# Patient Record
Sex: Male | Born: 1935 | Race: White | Hispanic: No | Marital: Married | Smoking: Never smoker
Health system: Southern US, Community
[De-identification: ages and names within clinical notes are randomized; demographics above are authoritative.]

## PROBLEM LIST (undated history)

## (undated) DIAGNOSIS — I2510 Atherosclerotic heart disease of native coronary artery without angina pectoris: Secondary | ICD-10-CM

## (undated) DIAGNOSIS — I5022 Chronic systolic (congestive) heart failure: Secondary | ICD-10-CM

## (undated) DIAGNOSIS — I714 Abdominal aortic aneurysm, without rupture, unspecified: Secondary | ICD-10-CM

## (undated) DIAGNOSIS — E785 Hyperlipidemia, unspecified: Secondary | ICD-10-CM

## (undated) DIAGNOSIS — K5090 Crohn's disease, unspecified, without complications: Secondary | ICD-10-CM

## (undated) DIAGNOSIS — I10 Essential (primary) hypertension: Secondary | ICD-10-CM

## (undated) DIAGNOSIS — R001 Bradycardia, unspecified: Secondary | ICD-10-CM

## (undated) DIAGNOSIS — R55 Syncope and collapse: Secondary | ICD-10-CM

## (undated) DIAGNOSIS — I255 Ischemic cardiomyopathy: Secondary | ICD-10-CM

## (undated) DIAGNOSIS — I739 Peripheral vascular disease, unspecified: Secondary | ICD-10-CM

## (undated) DIAGNOSIS — R000 Tachycardia, unspecified: Secondary | ICD-10-CM

## (undated) DIAGNOSIS — I701 Atherosclerosis of renal artery: Secondary | ICD-10-CM

## (undated) DIAGNOSIS — I472 Ventricular tachycardia, unspecified: Secondary | ICD-10-CM

## (undated) DIAGNOSIS — M109 Gout, unspecified: Secondary | ICD-10-CM

## (undated) DIAGNOSIS — I6529 Occlusion and stenosis of unspecified carotid artery: Secondary | ICD-10-CM

## (undated) DIAGNOSIS — I4510 Unspecified right bundle-branch block: Secondary | ICD-10-CM

## (undated) DIAGNOSIS — J449 Chronic obstructive pulmonary disease, unspecified: Secondary | ICD-10-CM

## (undated) HISTORY — DX: Chronic systolic (congestive) heart failure: I50.22

## (undated) HISTORY — DX: Occlusion and stenosis of unspecified carotid artery: I65.29

## (undated) HISTORY — DX: Bradycardia, unspecified: R00.1

## (undated) HISTORY — PX: EYE SURGERY: SHX253

## (undated) HISTORY — DX: Ischemic cardiomyopathy: I25.5

## (undated) HISTORY — PX: COLON SURGERY: SHX602

## (undated) HISTORY — DX: Chronic obstructive pulmonary disease, unspecified: J44.9

## (undated) HISTORY — DX: Essential (primary) hypertension: I10

## (undated) HISTORY — DX: Atherosclerotic heart disease of native coronary artery without angina pectoris: I25.10

## (undated) HISTORY — DX: Atherosclerosis of renal artery: I70.1

## (undated) HISTORY — DX: Crohn's disease, unspecified, without complications: K50.90

## (undated) HISTORY — PX: CORONARY ANGIOPLASTY: SHX604

## (undated) HISTORY — PX: TONSILLECTOMY: SUR1361

## (undated) HISTORY — DX: Hyperlipidemia, unspecified: E78.5

## (undated) HISTORY — PX: CATARACT EXTRACTION, BILATERAL: SHX1313

## (undated) HISTORY — PX: CORONARY ARTERY BYPASS GRAFT: SHX141

---

## 1985-10-09 HISTORY — PX: PR VEIN BYPASS GRAFT,AORTO-FEM-POP: 35551

## 2002-08-09 ENCOUNTER — Encounter: Payer: Self-pay | Admitting: Cardiology

## 2002-08-28 ENCOUNTER — Ambulatory Visit (HOSPITAL_COMMUNITY): Admission: RE | Admit: 2002-08-28 | Discharge: 2002-08-29 | Payer: Self-pay | Admitting: Cardiology

## 2002-09-11 ENCOUNTER — Ambulatory Visit (HOSPITAL_COMMUNITY): Admission: RE | Admit: 2002-09-11 | Discharge: 2002-09-11 | Payer: Self-pay | Admitting: *Deleted

## 2005-08-14 ENCOUNTER — Emergency Department (HOSPITAL_COMMUNITY): Admission: EM | Admit: 2005-08-14 | Discharge: 2005-08-14 | Payer: Self-pay | Admitting: Family Medicine

## 2005-09-02 ENCOUNTER — Emergency Department (HOSPITAL_COMMUNITY): Admission: EM | Admit: 2005-09-02 | Discharge: 2005-09-02 | Payer: Self-pay | Admitting: Family Medicine

## 2006-05-11 HISTORY — PX: COLON RESECTION: SHX5231

## 2006-07-26 ENCOUNTER — Inpatient Hospital Stay (HOSPITAL_BASED_OUTPATIENT_CLINIC_OR_DEPARTMENT_OTHER): Admission: RE | Admit: 2006-07-26 | Discharge: 2006-07-26 | Payer: Self-pay | Admitting: Cardiology

## 2006-07-28 ENCOUNTER — Encounter: Payer: Self-pay | Admitting: Cardiology

## 2006-08-13 ENCOUNTER — Ambulatory Visit: Payer: Self-pay | Admitting: Cardiothoracic Surgery

## 2006-08-19 ENCOUNTER — Ambulatory Visit: Payer: Self-pay | Admitting: Cardiothoracic Surgery

## 2006-08-19 ENCOUNTER — Inpatient Hospital Stay (HOSPITAL_COMMUNITY): Admission: RE | Admit: 2006-08-19 | Discharge: 2006-09-04 | Payer: Self-pay | Admitting: Cardiothoracic Surgery

## 2006-08-26 ENCOUNTER — Encounter (INDEPENDENT_AMBULATORY_CARE_PROVIDER_SITE_OTHER): Payer: Self-pay | Admitting: *Deleted

## 2006-09-28 ENCOUNTER — Ambulatory Visit: Payer: Self-pay | Admitting: Cardiothoracic Surgery

## 2006-10-14 ENCOUNTER — Encounter (HOSPITAL_COMMUNITY): Admission: RE | Admit: 2006-10-14 | Discharge: 2007-01-12 | Payer: Self-pay | Admitting: Cardiology

## 2007-01-13 ENCOUNTER — Encounter (HOSPITAL_COMMUNITY): Admission: RE | Admit: 2007-01-13 | Discharge: 2007-02-08 | Payer: Self-pay | Admitting: Cardiology

## 2007-05-26 ENCOUNTER — Ambulatory Visit: Payer: Self-pay | Admitting: *Deleted

## 2008-04-12 ENCOUNTER — Ambulatory Visit: Payer: Self-pay | Admitting: *Deleted

## 2008-05-11 HISTORY — PX: CAROTID ENDARTERECTOMY: SUR193

## 2008-05-14 ENCOUNTER — Inpatient Hospital Stay (HOSPITAL_COMMUNITY): Admission: AD | Admit: 2008-05-14 | Discharge: 2008-05-17 | Payer: Self-pay | Admitting: Cardiology

## 2008-10-16 ENCOUNTER — Emergency Department (HOSPITAL_COMMUNITY): Admission: EM | Admit: 2008-10-16 | Discharge: 2008-10-16 | Payer: Self-pay | Admitting: Family Medicine

## 2008-11-01 ENCOUNTER — Ambulatory Visit: Payer: Self-pay | Admitting: *Deleted

## 2008-11-20 ENCOUNTER — Inpatient Hospital Stay (HOSPITAL_COMMUNITY): Admission: RE | Admit: 2008-11-20 | Discharge: 2008-11-22 | Payer: Self-pay | Admitting: *Deleted

## 2008-11-20 ENCOUNTER — Encounter (INDEPENDENT_AMBULATORY_CARE_PROVIDER_SITE_OTHER): Payer: Self-pay | Admitting: *Deleted

## 2008-11-20 ENCOUNTER — Ambulatory Visit: Payer: Self-pay | Admitting: *Deleted

## 2008-12-06 ENCOUNTER — Ambulatory Visit: Payer: Self-pay | Admitting: *Deleted

## 2010-02-11 ENCOUNTER — Ambulatory Visit: Payer: Self-pay | Admitting: Cardiology

## 2010-03-25 ENCOUNTER — Ambulatory Visit: Payer: Self-pay | Admitting: Cardiology

## 2010-04-24 ENCOUNTER — Ambulatory Visit: Payer: Self-pay | Admitting: Cardiology

## 2010-08-17 LAB — COMPREHENSIVE METABOLIC PANEL
ALT: 21 U/L (ref 0–53)
Albumin: 3.9 g/dL (ref 3.5–5.2)
Alkaline Phosphatase: 81 U/L (ref 39–117)
Calcium: 9.3 mg/dL (ref 8.4–10.5)
Glucose, Bld: 100 mg/dL — ABNORMAL HIGH (ref 70–99)
Potassium: 4.5 mEq/L (ref 3.5–5.1)
Sodium: 139 mEq/L (ref 135–145)
Total Protein: 6.5 g/dL (ref 6.0–8.3)

## 2010-08-17 LAB — BASIC METABOLIC PANEL
CO2: 26 mEq/L (ref 19–32)
Calcium: 8.4 mg/dL (ref 8.4–10.5)
Creatinine, Ser: 1.36 mg/dL (ref 0.4–1.5)
GFR calc non Af Amer: 51 mL/min — ABNORMAL LOW (ref >60)
Glucose, Bld: 139 mg/dL — ABNORMAL HIGH (ref 70–99)

## 2010-08-17 LAB — CBC
MCHC: 34.6 g/dL (ref 30.0–36.0)
MCHC: 34.8 g/dL (ref 30.0–36.0)
MCV: 101 fL — ABNORMAL HIGH (ref 78.0–100.0)
MCV: 99.4 fL (ref 78.0–100.0)
Platelets: 114 10*3/uL — ABNORMAL LOW (ref 150–400)
Platelets: 140 10*3/uL — ABNORMAL LOW (ref 150–400)
RBC: 4.02 MIL/uL — ABNORMAL LOW (ref 4.22–5.81)
RDW: 13.1 % (ref 11.5–15.5)
RDW: 13.4 % (ref 11.5–15.5)

## 2010-08-17 LAB — URINALYSIS, ROUTINE W REFLEX MICROSCOPIC
Ketones, ur: NEGATIVE mg/dL
Nitrite: NEGATIVE
Protein, ur: NEGATIVE mg/dL
pH: 6 (ref 5.0–8.0)

## 2010-08-17 LAB — TYPE AND SCREEN: ABO/RH(D): O NEG

## 2010-08-17 LAB — APTT: aPTT: 33 seconds (ref 24–37)

## 2010-08-17 LAB — CARDIAC PANEL(CRET KIN+CKTOT+MB+TROPI): Total CK: 97 U/L (ref 7–232)

## 2010-08-25 LAB — COMPREHENSIVE METABOLIC PANEL
AST: 30 U/L (ref 0–37)
BUN: 17 mg/dL (ref 6–23)
CO2: 28 mEq/L (ref 19–32)
Chloride: 105 mEq/L (ref 96–112)
Creatinine, Ser: 1.19 mg/dL (ref 0.4–1.5)
GFR calc non Af Amer: 59 mL/min — ABNORMAL LOW (ref >60)
Glucose, Bld: 94 mg/dL (ref 70–99)
Total Bilirubin: 0.6 mg/dL (ref 0.3–1.2)

## 2010-08-25 LAB — BASIC METABOLIC PANEL
BUN: 21 mg/dL (ref 6–23)
Calcium: 8.7 mg/dL (ref 8.4–10.5)
GFR calc non Af Amer: 51 mL/min — ABNORMAL LOW (ref >60)
Glucose, Bld: 89 mg/dL (ref 70–99)
Potassium: 4.5 mEq/L (ref 3.5–5.1)
Sodium: 138 mEq/L (ref 135–145)

## 2010-08-25 LAB — CARDIAC PANEL(CRET KIN+CKTOT+MB+TROPI)
Relative Index: 3.3 — ABNORMAL HIGH (ref 0.0–2.5)
Relative Index: 3.5 — ABNORMAL HIGH (ref 0.0–2.5)
Relative Index: 3.5 — ABNORMAL HIGH (ref 0.0–2.5)
Total CK: 105 U/L (ref 7–232)
Total CK: 115 U/L (ref 7–232)
Troponin I: 0.02 ng/mL (ref 0.00–0.06)

## 2010-08-25 LAB — URINALYSIS, ROUTINE W REFLEX MICROSCOPIC
Glucose, UA: NEGATIVE mg/dL
Hgb urine dipstick: NEGATIVE
Protein, ur: NEGATIVE mg/dL

## 2010-08-25 LAB — CBC
HCT: 41.3 % (ref 39.0–52.0)
HCT: 41.5 % (ref 39.0–52.0)
Hemoglobin: 14.1 g/dL (ref 13.0–17.0)
Hemoglobin: 14.2 g/dL (ref 13.0–17.0)
Platelets: 119 10*3/uL — ABNORMAL LOW (ref 150–400)
Platelets: 126 10*3/uL — ABNORMAL LOW (ref 150–400)
Platelets: 128 10*3/uL — ABNORMAL LOW (ref 150–400)
RBC: 4.29 MIL/uL (ref 4.22–5.81)
RDW: 12.9 % (ref 11.5–15.5)
RDW: 13 % (ref 11.5–15.5)
WBC: 6.5 10*3/uL (ref 4.0–10.5)
WBC: 7.1 10*3/uL (ref 4.0–10.5)
WBC: 7.2 10*3/uL (ref 4.0–10.5)

## 2010-08-25 LAB — PROTIME-INR: INR: 1 (ref 0.00–1.49)

## 2010-08-25 LAB — HEPARIN LEVEL (UNFRACTIONATED): Heparin Unfractionated: 0.68 IU/mL (ref 0.30–0.70)

## 2010-08-25 LAB — GLUCOSE, CAPILLARY: Glucose-Capillary: 136 mg/dL — ABNORMAL HIGH (ref 70–99)

## 2010-09-23 NOTE — Discharge Summary (Signed)
NAMEAVEREY, TROMPETER NO.:  [id number]   MEDICAL RECORD NO.:  [id number]          PATIENT TYPE:  INP   LOCATION:  2011                         FACILITY:  [facility]   PHYSICIAN:  Colleen Can. Deborah Chalk, M.D.DATE OF BIRTH:  [birth year]   DATE OF ADMISSION:  05/14/2008  DATE OF DISCHARGE:  05/17/2008                               DISCHARGE SUMMARY   DISCHARGE DIAGNOSES:  1. Chest pain with subsequent cardiac catheterization, per Dr. Lady Deutscher, bypass grafts were noted to be patent.  He has had negative      CT scan.  He will continue with medical management.  2. Known ischemic heart disease with original myocardial infarction in      1969, subsequent bypass surgery x3 in 1987 with redo surgery in      April 2008.  3. History of postoperative atrial fibrillation, treated with a short      course of amiodarone without recurrence.  4. Postoperative colon resection in 2008 secondary to Crohn disease.  5. History of bradycardia secondary to beta-blocker.  6. Hypertension.  7. Hyperlipidemia.  8. Vasculopathy.  9. Sexual dysfunction.  His Viagra will now need to be discontinued.  10.Past history of anemia.   HISTORY OF PRESENT ILLNESS:  The patient is a 77 year old white male who  has multiple medical problems who presented to the office with a 10-day  history of chest discomfort.  He was subsequently seen and was admitted  for further evaluation.   Please see dictated history and physical for further patient  presentation and profile.   LABORATORY DATA ON ADMISSION:  CBC was within normal limits.  Chemistries were normal.  Cardiac enzymes were all negative.   HOSPITAL COURSE:  The patient was admitted electively.  He was started  on IV nitroglycerin and IV heparin.  His pain subsequently was resolved.  He proceeded on with cardiac catheterization the following day per Dr.  Lady Deutscher.  Please see that full dictated report for full details.  Basically, the  bypass grafts are noted to be patent and he will need to  continue with aggressive medical management.  Postprocedure, he was  transferred back to 1000, IV medications were discontinued, Ranexa was  initiated.  Unfortunately, he continued to have chest tightness that  returned the following morning.  Long-acting nitrates as well as  anticoagulants with Plavix was initiated.  CT scan was also performed,  which showed no acute changes.  Today on May 17, 2008, he is doing  well without complaints.  He had had some previous trouble with  hypotension the previous night of which he was asymptomatic.  Medicines  have been adjusted accordingly, and overall, he is felt to be a  satisfactory candidate for discharge today.   DISCHARGE CONDITION:  Stable.   DISCHARGE DIET:  Low salt, heart healthy.   DISCHARGE MEDICINES:  1. Lipitor 10 mg a day.  2. Baby aspirin daily.  3. Allegra 180 a day.  4. Niaspan 500 mg a day.  5. Multivitamin daily.  6. Lysine daily.  7. Protonix 40 mg a  day.  8. Ranexa 500 b.i.d.  9. Imdur 60 mg a day.  10.Plavix 75 mg a day.   We will, at this point in time, hold his Diovan, and he is not to take  any further Viagra.  We will plan on seeing him back in the office early  next week.  He is asked to call to schedule that appointment, certainly  sooner if any problems arise in the interim.      Sharlee Blew, N.P.      Colleen Can. Deborah Chalk, M.D.  Electronically Signed    LC/MEDQ  D:  05/17/2008  T:  05/17/2008  Job:  [id number]

## 2010-09-23 NOTE — Procedures (Signed)
CAROTID DUPLEX EXAM   INDICATION:  Followup evaluation of known carotid artery disease.   HISTORY:  Diabetes:  No.  Cardiac:  CABG.  Hypertension:  Yes.  Smoking:  No.  Previous Surgery:  No.  CV History:  No.  Amaurosis Fugax No, Paresthesias No, Hemiparesis No                                       RIGHT             LEFT  Brachial systolic pressure:         116               122  Brachial Doppler waveforms:         Biphasic          Biphasic  Vertebral direction of flow:        Antegrade         Antegrade  DUPLEX VELOCITIES (cm/sec)  CCA peak systolic                   122               110  ECA peak systolic                   240               236  ICA peak systolic                   198               397  ICA end diastolic                   34                127  PLAQUE MORPHOLOGY:                  Heterogeneous     Heterogeneous  PLAQUE AMOUNT:                      Moderate          Severe  PLAQUE LOCATION:                    Bif/ICA/ECA       Bif/ICA/ECA   IMPRESSION:  1. 20-39% right internal carotid artery stenosis.  2. 80-99% left internal carotid artery stenosis.        ___________________________________________  P. Liliane Bade, M.D.   AC/MEDQ  D:  11/01/2008  T:  11/01/2008  Job:  [id number]

## 2010-09-23 NOTE — H&P (Signed)
NAMEZACHERIAH, STUMPE NO.:  [id number]   MEDICAL RECORD NO.:  [id number]          PATIENT TYPE:  INP   LOCATION:  2924                         FACILITY:  [facility]   PHYSICIAN:  Colleen Can. Deborah Chalk, M.D.DATE OF BIRTH:  1927-01-17   DATE OF ADMISSION:  05/14/2008  DATE OF DISCHARGE:                              HISTORY & PHYSICAL   CHIEF COMPLAINT:  Chest pain.   HISTORY OF PRESENT ILLNESS:  Mr. Noecker is a very pleasant 77 year old  white male who has a known history of ischemic heart disease.  He has  had redo coronary artery bypass grafting in April of 2008.  He presents  to the office as a work-in appointment with a 10-day history of chest  discomfort.  He notes that ever since he has had his redo surgery almost  2 years ago he has had some soreness throughout the chest wall that  really has not bothered him.  He notes that moves around and it is  clearly nonexertional and it feels like it is more musculoskeletal.  Approximately 10 days ago he thought he had the onset of this again,  however, this time it is midsternal, it is a nagging type discomfort, it  is worse with his activities of daily living and is relieved with rest.  He has used nitroglycerin on two different occasions with some relief  yet it would only return.  It does feel similar to his previous chest  pain syndrome.  He was seen in the office as a work-in appointment and  had been complaining of this discomfort for the past several hours.  He  was given nitroglycerin here in the office with really no improvement.  He is subsequently admitted now for further evaluation.   PAST MEDICAL HISTORY:  1. Known ischemic heart disease.  He had his original myocardial      infarction (apical) in 1969.  He had subsequent coronary artery      bypass grafting x3 in 1987, at that time had a vein graft to the      diagonal and marginal coronaries with a left internal mammary to      the LAD.  He had repeat  catheterization in March of 2008 and      subsequently underwent redo coronary artery bypass grafting x3 with      a left radial artery graft to the LAD, sequential saphenous vein      graft to the OM1 and OM2 per Dr. Kathlee Nations Trigt in April of 2008.  2. Postoperative atrial fibrillation.  This was short-term and treated      with a short course of amiodarone.  3. Postoperative colon resection secondary to Crohn's disease.  4. History of bradycardia secondary to beta blocker.  5. Hypertension.  6. Hyperlipidemia.  7. Vasculopathy with multiple bruits, he is followed by Dr. Liliane Bade.  He has most recently had carotid duplex study in December      of 2009.  He has a 1% - 39% stenosis on the right and a 60% - 80%  stenosis on the left.  8. Sexual dysfunction requiring Viagra.  9. Past history of anemia.   ALLERGIES:  NOVOCAIN.   CURRENT MEDICINES:  1. Lipitor 10 mg a day.  2. Baby aspirin daily.  3. Diovan 80 mg a day.  4. Nitroglycerin p.r.n.  5. Viagra p.r.n.  6. Allegra 180 a day.  7. Niaspan 500 a day.  8. Lysine daily.  9. Multivitamin daily.   FAMILY HISTORY:  Both of his parents are deceased.  Father died at 62  with heart disease and emphysema.  Mother died at age 79 with lung  problems.   SOCIAL HISTORY:  He is married.  He lives at home with his wife.  He has  no current alcohol or tobacco use.   REVIEW OF SYSTEMS:  He has had no recent fever, flu or cough.  He is not  short of breath.  His energy level has remained good.  His chest  discomfort is as described above.  He has had no GI upset, no  constipation or diarrhea.  All other review of systems are negative.   EXAM:  He is a very pleasant white male, he is in no acute distress but  he is still complaining of midsternal chest discomfort.  Blood pressure  is 112/72, his heart rate initially in the 70s but then up to 130s  during the exam, respirations are 20, he is afebrile.  HEENT:  He is  normocephalic/atraumatic.  NECK:  Supple, no JVD.  LUNGS:  Clear.  CARDIAC:  Shows a regular rhythm, there is no chest wall tenderness.  ABDOMEN:  Soft, positive bowel sounds, nontender.  EXTREMITIES:  Without edema.  NEUROLOGIC:  Shows no gross focal deficits.   His EKG initially showed sinus rhythm with a heart rate in the 70s with  evidence of an old anterior MI.  A repeat EKG when he was noted to be  tachycardic showed a rate of 108 with no further changes.  His other  labs are pending.   OVERALL IMPRESSION:  1. Persistent chest discomfort.  2. Extensive history of ischemic heart disease.  3. Vasculopathy.  4. Hypertension.  5. Hyperlipidemia.  6. History of redo coronary artery bypass grafting last in April of      2008.  7. Postoperative colon resection secondary to Crohn's disease.  8. Remote history of atrial fibrillation.  9. History of bradycardia secondary to beta blocker use.   PLAN:  He is admitted to the hospital.  He will be placed on IV heparin  and IV nitroglycerin.  Home medicines will be continued.  For now will  hold off on starting beta blockers.  We will watch his rhythm on the  monitor.  We will do serial cardiac enzymes.  We will more than likely  proceed on with cardiac catheterization on Tuesday.  The further  treatment plan to follow per Dr. Deborah Chalk and Dr. Silva Bandy discretion.      Sharlee Blew, N.P.      Colleen Can. Deborah Chalk, M.D.  Electronically Signed    LC/MEDQ  D:  05/14/2008  T:  05/14/2008  Job:  [id number]

## 2010-09-23 NOTE — Op Note (Signed)
Karl Stevenson NO.:  [id number]   MEDICAL RECORD NO.:  [id number]          PATIENT TYPE:  INP   LOCATION:  3308                         FACILITY:  [facility]   PHYSICIAN:  Balinda Quails, M.D.    DATE OF BIRTH:  1927-03-14   DATE OF PROCEDURE:  11/20/2008  DATE OF DISCHARGE:                               OPERATIVE REPORT   SURGEON:  Balinda Quails, MD   ASSISTANT:  Della Goo, PA-C   ANESTHETIC:  General endotracheal.   ANESTHESIOLOGIST:  Maren Beach, MD   PREOPERATIVE DIAGNOSIS:  Severe progressive left internal carotid artery  stenosis.   POSTOPERATIVE DIAGNOSIS:  Severe progressive left internal carotid  artery stenosis.   PROCEDURE:  Left carotid endarterectomy with Dacron patch angioplasty.   CLINICAL NOTE:  [name] is a 77 year old gentleman followed for  several years in the office with known carotid disease.  He has had  marked progression of his left internal carotid artery stenosis with  increase in velocities consistent with a severe left internal carotid  stenosis.   He was seen in the office and evaluated and consented to left carotid  endarterectomy for reduction of stroke risk.  Risks and benefits of the  operative procedure reviewed with the patient preoperatively, potential  complications including MI, CVA, cranial nerve injury, and death were  addressed.   OPERATIVE PROCEDURE:  The patient was brought to the operating room in  stable condition.  Placed under general endotracheal anesthesia.  Foley  catheter arterial line was in place.  Left neck prepped and draped in  sterile fashion.   Longitudinal skin incision made along the upper border of the left  sternomastoid muscle.  Subcutaneous tissue and platysma divided with  electrocautery.  Deep dissection carried along the anterior border of  the sternomastoid.  The facial vein ligated with 2-0 silk and divided.  The carotid bifurcation exposed.  The common carotid  artery mobilized  down to the omohyoid muscle and encircled proximally with a vessel loop.  The superior thyroid and external carotid origins freed and encircled  with vessel loops.  The internal carotid followed distally up to the  posterior belly of the digastric muscle, the hypoglossal nerve reflected  superiorly and preserved.  The distal internal carotid artery was free  of plaque and encircled with a vessel loop.  The carotid bifurcation  revealed calcified plaque disease.  The vagus nerve reflected  posteriorly and preserved.   The patient was administered 6000 units of heparin intravenously.  Adequate circulation time permitted.  The carotid vessels were  controlled with clamps.  Longitudinal arteriotomy made in the distal  common carotid artery.  The arteriotomy extended across the carotid bulb  and up into the internal carotid artery.  There was a moderate plaque in  the left carotid bifurcation and a high-grade stenosis at the origin of  left internal carotid artery.  Shunt was inserted.   The endarterectomy then completed with an elevator.  The endarterectomy  carried down to the common carotid artery with a plaque was divided  transversely with Potts scissors.  Plaque raised up into the bulb and  superior thyroid and external carotid were endarterectomized using an  eversion technique.  The distal internal carotid artery plaque feathered  out well.  Fragments of plaque removed with fine forceps.  The site was  irrigated with heparin, saline, and dextran solutions.   A patch angioplasty endarterectomy site was then carried out with a  running 6-0 Prolene suture using a Dacron patch.  At completion of the  patch angioplasty, the shunt was removed, all vessels well flushed.  Clamps removed directing initial antegrade flow up the external carotid  artery, the internal carotid then released.  Excellent pulse and Doppler  signal in the distal internal carotid artery.  The  patient administered  50 mg of protamine intravenously.  Adequate hemostasis obtained, sponge  and instrument counts correct.   The sternomastoid fascia closed with running 2-0 Vicryl suture.  Platysma closed with running 3-0 Vicryl suture.  Skin closed with 4-0  Monocryl.  Dermabond applied.   Anesthesia reversed in the operating room.  The patient awakened  readily, moved all extremities to command.  Transferred to recovery room  in stable condition.      Balinda Quails, M.D.  Electronically Signed     PGH/MEDQ  D:  11/20/2008  T:  11/20/2008  Job:  [id number]   cc:   Colleen Can. Deborah Chalk, M.D.

## 2010-09-23 NOTE — Cardiovascular Report (Signed)
NAMEYOVANI, COGBURN NO.:  [id number]   MEDICAL RECORD NO.:  [id number]          PATIENT TYPE:  INP   LOCATION:  2011                         FACILITY:  [facility]   PHYSICIAN:  Elmore Guise., M.D.DATE OF BIRTH:  06-Jun-1926   DATE OF PROCEDURE:  05/15/2008  DATE OF DISCHARGE:                            CARDIAC CATHETERIZATION   PRIMARY CARDIOLOGIST:  Colleen Can. Deborah Chalk, MD   INDICATION FOR CATHETERIZATION:  Increasing exertional angina.   DESCRIPTION OF PROCEDURE:  The patient was brought to the Cardiac Cath  Lab.  After appropriate informed consent, he was prepped and draped in  sterile fashion.  Approximately 5 mL of 1% lidocaine was used for local  anesthesia.  A 5-French sheath was placed in the right femoral artery  without difficulty.  Coronary angiography, LV angiography, aortic root  angiography, and abdominal aortic angiography were performed.  The  patient tolerated the procedure well with no apparent complications.   CONTRAST USED DURING THE PROCEDURE:  Approximately 180 mL.   FINDINGS:  1. Left Main:  Very small 80-90% narrowing throughout.  2. LAD:  A 100% proximal occlusion.  3. LCX:  A 100% proximal occlusion at prior stent.  4. RCA:  Small and nondominant.   VEIN GRAFTS:  1. Vein graft to OM/OM-2 (sequential) is patent without significant      disease.  There is a second branch extending from the vein graft      filling the mid and distal LAD and backfilling the proximal LAD and      the first diagonal.  2. Vein graft to OM-3 is patent with mild distal disease approximately      30-40%.  3. Left subclavian artery has a 40-50% proximal stenosis, but LIMA is      atretic.  4. LV:  EF is 40%.  LVEDP is 7 mmHg.  5. Aortic root shows mild proximal ectasia with no further bypass      graft seen.  6. Descending aortic root (abdominal aortic shot) shows an ectatic      vessel with mild diffuse disease.  There is a 70-80% left renal  artery stenosis noted.  Right and left iliacs are patent with      moderate diffuse disease noted.   IMPRESSION:  1. Severe obstructive native vessel disease.  2. Patent vein graft to first obtuse marginal/second obtuse marginal      (sequential graft).  Patent vein graft to left anterior descending      and patent vein graft to third obtuse marginal.  3. 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  4. A 70-80% stenosis of the left renal artery.  5. Moderate diffuse iliac and femoral atherosclerosis.   PLAN:  At this time, I would recommend aggressive medical therapy.  We  will add Ranexa 500 mg twice daily to his regimen.  We will discontinue  his nitroglycerin drip.  Hopefully, he will be able to be discharged in  the morning.      Elmore Guise., M.D.  Electronically Signed     TWK/MEDQ  D:  05/15/2008  T:  05/16/2008  Job:  [id number]   cc:   Colleen Can. Deborah Chalk, M.D.

## 2010-09-23 NOTE — Procedures (Signed)
CAROTID DUPLEX EXAM   INDICATION:  Followup carotid artery disease.   HISTORY:  Diabetes:  No.  Cardiac:  CABG.  Hypertension:  Yes.  Smoking:  No.  Previous Surgery:  No.  CV History:  No.  Amaurosis Fugax No, Paresthesias No, Hemiparesis No                                       RIGHT             LEFT  Brachial systolic pressure:         154               146  Brachial Doppler waveforms:         Biphasic          Biphasic  Vertebral direction of flow:        Antegrade         Antegrade  DUPLEX VELOCITIES (cm/sec)  CCA peak systolic                   87                95  ECA peak systolic                   198               184  ICA peak systolic                   101               303  ICA end diastolic                   28                101  PLAQUE MORPHOLOGY:                  Mixed             Calcified  PLAQUE AMOUNT:                      Mild              Moderate/severe  PLAQUE LOCATION:                    Bifurcation/ECA  ICA/ECA/bifurcation   IMPRESSION:  1. Right ICA velocities are suggestive of 1-39% stenosis, however,      most plaque appears to be in the bifurcation.  2. Left ICA shows evidence of 60-79% stenosis, with main portion of      plaque in the bifurcation.  3. Bilateral ECA stenosis.  4. No significant changes from previous study.   ___________________________________________  P. Liliane Bade, M.D.   AS/MEDQ  D:  04/12/2008  T:  04/12/2008  Job:  [id number]

## 2010-09-23 NOTE — Discharge Summary (Signed)
NAMEBREANDAN, PEOPLE NO.:  [id number]   MEDICAL RECORD NO.:  [id number]          PATIENT TYPE:  INP   LOCATION:  2040                         FACILITY:  [facility]   PHYSICIAN:  Balinda Quails, M.D.    DATE OF BIRTH:  1926/05/15   DATE OF ADMISSION:  11/20/2008  DATE OF DISCHARGE:  11/22/2008                               DISCHARGE SUMMARY   FINAL DISCHARGE DIAGNOSES:  1. Severe progressive left internal carotid artery occlusive disease.  2. Peripheral vascular disease.  3. Hypertension.  4. Ischemic heart disease.  5. Hypercholesterolemia.   PROCEDURES PERFORMED:  Left carotid endarterectomy with Dacron patch  angioplasty closure.   COMPLICATIONS:  None.   CONDITION AT DISCHARGE:  Stable and improving.   DISCHARGE MEDICATIONS:  1. Lipitor 10 mg p.o. at bedtime.  2. Diovan 80 mg p.o. daily.  3. Aspirin 81 mg p.o. daily.  4. Niaspan 500 mg p.o. at bedtime.  5. Protonix 40 mg p.o. daily.  6. Ranexa 500 mg p.o. b.i.d.  7. Isosorbide 60 mg p.o. daily.  8. Plavix 75 mg p.o. daily.  9. Viagra 100 mg p.r.n.  10.Ipratropium bromide 0.03% spray each nostril as needed.  11.Allegra 180 mg p.o. p.r.n.  12.NitroQuick 0.4 mg sublingual p.r.n. chest discomfort.  13.__________ 1 tablespoon p.o. daily.  14.L-lysine 500 mg p.o. daily.  15.Centrum multivitamin 1 tablet p.o. daily.  16.He is also given prescription for Percocet 5/325 one p.o. q.4 h.      p.r.n. pain, total #30 were given.   DISPOSITION:  He is being discharged home in stable condition with his  wounds healing well.  He is given careful instructions regarding care of  his wounds and activity level.  He is given a return appointment with  Dr. Madilyn Fireman in 2-3 weeks.  He is to see Dr. Deborah Chalk as directed.   BRIEF IDENTIFYING STATEMENT:  For complete details, please refer the  typed history and physical.   Briefly, this very pleasant 77 year old gentleman has been followed by  Dr. Madilyn Fireman for carotid occlusive  disease.  He has had a significant  narrowing of his left carotid artery.  Dr. Madilyn Fireman recommends left carotid  endarterectomy for stroke prevention.  He was informed of the risks and  benefits of the procedure and after careful consideration, elected to  proceed with surgery.   HOSPITAL COURSE:  Preoperative workup was completed as an outpatient.  He was brought in through same-day surgery and underwent the  aforementioned left carotid endarterectomy.  For complete details,  please refer the typed operative report.  The procedure was without  complications.  He was returned to the Post Anesthesia Care Unit  extubated.  Following stabilization, he was transferred to a bed on a  surgical step-down unit.  He was observed overnight.  In the 9 hours on  the day of surgery, he experienced some cardiac arrhythmias consistent  primarily of bradycardia and junctional rhythm.  He converted out of  these to a sinus rhythm.  Cardiac enzymes and EKG were obtained.  We did  ask Dr. Deborah Chalk to evaluate him and do  appreciate his diligent  assistance.   He was transferred to a bed on a surgical convalescent floor.  He was  observed for another 24 hours.  He had no further junctional rhythm,  although he did run a sinus bradycardia.  With Dr. Ronnald Nian  concurrence, he was discharged home.  At discharge, he was  neurologically intact.  Tongue was midline.  His smile was symmetrical  and he was neurologically nonfocal.       Wilmon Arms, PA      P. Liliane Bade, M.D.  Electronically Signed    KEL/MEDQ  D:  11/22/2008  T:  11/22/2008  Job:  [id number]

## 2010-09-23 NOTE — Procedures (Signed)
CAROTID DUPLEX EXAM   INDICATION:  Followup, carotid artery disease.   HISTORY:  Diabetes:  No.  Cardiac:  CABG.  Hypertension:  Yes.  Smoking:  No.  Previous Surgery:  No.  CV History:  No.  Amaurosis Fugax No, Paresthesias No, Hemiparesis No                                       RIGHT             LEFT  Brachial systolic pressure:         132               128  Brachial Doppler waveforms:         Biphasic          Biphasic  Vertebral direction of flow:        Antegrade         Antegrade  DUPLEX VELOCITIES (cm/sec)  CCA peak systolic                   82                87  ECA peak systolic                   138               195  ICA peak systolic                   85                303  ICA end diastolic                   26                94  PLAQUE MORPHOLOGY:                  Calcific in ECA   Calcific  PLAQUE AMOUNT:                      Mild in ECA       Moderate/severe  PLAQUE LOCATION:                    ECA  ICA/ECA/bifurcation   IMPRESSION:  1. The right internal carotid artery shows no evidence of stenosis.  2. The left internal carotid artery shows evidence of 60-79% stenosis.  3. Left external carotid artery stenosis.  4. No significant changes from previous study.   ___________________________________________  P. Liliane Bade, M.D.   AS/MEDQ  D:  05/26/2007  T:  05/26/2007  Job:  [id number]

## 2010-09-23 NOTE — Assessment & Plan Note (Signed)
OFFICE VISIT   Karl Stevenson, Karl Stevenson  DOB:  August 31, 1926                                       11/01/2008  [id number]#:[id number]   The patient has been followed in the office since 1993 with a left  carotid bruit.  He underwent surveillance duplex scan of his carotid  today and this reveals marked progression of his left internal carotid  artery stenosis.  Velocities measure 397/127 cm/s, he has heterogeneous  plaque noted, left carotid bifurcation.   The patient has no history of stroke.  He denies recent motor, sensory  or visual deficit.   He is on Plavix and aspirin.   EVALUATION TODAY:  He is a 77 year old gentleman, appears his stated  age.  Alert and oriented.  BP 127/68, pulse is 65 per minute.  No  carotid bruits.  Cranial nerves intact.  Strength equal bilaterally.  1+  reflexes.   The patient shows evidence of significant progression of left ICA  stenosis.  I have recommended left carotid endarterectomy for reduction  of stroke risk.  He is consented for surgery.  I discussed this with Dr.  Deborah Chalk today and he has given [place] permission to go ahead from a cardiac  standpoint.  He will stop his Plavix for a week prior to surgery.   Balinda Quails, M.D.  Electronically Signed   PGH/MEDQ  D:  11/01/2008  T:  11/02/2008  Job:  [id number]   cc:   Colleen Can. Deborah Chalk, M.D.

## 2010-09-23 NOTE — Assessment & Plan Note (Signed)
OFFICE VISIT   Karl Stevenson, Karl Stevenson  DOB:  01-18-1927                                       12/06/2008  [id number]#:[id number]   The patient underwent a left carotid endarterectomy for severe stenosis  on 11/20/2008.  Postoperatively his discharge was delayed due to some  bradycardia and junctional rhythm.  Cardiac enzymes and EKG were  obtained and were unremarkable.  He was evaluated by Dr. Deborah Chalk and  without further difficulty was discharged home on 11/22/2008.   His left neck is healing well.  Cranial nerves intact.  Strength equal  bilaterally.  BP is 107/61, pulse is 98 per minute.   Overall the patient is doing well following his recent surgery.  Will  plan followup with carotid Doppler in 6 months.   Balinda Quails, M.D.  Electronically Signed   PGH/MEDQ  D:  12/06/2008  T:  12/07/2008  Job:  [id number]   cc:   Colleen Can. Deborah Chalk, M.D.

## 2010-09-26 NOTE — Consult Note (Signed)
Karl Stevenson, Karl Stevenson NO.:  [id number]   MEDICAL RECORD NO.:  [id number]          PATIENT TYPE:  OIB   LOCATION:  1961                         FACILITY:  [facility]   PHYSICIAN:  Kerin Perna, M.D.  DATE OF BIRTH:  [birth year]   DATE OF CONSULTATION:  08/13/2006  DATE OF DISCHARGE:  07/26/2006                                 CONSULTATION   REQUESTING PHYSICIAN:  Colleen Can. Deborah Chalk, M.D.   PRIMARY CARE PHYSICIAN:  [name], M.D.   REASON FOR CONSULTATION:  Severe recurrent coronary artery disease with  class III angina.   CHIEF COMPLAINT:  Chest pain and shortness of breath with exertion.   HISTORY OF PRESENT ILLNESS:  I was asked to evaluate this 77 year old  white male who underwent prior bypass surgery in 1987 for potential redo  surgical coronary revascularization for recently diagnosed severe  recurrent coronary artery disease and reduced LV function.  If the  patient had a CABG x3 in 45 in [place], [place], at which  time a vein was harvested from both lower legs and the mammary artery  was used for grafts to the LAD, diagonal, and circumflex marginal.  Over  time, he developed graft disease and had atresia and occlusion of the  mammary artery graft to the LAD from probable collateral flow to the  vein graft to the diagonal.  The vein graft to the diagonal is now  occluded, and the vein graft to the circumflex OM I has a 50% stenosis.  The main circumflex vessel had a stent placed by Dr. Deborah Chalk in 2004,  and the patient has been on chronic Plavix.  His LV function is reduced  with an EF of 27% by MUGA scan.  His echo shows inferior apical and  posterolateral hypokinesia.  He does not have significant mitral  regurgitation or evidence of pulmonary hypertension.   The patient's symptoms are entirely exertional, and he denies any  resting or nocturnal pain.  He is able to walk half a mile rest and then  continue walking another half to  one mile.  He walks on a regular basis.   He states that he had no particular problem with the first operation in  [place] in 1987, no evidence of bleeding or wound infection.   Patient otherwise has no significant vascular disease, no history of  DVT, atrial fibrillation, cardiac murmur, and his carotid Doppler shows  a left 60-80% moderate stenosis of the left ICA.  This has been followed  by Dr. Madilyn Fireman over the years and has been stable.  His brachial artery  pressures are equal, and his palmar arch studies show no abnormalities.  He is a right-hand dominant individual.   PAST MEDICAL HISTORY:  1. Severe recurrent coronary artery disease with LVEF of 27% and class      III angina.  2. Hypertension.  3. Hyperlipidemia.  4. Left carotid moderate stenosis.  5. History of anemia.   ALLERGIES:  NOVOCAIN.   CURRENT MEDICATIONS:  1. Lipitor 10 mg.  2. Aspirin 81 mg.  3. Plavix 75 mg.  4. Diovan 160 mg.  5. Viagra p.r.n.  6. Allegra 180 mg.  7. Nitroglycerin p.r.n.   FAMILY HISTORY:  Positive for coronary artery disease and emphysema.   SOCIAL HISTORY:  He is retired from the [employer] and denies  tobacco or alcohol use.  He is married.   REVIEW OF SYSTEMS:  No constitutional symptoms of fever or weight loss.  ENT:  Negative for dental problems or difficulty swallowing.  THORACIC:  Negative for trauma or abnormal chest x-ray.  CARDIAC:  Positive for his  prior CABG and severe coronary disease.  He has no CHF symptoms.  GI:  Negative for hepatitis, jaundice, or blood per rectum.  UROLOGIC:  Negative for BPH or hematuria.  ENDOCRINE:  Negative for diabetes or  thyroid disease.  VASCULAR:  Negative for TIA or claudication.  NEUROLOGIC:  Negative for stroke or seizure.   PHYSICAL EXAMINATION:  VITAL SIGNS:  The patient is 5 feet 8 and weighs  150 pounds.  Blood pressure is 135/70, pulse 63, respirations 18,  saturation 98%.  GENERAL APPEARANCE:  A very pleasant,  elderly male in no distress,  accompanied by his wife in the office today.  HEENT:  Normocephalic.  Full EOMs.  Pharynx clear.  Dentition good.  NECK:  Supple without JVD or mass.  LYMPHATICS:  No palpable adenopathy.  THORACIC:  No deformity.  There is a well-healed sternal incision scar.  CARDIAC:  Regular without S3, S4, or murmur.  ABDOMEN:  Soft and nontender without pulsatile mass.  EXTREMITIES:  Palpable pulses.  VASCULAR:  He has no neck bruit.  He has no evidence of DVT.  He has a  healed right lower leg incision at the mid calf and then at the knee.  He has a left lower leg incision at the low tibial area.  SKIN:  Otherwise without rash or lesion.  NEUROLOGIC:  Intact.   LABORATORY DATA:  I reviewed the coronary arteriogram, which was  performed by Dr. Deborah Chalk in March.  He has a severe recurrent coronary  disease and high grade native vessel disease with a 78% left main  stenosis and chronic occlusion of the LAD, chronic occlusion of the OM I  with a patent graft to the OM I and an 80% stenosis of the distal OM II.  The LAD fills faintly from collaterals.  The right coronary artery is  small and nondominant.  An LV gram was not performed but by echo report,  his EF was approximately 30% without significant MR.   IMPRESSION/PLAN:  The patient appears to be a candidate for surgical  revascularization as a redo procedure.  We will need to stop his Plavix  preoperatively.  I discussed the details of the operation completely,  including the associated risks of bleeding, stroke, infection, MI, and  death.  At this point, he understands and agrees to proceed  with surgery, which I feel is his best option for improved long-term  survival and preservation of his LV.  The surgery will be scheduled for  Thursday, April 10th.   Thank you for the consultation.      Kerin Perna, M.D.  Electronically Signed     PV/MEDQ  D:  08/13/2006  T:  08/13/2006  Job:  [id number]   cc:    Colleen Can. Deborah Chalk, M.D.

## 2010-10-06 ENCOUNTER — Other Ambulatory Visit: Payer: Self-pay | Admitting: Cardiology

## 2010-10-21 ENCOUNTER — Other Ambulatory Visit: Payer: Self-pay | Admitting: Cardiology

## 2010-10-21 NOTE — Telephone Encounter (Signed)
Fax received from pharmacy. Refill completed. Jodette Janeah Kovacich RN  

## 2010-11-08 ENCOUNTER — Emergency Department (HOSPITAL_COMMUNITY): Payer: Medicare FFS

## 2010-11-08 ENCOUNTER — Inpatient Hospital Stay (HOSPITAL_COMMUNITY)
Admission: EM | Admit: 2010-11-08 | Discharge: 2010-11-09 | DRG: 313 | Disposition: A | Discharge status: Home / Self Care | Payer: Medicare FFS | Attending: Cardiology | Admitting: Cardiology

## 2010-11-08 DIAGNOSIS — R079 Chest pain, unspecified: Secondary | ICD-10-CM

## 2010-11-08 DIAGNOSIS — I739 Peripheral vascular disease, unspecified: Secondary | ICD-10-CM | POA: Diagnosis present

## 2010-11-08 DIAGNOSIS — I2589 Other forms of chronic ischemic heart disease: Secondary | ICD-10-CM | POA: Diagnosis present

## 2010-11-08 DIAGNOSIS — I2510 Atherosclerotic heart disease of native coronary artery without angina pectoris: Secondary | ICD-10-CM | POA: Diagnosis present

## 2010-11-08 DIAGNOSIS — I10 Essential (primary) hypertension: Secondary | ICD-10-CM | POA: Diagnosis present

## 2010-11-08 DIAGNOSIS — E785 Hyperlipidemia, unspecified: Secondary | ICD-10-CM | POA: Diagnosis present

## 2010-11-08 DIAGNOSIS — Z7902 Long term (current) use of antithrombotics/antiplatelets: Secondary | ICD-10-CM

## 2010-11-08 DIAGNOSIS — Z951 Presence of aortocoronary bypass graft: Secondary | ICD-10-CM

## 2010-11-08 DIAGNOSIS — Z7982 Long term (current) use of aspirin: Secondary | ICD-10-CM

## 2010-11-08 DIAGNOSIS — R0789 Other chest pain: Principal | ICD-10-CM | POA: Diagnosis present

## 2010-11-09 DIAGNOSIS — R079 Chest pain, unspecified: Secondary | ICD-10-CM

## 2010-11-09 LAB — POCT I-STAT, CHEM 8
BUN: 27 mg/dL — ABNORMAL HIGH (ref 6–23)
Calcium, Ion: 1.17 mmol/L (ref 1.12–1.32)
Chloride: 107 mEq/L (ref 96–112)
Creatinine, Ser: 1.6 mg/dL — ABNORMAL HIGH (ref 0.50–1.35)
Glucose, Bld: 106 mg/dL — ABNORMAL HIGH (ref 70–99)
HCT: 35 % — ABNORMAL LOW (ref 39.0–52.0)
Potassium: 4 mEq/L (ref 3.5–5.1)

## 2010-11-09 LAB — DIFFERENTIAL
Basophils Absolute: 0 10*3/uL (ref 0.0–0.1)
Eosinophils Relative: 1 % (ref 0–5)
Lymphocytes Relative: 18 % (ref 12–46)
Lymphs Abs: 1.1 10*3/uL (ref 0.7–4.0)
Neutro Abs: 4.7 10*3/uL (ref 1.7–7.7)

## 2010-11-09 LAB — CK TOTAL AND CKMB (NOT AT ARMC)
CK, MB: 5.7 ng/mL — ABNORMAL HIGH (ref 0.3–4.0)
Relative Index: 3.7 — ABNORMAL HIGH (ref 0.0–2.5)
Total CK: 155 U/L (ref 7–232)

## 2010-11-09 LAB — CBC
HCT: 35 % — ABNORMAL LOW (ref 39.0–52.0)
Hemoglobin: 12.4 g/dL — ABNORMAL LOW (ref 13.0–17.0)
MCV: 96.7 fL (ref 78.0–100.0)
RBC: 3.62 MIL/uL — ABNORMAL LOW (ref 4.22–5.81)
WBC: 6.4 10*3/uL (ref 4.0–10.5)

## 2010-11-09 LAB — CARDIAC PANEL(CRET KIN+CKTOT+MB+TROPI)
Relative Index: 4 — ABNORMAL HIGH (ref 0.0–2.5)
Total CK: 139 U/L (ref 7–232)
Total CK: 124 U/L (ref 7–232)

## 2010-11-09 LAB — MRSA PCR SCREENING: MRSA by PCR: NEGATIVE

## 2010-11-09 LAB — TROPONIN I: Troponin I: <0.3 ng/mL (ref <0.30)

## 2010-11-09 LAB — LIPID PANEL
HDL: 47 mg/dL (ref >39)
Total CHOL/HDL Ratio: 2.3 RATIO
VLDL: 13 mg/dL (ref 0–40)

## 2010-11-14 ENCOUNTER — Encounter: Payer: Self-pay | Admitting: *Deleted

## 2010-11-14 DIAGNOSIS — K5090 Crohn's disease, unspecified, without complications: Secondary | ICD-10-CM | POA: Insufficient documentation

## 2010-11-14 DIAGNOSIS — D649 Anemia, unspecified: Secondary | ICD-10-CM | POA: Insufficient documentation

## 2010-11-15 NOTE — H&P (Signed)
Karl Stevenson, SCHULKE NO.:  [id number]  MEDICAL RECORD NO.:  [id number]  LOCATION:  2915                         FACILITY:  [facility]  PHYSICIAN:  Natasha Bence, MD       DATE OF BIRTH:  May 19, 1926  DATE OF ADMISSION:  11/08/2010 DATE OF DISCHARGE:  11/09/2010                             HISTORY & PHYSICAL   CHIEF COMPLAINT:  Chest pain.  HISTORY OF PRESENT ILLNESS:  An 77 year old white male with history of known coronary artery disease with history of bypass surgery and also a redo bypass surgery in 2008, who presents this evening with chest discomfort.  He says he was getting ready for the bed and developed substernal chest discomfort, that was nonradiating, was not associated with any shortness of breath or nausea.  The pain lasted about 20 minutes and he took nitroglycerin with prompt relief.  He said they checked his blood pressure during the pain episode that was about 220 systolic on the EMTs, there was 190 systolic.  He has not had any problems with significantly elevated blood pressures.  Prior to this episode, he had not had any recent anginal episodes either, but doing very well.  He is fairly active guy and denies any dyspnea on exertion, PND, orthopnea, or other episodes of angina.  He denies any palpitations, lightheadedness, or dizziness.  Currently, he is chest pain free.  Otherwise, review of systems, he denies any bleeding.  He says his urine output has dropped significantly, otherwise no GU complaints.  The rest of review of systems was otherwise normal.  PAST MEDICAL HISTORY: 1. Ischemic heart disease.  He had three-vessel coronary artery bypass     graft in 1987, with a vein graft to the diagonal marginal and a     LIMA to LAD, then in 2009, he had a redo bypass with a left radial     graft to the LAD sequential saphenous vein grafts to the OM1 and     OM2. 2. He also has history of hypertension. 3. Dyslipidemia. 4. Peripheral vascular  disease.  He had a carotid endarterectomy on     left. 5. He has Crohn disease and had a colon resection. 6. History of anemia. 7. He has had history of bradycardia secondary to beta-blocker.  PAST SURGICAL HISTORY: 1. He had colon resection. 2. CABG x2. 3. Left carotid endarterectomy.  FAMILY HISTORY:  Dad had coronary artery disease.  SOCIAL HISTORY:  He is married.  He denies any alcohol or illicit drug use.  No tobacco use.  ALLERGIES:  He is allergic to NOVOCAIN.  CURRENT MEDICATIONS: 1. He is on an unknown dose of Lipitor. 2. He is on aspirin daily. 3. He is on Diovan 160 mg p.o. daily. 4. He is on isosorbide mononitrate 60 mg daily. 5. He is on Niaspan unknown dose daily. 6. He is on Protonix 40 mg daily. 7. He is on Plavix 75 mg daily. 8. Ranexa 500 mg p.o. b.i.d.  PHYSICAL EXAMINATION:  VITAL SIGNS:  He is afebrile, temperature 98, pulse 77 and regular, blood pressure 160/94, O2 sat 100% on room air, respiratory rate 16.  He is well-developed white male in  no apparent distress. HEENT:  Eyes are anicteric sclerae.  Pupils equal, round, and reactive to light. NECK:  He has normal jugular pressure in the right carotid bruit. LUNGS:  Clear to auscultation bilaterally. CARDIOVASCULAR:  Regular rate and rhythm.  No murmurs, rubs, or gallops. ABDOMEN:  Soft, nontender, no bruits. EXTREMITIES:  Warm without edema.  LABORATORY DATA:  White blood cell count 6.4, hematocrit 35, platelet count 110.  Sodium 141, potassium 4, chloride of 107, BUN 27, creatinine 1.6, glucose 106, CK-MB was 5.7.  Troponin was less than 0.3.  EKG shows sinus mechanism with first-degree AV block and right bundle-branch block and left posterior fascicular block.  No obvious nonspecific T-wave changes.  Chest x-ray shows no acute abnormality.  IMPRESSION AND PLAN:  This is an 77 year old white male with known coronary artery disease.  He has had angina likely in the setting of severe malignant  hypertension.  Currently, his blood pressure is improved and he is chest pain free.  We will admit him and monitor him on telemetry and check serial cardiac enzymes.  I suspect the angina was related to his significant blood pressure elevations as he has been doing well recently.  However, he does have significant coronary artery disease.  For now, we will continue him on aspirin, Plavix, and a statin in addition to his other home medications, we will need to up titrate his antihypertensive medications.  He reports his blood pressure has been running in the 140s to 160s consistently.  We will add hydrochlorothiazide to his Diovan, or consider adding Norvasc, if this does not control things well.  Apparently he is unable to tolerate beta- blocker due to bradycardia.  Otherwise he has a right carotid bruit- I cannot find if he has had recent ultrasound of his neck, we will consider doing this as an inpatient.          ______________________________ Natasha Bence, MD     MH/MEDQ  D:  11/09/2010  T:  11/09/2010  Job:  [id number]  Electronically Signed by Natasha Bence MD on 11/15/2010 07:32:58 PM

## 2010-11-21 ENCOUNTER — Encounter: Payer: Self-pay | Admitting: Nurse Practitioner

## 2010-11-21 ENCOUNTER — Ambulatory Visit (INDEPENDENT_AMBULATORY_CARE_PROVIDER_SITE_OTHER): Payer: Medicare FFS | Admitting: Nurse Practitioner

## 2010-11-21 ENCOUNTER — Telehealth: Payer: Self-pay | Admitting: *Deleted

## 2010-11-21 VITALS — BP 138/82 | HR 64 | Ht 67.0 in | Wt 138.0 lb

## 2010-11-21 DIAGNOSIS — I10 Essential (primary) hypertension: Secondary | ICD-10-CM

## 2010-11-21 DIAGNOSIS — R0989 Other specified symptoms and signs involving the circulatory and respiratory systems: Secondary | ICD-10-CM

## 2010-11-21 DIAGNOSIS — I739 Peripheral vascular disease, unspecified: Secondary | ICD-10-CM

## 2010-11-21 DIAGNOSIS — I2510 Atherosclerotic heart disease of native coronary artery without angina pectoris: Secondary | ICD-10-CM

## 2010-11-21 DIAGNOSIS — Z951 Presence of aortocoronary bypass graft: Secondary | ICD-10-CM | POA: Insufficient documentation

## 2010-11-21 NOTE — Assessment & Plan Note (Signed)
Has had prior L CEA with Dr. Madilyn Fireman. We will update his carotid duplex.

## 2010-11-21 NOTE — Patient Instructions (Signed)
We are going to check a ultrasound of your carotid arteries  Stay on your same medicines. Continue to monitor your blood pressure at home. You may take an extra Diovan for blood pressure staying above 160 You will see Dr. [name] for your cardiology care in 6 months Call for any problems.

## 2010-11-21 NOTE — Assessment & Plan Note (Signed)
Has history of remote MI with CABG in 2987 and redo CABG in 2008. Last cath was in 2010 and he is managed medically. At that time he had severe obstructive native disease, patent SVG to OM 1 & 2, patent SVG to LAD and patent SVG to 3rd OM. He is not having further chest pain and this last episode was felt to be more related to his high blood pressure.

## 2010-11-21 NOTE — Progress Notes (Signed)
Karl Stevenson Date of Birth: [birth year]   History of Present Illness: [name] is seen back today for a post hospital visit. He is seen for Dr. Excell Seltzer. He is a former patient of Dr. Ronnald Nian. He has an extensive history of CAD dating back to when he was 46. He has had redo CABG. Last cath in 2010. He is now managed medically. He presented to the ER on June 30th with chest discomfort. Blood pressure was grossly elevated at that time.No trigger was identified. He watches his blood pressure very closely at home and cannot explain this spike.  He was watched over night and sent home the following morning. No change in his medicines. He remains on his current regimen.  He was noted to have carotid bruits. He has known PVD with prior L CEA by Dr. Madilyn Fireman in 2010. He says he does not have regular follow up set up with [facility].   Since he has been home, he continues to monitor his blood pressure. His readings are very satisfactory. He will have a rare reading in the 160 systolic range but for the most part, his systolic readings are in the 120 -140 range. No chest pain. He remains active.   Current Outpatient Prescriptions on File Prior to Visit  Medication Sig Dispense Refill  . aspirin 81 MG tablet Take 81 mg by mouth 2 (two) times daily.       [name] atorvastatin (LIPITOR) 10 MG tablet Take 10 mg by mouth daily.        . clopidogrel (PLAVIX) 75 MG tablet Take 75 mg by mouth daily.        . fexofenadine (ALLEGRA) 180 MG tablet Take 180 mg by mouth daily.        [name] ipratropium (ATROVENT) 0.03 % nasal spray Place 2 sprays into the nose as directed. One or two sprays each nostril bid       . isosorbide mononitrate (IMDUR) 60 MG 24 hr tablet TAKE ONE TABLET BY MOUTH EVERY DAY.  90 tablet  0  . Multiple Vitamins-Minerals (CENTRUM PO) Take by mouth.        [name] NIASPAN 500 MG CR tablet TAKE ONE TABLET BY MOUTH EVERY DAY.  90 each  1  . nitroGLYCERIN (NITROSTAT) 0.4 MG SL tablet Take 0.4 mg by mouth  every 5 (five) minutes as needed.        . pantoprazole (PROTONIX) 40 MG tablet TAKE ONE TABLET BY MOUTH EVERY DAY.  90 tablet  0  . ranolazine (RANEXA) 500 MG 12 hr tablet Take 500 mg by mouth 2 (two) times daily.        . valsartan (DIOVAN) 160 MG tablet Take 160 mg by mouth daily.          Allergies  Allergen Reactions  . Novocain     Past Medical History  Diagnosis Date  . Coronary artery disease   . Hyperlipidemia   . Hypertension   . Carotid artery occlusion     s/p L CEA in 2010  . Crohn's disease     colon resection  . Anemia   . MI, old     age 77  . S/P CABG (coronary artery bypass graft) 1987  . S/P CABG (coronary artery bypass graft) April 2008  . S/P cardiac cath 2010    Manage medically  . Bradycardia     Intolerant to beta blockers  . Gouty arthritis   . Renal artery stenosis  noted in 2010 per cath. 70-80% Left RAS  . Systolic HF (heart failure)     EF 35 to 40% PER ECHO IN 2008; 40% PER CATH IN 2010    Past Surgical History  Procedure Date  . Coronary artery bypass graft 1987 and 2009  . Carotid endarterectomy 2010    left  . Colon resection   . Cardiac catheterization 2010    Manage medically     History  Smoking status  . Never Smoker   Smokeless tobacco  . Not on file    History  Alcohol Use No    Family History  Problem Relation Age of Onset  . Heart disease Father     Review of Systems: The review of systems is positive for chronic diarrhea due to his Crohns. No further chest pain.  Blood pressure diary is reviewed. No CHF symptoms. He does not take beta blockers due to bradycardia. All other systems were reviewed and are negative.  Physical Exam: BP 138/82  Pulse 64  Ht 5\' 7"  (1.702 m)  Wt 138 lb (62.596 kg)  BMI 21.61 kg/m2 Patient is very pleasant and in no acute distress. He looks younger than his stated age. Skin is warm and dry. Color is normal.  HEENT is unremarkable. Normocephalic/atraumatic. PERRL. Sclera are  nonicteric. Neck is supple. No masses. No JVD. Has bilateral carotid bruits. Op scar on the left noted.  Lungs are clear. Cardiac exam shows a regular rate and rhythm. He has a soft outflow murmur. Abdomen is soft. Extremities are without edema. Gait and ROM are intact. No gross neurologic deficits noted.  LABORATORY DATA:   Assessment / Plan:

## 2010-11-21 NOTE — Telephone Encounter (Signed)
lori added on renal study pt called and msg left to call office and ask for jodette, app date changed for carotid and new real duplex.Alfonso Ramus RN

## 2010-11-21 NOTE — Assessment & Plan Note (Addendum)
Blood pressure is satisfactory at goal. It was only while I was going thru his paper chart after the visit and adding information into the electronic medical record that I noted the RAS on the left. I do not believe that this has had any follow up. Nevertheless, blood pressure is ok. I had told him at his visit to use extra Diovan for consistent readings above 160. After his visit, I spoke with Dr. [place]. We will go ahead and check a renal duplex to follow up this. He is already scheduled for carotid duplex. Tentatively, we will see him back in about 6 months. He will see Dr. Excell Seltzer on return. Patient is agreeable to this plan and will call if any problems develop in the interim.

## 2010-11-23 ENCOUNTER — Inpatient Hospital Stay (HOSPITAL_COMMUNITY)
Admission: EM | Admit: 2010-11-23 | Discharge: 2010-11-25 | DRG: 309 | Disposition: A | Discharge status: Home / Self Care | Payer: Medicare FFS | Attending: Cardiovascular Disease | Admitting: Cardiovascular Disease

## 2010-11-23 DIAGNOSIS — E785 Hyperlipidemia, unspecified: Secondary | ICD-10-CM | POA: Diagnosis present

## 2010-11-23 DIAGNOSIS — Z7902 Long term (current) use of antithrombotics/antiplatelets: Secondary | ICD-10-CM

## 2010-11-23 DIAGNOSIS — I701 Atherosclerosis of renal artery: Secondary | ICD-10-CM | POA: Diagnosis present

## 2010-11-23 DIAGNOSIS — Z7982 Long term (current) use of aspirin: Secondary | ICD-10-CM

## 2010-11-23 DIAGNOSIS — Z8249 Family history of ischemic heart disease and other diseases of the circulatory system: Secondary | ICD-10-CM

## 2010-11-23 DIAGNOSIS — I452 Bifascicular block: Secondary | ICD-10-CM | POA: Diagnosis present

## 2010-11-23 DIAGNOSIS — I498 Other specified cardiac arrhythmias: Principal | ICD-10-CM | POA: Diagnosis present

## 2010-11-23 DIAGNOSIS — D649 Anemia, unspecified: Secondary | ICD-10-CM | POA: Diagnosis present

## 2010-11-23 DIAGNOSIS — I129 Hypertensive chronic kidney disease with stage 1 through stage 4 chronic kidney disease, or unspecified chronic kidney disease: Secondary | ICD-10-CM | POA: Diagnosis present

## 2010-11-23 DIAGNOSIS — I2510 Atherosclerotic heart disease of native coronary artery without angina pectoris: Secondary | ICD-10-CM | POA: Diagnosis present

## 2010-11-23 DIAGNOSIS — R55 Syncope and collapse: Secondary | ICD-10-CM | POA: Diagnosis present

## 2010-11-23 DIAGNOSIS — N183 Chronic kidney disease, stage 3 unspecified: Secondary | ICD-10-CM | POA: Diagnosis present

## 2010-11-23 DIAGNOSIS — K5090 Crohn's disease, unspecified, without complications: Secondary | ICD-10-CM | POA: Diagnosis present

## 2010-11-23 DIAGNOSIS — D696 Thrombocytopenia, unspecified: Secondary | ICD-10-CM | POA: Diagnosis present

## 2010-11-23 DIAGNOSIS — Z951 Presence of aortocoronary bypass graft: Secondary | ICD-10-CM

## 2010-11-23 DIAGNOSIS — I2589 Other forms of chronic ischemic heart disease: Secondary | ICD-10-CM | POA: Diagnosis present

## 2010-11-23 DIAGNOSIS — Z79899 Other long term (current) drug therapy: Secondary | ICD-10-CM

## 2010-11-23 LAB — COMPREHENSIVE METABOLIC PANEL
Alkaline Phosphatase: 67 U/L (ref 39–117)
BUN: 23 mg/dL (ref 6–23)
Chloride: 104 mEq/L (ref 96–112)
Creatinine, Ser: 1.4 mg/dL — ABNORMAL HIGH (ref 0.50–1.35)
GFR calc Af Amer: 59 mL/min — ABNORMAL LOW (ref >60)
Glucose, Bld: 105 mg/dL — ABNORMAL HIGH (ref 70–99)
Potassium: 4.5 mEq/L (ref 3.5–5.1)
Total Bilirubin: 0.4 mg/dL (ref 0.3–1.2)

## 2010-11-23 LAB — URINALYSIS, ROUTINE W REFLEX MICROSCOPIC
Bilirubin Urine: NEGATIVE
Glucose, UA: NEGATIVE mg/dL
Hgb urine dipstick: NEGATIVE
Ketones, ur: NEGATIVE mg/dL
Protein, ur: NEGATIVE mg/dL
pH: 5 (ref 5.0–8.0)

## 2010-11-23 LAB — CBC
HCT: 33.5 % — ABNORMAL LOW (ref 39.0–52.0)
Hemoglobin: 11.6 g/dL — ABNORMAL LOW (ref 13.0–17.0)
MCHC: 34.6 g/dL (ref 30.0–36.0)
MCV: 96.8 fL (ref 78.0–100.0)
RDW: 12.6 % (ref 11.5–15.5)

## 2010-11-23 LAB — DIFFERENTIAL
Basophils Absolute: 0 10*3/uL (ref 0.0–0.1)
Eosinophils Relative: 1 % (ref 0–5)
Lymphocytes Relative: 18 % (ref 12–46)
Lymphs Abs: 1.1 10*3/uL (ref 0.7–4.0)
Monocytes Absolute: 0.5 10*3/uL (ref 0.1–1.0)
Neutro Abs: 4.3 10*3/uL (ref 1.7–7.7)

## 2010-11-23 LAB — CK TOTAL AND CKMB (NOT AT ARMC)
CK, MB: 4.3 ng/mL — ABNORMAL HIGH (ref 0.3–4.0)
Total CK: 127 U/L (ref 7–232)

## 2010-11-24 ENCOUNTER — Inpatient Hospital Stay (HOSPITAL_COMMUNITY): Payer: Medicare FFS

## 2010-11-24 DIAGNOSIS — R42 Dizziness and giddiness: Secondary | ICD-10-CM

## 2010-11-24 DIAGNOSIS — I059 Rheumatic mitral valve disease, unspecified: Secondary | ICD-10-CM

## 2010-11-24 LAB — BASIC METABOLIC PANEL
BUN: 22 mg/dL (ref 6–23)
CO2: 26 mEq/L (ref 19–32)
Calcium: 8.9 mg/dL (ref 8.4–10.5)
Creatinine, Ser: 1.35 mg/dL (ref 0.50–1.35)
Glucose, Bld: 92 mg/dL (ref 70–99)
Sodium: 141 mEq/L (ref 135–145)

## 2010-11-24 LAB — LIPID PANEL
LDL Cholesterol: 49 mg/dL (ref 0–99)
Triglycerides: 70 mg/dL (ref <150)
VLDL: 14 mg/dL (ref 0–40)

## 2010-11-24 LAB — TSH: TSH: 2.713 u[IU]/mL (ref 0.350–4.500)

## 2010-11-24 LAB — CBC
HCT: 34.7 % — ABNORMAL LOW (ref 39.0–52.0)
Hemoglobin: 12 g/dL — ABNORMAL LOW (ref 13.0–17.0)
MCH: 33.6 pg (ref 26.0–34.0)
MCHC: 34.6 g/dL (ref 30.0–36.0)
MCV: 97.2 fL (ref 78.0–100.0)

## 2010-11-24 LAB — MAGNESIUM: Magnesium: 1.9 mg/dL (ref 1.5–2.5)

## 2010-11-24 MED ORDER — TECHNETIUM TO 99M ALBUMIN AGGREGATED
6.0000 | Freq: Once | INTRAVENOUS | Status: AC | PRN
  Administered 2010-11-24: 6 via INTRAVENOUS

## 2010-11-24 MED ORDER — XENON XE 133 GAS
10.0000 | GAS_FOR_INHALATION | Freq: Once | RESPIRATORY_TRACT | Status: AC | PRN
  Administered 2010-11-24: 10 via RESPIRATORY_TRACT

## 2010-11-25 LAB — BASIC METABOLIC PANEL
CO2: 29 mEq/L (ref 19–32)
Calcium: 8.5 mg/dL (ref 8.4–10.5)
Creatinine, Ser: 1.32 mg/dL (ref 0.50–1.35)

## 2010-11-30 NOTE — Discharge Summary (Signed)
Karl Stevenson, Karl Stevenson NO.:  [id number]  MEDICAL RECORD NO.:  [id number]  LOCATION:  2915                         FACILITY:  [facility]  PHYSICIAN:  Jesse Sans. Ruel Dimmick, MD, FACCDATE OF BIRTH:  11-13-1926  DATE OF ADMISSION:  11/08/2010 DATE OF DISCHARGE:  11/09/2010                              DISCHARGE SUMMARY   PRIMARY CARDIOLOGIST:  Colleen Can. Deborah Chalk, MD, unsure who he has to follow up with, because Dr. Deborah Chalk is retired.  DISCHARGE DIAGNOSIS:  Noncardiac chest pain, the patient ruled out for myocardial infarction.  Troponin is negative x3.  SECONDARY DIAGNOSES: 1. Coronary artery disease status post coronary artery bypass grafting     with a redo in 2008.     a.     Sequential vein graft to OM1 and OM2, SVG to OM3, SVG to      LAD.     b.     Patent vein grafts per cardiac catheterization in 2010. 2. Hypertension. 3. Hyperlipidemia. 4. Peripheral vascular disease status post left carotid endarterectomy     in 2010.  ALLERGIES:  PROCAINE causing headache and sweating.  PROCEDURE/DIAGNOSTICS:  Performed during hospitalization : Chest x-ray:  Borderline enlargement of cardiac silhouette post coronary artery bypass grafting.  Calcified tortuous aorta.  Diminish emphysematous changes in the left basilar atelectasis.  REASON FOR HOSPITALIZATION:  This is an 77 year old Caucasian gentleman with known coronary artery disease status post coronary artery bypass grafting as above who presented to [facility] Emergency Department with complaints of substernal chest pain.  The patient denied recent anginal symptoms but says that this pain was substernal and nonradiating.  He denied associated symptoms.  Also stated that this is different than his prior angina.  Initial blood pressure during the episode was a systolic of 220.  Upon evaluation in the emergency department, his blood pressure had improved and he was chest pain free.  EKG showed sinus rhythm without acute  changes but a right bundle-branch block and left anterior fascicular block.  He was admitted for further evaluation.  HOSPITAL COURSE:  The patient was admitted to the Step-Down Unit.  He was continued on Plavix, aspirin was well as his antihypertensives.  The patient had no further complaints of chest pain.  His troponins remained negative x3, therefore ruling out for myocardial infarction.  With his chest knee pain being unlike his typical angina as well as in the setting of severe hypertension, it was felt the patient could be discharged home with no further ischemic workup.  Dr. Daleen Squibb evaluated the patient did note him stable for home.  He knows that he will be continued on home medications at discharge.  He will have a close outpatient followup.  Again, the patient is noted to have bilateral carotid bruits and will follow up with Dr. Madilyn Fireman for further evaluation.  DISCHARGE LABS:  Troponin negative x3.  Cholesterol 107, triglycerides 67, HDL 47, LDL 47.  DISCHARGE MEDICATIONS: 1. Aspirin 81 mg over-the-counter 1 tablet twice daily. 2. Plavix 75 mg 1 tablet daily. 3. Diovan 160 mg 1 tablet daily. 4. Ipratropium 0.03% spray 1 or 2 sprays each nostril twice daily as     needed. 5. Isosorbide  mononitrate XR 60 mg 1 tablet daily. 6. Lipitor 10 mg 1 tablet daily. 7. Niacin 500 mg 1 tablet daily. 8. Protonix 40 mg 1 tablet daily. 9. Ranexa 500 mg 1 tablet twice daily.  FOLLOWUP PLANS AND INSTRUCTIONS: 1. The patient will follow up with Norma Fredrickson, nurse practitioner,     for Dr. Deborah Chalk, in 1-2 weeks, the office will call to schedule     this appointment. 2. The patient should increase activity as tolerated. 3. The patient should continue low-sodium healthy diet. 4. The patient should avoid strenuous activities  cause chest pain or     shortness of breath.  Duration of discharge greater than 30 minutes with physician, physician extended the time.     Leonette Monarch,  PA-C   ______________________________ Jesse Sans Daleen Squibb, MD, [facility]    NB/MEDQ  D:  11/09/2010  T:  11/10/2010  Job:  [id number]  cc:   Dante Gang, N.P.  Electronically Signed by Alen Blew P.A. on 11/13/2010 53:66:44 PM Electronically Signed by Valera Castle MD [facility] on 11/30/2010 04:31:41 PM

## 2010-12-01 ENCOUNTER — Encounter: Payer: Medicare FFS | Admitting: *Deleted

## 2010-12-01 NOTE — H&P (Signed)
NAMEMIQUEL, STACKS NO.:  [id number]  MEDICAL RECORD NO.:  [id number]  LOCATION:  3705                         FACILITY:  [facility]  PHYSICIAN:  Noralyn Pick. Eden Emms, MD, FACCDATE OF BIRTH:  27-Feb-1927  DATE OF ADMISSION:  11/23/2010 DATE OF DISCHARGE:                             HISTORY & PHYSICAL   PRIMARY CARDIOLOGIST:  Gwenyth Bouillon. Deborah Chalk, MD, will be Veverly Fells. Excell Seltzer, MD  PRIMARY CARE PHYSICIAN:  [name], MD  VASCULAR SURGEON:  Balinda Quails, MD  REASON FOR ADMISSION:  Syncope and bradycardia.  HISTORY OF PRESENT ILLNESS:  This is an 77 year old Caucasian male patient with extensive cardiac history, formally a patient of Dr. Deborah Chalk, most recently seen by Norma Fredrickson, nurse practitioner on November 21, 2010, to be established with Dr. Excell Seltzer, although he has not seen yet.  1. The patient has had a history of CAD with ischemic heart disease A:  Most recent LVEF of 35-40% in 2010 cardiac catheterization. B.  Status post coronary artery bypass grafting with LIMA to LAD and SVG to diagonal in 1987. C.  Status post redo coronary artery bypass grafting 2003, with left radial graft to LAD and sequential SVG to OM1 and OM2. 1. Hypertension. 2. Dyslipidemia. 3. Carotid artery disease.     a.     Status post left endarterectomy.     b.     Crohn disease status post colon resection. 4. Bradycardia intolerant to beta-blockers. 5. Renal artery stenosis with a 70-80% on the left. 6. History of anemia.  PAST SURGICAL HISTORY: 1. Coronary artery bypass grafting in 1987 and redo in 2003. 2. Left carotid endarterectomy in July of 2010. 3. Colon resection.  SOCIAL HISTORY:  He lives in [place] with his wife.  He is retired. He does not smoke, drink, or use drugs.  FAMILY HISTORY:  Father with CAD.  REVIEW OF SYSTEMS:  Positive for presyncope, weakness, and fatigue.  All other systems are reviewed and found to be negative unless listed  above.  CODE STATUS:  Full code.  Current medications prior to admission: 1. Aspirin 81 mg daily. 2. Plavix 75 mg daily. 3. Diovan 160 mg daily. 4. Ipratropium 0.03% 1-2 sprays to nostrils b.i.d. 5. Isosorbide mononitrate XR 60 mg daily. 6. Lipitor 10 mg daily. 7. Protonix 40 mg daily. 8. Ranexa 500 mg b.i.d.  ALLERGIES:  To PROCAINE causing severe diaphoresis.  He is also intolerant to BETA BLOCKER secondary to bradycardia.  CURRENT LABORATORY DATA:  Sodium 138, potassium 4.5, chloride 104, CO2 of 27, BUN 23, creatinine 1.4, glucose 105, hemoglobin 11.6, hematocrit 33.5, white blood cells 5.9, platelets 91 (on last admission in June 2012, platelets were 110).  EKG revealing right bundle-branch block with left anterior fascicular block rate of 54 beats per minute, bradycardia.  Radiology, pending.  PHYSICAL EXAMINATION:  VITAL SIGNS:  Blood pressure 154/96, pulse 79, respirations 20, O2 sat 100% on 2 liters.  He weighs 130 pounds. GENERAL:  He is awake, alert, and oriented, looking younger than stated age. HEENT:  Head is normocephalic and atraumatic.  Eyes PERRLA.  Neck is supple.  There is no thyromegaly.  There is a right  carotid bruit, none on the left.  There is no JVD seen. CARDIOVASCULAR:  Regular rate and rhythm, bradycardic 1/6 systolic murmur.  Pulses are 2+ and equal without bruits. LUNGS:  Clear to auscultation without wheezes, rales, or rhonchi. ABDOMEN:  Soft, nontender, 2+ bowel sounds.  No abdominal bruits are appreciated. EXTREMITIES:  Without clubbing, cyanosis, or edema. NEUROLOGIC:  Cranial nerves II-XII are grossly intact, a full neuro exam was completed. MUSCULOSKELETAL:  No joint deformity or effusions.  IMPRESSION: 1. Presyncope, episode while walking, no aura, seizure activity noted,     found to be mildly bradycardic on arrival with EMS with a heart     rate of 50 beats per minute.  He was normotensive, however.  We     will plan to place on  telemetry and evaluate further.  Continue to     cycle cardiac enzymes.  Monitor electrolytes. 2. Carotid artery disease status post left carotid endarterectomy in     July of 2010, with right coronary artery in 2009, eval at 1-39% at     the bifurcation.  We will repeat carotid Dopplers to evaluate for     further extension of carotid artery disease. 3. Coronary artery disease.  Status post coronary artery bypass     grafting with a redo, one in 1998 and 2007, we will continue to     cycle cardiac enzymes.  The patient may need to have a repeat     cardiac catheterization to evaluate for evidence of bypass graft     stenosis or further progression of coronary artery disease.     Echocardiogram will be completed to evaluate for ejection fraction     as well.  The patient may need to be seen     by EP, but we will evaluate his ejection fraction and discuss the     need for catheterization before EP evaluation if necessary.  We     will make more recommendations throughout hospital course depending     upon patient's response to treatment.     Bettey Mare. Lyman Bishop, NP   ______________________________ Noralyn Pick Eden Emms, MD, [facility]    KML/MEDQ  D:  11/23/2010  T:  11/24/2010  Job:  [id number]  cc:   Balinda Quails, M.D. [name], M.D.  Electronically Signed by Joni Reining NP on 11/27/2010 04:30:16 PM Electronically Signed by Charlton Haws MD [facility] on 12/01/2010 10:17:03 PM

## 2010-12-17 ENCOUNTER — Encounter: Payer: Medicare FFS | Admitting: Cardiology

## 2010-12-22 ENCOUNTER — Ambulatory Visit: Payer: Medicare FFS | Admitting: Cardiovascular Disease

## 2010-12-23 NOTE — Discharge Summary (Signed)
Karl Stevenson, Karl Stevenson NO.:  [id number]  MEDICAL RECORD NO.:  [id number]  LOCATION:  3705                         FACILITY:  [facility]  PHYSICIAN:  Veverly Fells. Excell Seltzer, MD  DATE OF BIRTH:  1926/08/11  DATE OF ADMISSION:  11/23/2010 DATE OF DISCHARGE:  11/25/2010                              DISCHARGE SUMMARY   PRIMARY CARDIOLOGIST:  Veverly Fells. Excell Seltzer, MD  PRIMARY CARE PROVIDER:  [name], MD  DISCHARGE DIAGNOSIS:  Syncope.  SECONDARY DIAGNOSES: 1. Coronary artery disease, status post coronary artery bypass graft     in 1987 with redo coronary artery bypass graft in 2003. 2. Ischemic cardiomyopathy with an ejection fraction of 35-40% by     echocardiogram this admission. 3. Hypertension. 4. Hyperlipidemia. 5. Carotid arterial disease, status post left carotid endarterectomy. 6. Crohn disease, status post colon resection. 7. History of bradycardia with intolerance to beta-blockers. 8. Left renal artery stenosis of 70-80%. 9. Anemia. 10.Stage III chronic kidney disease. 11.Thrombocytopenia.  ALLERGIES:  BETA-BLOCKER cause bradycardia.  PROCAINE causes severe diaphoresis.  PROCEDURES: 1. A 2-D echocardiogram performed on November 24, 2010 showing an EF of 35-     40% with akinesis of the mid distal intraseptal myocardium.  Mild     mitral regurgitation. 2. V/Q scan showing low likelihood ratio for pulmonary embolism.  HISTORY OF PRESENT ILLNESS:  An 77 year old male with the above complex problem list who was in his usual state of health until November 23, 2010, while walking at church felt lightheaded.  Once in the main area of the church, the patient sat and felt warm and more lightheaded.  He then apparently had a syncopal episode.  His next memory is of lying on the floor while EMS was surrounding him, appears bradycardic with rates in the 50s, but his blood pressure was normal.  He was taken to [facility] for further evaluation.  In the ED, he is mildly  hypertensive.  The point-of- care markers were negative.  He was admitted for further evaluation.  HOSPITAL COURSE:  The patient ruled out for MI.  A 2-D echocardiogram was performed showing stable cardiomyopathy with an EF of 35-40%.  He had no arrhythmias on the monitor.  It was felt that his syncope was most likely neuro depressor in nature.  He was noted to have a markedly elevated D-dimer at 11.79 and a V/Q scan was performed and was low probability.  His sedimentation rate was normal at 5.  The patient has been encouraged to prompting hydrate and we plan to discharge him home today in good condition.  DISCHARGE LABS:  Hemoglobin 12.0, hematocrit 34.7, WBC is 5.6, platelets 97,000.  ESR 5, D-dimer of 11.79.  Sodium 140, potassium 4.2, chloride 105, CO2 29, BUN 22, creatinine 1.32, glucose 97.  Total bilirubin 0.4, alkaline phosphatase 67, AST 18, ALT 14, total protein 6.2, albumin 3.5, calcium 8.5, magnesium 1.9.  CK 127, MB 4.3, troponin-I less than 0.30. Total cholesterol 104, triglycerides 70, HDL 41, LDL 49.  TSH 2.713. Urinalysis negative.  DISPOSITION:  The patient will be discharged home today in good condition.  FOLLOWUP PLANS AND APPOINTMENTS: 1. The patient has followup scheduled with Dr.  [name] on     January 06, 2011 at 11 a.m.  Follow up with Dr. Leslee Home as     previous scheduled.  DISCHARGE MEDICATIONS: 1. Nitroglycerin 0.4 mg sublingual p.r.n. chest pain. 2. Aspirin 81 mg b.i.d. 3. Plavix 75 mg daily. 4. Diovan 160 mg daily. 5. Ipratropium 0.03% one to two sprays b.i.d. p.r.n. 6. Imdur 60 mg daily. 7. Lipitor 10 mg daily. 8. Niaspan 500 mg nightly. 9. Protonix 40 mg daily. 10.Ranexa 500 mg b.i.d.  OUTSTANDING LABORATORY STUDIES:  None.  DURATION OF DISCHARGE ENCOUNTER:  Forty minutes including physician time.     Nicolasa Ducking, ANP   ______________________________ Veverly Fells. Excell Seltzer, MD    CB/MEDQ  D:  11/25/2010  T:   11/25/2010  Job:  [id number]  Electronically Signed by Nicolasa Ducking ANP on 12/01/2010 10:36:31 AM Electronically Signed by [name] MD on 12/23/2010 05:20:34 AM

## 2010-12-29 ENCOUNTER — Encounter (INDEPENDENT_AMBULATORY_CARE_PROVIDER_SITE_OTHER): Payer: Medicare FFS | Admitting: Cardiology

## 2010-12-29 DIAGNOSIS — I701 Atherosclerosis of renal artery: Secondary | ICD-10-CM

## 2010-12-29 DIAGNOSIS — I700 Atherosclerosis of aorta: Secondary | ICD-10-CM

## 2010-12-29 DIAGNOSIS — I714 Abdominal aortic aneurysm, without rupture, unspecified: Secondary | ICD-10-CM

## 2010-12-29 DIAGNOSIS — I10 Essential (primary) hypertension: Secondary | ICD-10-CM

## 2011-01-01 ENCOUNTER — Telehealth: Payer: Self-pay | Admitting: *Deleted

## 2011-01-01 NOTE — Telephone Encounter (Signed)
Renal stenosis and small aaa reported and to repeat in one year.

## 2011-01-02 ENCOUNTER — Telehealth: Payer: Self-pay | Admitting: *Deleted

## 2011-01-02 NOTE — Telephone Encounter (Signed)
Patient called with renal results from dr cooper and will have repeat test in one year for small aneurysm and stenosis. Pt verbalized understanding. Karl Ramus RN

## 2011-01-06 ENCOUNTER — Ambulatory Visit (INDEPENDENT_AMBULATORY_CARE_PROVIDER_SITE_OTHER): Payer: Medicare FFS | Admitting: Cardiovascular Disease

## 2011-01-06 ENCOUNTER — Encounter: Payer: Self-pay | Admitting: Cardiovascular Disease

## 2011-01-06 VITALS — BP 146/78 | HR 70 | Ht 67.0 in | Wt 138.8 lb

## 2011-01-06 DIAGNOSIS — I2510 Atherosclerotic heart disease of native coronary artery without angina pectoris: Secondary | ICD-10-CM

## 2011-01-06 DIAGNOSIS — I701 Atherosclerosis of renal artery: Secondary | ICD-10-CM

## 2011-01-06 DIAGNOSIS — I10 Essential (primary) hypertension: Secondary | ICD-10-CM

## 2011-01-06 NOTE — Assessment & Plan Note (Signed)
The patient has a history of renal atherosclerosis seen at the time of cardiac catheterization. He had a followup renal duplex scan dated December 29, 2010. This was reviewed and demonstrates normal renal size bilaterally, with less than 60% bilateral renal artery stenosis. He also has a small abdominal aortic aneurysm with maximum diameter 3.4 cm and this will be followed again in one year. I doubt this will ever become clinically relevant at his age.

## 2011-01-06 NOTE — Assessment & Plan Note (Signed)
Home blood pressures were reviewed. He has good blood pressure control with systolics averaging in the 120s to 130s and diastolics in the 70s. He will continue his current medical program.

## 2011-01-06 NOTE — Assessment & Plan Note (Signed)
The patient is status post redo coronary bypass surgery. He has moderate LV dysfunction is noted. He is stable on a combination of isosorbide, Ranexa, aspirin, and Plavix. He is on appropriate lipid-lowering therapy with atorvastatin. He also takes valsartan in the setting of LV dysfunction and hypertension. I like to see him back in followup in 6 months.

## 2011-01-06 NOTE — Patient Instructions (Signed)
Your physician wants you to follow-up in: 6 months with Dr. Cooper.  You will receive a reminder letter in the mail two months in advance. If you don't receive a letter, please call our office to schedule the follow-up appointment.   

## 2011-01-06 NOTE — Progress Notes (Signed)
HPI:  This is an 77 year old gentleman presenting for followup evaluation. The patient has a history of coronary artery disease with prior bypass surgery.  His initial coronary bypass surgery was in 1987 and he had redo CABG in 2008.  The patient has been followed by Dr. Deborah Chalk for many years and I will be assuming his care from here forward. The patient was recently hospitalized after a syncopal episode. He fainted at church after a prodrome of lightheadedness. He underwent extensive evaluation in the hospital included an echocardiogram, carotid duplex scan, and VQ scan. All of this was unrevealing. He does have moderate LV dysfunction with an ejection fraction of 40% and this has been documented in the past. He noted diarrhea a few days leading up to this syncopal event and we suspected it was related to hypovolemia.  The patient has had no further problems since his hospital discharge July 17. He denies chest pain or pressure. He denies dyspnea, lightheadedness, palpitations, or syncope. I have reviewed his records extensively and is medical problems appear stable. He is not on a beta blocker in the setting of LV dysfunction and ischemic heart disease, but there is documentation that he has a history of marked bradycardia and has been unable to tolerate beta blockers in the past.  Outpatient Encounter Prescriptions as of 01/06/2011  Medication Sig Dispense Refill  . aspirin 81 MG tablet Take 81 mg by mouth 2 (two) times daily.       [name] atorvastatin (LIPITOR) 10 MG tablet Take 10 mg by mouth daily.        [name] bismuth subsalicylate (KAOPECTATE) 262 MG/15ML suspension Take 15 mLs by mouth as needed.        . clopidogrel (PLAVIX) 75 MG tablet Take 75 mg by mouth daily.        . fexofenadine (ALLEGRA) 180 MG tablet Take 180 mg by mouth daily.        [name] ipratropium (ATROVENT) 0.03 % nasal spray Place 2 sprays into the nose as directed. One or two sprays each nostril bid       . isosorbide mononitrate (IMDUR) 60  MG 24 hr tablet TAKE ONE TABLET BY MOUTH EVERY DAY.  90 tablet  0  . Multiple Vitamins-Minerals (CENTRUM PO) Take by mouth.        [name] NIASPAN 500 MG CR tablet TAKE ONE TABLET BY MOUTH EVERY DAY.  90 each  1  . nitroGLYCERIN (NITROSTAT) 0.4 MG SL tablet Take 0.4 mg by mouth every 5 (five) minutes as needed.        . NON FORMULARY Take 5 g by mouth daily.        . pantoprazole (PROTONIX) 40 MG tablet TAKE ONE TABLET BY MOUTH EVERY DAY.  90 tablet  0  . ranolazine (RANEXA) 500 MG 12 hr tablet Take 500 mg by mouth 2 (two) times daily.        . valsartan (DIOVAN) 160 MG tablet Take 160 mg by mouth daily.          Allergies  Allergen Reactions  . Novocain     Past Medical History  Diagnosis Date  . Coronary artery disease   . Hyperlipidemia   . Hypertension   . Carotid artery occlusion     s/p L CEA in 2010  . Crohn's disease     colon resection  . Anemia   . MI, old     age 37  . S/P CABG (coronary artery bypass graft) 1987  .  S/P CABG (coronary artery bypass graft) April 2008  . S/P cardiac cath 2010    Manage medically  . Bradycardia     Intolerant to beta blockers  . Gouty arthritis   . Renal artery stenosis     noted in 2010 per cath. 70-80% Left RAS  . Systolic HF (heart failure)     EF 35 to 40% PER ECHO IN 2008; 40% PER CATH IN 2010    ROS: Negative except as per HPI  BP 146/78  Pulse 70  Ht 5\' 7"  (1.702 m)  Wt 138 lb 12.8 oz (62.959 kg)  BMI 21.74 kg/m2  PHYSICAL EXAM: Pt is alert and oriented, NAD HEENT: normal Neck: JVP - normal, carotids 2+= without bruits Lungs: CTA bilaterally CV: RRR without murmur or gallop Abd: soft, NT, Positive BS, no hepatomegaly Ext: no C/C/E, distal pulses intact and equal Skin: warm/dry no rash  EKG:  sinus rhythm 70 beats per minute, right bundle branch block, left anterior fascicular block, rare PVC.  ASSESSMENT AND PLAN:

## 2011-01-07 ENCOUNTER — Other Ambulatory Visit: Payer: Self-pay | Admitting: Cardiology

## 2011-01-07 NOTE — Telephone Encounter (Signed)
Med refill protonix

## 2011-01-28 ENCOUNTER — Other Ambulatory Visit: Payer: Self-pay | Admitting: Cardiology

## 2011-01-28 NOTE — Telephone Encounter (Signed)
Refilled Meds from fax  

## 2011-02-21 ENCOUNTER — Other Ambulatory Visit: Payer: Self-pay | Admitting: Cardiology

## 2011-03-16 ENCOUNTER — Other Ambulatory Visit: Payer: Self-pay | Admitting: Cardiology

## 2011-03-20 ENCOUNTER — Telehealth: Payer: Self-pay | Admitting: Cardiovascular Disease

## 2011-03-20 DIAGNOSIS — I10 Essential (primary) hypertension: Secondary | ICD-10-CM

## 2011-03-20 MED ORDER — AMLODIPINE BESYLATE 5 MG PO TABS: 5.0000 mg | ORAL_TABLET | Freq: Every day | ORAL | Status: DC

## 2011-03-20 MED ORDER — HYDROCHLOROTHIAZIDE 12.5 MG PO CAPS: 12.5000 mg | ORAL_CAPSULE | Freq: Every day | ORAL | Status: DC

## 2011-03-20 NOTE — Telephone Encounter (Signed)
Karl Stevenson is calling because of elevated BP.  It was 217/111 last night when he checked it around 9pm.  He took an extra dose of Diovan and it went down to 185/108.  This morning it was 200/111.  He denies chest pain, dizziness, headache, etc. but states his hip pain is worse than normal.   He states he normally takes his diovan with breakfast but wanted to call for instructions before taking it this morning.

## 2011-03-20 NOTE — Telephone Encounter (Signed)
Pt b/p is 200/111 and has been elevated since last night. No other symptoms.

## 2011-03-20 NOTE — Telephone Encounter (Signed)
Spoke with pt and gave him instructions from Dr. Excell Seltzer. Will send prescriptions to [facility] on [address] per his request. He will come in for a BMP on April 03, 2011 and office visit with Dr. Excell Seltzer at 11:45 on April 06, 2011

## 2011-03-20 NOTE — Telephone Encounter (Signed)
Reviewed with Dr. Excell Seltzer and he would like pt to start Norvasc 5 mg daily and HCTZ 12.5 mg daily with BMP in 2 weeks and appt with Dr. Excell Seltzer in 2-3 weeks.

## 2011-04-03 ENCOUNTER — Other Ambulatory Visit (INDEPENDENT_AMBULATORY_CARE_PROVIDER_SITE_OTHER): Payer: Medicare FFS | Admitting: *Deleted

## 2011-04-03 DIAGNOSIS — I10 Essential (primary) hypertension: Secondary | ICD-10-CM

## 2011-04-03 LAB — BASIC METABOLIC PANEL
CO2: 24 mEq/L (ref 19–32)
Chloride: 108 mEq/L (ref 96–112)
Creatinine, Ser: 1.6 mg/dL — ABNORMAL HIGH (ref 0.4–1.5)
Potassium: 4.4 mEq/L (ref 3.5–5.1)

## 2011-04-06 ENCOUNTER — Ambulatory Visit (INDEPENDENT_AMBULATORY_CARE_PROVIDER_SITE_OTHER): Payer: Medicare FFS | Admitting: Nurse Practitioner

## 2011-04-06 ENCOUNTER — Encounter: Payer: Self-pay | Admitting: Nurse Practitioner

## 2011-04-06 ENCOUNTER — Ambulatory Visit: Payer: Medicare FFS | Admitting: Cardiovascular Disease

## 2011-04-06 VITALS — BP 138/78 | HR 64 | Ht 68.0 in | Wt 140.4 lb

## 2011-04-06 DIAGNOSIS — I739 Peripheral vascular disease, unspecified: Secondary | ICD-10-CM

## 2011-04-06 DIAGNOSIS — I2510 Atherosclerotic heart disease of native coronary artery without angina pectoris: Secondary | ICD-10-CM

## 2011-04-06 DIAGNOSIS — I10 Essential (primary) hypertension: Secondary | ICD-10-CM

## 2011-04-06 NOTE — Assessment & Plan Note (Signed)
Does have a history of PVD. I am concerned that this left thigh pain is claudication. He has diminished pulses on the left. We will continue to follow. He has a follow up with Dr. Eloise Harman for his leg in early December.

## 2011-04-06 NOTE — Patient Instructions (Signed)
Stay off the HCTZ  Stay on your other medicines including the Norvasc (Amlodipine)  Continue to monitor your blood pressure at home.  Try to limit your use of Ibuprofen and use Tylenol Arthritis   We will see you back in about 3 months.

## 2011-04-06 NOTE — Progress Notes (Signed)
Karl Stevenson Date of Birth: 1927/04/04 Medical Record #[id number]  History of Present Illness: [name] is seen today in Dr. Earmon Phoenix absence. He has been doing fairly well from our standpoint. He has known ischemic heart disease and HTN. He monitors his blood pressure at home. He noted that it had trended up rather abruptly. Norvasc and HTCZ were started by Dr. Excell Seltzer. He had a follow up BMET this past Friday which showed some renal insufficiency. HCTZ was stopped. His blood pressure has come down nicely. He feels well. No chest pain. He has been limited by some pain in his left thigh. He saw Dr. Eloise Harman and had a hip Xray. He was told he had osteoarthritis. He was put on Ibuprofen and actually started taking it about the time his blood pressure went up. His thigh pain is only with walking. He describes it as "sore" and like a cramp. No pain at rest. He has no cardiac complaints.   Current Outpatient Prescriptions on File Prior to Visit  Medication Sig Dispense Refill  . amLODipine (NORVASC) 5 MG tablet Take 1 tablet (5 mg total) by mouth daily.  30 tablet  6  . aspirin 81 MG tablet Take 81 mg by mouth 2 (two) times daily.       [name] atorvastatin (LIPITOR) 10 MG tablet Take 10 mg by mouth daily.        [name] bismuth subsalicylate (KAOPECTATE) 262 MG/15ML suspension Take 15 mLs by mouth as needed.        [name] DIOVAN 160 MG tablet TAKE ONE TABLET BY MOUTH EVERY DAY  90 each  3  . fexofenadine (ALLEGRA) 180 MG tablet Take 180 mg by mouth daily.        [name] ipratropium (ATROVENT) 0.03 % nasal spray Place 2 sprays into the nose as directed. One or two sprays each nostril bid       . isosorbide mononitrate (IMDUR) 60 MG 24 hr tablet TAKE ONE TABLET BY MOUTH EVERY DAY  90 tablet  3  . Multiple Vitamins-Minerals (CENTRUM PO) Take by mouth.        [name] NIASPAN 500 MG CR tablet TAKE ONE TABLET BY MOUTH EVERY DAY.  90 each  1  . nitroGLYCERIN (NITROSTAT) 0.4 MG SL tablet Take 0.4 mg by mouth every 5 (five)  minutes as needed.        . pantoprazole (PROTONIX) 40 MG tablet TAKE ONE TABLET BY MOUTH EVERY DAY  90 tablet  0  . PLAVIX 75 MG tablet TAKE ONE TABLET BY MOUTH EVERY DAY  90 each  3  . RANEXA 500 MG 12 hr tablet TAKE ONE TABLET BY MOUTH TWICE DAILY  180 each  3    Allergies  Allergen Reactions  . Novocain     Past Medical History  Diagnosis Date  . Coronary artery disease   . Hyperlipidemia   . Hypertension   . Carotid artery occlusion     s/p L CEA in 2010  . Crohn's disease     colon resection  . Anemia   . MI, old     age 39  . S/P CABG (coronary artery bypass graft) 1987  . S/P CABG (coronary artery bypass graft) April 2008  . S/P cardiac cath 2010    Manage medically  . Bradycardia     Intolerant to beta blockers  . Gouty arthritis   . Renal artery stenosis     noted in 2010 per cath. 70-80% Left RAS;  last duplex in August 2012 with 1-59% on the right, and no blockage on the left  . Systolic HF (heart failure)     EF 35 to 40% PER ECHO IN 2008; 40% PER CATH IN 2010    Past Surgical History  Procedure Date  . Coronary artery bypass graft 1987 and 2009  . Carotid endarterectomy 2010    left  . Colon resection   . Cardiac catheterization 2010    Manage medically     History  Smoking status  . Never Smoker   Smokeless tobacco  . Not on file    History  Alcohol Use No    Family History  Problem Relation Age of Onset  . Heart disease Father     Review of Systems: The review of systems is per the HPI.  All other systems were reviewed and are negative.  Physical Exam: BP 138/78  Pulse 64  Ht 5\' 8"  (1.727 m)  Wt 140 lb 6.4 oz (63.685 kg)  BMI 21.35 kg/m2 Patient is very pleasant and in no acute distress. He looks younger than his stated age. Skin is warm and dry. Color is normal.  HEENT is unremarkable. Normocephalic/atraumatic. PERRL. Sclera are nonicteric. Neck is supple. No masses. No JVD. Lungs are clear. Cardiac exam shows a regular rate and  rhythm. Abdomen is soft. Extremities are without edema. He has 1+ pulse on the left and 2+ on the right. Gait and ROM are intact. No gross neurologic deficits noted.   LABORATORY DATA:   Assessment / Plan:

## 2011-04-06 NOTE — Assessment & Plan Note (Signed)
His blood pressure is fine at the present time. I have left him off the HCTZ. We will continue the Norvasc. Need to keep in mind that he has had renal artery stenosis documented although he has conflicting data. He will continue to monitor at home. I have asked him to use Tylenol arthritis instead for pain. We will see him back in about 3 months. Patient is agreeable to this plan and will call if any problems develop in the interim.

## 2011-04-06 NOTE — Assessment & Plan Note (Addendum)
No chest pain. He is doing well from this standpoint.

## 2011-04-20 ENCOUNTER — Other Ambulatory Visit: Payer: Self-pay | Admitting: Nurse Practitioner

## 2011-04-22 ENCOUNTER — Other Ambulatory Visit: Payer: Self-pay | Admitting: Internal Medicine

## 2011-04-22 DIAGNOSIS — I739 Peripheral vascular disease, unspecified: Secondary | ICD-10-CM

## 2011-04-24 ENCOUNTER — Other Ambulatory Visit: Payer: Medicare FFS

## 2011-04-27 ENCOUNTER — Ambulatory Visit
Admission: RE | Admit: 2011-04-27 | Discharge: 2011-04-27 | Disposition: A | Discharge status: Home / Self Care | Payer: Medicare FFS | Source: Ambulatory Visit | Attending: Internal Medicine | Admitting: Internal Medicine

## 2011-04-27 DIAGNOSIS — I739 Peripheral vascular disease, unspecified: Secondary | ICD-10-CM

## 2011-06-04 ENCOUNTER — Encounter: Payer: Self-pay | Admitting: Vascular Surgery

## 2011-06-05 ENCOUNTER — Encounter: Payer: Self-pay | Admitting: Vascular Surgery

## 2011-06-05 ENCOUNTER — Other Ambulatory Visit: Payer: Self-pay

## 2011-06-05 ENCOUNTER — Ambulatory Visit (INDEPENDENT_AMBULATORY_CARE_PROVIDER_SITE_OTHER): Payer: Medicare FFS | Admitting: Vascular Surgery

## 2011-06-05 ENCOUNTER — Encounter (HOSPITAL_COMMUNITY): Payer: Self-pay | Admitting: Pharmacy Technician

## 2011-06-05 VITALS — BP 140/80 | HR 69 | Resp 15 | Ht 67.0 in | Wt 139.0 lb

## 2011-06-05 DIAGNOSIS — I739 Peripheral vascular disease, unspecified: Secondary | ICD-10-CM

## 2011-06-05 DIAGNOSIS — I6529 Occlusion and stenosis of unspecified carotid artery: Secondary | ICD-10-CM | POA: Insufficient documentation

## 2011-06-05 DIAGNOSIS — IMO0001 Reserved for inherently not codable concepts without codable children: Secondary | ICD-10-CM | POA: Insufficient documentation

## 2011-06-05 NOTE — Progress Notes (Signed)
[facility] [place]  Referred by:  Garlan Fillers, MD [id number] [facility] [place], [place] [address]  Reason for referral: Left thigh pain  History of Present Illness  Karl Stevenson is a 78 y.o. male who presents with chief complaint: left thigh pain.  Onset of symptom occurred 1 month ago.  Pain is described as aching and occasionally sharp, severity 4-6/10, and associated with short distance ambulation.  Patient has attempted to treat this pain with rest.  The patient has never rest pain symptoms also and never had leg wounds/ulcers.  Atherosclerotic risk factors include: hyperlipidemia and HTN.  Past Medical History  Diagnosis Date  . Coronary artery disease   . Hyperlipidemia   . Hypertension   . Carotid artery occlusion     s/p L CEA in 2010  . Crohn's disease     colon resection  . Anemia   . MI, old     age 21  . S/P CABG (coronary artery bypass graft) 1987  . S/P CABG (coronary artery bypass graft) April 2008  . S/P cardiac cath 2010    Manage medically  . Bradycardia     Intolerant to beta blockers  . Gouty arthritis   . Renal artery stenosis     noted in 2010 per cath. 70-80% Left RAS; last duplex in August 2012 with 1-59% on the right, and no blockage on the left  . Systolic HF (heart failure)     EF 35 to 40% PER ECHO IN 2008; 40% PER CATH IN 2010  . Myocardial infarction 09/04/67    Past Surgical History  Procedure Date  . Carotid endarterectomy 2010    left  . Colon resection   . Cardiac catheterization 2010    Manage medically   . Coronary artery bypass graft 10/31/85 & 08/19/06    History   Social History  . Marital Status: Married    Spouse Name: N/A    Number of Children: N/A  . Years of Education: N/A   Occupational History  . Not on file.   Social History Main Topics  . Smoking status: Never Smoker   . Smokeless tobacco: Not on file  . Alcohol Use: No  . Drug Use: No  . Sexually  Active: Not on file   Other Topics Concern  . Not on file   Social History Narrative  . No narrative on file    Family History  Problem Relation Age of Onset  . Heart disease Father   . Cancer Mother     Current Outpatient Prescriptions on File Prior to Visit  Medication Sig Dispense Refill  . amLODipine (NORVASC) 5 MG tablet Take 1 tablet (5 mg total) by mouth daily.  30 tablet  6  . aspirin 81 MG tablet Take 81 mg by mouth 2 (two) times daily.       [name] atorvastatin (LIPITOR) 10 MG tablet Take 10 mg by mouth daily.        [name] bismuth subsalicylate (KAOPECTATE) 262 MG/15ML suspension Take 15 mLs by mouth as needed.        [name] DIOVAN 160 MG tablet TAKE ONE TABLET BY MOUTH EVERY DAY  90 each  3  . fexofenadine (ALLEGRA) 180 MG tablet Take 180 mg by mouth daily.        [name] ipratropium (ATROVENT) 0.03 % nasal spray Place 2 sprays into the nose as directed. One or two sprays each nostril bid       .  isosorbide mononitrate (IMDUR) 60 MG 24 hr tablet TAKE ONE TABLET BY MOUTH EVERY DAY  90 tablet  3  . Multiple Vitamins-Minerals (CENTRUM PO) Take by mouth.        [name] NIASPAN 500 MG CR tablet TAKE ONE TABLET BY MOUTH EVERY DAY.  90 each  1  . nitroGLYCERIN (NITROSTAT) 0.4 MG SL tablet Take 0.4 mg by mouth every 5 (five) minutes as needed.        . pantoprazole (PROTONIX) 40 MG tablet TAKE ONE TABLET BY MOUTH EVERY DAY  90 tablet  3  . PLAVIX 75 MG tablet TAKE ONE TABLET BY MOUTH EVERY DAY  90 each  3  . RANEXA 500 MG 12 hr tablet TAKE ONE TABLET BY MOUTH TWICE DAILY  180 each  3    Allergies  Allergen Reactions  . Novocain      Review of Systems (Positive items checked otherwise negative)  General: [ ]  Weight loss, [ ]  Weight gain, [ ]   Loss of appetite, [ ]  Fever  Neurologic: [ ]  Dizziness, [ ]  Blackouts, [ ]  Headaches, [ ]  Seizure  Ear/Nose/Throat: [ ]  Change in eyesight, [ ]  Change in hearing, [ ]  Nose bleeds, [ ]  Sore throat  Vascular: [ ]  Pain in legs with walking, [ ]  Pain in feet  while lying flat, [ ]  Non-healing ulcer, Stroke, [ ]  "Mini stroke", [ ]  Slurred speech, [ ]  Temporary blindness, [ ]  Blood clot in vein, [ ]  Phlebitis  Pulmonary: [ ]  Home oxygen, [ ]  Productive cough, [ ]  Bronchitis, [ ]  Coughing up blood,  [ ]  Asthma, [ ]  Wheezing  Musculoskeletal: [ ]  Arthritis, [ ]  Joint pain, [ ]  Muscle pain  Cardiac: [ ]  Chest pain, [ ]  Chest tightness/pressure, [ ]  Shortness of breath when lying flat, [ ]  Shortness of breath with exertion, [ ]  Palpitations, [ ]  Heart murmur, [ ]  Arrythmia,  [ ]  Atrial fibrillation  Hematologic: [ ]  Bleeding problems, [ ]  Clotting disorder, [ ]  Anemia  Psychiatric:  [ ]  Depression, [ ]  Anxiety, [ ]  Attention deficit disorder  Gastrointestinal:  [ ]  Black stool,[ ]   Blood in stool, [ ]  Peptic ulcer disease, [ ]  Reflux, [ ]  Hiatal hernia, [ ]  Trouble swallowing, [ ]  Diarrhea, [ ]  Constipation  Urinary:  [ ]  Kidney disease, [ ]  Burning with urination, [ ]  Frequent urination, [ ]  Difficulty urinating  Skin: [ ]  Ulcers, [ ]  Rashes   Physical Examination  Filed Vitals:   06/05/11 0918 06/05/11 0919  BP: 144/76 140/80  Pulse: 72 69  Resp: 15   Height: 5\' 7"  (1.702 m)   Weight: 139 lb (63.05 kg)   SpO2: 100% 100%   Body mass index is 21.77 kg/(m^2).   General: A&O x 3, WDWN, elderly and cachectic  Head: [place]/AT, mild temporalis wasting  Ear/Nose/Throat: Hearing grossly intact, nares w/o erythema or drainage, oropharynx w/o Erythema/Exudate  Eyes: PERRLA, EOMI  Neck: Supple, no nuchal rigidity, no palpable LAD, L CEA incision  Pulmonary: Sym exp, good air movt, CTAB, no rales, rhonchi, & wheezing  Cardiac: RRR, Nl S1, S2, no Murmurs, rubs or gallops  Vascular: Vessel Right Left  Radial Palpable Palpable  Brachial Palpable Palpable  Carotid Palpable, with bruit Palpable, without bruit  Aorta Non-palpable N/A  Femoral Palpable Palpable  Popliteal Non-palpable Non-palpable  PT Palpable Non-Palpable  DP Palpable  Non-Palpable   Gastrointestinal: soft, NTND, -G/R, - HSM, - masses, - CVAT B, lower midline  incision  Musculoskeletal: M/S 5/5 throughout , Extremities without ischemic changes, cyanotic feet, some CVI skin changes  Neurologic: CN 2-12 intact , Pain and light touch intact in extremities , Motor exam as listed above  Psychiatric: Judgment intact, Mood & affect appropriatefor pt's clinical situation  Dermatologic: See M/S exam for extremity exam, no rashes otherwise noted  Lymph : No Cervical, Axillary, or Inguinal lymphadenopathy   Outside Studies/Documentation 10 pages of outside documents were reviewed including: BLE PPG which suggest L leg inflow disease.  Medical Decision Making  Karl Stevenson is a 78 y.o. 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 Eventually 20% may require intervention in a year.  I discussed in depth with the patient the nature of atherosclerosis, and emphasized the importance of maximal medical management including strict control of blood pressure, blood glucose, and lipid levels, obtaining regular exercise, and cessation of smoking.  The patient is aware that without maximal medical management the underlying atherosclerotic disease process will progress, limiting the benefit of any interventions.  I discussed in depth with the patient a walking plan and how to execute such.  Based on his examination, I recommend Aortogram, Left leg runoff, possible iliac intervention which is schedule for the 24th of January  Additionally, follow B carotid duplex is scheduled with one month follow up given prior L CEA and R carotid bruit on exam  Thank you for allowing [place] to participate in this patient's care.  Leonides Sake, MD [facility] [place] Office: [phone] Pager: [phone]  06/05/2011, 9:50 AM

## 2011-06-10 MED ORDER — SODIUM CHLORIDE 0.9 % IV SOLN
INTRAVENOUS | Status: DC
  Administered 2011-06-11: 07:00:00 via INTRAVENOUS

## 2011-06-11 ENCOUNTER — Encounter (HOSPITAL_COMMUNITY)
Admission: RE | Disposition: A | Discharge status: Home / Self Care | Payer: Self-pay | Source: Ambulatory Visit | Attending: Vascular Surgery

## 2011-06-11 ENCOUNTER — Other Ambulatory Visit: Payer: Self-pay | Admitting: *Deleted

## 2011-06-11 ENCOUNTER — Ambulatory Visit (HOSPITAL_COMMUNITY)
Admission: RE | Admit: 2011-06-11 | Discharge: 2011-06-11 | Disposition: A | Discharge status: Home / Self Care | Payer: Medicare FFS | Source: Ambulatory Visit | Attending: Vascular Surgery | Admitting: Vascular Surgery

## 2011-06-11 DIAGNOSIS — Z7902 Long term (current) use of antithrombotics/antiplatelets: Secondary | ICD-10-CM | POA: Insufficient documentation

## 2011-06-11 DIAGNOSIS — K5090 Crohn's disease, unspecified, without complications: Secondary | ICD-10-CM | POA: Insufficient documentation

## 2011-06-11 DIAGNOSIS — I252 Old myocardial infarction: Secondary | ICD-10-CM | POA: Insufficient documentation

## 2011-06-11 DIAGNOSIS — Z79899 Other long term (current) drug therapy: Secondary | ICD-10-CM | POA: Insufficient documentation

## 2011-06-11 DIAGNOSIS — Z01818 Encounter for other preprocedural examination: Secondary | ICD-10-CM

## 2011-06-11 DIAGNOSIS — M109 Gout, unspecified: Secondary | ICD-10-CM | POA: Insufficient documentation

## 2011-06-11 DIAGNOSIS — Z951 Presence of aortocoronary bypass graft: Secondary | ICD-10-CM | POA: Insufficient documentation

## 2011-06-11 DIAGNOSIS — I5020 Unspecified systolic (congestive) heart failure: Secondary | ICD-10-CM | POA: Insufficient documentation

## 2011-06-11 DIAGNOSIS — I2510 Atherosclerotic heart disease of native coronary artery without angina pectoris: Secondary | ICD-10-CM | POA: Insufficient documentation

## 2011-06-11 DIAGNOSIS — I10 Essential (primary) hypertension: Secondary | ICD-10-CM | POA: Insufficient documentation

## 2011-06-11 DIAGNOSIS — I70219 Atherosclerosis of native arteries of extremities with intermittent claudication, unspecified extremity: Secondary | ICD-10-CM

## 2011-06-11 DIAGNOSIS — E785 Hyperlipidemia, unspecified: Secondary | ICD-10-CM | POA: Insufficient documentation

## 2011-06-11 DIAGNOSIS — M79609 Pain in unspecified limb: Secondary | ICD-10-CM | POA: Insufficient documentation

## 2011-06-11 DIAGNOSIS — Z7982 Long term (current) use of aspirin: Secondary | ICD-10-CM | POA: Insufficient documentation

## 2011-06-11 HISTORY — PX: ABDOMINAL AORTAGRAM: SHX5454

## 2011-06-11 SURGERY — ABDOMINAL AORTAGRAM
Anesthesia: LOCAL

## 2011-06-11 MED ORDER — LABETALOL HCL 5 MG/ML IV SOLN: 10.0000 mg | Freq: Four times a day (QID) | INTRAVENOUS | Status: DC | PRN

## 2011-06-11 MED ORDER — HEPARIN (PORCINE) IN NACL 2-0.9 UNIT/ML-% IJ SOLN
INTRAMUSCULAR | Status: AC
  Filled 2011-06-11: qty 1000

## 2011-06-11 MED ORDER — ACETAMINOPHEN 325 MG PO TABS: 650.0000 mg | ORAL_TABLET | ORAL | Status: DC | PRN

## 2011-06-11 MED ORDER — SODIUM CHLORIDE 0.9 % IV SOLN
1.0000 mL/kg/h | INTRAVENOUS | Status: DC
  Administered 2011-06-11: 1.008 mL/kg/h via INTRAVENOUS

## 2011-06-11 MED ORDER — ONDANSETRON HCL 4 MG/2ML IJ SOLN: 4.0000 mg | Freq: Four times a day (QID) | INTRAMUSCULAR | Status: DC | PRN

## 2011-06-11 MED ORDER — LIDOCAINE HCL (PF) 1 % IJ SOLN
INTRAMUSCULAR | Status: AC
  Filled 2011-06-11: qty 30

## 2011-06-11 MED ORDER — HYDRALAZINE HCL 20 MG/ML IJ SOLN: 10.0000 mg | INTRAMUSCULAR | Status: DC | PRN

## 2011-06-11 NOTE — Interval H&P Note (Signed)
--    [facility] of   History and Physical Update  The patient was interviewed and re-examined.  The patient's History and Physical has been reviewed and is unchanged.  There is no change in the plan of care.  Leonides Sake, MD [facility] [place] Office: [phone] Pager: [phone]  06/11/2011, 7:07 AM

## 2011-06-11 NOTE — Progress Notes (Signed)
Reviewed pt's D/C instructions with him and his family.  Both verbalized understanding.  Pt D/C with family via wheelchair.

## 2011-06-11 NOTE — Op Note (Signed)
OPERATIVE NOTE   PROCEDURE: 1.  Right common femoral artery cannulation under ultrasound guidance 2.  Aortogram 3.  Bilateral leg runoff  PRE-OPERATIVE DIAGNOSIS: Left thigh pain  POST-OPERATIVE DIAGNOSIS: same as above   SURGEON: Leonides Sake, MD  ANESTHESIA: conscious sedation  ESTIMATED BLOOD LOSS: 30 cc  CONTRAST: 95 cc  FINDING(S):  Aorta: diseased but patent  Superior mesenteric artery: patent but not well visualized with carbon dioxide  Celiac artery: not well visualized with carbon dioxide   Right Left  RA not well visualized with carbon dioxide not well visualized with carbon dioxide  CIA Diseased but patent Diseased but patent  EIA Diseased but patent Diseased but patent  IIA Diseased but patent >90% stenosis distal to takeoff  CFA 50-75% stenosis proximal to bifurcation >90% stenosis proximal to bifurcation  SFA Patent Patent  PFA Patent Patent  Pop Patent Patent  Trif Patent  Patent  AT Occludes shortly after take off Occludes shortly after take off  Pero Patent, contrast washes out distally Patent, contrast washes out distally  PT Patent, contrast washes out distally Patent, contrast washes out distally  Feet: runoff not visualized bilaterally due to washout but likely 2 vessel runoff bilaterally  SPECIMEN(S):  none  INDICATIONS:   Karl Stevenson is a 78 y.o. male who presents with left thigh pain.  Based on bilateral leg physiologic studies, an inflow lesion was suspected on the left side, subsequently, I recommended Aortogram, Bilateral Leg runoff, possible Left iliac intervention.  I discussed with the patient the nature of angiographic procedures, especially the limited patencies of any endovascular intervention.  The patient is aware of that the risks of an angiographic procedure include but are not limited to: bleeding, infection, access site complications, renal failure, embolization, rupture of vessel, dissection, possible need for emergent surgical  intervention, possible need for surgical procedures to treat the patient's pathology, and stroke and death.  The patient is aware of the risks and agrees to proceed.  DESCRIPTION: After full informed consent was obtained from the patient, the patient was brought back to the angiography suite.  The patient was placed supine upon the angiography table and connected to monitoring equipment.  The patient was then given conscious sedation, the amounts of which are documented in the patient's chart.  The patient was prepped and drape in the standard fashion for an angiographic procedure.  At this point, attention was turned to the right groin.  Under ultrasound guidance, the right common femoral artery will be cannulated with a 18 gauge needle.  The [facility] wire was passed up into the aorta.  The needle was exchanged for a 5-Fr sheath, which was advanced over the wire into the common femoral artery.  The dilator was then removed.  The Omniflush catheter was then loaded over the wire up to the level of L1.  The catheter was connected to the carbon dioxide circuit.  A hand injection of carbon dioxide was completed to complete the aortogram, the findings are listed above.  There was possible iliac disease imaged.  Subsequently, I felt contrast dye would be necessary.  I pulled the catheter down to proximal to the aortic bifurcation and connected the catheter to the power injector circuit.  After a declotting and deairring maneuver, a pelvic runoff was completed.  This imaged a possible left common iliac artery stenosis, so I felt further intervention with a pullback gradient was indicated.  The [facility] wire was replaced in the catheter, and using the Omniflush catheter, the  left common iliac artery was selected.  The catheter and wire were advanced into the external iliac artery.  I exchanged the catheter for a 5-Fr endhole catheter.  The catheter was connected to the pressure circuit and a pullback gradient was completed.   Only a 5 mm gradient was noted.  I then replaced the wire in the catheter and pulled the wire and catheter into the aorta.  The wire was placed more proximally into the aorta and the catheter exchanged for an Omniflush catheter.  The catheter was placed proximal to the aortic bifurcation and connected the catheter to the power injector circuit.  An automated bilateral leg runoff was completed.  The wire was replaced into the catheter and crook was straightened.  The wire and catheter were removed together.  The right femoral sheath was aspirated and no clot was noted.  The sheath was reloaded with heparinized saline.  The sheath will be removed in the holding area.  COMPLICATIONS: none  CONDITION: stable   Leonides Sake, MD [facility] [place] Office: [phone] Pager: [phone]  06/11/2011, 8:02 AM

## 2011-06-11 NOTE — H&P (View-Only) (Signed)
[facility] [place]  Referred by:  Daniel G Paterson, MD [id number] [facility] [place], [place] [address]  Reason for referral: Left thigh pain  History of Present Illness  Karl Stevenson is a 78 y.o. male who presents with chief complaint: left thigh pain.  Onset of symptom occurred 1 month ago.  Pain is described as aching and occasionally sharp, severity 4-6/10, and associated with short distance ambulation.  Patient has attempted to treat this pain with rest.  The patient has never rest pain symptoms also and never had leg wounds/ulcers.  Atherosclerotic risk factors include: hyperlipidemia and HTN.  Past Medical History  Diagnosis Date  . Coronary artery disease   . Hyperlipidemia   . Hypertension   . Carotid artery occlusion     s/p L CEA in 2010  . Crohn's disease     colon resection  . Anemia   . MI, old     age 41  . S/P CABG (coronary artery bypass graft) 1987  . S/P CABG (coronary artery bypass graft) April 2008  . S/P cardiac cath 2010    Manage medically  . Bradycardia     Intolerant to beta blockers  . Gouty arthritis   . Renal artery stenosis     noted in 2010 per cath. 70-80% Left RAS; last duplex in August 2012 with 1-59% on the right, and no blockage on the left  . Systolic HF (heart failure)     EF 35 to 40% PER ECHO IN 2008; 40% PER CATH IN 2010  . Myocardial infarction 09/04/67    Past Surgical History  Procedure Date  . Carotid endarterectomy 2010    left  . Colon resection   . Cardiac catheterization 2010    Manage medically   . Coronary artery bypass graft 10/31/85 & 08/19/06    History   Social History  . Marital Status: Married    Spouse Name: N/A    Number of Children: N/A  . Years of Education: N/A   Occupational History  . Not on file.   Social History Main Topics  . Smoking status: Never Smoker   . Smokeless tobacco: Not on file  . Alcohol Use: No  . Drug Use: No  . Sexually  Active: Not on file   Other Topics Concern  . Not on file   Social History Narrative  . No narrative on file    Family History  Problem Relation Age of Onset  . Heart disease Father   . Cancer Mother     Current Outpatient Prescriptions on File Prior to Visit  Medication Sig Dispense Refill  . amLODipine (NORVASC) 5 MG tablet Take 1 tablet (5 mg total) by mouth daily.  30 tablet  6  . aspirin 81 MG tablet Take 81 mg by mouth 2 (two) times daily.       . atorvastatin (LIPITOR) 10 MG tablet Take 10 mg by mouth daily.        . bismuth subsalicylate (KAOPECTATE) 262 MG/15ML suspension Take 15 mLs by mouth as needed.        . DIOVAN 160 MG tablet TAKE ONE TABLET BY MOUTH EVERY DAY  90 each  3  . fexofenadine (ALLEGRA) 180 MG tablet Take 180 mg by mouth daily.        . ipratropium (ATROVENT) 0.03 % nasal spray Place 2 sprays into the nose as directed. One or two sprays each nostril bid       .   isosorbide mononitrate (IMDUR) 60 MG 24 hr tablet TAKE ONE TABLET BY MOUTH EVERY DAY  90 tablet  3  . Multiple Vitamins-Minerals (CENTRUM PO) Take by mouth.        . NIASPAN 500 MG CR tablet TAKE ONE TABLET BY MOUTH EVERY DAY.  90 each  1  . nitroGLYCERIN (NITROSTAT) 0.4 MG SL tablet Take 0.4 mg by mouth every 5 (five) minutes as needed.        . pantoprazole (PROTONIX) 40 MG tablet TAKE ONE TABLET BY MOUTH EVERY DAY  90 tablet  3  . PLAVIX 75 MG tablet TAKE ONE TABLET BY MOUTH EVERY DAY  90 each  3  . RANEXA 500 MG 12 hr tablet TAKE ONE TABLET BY MOUTH TWICE DAILY  180 each  3    Allergies  Allergen Reactions  . Novocain      Review of Systems (Positive items checked otherwise negative)  General: [ ] Weight loss, [ ] Weight gain, [ ]  Loss of appetite, [ ] Fever  Neurologic: [ ] Dizziness, [ ] Blackouts, [ ] Headaches, [ ] Seizure  Ear/Nose/Throat: [ ] Change in eyesight, [ ] Change in hearing, [ ] Nose bleeds, [ ] Sore throat  Vascular: [ ] Pain in legs with walking, [ ] Pain in feet  while lying flat, [ ] Non-healing ulcer, Stroke, [ ] "Mini stroke", [ ] Slurred speech, [ ] Temporary blindness, [ ] Blood clot in vein, [ ] Phlebitis  Pulmonary: [ ] Home oxygen, [ ] Productive cough, [ ] Bronchitis, [ ] Coughing up blood,  [ ] Asthma, [ ] Wheezing  Musculoskeletal: [ ] Arthritis, [ ] Joint pain, [ ] Muscle pain  Cardiac: [ ] Chest pain, [ ] Chest tightness/pressure, [ ] Shortness of breath when lying flat, [ ] Shortness of breath with exertion, [ ] Palpitations, [ ] Heart murmur, [ ] Arrythmia,  [ ] Atrial fibrillation  Hematologic: [ ] Bleeding problems, [ ] Clotting disorder, [ ] Anemia  Psychiatric:  [ ] Depression, [ ] Anxiety, [ ] Attention deficit disorder  Gastrointestinal:  [ ] Black stool,[ ]  Blood in stool, [ ] Peptic ulcer disease, [ ] Reflux, [ ] Hiatal hernia, [ ] Trouble swallowing, [ ] Diarrhea, [ ] Constipation  Urinary:  [ ] Kidney disease, [ ] Burning with urination, [ ] Frequent urination, [ ] Difficulty urinating  Skin: [ ] Ulcers, [ ] Rashes   Physical Examination  Filed Vitals:   06/05/11 0918 06/05/11 0919  BP: 144/76 140/80  Pulse: 72 69  Resp: 15   Height: 5' 7" (1.702 m)   Weight: 139 lb (63.05 kg)   SpO2: 100% 100%   Body mass index is 21.77 kg/(m^2).   General: A&O x 3, WDWN, elderly and cachectic  Head: [place]/AT, mild temporalis wasting  Ear/Nose/Throat: Hearing grossly intact, nares w/o erythema or drainage, oropharynx w/o Erythema/Exudate  Eyes: PERRLA, EOMI  Neck: Supple, no nuchal rigidity, no palpable LAD, L CEA incision  Pulmonary: Sym exp, good air movt, CTAB, no rales, rhonchi, & wheezing  Cardiac: RRR, Nl S1, S2, no Murmurs, rubs or gallops  Vascular: Vessel Right Left  Radial Palpable Palpable  Brachial Palpable Palpable  Carotid Palpable, with bruit Palpable, without bruit  Aorta Non-palpable N/A  Femoral Palpable Palpable  Popliteal Non-palpable Non-palpable  PT Palpable Non-Palpable  DP Palpable  Non-Palpable   Gastrointestinal: soft, NTND, -G/R, - HSM, - masses, - CVAT B, lower midline   incision  Musculoskeletal: M/S 5/5 throughout , Extremities without ischemic changes, cyanotic feet, some CVI skin changes  Neurologic: CN 2-12 intact , Pain and light touch intact in extremities , Motor exam as listed above  Psychiatric: Judgment intact, Mood & affect appropriatefor pt's clinical situation  Dermatologic: See M/S exam for extremity exam, no rashes otherwise noted  Lymph : No Cervical, Axillary, or Inguinal lymphadenopathy   Outside Studies/Documentation 10 pages of outside documents were reviewed including: BLE PPG which suggest L leg inflow disease.  Medical Decision Making  Karl Stevenson is a 78 y.o. 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 Eventually 20% may require intervention in a year.  I discussed in depth with the patient the nature of atherosclerosis, and emphasized the importance of maximal medical management including strict control of blood pressure, blood glucose, and lipid levels, obtaining regular exercise, and cessation of smoking.  The patient is aware that without maximal medical management the underlying atherosclerotic disease process will progress, limiting the benefit of any interventions.  I discussed in depth with the patient a walking plan and how to execute such.  Based on his examination, I recommend Aortogram, Left leg runoff, possible iliac intervention which is schedule for the 24th of January  Additionally, follow B carotid duplex is scheduled with one month follow up given prior L CEA and R carotid bruit on exam  Thank you for allowing us to participate in this patient's care.  Etan Vasudevan, MD [facility] [place] Office: [phone] Pager: [phone]  06/05/2011, 9:50 AM    

## 2011-06-12 HISTORY — PX: FEMORAL ENDARTERECTOMY: SUR606

## 2011-06-12 LAB — POCT I-STAT, CHEM 8
BUN: 26 mg/dL — ABNORMAL HIGH (ref 6–23)
Chloride: 107 mEq/L (ref 96–112)
Creatinine, Ser: 1.6 mg/dL — ABNORMAL HIGH (ref 0.50–1.35)
Hemoglobin: 12.2 g/dL — ABNORMAL LOW (ref 13.0–17.0)
Potassium: 4.3 mEq/L (ref 3.5–5.1)
Sodium: 141 mEq/L (ref 135–145)

## 2011-06-16 ENCOUNTER — Ambulatory Visit (INDEPENDENT_AMBULATORY_CARE_PROVIDER_SITE_OTHER): Payer: Medicare FFS | Admitting: Physician Assistant

## 2011-06-16 ENCOUNTER — Encounter: Payer: Self-pay | Admitting: Physician Assistant

## 2011-06-16 VITALS — BP 136/70 | HR 70 | Ht 67.0 in | Wt 136.0 lb

## 2011-06-16 DIAGNOSIS — I255 Ischemic cardiomyopathy: Secondary | ICD-10-CM | POA: Insufficient documentation

## 2011-06-16 DIAGNOSIS — Z01810 Encounter for preprocedural cardiovascular examination: Secondary | ICD-10-CM

## 2011-06-16 DIAGNOSIS — I2510 Atherosclerotic heart disease of native coronary artery without angina pectoris: Secondary | ICD-10-CM

## 2011-06-16 DIAGNOSIS — I10 Essential (primary) hypertension: Secondary | ICD-10-CM

## 2011-06-16 DIAGNOSIS — I739 Peripheral vascular disease, unspecified: Secondary | ICD-10-CM

## 2011-06-16 DIAGNOSIS — I2589 Other forms of chronic ischemic heart disease: Secondary | ICD-10-CM

## 2011-06-16 DIAGNOSIS — I5022 Chronic systolic (congestive) heart failure: Secondary | ICD-10-CM | POA: Insufficient documentation

## 2011-06-16 NOTE — Assessment & Plan Note (Signed)
Revascularization surgery pending with Dr. Imogene Burn.

## 2011-06-16 NOTE — Assessment & Plan Note (Signed)
Controlled.  Continue current therapy.  

## 2011-06-16 NOTE — Progress Notes (Addendum)
[address] [place], [place]  [address] Phone: [phone] Fax:  [phone]  Date:  06/16/2011   Name:  Karl Stevenson       DOB:  1927/01/01 MRN:  [id number]  PCP:  Dr. Eloise Harman  Primary Cardiologist:  Dr. [name]  Primary Electrophysiologist:  None    History of Present Illness: Karl Stevenson is a 78 y.o. male who presents for surgical clearance.  He has a history of systolic CHF, ICM, CAD, status post prior bypass surgery in 1987 and redo bypass in 2008.  He was previously followed by Dr. Deborah Chalk.  He established with Dr. Excell Seltzer in 8/12.  Last LHC 1/10: SVG-OM 1/OM 2 patent, SVG-LAD patent, SVG-OM 3 Patent.  He had mild to moderate left subclavian stenosis with 40-50% proximal disease, EF 40%.  Ultrasound 8/12: 3.4 cm AAA.  Renal arterial Dopplers 8/12: Less than 60% bilateral stenosis.  He has a history of carotid stenosis, status post left CEA in 2010.  Last echo 7/12: EF 35-40%, anteroseptal hypokinesis, mild MR.  Other history includes hypertension, hyperlipidemia, Crohn's disease, anemia, bradycardia, gout.  He was recently seen by Dr. Imogene Burn for left leg claudication.  Peripheral angiogram performed 1/31 demonstrates significant left leg internal iliac and common femoral artery stenoses.  He apparently needs lower extremity bypass.  He denies chest pain, shortness breath, syncope, orthopnea, PND or edema.  He is somewhat limited in his activity by his left leg claudication.  He cannot clearly achieve 4 METs.    Past Medical History  Diagnosis Date  . Coronary artery disease     CABG 1987, redo 2008; Last LHC 1/10: SVG-OM 1/OM 2 patent, SVG-LAD patent, SVG-OM 3 Patent  . Hyperlipidemia   . Hypertension   . Carotid artery occlusion     s/p L CEA in 2010  . Crohn's disease     colon resection  . Anemia   . S/P CABG (coronary artery bypass graft)     1987 and 2008  . Bradycardia     Intolerant to beta blockers  . Gouty arthritis   . Renal  artery stenosis     noted in 2010 per cath. 70-80% Left RAS; last duplex in August 2012 with 1-59% on the right, and no blockage on the left  . Chronic systolic heart failure     echo 7/12: EF 35-40%, anteroseptal hypokinesis, mild MR.    Current Outpatient Prescriptions  Medication Sig Dispense Refill  . amLODipine (NORVASC) 5 MG tablet Take 1 tablet (5 mg total) by mouth daily.  30 tablet  6  . aspirin 81 MG tablet Take 81 mg by mouth daily.       [name] atorvastatin (LIPITOR) 10 MG tablet Take 10 mg by mouth daily.        . clopidogrel (PLAVIX) 75 MG tablet Take 75 mg by mouth daily.      . fexofenadine (ALLEGRA) 180 MG tablet Take 180 mg by mouth daily.        [name] ibuprofen (ADVIL,MOTRIN) 600 MG tablet Take 600 mg by mouth every 6 (six) hours as needed. For pain      . ipratropium (ATROVENT) 0.03 % nasal spray Place 1-2 sprays into the nose 2 (two) times daily.       . isosorbide mononitrate (IMDUR) 60 MG 24 hr tablet Take 60 mg by mouth daily.      . Multiple Vitamin (MULITIVITAMIN WITH MINERALS) TABS Take 1 tablet by mouth daily.      [name]  nitroGLYCERIN (NITROSTAT) 0.4 MG SL tablet Place 0.4 mg under the tongue every 5 (five) minutes as needed. For chest pain      . pantoprazole (PROTONIX) 40 MG tablet Take 40 mg by mouth daily.      . valsartan (DIOVAN) 160 MG tablet Take 160 mg by mouth daily.         Allergies: Allergies  Allergen Reactions  . Novocain     History  Substance Use Topics  . Smoking status: Never Smoker   . Smokeless tobacco: Not on file  . Alcohol Use: No     ROS:  Please see the history of present illness.   All other systems reviewed and negative.   PHYSICAL EXAM: VS:  BP 136/70  Pulse 70  Ht 5\' 7"  (1.702 m)  Wt 136 lb (61.689 kg)  BMI 21.30 kg/m2 Well nourished, well developed, in no acute distress HEENT: normal Neck: no JVD Cardiac:  normal S1, S2; RRR; no murmur Lungs:  clear to auscultation bilaterally, no wheezing, rhonchi or rales Abd: soft,  nontender, no hepatomegaly Ext: no edema Skin: warm and dry Neuro:  CNs 2-12 intact, no focal abnormalities noted Psych: Normal affect  EKG:  Sinus rhythm, heart rate 70, left anterior fascicular block, right bundle branch block, no change compared to prior tracing  ASSESSMENT AND PLAN:

## 2011-06-16 NOTE — Assessment & Plan Note (Signed)
Continue ARB.  As noted, he is currently not on a beta blocker due to history of bradycardia.

## 2011-06-16 NOTE — Assessment & Plan Note (Signed)
He has significant CAD with 2 revascularization surgeries.  His last heart catheterization was in 2010.  He cannot clearly achieve 4 METs.  I discussed his case with Dr. Excell Seltzer.  We will obtain a Lexiscan Myoview prior to giving clearance.  He has a history of bradycardia and therefore is not on a beta blocker.  If his Myoview is low risk, he may proceed with surgery.  However, in light of his significant CAD, ICM, systolic CHF and advanced age, he will be mod to high risk for cardiovascular complications.  I discussed this with the patient and his wife.

## 2011-06-16 NOTE — Patient Instructions (Signed)
Your physician recommends that you schedule a follow-up appointment in: 2 months with Dr Excell Seltzer Your physician has requested that you have a lexiscan myoview. For further information please visit [email or web address] Please follow instruction sheet, as given.

## 2011-06-16 NOTE — Assessment & Plan Note (Signed)
Volume appears stable.  He will need close attention to his volume status throughout the perioperative period to avoid volume overload.

## 2011-06-18 ENCOUNTER — Ambulatory Visit (HOSPITAL_COMMUNITY): Payer: Medicare FFS | Attending: Cardiology | Admitting: Radiology

## 2011-06-18 VITALS — BP 112/60 | Ht 67.0 in | Wt 137.0 lb

## 2011-06-18 DIAGNOSIS — I779 Disorder of arteries and arterioles, unspecified: Secondary | ICD-10-CM | POA: Insufficient documentation

## 2011-06-18 DIAGNOSIS — Z01810 Encounter for preprocedural cardiovascular examination: Secondary | ICD-10-CM | POA: Insufficient documentation

## 2011-06-18 DIAGNOSIS — I2510 Atherosclerotic heart disease of native coronary artery without angina pectoris: Secondary | ICD-10-CM

## 2011-06-18 DIAGNOSIS — I10 Essential (primary) hypertension: Secondary | ICD-10-CM

## 2011-06-18 DIAGNOSIS — Z951 Presence of aortocoronary bypass graft: Secondary | ICD-10-CM | POA: Insufficient documentation

## 2011-06-18 DIAGNOSIS — I739 Peripheral vascular disease, unspecified: Secondary | ICD-10-CM

## 2011-06-18 DIAGNOSIS — R002 Palpitations: Secondary | ICD-10-CM | POA: Insufficient documentation

## 2011-06-18 DIAGNOSIS — I4510 Unspecified right bundle-branch block: Secondary | ICD-10-CM | POA: Insufficient documentation

## 2011-06-18 DIAGNOSIS — E785 Hyperlipidemia, unspecified: Secondary | ICD-10-CM | POA: Insufficient documentation

## 2011-06-18 DIAGNOSIS — I255 Ischemic cardiomyopathy: Secondary | ICD-10-CM

## 2011-06-18 MED ORDER — TECHNETIUM TC 99M TETROFOSMIN IV KIT
10.4000 | PACK | Freq: Once | INTRAVENOUS | Status: AC | PRN
  Administered 2011-06-18: 10 via INTRAVENOUS

## 2011-06-18 MED ORDER — REGADENOSON 0.4 MG/5ML IV SOLN
0.4000 mg | Freq: Once | INTRAVENOUS | Status: AC
  Administered 2011-06-18: 0.4 mg via INTRAVENOUS

## 2011-06-18 MED ORDER — TECHNETIUM TC 99M TETROFOSMIN IV KIT
33.0000 | PACK | Freq: Once | INTRAVENOUS | Status: AC | PRN
  Administered 2011-06-18: 33 via INTRAVENOUS

## 2011-06-18 NOTE — Progress Notes (Signed)
[facility] SITE 3 NUCLEAR MED [address] [place] [address] [phone]  Cardiology Nuclear Med Study  BENJIMEN KELLEY is a 78 y.o. male [id number] 1927/03/22   Nuclear Med Background Indication for Stress Test:  Evaluation for Ischemia and Graft Patency, and Surgical Clearance for  Lower Extremity Bypass (sig. iliac and femoral) artery disease) by Nilda Simmer History:  Abnormal EKG, '87 2008 CABG, 05/2008 Heart Cath: patent grafts EF: 40%, 07/12 ECHO: EF: 35-40% Cardiac Risk Factors: Carotid Disease, Claudication, Hypertension, Lipids, PVD and RBBB  Symptoms:  Palpitations   Nuclear Pre-Procedure Caffeine/Decaff Intake:  None NPO After: 6:00pm yesterday   Lungs:  clear IV 0.9% NS with Angio Cath:  20g  IV Site: R Antecubital x 1, tolerated well IV Started by:  Irean Hong, RN  Chest Size (in):  44 Cup Size: n/a  Height: 5\' 7"  (1.702 m)  Weight:  137 lb (62.143 kg)  BMI:  Body mass index is 21.46 kg/(m^2). Tech Comments:  This patient had no symptoms with Lexiscan.    Nuclear Med Study 1 or 2 day study: 1 day  Stress Test Type:  Lexiscan  Reading MD: Charlton Haws, MD  Order Authorizing Provider:  [name], MD, and Tereso Newcomer, [facility]  Resting Radionuclide: Technetium 27m Tetrofosmin  Resting Radionuclide Dose: 10.4 mCi   Stress Radionuclide:  Technetium 62m Tetrofosmin  Stress Radionuclide Dose: 33.0 mCi           Stress Protocol Rest HR: 62 Stress HR: 102  Rest BP: 112/60 Stress BP: 114/58  Exercise Time (min): n/a METS: n/a   Predicted Max HR: 138 bpm % Max HR: 73.91 bpm Rate Pressure Product: 21308   Dose of Adenosine (mg):  n/a Dose of Lexiscan: 0.4 mg  Dose of Atropine (mg): n/a Dose of Dobutamine: n/a mcg/kg/min (at max HR)  Stress Test Technologist: Milana Na, EMT-P  Nuclear Technologist:  Doyne Keel, CNMT     Rest Procedure:  Myocardial perfusion imaging was performed at rest 45 minutes following the  intravenous administration of Technetium 27m Tetrofosmin. Rest ECG: NSR  Stress Procedure:  The patient received IV Lexiscan 0.4 mg over 15-seconds.  Technetium 40m Tetrofosmin injected at 30-seconds.  There were no significant changes and rare pvcs with Lexiscan.  Quantitative spect images were obtained after a 45 minute delay. Stress ECG: No significant change from baseline ECG  QPS Raw Data Images:  Normal; no motion artifact; normal heart/lung ratio. Stress Images:  There is decreased uptake in the anterior wall. Rest Images:  There is decreased uptake in the anterior wall. Subtraction (SDS):  There is a fixed defect that is most consistent with a previous infarction. Transient Ischemic Dilatation (Normal <1.22):  1.02 Lung/Heart Ratio (Normal <0.45):  0.35  Quantitative Gated Spect Images QGS EDV:  125 ml QGS ESV:  82 ml QGS cine images:  Anteroapical akinesis QGS EF: 34%  Impression Exercise Capacity:  Lexiscan with no exercise. BP Response:  Normal blood pressure response. Clinical Symptoms:  No chest pain. ECG Impression:  No significant ST segment change suggestive of ischemia. Comparison with Prior Nuclear Study: No images to compare  Overall Impression:  Large anteroapical MI with no ischemia  EF 34%   Charlton Haws

## 2011-06-19 ENCOUNTER — Telehealth: Payer: Self-pay | Admitting: *Deleted

## 2011-06-19 NOTE — Progress Notes (Signed)
Notify patient stress test is low risk.  Consistent with prior hx.  EF stable.  Please fax copy to surgeon along with my office note from earlier this week.  No further cardiovascular testing necessary.  He is moderate to high risk for cardiovascular complications. Tereso Newcomer, PA-C  2:38 PM 06/19/2011

## 2011-06-19 NOTE — Telephone Encounter (Signed)
Advised ok to proceed and forwarded to Dr Imogene Burn

## 2011-06-19 NOTE — Telephone Encounter (Signed)
Message copied by Burnell Blanks on Fri Jun 19, 2011  3:11 PM ------      Message from: [place], [place] T      Created: Fri Jun 19, 2011  2:39 PM       Low risk      Ok to proceed with surgery.      He is mod to high risk for CV complications      Tereso Newcomer, PA-C  2:38 PM 06/19/2011

## 2011-06-24 ENCOUNTER — Encounter (HOSPITAL_COMMUNITY): Payer: Self-pay | Admitting: Pharmacy Technician

## 2011-06-24 ENCOUNTER — Other Ambulatory Visit: Payer: Self-pay

## 2011-06-25 ENCOUNTER — Encounter: Payer: Self-pay | Admitting: Vascular Surgery

## 2011-06-26 ENCOUNTER — Encounter (HOSPITAL_COMMUNITY): Payer: Self-pay

## 2011-06-26 ENCOUNTER — Encounter (HOSPITAL_COMMUNITY)
Admission: RE | Admit: 2011-06-26 | Discharge: 2011-06-26 | Disposition: A | Discharge status: Home / Self Care | Payer: Medicare PPO | Source: Ambulatory Visit | Attending: Vascular Surgery | Admitting: Vascular Surgery

## 2011-06-26 ENCOUNTER — Encounter: Payer: Self-pay | Admitting: Vascular Surgery

## 2011-06-26 ENCOUNTER — Other Ambulatory Visit (INDEPENDENT_AMBULATORY_CARE_PROVIDER_SITE_OTHER): Payer: Medicare FFS | Admitting: *Deleted

## 2011-06-26 ENCOUNTER — Ambulatory Visit (INDEPENDENT_AMBULATORY_CARE_PROVIDER_SITE_OTHER): Payer: Medicare FFS | Admitting: Vascular Surgery

## 2011-06-26 VITALS — BP 102/63 | HR 71 | Temp 97.6°F | Resp 16 | Ht 67.0 in | Wt 136.0 lb

## 2011-06-26 DIAGNOSIS — I70219 Atherosclerosis of native arteries of extremities with intermittent claudication, unspecified extremity: Secondary | ICD-10-CM

## 2011-06-26 DIAGNOSIS — I6529 Occlusion and stenosis of unspecified carotid artery: Secondary | ICD-10-CM

## 2011-06-26 DIAGNOSIS — Z48812 Encounter for surgical aftercare following surgery on the circulatory system: Secondary | ICD-10-CM

## 2011-06-26 LAB — URINALYSIS, ROUTINE W REFLEX MICROSCOPIC
Bilirubin Urine: NEGATIVE
Ketones, ur: NEGATIVE mg/dL
Nitrite: NEGATIVE
pH: 6 (ref 5.0–8.0)

## 2011-06-26 LAB — COMPREHENSIVE METABOLIC PANEL
BUN: 22 mg/dL (ref 6–23)
CO2: 27 mEq/L (ref 19–32)
Chloride: 102 mEq/L (ref 96–112)
Creatinine, Ser: 1.3 mg/dL (ref 0.50–1.35)
GFR calc non Af Amer: 49 mL/min — ABNORMAL LOW (ref >90)
Total Bilirubin: 0.5 mg/dL (ref 0.3–1.2)

## 2011-06-26 LAB — TYPE AND SCREEN: Antibody Screen: NEGATIVE

## 2011-06-26 LAB — CBC
HCT: 37.1 % — ABNORMAL LOW (ref 39.0–52.0)
MCV: 97.4 fL (ref 78.0–100.0)
RBC: 3.81 MIL/uL — ABNORMAL LOW (ref 4.22–5.81)
WBC: 7.7 10*3/uL (ref 4.0–10.5)

## 2011-06-26 LAB — DIFFERENTIAL
Basophils Absolute: 0 10*3/uL (ref 0.0–0.1)
Basophils Relative: 0 % (ref 0–1)
Monocytes Absolute: 0.7 10*3/uL (ref 0.1–1.0)
Neutro Abs: 5.3 10*3/uL (ref 1.7–7.7)
Neutrophils Relative %: 69 % (ref 43–77)

## 2011-06-26 NOTE — Progress Notes (Signed)
[facility] [place]  Established Intermittent Claudication  History of Present Illness  Karl Stevenson is a 78 y.o. (10-07-1926) male who presents with chief complaint: continued L thigh pain.  His diagnostic angiogram demonstrated bilateral severe common femoral artery stenoses (L>>R).  His cardiac evaluation noted: moderate to high risk for CV complications.  Pt's L thigh pain is stable.  He denies any active angina.  The patient's treatment regimen currently included: maximal medical management.  Additionally, the patient presents for follow up on his L CEA (11/20/08).  The patient is having no stroke or TIA sx currently.  Past Medical History  Diagnosis Date  . Coronary artery disease     CABG 1987, redo 2008; Last LHC 1/10: SVG-OM 1/OM 2 patent, SVG-LAD patent, SVG-OM 3 Patent  . Hyperlipidemia   . Hypertension   . Carotid artery occlusion     s/p L CEA in 2010  . Crohn's disease     colon resection  . Anemia   . S/P CABG (coronary artery bypass graft)     1987 and 2008  . Bradycardia     Intolerant to beta blockers  . Gouty arthritis   . Renal artery stenosis     noted in 2010 per cath. 70-80% Left RAS; last duplex in August 2012 with 1-59% on the right, and no blockage on the left  . Chronic systolic heart failure     echo 7/12: EF 35-40%, anteroseptal hypokinesis, mild MR.    Past Surgical History  Procedure Date  . Carotid endarterectomy 2010    left  . Colon resection   . Cardiac catheterization 2010    Manage medically   . Coronary artery bypass graft 10/31/85 & 08/19/06  . Tonsillectomy     as a child    History   Social History  . Marital Status: Married    Spouse Name: N/A    Number of Children: N/A  . Years of Education: N/A   Occupational History  . Not on file.   Social History Main Topics  . Smoking status: Never Smoker   . Smokeless tobacco: Not on file  . Alcohol Use: No  . Drug Use: No  . Sexually Active: Not on file    Other Topics Concern  . Not on file   Social History Narrative  . No narrative on file    Family History  Problem Relation Age of Onset  . Heart disease Father   . Cancer Mother   . Anesthesia problems Neg Hx     Current Outpatient Prescriptions on File Prior to Visit  Medication Sig Dispense Refill  . amLODipine (NORVASC) 5 MG tablet Take 1 tablet (5 mg total) by mouth daily.  30 tablet  6  . aspirin 81 MG tablet Take 81 mg by mouth daily.       [name] atorvastatin (LIPITOR) 10 MG tablet Take 10 mg by mouth daily.        . clopidogrel (PLAVIX) 75 MG tablet Take 75 mg by mouth daily.      . fexofenadine (ALLEGRA) 180 MG tablet Take 180 mg by mouth daily as needed. allergies      . ipratropium (ATROVENT) 0.03 % nasal spray Place 2 sprays into the nose daily.       . isosorbide mononitrate (IMDUR) 60 MG 24 hr tablet Take 60 mg by mouth daily.      . Multiple Vitamin (MULITIVITAMIN WITH MINERALS) TABS Take 1 tablet by mouth  daily.      . nitroGLYCERIN (NITROSTAT) 0.4 MG SL tablet Place 0.4 mg under the tongue every 5 (five) minutes as needed. For chest pain      . pantoprazole (PROTONIX) 40 MG tablet Take 40 mg by mouth daily.      . valsartan (DIOVAN) 160 MG tablet Take 160 mg by mouth daily.         Allergies  Allergen Reactions  . Novocain Other (See Comments)    Cold sweats and headache.    Review of Systems (Positive items checked otherwise negative)  General: [ ]  Weight loss, [ ]  Weight gain, [ ]  Loss of appetite, [ ]  Fever   Neurologic: [ ]  Dizziness, [ ]  Blackouts, [ ]  Headaches, [ ]  Seizure   Ear/Nose/Throat: [ ]  Change in eyesight, [ ]  Change in hearing, [ ]  Nose bleeds, [ ]  Sore throat   Vascular: [x]  Pain in legs with walking, [ ]  Pain in feet while lying flat, [ ]  Non-healing ulcer, Stroke, [ ]  "Mini stroke", [ ]  Slurred speech, [ ]  Temporary blindness, [ ]  Blood clot in vein, [ ]  Phlebitis   Pulmonary: [ ]  Home oxygen, [ ]  Productive cough, [ ]  Bronchitis, [ ]   Coughing up blood, [ ]  Asthma, [ ]  Wheezing   Musculoskeletal: [x]  Arthritis, [x]  Joint pain, [ ]  Muscle pain   Cardiac: [ ]  Chest pain, [ ]  Chest tightness/pressure, [ ]  Shortness of breath when lying flat, [x]  Shortness of breath with exertion, [ ]  Palpitations, [ ]  Heart murmur, [ ]  Arrythmia, [ ]  Atrial fibrillation, [x]  bradycardia  Hematologic: [ ]  Bleeding problems, [ ]  Clotting disorder, [ ]  Anemia   Psychiatric: [ ]  Depression, [ ]  Anxiety, [ ]  Attention deficit disorder   Gastrointestinal: [ ]  Black stool,[ ]  Blood in stool, [ ]  Peptic ulcer disease, [ ]  Reflux,  [ ]  Hiatal hernia, [ ]  Trouble swallowing, [ ]  Diarrhea, [ ]  Constipation   Urinary: [ ]  Kidney disease, [ ]  Burning with urination, [ ]  Frequent urination, [ ]  Difficulty urinating   Skin: [ ]  Ulcers, [ ]  Rashes  Physical Examination  Filed Vitals:   06/26/11 1126  BP: 102/63  Pulse: 71  Temp: 97.6 F (36.4 C)  TempSrc: Oral  Resp: 16  Height: 5\' 7"  (1.702 m)  Weight: 136 lb (61.689 kg)   Body mass index is 21.30 kg/(m^2).  General: A&O x 3, WDWN, elderly   Pulmonary: Sym exp, good air movt, CTAB, no rales, rhonchi, & wheezing  Cardiac: RRR, Nl S1, S2, no Murmurs, rubs or gallops  Vascular: Vessel Right Left  Radial Palpable Palpable  Brachial Palpable Palpable  Carotid Palpable, without bruit Palpable, without bruit  Aorta Non-palpable N/A  Femoral Palpable Palpable  Popliteal Non-palpable Non-palpable  PT Palpable Non-Palpable  DP Palpable Non-Palpable   Musculoskeletal: M/S 5/5 throughout , Extremities without ischemic changes   Neurologic: Pain and light touch intact in extremities , Motor exam as listed above  Non-Invasive Vascular Imaging B carotid duplex (Date: 06/26/11)  R: 0-39%  L: widely patent s/p CEA  B VA antegrade and patent  Medical Decision Making  Karl Stevenson is a 78 y.o. male who presents with: B (L>>R) leg intermittent claudication without evidence of critical  limb ischemia.  Based on the patient's vascular studies and examination, I have offered the patient: L iliofemoral endarterectomy with Bovine Patch angioplasty.  This patient is at risk for L leg acute ischemia if the severe stenosis  in the left common femoral artery occludes.  Given his cardiac risk, I recommend we operate on the left then then the right after he recovers.  He will scheduled for this coming Wednesday 20 FEB 13.  I discussed in depth with the patient the nature of atherosclerosis, and emphasized the importance of maximal medical management including strict control of blood pressure, blood glucose, and lipid levels, antiplatelet agents, obtaining regular exercise, and cessation of smoking.  The patient is aware that without maximal medical management the underlying atherosclerotic disease process will progress, limiting the benefit of any interventions. The risk, benefits, and alternative for an endarterectomy was discussed with the patient.  The patient is aware the risks include but are not limited to: bleeding, infection, myocardial infarction, stroke, limb loss, nerve damage, need for additional procedures in the future, wound complications, and inability to complete the endarterectomy and need for interposition graft.  The patient is aware of these risks and agreed to proceed.  Thank you for allowing [place] to participate in this patient's care.  Leonides Sake, MD [facility] [place] Office: [phone] Pager: [phone]  06/26/2011, 1:55 PM

## 2011-06-26 NOTE — Progress Notes (Signed)
Spoke with Revonda Standard, [place] regarding cardiac notes and stress test. Will send chart to anesthesia for review

## 2011-06-26 NOTE — Pre-Procedure Instructions (Addendum)
20 Karl Stevenson  06/26/2011   Your procedure is scheduled on:  July 01, 2011  Report to [facility] at 0630 AM.  Call this number if you have problems the morning of surgery: [phone]   Remember:   Do not eat food:After Midnight.  May have clear liquids: up to 4 Hours before arrival.  Clear liquids include soda, tea, black coffee, apple or grape juice, broth.  Take these medicines the morning of surgery with A SIP OF WATER: Norvasc, Atrovent, Imdur, Protonix, Ranexa,     Stop Multiple Vitamins follow Dr. Nicky Pugh order regarding Plavix  Do not wear jewelry, make-up or nail polish.  Do not wear lotions, powders, or perfumes. You may wear deodorant.  Do not shave 48 hours prior to surgery.  Do not bring valuables to the hospital.  Contacts, dentures or bridgework may not be worn into surgery.  Leave suitcase in the car. After surgery it may be brought to your room.  For patients admitted to the hospital, checkout time is 11:00 AM the day of discharge.   Patients discharged the day of surgery will not be allowed to drive home.  Name and phone number of your driver: Grey Rakestraw   Special Instructions: CHG Shower Use Special Wash: 1/2 bottle night before surgery and 1/2 bottle morning of surgery.   Please read over the following fact sheets that you were given: Pain Booklet, Coughing and Deep Breathing, Blood Transfusion Information, MRSA Information and Surgical Site Infection Prevention

## 2011-06-29 ENCOUNTER — Encounter (HOSPITAL_COMMUNITY): Payer: Self-pay | Admitting: Vascular Surgery

## 2011-06-29 NOTE — Procedures (Unsigned)
CAROTID DUPLEX EXAM  INDICATION:  Follow up carotid artery disease, right carotid bruit.  HISTORY: Diabetes:  No. Cardiac:  CABG. Hypertension:  Yes. Smoking:  No. Previous Surgery:  No. CV History:  Left carotid endarterectomy 11/20/2008. Amaurosis Fugax No, Paresthesias No, Hemiparesis No                                      RIGHT             LEFT Brachial systolic pressure:         116               108 Brachial Doppler waveforms:         Biphasic          Triphasic Vertebral direction of flow:        Antegrade         Antegrade DUPLEX VELOCITIES (cm/sec) CCA peak systolic                   77                85 ECA peak systolic                   120               106 ICA peak systolic                   86                84 ICA end diastolic                   25                23 PLAQUE MORPHOLOGY:                  Soft              Soft PLAQUE AMOUNT:                      Minimal           Minimal PLAQUE LOCATION:                    ICA               CCA, ICA  IMPRESSION: 1. 0-39% right internal carotid artery stenosis. 2. Patent left carotid endarterectomy site with no evidence for re-     stenosis. 3. Vertebral artery flow is antegrade bilaterally.  ___________________________________________ [name], MD  SS/MEDQ  D:  06/26/2011  T:  06/26/2011  Job:  [id number]

## 2011-06-29 NOTE — Consult Note (Signed)
Anesthesia:  Patient is a 78 year old male scheduled for left iliofemoral endarterectomy on 07/01/11.  His history includes CAD s/p CABG '87 and '08, ischemic CM, chronic systolic HF, renal artery stenosis, bradycardia (cannot tolerate beta blockers), Crohn's disease s/p colon resection, anemia, HLD, HTN, history of left CEA 2010, non-smoker.  His primary Cardiologist is now Dr. Excell Seltzer (previously Dr. Deborah Chalk).  He was seen on 06/16/11 by his PA Tereso Newcomer for pre-operative Cardiac evaluation.  EKG then showed SR with occasional PVCs and fusion complexes, right BBB, LAFB, bifascicular block, anteroseptal infarct.  He had a rest Lexiscan Myoview on 06/19/11 that showed: Large anteroapical MI with no ischemia EF 34%.  Overall the results were felt low risk as it was consistent with his prior history and his EF was stable. No further cardiovascular testing was felt necessary pre-operatively.  He was however felt moderate to high risk for cardiovascular complications.   Echo from 11/24/10 showed LV systolic function moderately reduced, EF 35-40%, akinesis on the mid-distal anteroseptal myocardium, mild MR.  His last heart cath was on 05/15/08 and showed: 1. Severe obstructive native vessel disease.  2. Patent vein graft to first obtuse marginal/second obtuse marginal (sequential graft). Patent vein graft to left anterior descending and patent vein graft to third obtuse marginal.  3. 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  4. A 70-80% stenosis of the left renal artery.  5. Moderate diffuse iliac and femoral atherosclerosis.  6. EF 40%.  Continued medical therapy was recommended at that time.  CXR shows COPD without acute findings.  Labs acceptable.  Plan to proceed if no acute CV symptoms.

## 2011-06-30 MED ORDER — SODIUM CHLORIDE 0.9 % IV SOLN: INTRAVENOUS | Status: DC

## 2011-06-30 MED ORDER — CEFUROXIME SODIUM 1.5 G IJ SOLR
1.5000 g | Freq: Once | INTRAMUSCULAR | Status: AC
  Administered 2011-07-01: 1.5 g via INTRAVENOUS
  Filled 2011-06-30: qty 1.5

## 2011-07-01 ENCOUNTER — Encounter (HOSPITAL_COMMUNITY): Payer: Self-pay | Admitting: *Deleted

## 2011-07-01 ENCOUNTER — Encounter (HOSPITAL_COMMUNITY): Payer: Self-pay | Admitting: Vascular Surgery

## 2011-07-01 ENCOUNTER — Ambulatory Visit (HOSPITAL_COMMUNITY): Payer: Medicare PPO | Admitting: Vascular Surgery

## 2011-07-01 ENCOUNTER — Other Ambulatory Visit: Payer: Self-pay | Admitting: Vascular Surgery

## 2011-07-01 ENCOUNTER — Encounter (HOSPITAL_COMMUNITY)
Admission: RE | Disposition: A | Discharge status: Home / Self Care | Payer: Self-pay | Source: Ambulatory Visit | Attending: Vascular Surgery

## 2011-07-01 ENCOUNTER — Inpatient Hospital Stay (HOSPITAL_COMMUNITY)
Admission: RE | Admit: 2011-07-01 | Discharge: 2011-07-03 | DRG: 253 | Disposition: A | Discharge status: Home / Self Care | Payer: Medicare PPO | Source: Ambulatory Visit | Attending: Vascular Surgery | Admitting: Vascular Surgery

## 2011-07-01 DIAGNOSIS — E785 Hyperlipidemia, unspecified: Secondary | ICD-10-CM | POA: Diagnosis present

## 2011-07-01 DIAGNOSIS — Z951 Presence of aortocoronary bypass graft: Secondary | ICD-10-CM

## 2011-07-01 DIAGNOSIS — I743 Embolism and thrombosis of arteries of the lower extremities: Secondary | ICD-10-CM

## 2011-07-01 DIAGNOSIS — I10 Essential (primary) hypertension: Secondary | ICD-10-CM | POA: Diagnosis present

## 2011-07-01 DIAGNOSIS — I252 Old myocardial infarction: Secondary | ICD-10-CM

## 2011-07-01 DIAGNOSIS — Z9049 Acquired absence of other specified parts of digestive tract: Secondary | ICD-10-CM

## 2011-07-01 DIAGNOSIS — I498 Other specified cardiac arrhythmias: Secondary | ICD-10-CM | POA: Diagnosis present

## 2011-07-01 DIAGNOSIS — Z7982 Long term (current) use of aspirin: Secondary | ICD-10-CM

## 2011-07-01 DIAGNOSIS — I2510 Atherosclerotic heart disease of native coronary artery without angina pectoris: Secondary | ICD-10-CM | POA: Diagnosis present

## 2011-07-01 DIAGNOSIS — I2589 Other forms of chronic ischemic heart disease: Secondary | ICD-10-CM | POA: Diagnosis present

## 2011-07-01 DIAGNOSIS — Z7902 Long term (current) use of antithrombotics/antiplatelets: Secondary | ICD-10-CM

## 2011-07-01 DIAGNOSIS — I5022 Chronic systolic (congestive) heart failure: Secondary | ICD-10-CM | POA: Diagnosis present

## 2011-07-01 DIAGNOSIS — D649 Anemia, unspecified: Secondary | ICD-10-CM | POA: Diagnosis present

## 2011-07-01 DIAGNOSIS — K5090 Crohn's disease, unspecified, without complications: Secondary | ICD-10-CM | POA: Diagnosis present

## 2011-07-01 DIAGNOSIS — I70209 Unspecified atherosclerosis of native arteries of extremities, unspecified extremity: Principal | ICD-10-CM | POA: Diagnosis present

## 2011-07-01 DIAGNOSIS — I701 Atherosclerosis of renal artery: Secondary | ICD-10-CM | POA: Diagnosis present

## 2011-07-01 DIAGNOSIS — M109 Gout, unspecified: Secondary | ICD-10-CM | POA: Diagnosis present

## 2011-07-01 DIAGNOSIS — Z01812 Encounter for preprocedural laboratory examination: Secondary | ICD-10-CM

## 2011-07-01 DIAGNOSIS — Z79899 Other long term (current) drug therapy: Secondary | ICD-10-CM

## 2011-07-01 LAB — CBC
HCT: 29.4 % — ABNORMAL LOW (ref 39.0–52.0)
Hemoglobin: 10.2 g/dL — ABNORMAL LOW (ref 13.0–17.0)
RDW: 12.8 % (ref 11.5–15.5)
WBC: 9 10*3/uL (ref 4.0–10.5)

## 2011-07-01 LAB — CREATININE, SERUM
GFR calc Af Amer: 64 mL/min — ABNORMAL LOW (ref >90)
GFR calc non Af Amer: 55 mL/min — ABNORMAL LOW (ref >90)

## 2011-07-01 SURGERY — ENDARTERECTOMY, FEMORAL
Anesthesia: General | Site: Leg Upper | Laterality: Left | Wound class: Clean

## 2011-07-01 MED ORDER — NEOSTIGMINE METHYLSULFATE 1 MG/ML IJ SOLN
INTRAMUSCULAR | Status: DC | PRN
  Administered 2011-07-01: 2.5 mg via INTRAVENOUS

## 2011-07-01 MED ORDER — AMLODIPINE BESYLATE 5 MG PO TABS
5.0000 mg | ORAL_TABLET | Freq: Every day | ORAL | Status: DC
  Administered 2011-07-03: 5 mg via ORAL
  Filled 2011-07-01 (×2): qty 1

## 2011-07-01 MED ORDER — MORPHINE SULFATE 2 MG/ML IJ SOLN: 2.0000 mg | INTRAMUSCULAR | Status: DC | PRN

## 2011-07-01 MED ORDER — GUAIFENESIN-DM 100-10 MG/5ML PO SYRP: 15.0000 mL | ORAL_SOLUTION | ORAL | Status: DC | PRN

## 2011-07-01 MED ORDER — ENOXAPARIN SODIUM 40 MG/0.4ML ~~LOC~~ SOLN
40.0000 mg | SUBCUTANEOUS | Status: DC
  Administered 2011-07-02 – 2011-07-03 (×2): 40 mg via SUBCUTANEOUS
  Filled 2011-07-01 (×4): qty 0.4

## 2011-07-01 MED ORDER — POTASSIUM CHLORIDE CRYS ER 20 MEQ PO TBCR: 20.0000 meq | EXTENDED_RELEASE_TABLET | Freq: Once | ORAL | Status: AC | PRN

## 2011-07-01 MED ORDER — 0.9 % SODIUM CHLORIDE (POUR BTL) OPTIME
TOPICAL | Status: DC | PRN
  Administered 2011-07-01: 1000 mL

## 2011-07-01 MED ORDER — MORPHINE SULFATE 2 MG/ML IJ SOLN: 0.0500 mg/kg | INTRAMUSCULAR | Status: DC | PRN

## 2011-07-01 MED ORDER — MEPERIDINE HCL 25 MG/ML IJ SOLN: 6.2500 mg | INTRAMUSCULAR | Status: DC | PRN

## 2011-07-01 MED ORDER — ALUM & MAG HYDROXIDE-SIMETH 400-400-40 MG/5ML PO SUSP: 15.0000 mL | ORAL | Status: DC | PRN

## 2011-07-01 MED ORDER — HYDROCHLOROTHIAZIDE 12.5 MG PO CAPS
12.5000 mg | ORAL_CAPSULE | Freq: Every day | ORAL | Status: DC
  Administered 2011-07-01 – 2011-07-03 (×3): 12.5 mg via ORAL
  Filled 2011-07-01 (×4): qty 1

## 2011-07-01 MED ORDER — PROTAMINE SULFATE 10 MG/ML IV SOLN
INTRAVENOUS | Status: DC | PRN
  Administered 2011-07-01 (×3): 10 mg via INTRAVENOUS

## 2011-07-01 MED ORDER — PROPOFOL 10 MG/ML IV EMUL
INTRAVENOUS | Status: DC | PRN
  Administered 2011-07-01: 120 mg via INTRAVENOUS

## 2011-07-01 MED ORDER — ALUM & MAG HYDROXIDE-SIMETH 200-200-20 MG/5ML PO SUSP: 15.0000 mL | ORAL | Status: DC | PRN

## 2011-07-01 MED ORDER — HYDRALAZINE HCL 20 MG/ML IJ SOLN
10.0000 mg | INTRAMUSCULAR | Status: DC | PRN
  Filled 2011-07-01: qty 0.5

## 2011-07-01 MED ORDER — FAMOTIDINE IN NACL 20-0.9 MG/50ML-% IV SOLN
20.0000 mg | Freq: Every day | INTRAVENOUS | Status: DC
  Administered 2011-07-01 – 2011-07-02 (×2): 20 mg via INTRAVENOUS
  Filled 2011-07-01 (×4): qty 50

## 2011-07-01 MED ORDER — ROCURONIUM BROMIDE 100 MG/10ML IV SOLN
INTRAVENOUS | Status: DC | PRN
  Administered 2011-07-01: 5 mg via INTRAVENOUS
  Administered 2011-07-01: 50 mg via INTRAVENOUS
  Administered 2011-07-01: 5 mg via INTRAVENOUS

## 2011-07-01 MED ORDER — IPRATROPIUM BROMIDE 0.03 % NA SOLN
2.0000 | Freq: Every day | NASAL | Status: DC
  Administered 2011-07-02: 2 via NASAL
  Filled 2011-07-01: qty 30

## 2011-07-01 MED ORDER — DEXTROSE 5 % IV SOLN
1.5000 g | Freq: Two times a day (BID) | INTRAVENOUS | Status: AC
  Administered 2011-07-01 – 2011-07-02 (×2): 1.5 g via INTRAVENOUS
  Filled 2011-07-01 (×2): qty 1.5

## 2011-07-01 MED ORDER — PANTOPRAZOLE SODIUM 40 MG PO TBEC
40.0000 mg | DELAYED_RELEASE_TABLET | Freq: Every day | ORAL | Status: DC
  Administered 2011-07-02 – 2011-07-03 (×2): 40 mg via ORAL
  Filled 2011-07-01: qty 1

## 2011-07-01 MED ORDER — THROMBIN 20000 UNITS EX KIT: PACK | OROMUCOSAL | Status: DC | PRN

## 2011-07-01 MED ORDER — GLYCOPYRROLATE 0.2 MG/ML IJ SOLN
INTRAMUSCULAR | Status: DC | PRN
  Administered 2011-07-01: .5 mg via INTRAVENOUS

## 2011-07-01 MED ORDER — ISOSORBIDE MONONITRATE ER 60 MG PO TB24
60.0000 mg | ORAL_TABLET | Freq: Every day | ORAL | Status: DC
  Administered 2011-07-03: 60 mg via ORAL
  Filled 2011-07-01 (×2): qty 1

## 2011-07-01 MED ORDER — CLOPIDOGREL BISULFATE 75 MG PO TABS
75.0000 mg | ORAL_TABLET | Freq: Every day | ORAL | Status: DC
  Administered 2011-07-02 – 2011-07-03 (×2): 75 mg via ORAL
  Filled 2011-07-01 (×4): qty 1

## 2011-07-01 MED ORDER — SIMVASTATIN 20 MG PO TABS
20.0000 mg | ORAL_TABLET | Freq: Every day | ORAL | Status: DC
  Administered 2011-07-01 – 2011-07-02 (×2): 20 mg via ORAL
  Filled 2011-07-01 (×4): qty 1

## 2011-07-01 MED ORDER — OLMESARTAN MEDOXOMIL 20 MG PO TABS
20.0000 mg | ORAL_TABLET | Freq: Every day | ORAL | Status: DC
  Administered 2011-07-01 – 2011-07-03 (×3): 20 mg via ORAL
  Filled 2011-07-01 (×4): qty 1

## 2011-07-01 MED ORDER — SODIUM CHLORIDE 0.9 % IV SOLN
INTRAVENOUS | Status: DC
  Administered 2011-07-01: 14:00:00 via INTRAVENOUS

## 2011-07-01 MED ORDER — METOPROLOL TARTRATE 1 MG/ML IV SOLN: 2.0000 mg | INTRAVENOUS | Status: DC | PRN

## 2011-07-01 MED ORDER — PHENOL 1.4 % MT LIQD: 1.0000 | OROMUCOSAL | Status: DC | PRN

## 2011-07-01 MED ORDER — RANOLAZINE ER 500 MG PO TB12
500.0000 mg | ORAL_TABLET | Freq: Two times a day (BID) | ORAL | Status: DC
  Administered 2011-07-02 – 2011-07-03 (×3): 500 mg via ORAL
  Filled 2011-07-01 (×6): qty 1

## 2011-07-01 MED ORDER — PANTOPRAZOLE SODIUM 40 MG PO TBEC: 40.0000 mg | DELAYED_RELEASE_TABLET | Freq: Every day | ORAL | Status: DC

## 2011-07-01 MED ORDER — LIDOCAINE HCL (CARDIAC) 20 MG/ML IV SOLN
INTRAVENOUS | Status: DC | PRN
  Administered 2011-07-01: 80 mg via INTRAVENOUS

## 2011-07-01 MED ORDER — LORATADINE 10 MG PO TABS
10.0000 mg | ORAL_TABLET | Freq: Every day | ORAL | Status: DC
  Administered 2011-07-01 – 2011-07-03 (×3): 10 mg via ORAL
  Filled 2011-07-01 (×4): qty 1

## 2011-07-01 MED ORDER — ACETAMINOPHEN 650 MG RE SUPP: 325.0000 mg | RECTAL | Status: DC | PRN

## 2011-07-01 MED ORDER — HEPARIN SODIUM (PORCINE) 1000 UNIT/ML IJ SOLN
INTRAMUSCULAR | Status: DC | PRN
  Administered 2011-07-01: 2000 [IU] via INTRAVENOUS
  Administered 2011-07-01: 5000 [IU] via INTRAVENOUS

## 2011-07-01 MED ORDER — CLOPIDOGREL BISULFATE 75 MG PO TABS: 75.0000 mg | ORAL_TABLET | Freq: Every day | ORAL | Status: DC

## 2011-07-01 MED ORDER — ADULT MULTIVITAMIN W/MINERALS CH
1.0000 | ORAL_TABLET | Freq: Every day | ORAL | Status: DC
  Administered 2011-07-02 – 2011-07-03 (×2): 1 via ORAL
  Filled 2011-07-01 (×2): qty 1

## 2011-07-01 MED ORDER — COLCHICINE 0.6 MG PO TABS
0.6000 mg | ORAL_TABLET | Freq: Every day | ORAL | Status: DC
  Administered 2011-07-02 – 2011-07-03 (×2): 0.6 mg via ORAL
  Filled 2011-07-01 (×2): qty 1

## 2011-07-01 MED ORDER — FENTANYL CITRATE 0.05 MG/ML IJ SOLN
INTRAMUSCULAR | Status: DC | PRN
  Administered 2011-07-01: 100 ug via INTRAVENOUS
  Administered 2011-07-01: 25 ug via INTRAVENOUS
  Administered 2011-07-01: 50 ug via INTRAVENOUS

## 2011-07-01 MED ORDER — DOPAMINE-DEXTROSE 3.2-5 MG/ML-% IV SOLN: 3.0000 ug/kg/min | INTRAVENOUS | Status: DC

## 2011-07-01 MED ORDER — HYDROMORPHONE HCL PF 1 MG/ML IJ SOLN: 0.2500 mg | INTRAMUSCULAR | Status: DC | PRN

## 2011-07-01 MED ORDER — ACETAMINOPHEN 325 MG PO TABS: 325.0000 mg | ORAL_TABLET | ORAL | Status: DC | PRN

## 2011-07-01 MED ORDER — NITROGLYCERIN 0.4 MG SL SUBL: 0.4000 mg | SUBLINGUAL_TABLET | SUBLINGUAL | Status: DC | PRN

## 2011-07-01 MED ORDER — SODIUM CHLORIDE 0.9 % IV SOLN
500.0000 mL | Freq: Once | INTRAVENOUS | Status: AC | PRN
  Administered 2011-07-01: 500 mL via INTRAVENOUS

## 2011-07-01 MED ORDER — THROMBIN 20000 UNITS EX KIT
PACK | CUTANEOUS | Status: DC | PRN
  Administered 2011-07-01: 10:00:00 via TOPICAL

## 2011-07-01 MED ORDER — ONDANSETRON HCL 4 MG/2ML IJ SOLN: 4.0000 mg | Freq: Four times a day (QID) | INTRAMUSCULAR | Status: DC | PRN

## 2011-07-01 MED ORDER — ONDANSETRON HCL 4 MG/2ML IJ SOLN
INTRAMUSCULAR | Status: DC | PRN
  Administered 2011-07-01: 4 mg via INTRAVENOUS

## 2011-07-01 MED ORDER — OXYCODONE HCL 5 MG PO TABS: 5.0000 mg | ORAL_TABLET | ORAL | Status: DC | PRN

## 2011-07-01 MED ORDER — ASPIRIN 81 MG PO TABS: 81.0000 mg | ORAL_TABLET | Freq: Once | ORAL | Status: DC

## 2011-07-01 MED ORDER — SODIUM CHLORIDE 0.9 % IR SOLN
Status: DC | PRN
  Administered 2011-07-01: 08:00:00

## 2011-07-01 MED ORDER — LABETALOL HCL 5 MG/ML IV SOLN
10.0000 mg | INTRAVENOUS | Status: DC | PRN
  Filled 2011-07-01: qty 4

## 2011-07-01 MED ORDER — NIACIN ER (ANTIHYPERLIPIDEMIC) 500 MG PO TBCR: 1000.0000 mg | EXTENDED_RELEASE_TABLET | Freq: Every day | ORAL | Status: DC

## 2011-07-01 MED ORDER — LACTATED RINGERS IV SOLN
INTRAVENOUS | Status: DC | PRN
  Administered 2011-07-01 (×2): via INTRAVENOUS

## 2011-07-01 MED ORDER — MAGNESIUM SULFATE 40 MG/ML IJ SOLN
2.0000 g | Freq: Once | INTRAMUSCULAR | Status: AC | PRN
  Filled 2011-07-01: qty 50

## 2011-07-01 MED ORDER — DOCUSATE SODIUM 100 MG PO CAPS
100.0000 mg | ORAL_CAPSULE | Freq: Every day | ORAL | Status: DC
  Administered 2011-07-02 – 2011-07-03 (×2): 100 mg via ORAL
  Filled 2011-07-01: qty 1

## 2011-07-01 MED ORDER — ASPIRIN 81 MG PO CHEW
81.0000 mg | CHEWABLE_TABLET | Freq: Every day | ORAL | Status: DC
  Administered 2011-07-01 – 2011-07-03 (×3): 81 mg via ORAL
  Filled 2011-07-01: qty 1

## 2011-07-01 MED ORDER — PHENYLEPHRINE HCL 10 MG/ML IJ SOLN
10.0000 mg | INTRAVENOUS | Status: DC | PRN
  Administered 2011-07-01: 20 ug/min via INTRAVENOUS

## 2011-07-01 MED ORDER — ONDANSETRON HCL 4 MG/2ML IJ SOLN: 4.0000 mg | Freq: Once | INTRAMUSCULAR | Status: DC | PRN

## 2011-07-01 SURGICAL SUPPLY — 51 items
ADH SKN CLS LQ APL DERMABOND (GAUZE/BANDAGES/DRESSINGS) ×1
BANDAGE ELASTIC 4 VELCRO ST LF (GAUZE/BANDAGES/DRESSINGS) IMPLANT
CANISTER SUCTION 2500CC (MISCELLANEOUS) ×2 IMPLANT
CLIP TI MEDIUM 24 (CLIP) ×2 IMPLANT
CLIP TI WIDE RED SMALL 24 (CLIP) ×2 IMPLANT
CLOTH BEACON ORANGE TIMEOUT ST (SAFETY) ×2 IMPLANT
CONT SPEC 4OZ CLIKSEAL STRL BL (MISCELLANEOUS) ×1 IMPLANT
COVER SURGICAL LIGHT HANDLE (MISCELLANEOUS) ×4 IMPLANT
DERMABOND ADHESIVE PROPEN (GAUZE/BANDAGES/DRESSINGS) ×1
DERMABOND ADVANCED .7 DNX6 (GAUZE/BANDAGES/DRESSINGS) IMPLANT
DRAIN CHANNEL 15F RND FF W/TCR (WOUND CARE) IMPLANT
DRAPE WARM FLUID 44X44 (DRAPE) ×2 IMPLANT
DRSG COVADERM 4X8 (GAUZE/BANDAGES/DRESSINGS) ×1 IMPLANT
ELECT REM PT RETURN 9FT ADLT (ELECTROSURGICAL) ×2
ELECTRODE REM PT RTRN 9FT ADLT (ELECTROSURGICAL) ×1 IMPLANT
EVACUATOR SILICONE 100CC (DRAIN) IMPLANT
GLOVE BIO SURGEON STRL SZ 6 (GLOVE) ×1 IMPLANT
GLOVE BIO SURGEON STRL SZ7 (GLOVE) ×2 IMPLANT
GLOVE BIOGEL PI IND STRL 7.0 (GLOVE) IMPLANT
GLOVE BIOGEL PI IND STRL 7.5 (GLOVE) ×1 IMPLANT
GLOVE BIOGEL PI INDICATOR 7.0 (GLOVE) ×5
GLOVE BIOGEL PI INDICATOR 7.5 (GLOVE) ×2
GLOVE SS BIOGEL STRL SZ 6 (GLOVE) IMPLANT
GLOVE SS BIOGEL STRL SZ 7 (GLOVE) IMPLANT
GLOVE SUPERSENSE BIOGEL SZ 6 (GLOVE) ×1
GLOVE SUPERSENSE BIOGEL SZ 7 (GLOVE) ×1
GOWN STRL NON-REIN LRG LVL3 (GOWN DISPOSABLE) ×7 IMPLANT
KIT BASIN OR (CUSTOM PROCEDURE TRAY) ×2 IMPLANT
KIT ROOM TURNOVER OR (KITS) ×2 IMPLANT
LOOP VESSEL MINI RED (MISCELLANEOUS) ×1 IMPLANT
NS IRRIG 1000ML POUR BTL (IV SOLUTION) ×5 IMPLANT
PACK PERIPHERAL VASCULAR (CUSTOM PROCEDURE TRAY) ×2 IMPLANT
PACK UNIVERSAL I (CUSTOM PROCEDURE TRAY) ×1 IMPLANT
PAD ARMBOARD 7.5X6 YLW CONV (MISCELLANEOUS) ×4 IMPLANT
PATCH VASCULAR VASCU GUARD 1X6 (Vascular Products) ×1 IMPLANT
SPONGE LAP 18X18 X RAY DECT (DISPOSABLE) ×1 IMPLANT
SPONGE SURGIFOAM ABS GEL 100 (HEMOSTASIS) ×1 IMPLANT
STAPLER VISISTAT 35W (STAPLE) IMPLANT
SUT MNCRL AB 4-0 PS2 18 (SUTURE) ×2 IMPLANT
SUT PROLENE 5 0 C 1 24 (SUTURE) ×3 IMPLANT
SUT PROLENE 6 0 BV (SUTURE) ×3 IMPLANT
SUT PROLENE 7 0 BV1 MDA (SUTURE) ×5 IMPLANT
SUT VIC AB 2-0 CT1 27 (SUTURE) ×2
SUT VIC AB 2-0 CT1 TAPERPNT 27 (SUTURE) ×1 IMPLANT
SUT VIC AB 3-0 SH 27 (SUTURE) ×2
SUT VIC AB 3-0 SH 27X BRD (SUTURE) ×1 IMPLANT
TOWEL OR 17X24 6PK STRL BLUE (TOWEL DISPOSABLE) ×4 IMPLANT
TOWEL OR 17X26 10 PK STRL BLUE (TOWEL DISPOSABLE) ×2 IMPLANT
TRAY FOLEY CATH 14FRSI W/METER (CATHETERS) ×2 IMPLANT
UNDERPAD 30X30 INCONTINENT (UNDERPADS AND DIAPERS) ×2 IMPLANT
WATER STERILE IRR 1000ML POUR (IV SOLUTION) ×2 IMPLANT

## 2011-07-01 NOTE — H&P (View-Only) (Signed)
[facility] [place]  Referred by:  Daniel G Paterson, MD [address] [facility] [place], [place] [address]  Reason for referral: Left thigh pain  History of Present Illness  Karl Stevenson is a 78 y.o. male who presents with chief complaint: left thigh pain.  Onset of symptom occurred 1 month ago.  Pain is described as aching and occasionally sharp, severity 4-6/10, and associated with short distance ambulation.  Patient has attempted to treat this pain with rest.  The patient has never rest pain symptoms also and never had leg wounds/ulcers.  Atherosclerotic risk factors include: hyperlipidemia and HTN.  Past Medical History  Diagnosis Date  . Coronary artery disease   . Hyperlipidemia   . Hypertension   . Carotid artery occlusion     s/p L CEA in 2010  . Crohn's disease     colon resection  . Anemia   . MI, old     age 41  . S/P CABG (coronary artery bypass graft) 1987  . S/P CABG (coronary artery bypass graft) April 2008  . S/P cardiac cath 2010    Manage medically  . Bradycardia     Intolerant to beta blockers  . Gouty arthritis   . Renal artery stenosis     noted in 2010 per cath. 70-80% Left RAS; last duplex in August 2012 with 1-59% on the right, and no blockage on the left  . Systolic HF (heart failure)     EF 35 to 40% PER ECHO IN 2008; 40% PER CATH IN 2010  . Myocardial infarction 09/04/67    Past Surgical History  Procedure Date  . Carotid endarterectomy 2010    left  . Colon resection   . Cardiac catheterization 2010    Manage medically   . Coronary artery bypass graft 10/31/85 & 08/19/06    History   Social History  . Marital Status: Married    Spouse Name: N/A    Number of Children: N/A  . Years of Education: N/A   Occupational History  . Not on file.   Social History Main Topics  . Smoking status: Never Smoker   . Smokeless tobacco: Not on file  . Alcohol Use: No  . Drug Use: No  . Sexually  Active: Not on file   Other Topics Concern  . Not on file   Social History Narrative  . No narrative on file    Family History  Problem Relation Age of Onset  . Heart disease Father   . Cancer Mother     Current Outpatient Prescriptions on File Prior to Visit  Medication Sig Dispense Refill  . amLODipine (NORVASC) 5 MG tablet Take 1 tablet (5 mg total) by mouth daily.  30 tablet  6  . aspirin 81 MG tablet Take 81 mg by mouth 2 (two) times daily.       . atorvastatin (LIPITOR) 10 MG tablet Take 10 mg by mouth daily.        . bismuth subsalicylate (KAOPECTATE) 262 MG/15ML suspension Take 15 mLs by mouth as needed.        . DIOVAN 160 MG tablet TAKE ONE TABLET BY MOUTH EVERY DAY  90 each  3  . fexofenadine (ALLEGRA) 180 MG tablet Take 180 mg by mouth daily.        . ipratropium (ATROVENT) 0.03 % nasal spray Place 2 sprays into the nose as directed. One or two sprays each nostril bid       .   isosorbide mononitrate (IMDUR) 60 MG 24 hr tablet TAKE ONE TABLET BY MOUTH EVERY DAY  90 tablet  3  . Multiple Vitamins-Minerals (CENTRUM PO) Take by mouth.        . NIASPAN 500 MG CR tablet TAKE ONE TABLET BY MOUTH EVERY DAY.  90 each  1  . nitroGLYCERIN (NITROSTAT) 0.4 MG SL tablet Take 0.4 mg by mouth every 5 (five) minutes as needed.        . pantoprazole (PROTONIX) 40 MG tablet TAKE ONE TABLET BY MOUTH EVERY DAY  90 tablet  3  . PLAVIX 75 MG tablet TAKE ONE TABLET BY MOUTH EVERY DAY  90 each  3  . RANEXA 500 MG 12 hr tablet TAKE ONE TABLET BY MOUTH TWICE DAILY  180 each  3    Allergies  Allergen Reactions  . Novocain      Review of Systems (Positive items checked otherwise negative)  General: [ ] Weight loss, [ ] Weight gain, [ ]  Loss of appetite, [ ] Fever  Neurologic: [ ] Dizziness, [ ] Blackouts, [ ] Headaches, [ ] Seizure  Ear/Nose/Throat: [ ] Change in eyesight, [ ] Change in hearing, [ ] Nose bleeds, [ ] Sore throat  Vascular: [ ] Pain in legs with walking, [ ] Pain in feet  while lying flat, [ ] Non-healing ulcer, Stroke, [ ] "Mini stroke", [ ] Slurred speech, [ ] Temporary blindness, [ ] Blood clot in vein, [ ] Phlebitis  Pulmonary: [ ] Home oxygen, [ ] Productive cough, [ ] Bronchitis, [ ] Coughing up blood,  [ ] Asthma, [ ] Wheezing  Musculoskeletal: [ ] Arthritis, [ ] Joint pain, [ ] Muscle pain  Cardiac: [ ] Chest pain, [ ] Chest tightness/pressure, [ ] Shortness of breath when lying flat, [ ] Shortness of breath with exertion, [ ] Palpitations, [ ] Heart murmur, [ ] Arrythmia,  [ ] Atrial fibrillation  Hematologic: [ ] Bleeding problems, [ ] Clotting disorder, [ ] Anemia  Psychiatric:  [ ] Depression, [ ] Anxiety, [ ] Attention deficit disorder  Gastrointestinal:  [ ] Black stool,[ ]  Blood in stool, [ ] Peptic ulcer disease, [ ] Reflux, [ ] Hiatal hernia, [ ] Trouble swallowing, [ ] Diarrhea, [ ] Constipation  Urinary:  [ ] Kidney disease, [ ] Burning with urination, [ ] Frequent urination, [ ] Difficulty urinating  Skin: [ ] Ulcers, [ ] Rashes   Physical Examination  Filed Vitals:   06/05/11 0918 06/05/11 0919  BP: 144/76 140/80  Pulse: 72 69  Resp: 15   Height: 5' 7" (1.702 m)   Weight: 139 lb (63.05 kg)   SpO2: 100% 100%   Body mass index is 21.77 kg/(m^2).   General: A&O x 3, WDWN, elderly and cachectic  Head: [place]/AT, mild temporalis wasting  Ear/Nose/Throat: Hearing grossly intact, nares w/o erythema or drainage, oropharynx w/o Erythema/Exudate  Eyes: PERRLA, EOMI  Neck: Supple, no nuchal rigidity, no palpable LAD, L CEA incision  Pulmonary: Sym exp, good air movt, CTAB, no rales, rhonchi, & wheezing  Cardiac: RRR, Nl S1, S2, no Murmurs, rubs or gallops  Vascular: Vessel Right Left  Radial Palpable Palpable  Brachial Palpable Palpable  Carotid Palpable, with bruit Palpable, without bruit  Aorta Non-palpable N/A  Femoral Palpable Palpable  Popliteal Non-palpable Non-palpable  PT Palpable Non-Palpable  DP Palpable  Non-Palpable   Gastrointestinal: soft, NTND, -G/R, - HSM, - masses, - CVAT B, lower midline   incision  Musculoskeletal: M/S 5/5 throughout , Extremities without ischemic changes, cyanotic feet, some CVI skin changes  Neurologic: CN 2-12 intact , Pain and light touch intact in extremities , Motor exam as listed above  Psychiatric: Judgment intact, Mood & affect appropriatefor pt's clinical situation  Dermatologic: See M/S exam for extremity exam, no rashes otherwise noted  Lymph : No Cervical, Axillary, or Inguinal lymphadenopathy   Outside Studies/Documentation 10 pages of outside documents were reviewed including: BLE PPG which suggest L leg inflow disease.  Medical Decision Making  Karl Stevenson is a 78 y.o. 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 Eventually 20% may require intervention in a year.  I discussed in depth with the patient the nature of atherosclerosis, and emphasized the importance of maximal medical management including strict control of blood pressure, blood glucose, and lipid levels, obtaining regular exercise, and cessation of smoking.  The patient is aware that without maximal medical management the underlying atherosclerotic disease process will progress, limiting the benefit of any interventions.  I discussed in depth with the patient a walking plan and how to execute such.  Based on his examination, I recommend Aortogram, Left leg runoff, possible iliac intervention which is schedule for the 24th of January  Additionally, follow B carotid duplex is scheduled with one month follow up given prior L CEA and R carotid bruit on exam  Thank you for allowing us to participate in this patient's care.  Dorise Gangi, MD [facility] [place] Office: [phone] Pager: [phone]  06/05/2011, 9:50 AM    

## 2011-07-01 NOTE — Anesthesia Procedure Notes (Signed)
Procedure Name: Intubation Date/Time: 07/01/2011 8:44 AM Performed by: Julianne Rice K Pre-anesthesia Checklist: Patient identified, Timeout performed, Emergency Drugs available, Suction available and Patient being monitored Patient Re-evaluated:Patient Re-evaluated prior to inductionOxygen Delivery Method: Circle System Utilized Preoxygenation: Pre-oxygenation with 100% oxygen Intubation Type: IV induction Ventilation: Mask ventilation without difficulty Laryngoscope Size: Mac and 3 Grade View: Grade II Tube type: Oral Tube size: 7.5 mm Number of attempts: 1 Airway Equipment and Method: stylet Placement Confirmation: positive ETCO2,  ETT inserted through vocal cords under direct vision and breath sounds checked- equal and bilateral Secured at: 23 cm Tube secured with: Tape Dental Injury: Teeth and Oropharynx as per pre-operative assessment

## 2011-07-01 NOTE — Anesthesia Postprocedure Evaluation (Signed)
  Anesthesia Post-op Note  Patient: Karl Stevenson  Procedure(s) Performed: Procedure(s) (LRB): ENDARTERECTOMY FEMORAL (Left)  Patient Location: PACU  Anesthesia Type: General  Level of Consciousness: awake, alert  and oriented  Airway and Oxygen Therapy: Patient Spontanous Breathing and Patient connected to nasal cannula oxygen  Post-op Pain: mild  Post-op Assessment: Post-op Vital signs reviewed, Patient's Cardiovascular Status Stable, Respiratory Function Stable, Patent Airway, No signs of Nausea or vomiting and Pain level controlled  Post-op Vital Signs: Reviewed and stable  Complications: No apparent anesthesia complications

## 2011-07-01 NOTE — Transfer of Care (Signed)
Immediate Anesthesia Transfer of Care Note  Patient: Karl Stevenson  Procedure(s) Performed: Procedure(s) (LRB): ENDARTERECTOMY FEMORAL (Left)  Patient Location: PACU  Anesthesia Type: General  Level of Consciousness: awake, alert  and oriented  Airway & Oxygen Therapy: Patient Spontanous Breathing and Patient connected to nasal cannula oxygen  Post-op Assessment: Report given to PACU RN and Post -op Vital signs reviewed and stable  Post vital signs: Reviewed and stable  Complications: No apparent anesthesia complications

## 2011-07-01 NOTE — Interval H&P Note (Signed)
--    [facility] [place]  History and Physical Update  The patient was interviewed and re-examined.  The patient's History and Physical has been reviewed and is unchanged except for interval diagnostic angiogram which demonstrates severe  Left common femoral artery stenosis with moderate to severe stenosis of Right common femoral artery.  Based on preoperative Cardiology evaluation, the patient is at moderate to high risk for cardiac events, so only the left femoral endarterectomy will be done today.  There is no change in the plan of care.  Leonides Sake, MD [facility] [place] Office: [phone] Pager: [phone]  07/01/2011, 7:54 AM

## 2011-07-01 NOTE — Progress Notes (Signed)
78 yo M s/p left iliofemoral endarterectomy.  Pharmacy asked to adjust antibiotics based on renal function.  Patient to receive Zinacef post-op, estimated CrCl ~38 ml/min. Plan:  Continue Zinacef 1.5 gm IV q 12hr x 2 doses Pharmacy will sign off, please reconsult with any further medication issues  Rolland Porter, Pharm.D., BCPS Clinical Pharmacist Pager: [phone]

## 2011-07-01 NOTE — Op Note (Signed)
OPERATIVE NOTE   PROCEDURE: Left iliofemoral endarterectomy with bovine patch angioplasty  PRE-OPERATIVE DIAGNOSIS: Near occluded left common femoral artery stenosis and severe short distance claudication  POST-OPERATIVE DIAGNOSIS: same as above   SURGEON: Leonides Sake, MD  ASSISTANT(S): Lianne Cure, PAC  ANESTHESIA: general  ESTIMATED BLOOD LOSS: 200 cc  FINDING(S): 1. Extensive calcific atherosclerosis through common femoral artery  Left common femoral artery nearly occluded with thrombus Near occluded proximal common femoral artery/distal external iliac artery stenosis Dopplerable anterior tibial artery and posterior tibial artery at end of case  SPECIMEN(S):  Iliofemoral plaque  INDICATIONS:   Karl Stevenson is a 78 y.o. male who presents with severe short distance claudication and evidence of nearly occluded common femoral artery on the left side with severe stenosis in the common femoral artery on the right.  The patient's cardiologist felt he was at moderate to high risk for cardiac complications, so I recommend we repair his common femoral arteries one at a time, starting with the left one.  The risk, benefits, and alternative for an endarterectomy were discussed with the patient.  The patient is aware the risks include but are not limited to: bleeding, infection, myocardial infarction, stroke, death,  limb loss, nerve damage, need for additional procedures in the future, wound complications, and inability to complete the endarterectomy.  The patient is aware of these risks and agreed to proceed. . DESCRIPTION: After obtain full informed written consent, the patient was brought back to the operating room and placed supine upon the operating table.  The patient received IV antibiotics prior to induction.  After obtaining adequate anesthesia, the patient was prepped and draped in the standard fashion for: a left iliofemoral endarterectomy.  I made an incision over the common  femoral artery and dissected down to the artery with electrocautery.  This artery was dissected out from the femoral bifurcation and up to the distal external iliac artery.  Vessel loops were applied to all major branches and circumflex branches.  I extended the proximal dissected into the distal retroperitoneum.  I was able to find a soft segment of the artery here which could be clamped.  After giving the patient 5000 units of Heparin intravenously, which was a therapeutic bolus, I waited three minutes.  Note, later in the case I gave an additional 2000 units of Heparin in one hour.  I clamped the external iliac artery, superficial femoral artery, and profunda femoral artery.  All circumflex branches were placed under tension with vessel loops.  I made an arteriotomy in the common femoral artery with a 11-blade and extended it proximally and distally with a Pott's scissor.  Despite the calcification, I was able to cut through the calcification.  There was essentially occlusion of the lumen with old chronic thrombus.  I evacuated this thrombus and then was able to identify a plane to start the endarterectomy.  I extended the arteriotomy proximally into the external iliac artery.  I carried this dissection in the external iliac artery.  The plaque extracted from the external iliac artery smoothly with minimal residual intimal flaps.  There were extracted bluntly as proximally as easily extracted.  There remained a densely adherent plaque on the posterior wall that would not extract easily.  This plaque consisted of less than 10% of the luminal cross section so I did not feel it was worth risking injury to the external iliac artery to remove this plaque.  I carried the plaque down into the femoral bifurcation where I extracted  the calcified plaque from the proximal superficial femoral artery and profunda femoral artery.   This maneuver required extending the arteriotomy into the superficial femoral artery.  In  re-evaluating the artery, there was extensively diseased intimal present, which I was able to find another plane just deep to this layer.  I completed the endarterectomy in this layer and was able to remove the remaining thrombogenic appearing intima.  The residual arterial wall was of adequate strength without a significant thrombogenic surface.  I finished extracting plaque from the superficial femoral artery and profunda femoral artery.  Both lumens were widely patent, but there were some loose intima at the orifice of both arteries.  I placed tacking stitches 7-0 Prolene circumferentially to secure the intima in place in both arteries.  I tested the intima in both arteries with heparinized saline, the intima did not lift in either artery.  I then obtained a bovine pericardial patch and fashioned it for the geometry of this artery.  This patch was sewn into placed with two running stitches of 6-0 Prolene and tying in the middle.  Prior to completing this patch angioplasty, I backbled the superficial femoral artery, profunda femoral artery, and external iliac artery.  There was clot obtained.  I finished the patch angioplasty and released all clamps and vessel loops.  I also remove a few clips placed on small side branches.  Thrombin and gelfoam was liberally applied to the artery.  I gave the patient 30 mg of Protamine to reverse anticoagulation.  There was a limited amount bleeding after a few minutes.  I washed out the wound and placed a single repair stitch into the patched artery.  I placed some thrombin and gelfoam.  After waiting a few minutes, no further bleeding was seen.  I washed out the wound one last time.  The groin was repair with a double layer of 2-0 Vicryl and a double layer of 3-0 Vicryl.  The skin was reapproximated with a running stitch of 4-0 Monocryl.  The skin was then cleaned, dried, and Dermabond applied to reinforce the skin closure.  A Coverall dressing was then applied to the left  groin.  I then broke scrub and checked the left foot: a posterior tibial artery and anterior tibial artery were dopplerable.  COMPLICATIONS: none  CONDITION: stable  Leonides Sake, MD [facility] [place] Office: [phone] Pager: [phone]  07/01/2011, 12:06 PM

## 2011-07-01 NOTE — Preoperative (Signed)
Beta Blockers   Reason not to administer Beta Blockers:Not Applicable 

## 2011-07-01 NOTE — Anesthesia Preprocedure Evaluation (Addendum)
Anesthesia Evaluation  Patient identified by MRN, date of birth, ID band Patient awake    Reviewed: Allergy & Precautions, H&P , NPO status , Patient's Chart, lab work & pertinent test results  Airway Mallampati: I TM Distance: >3 FB Neck ROM: Full    Dental  (+) Teeth Intact and Dental Advisory Given   Pulmonary  clear to auscultation        Cardiovascular hypertension, Pt. on medications + CAD, + CABG and + Peripheral Vascular Disease Regular Normal    Neuro/Psych    GI/Hepatic   Endo/Other    Renal/GU      Musculoskeletal   Abdominal   Peds  Hematology   Anesthesia Other Findings   Reproductive/Obstetrics                         Anesthesia Physical Anesthesia Plan  ASA: III  Anesthesia Plan: General   Post-op Pain Management:    Induction: Intravenous  Airway Management Planned: Oral ETT  Additional Equipment:   Intra-op Plan:   Post-operative Plan: Extubation in OR  Informed Consent: I have reviewed the patients History and Physical, chart, labs and discussed the procedure including the risks, benefits and alternatives for the proposed anesthesia with the patient or authorized representative who has indicated his/her understanding and acceptance.   Dental advisory given  Plan Discussed with: Anesthesiologist, Surgeon and CRNA  Anesthesia Plan Comments:        Anesthesia Quick Evaluation

## 2011-07-02 LAB — BASIC METABOLIC PANEL
Chloride: 109 mEq/L (ref 96–112)
Creatinine, Ser: 1.23 mg/dL (ref 0.50–1.35)
GFR calc Af Amer: 61 mL/min — ABNORMAL LOW (ref >90)
Potassium: 4.1 mEq/L (ref 3.5–5.1)

## 2011-07-02 LAB — CBC
Platelets: 117 10*3/uL — ABNORMAL LOW (ref 150–400)
RDW: 12.9 % (ref 11.5–15.5)
WBC: 8.9 10*3/uL (ref 4.0–10.5)

## 2011-07-02 MED ORDER — OXYCODONE HCL 5 MG PO TABS: 5.0000 mg | ORAL_TABLET | Freq: Four times a day (QID) | ORAL | Status: DC | PRN

## 2011-07-02 NOTE — Progress Notes (Addendum)
[facility] [place]  Progress Note Bypass Surgery  Date of Surgery: 07/01/2011  Procedure(s): ENDARTERECTOMY FEMORAL Surgeon: Surgeon(s): Nilda Simmer, MD  1 Day Post-Op  History of Present Illness  Karl Stevenson is a 78 y.o. male who is S/P Procedure(s): ENDARTERECTOMY FEMORAL left.  The patient's pre-op symptoms of Left thigh pain are Improved . Patients pain is well controlled.      Imaging: No results found.  Significant Diagnostic Studies: CBC Lab Results  Component Value Date   WBC 8.9 07/02/2011   HGB 11.0* 07/02/2011   HCT 32.5* 07/02/2011   MCV 98.5 07/02/2011   PLT 117* 07/02/2011    BMET @LASTCHEMISTRY @     Component Value Date/Time   NA 142 07/02/2011 0515   K 4.1 07/02/2011 0515   CL 109 07/02/2011 0515   CO2 26 07/02/2011 0515   GLUCOSE 104* 07/02/2011 0515   BUN 17 07/02/2011 0515   CREATININE 1.23 07/02/2011 0515   CALCIUM 8.7 07/02/2011 0515   GFRNONAA 53* 07/02/2011 0515   GFRAA 61* 07/02/2011 0515    COAG Lab Results  Component Value Date   INR 1.03 06/26/2011   INR 1.03 11/08/2010   INR 1.0 11/16/2008   No results found for this basename: PTT    Physical Examination  BP Readings from Last 3 Encounters:  07/02/11 100/67  07/02/11 100/67  06/26/11 128/74   Temp Readings from Last 3 Encounters:  07/02/11 98.6 F (37 C) Oral  07/02/11 98.6 F (37 C) Oral  06/26/11 97 F (36.1 C) Oral   SpO2 Readings from Last 3 Encounters:  07/02/11 96%  07/02/11 96%  06/26/11 95%   Pulse Readings from Last 3 Encounters:  07/02/11 100  07/02/11 100  06/26/11 74    Pt is A&O x 3 left lower extremity: Incision/s is/are clean, dry, intact or clean,dry.intact, and  clean, dry, intact or healing without hematoma, erythema or drainage Limb is warm; with good color  Left Dorsalis Pedis pulse is present and palpable LeftPosterior tibial pulse is  present and palpable    Assessment: Pt. Doing well Post-op pain is  controlled Wounds are clean, dry, intact or healing well Transfer to 2000. Pos. D/C tomorrow.  Plan: PT/OT for ambulation Continue wound care as ordered  Clinton Gallant [facility] [phone] 07/02/2011 7:32 AM  Addendum  I have independently interviewed and examined the patient, and I agree with the physician assistant's findings.  Faintly palpable pulses, verified with doppler.  Left groin inc c/d/i w/o hematoma.  Pt amb. Ok.  Pain controlled. ABI today  Leonides Sake, MD [facility] [place] Office: [phone] Pager: [phone]  07/02/2011, 7:39 AM

## 2011-07-02 NOTE — Discharge Summary (Signed)
[facility] Discharge Summary   Patient ID:  Karl Stevenson MRN: [id number] DOB/AGE: May 30, 1926 78 y.o.  Admit date: 07/01/2011 Discharge date: 07/02/2011 Date of Surgery: 07/01/2011 Surgeon: Surgeon(s): Nilda Simmer, MD  Admission Diagnosis: Peripheral Arterial Disease  Discharge Diagnoses:  Peripheral Arterial Disease  Secondary Diagnoses: Past Medical History  Diagnosis Date  . Coronary artery disease     CABG 1987, redo 2008; Last LHC 1/10: SVG-OM 1/OM 2 patent, SVG-LAD patent, SVG-OM 3 Patent  . Hyperlipidemia   . Hypertension   . Carotid artery occlusion     s/p L CEA in 2010  . Crohn's disease     colon resection  . Anemia   . S/P CABG (coronary artery bypass graft)     1987 and 2008  . Bradycardia     Intolerant to beta blockers  . Gouty arthritis   . Renal artery stenosis     noted in 2010 per cath. 70-80% Left RAS; last duplex in August 2012 with 1-59% on the right, and no blockage on the left  . Chronic systolic heart failure     echo 7/12: EF 35-40%, anteroseptal hypokinesis, mild MR.  . Ischemic cardiomyopathy     EF 34% Karl Stevenson 06/2011), 35-40% (echo 11/2010)    Procedure(s): ENDARTERECTOMY FEMORAL  Discharged Condition: good  HPI:  NYQUAN Stevenson is a 78 y.o. male who presents with chief complaint: left thigh pain. Onset of symptom occurred 1 month ago. Pain is described as aching and occasionally sharp, severity 4-6/10, and associated with short distance ambulation. Patient has attempted to treat this pain with rest. The patient has never rest pain symptoms also and never had leg wounds/ulcers. Atherosclerotic risk factors include: hyperlipidemia and HTN.   Hospital Course:  Karl Stevenson is a 78 y.o. male is S/P Left Procedure(s): ENDARTERECTOMY FEMORAL Extubated: POD # 0 Post-op wounds clean, dry, intact or healing well Pt. Ambulating, voiding and taking PO diet without difficulty. Pt pain controlled with PO pain  meds. Labs as below Complications:none  Consults:     Significant Diagnostic Studies: CBC Lab Results  Component Value Date   WBC 8.9 07/02/2011   HGB 11.0* 07/02/2011   HCT 32.5* 07/02/2011   MCV 98.5 07/02/2011   PLT 117* 07/02/2011    BMET    Component Value Date/Time   NA 142 07/02/2011 0515   K 4.1 07/02/2011 0515   CL 109 07/02/2011 0515   CO2 26 07/02/2011 0515   GLUCOSE 104* 07/02/2011 0515   BUN 17 07/02/2011 0515   CREATININE 1.23 07/02/2011 0515   CALCIUM 8.7 07/02/2011 0515   GFRNONAA 53* 07/02/2011 0515   GFRAA 61* 07/02/2011 0515   COAG Lab Results  Component Value Date   INR 1.03 06/26/2011   INR 1.03 11/08/2010   INR 1.0 11/16/2008     Disposition:  Discharge to :Home Discharge Orders    Future Appointments: Provider: Department: Dept Phone: Center:   08/21/2011 12:00 PM Micheline Chapman, MD [facility] [phone] [facility]      [place], [place]  Home Medication Instructions [id number]   Printed on:07/02/11 0716  Medication Information                    aspirin 81 MG tablet Take 81 mg by mouth daily.            ipratropium (ATROVENT) 0.03 % nasal spray Place 2 sprays into the nose daily.  atorvastatin (LIPITOR) 10 MG tablet Take 10 mg by mouth daily.             fexofenadine (ALLEGRA) 180 MG tablet Take 180 mg by mouth daily as needed. allergies           nitroGLYCERIN (NITROSTAT) 0.4 MG SL tablet Place 0.4 mg under the tongue every 5 (five) minutes as needed. For chest pain           amLODipine (NORVASC) 5 MG tablet Take 1 tablet (5 mg total) by mouth daily.           valsartan (DIOVAN) 160 MG tablet Take 160 mg by mouth daily.            isosorbide mononitrate (IMDUR) 60 MG 24 hr tablet Take 60 mg by mouth daily.           Multiple Vitamin (MULITIVITAMIN WITH MINERALS) TABS Take 1 tablet by mouth daily.           pantoprazole (PROTONIX) 40 MG tablet Take 40 mg by mouth daily.           clopidogrel (PLAVIX) 75  MG tablet Take 75 mg by mouth daily.           ranolazine (RANEXA) 500 MG 12 hr tablet Take 500 mg by mouth 2 (two) times daily.           COLCRYS 0.6 MG tablet            ibuprofen (ADVIL,MOTRIN) 600 MG tablet            hydrochlorothiazide (MICROZIDE) 12.5 MG capsule            NIASPAN 500 MG CR tablet            PE: right DP/PT pulses accessed via doppler.  Skin warm and cap refill brisk.  Full active range of motion intact of left LE.  Femoral incision C/D/I. Verbal and written Discharge instructions given to the patient. Wound care per Discharge AVS  F/U in 2 weeks with Dr. Imogene Burn. SignedMosetta Pigeon 07/02/2011, 7:16 AM    Discharge today 06-02-2011  Addendum  I have independently interviewed and examined the patient, and I agree with the physician assistant's discharge summary.  [name] had an uneventful left iliofemoral endarterectomy and upon discharge was ambulating appropriately with good pain control and tolerating his diet.Karl Sake, MD [facility] [place] Office: [phone] Pager: [phone]  07/07/2011, 7:25 AM

## 2011-07-02 NOTE — Progress Notes (Signed)
Report called to 2000, all meds up to date, family at bedside, VSS.

## 2011-07-03 ENCOUNTER — Encounter (HOSPITAL_COMMUNITY): Payer: Self-pay

## 2011-07-03 MED ORDER — OXYCODONE HCL 5 MG PO TABS: 5.0000 mg | ORAL_TABLET | Freq: Four times a day (QID) | ORAL | Status: AC | PRN

## 2011-07-03 NOTE — Progress Notes (Signed)
Utilization review completed. Enyah Moman, RN, BSN. 07/03/11 

## 2011-07-07 ENCOUNTER — Ambulatory Visit: Payer: Medicare FFS | Admitting: Cardiovascular Disease

## 2011-07-10 ENCOUNTER — Ambulatory Visit: Payer: Medicare FFS | Admitting: Vascular Surgery

## 2011-07-10 ENCOUNTER — Other Ambulatory Visit: Payer: Medicare FFS

## 2011-07-23 ENCOUNTER — Encounter: Payer: Self-pay | Admitting: Vascular Surgery

## 2011-07-24 ENCOUNTER — Encounter: Payer: Self-pay | Admitting: Vascular Surgery

## 2011-07-24 ENCOUNTER — Ambulatory Visit (INDEPENDENT_AMBULATORY_CARE_PROVIDER_SITE_OTHER): Payer: Medicare FFS | Admitting: Vascular Surgery

## 2011-07-24 ENCOUNTER — Other Ambulatory Visit: Payer: Self-pay

## 2011-07-24 VITALS — BP 115/81 | HR 94 | Resp 16 | Ht 67.0 in | Wt 135.0 lb

## 2011-07-24 DIAGNOSIS — I739 Peripheral vascular disease, unspecified: Secondary | ICD-10-CM | POA: Insufficient documentation

## 2011-07-24 DIAGNOSIS — N186 End stage renal disease: Secondary | ICD-10-CM | POA: Insufficient documentation

## 2011-07-24 NOTE — Progress Notes (Signed)
[facility] [place]  Postoperative Visit  History of Present Illness  Karl Stevenson is a 78 y.o. year old male who presents for postoperative follow-up for: L iliofemoral EA with BPA  (Date: 07/01/11).  The patient's wounds are healing.  The patient notes resolution of lower extremity symptoms.  The patient is  able to complete their activities of daily living.  The patient's current symptoms are: none.  Physical Examination  Filed Vitals:   07/24/11 0928  BP: 115/81  Pulse: 94  Resp: 16   LLE: Incisions are healing, small superficial fluid collection palpable, pedal pulses are palpable  Medical Decision Making  Karl Stevenson is a 78 y.o. year old male who presents s/p L iliofemoral endarterectomy with bovine patch angioplasty.  The patient's bypass incisions are healing appropriately with resolution of pre-operative symptoms.  Now that the left femoral artery stenosis has been addressed, we're going to schedule the right iliofemoral endarterectomy for 1 month on 17 APR 13.  This is in order to allow the pt to heal up the left groin. I discussed in depth with the patient the nature of atherosclerosis, and emphasized the importance of maximal medical management including strict control of blood pressure, blood glucose, and lipid levels, obtaining regular exercise, and cessation of smoking.  The patient is aware that without maximal medical management the underlying atherosclerotic disease process will progress, limiting the benefit of any interventions.  Thank you for allowing [place] to participate in this patient's care.  Leonides Sake, MD [facility] [place] Office: [phone] Pager: [phone]

## 2011-08-11 ENCOUNTER — Other Ambulatory Visit (HOSPITAL_COMMUNITY): Payer: Self-pay | Admitting: *Deleted

## 2011-08-11 ENCOUNTER — Encounter (HOSPITAL_COMMUNITY): Payer: Self-pay | Admitting: Pharmacy Technician

## 2011-08-12 ENCOUNTER — Encounter (HOSPITAL_COMMUNITY): Payer: Self-pay

## 2011-08-12 ENCOUNTER — Encounter (HOSPITAL_COMMUNITY)
Admission: RE | Admit: 2011-08-12 | Discharge: 2011-08-12 | Disposition: A | Discharge status: Home / Self Care | Payer: Medicare FFS | Source: Ambulatory Visit | Attending: Vascular Surgery | Admitting: Vascular Surgery

## 2011-08-12 HISTORY — DX: Peripheral vascular disease, unspecified: I73.9

## 2011-08-12 LAB — URINALYSIS, ROUTINE W REFLEX MICROSCOPIC
Bilirubin Urine: NEGATIVE
Glucose, UA: NEGATIVE mg/dL
Ketones, ur: NEGATIVE mg/dL
Leukocytes, UA: NEGATIVE
Protein, ur: NEGATIVE mg/dL

## 2011-08-12 LAB — TYPE AND SCREEN: ABO/RH(D): O NEG

## 2011-08-12 LAB — CBC
HCT: 36.8 % — ABNORMAL LOW (ref 39.0–52.0)
MCH: 33.1 pg (ref 26.0–34.0)
MCHC: 34.2 g/dL (ref 30.0–36.0)
MCV: 96.6 fL (ref 78.0–100.0)
RDW: 12.9 % (ref 11.5–15.5)

## 2011-08-12 LAB — COMPREHENSIVE METABOLIC PANEL
Albumin: 4 g/dL (ref 3.5–5.2)
BUN: 23 mg/dL (ref 6–23)
Creatinine, Ser: 1.34 mg/dL (ref 0.50–1.35)
GFR calc Af Amer: 55 mL/min — ABNORMAL LOW (ref >90)
Total Protein: 6.8 g/dL (ref 6.0–8.3)

## 2011-08-12 LAB — DIFFERENTIAL
Basophils Relative: 0 % (ref 0–1)
Lymphs Abs: 1.7 10*3/uL (ref 0.7–4.0)
Monocytes Relative: 9 % (ref 3–12)
Neutro Abs: 4.6 10*3/uL (ref 1.7–7.7)
Neutrophils Relative %: 66 % (ref 43–77)

## 2011-08-12 LAB — PROTIME-INR
INR: 1.03 (ref 0.00–1.49)
Prothrombin Time: 13.7 seconds (ref 11.6–15.2)

## 2011-08-12 NOTE — Progress Notes (Signed)
PATIENT STATED HE WAS INSTRUCTED BY DR Imogene Burn TO CONTINUE ASPIRIN AND PLAVIX UP UNTIL SURGERY DATE.

## 2011-08-12 NOTE — Pre-Procedure Instructions (Signed)
20 Karl Stevenson  08/12/2011   Your procedure is scheduled on:  Wednesday  08/26/11  Report to [facility] at 630 AM.  Call this number if you have problems the morning of surgery: [phone]   Remember:   Do not eat food:After Midnight.  May have clear liquids: up to 4 Hours before arrival.  Clear liquids include soda, tea, black coffee, apple or grape juice, broth.  Take these medicines the morning of surgery with A SIP OF WATER: NORVASC(AMLODIPINE), COLCRYS, NASAL SPRAY, IMDUR, OYYCODONE, PROTONIX, RANEXA,      Do not wear jewelry, make-up or nail polish.  Do not wear lotions, powders, or perfumes. You may wear deodorant.  Do not shave 48 hours prior to surgery.  Do not bring valuables to the hospital.  Contacts, dentures or bridgework may not be worn into surgery.  Leave suitcase in the car. After surgery it may be brought to your room.  For patients admitted to the hospital, checkout time is 11:00 AM the day of discharge.   Patients discharged the day of surgery will not be allowed to drive home.  Name and phone number of your driver:   Special Instructions: CHG Shower Use Special Wash: 1/2 bottle night before surgery and 1/2 bottle morning of surgery.   Please read over the following fact sheets that you were given: Pain Booklet, MRSA Information and Surgical Site Infection Prevention

## 2011-08-13 ENCOUNTER — Other Ambulatory Visit: Payer: Self-pay | Admitting: *Deleted

## 2011-08-13 DIAGNOSIS — I10 Essential (primary) hypertension: Secondary | ICD-10-CM

## 2011-08-13 MED ORDER — AMLODIPINE BESYLATE 5 MG PO TABS: 5.0000 mg | ORAL_TABLET | Freq: Every day | ORAL | Status: DC

## 2011-08-21 ENCOUNTER — Ambulatory Visit (INDEPENDENT_AMBULATORY_CARE_PROVIDER_SITE_OTHER): Payer: Medicare FFS | Admitting: Cardiovascular Disease

## 2011-08-21 ENCOUNTER — Encounter: Payer: Self-pay | Admitting: Cardiovascular Disease

## 2011-08-21 VITALS — BP 118/70 | HR 80 | Ht 67.0 in | Wt 133.0 lb

## 2011-08-21 DIAGNOSIS — I10 Essential (primary) hypertension: Secondary | ICD-10-CM

## 2011-08-21 DIAGNOSIS — I2510 Atherosclerotic heart disease of native coronary artery without angina pectoris: Secondary | ICD-10-CM

## 2011-08-21 NOTE — Patient Instructions (Signed)
Please increase your fluid intake.  Your physician wants you to follow-up in: 6 MONTHS.  You will receive a reminder letter in the mail two months in advance. If you don't receive a letter, please call our office to schedule the follow-up appointment.

## 2011-08-25 MED ORDER — DEXTROSE 5 % IV SOLN
1.5000 g | Freq: Once | INTRAVENOUS | Status: AC
  Administered 2011-08-26: 1.5 g via INTRAVENOUS
  Filled 2011-08-25: qty 1.5

## 2011-08-26 ENCOUNTER — Ambulatory Visit (HOSPITAL_COMMUNITY): Payer: Medicare FFS | Admitting: Anesthesiology

## 2011-08-26 ENCOUNTER — Encounter (HOSPITAL_COMMUNITY): Payer: Self-pay | Admitting: Anesthesiology

## 2011-08-26 ENCOUNTER — Other Ambulatory Visit: Payer: Self-pay

## 2011-08-26 ENCOUNTER — Encounter: Payer: Self-pay | Admitting: Cardiovascular Disease

## 2011-08-26 ENCOUNTER — Encounter (HOSPITAL_COMMUNITY)
Admission: RE | Disposition: A | Discharge status: Home / Self Care | Payer: Self-pay | Source: Ambulatory Visit | Attending: Vascular Surgery

## 2011-08-26 ENCOUNTER — Inpatient Hospital Stay (HOSPITAL_COMMUNITY)
Admission: RE | Admit: 2011-08-26 | Discharge: 2011-08-28 | DRG: 238 | Disposition: A | Discharge status: Home / Self Care | Payer: Medicare FFS | Source: Ambulatory Visit | Attending: Vascular Surgery | Admitting: Vascular Surgery

## 2011-08-26 ENCOUNTER — Ambulatory Visit (HOSPITAL_COMMUNITY): Payer: Medicare FFS

## 2011-08-26 ENCOUNTER — Encounter (HOSPITAL_COMMUNITY): Payer: Self-pay | Admitting: *Deleted

## 2011-08-26 DIAGNOSIS — Z951 Presence of aortocoronary bypass graft: Secondary | ICD-10-CM | POA: Insufficient documentation

## 2011-08-26 DIAGNOSIS — I498 Other specified cardiac arrhythmias: Secondary | ICD-10-CM | POA: Diagnosis present

## 2011-08-26 DIAGNOSIS — I2589 Other forms of chronic ischemic heart disease: Secondary | ICD-10-CM | POA: Diagnosis present

## 2011-08-26 DIAGNOSIS — I5022 Chronic systolic (congestive) heart failure: Secondary | ICD-10-CM | POA: Diagnosis present

## 2011-08-26 DIAGNOSIS — M109 Gout, unspecified: Secondary | ICD-10-CM | POA: Diagnosis present

## 2011-08-26 DIAGNOSIS — E785 Hyperlipidemia, unspecified: Secondary | ICD-10-CM | POA: Diagnosis present

## 2011-08-26 DIAGNOSIS — I959 Hypotension, unspecified: Secondary | ICD-10-CM

## 2011-08-26 DIAGNOSIS — I701 Atherosclerosis of renal artery: Secondary | ICD-10-CM | POA: Diagnosis present

## 2011-08-26 DIAGNOSIS — D649 Anemia, unspecified: Secondary | ICD-10-CM | POA: Insufficient documentation

## 2011-08-26 DIAGNOSIS — I70219 Atherosclerosis of native arteries of extremities with intermittent claudication, unspecified extremity: Principal | ICD-10-CM | POA: Diagnosis present

## 2011-08-26 DIAGNOSIS — I2510 Atherosclerotic heart disease of native coronary artery without angina pectoris: Secondary | ICD-10-CM | POA: Diagnosis present

## 2011-08-26 DIAGNOSIS — I10 Essential (primary) hypertension: Secondary | ICD-10-CM

## 2011-08-26 DIAGNOSIS — I9589 Other hypotension: Secondary | ICD-10-CM | POA: Diagnosis not present

## 2011-08-26 DIAGNOSIS — I743 Embolism and thrombosis of arteries of the lower extremities: Secondary | ICD-10-CM

## 2011-08-26 DIAGNOSIS — I739 Peripheral vascular disease, unspecified: Secondary | ICD-10-CM

## 2011-08-26 DIAGNOSIS — I252 Old myocardial infarction: Secondary | ICD-10-CM

## 2011-08-26 HISTORY — DX: Abdominal aortic aneurysm, without rupture, unspecified: I71.40

## 2011-08-26 HISTORY — PX: ENDARTERECTOMY: SHX5162

## 2011-08-26 HISTORY — DX: Abdominal aortic aneurysm, without rupture: I71.4

## 2011-08-26 SURGERY — ENDARTERECTOMY, ILIAC
Anesthesia: General | Site: Leg Upper | Laterality: Right | Wound class: Clean

## 2011-08-26 MED ORDER — THROMBIN 20000 UNITS EX KIT
PACK | CUTANEOUS | Status: DC | PRN
  Administered 2011-08-26 (×2): via TOPICAL

## 2011-08-26 MED ORDER — PROTAMINE SULFATE 10 MG/ML IV SOLN
INTRAVENOUS | Status: DC | PRN
  Administered 2011-08-26: 30 mg via INTRAVENOUS

## 2011-08-26 MED ORDER — CLOPIDOGREL BISULFATE 75 MG PO TABS
75.0000 mg | ORAL_TABLET | Freq: Every day | ORAL | Status: DC
  Administered 2011-08-27 – 2011-08-28 (×2): 75 mg via ORAL
  Filled 2011-08-26 (×2): qty 1

## 2011-08-26 MED ORDER — HETASTARCH-ELECTROLYTES 6 % IV SOLN
INTRAVENOUS | Status: DC | PRN
  Administered 2011-08-26: 09:00:00 via INTRAVENOUS

## 2011-08-26 MED ORDER — ATORVASTATIN CALCIUM 10 MG PO TABS
10.0000 mg | ORAL_TABLET | Freq: Every day | ORAL | Status: DC
  Administered 2011-08-26 – 2011-08-27 (×2): 10 mg via ORAL
  Filled 2011-08-26 (×3): qty 1

## 2011-08-26 MED ORDER — TEMAZEPAM 15 MG PO CAPS: 15.0000 mg | ORAL_CAPSULE | Freq: Every evening | ORAL | Status: DC | PRN

## 2011-08-26 MED ORDER — RANOLAZINE ER 500 MG PO TB12
500.0000 mg | ORAL_TABLET | Freq: Two times a day (BID) | ORAL | Status: DC
Start: 2011-08-26 — End: 2011-08-28
  Administered 2011-08-26 – 2011-08-28 (×4): 500 mg via ORAL
  Filled 2011-08-26 (×6): qty 1

## 2011-08-26 MED ORDER — METOPROLOL TARTRATE 1 MG/ML IV SOLN: 2.0000 mg | INTRAVENOUS | Status: DC | PRN

## 2011-08-26 MED ORDER — HYDRALAZINE HCL 20 MG/ML IJ SOLN
10.0000 mg | INTRAMUSCULAR | Status: DC | PRN
  Filled 2011-08-26: qty 0.5

## 2011-08-26 MED ORDER — ISOSORBIDE MONONITRATE ER 60 MG PO TB24
60.0000 mg | ORAL_TABLET | Freq: Every day | ORAL | Status: DC
  Administered 2011-08-27 – 2011-08-28 (×2): 60 mg via ORAL
  Filled 2011-08-26 (×3): qty 1

## 2011-08-26 MED ORDER — ALUM & MAG HYDROXIDE-SIMETH 200-200-20 MG/5ML PO SUSP: 15.0000 mL | ORAL | Status: DC | PRN

## 2011-08-26 MED ORDER — SODIUM CHLORIDE 0.9 % IV SOLN: INTRAVENOUS | Status: DC

## 2011-08-26 MED ORDER — CEFUROXIME SODIUM 1.5 G IJ SOLR
1.5000 g | Freq: Two times a day (BID) | INTRAMUSCULAR | Status: AC
  Administered 2011-08-26 – 2011-08-27 (×2): 1.5 g via INTRAVENOUS
  Filled 2011-08-26 (×2): qty 1.5

## 2011-08-26 MED ORDER — PHENYLEPHRINE HCL 10 MG/ML IJ SOLN
INTRAMUSCULAR | Status: DC | PRN
  Administered 2011-08-26: 80 ug via INTRAVENOUS
  Administered 2011-08-26: 40 ug via INTRAVENOUS

## 2011-08-26 MED ORDER — DOCUSATE SODIUM 100 MG PO CAPS
100.0000 mg | ORAL_CAPSULE | Freq: Every day | ORAL | Status: DC
  Administered 2011-08-27: 100 mg via ORAL
  Filled 2011-08-26 (×2): qty 1

## 2011-08-26 MED ORDER — ASPIRIN 81 MG PO TABS: 81.0000 mg | ORAL_TABLET | Freq: Every day | ORAL | Status: DC

## 2011-08-26 MED ORDER — POTASSIUM CHLORIDE CRYS ER 20 MEQ PO TBCR: 20.0000 meq | EXTENDED_RELEASE_TABLET | Freq: Once | ORAL | Status: AC | PRN

## 2011-08-26 MED ORDER — PHENOL 1.4 % MT LIQD: 1.0000 | OROMUCOSAL | Status: DC | PRN

## 2011-08-26 MED ORDER — IPRATROPIUM BROMIDE 0.03 % NA SOLN
2.0000 | Freq: Every day | NASAL | Status: DC
  Filled 2011-08-26: qty 30

## 2011-08-26 MED ORDER — NEOSTIGMINE METHYLSULFATE 1 MG/ML IJ SOLN
INTRAMUSCULAR | Status: DC | PRN
  Administered 2011-08-26: 2.5 mg via INTRAVENOUS

## 2011-08-26 MED ORDER — LACTATED RINGERS IV SOLN: INTRAVENOUS | Status: DC

## 2011-08-26 MED ORDER — ROCURONIUM BROMIDE 100 MG/10ML IV SOLN
INTRAVENOUS | Status: DC | PRN
  Administered 2011-08-26: 50 mg via INTRAVENOUS

## 2011-08-26 MED ORDER — ONDANSETRON HCL 4 MG/2ML IJ SOLN: 4.0000 mg | Freq: Four times a day (QID) | INTRAMUSCULAR | Status: DC | PRN

## 2011-08-26 MED ORDER — 0.9 % SODIUM CHLORIDE (POUR BTL) OPTIME
TOPICAL | Status: DC | PRN
  Administered 2011-08-26: 1000 mL

## 2011-08-26 MED ORDER — GLYCOPYRROLATE 0.2 MG/ML IJ SOLN
INTRAMUSCULAR | Status: DC | PRN
  Administered 2011-08-26: .5 mg via INTRAVENOUS

## 2011-08-26 MED ORDER — SODIUM CHLORIDE 0.9 % IR SOLN
Status: DC | PRN
  Administered 2011-08-26: 10:00:00

## 2011-08-26 MED ORDER — ADULT MULTIVITAMIN W/MINERALS CH
1.0000 | ORAL_TABLET | Freq: Every day | ORAL | Status: DC
  Administered 2011-08-26 – 2011-08-28 (×3): 1 via ORAL
  Filled 2011-08-26 (×3): qty 1

## 2011-08-26 MED ORDER — ACETAMINOPHEN 325 MG PO TABS: 325.0000 mg | ORAL_TABLET | ORAL | Status: DC | PRN

## 2011-08-26 MED ORDER — ACETAMINOPHEN 650 MG RE SUPP: 325.0000 mg | RECTAL | Status: DC | PRN

## 2011-08-26 MED ORDER — ONDANSETRON HCL 4 MG/2ML IJ SOLN
INTRAMUSCULAR | Status: DC | PRN
  Administered 2011-08-26: 4 mg via INTRAVENOUS

## 2011-08-26 MED ORDER — IRBESARTAN 75 MG PO TABS
75.0000 mg | ORAL_TABLET | Freq: Every day | ORAL | Status: DC
  Administered 2011-08-27 – 2011-08-28 (×2): 75 mg via ORAL
  Filled 2011-08-26 (×3): qty 1

## 2011-08-26 MED ORDER — ATORVASTATIN CALCIUM 10 MG PO TABS
10.0000 mg | ORAL_TABLET | Freq: Every day | ORAL | Status: DC
  Filled 2011-08-26: qty 1

## 2011-08-26 MED ORDER — SODIUM CHLORIDE 0.9 % IV SOLN: 500.0000 mL | Freq: Once | INTRAVENOUS | Status: AC | PRN

## 2011-08-26 MED ORDER — PNEUMOCOCCAL VAC POLYVALENT 25 MCG/0.5ML IJ INJ
0.5000 mL | INJECTION | INTRAMUSCULAR | Status: AC
  Administered 2011-08-27: 0.5 mL via INTRAMUSCULAR
  Filled 2011-08-26: qty 0.5

## 2011-08-26 MED ORDER — PANTOPRAZOLE SODIUM 40 MG PO TBEC
40.0000 mg | DELAYED_RELEASE_TABLET | Freq: Every day | ORAL | Status: DC
  Administered 2011-08-26 – 2011-08-28 (×3): 40 mg via ORAL
  Filled 2011-08-26 (×3): qty 1

## 2011-08-26 MED ORDER — HEMOSTATIC AGENTS (NO CHARGE) OPTIME
TOPICAL | Status: DC | PRN
  Administered 2011-08-26: 1 via TOPICAL

## 2011-08-26 MED ORDER — MORPHINE SULFATE 2 MG/ML IJ SOLN: 2.0000 mg | INTRAMUSCULAR | Status: DC | PRN

## 2011-08-26 MED ORDER — LACTATED RINGERS IV SOLN
INTRAVENOUS | Status: DC | PRN
  Administered 2011-08-26 (×3): via INTRAVENOUS

## 2011-08-26 MED ORDER — GUAIFENESIN-DM 100-10 MG/5ML PO SYRP: 15.0000 mL | ORAL_SOLUTION | ORAL | Status: DC | PRN

## 2011-08-26 MED ORDER — ASPIRIN 81 MG PO CHEW
81.0000 mg | CHEWABLE_TABLET | Freq: Every day | ORAL | Status: DC
  Administered 2011-08-27 – 2011-08-28 (×2): 81 mg via ORAL
  Filled 2011-08-26 (×2): qty 1

## 2011-08-26 MED ORDER — LORATADINE 10 MG PO TABS
10.0000 mg | ORAL_TABLET | Freq: Every day | ORAL | Status: DC
  Administered 2011-08-26 – 2011-08-28 (×2): 10 mg via ORAL
  Filled 2011-08-26 (×3): qty 1

## 2011-08-26 MED ORDER — PANTOPRAZOLE SODIUM 40 MG PO TBEC: 40.0000 mg | DELAYED_RELEASE_TABLET | Freq: Every day | ORAL | Status: DC

## 2011-08-26 MED ORDER — HYDROMORPHONE HCL PF 1 MG/ML IJ SOLN: 0.2500 mg | INTRAMUSCULAR | Status: DC | PRN

## 2011-08-26 MED ORDER — EPHEDRINE SULFATE 50 MG/ML IJ SOLN
INTRAMUSCULAR | Status: DC | PRN
  Administered 2011-08-26: 10 mg via INTRAVENOUS
  Administered 2011-08-26 (×2): 15 mg via INTRAVENOUS

## 2011-08-26 MED ORDER — PROPOFOL 10 MG/ML IV EMUL
INTRAVENOUS | Status: DC | PRN
  Administered 2011-08-26: 150 mg via INTRAVENOUS

## 2011-08-26 MED ORDER — IOHEXOL 300 MG/ML  SOLN
INTRAMUSCULAR | Status: DC | PRN
  Administered 2011-08-26: 50 mL via INTRA_ARTERIAL
  Administered 2011-08-26: 20 mL via INTRA_ARTERIAL

## 2011-08-26 MED ORDER — THROMBIN 20000 UNITS EX KIT: PACK | OROMUCOSAL | Status: DC | PRN

## 2011-08-26 MED ORDER — DOPAMINE-DEXTROSE 3.2-5 MG/ML-% IV SOLN: 3.0000 ug/kg/min | INTRAVENOUS | Status: DC

## 2011-08-26 MED ORDER — NITROGLYCERIN 0.4 MG SL SUBL: 0.4000 mg | SUBLINGUAL_TABLET | SUBLINGUAL | Status: DC | PRN

## 2011-08-26 MED ORDER — KCL IN DEXTROSE-NACL 20-5-0.45 MEQ/L-%-% IV SOLN
INTRAVENOUS | Status: DC
  Administered 2011-08-26 – 2011-08-27 (×2): via INTRAVENOUS
  Filled 2011-08-26 (×4): qty 1000

## 2011-08-26 MED ORDER — HEPARIN SODIUM (PORCINE) 1000 UNIT/ML IJ SOLN
INTRAMUSCULAR | Status: DC | PRN
  Administered 2011-08-26: 5000 [IU] via INTRAVENOUS
  Administered 2011-08-26: 2000 [IU] via INTRAVENOUS

## 2011-08-26 MED ORDER — FENTANYL CITRATE 0.05 MG/ML IJ SOLN
INTRAMUSCULAR | Status: DC | PRN
  Administered 2011-08-26 (×4): 50 ug via INTRAVENOUS

## 2011-08-26 MED ORDER — AMLODIPINE BESYLATE 5 MG PO TABS
5.0000 mg | ORAL_TABLET | Freq: Every day | ORAL | Status: DC
  Administered 2011-08-27: 5 mg via ORAL
  Filled 2011-08-26 (×2): qty 1

## 2011-08-26 MED ORDER — COLCHICINE 0.6 MG PO TABS
0.6000 mg | ORAL_TABLET | Freq: Every day | ORAL | Status: DC
  Administered 2011-08-26 – 2011-08-27 (×2): 0.6 mg via ORAL
  Filled 2011-08-26 (×2): qty 1

## 2011-08-26 MED ORDER — OXYCODONE HCL 5 MG PO TABS: 5.0000 mg | ORAL_TABLET | ORAL | Status: DC | PRN

## 2011-08-26 MED ORDER — LABETALOL HCL 5 MG/ML IV SOLN: 10.0000 mg | INTRAVENOUS | Status: DC | PRN

## 2011-08-26 SURGICAL SUPPLY — 54 items
ADH SKN CLS APL DERMABOND .7 (GAUZE/BANDAGES/DRESSINGS) ×1
BANDAGE ELASTIC 4 VELCRO ST LF (GAUZE/BANDAGES/DRESSINGS) IMPLANT
CANISTER SUCTION 2500CC (MISCELLANEOUS) ×2 IMPLANT
CLIP TI MEDIUM 24 (CLIP) ×2 IMPLANT
CLIP TI WIDE RED SMALL 24 (CLIP) ×2 IMPLANT
CLOTH BEACON ORANGE TIMEOUT ST (SAFETY) ×2 IMPLANT
COVER PROBE W GEL 5X96 (DRAPES) ×1 IMPLANT
COVER SURGICAL LIGHT HANDLE (MISCELLANEOUS) ×4 IMPLANT
DERMABOND ADVANCED (GAUZE/BANDAGES/DRESSINGS) ×1
DERMABOND ADVANCED .7 DNX12 (GAUZE/BANDAGES/DRESSINGS) IMPLANT
DRAIN CHANNEL 15F RND FF W/TCR (WOUND CARE) IMPLANT
DRAPE C-ARM 42X72 X-RAY (DRAPES) ×1 IMPLANT
DRAPE WARM FLUID 44X44 (DRAPE) ×2 IMPLANT
DRSG COVADERM 4X8 (GAUZE/BANDAGES/DRESSINGS) IMPLANT
ELECT REM PT RETURN 9FT ADLT (ELECTROSURGICAL) ×2
ELECTRODE REM PT RTRN 9FT ADLT (ELECTROSURGICAL) ×1 IMPLANT
EVACUATOR SILICONE 100CC (DRAIN) IMPLANT
GAUZE SPONGE 4X4 16PLY XRAY LF (GAUZE/BANDAGES/DRESSINGS) ×2 IMPLANT
GLOVE BIO SURGEON STRL SZ7 (GLOVE) ×4 IMPLANT
GLOVE BIOGEL M 6.5 STRL (GLOVE) ×5 IMPLANT
GLOVE BIOGEL PI IND STRL 6.5 (GLOVE) IMPLANT
GLOVE BIOGEL PI IND STRL 7.5 (GLOVE) ×1 IMPLANT
GLOVE BIOGEL PI INDICATOR 6.5 (GLOVE) ×4
GLOVE BIOGEL PI INDICATOR 7.5 (GLOVE) ×1
GLOVE ECLIPSE 6.5 STRL STRAW (GLOVE) ×2 IMPLANT
GOWN PREVENTION PLUS XLARGE (GOWN DISPOSABLE) ×2 IMPLANT
GOWN STRL NON-REIN LRG LVL3 (GOWN DISPOSABLE) ×6 IMPLANT
KIT BASIN OR (CUSTOM PROCEDURE TRAY) ×2 IMPLANT
KIT ROOM TURNOVER OR (KITS) ×2 IMPLANT
NS IRRIG 1000ML POUR BTL (IV SOLUTION) ×4 IMPLANT
PACK PERIPHERAL VASCULAR (CUSTOM PROCEDURE TRAY) ×2 IMPLANT
PAD ARMBOARD 7.5X6 YLW CONV (MISCELLANEOUS) ×4 IMPLANT
PATCH VASCULAR VASCU GUARD 1X6 (Vascular Products) ×1 IMPLANT
SET COLLECT BLD 21X3/4 12 PB (MISCELLANEOUS) ×1 IMPLANT
SET MICROPUNCTURE 5F STIFF (MISCELLANEOUS) ×1 IMPLANT
SPONGE INTESTINAL PEANUT (DISPOSABLE) ×1 IMPLANT
SPONGE SURGIFOAM ABS GEL 100 (HEMOSTASIS) ×2 IMPLANT
STAPLER VISISTAT 35W (STAPLE) IMPLANT
STOPCOCK MORSE 400PSI 3WAY (MISCELLANEOUS) ×1 IMPLANT
SURGIFLO W/THROMBIN 8M KIT (HEMOSTASIS) ×1 IMPLANT
SUT MNCRL AB 4-0 PS2 18 (SUTURE) ×2 IMPLANT
SUT PROLENE 5 0 C 1 24 (SUTURE) ×1 IMPLANT
SUT PROLENE 6 0 BV (SUTURE) ×9 IMPLANT
SUT PROLENE 7 0 BV1 MDA (SUTURE) ×2 IMPLANT
SUT VIC AB 2-0 CT1 27 (SUTURE) ×2
SUT VIC AB 2-0 CT1 TAPERPNT 27 (SUTURE) ×1 IMPLANT
SUT VIC AB 3-0 SH 27 (SUTURE) ×2
SUT VIC AB 3-0 SH 27X BRD (SUTURE) ×1 IMPLANT
TOWEL OR 17X24 6PK STRL BLUE (TOWEL DISPOSABLE) ×4 IMPLANT
TOWEL OR 17X26 10 PK STRL BLUE (TOWEL DISPOSABLE) ×2 IMPLANT
TRAY FOLEY CATH 14FRSI W/METER (CATHETERS) ×2 IMPLANT
TUBING EXTENTION W/L.L. (IV SETS) ×1 IMPLANT
UNDERPAD 30X30 INCONTINENT (UNDERPADS AND DIAPERS) ×2 IMPLANT
WATER STERILE IRR 1000ML POUR (IV SOLUTION) ×2 IMPLANT

## 2011-08-26 NOTE — Op Note (Addendum)
OPERATIVE NOTE   PROCEDURE:  1.  Right iliofemoral endarterectomy with bovine patch angioplasty  2.  Intraoperative right leg angiogram  PRE-OPERATIVE DIAGNOSIS: Severe right common femoral artery stenosis and severe short distance claudication   POST-OPERATIVE DIAGNOSIS: same as above   SURGEON: Leonides Sake, MD   ASSISTANT(S): Newton Pigg, PAC   ANESTHESIA: general   ESTIMATED BLOOD LOSS: 300 cc   FINDING(S):  1. Extensive calcific atherosclerosis through common femoral artery  2. Left common femoral artery nearly occluded with thrombus 3. 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  4. Patent right external iliac artery, common femoral artery, superficial femoral artery and profunda femoral artery on right leg angiogram 5. Dopplerable right dorsalis pedis and posterior tibial arteries                              SPECIMEN(S): Iliofemoral plaque   INDICATIONS:  Karl Stevenson is a 78 y.o. male who presents with severe short distance claudication and evidence of nearly occluded common femoral artery on the left side with severe stenosis in the common femoral artery on the right. The patient has already undergone left iliofemoral endarterectomy with bovine patch angioplasty.  The patient's cardiologist felt he was at moderate to high risk for cardiac complications, so we repaired his right common femoral artery first and now will be working on the right one. The risk, benefits, and alternative for an endarterectomy were discussed with the patient. The patient is aware the risks include but are not limited to: bleeding, infection, myocardial infarction, stroke, death, limb loss, nerve damage, need for additional procedures in the future, wound complications, and inability to complete the endarterectomy. The patient is aware of these risks and agreed to proceed.    .  DESCRIPTION:  After obtain full informed written consent, the patient was brought back to the operating room and placed supine upon the operating table. The patient received IV antibiotics prior to induction. After obtaining adequate anesthesia, the patient was prepped and draped in the standard fashion for: a right iliofemoral endarterectomy. I made an incision over the common femoral artery and dissected down to the artery with electrocautery. This artery was dissected out from the femoral bifurcation up to the distal external iliac artery. Vessel loops were applied to all major branches and circumflex branches. I extended the proximal dissection into the distal retroperitoneum. I was able to find a soft segment of the artery here which could be clamped. After giving the patient 5000 units of Heparin intravenously, which was a therapeutic bolus, I waited three minutes. Note, later in the case I gave an additional 2000 units of Heparin in one hour. I clamped the external iliac artery, superficial femoral artery, and profunda femoral artery. All circumflex branches were placed under tension with vessel loops. I made an arteriotomy in the common femoral artery with a 11-blade and extended it proximally and distally with a Pott's scissor. Despite the calcification, I was able to cut through the calcification. There was essentially an occlusion of the lumen with old chronic thrombus. I evacuated this thrombus and then was able to identify a plane to start the endarterectomy. I extended the arteriotomy proximally into the external iliac artery. I carried this dissection in the external iliac artery. The plaque extracted from the external iliac artery smoothly with minimal residual intimal flaps.  The intimal flamps were extracted bluntly as proximally as easily extracted. I carried the plaque down into the femoral bifurcation where I extracted the calcified plaque from the proximal superficial femoral artery and  profunda femoral artery. Note, there was no evidence of a severe atherosclerotic plaque.  This maneuver required extending the arteriotomy into the superficial femoral artery. In re-evaluating the common femoral artery, there was extensively diseased intimal present, which I was able to find another plane just deep to the diseased layer.  I completed the endarterectomy in this deeper layer and was able to remove the remaining thrombogenic appearing intima. The residual arterial wall was of adequate strength without a significant thrombogenic surface. I finished extracting plaque from the superficial femoral artery and profunda femoral artery.  Both lumens were patent, but there was a sizeable anterior plaque in the superficial femoral artery, so I extended the arteriotomy past this area of anterior plaque.  I also extended the endarterectomy just distal to the anterior plaque.  I sharply transected the plaque just distal to this plaque.   I placed tacking stitches with 7-0 Prolene circumferentially in the right superficial femoral artery to secure the intima in place.  I tested the intima in both arteries with heparinized saline, the intima did not lift in either artery. I then obtained a bovine pericardial patch and fashioned it for the geometry of this artery. This patch was sewn into place with two running stitches of 6-0 Prolene and tying in the middle. Prior to completing this patch angioplasty, I backbled the superficial femoral artery, profunda femoral artery, and external iliac artery. There was no clot obtained. I finished the patch angioplasty and released all clamps and vessel loops.  Thrombin and gelfoam was liberally applied to the artery. I gave the patient 50 mg of Protamine to reverse anticoagulation. I had to place multiple repair stitches to repair bleeding points in this patched up common femoral artery.  I placed some additional thrombin and gelfoam.   At this point, I cannulated the patch with a  micropuncture needle.  I passed the microwire into the right external iliac artery.  The needle was exchanged for a microsheath.  The sheath was connected to IV extension tubing.  I did hand injections under fluoroscopy to image the proximal vessels.  The right external iliac artery, common femoral artery, profunda femoral artery, and superficial femoral artery appeared widely patent.  I repaired the cannulation site with a 6-0 Prolene and pulled the sheath while tying the stitch.  There continued to be some raw surface bleeding in the right groin, so I applied Surgiflo to control this residual bleeding.  The groin was repair with a double layer of 2-0 Vicryl and a double layer of 3-0 Vicryl. The skin was reapproximated with a running stitch of 4-0 Monocryl. The skin was then cleaned, dried, and Dermabond applied to reinforce the skin closure.   The right foot had dopplerable dorsalis pedis and posterior tibial arteries.  COMPLICATIONS: none   CONDITION: stable  Leonides Sake, MD [facility] [place] Office: [phone] Pager: [phone]  08/26/2011, 12:51 PM

## 2011-08-26 NOTE — Anesthesia Procedure Notes (Signed)
Procedure Name: Intubation Date/Time: 08/26/2011 8:45 AM Performed by: Mancel Parsons Pre-anesthesia Checklist: Patient identified, Timeout performed, Emergency Drugs available, Suction available and Patient being monitored Patient Re-evaluated:Patient Re-evaluated prior to inductionOxygen Delivery Method: Circle system utilized Preoxygenation: Pre-oxygenation with 100% oxygen Intubation Type: IV induction Ventilation: Mask ventilation without difficulty Laryngoscope Size: Mac and 3 Grade View: Grade II Tube type: Oral Tube size: 7.5 mm Number of attempts: 1 Airway Equipment and Method: Stylet Placement Confirmation: ETT inserted through vocal cords under direct vision,  positive ETCO2 and breath sounds checked- equal and bilateral Secured at: 22 cm Tube secured with: Tape Dental Injury: Teeth and Oropharynx as per pre-operative assessment

## 2011-08-26 NOTE — Transfer of Care (Signed)
Immediate Anesthesia Transfer of Care Note  Patient: Karl Stevenson  Procedure(s) Performed: Procedure(s) (LRB): ENDARTERECTOMY ILIAC (Right)  Patient Location: PACU  Anesthesia Type: General  Level of Consciousness: sedated  Airway & Oxygen Therapy: Patient Spontanous Breathing and Patient connected to nasal cannula oxygen  Post-op Assessment: Report given to PACU RN and Post -op Vital signs reviewed and stable  Post vital signs: Reviewed  Complications: No apparent anesthesia complications

## 2011-08-26 NOTE — Assessment & Plan Note (Signed)
Well controlled on multidrug therapy.

## 2011-08-26 NOTE — H&P (Signed)
[facility] [place]  Brief Access History and Physical  History of Present Illness  Karl Stevenson is a 78 y.o. male who presents with chief complaint: R CFA stenosis.  The patient presents today for R iliofemoral endarterectomy and bovine patch angioplasty.  This patient had bilateral severe L CFA stenoses.  He has already undergone his L iliofemoral endarterectomy with bovine patch angioplasty.  I elected to stage his repairs due to his poor cardiac status.    Past Medical History  Diagnosis Date  . Hyperlipidemia   . Hypertension   . Carotid artery occlusion     s/p L CEA in 2010  . Crohn's disease     colon resection  . S/P CABG (coronary artery bypass graft)     1987 and 2008  . Bradycardia     Intolerant to beta blockers  . Chronic systolic heart failure     echo 7/12: EF 35-40%, anteroseptal hypokinesis, mild MR.  . Ischemic cardiomyopathy     EF 34% (Lexiscan 06/2011), 35-40% (echo 11/2010)  . Coronary artery disease     CABG 1987, redo 2008; Last LHC 1/10: SVG-OM 1/OM 2 patent, SVG-LAD patent, SVG-OM 3 Patent  . Myocardial infarction     PATIENT SEES DR Excell Seltzer   . Peripheral vascular disease   . CHF (congestive heart failure)   . Renal artery stenosis     noted in 2010 per cath. 70-80% Left RAS; last duplex in August 2012 with 1-59% on the right, and no blockage on the left  . Gouty arthritis     Past Surgical History  Procedure Date  . Carotid endarterectomy 2010    left  . Colon resection   . Cardiac catheterization 2010    Manage medically   . Tonsillectomy     as a child  . Pr vein bypass graft,aorto-fem-pop 10/1985  . Coronary artery bypass graft 10/31/85 & 08/19/06  . Vein bypass surgery     LEFT   06/2011    History   Social History  . Marital Status: Married    Spouse Name: N/A    Number of Children: N/A  . Years of Education: N/A   Occupational History  . Not on file.   Social History Main Topics  . Smoking status: Never  Smoker   . Smokeless tobacco: Not on file  . Alcohol Use: No  . Drug Use: No  . Sexually Active: Not on file   Other Topics Concern  . Not on file   Social History Narrative  . No narrative on file    Family History  Problem Relation Age of Onset  . Heart disease Father   . Cancer Mother   . Anesthesia problems Neg Hx     No current facility-administered medications on file prior to encounter.   Current Outpatient Prescriptions on File Prior to Encounter  Medication Sig Dispense Refill  . amLODipine (NORVASC) 5 MG tablet Take 1 tablet (5 mg total) by mouth daily.  30 tablet  6  . aspirin 81 MG tablet Take 81 mg by mouth daily.       [name] atorvastatin (LIPITOR) 10 MG tablet Take 10 mg by mouth daily.        . clopidogrel (PLAVIX) 75 MG tablet Take 75 mg by mouth daily.      [name] COLCRYS 0.6 MG tablet Take 0.6 mg by mouth daily.       . fexofenadine (ALLEGRA) 180 MG tablet Take 180  mg by mouth daily as needed. allergies      . ipratropium (ATROVENT) 0.03 % nasal spray Place 2 sprays into the nose daily.       . isosorbide mononitrate (IMDUR) 60 MG 24 hr tablet Take 60 mg by mouth daily.      . Multiple Vitamin (MULITIVITAMIN WITH MINERALS) TABS Take 1 tablet by mouth daily.      [name] oxyCODONE (OXY IR/ROXICODONE) 5 MG immediate release tablet Take 5 mg by mouth every 4 (four) hours as needed. For pain      . pantoprazole (PROTONIX) 40 MG tablet Take 40 mg by mouth daily.      . ranolazine (RANEXA) 500 MG 12 hr tablet Take 500 mg by mouth 2 (two) times daily.      . valsartan (DIOVAN) 160 MG tablet Take 160 mg by mouth daily.       . nitroGLYCERIN (NITROSTAT) 0.4 MG SL tablet Place 0.4 mg under the tongue every 5 (five) minutes as needed. For chest pain        Allergies  Allergen Reactions  . Novocain Other (See Comments)    Cold sweats and headache.     Review of Systems: Kidney Disease, As listed above, otherwise negative.  Physical Examination  Filed Vitals:   08/26/11 0627    BP: 127/74  Pulse: 80  Temp: 97.1 F (36.2 C)  TempSrc: Oral  Resp: 18  SpO2: 100%   There is no height or weight on file to calculate BMI.  General: A&O x 3, WDWN, elderly  Pulmonary: Sym exp, good air movt, CTAB, no rales, rhonchi, & wheezing  Cardiac: RRR, Nl S1, S2, no Murmurs, rubs or gallops  Gastrointestinal: soft, NTND, -G/R, - HSM, - masses, - CVAT B  Musculoskeletal: M/S 5/5 throughout , Extremities without ischemic changes , L groin incision well healed  Laboratory See iStat  Medical Decision Making  Karl Stevenson is a 78 y.o. male who presents with: R CFA stenosis.   The patient is scheduled for: R iliofemoral endarterectomy and bovine patch angioplasty.  The risk, benefits, and alternative for an endarterectomy operation were discussed with the patient.  The patient is aware the risks include but are not limited to: bleeding, infection, myocardial infarction, stroke, limb loss, nerve damage, need for additional procedures in the future, wound complications, and inability to complete the endarterectomy.  The patient is aware of these risks and agreed to proceed.  The patient is aware of the risks and agrees to proceed.  Leonides Sake, MD [facility] [place] Office: [phone] Pager: [phone]  08/26/2011, 7:51 AM

## 2011-08-26 NOTE — Progress Notes (Signed)
Pt got out of bed to bedside commode at approximately 2200 and did well. Upon returning to bed his heart rate went into the 130's to 140's. Once he was settled his heart rate was still in the 110's to 120's. A 12 lead EKG was obtained and did not appear to be any different than previous findings. Pt heart rate currently 110 and he is resting. Will continue to monitor.

## 2011-08-26 NOTE — Anesthesia Preprocedure Evaluation (Signed)
Anesthesia Evaluation  Patient identified by MRN, date of birth, ID band Patient awake    Reviewed: Allergy & Precautions, H&P , NPO status , Patient's Chart, lab work & pertinent test results, reviewed documented beta blocker date and time   Airway Mallampati: I      Dental  (+) Teeth Intact and Dental Advisory Given   Pulmonary          Cardiovascular hypertension, Pt. on medications + CAD, + Past MI and +CHF     Neuro/Psych    GI/Hepatic   Endo/Other    Renal/GU      Musculoskeletal   Abdominal   Peds  Hematology   Anesthesia Other Findings   Reproductive/Obstetrics                           Anesthesia Physical Anesthesia Plan  ASA: III  Anesthesia Plan: General   Post-op Pain Management:    Induction: Intravenous  Airway Management Planned: Oral ETT  Additional Equipment:   Intra-op Plan:   Post-operative Plan: Extubation in OR  Informed Consent: I have reviewed the patients History and Physical, chart, labs and discussed the procedure including the risks, benefits and alternatives for the proposed anesthesia with the patient or authorized representative who has indicated his/her understanding and acceptance.   Dental advisory given  Plan Discussed with: Anesthesiologist  Anesthesia Plan Comments:         Anesthesia Quick Evaluation

## 2011-08-26 NOTE — Progress Notes (Signed)
ANTIBIOTIC CONSULT NOTE - INITIAL  Pharmacy Consult for pharmacy may adjust antibiotics for renal function Indication: post-op prophylaxis  Allergies  Allergen Reactions  . Novocain Other (See Comments)    Cold sweats and headache.     Patient Measurements: Weight: 133 lb (60.328 kg) Adjusted Body Weight: 60.3 kg  Vital Signs: Temp: 98.1 F (36.7 C) (04/17 1345) Temp src: Oral (04/17 0627) BP: 106/56 mmHg (04/17 1345) Pulse Rate: 82  (04/17 1315) Intake/Output from previous day:   Intake/Output from this shift: Total I/O In: 3200 [I.V.:2700; IV Piggyback:500] Out: 395 [Urine:295; Blood:100]  Labs: No results found for this basename: WBC:3,HGB:3,PLT:3,LABCREA:3,CREATININE:3 in the last 72 hours The CrCl is unknown because both a height and weight (above a minimum accepted value) are required for this calculation. No results found for this basename: VANCOTROUGH:2,VANCOPEAK:2,VANCORANDOM:2,GENTTROUGH:2,GENTPEAK:2,GENTRANDOM:2,TOBRATROUGH:2,TOBRAPEAK:2,TOBRARND:2,AMIKACINPEAK:2,AMIKACINTROU:2,AMIKACIN:2, in the last 72 hours   Microbiology: Recent Results (from the past 720 hour(s))  SURGICAL PCR SCREEN     Status: Normal   Collection Time   08/12/11 11:25 AM      Component Value Range Status Comment   MRSA, PCR NEGATIVE  NEGATIVE  Final    Staphylococcus aureus NEGATIVE  NEGATIVE  Final     Medical History: Past Medical History  Diagnosis Date  . Hyperlipidemia   . Hypertension   . Carotid artery occlusion     s/p L CEA in 2010  . Crohn's disease     colon resection  . S/P CABG (coronary artery bypass graft)     1987 and 2008  . Bradycardia     Intolerant to beta blockers  . Chronic systolic heart failure     echo 7/12: EF 35-40%, anteroseptal hypokinesis, mild MR.  . Ischemic cardiomyopathy     EF 34% (Lexiscan 06/2011), 35-40% (echo 11/2010)  . Coronary artery disease     CABG 1987, redo 2008; Last LHC 1/10: SVG-OM 1/OM 2 patent, SVG-LAD patent, SVG-OM 3 Patent   . Myocardial infarction     PATIENT SEES DR Excell Seltzer   . Peripheral vascular disease   . CHF (congestive heart failure)   . Renal artery stenosis     noted in 2010 per cath. 70-80% Left RAS; last duplex in August 2012 with 1-59% on the right, and no blockage on the left  . Gouty arthritis     Medications:  Scheduled:    . amLODipine  5 mg Oral Daily  . aspirin  81 mg Oral Daily  . atorvastatin  10 mg Oral Daily  . cefUROXime (ZINACEF)  IV  1.5 g Intravenous Once  . cefUROXime (ZINACEF)  IV  1.5 g Intravenous Q12H  . clopidogrel  75 mg Oral Daily  . colchicine  0.6 mg Oral Daily  . docusate sodium  100 mg Oral Daily  . ipratropium  2 spray Nasal Daily  . irbesartan  75 mg Oral Daily  . isosorbide mononitrate  60 mg Oral Daily  . loratadine  10 mg Oral Daily  . mulitivitamin with minerals  1 tablet Oral Daily  . pantoprazole  40 mg Oral Q1200  . ranolazine  500 mg Oral BID  . DISCONTD: aspirin  81 mg Oral Daily  . DISCONTD: pantoprazole  40 mg Oral Daily   Assessment: 78 yo male on Zinacef x 2 doses post-op.  Pharmacy asked to monitor doses for renal function.  Dosing is appropriate, no changes needed.  Plan:  Continue post-op doses as scheduled.  Pharmacy will sign off.  Thanks!  Bessie Boyte, Gwenlyn Found  08/26/2011,2:46 PM

## 2011-08-26 NOTE — Progress Notes (Signed)
HPI:  78 year old gentleman presenting for followup evaluation. The patient has coronary artery disease and has undergone 2 previous bypass surgeries, first in 1987 and redo surgery in 2008. His last cardiac catheterization in 2000 and demonstrated patency of the vein graft sequence to the obtuse marginal branches of the circumflex, vein graft to LAD, and vein graft to OM 3. He had mild to moderate left subclavian stenosis. His left ventricular ejection fraction was estimated at 40%. He has a small infrarenal abdominal aortic aneurysm. The patient has extensive peripheral arterial disease and has undergone carotid endarterectomy as well as lower extremity revascularization surgeries by Dr. Imogene Burn. He recently had a pharmacologic stress Myoview scan in February 2013 demonstrating a fixed defect in the anterior wall consistent with previous infarction. There is no ischemia. The patient is doing well from a cardiac standpoint. He denies chest pain or shortness of breath. He does admit to some lightheaded spells. These generally occur with positional changes. He has not had frank syncope. He is scheduled for another vascular surgery next week.  Outpatient Encounter Prescriptions as of 08/21/2011  Medication Sig Dispense Refill  . amLODipine (NORVASC) 5 MG tablet Take 1 tablet (5 mg total) by mouth daily.  30 tablet  6  . aspirin 81 MG tablet Take 81 mg by mouth daily.       [name] atorvastatin (LIPITOR) 10 MG tablet Take 10 mg by mouth daily.        . clopidogrel (PLAVIX) 75 MG tablet Take 75 mg by mouth daily.      [name] COLCRYS 0.6 MG tablet Take 0.6 mg by mouth daily.       . fexofenadine (ALLEGRA) 180 MG tablet Take 180 mg by mouth daily as needed. allergies      . ipratropium (ATROVENT) 0.03 % nasal spray Place 2 sprays into the nose daily.       . isosorbide mononitrate (IMDUR) 60 MG 24 hr tablet Take 60 mg by mouth daily.      . Multiple Vitamin (MULITIVITAMIN WITH MINERALS) TABS Take 1 tablet by mouth daily.       . nitroGLYCERIN (NITROSTAT) 0.4 MG SL tablet Place 0.4 mg under the tongue every 5 (five) minutes as needed. For chest pain      . oxyCODONE (OXY IR/ROXICODONE) 5 MG immediate release tablet Take 5 mg by mouth every 4 (four) hours as needed. For pain      . pantoprazole (PROTONIX) 40 MG tablet Take 40 mg by mouth daily.      . ranolazine (RANEXA) 500 MG 12 hr tablet Take 500 mg by mouth 2 (two) times daily.      . valsartan (DIOVAN) 160 MG tablet Take 160 mg by mouth daily.        No facility-administered encounter medications on file as of 08/21/2011.    Allergies  Allergen Reactions  . Novocain Other (See Comments)    Cold sweats and headache.     Past Medical History  Diagnosis Date  . Hyperlipidemia   . Hypertension   . Carotid artery occlusion     s/p L CEA in 2010  . Crohn's disease     colon resection  . S/P CABG (coronary artery bypass graft)     1987 and 2008  . Bradycardia     Intolerant to beta blockers  . Chronic systolic heart failure     echo 7/12: EF 35-40%, anteroseptal hypokinesis, mild MR.  . Ischemic cardiomyopathy     EF  34% Eugenie Birks 06/2011), 35-40% (echo 11/2010)  . Coronary artery disease     CABG 1987, redo 2008; Last LHC 1/10: SVG-OM 1/OM 2 patent, SVG-LAD patent, SVG-OM 3 Patent  . Myocardial infarction     PATIENT SEES DR Excell Seltzer   . Peripheral vascular disease   . CHF (congestive heart failure)   . Renal artery stenosis     noted in 2010 per cath. 70-80% Left RAS; last duplex in August 2012 with 1-59% on the right, and no blockage on the left  . Gouty arthritis     ROS: Negative except as per HPI  BP 118/70  Pulse 80  Ht 5\' 7"  (1.702 m)  Wt 60.328 kg (133 lb)  BMI 20.83 kg/m2  PHYSICAL EXAM: Pt is alert and oriented, NAD HEENT: normal Neck: JVP - normal, carotids 2+= without bruits Lungs: CTA bilaterally CV: RRR without murmur or gallop Abd: soft, NT, Positive BS, no hepatomegaly Ext: no C/C/E Skin: warm/dry no  rash  ASSESSMENT AND PLAN:

## 2011-08-26 NOTE — Anesthesia Postprocedure Evaluation (Signed)
  Anesthesia Post-op Note  Patient: Karl Stevenson  Procedure(s) Performed: Procedure(s) (LRB): ENDARTERECTOMY ILIAC (Right)  Patient Location: PACU  Anesthesia Type: General  Level of Consciousness: awake  Airway and Oxygen Therapy: Patient Spontanous Breathing  Post-op Pain: mild  Post-op Assessment: Post-op Vital signs reviewed  Post-op Vital Signs: Reviewed  Complications: No apparent anesthesia complications

## 2011-08-26 NOTE — Assessment & Plan Note (Signed)
Overall stable. He will continue his current medical program. I recommended that he increase his fluid intake considering his positional lightheadedness. He is on aggressive medical program which includes a statin drug, aspirin, Plavix, long-acting nitrate, ARB, and Ranexa.

## 2011-08-26 NOTE — Preoperative (Signed)
Beta Blockers   Reason not to administer Beta Blockers:Not Applicable. No home beta blockers 

## 2011-08-27 ENCOUNTER — Other Ambulatory Visit: Payer: Self-pay

## 2011-08-27 ENCOUNTER — Encounter (HOSPITAL_COMMUNITY): Payer: Self-pay | Admitting: Physician Assistant

## 2011-08-27 DIAGNOSIS — I739 Peripheral vascular disease, unspecified: Secondary | ICD-10-CM

## 2011-08-27 DIAGNOSIS — I10 Essential (primary) hypertension: Secondary | ICD-10-CM

## 2011-08-27 DIAGNOSIS — I959 Hypotension, unspecified: Secondary | ICD-10-CM

## 2011-08-27 DIAGNOSIS — I2510 Atherosclerotic heart disease of native coronary artery without angina pectoris: Secondary | ICD-10-CM

## 2011-08-27 LAB — CBC
Platelets: 99 10*3/uL — ABNORMAL LOW (ref 150–400)
RDW: 13.2 % (ref 11.5–15.5)
WBC: 6.8 10*3/uL (ref 4.0–10.5)

## 2011-08-27 LAB — BASIC METABOLIC PANEL
Calcium: 8.2 mg/dL — ABNORMAL LOW (ref 8.4–10.5)
Creatinine, Ser: 1.41 mg/dL — ABNORMAL HIGH (ref 0.50–1.35)
GFR calc Af Amer: 52 mL/min — ABNORMAL LOW (ref >90)
Sodium: 139 mEq/L (ref 135–145)

## 2011-08-27 LAB — CARDIAC PANEL(CRET KIN+CKTOT+MB+TROPI)
CK, MB: 3.4 ng/mL (ref 0.3–4.0)
Total CK: 92 U/L (ref 7–232)
Troponin I: <0.3 ng/mL (ref <0.30)

## 2011-08-27 MED ORDER — COLCHICINE 0.6 MG PO TABS
0.3000 mg | ORAL_TABLET | Freq: Every day | ORAL | Status: DC
  Administered 2011-08-28: 0.3 mg via ORAL
  Filled 2011-08-27: qty 0.5

## 2011-08-27 MED ORDER — SODIUM CHLORIDE 0.9 % IV SOLN
Freq: Once | INTRAVENOUS | Status: AC
  Administered 2011-08-27: 500 mL via INTRAVENOUS

## 2011-08-27 NOTE — Evaluation (Signed)
Occupational Therapy Evaluation Patient Details Name: Karl Stevenson MRN: [id number] DOB: Dec 17, 1926 Today's Date: 08/27/2011  Problem List:  Patient Active Problem List  Diagnoses  . Crohn's disease  . Anemia  . Anemia  . CAD (coronary artery disease)  . HTN (hypertension)  . PVD (peripheral vascular disease)  . Atherosclerosis of renal artery  . Occlusion and stenosis of carotid artery without mention of cerebral infarction  . Pain, limb, left  . Intermittent claudication  . Carotid stenosis  . Ischemic cardiomyopathy  . Chronic systolic heart failure  . Peripheral vascular disease, unspecified  . End stage renal disease  . Hypotension    Past Medical History:  Past Medical History  Diagnosis Date  . Hyperlipidemia   . Hypertension   . Carotid artery occlusion     s/p L CEA in 2010  . Crohn's disease     colon resection  . Bradycardia     Intolerant to beta blockers  . Chronic systolic heart failure     echo 7/12: EF 35-40%, anteroseptal hypokinesis, mild MR.  . Ischemic cardiomyopathy     EF 34% (Lexiscan 06/2011), 35-40% (echo 11/2010)  . Coronary artery disease     CABG 1987, redo 2008; Last LHC 1/10: SVG-OM 1/OM 2 patent, SVG-LAD patent, SVG-OM 3 Patent  . Peripheral vascular disease     Carotid dz s/p L CEA, RAS, AAA, LE dz  . Renal artery stenosis     Dopplers 12/2010 - 1-59% R ostial renal artery stenosis, 2 L renal arteries both widely patent  . Gouty arthritis   . AAA (abdominal aortic aneurysm)     Ectatic abdominal aorta with fusiform dilitation 3.4x3.4 and moderate thrombus burden 12/2010   Past Surgical History:  Past Surgical History  Procedure Date  . Carotid endarterectomy 2010    left  . Colon resection   . Tonsillectomy     as a child  . Pr vein bypass graft,aorto-fem-pop 10/1985  . Coronary artery bypass graft 10/31/85 & 08/19/06  . Vein bypass surgery     LEFT   06/2011    OT Assessment/Plan/Recommendation OT Assessment Clinical  Impression Statement: Pt admitted for R iliofemoral endarterectomy and bovine patch angioplasty.  Pt is at a supervision level in mobility and ADL.  Will have wife available to supervise him as needed at home.  No further OT needs.  No equipment needs.   OT Recommendation/Assessment: Patient does not need any further OT services OT Recommendation Follow Up Recommendations: No OT follow up;Supervision - Intermittent Equipment Recommended: None recommended by OT  OT Evaluation Precautions/Restrictions  Precautions Precautions: Fall Restrictions Weight Bearing Restrictions: No Prior Functioning Home Living Lives With: Spouse Available Help at Discharge: Family Type of Home: Other (Comment) (condo) Home Access: Stairs to enter Secretary/administrator of Steps: 2 Entrance Stairs-Rails: None Home Layout: One level Bathroom Shower/Tub: Health visitor: Handicapped height Bathroom Accessibility: No Home Adaptive Equipment: None;Grab bars in shower Prior Function Level of Independence: Independent Able to Take Stairs?: Yes Driving: Yes Vocation: Retired  ADL ADL Eating/Feeding: Performed;Independent Where Assessed - Eating/Feeding: Chair Grooming: Performed;Wash/dry hands;Supervision/safety Where Assessed - Grooming: Standing at sink Upper Body Bathing: Simulated;Supervision/safety Where Assessed - Upper Body Bathing: Sitting, chair Lower Body Bathing: Simulated;Supervision/safety Where Assessed - Lower Body Bathing: Sitting, chair;Sit to stand from chair Upper Body Dressing: Performed;Independent Where Assessed - Upper Body Dressing: Sitting, bed Lower Body Dressing: Performed;Supervision/safety Where Assessed - Lower Body Dressing: Sitting, chair;Sit to stand from chair Toilet Transfer: Simulated;Supervision/safety  Toilet Transfer Details (indicate cue type and reason): chair to bed Toilet Transfer Method: Ambulating Ambulation Related to ADLs: supervision due  to lines, no device ADL Comments: Wife will supervise showering.  Has grab bars. Vision/Perception  Vision - History Patient Visual Report: No change from baseline Cognition Cognition Overall Cognitive Status: Appears within functional limits for tasks assessed/performed Arousal/Alertness: Awake/alert Orientation Level: Oriented X4 / Intact Behavior During Session: WFL for tasks performed Sensation/Coordination Coordination Gross Motor Movements are Fluid and Coordinated: Yes Fine Motor Movements are Fluid and Coordinated: Yes Extremity Assessment RUE Assessment RUE Assessment: Within Functional Limits LUE Assessment LUE Assessment: Within Functional Limits Mobility  Bed Mobility Bed Mobility: Yes Sit to Supine: 7: Independent Transfers Transfers: Yes Sit to Stand: 5: Supervision;With upper extremity assist;With armrests;From chair/3-in-1 Stand to Sit: 5: Supervision;Without upper extremity assist;To chair/3-in-1;To bed End of Session OT - End of Session Activity Tolerance: Patient tolerated treatment well Patient left: in bed;with call bell in reach;with family/visitor present General Behavior During Session: [facility] for tasks performed Cognition: [facility] for tasks performed   Evern Bio 08/27/2011, 2:18 PM  [phone]

## 2011-08-27 NOTE — Progress Notes (Signed)
UR COMPLETED  

## 2011-08-27 NOTE — Progress Notes (Signed)
Report called to 2000, RN, VSS.

## 2011-08-27 NOTE — Progress Notes (Signed)
[facility]  Subjective  - No real complaints foot feels warm  Objective 95/57 116 99.1 F (37.3 C) (Oral) 15 98%  Intake/Output Summary (Last 24 hours) at 08/27/11 0835 Last data filed at 08/27/11 0600  Gross per 24 hour  Intake 5066.25 ml  Output   1995 ml  Net 3071.25 ml   Right foot pink and warm, right groin incision healing, no hematoma, no palpable pedal pulses, + doppler  Assessment/Planning: Ambulate Transfer 2000 Possible d/c home tomorrow if pain controlled  Karl Stevenson E 08/27/2011 8:35 AM --  Laboratory Lab Results:  [facility] 08/27/11 0205  WBC 6.8  HGB 10.1*  HCT 29.2*  PLT 99*   BMET  Basename 08/27/11 0205  NA 139  K 4.0  CL 106  CO2 27  GLUCOSE 127*  BUN 15  CREATININE 1.41*  CALCIUM 8.2*    COAG Lab Results  Component Value Date   INR 1.03 08/12/2011   INR 1.03 06/26/2011   INR 1.03 11/08/2010   No results found for this basename: PTT    Antibiotics Anti-infectives     Start     Dose/Rate Route Frequency Ordered Stop   08/26/11 1530   cefUROXime (ZINACEF) 1.5 g in dextrose 5 % 50 mL IVPB        1.5 g 100 mL/hr over 30 Minutes Intravenous Every 12 hours 08/26/11 1426 08/27/11 0417   08/25/11 1415   cefUROXime (ZINACEF) 1.5 g in dextrose 5 % 50 mL IVPB        1.5 g 100 mL/hr over 30 Minutes Intravenous  Once 08/25/11 1404 08/26/11 0840

## 2011-08-27 NOTE — Evaluation (Signed)
Physical Therapy Evaluation Patient Details Name: Karl Stevenson MRN: [id number] DOB: August 27, 1926 Today's Date: 08/27/2011  Problem List:  Patient Active Problem List  Diagnoses  . Crohn's disease  . Anemia  . Anemia  . CAD (coronary artery disease)  . HTN (hypertension)  . PVD (peripheral vascular disease)  . Atherosclerosis of renal artery  . Occlusion and stenosis of carotid artery without mention of cerebral infarction  . Pain, limb, left  . Intermittent claudication  . Carotid stenosis  . Ischemic cardiomyopathy  . Chronic systolic heart failure  . Peripheral vascular disease, unspecified  . End stage renal disease    Past Medical History:  Past Medical History  Diagnosis Date  . Hyperlipidemia   . Hypertension   . Carotid artery occlusion     s/p L CEA in 2010  . Crohn's disease     colon resection  . Bradycardia     Intolerant to beta blockers  . Chronic systolic heart failure     echo 7/12: EF 35-40%, anteroseptal hypokinesis, mild MR.  . Ischemic cardiomyopathy     EF 34% (Lexiscan 06/2011), 35-40% (echo 11/2010)  . Coronary artery disease     CABG 1987, redo 2008; Last LHC 1/10: SVG-OM 1/OM 2 patent, SVG-LAD patent, SVG-OM 3 Patent  . Peripheral vascular disease     Carotid dz s/p L CEA, RAS, AAA, LE dz  . Renal artery stenosis     Dopplers 12/2010 - 1-59% R ostial renal artery stenosis, 2 L renal arteries both widely patent  . Gouty arthritis   . AAA (abdominal aortic aneurysm)     Ectatic abdominal aorta with fusiform dilitation 3.4x3.4 and moderate thrombus burden 12/2010   Past Surgical History:  Past Surgical History  Procedure Date  . Carotid endarterectomy 2010    left  . Colon resection   . Tonsillectomy     as a child  . Pr vein bypass graft,aorto-fem-pop 10/1985  . Coronary artery bypass graft 10/31/85 & 08/19/06  . Vein bypass surgery     LEFT   06/2011    PT Assessment/Plan/Recommendation PT Assessment Clinical Impression Statement: pt  presents s/p R Iliofemoral Endartectomy.  pt moving well and very motivated for improving mobility to return to walking [address] with his wife.  Discussed with pt and RN ambulating again later today with Nsg A.   PT Recommendation/Assessment: Patient will need skilled PT in the acute care venue PT Problem List: Decreased activity tolerance;Decreased balance;Decreased mobility Barriers to Discharge: None PT Therapy Diagnosis : Difficulty walking (Decondition/Debility) PT Plan PT Frequency: Min 3X/week PT Treatment/Interventions: DME instruction;Gait training;Stair training;Functional mobility training;Therapeutic activities;Therapeutic exercise;Balance training;Patient/family education PT Recommendation Follow Up Recommendations: No PT follow up Equipment Recommended: None recommended by PT PT Goals  Acute Rehab PT Goals PT Goal Formulation: With patient Time For Goal Achievement: 7 days Pt will go Supine/Side to Sit: Independently PT Goal: Supine/Side to Sit - Progress: Goal set today Pt will go Sit to Supine/Side: Independently PT Goal: Sit to Supine/Side - Progress: Goal set today Pt will go Sit to Stand: Independently PT Goal: Sit to Stand - Progress: Goal set today Pt will go Stand to Sit: Independently PT Goal: Stand to Sit - Progress: Goal set today Pt will Ambulate: >150 feet;Independently PT Goal: Ambulate - Progress: Goal set today Pt will Go Up / Down Stairs: 3-5 stairs;with supervision PT Goal: Up/Down Stairs - Progress: Goal set today  PT Evaluation Precautions/Restrictions  Precautions Precautions: Fall Restrictions Weight Bearing Restrictions:  No Prior Functioning  Home Living Lives With: Spouse Available Help at Discharge: Family Type of Home:  (Condo) Home Access: Stairs to enter Secretary/administrator of Steps: 2 Entrance Stairs-Rails: None Home Layout: One level Bathroom Shower/Tub: Health visitor: Handicapped height Bathroom  Accessibility: No Home Adaptive Equipment: None Prior Function Level of Independence: Independent Able to Take Stairs?: Yes Driving: Yes Vocation: Retired Comments: pt walks [address] with wife 3-5 days/wk.   Cognition Cognition Overall Cognitive Status: Appears within functional limits for tasks assessed/performed Orientation Level: Oriented X4 / Intact Sensation/Coordination   Extremity Assessment RLE Assessment RLE Assessment: Within Functional Limits LLE Assessment LLE Assessment: Within Functional Limits Mobility (including Balance) Bed Mobility Bed Mobility: No Transfers Transfers: Yes Sit to Stand: 5: Supervision;With upper extremity assist;With armrests;From chair/3-in-1 Sit to Stand Details (indicate cue type and reason): demos good technique Stand to Sit: 5: Supervision;With upper extremity assist;With armrests;To chair/3-in-1 Ambulation/Gait Ambulation/Gait: Yes Ambulation/Gait Assistance: 4: Min assist Ambulation/Gait Assistance Details (indicate cue type and reason): pt HR increase to Max of 127 during ambulation, without pt c/o.  pt moves slowly, but well.   Ambulation Distance (Feet): 350 Feet Assistive device: None Gait Pattern: Step-through pattern;Decreased stride length Stairs: No Wheelchair Mobility Wheelchair Mobility: No  Posture/Postural Control Posture/Postural Control: No significant limitations Balance Balance Assessed: No Exercise    End of Session PT - End of Session Equipment Utilized During Treatment: Gait belt Activity Tolerance: Patient tolerated treatment well Patient left: in chair;with call bell in reach;with family/visitor present Nurse Communication: Mobility status for transfers;Mobility status for ambulation General Cognition: WFL for tasks performed  Sunny Schlein, [place] [phone] 08/27/2011, 12:36 PM

## 2011-08-27 NOTE — Progress Notes (Signed)
At 0100 pt needed to have BM, got OOB to bedside commode. BP before ambulation was 97/70 and HR was 123. Upon ambulaton HR increased into the 140's-150's. Got pt back to bed HR still in the 140's and BP 91/58. MD made aware, new orders received to give a 500 cc bolus, draw am labs now with a cardiac panel and to obtain a repeat EKG. Pt otherwise symptomatic. Will cont to monitor.

## 2011-08-27 NOTE — Consult Note (Signed)
CARDIOLOGY CONSULT NOTE  Patient ID: Karl Stevenson, MRN: [id number], DOB/AGE: [id number] 76 y.o. Admit date: 08/26/2011 Date of Consult: 08/27/2011  Primary Physician: Garlan Fillers, MD, MD Primary Cardiologist: Dr. Excell Seltzer Chief Complaint: leg pain Reason for Consult: hypotension/tachycardia  HPI: 78 y/o M with hx of CAD s/p CABGx2, ICM, PVD (AAA, RAS, carotid dz), bradycardia presented to [facility] for planned vascular surgery. He has had severe short distance claudication and was found to have significant left leg internal iliac and common femoral artery stenoses by PV angiogram 1/31. He already underwent L iliofemoral endarterectomy with bovine patch angioplasty, and presented this admission for R iliofemoral endarterectomy and bovine patch angioplasty 08/26/11. These procedures were staged given poor cardiac status. Post-operatively overnight he was note to have increase in his HR into the 130s-140s after settling into bed from using the bedside commode, appears to be sinus tachycardia throughout. Blood pressures have also been soft in the 90s-100s - he received a 500cc NS bolus overnight with improvement in his HR to around 100. He denies any CP, SOB, dizziness, palpitations or awareness of HR. He generally wasn't feeling well earlier but feels better now. Most recent BP is 87 systolic. There was an order in the system for dopamine overnight but this was not needed. He received his Avapro, amlodipine, and Imdur this morning around 10am (BP earlier was 115/64). He has been ambulating well without difficulty - walked with PT with max HR of 127 and no complaints. He is currently asymptomatic and also denies any abdominal pain, nausea, vomiting, orthopnea, LEE, PND. He reports his home BP runs in the 120/70 range. Labs are significant for Hgb 10.1, Plt 99, Cr elevated at 1.41. CE's negative x 1 overnight.   Past Medical History  Diagnosis Date  . Hyperlipidemia   . Hypertension   .  Carotid artery occlusion     s/p L CEA in 2010  . Crohn's disease     colon resection  . S/P CABG (coronary artery bypass graft)     1987 and 2008  . Bradycardia     Intolerant to beta blockers  . Chronic systolic heart failure     echo 7/12: EF 35-40%, anteroseptal hypokinesis, mild MR.  . Ischemic cardiomyopathy     EF 34% (Lexiscan 06/2011), 35-40% (echo 11/2010)  . Coronary artery disease     CABG 1987, redo 2008; Last LHC 1/10: SVG-OM 1/OM 2 patent, SVG-LAD patent, SVG-OM 3 Patent  . Myocardial infarction     PATIENT SEES DR Excell Seltzer   . Peripheral vascular disease   . CHF (congestive heart failure)   . Renal artery stenosis     noted in 2010 per cath. 70-80% Left RAS; last duplex in August 2012 with 1-59% on the right, and no blockage on the left  . Gouty arthritis       Most Recent Cardiac Studies: Lexiscan Myoview 06/16/11  Impression  Exercise Capacity: Lexiscan with no exercise.  BP Response: Normal blood pressure response.  Clinical Symptoms: No chest pain.  ECG Impression: No significant ST segment change suggestive of ischemia.  Comparison with Prior Nuclear Study: No images to compare  Overall Impression: Large anteroapical MI with no ischemia EF 34% Interpretation: Low risk study. Ok to proceed with surgery. Consistent with prior hx. EF stable. He is mod to high risk for CV complications  2D Echo 11/2010 Study Conclusions - Left ventricle: Systolic function was moderately reduced. The estimated ejection fraction was in the range of  35% to 40%. Akinesis of the mid-distal anteroseptal myocardium. - Mitral valve: Mild regurgitation.  Renal arterial Dopplers 8/12:  Ectatic abdominal aorta with fusiform dilitation 3.4x3.4 and moderate thrombus burden Moderate stenosis of celiac axis Normal caliber CIAs. 1-59% R ostial renal artery steosis 2 L renal arteries, both widely patent  Recommendation: f/u AAA 1 year  Carotid Dopplers 06/2011 : 0-39% RICA. Patent L CEA  site.  Cardiac Cath 05/2008: FINDINGS:  1. Left Main: Very small 80-90% narrowing throughout.  2. LAD: A 100% proximal occlusion.  3. LCX: A 100% proximal occlusion at prior stent.  4. RCA: Small and nondominant.  VEIN GRAFTS:  1. Vein graft to OM/OM-2 (sequential) is patent without significant disease. There is a second branch extending from the vein graft filling the mid and distal LAD and backfilling the proximal LAD and the first diagonal.  2. Vein graft to OM-3 is patent with mild distal disease approximately 30-40%.  3. Left subclavian artery has a 40-50% proximal stenosis, but LIMA is atretic.  4. LV: EF is 40%. LVEDP is 7 mmHg.  5. Aortic root shows mild proximal ectasia with no further bypass graft seen.  6. Descending aortic root (abdominal aortic shot) shows an ectatic vessel with mild diffuse disease. There is a 70-80% left renal artery stenosis noted. Right and left iliacs are patent with moderate diffuse disease noted.  IMPRESSION:  1. Severe obstructive native vessel disease.  2. Patent vein graft to first obtuse marginal/second obtuse marginal (sequential graft). Patent vein graft to left anterior descending and patent vein graft to third obtuse marginal.  3. 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  4. A 70-80% stenosis of the left renal artery.  5. Moderate diffuse iliac and femoral atherosclerosis.  PLAN: At this time, I would recommend aggressive medical therapy. We will add Ranexa 500 mg twice daily to his regimen. We will discontinue his nitroglycerin drip. Hopefully, he will be able to be discharged in the morning.   Surgical History:  Past Surgical History  Procedure Date  . Carotid endarterectomy 2010    left  . Colon resection   . Cardiac catheterization 2010    Manage medically   . Tonsillectomy     as a child  . Pr vein bypass graft,aorto-fem-pop 10/1985  . Coronary artery bypass graft 10/31/85 & 08/19/06  . Vein bypass surgery     LEFT   06/2011      Home Meds: Prior to Admission medications   Medication Sig Start Date End Date Taking? Authorizing Provider  amLODipine (NORVASC) 5 MG tablet Take 1 tablet (5 mg total) by mouth daily. 08/13/11 08/12/12 Yes [name], MD  aspirin 81 MG tablet Take 81 mg by mouth daily.    Yes Historical Provider, MD  atorvastatin (LIPITOR) 10 MG tablet Take 10 mg by mouth daily.     Yes Historical Provider, MD  clopidogrel (PLAVIX) 75 MG tablet Take 75 mg by mouth daily.   Yes Historical Provider, MD  COLCRYS 0.6 MG tablet Take 0.6 mg by mouth daily.  04/17/11  Yes Historical Provider, MD  fexofenadine (ALLEGRA) 180 MG tablet Take 180 mg by mouth daily as needed. allergies   Yes Historical Provider, MD  ipratropium (ATROVENT) 0.03 % nasal spray Place 2 sprays into the nose daily.    Yes Historical Provider, MD  isosorbide mononitrate (IMDUR) 60 MG 24 hr tablet Take 60 mg by mouth daily.   Yes Historical Provider, MD  Multiple Vitamin (MULITIVITAMIN WITH MINERALS) TABS Take 1  tablet by mouth daily.   Yes Historical Provider, MD  oxyCODONE (OXY IR/ROXICODONE) 5 MG immediate release tablet Take 5 mg by mouth every 4 (four) hours as needed. For pain 07/16/11  Yes Historical Provider, MD  pantoprazole (PROTONIX) 40 MG tablet Take 40 mg by mouth daily.   Yes Historical Provider, MD  ranolazine (RANEXA) 500 MG 12 hr tablet Take 500 mg by mouth 2 (two) times daily.   Yes Historical Provider, MD  valsartan (DIOVAN) 160 MG tablet Take 160 mg by mouth daily.    Yes Historical Provider, MD  nitroGLYCERIN (NITROSTAT) 0.4 MG SL tablet Place 0.4 mg under the tongue every 5 (five) minutes as needed. For chest pain    Historical Provider, MD    Inpatient Medications:     . sodium chloride   Intravenous Once  . amLODipine  5 mg Oral Daily  . aspirin  81 mg Oral Daily  . atorvastatin  10 mg Oral QHS  . cefUROXime (ZINACEF)  IV  1.5 g Intravenous Q12H  . clopidogrel  75 mg Oral Daily  . colchicine  0.6 mg Oral Daily  .  docusate sodium  100 mg Oral Daily  . ipratropium  2 spray Nasal Daily  . irbesartan  75 mg Oral Daily  . isosorbide mononitrate  60 mg Oral Daily  . loratadine  10 mg Oral Daily  . mulitivitamin with minerals  1 tablet Oral Daily  . pantoprazole  40 mg Oral Q1200  . pneumococcal 23 valent vaccine  0.5 mL Intramuscular Tomorrow-1000  . ranolazine  500 mg Oral BID  . DISCONTD: aspirin  81 mg Oral Daily  . DISCONTD: atorvastatin  10 mg Oral Daily  . DISCONTD: pantoprazole  40 mg Oral Daily    Allergies:  Allergies  Allergen Reactions  . Novocain Other (See Comments)    Cold sweats and headache.     History   Social History  . Marital Status: Married    Spouse Name: N/A    Number of Children: N/A  . Years of Education: N/A   Occupational History  . Not on file.   Social History Main Topics  . Smoking status: Never Smoker   . Smokeless tobacco: Not on file  . Alcohol Use: No  . Drug Use: No  . Sexually Active: Yes   Other Topics Concern  . Not on file   Social History Narrative  . No narrative on file     Family History  Problem Relation Age of Onset  . Heart disease Father   . Cancer Mother   . Anesthesia problems Neg Hx      Review of Systems: General: negative for chills, fever, night sweats or weight changes.  Cardiovascular: see above Dermatological: negative for rash Respiratory: negative for cough or wheezing Urologic: negative for hematuria. No change in urinary frequency/sx Abdominal: negative for nausea, vomiting, diarrhea, bright red blood per rectum, melena, or hematemesis Neurologic: negative for visual changes, syncope, or dizziness All other systems reviewed and are otherwise negative except as noted above.  Labs:  [facility] 08/27/11 0210  CKTOTAL 92  CKMB 3.4  TROPONINI <0.30   Lab Results  Component Value Date   WBC 6.8 08/27/2011   HGB 10.1* 08/27/2011   HCT 29.2* 08/27/2011   MCV 98.0 08/27/2011   PLT 99* 08/27/2011   appears to  have hx of intermittent anemia. Last hgb 2/21 was 11, plts 117 but as low as 91 11/2010  Lab 08/27/11 0205  NA 139  K 4.0  CL 106  CO2 27  BUN 15  CREATININE 1.41*  CALCIUM 8.2*  PROT --  BILITOT --  ALKPHOS --  ALT --  AST --  GLUCOSE 127*   Lab Results  Component Value Date   CHOL 104 11/24/2010   HDL 41 11/24/2010   LDLCALC 49 11/24/2010   TRIG 70 11/24/2010   Radiology/Studies: No results found.  EKG: sinus tach RBBB 118bpm NSST changes, prior anteroseptal infarct. RBBB was present on prior EKG  Physical Exam: Blood pressure 95/57, pulse 116, temperature 99.1 F (37.3 C), temperature source Oral, resp. rate 15, height 5\' 7"  (1.702 m), weight 135 lb 12.9 oz (61.6 kg), SpO2 98.00%. General: Well developed, well nourished alert WM in no acute distress. Well appearing. Head: Normocephalic, atraumatic, sclera non-icteric, no xanthomas, nares are without discharge.  Neck: R carotid bruit noted. JVD not elevated. Lungs: Clear bilaterally to auscultation without wheezes, rales, or rhonchi. Breathing is unlabored. Heart: Borderline tachycardic with S1 S2. No murmurs, rubs, or gallops appreciated. Abdomen: Soft, non-tender, non-distended with hypoactive but present bowel sounds. No hepatomegaly. No rebound/guarding. No obvious abdominal masses. Msk:  Strength and tone appear normal for age. Extremities: No clubbing or cyanosis. No edema.  Distal pedal pulses are 1+ and equal bilaterally. Neuro: Alert and oriented X 3. Moves all extremities spontaneously. Psych:  Responds to questions appropriately with a normal affect.   Assessment and Plan:   1. Post-op sinus tachycardia/hypotension, improving 2. PVD with known carotid dz/3.4cm AAA/LE dz s/p R iliofemoral endarterectomy/bovine patch angioplasty 08/26/11 3. CAD s/p CABGx2 with recent low-risk nuclear (scar, no ischemia) 4. Ischemic cardiomyopathy with EF 35-40% 11/2010 5. H/o bradycardia, not on any AV nodal blocking agents  secondary to this 6. Acute renal insufficiency 7. Post-op anemia/thrombocytopenia  His rhythm is felt to represent sinus tach at this time, although on telemetry cannot completely exclude an atrial tach per review with Dr. Johney Frame. Doubt an ACS; he is currently asymptomatic and ambulating well. He is not hypoxic, SOB or tachypnic. Would continue to monitor on telemetry. Will add hold parameters to his Avapro & Imdur to hold for systolic less than 100. Will hold Norvasc completely for now with hypotension. Would continue to avoid AV nodal blocking agents secondary to bradycardia in setting of RBBB. He does not appear volume overloaded at present; would continue to follow volume status closely. F/u BMET/CBC in AM due to mild post-op anemia and elevated Cr.  Signed, Ronie Spies PA-C 08/27/2011, 12:09 PM   I have seen, examined the patient, and reviewed the above assessment and plan.  Exam as noted by Ms Shea Evans. Pt now recovering postoperatively.  He is presently asymptomatic.  He has had soft BP today with episodes of sinus tachycardia.  RBBB chronically noted.  At this point, he is not volume overloaded.  I would therefore recommend gentle hydration. We will hold antihypertensives until BP improves.    Co Sign: Hillis Range, MD 08/27/2011 5:15 PM

## 2011-08-28 ENCOUNTER — Encounter (HOSPITAL_COMMUNITY): Payer: Self-pay | Admitting: *Deleted

## 2011-08-28 LAB — CBC
HCT: 27.3 % — ABNORMAL LOW (ref 39.0–52.0)
MCHC: 34.8 g/dL (ref 30.0–36.0)
RDW: 13.6 % (ref 11.5–15.5)

## 2011-08-28 LAB — BASIC METABOLIC PANEL
BUN: 17 mg/dL (ref 6–23)
Calcium: 8.1 mg/dL — ABNORMAL LOW (ref 8.4–10.5)
Creatinine, Ser: 1.45 mg/dL — ABNORMAL HIGH (ref 0.50–1.35)
GFR calc Af Amer: 50 mL/min — ABNORMAL LOW (ref >90)
GFR calc non Af Amer: 43 mL/min — ABNORMAL LOW (ref >90)

## 2011-08-28 MED ORDER — VALSARTAN 160 MG PO TABS: 80.0000 mg | ORAL_TABLET | Freq: Every day | ORAL | Status: DC

## 2011-08-28 MED ORDER — METRONIDAZOLE 500 MG PO TABS: 500.0000 mg | ORAL_TABLET | Freq: Three times a day (TID) | ORAL | Status: DC

## 2011-08-28 MED ORDER — OXYCODONE HCL 5 MG PO TABS: 5.0000 mg | ORAL_TABLET | Freq: Four times a day (QID) | ORAL | Status: DC | PRN

## 2011-08-28 NOTE — Discharge Summary (Signed)
[facility] Discharge Summary  Karl Stevenson [birth year] 78 y.o. male  [id number]  Admission Date: 08/26/2011  Discharge Date: 08/28/11  Physician: Karl Hertz, MD  Admission Diagnosis: Peripheral Vascular Disease   HPI:   This is a 78 y.o. male who presents with chief complaint: R CFA stenosis. The patient presents today for R iliofemoral endarterectomy and bovine patch angioplasty. This patient had bilateral severe L CFA stenoses. He has already undergone his L iliofemoral endarterectomy with bovine patch angioplasty. I elected to stage his repairs due to his poor cardiac status.   Hospital Course:  The patient was admitted to the hospital and taken to the operating room on 08/26/2011 and underwent  1. Right iliofemoral endarterectomy with bovine patch angioplasty  2. Intraoperative right leg angiogram  The pt tolerated the procedure well and was transported to the PACU in good condition. A cardiology consult was obtained on POD 1 due to some mild hypotension and tachycardia the postoperative night.  His norvasc was held and his diovan was reduced to 80 mg.  He did have some diarrhea and a c. Diff was obained and he was started on flagyl.  His C. Diff was negative.  The remainder of the hospital course consisted of increasing ambulation and increasing intake of solids without difficulty.  CBC    Component Value Date/Time   WBC 10.0 08/28/2011 0600   RBC 2.76* 08/28/2011 0600   HGB 9.5* 08/28/2011 0600   HCT 27.3* 08/28/2011 0600   PLT 96* 08/28/2011 0600   MCV 98.9 08/28/2011 0600   MCH 34.4* 08/28/2011 0600   MCHC 34.8 08/28/2011 0600   RDW 13.6 08/28/2011 0600   LYMPHSABS 1.7 08/12/2011 1126   MONOABS 0.7 08/12/2011 1126   EOSABS 0.1 08/12/2011 1126   BASOSABS 0.0 08/12/2011 1126    BMET    Component Value Date/Time   NA 136 08/28/2011 0600   K 4.2 08/28/2011 0600   CL 102 08/28/2011 0600   CO2 27 08/28/2011 0600   GLUCOSE 96 08/28/2011 0600   BUN 17 08/28/2011  0600   CREATININE 1.45* 08/28/2011 0600   CALCIUM 8.1* 08/28/2011 0600   GFRNONAA 43* 08/28/2011 0600   GFRAA 50* 08/28/2011 0600     Discharge Instructions:   The patient is discharged to home with extensive instructions on wound care and progressive ambulation.  They are instructed not to drive or perform any heavy lifting until returning to see the physician in his office.  Discharge Orders    Future Appointments: Provider: Department: Dept Phone: Center:   09/10/2011 10:00 AM [facility] [phone] [facility]     Future Orders Please Complete By Expires   Resume previous diet      Driving Restrictions      Comments:   No driving for 2 weeks   Lifting restrictions      Comments:   No lifting for 6 weeks   Call MD for:  temperature >100.5      Call MD for:  redness, tenderness, or signs of infection (pain, swelling, bleeding, redness, odor or green/yellow discharge around incision site)      Call MD for:  severe or increased pain, loss or decreased feeling  in affected limb(s)      may wash over wound with mild soap and water      Scheduling Instructions:   Shower daily with soap and water starting 08/29/11       Discharge Diagnosis:  Peripheral Vascular  Disease  Secondary Diagnosis: Patient Active Problem List  Diagnoses  . Crohn's disease  . Anemia  . Anemia  . CAD (coronary artery disease)  . HTN (hypertension)  . PVD (peripheral vascular disease)  . Atherosclerosis of renal artery  . Occlusion and stenosis of carotid artery without mention of cerebral infarction  . Pain, limb, left  . Intermittent claudication  . Carotid stenosis  . Ischemic cardiomyopathy  . Chronic systolic heart failure  . Peripheral vascular disease, unspecified  . End stage renal disease  . Hypotension   Past Medical History  Diagnosis Date  . Hyperlipidemia   . Hypertension   . Carotid artery occlusion     s/p L CEA in 2010  . Crohn's disease     colon  resection  . Bradycardia     Intolerant to beta blockers  . Chronic systolic heart failure     echo 7/12: EF 35-40%, anteroseptal hypokinesis, mild MR.  . Ischemic cardiomyopathy     EF 34% (Lexiscan 06/2011), 35-40% (echo 11/2010)  . Coronary artery disease     CABG 1987, redo 2008; Last LHC 1/10: SVG-OM 1/OM 2 patent, SVG-LAD patent, SVG-OM 3 Patent  . Peripheral vascular disease     Carotid dz s/p L CEA, RAS, AAA, LE dz  . Renal artery stenosis     Dopplers 12/2010 - 1-59% R ostial renal artery stenosis, 2 L renal arteries both widely patent  . Gouty arthritis   . AAA (abdominal aortic aneurysm)     Ectatic abdominal aorta with fusiform dilitation 3.4x3.4 and moderate thrombus burden 12/2010      Karl Stevenson  Home Medication Instructions [id number]   Printed on:08/28/11 1327  Medication Information                    aspirin 81 MG tablet Take 81 mg by mouth daily.            ipratropium (ATROVENT) 0.03 % nasal spray Place 2 sprays into the nose daily.            atorvastatin (LIPITOR) 10 MG tablet Take 10 mg by mouth daily.             fexofenadine (ALLEGRA) 180 MG tablet Take 180 mg by mouth daily as needed. allergies           nitroGLYCERIN (NITROSTAT) 0.4 MG SL tablet Place 0.4 mg under the tongue every 5 (five) minutes as needed. For chest pain           isosorbide mononitrate (IMDUR) 60 MG 24 hr tablet Take 60 mg by mouth daily.           Multiple Vitamin (MULITIVITAMIN WITH MINERALS) TABS Take 1 tablet by mouth daily.           pantoprazole (PROTONIX) 40 MG tablet Take 40 mg by mouth daily.           clopidogrel (PLAVIX) 75 MG tablet Take 75 mg by mouth daily.           ranolazine (RANEXA) 500 MG 12 hr tablet Take 500 mg by mouth 2 (two) times daily.           COLCRYS 0.6 MG tablet Take 0.6 mg by mouth daily.            oxyCODONE (OXY IR/ROXICODONE) 5 MG immediate release tablet Take 5 mg by mouth every 4 (four) hours as needed. For pain  oxyCODONE (OXY IR/ROXICODONE) 5 MG immediate release tablet Take 1 tablet (5 mg total) by mouth every 6 (six) hours as needed. #30 NR          valsartan (DIOVAN) 160 MG tablet Take 0.5 tablets (80 mg total) by mouth daily.           metroNIDAZOLE (FLAGYL) 500 MG tablet Take 1 tablet (500 mg total) by mouth 3 (three) times daily.             Disposition: home  Patient's condition: is Good  Follow up: 1. Dr. Imogene Burn in 2 weeks   Newton Pigg, PA-C [facility] [phone] 08/28/2011  1:27 PM  Addendum  I have independently interviewed and examined the patient, and I agree with the physician assistant's discharge summary.  This underwent an uneventful right iliofemoral endarterectomy with bovine patch angioplasty.  He recovered appropriately and was discharged able to ambulate, pain controlled with oral medications, and able to tolerate oral diet.  He will follow up in the office in two weeks for wound check.  Leonides Sake, MD [facility] [place] Office: [phone] Pager: [phone]  09/02/2011, 3:44 PM

## 2011-08-28 NOTE — Progress Notes (Signed)
    Subjective:  Feels fine today. No chest pain, dyspnea, or palpitations.  Objective:  Vital Signs in the last 24 hours: Temp:  [98.2 F (36.8 C)-98.9 F (37.2 C)] 98.9 F (37.2 C) (04/19 0435) Pulse Rate:  [90-102] 90  (04/19 0435) Resp:  [18-20] 19  (04/19 0435) BP: (90-128)/(48-65) 90/48 mmHg (04/19 0435) SpO2:  [96 %-100 %] 96 % (04/19 0435) Weight:  [61.508 kg (135 lb 9.6 oz)] 61.508 kg (135 lb 9.6 oz) (04/19 0435)  Intake/Output from previous day: 04/18 0701 - 04/19 0700 In: 75 [I.V.:75] Out: 500 [Urine:500]  Physical Exam: Pt is alert and oriented, pleasant elderly male in NAD HEENT: normal Neck: JVP - normal Lungs: CTA bilaterally CV: RRR without murmur or gallop Abd: soft, NT, Positive BS, no hepatomegaly Ext: no C/C/E, right groin incision looks good Skin: warm/dry no rash   Lab Results:  Basename 08/28/11 0600 08/27/11 0205  WBC 10.0 6.8  HGB 9.5* 10.1*  PLT 96* 99*    Basename 08/28/11 0600 08/27/11 0205  NA 136 139  K 4.2 4.0  CL 102 106  CO2 27 27  GLUCOSE 96 127*  BUN 17 15  CREATININE 1.45* 1.41*    Basename 08/27/11 0210  TROPONINI <0.30   Tele: sinus rhythm, hr in the 80's this am  Assessment/Plan:  1. Postoperative hypotension and tachycardia, now resolved.  2. CAD s/p CABG x 2 - no ischemic symptoms 3. Severe PAD - post-op management per Dr Imogene Burn  Appears stable from CV perspective. His BP is still a little low and I would favor at least temporary reduction in his antihypertensive meds. Would HOLD amlodipine and reduce diovan to 80 mg at discharge. I advised him to push fluids at home. Will see back in follow-up in a few weeks to monitor BP. I will arrange appt - give me a call if any other problems or questions arise. thx  [name], M.D. 08/28/2011, 9:31 AM

## 2011-08-28 NOTE — Progress Notes (Addendum)
[facility] Progress Note  08/28/2011 7:24 AM POD 2  Subjective:  "I feel good even when my blood pressure has been low"  Tm 99 now 98.9   90-120s systolic  HR 90-100 reg  96%RA Filed Vitals:   08/28/11 0435  BP: 90/48  Pulse: 90  Temp: 98.9 F (37.2 C)  Resp: 19    Physical Exam: Incisions:  C/d/i Extremities:  RLE warm with + palpable PT pulse  CBC    Component Value Date/Time   WBC 10.0 08/28/2011 0600   RBC 2.76* 08/28/2011 0600   HGB 9.5* 08/28/2011 0600   HCT 27.3* 08/28/2011 0600   PLT 96* 08/28/2011 0600   MCV 98.9 08/28/2011 0600   MCH 34.4* 08/28/2011 0600   MCHC 34.8 08/28/2011 0600   RDW 13.6 08/28/2011 0600   LYMPHSABS 1.7 08/12/2011 1126   MONOABS 0.7 08/12/2011 1126   EOSABS 0.1 08/12/2011 1126   BASOSABS 0.0 08/12/2011 1126    BMET    Component Value Date/Time   NA 136 08/28/2011 0600   K 4.2 08/28/2011 0600   CL 102 08/28/2011 0600   CO2 27 08/28/2011 0600   GLUCOSE 96 08/28/2011 0600   BUN 17 08/28/2011 0600   CREATININE 1.45* 08/28/2011 0600   CALCIUM 8.1* 08/28/2011 0600   GFRNONAA 43* 08/28/2011 0600   GFRAA 50* 08/28/2011 0600    INR    Component Value Date/Time   INR 1.03 08/12/2011 1126     Intake/Output Summary (Last 24 hours) at 08/28/11 0724 Last data filed at 08/27/11 1300  Gross per 24 hour  Intake      0 ml  Output    300 ml  Net   -300 ml     Assessment/Plan:  78 y.o. male is s/p  1. Right iliofemoral endarterectomy with bovine patch angioplasty  2. Intraoperative right leg angiogram  POD 2 -acute surgical blood loss anemia-pt BP 90 systolic and EF 35-40%. Pt is asymptomatic -BUN/Cr only up slightly this am-stable -appreciate cards help and input. -ambulate   Newton Pigg, PA-C [facility] [phone] 08/28/2011 7:24 AM   Some diarrhea today, thinks its related to prior bowel issues Will start Flagyl and send Cdiff Groin incision healing foot warm Appreciate Cards input D/C  home  Fabienne Bruns, MD [facility] [place] Office: [phone] Pager: [phone]

## 2011-08-28 NOTE — Progress Notes (Signed)
Physical Therapy Treatment / Discharge Patient Details Name: Karl Stevenson MRN: [id number] DOB: [birth year] Today's Date: 08/28/2011 Time: 4098-1191 PT Time Calculation (min): 18 min  PT Assessment / Plan / Recommendation Comments on Treatment Session  Pt ambulating and mobilizing well. Pt encouraged to continue ambulation and HEP to maintain mobility of bil LE. Pt has met all goals without need for further therapy and pt aware and agreeable.    Follow Up Recommendations  No PT follow up    Equipment Recommendations       Frequency     Plan Discharge plan remains appropriate    Precautions / Restrictions     Pertinent Vitals/Pain none    Mobility  Transfers Transfers: Sit to Stand;Stand to Sit Sit to Stand: 7: Independent;From chair/3-in-1 Stand to Sit: 7: Independent;To chair/3-in-1 Ambulation/Gait Ambulation/Gait Assistance: 7: Independent Ambulation Distance (Feet): 1000 Feet Assistive device: None Gait Pattern: Within Functional Limits Stairs: Yes Stairs Assistance: 7: Independent Stair Management Technique: No rails Number of Stairs: 3     Exercises General Exercises - Lower Extremity Long Arc Quad: AROM;Both;20 reps;Seated Hip Flexion/Marching: AROM;Both;5 reps;Seated   PT Goals Acute Rehab PT Goals PT Goal: Sit to Stand - Progress: Met PT Goal: Stand to Sit - Progress: Met PT Goal: Ambulate - Progress: Met PT Goal: Up/Down Stairs - Progress: Met  Visit Information  Last PT Received On: 08/28/11 Assistance Needed: +1    Subjective Data  Subjective: I'm doing better   Cognition  Overall Cognitive Status: Appears within functional limits for tasks assessed/performed Arousal/Alertness: Awake/alert Orientation Level: Appears intact for tasks assessed Behavior During Session: [facility] for tasks performed    Balance  Balance Balance Assessed: Yes Static Standing Balance Static Standing - Balance Support: No upper extremity supported Static Standing -  Level of Assistance: 7: Independent Static Standing - Comment/# of Minutes: 3 High Level Balance High Level Balance Comments: pt able to pick object off floor and turn 360degrees in 4 seconds  End of Session PT - End of Session Activity Tolerance: Patient tolerated treatment well Patient left: in chair;with call bell/phone within reach;with family/visitor present Nurse Communication: Mobility status    Delorse Lek 08/28/2011, 2:06 PM Delaney Meigs, PT [phone]

## 2011-08-29 ENCOUNTER — Emergency Department (HOSPITAL_COMMUNITY)
Admission: EM | Admit: 2011-08-29 | Discharge: 2011-08-29 | Disposition: A | Discharge status: Home / Self Care | Payer: Medicare FFS | Attending: Emergency Medicine | Admitting: Emergency Medicine

## 2011-08-29 ENCOUNTER — Encounter (HOSPITAL_COMMUNITY): Payer: Self-pay | Admitting: *Deleted

## 2011-08-29 DIAGNOSIS — L089 Local infection of the skin and subcutaneous tissue, unspecified: Secondary | ICD-10-CM

## 2011-08-29 DIAGNOSIS — I2589 Other forms of chronic ischemic heart disease: Secondary | ICD-10-CM | POA: Insufficient documentation

## 2011-08-29 DIAGNOSIS — Z9889 Other specified postprocedural states: Secondary | ICD-10-CM | POA: Insufficient documentation

## 2011-08-29 DIAGNOSIS — E785 Hyperlipidemia, unspecified: Secondary | ICD-10-CM | POA: Insufficient documentation

## 2011-08-29 DIAGNOSIS — I2510 Atherosclerotic heart disease of native coronary artery without angina pectoris: Secondary | ICD-10-CM | POA: Insufficient documentation

## 2011-08-29 DIAGNOSIS — I5022 Chronic systolic (congestive) heart failure: Secondary | ICD-10-CM | POA: Insufficient documentation

## 2011-08-29 DIAGNOSIS — K5090 Crohn's disease, unspecified, without complications: Secondary | ICD-10-CM | POA: Insufficient documentation

## 2011-08-29 DIAGNOSIS — N4889 Other specified disorders of penis: Secondary | ICD-10-CM | POA: Insufficient documentation

## 2011-08-29 DIAGNOSIS — I10 Essential (primary) hypertension: Secondary | ICD-10-CM | POA: Insufficient documentation

## 2011-08-29 DIAGNOSIS — Z951 Presence of aortocoronary bypass graft: Secondary | ICD-10-CM | POA: Insufficient documentation

## 2011-08-29 LAB — DIFFERENTIAL
Eosinophils Relative: 1 % (ref 0–5)
Lymphocytes Relative: 14 % (ref 12–46)
Lymphs Abs: 0.9 10*3/uL (ref 0.7–4.0)
Monocytes Absolute: 0.5 10*3/uL (ref 0.1–1.0)
Monocytes Relative: 8 % (ref 3–12)

## 2011-08-29 LAB — BASIC METABOLIC PANEL
BUN: 27 mg/dL — ABNORMAL HIGH (ref 6–23)
CO2: 25 mEq/L (ref 19–32)
Calcium: 8.2 mg/dL — ABNORMAL LOW (ref 8.4–10.5)
Glucose, Bld: 117 mg/dL — ABNORMAL HIGH (ref 70–99)
Sodium: 139 mEq/L (ref 135–145)

## 2011-08-29 LAB — URINALYSIS, ROUTINE W REFLEX MICROSCOPIC
Glucose, UA: NEGATIVE mg/dL
Hgb urine dipstick: NEGATIVE
Protein, ur: 30 mg/dL — AB

## 2011-08-29 LAB — CBC
HCT: 27.1 % — ABNORMAL LOW (ref 39.0–52.0)
Hemoglobin: 9.1 g/dL — ABNORMAL LOW (ref 13.0–17.0)
MCV: 97.5 fL (ref 78.0–100.0)
RBC: 2.78 MIL/uL — ABNORMAL LOW (ref 4.22–5.81)
WBC: 6.5 10*3/uL (ref 4.0–10.5)

## 2011-08-29 LAB — URINE MICROSCOPIC-ADD ON

## 2011-08-29 MED ORDER — CEPHALEXIN 500 MG PO CAPS: 500.0000 mg | ORAL_CAPSULE | Freq: Four times a day (QID) | ORAL | Status: AC

## 2011-08-29 MED ORDER — CEPHALEXIN 250 MG PO CAPS
500.0000 mg | ORAL_CAPSULE | Freq: Once | ORAL | Status: AC
  Administered 2011-08-29: 500 mg via ORAL
  Filled 2011-08-29: qty 2

## 2011-08-29 NOTE — ED Notes (Signed)
Swollen penis since this am.  He had surgery on his rt femoral artery  CLEANED OUT this past Wednesday.  He had a foley cath until yesterday and the doctor thinks this is the problem

## 2011-08-29 NOTE — ED Notes (Signed)
MD at bedside. 

## 2011-08-29 NOTE — ED Notes (Signed)
Pt states understanding of discharge instructions 

## 2011-08-29 NOTE — Discharge Instructions (Signed)
Karl Stevenson, you have swelling of the penis and mild redness of the skin of the lower abdomen.  This is probably the result of having a foley catheter placed and removed after your vascular surgery.  Dr. Ignacia Palma advised you to take the antibiotic medicine Keflex 500 mg four times per day for one week to treat this infection of the skin.  Return to the ED if you develop high fever, if the skin swelling gets worse, or if you have other problems that cause concern.  Skin Infections A skin infection usually develops as a result of disruption of the skin barrier.  CAUSES  A skin infection might occur following:  Trauma or an injury to the skin such as a cut or insect sting.   Inflammation (as in eczema).   Breaks in the skin between the toes (as in athlete's foot).   Swelling (edema).  SYMPTOMS  The legs are the most common site affected. Usually there is:  Redness.   Swelling.   Pain.   There may be red streaks in the area of the infection.  TREATMENT   Minor skin infections may be treated with topical antibiotics, but if the skin infection is severe, hospital care and intravenous (IV) antibiotic treatment may be needed.   Most often skin infections can be treated with oral antibiotic medicine as well as proper rest and elevation of the affected area until the infection improves.   If you are prescribed oral antibiotics, it is important to take them as directed and to take all the pills even if you feel better before you have finished all of the medicine.   You may apply warm compresses to the area for 20-30 minutes 4 times daily.  You might need a tetanus shot now if:  You have no idea when you had the last one.   You have never had a tetanus shot before.   Your wound had dirt in it.  If you need a tetanus shot and you decide not to get one, there is a rare chance of getting tetanus. Sickness from tetanus can be serious. If you get a tetanus shot, your arm may swell and become red  and warm at the shot site. This is common and not a problem. SEEK MEDICAL CARE IF:  The pain and swelling from your infection do not improve within 2 days.  SEEK IMMEDIATE MEDICAL CARE IF:  You develop a fever, chills, or other serious problems.  Document Released: 06/04/2004 Document Revised: 04/16/2011 Document Reviewed: 04/16/2008 [facility] Patient Information 2012 [place], [place].

## 2011-08-30 NOTE — ED Provider Notes (Signed)
History     CSN: [id number]  Arrival date & time 08/29/11  1857   First MD Initiated Contact with Patient 08/29/11 2141      Chief Complaint  Patient presents with  . swollen penis     (Consider location/radiation/quality/duration/timing/severity/associated sxs/prior treatment) HPI Comments: Pt is an 78 year old man who had had a right iliofemoral endarterectomy on 08/26/2011.  During and after the procedure he had had an indwelling foley catheter.  This was discontinued yesterday when he was discharged.  Now he notes swelling of the skin of his penis, and was advised by his surgeon to be checked for infection.  Patient is a 78 y.o. male presenting with male genitourinary complaint. The history is provided by the patient and medical records. No language interpreter was used.  Male GU Problem Primary symptoms include no dysuria. Primary symptoms comment: Swelling of penile skin. This is a new problem. The current episode started yesterday. The problem occurs constantly. The problem has been gradually worsening. Context: Does not affect urination. Penile discharge characteristics: No discharge. There has been no fever. He has tried nothing for the symptoms. Associated medical issues comments: Recent foley catheterization..    Past Medical History  Diagnosis Date  . Hyperlipidemia   . Hypertension   . Carotid artery occlusion     s/p L CEA in 2010  . Crohn's disease     colon resection  . Bradycardia     Intolerant to beta blockers  . Chronic systolic heart failure     echo 7/12: EF 35-40%, anteroseptal hypokinesis, mild MR.  . Ischemic cardiomyopathy     EF 34% (Lexiscan 06/2011), 35-40% (echo 11/2010)  . Coronary artery disease     CABG 1987, redo 2008; Last LHC 1/10: SVG-OM 1/OM 2 patent, SVG-LAD patent, SVG-OM 3 Patent  . Peripheral vascular disease     Carotid dz s/p L CEA, RAS, AAA, LE dz  . Renal artery stenosis     Dopplers 12/2010 - 1-59% R ostial renal artery stenosis, 2 L  renal arteries both widely patent  . Gouty arthritis   . AAA (abdominal aortic aneurysm)     Ectatic abdominal aorta with fusiform dilitation 3.4x3.4 and moderate thrombus burden 12/2010    Past Surgical History  Procedure Date  . Carotid endarterectomy 2010    left  . Colon resection   . Tonsillectomy     as a child  . Pr vein bypass graft,aorto-fem-pop 10/1985  . Coronary artery bypass graft 10/31/85 & 08/19/06  . Vein bypass surgery     LEFT   06/2011  . Endarterectomy 08/26/2011    Procedure: ENDARTERECTOMY ILIAC;  Surgeon: [name], MD;  Location: [facility] OR;  Service: Vascular;  Laterality: Right;  Right Femoral Artery Endarterectomy with vascu guard patch angioplasty & intraoperative arteriogram.    Family History  Problem Relation Age of Onset  . Heart disease Father   . Cancer Mother   . Anesthesia problems Neg Hx     History  Substance Use Topics  . Smoking status: Never Smoker   . Smokeless tobacco: Not on file  . Alcohol Use: No      Review of Systems  Constitutional: Negative.  Negative for fever and chills.  HENT: Negative.   Eyes: Negative.   Respiratory: Negative.   Cardiovascular: Negative.   Gastrointestinal: Negative.   Genitourinary: Positive for penile swelling. Negative for dysuria, urgency, discharge, scrotal swelling, genital sores and testicular pain.  Penile swelling.  Musculoskeletal: Negative.   Skin: Positive for wound.  Neurological: Negative.   Psychiatric/Behavioral: Negative.     Allergies  Novocain  Home Medications   Current Outpatient Rx  Name Route Sig Dispense Refill  . ASPIRIN 81 MG PO TABS Oral Take 81 mg by mouth daily.     . ATORVASTATIN CALCIUM 10 MG PO TABS Oral Take 10 mg by mouth daily.      [name] CLOPIDOGREL BISULFATE 75 MG PO TABS Oral Take 75 mg by mouth daily.    [name] COLCRYS 0.6 MG PO TABS Oral Take 0.6 mg by mouth daily as needed. For gout    . FEXOFENADINE HCL 180 MG PO TABS Oral Take 180 mg by mouth daily as  needed. allergies    . IPRATROPIUM BROMIDE 0.03 % NA SOLN Nasal Place 2 sprays into the nose daily.     . ISOSORBIDE MONONITRATE ER 60 MG PO TB24 Oral Take 60 mg by mouth daily.    [name] METRONIDAZOLE 500 MG PO TABS Oral Take 500 mg by mouth 3 (three) times daily.    . ADULT MULTIVITAMIN W/MINERALS CH Oral Take 1 tablet by mouth daily.    [name] NITROGLYCERIN 0.4 MG SL SUBL Sublingual Place 0.4 mg under the tongue every 5 (five) minutes as needed. For chest pain    . OXYCODONE HCL 5 MG PO TABS Oral Take 5 mg by mouth every 4 (four) hours as needed. For pain    . PANTOPRAZOLE SODIUM 40 MG PO TBEC Oral Take 40 mg by mouth daily.    [name] RANOLAZINE ER 500 MG PO TB12 Oral Take 500 mg by mouth 2 (two) times daily.    [name] VALSARTAN 160 MG PO TABS Oral Take 80 mg by mouth daily.    . CEPHALEXIN 500 MG PO CAPS Oral Take 1 capsule (500 mg total) by mouth 4 (four) times daily. 28 capsule 0    BP 140/85  Pulse 81  Temp(Src) 97 F (36.1 C) (Oral)  Resp 18  SpO2 100%  Physical Exam  Nursing note and vitals reviewed. Constitutional: He is oriented to person, place, and time.       Pleasant elderly man, in no distress at rest.  HENT:  Head: Normocephalic and atraumatic.  Right Ear: External ear normal.  Left Ear: External ear normal.  Mouth/Throat: Oropharynx is clear and moist.  Eyes: Conjunctivae and EOM are normal. Pupils are equal, round, and reactive to light. No scleral icterus.  Neck: Normal range of motion. Neck supple.  Cardiovascular: Normal rate, regular rhythm and normal heart sounds.   Pulmonary/Chest: Effort normal and breath sounds normal.  Abdominal: Soft. Bowel sounds are normal.  Genitourinary:       Uncircumcised male with no urethral discharge or drainage from under the foreskin.  The penile skin is diffusely swollen but is not red or tender.  On the suprapubic skin above the left inguinal region there is some skin redness and mild tenderness, but no crepitance or subucutaneous emphysema.   The right inguinal incision is covered with skin glue and appears to be healing well without evidence of infection.  Musculoskeletal: Normal range of motion. He exhibits no edema and no tenderness.  Neurological: He is alert and oriented to person, place, and time.       No sensory or motor deficit.  Skin:       As per genitourinary exam.  Psychiatric: He has a normal mood and affect. His behavior is normal.  ED Course  Procedures (including critical care time)  Results for orders placed during the hospital encounter of 08/29/11  CBC      Component Value Range   WBC 6.5  4.0 - 10.5 (K/uL)   RBC 2.78 (*) 4.22 - 5.81 (MIL/uL)   Hemoglobin 9.1 (*) 13.0 - 17.0 (g/dL)   HCT 57.3 (*) 22.0 - 52.0 (%)   MCV 97.5  78.0 - 100.0 (fL)   MCH 32.7  26.0 - 34.0 (pg)   MCHC 33.6  30.0 - 36.0 (g/dL)   RDW 25.4  27.0 - 62.3 (%)   Platelets 114 (*) 150 - 400 (K/uL)  DIFFERENTIAL      Component Value Range   Neutrophils Relative 77  43 - 77 (%)   Neutro Abs 5.0  1.7 - 7.7 (K/uL)   Lymphocytes Relative 14  12 - 46 (%)   Lymphs Abs 0.9  0.7 - 4.0 (K/uL)   Monocytes Relative 8  3 - 12 (%)   Monocytes Absolute 0.5  0.1 - 1.0 (K/uL)   Eosinophils Relative 1  0 - 5 (%)   Eosinophils Absolute 0.1  0.0 - 0.7 (K/uL)   Basophils Relative 0  0 - 1 (%)   Basophils Absolute 0.0  0.0 - 0.1 (K/uL)  BASIC METABOLIC PANEL      Component Value Range   Sodium 139  135 - 145 (mEq/L)   Potassium 3.3 (*) 3.5 - 5.1 (mEq/L)   Chloride 105  96 - 112 (mEq/L)   CO2 25  19 - 32 (mEq/L)   Glucose, Bld 117 (*) 70 - 99 (mg/dL)   BUN 27 (*) 6 - 23 (mg/dL)   Creatinine, Ser 7.62 (*) 0.50 - 1.35 (mg/dL)   Calcium 8.2 (*) 8.4 - 10.5 (mg/dL)   GFR calc non Af Amer 38 (*) >90 (mL/min)   GFR calc Af Amer 45 (*) >90 (mL/min)  URINALYSIS, ROUTINE W REFLEX MICROSCOPIC      Component Value Range   Color, Urine AMBER (*) YELLOW    APPearance CLOUDY (*) CLEAR    Specific Gravity, Urine 1.024  1.005 - 1.030    pH 5.0  5.0 -  8.0    Glucose, UA NEGATIVE  NEGATIVE (mg/dL)   Hgb urine dipstick NEGATIVE  NEGATIVE    Bilirubin Urine SMALL (*) NEGATIVE    Ketones, ur NEGATIVE  NEGATIVE (mg/dL)   Protein, ur 30 (*) NEGATIVE (mg/dL)   Urobilinogen, UA 1.0  0.0 - 1.0 (mg/dL)   Nitrite NEGATIVE  NEGATIVE    Leukocytes, UA SMALL (*) NEGATIVE   URINE MICROSCOPIC-ADD ON      Component Value Range   Squamous Epithelial / LPF RARE  RARE    WBC, UA 3-6  <3 (WBC/hpf)   RBC / HPF 0-2  <3 (RBC/hpf)   Bacteria, UA RARE  RARE    CBC showed a mild anemia but normal WBC and differential. UA did not suggest UTI.  Rx for skin infection with Keflex.      1. Infection, skin          Carleene Cooper III, MD 08/30/11 1345

## 2011-08-31 ENCOUNTER — Telehealth: Payer: Self-pay

## 2011-08-31 LAB — URINE CULTURE
Colony Count: NO GROWTH
Culture: NO GROWTH

## 2011-08-31 NOTE — Telephone Encounter (Signed)
Pt. Called to report having gone to the ER on 08/29/11 due to swelling in penis.  Wanted to make Dr. Imogene Burn aware of the symptoms, and that he was started on antibiotic.  States has a lot of swelling in penis, and some redness in mid-left suprapubic area.  Denies any increase in swelling since eval. In ER.  Denies any open wounds/sores, fever or chills, or problems with urination.  Started antibiotic(Cephalexin) on Saturday.  Pt. Advised to continue to monitor for fever/chills, increased redness/swelling/warmth, and for any open sores/ drainage.  Encouraged to give antibiotic longer to work, and notify office if symptoms don't improve within next 24-48 hrs.  Will make Dr. Imogene Burn aware.

## 2011-09-10 ENCOUNTER — Encounter: Payer: Self-pay | Admitting: Vascular Surgery

## 2011-09-10 ENCOUNTER — Ambulatory Visit (INDEPENDENT_AMBULATORY_CARE_PROVIDER_SITE_OTHER): Payer: Medicare FFS

## 2011-09-10 VITALS — BP 124/68 | HR 73 | Resp 14

## 2011-09-10 DIAGNOSIS — I2510 Atherosclerotic heart disease of native coronary artery without angina pectoris: Secondary | ICD-10-CM

## 2011-09-10 DIAGNOSIS — I10 Essential (primary) hypertension: Secondary | ICD-10-CM

## 2011-09-10 MED ORDER — VALSARTAN 80 MG PO TABS: 80.0000 mg | ORAL_TABLET | Freq: Every day | ORAL | Status: DC

## 2011-09-10 NOTE — Progress Notes (Signed)
Pt came into the office for a BP check to follow-up recent hypotension and tachycardia during hospitalization.  The pt's medications were reviewed.  BP 124/68 left arm (regular cuff), pulse 73, resp 14.  The pt is doing well and has no complaints today.  The pt did bring in BP and pulse readings from home.  On average the pt's BP is 130/60-80, pulse 60-80.  The pt does have a few heart rates in the 100-120 range.  The pt said this is when he has been busy around the house and does not sit down prior to checking vitals.  I made the pt aware that he should sit down and wait a few minutes before checking vitals.  If the pt continues to have pulse readings above 100 he will contact our office.

## 2011-09-11 ENCOUNTER — Ambulatory Visit (INDEPENDENT_AMBULATORY_CARE_PROVIDER_SITE_OTHER): Payer: Medicare FFS | Admitting: Vascular Surgery

## 2011-09-11 ENCOUNTER — Ambulatory Visit (INDEPENDENT_AMBULATORY_CARE_PROVIDER_SITE_OTHER): Payer: Medicare FFS | Admitting: *Deleted

## 2011-09-11 ENCOUNTER — Encounter: Payer: Self-pay | Admitting: Vascular Surgery

## 2011-09-11 VITALS — BP 111/68 | HR 66 | Temp 97.6°F | Ht 67.0 in | Wt 134.0 lb

## 2011-09-11 DIAGNOSIS — I771 Stricture of artery: Secondary | ICD-10-CM

## 2011-09-11 DIAGNOSIS — I70203 Unspecified atherosclerosis of native arteries of extremities, bilateral legs: Secondary | ICD-10-CM

## 2011-09-11 DIAGNOSIS — R1909 Other intra-abdominal and pelvic swelling, mass and lump: Secondary | ICD-10-CM

## 2011-09-11 DIAGNOSIS — Z48812 Encounter for surgical aftercare following surgery on the circulatory system: Secondary | ICD-10-CM

## 2011-09-11 DIAGNOSIS — I739 Peripheral vascular disease, unspecified: Secondary | ICD-10-CM

## 2011-09-11 NOTE — Progress Notes (Signed)
[facility] [place]  Postoperative Visit  History of Present Illness  Karl Stevenson is a 78 y.o. year old male who presents for postoperative follow-up for: Right iliofemoral endarterectomy with bovine patch angioplasty (Date: 08/26/11).  The patient's wounds are healed but he has a "big knot in the right groin".  The patient notes resolution of lower extremity symptoms.  The patient is able to complete their activities of daily living.  The patient's current symptoms are: right medial thigh pain.  Physical Examination  Filed Vitals:   09/11/11 0914  BP: 111/68  Pulse: 66  Temp:    RLE: Incisions are healed, palpable ballotable masses in right groin with palpable pulse, underneath incision , pedal pulses are faintly palpable  R groin duplex (Date: 09/11/11): hematomas x 2 without flow into them, normal waveforms in underlying arterial  Medical Decision Making  Karl Stevenson is a 78 y.o. year old male who presents s/p R iliofemoral endarterectomy complicated by hematoma.  The patient's R groin incision healing appropriately with resolution of pre-operative symptoms.  I expect the hematoma to resolve with time.  I will have him come back in two week to re-evaluated the incision and hematomas. I discussed in depth with the patient the nature of atherosclerosis, and emphasized the importance of maximal medical management including strict control of blood pressure, blood glucose, and lipid levels, obtaining regular exercise, and cessation of smoking.  The patient is aware that without maximal medical management the underlying atherosclerotic disease process will progress, limiting the benefit of any interventions. The patient's surveillance will included ABI which will be completed in: 6 months, at which time the patient will be re-evaluated.   I emphasized the importance of routine surveillance of the patient's bypass, as the vascular surgery literature emphasize the  improved patency possible with assisted primary patency procedures versus secondary patency procedures. The patient agrees to participate in their maximal medical care and routine surveillance.  Thank you for allowing [place] to participate in this patient's care.  Leonides Sake, MD [facility] [place] Office: [phone] Pager: [phone]

## 2011-09-18 NOTE — Procedures (Unsigned)
VASCULAR LAB EXAM  INDICATION:  Rule out right groin pseudoaneurysm following right femoral endarterectomy 08/26/2011.  HISTORY: Diabetes:  No Cardiac:  CABG Hypertension:  Yes  EXAM:  Patent right lower extremity arterial system with no evidence of pseudoaneurysm. Triphasic wave forms noted in the right common femoral artery and proximal superficial femoral artery.  IMPRESSION:  Patent and durable right lower extremity arterial system without evidence of pseudoaneurysm. Of note:  There are two possible hematomas noted in the right groin, the first measuring 2.15 cm x 2.77 cm; the second measures 2.17 x 2.44 cm. These measurements were taken directly over the two lumps in the patient's groin.  ___________________________________________ [name], MD  EM/MEDQ  D:  09/11/2011  T:  09/11/2011  Job:  [id number]

## 2011-09-24 ENCOUNTER — Encounter: Payer: Self-pay | Admitting: Vascular Surgery

## 2011-09-25 ENCOUNTER — Encounter: Payer: Self-pay | Admitting: Vascular Surgery

## 2011-09-25 ENCOUNTER — Ambulatory Visit (INDEPENDENT_AMBULATORY_CARE_PROVIDER_SITE_OTHER): Payer: Medicare FFS | Admitting: Vascular Surgery

## 2011-09-25 VITALS — BP 113/63 | HR 65 | Resp 12 | Ht 67.0 in | Wt 133.3 lb

## 2011-09-25 DIAGNOSIS — I70203 Unspecified atherosclerosis of native arteries of extremities, bilateral legs: Secondary | ICD-10-CM

## 2011-09-25 DIAGNOSIS — I771 Stricture of artery: Secondary | ICD-10-CM

## 2011-09-25 NOTE — Progress Notes (Signed)
[facility] [place]  Postoperative Visit  History of Present Illness  Karl Stevenson is a 78 y.o. year old male who presents for postoperative follow-up for: Right iliofemoral endarterectomy with bovine patch angioplasty (Date: 08/26/11). The right groin hematoma/seroma has improved and healed without any drainage.  The patient notes continued resolution of lower extremity symptoms. The patient is able to complete their activities of daily living.   Physical Examination  Filed Vitals:   09/25/11 0936 09/25/11 0937  BP: 112/58 113/63  Pulse: 63 65  Resp: 12 12  Height: 5\' 7"  (1.702 m) 5\' 7"  (1.702 m)  Weight: 133 lb 4.8 oz (60.464 kg) 133 lb 4.8 oz (60.464 kg)  SpO2: 100% 99%    RLE: Incisions are healed, smaller palpable ballotable mass in right groin with palpable pulse, underneath fluid collection, pedal pulses are faintly palpable   Medical Decision Making  Karl Stevenson is a 78 y.o. year old male who presents s/p R iliofemoral endarterectomy complicated by hematoma.  The patient's R groin incision healing appropriately with resolution of pre-operative symptoms. I expect the hematoma to resolve with time.  I will have him come back in one month two week to re-evaluated the incision and hematoma. Thank you for allowing [place] to participate in this patient's care.  Leonides Sake, MD  [facility] [place]  Office: [phone]  Pager: [phone]

## 2011-10-13 ENCOUNTER — Encounter: Payer: Self-pay | Admitting: Cardiology

## 2011-10-17 ENCOUNTER — Telehealth: Payer: Self-pay | Admitting: Physician Assistant

## 2011-10-17 ENCOUNTER — Emergency Department (HOSPITAL_COMMUNITY): Payer: Medicare PPO

## 2011-10-17 ENCOUNTER — Inpatient Hospital Stay (HOSPITAL_COMMUNITY)
Admission: EM | Admit: 2011-10-17 | Discharge: 2011-10-19 | DRG: 309 | Disposition: A | Discharge status: Home / Self Care | Payer: Medicare PPO | Attending: Cardiology | Admitting: Cardiology

## 2011-10-17 ENCOUNTER — Encounter (HOSPITAL_COMMUNITY): Payer: Self-pay | Admitting: *Deleted

## 2011-10-17 DIAGNOSIS — I498 Other specified cardiac arrhythmias: Principal | ICD-10-CM | POA: Diagnosis present

## 2011-10-17 DIAGNOSIS — I701 Atherosclerosis of renal artery: Secondary | ICD-10-CM | POA: Insufficient documentation

## 2011-10-17 DIAGNOSIS — Z7982 Long term (current) use of aspirin: Secondary | ICD-10-CM

## 2011-10-17 DIAGNOSIS — R000 Tachycardia, unspecified: Secondary | ICD-10-CM | POA: Insufficient documentation

## 2011-10-17 DIAGNOSIS — M109 Gout, unspecified: Secondary | ICD-10-CM | POA: Diagnosis present

## 2011-10-17 DIAGNOSIS — Z79899 Other long term (current) drug therapy: Secondary | ICD-10-CM

## 2011-10-17 DIAGNOSIS — E785 Hyperlipidemia, unspecified: Secondary | ICD-10-CM | POA: Diagnosis present

## 2011-10-17 DIAGNOSIS — I2589 Other forms of chronic ischemic heart disease: Secondary | ICD-10-CM | POA: Diagnosis present

## 2011-10-17 DIAGNOSIS — R001 Bradycardia, unspecified: Secondary | ICD-10-CM

## 2011-10-17 DIAGNOSIS — R079 Chest pain, unspecified: Secondary | ICD-10-CM

## 2011-10-17 DIAGNOSIS — I2510 Atherosclerotic heart disease of native coronary artery without angina pectoris: Secondary | ICD-10-CM | POA: Diagnosis present

## 2011-10-17 DIAGNOSIS — Z7902 Long term (current) use of antithrombotics/antiplatelets: Secondary | ICD-10-CM

## 2011-10-17 DIAGNOSIS — Z951 Presence of aortocoronary bypass graft: Secondary | ICD-10-CM

## 2011-10-17 DIAGNOSIS — I739 Peripheral vascular disease, unspecified: Secondary | ICD-10-CM | POA: Diagnosis present

## 2011-10-17 DIAGNOSIS — I255 Ischemic cardiomyopathy: Secondary | ICD-10-CM | POA: Insufficient documentation

## 2011-10-17 DIAGNOSIS — I4510 Unspecified right bundle-branch block: Secondary | ICD-10-CM

## 2011-10-17 DIAGNOSIS — Z8249 Family history of ischemic heart disease and other diseases of the circulatory system: Secondary | ICD-10-CM

## 2011-10-17 DIAGNOSIS — I10 Essential (primary) hypertension: Secondary | ICD-10-CM | POA: Diagnosis present

## 2011-10-17 DIAGNOSIS — I5022 Chronic systolic (congestive) heart failure: Secondary | ICD-10-CM | POA: Diagnosis present

## 2011-10-17 HISTORY — DX: Tachycardia, unspecified: R00.0

## 2011-10-17 HISTORY — DX: Unspecified right bundle-branch block: I45.10

## 2011-10-17 LAB — DIFFERENTIAL
Eosinophils Absolute: 0 10*3/uL (ref 0.0–0.7)
Eosinophils Relative: 1 % (ref 0–5)
Lymphs Abs: 1.3 10*3/uL (ref 0.7–4.0)
Monocytes Absolute: 0.6 10*3/uL (ref 0.1–1.0)
Monocytes Relative: 10 % (ref 3–12)

## 2011-10-17 LAB — COMPREHENSIVE METABOLIC PANEL
Albumin: 3.8 g/dL (ref 3.5–5.2)
BUN: 21 mg/dL (ref 6–23)
Chloride: 103 mEq/L (ref 96–112)
Creatinine, Ser: 1.46 mg/dL — ABNORMAL HIGH (ref 0.50–1.35)
GFR calc Af Amer: 50 mL/min — ABNORMAL LOW (ref >90)
GFR calc non Af Amer: 43 mL/min — ABNORMAL LOW (ref >90)
Glucose, Bld: 120 mg/dL — ABNORMAL HIGH (ref 70–99)
Total Bilirubin: 0.2 mg/dL — ABNORMAL LOW (ref 0.3–1.2)

## 2011-10-17 LAB — CBC
Hemoglobin: 13 g/dL (ref 13.0–17.0)
MCH: 33.7 pg (ref 26.0–34.0)
MCV: 96.9 fL (ref 78.0–100.0)
RBC: 3.86 MIL/uL — ABNORMAL LOW (ref 4.22–5.81)

## 2011-10-17 LAB — URINALYSIS, ROUTINE W REFLEX MICROSCOPIC
Glucose, UA: NEGATIVE mg/dL
Leukocytes, UA: NEGATIVE
Nitrite: NEGATIVE
Protein, ur: NEGATIVE mg/dL
Urobilinogen, UA: 0.2 mg/dL (ref 0.0–1.0)

## 2011-10-17 LAB — CARDIAC PANEL(CRET KIN+CKTOT+MB+TROPI)
CK, MB: 4.1 ng/mL — ABNORMAL HIGH (ref 0.3–4.0)
CK, MB: 3.8 ng/mL (ref 0.3–4.0)
Total CK: 58 U/L (ref 7–232)
Troponin I: <0.3 ng/mL (ref <0.30)
Troponin I: <0.3 ng/mL (ref <0.30)

## 2011-10-17 LAB — POCT I-STAT TROPONIN I: Troponin i, poc: 0.01 ng/mL (ref 0.00–0.08)

## 2011-10-17 MED ORDER — METOPROLOL TARTRATE 1 MG/ML IV SOLN
5.0000 mg | Freq: Once | INTRAVENOUS | Status: AC
  Administered 2011-10-17: 5 mg via INTRAVENOUS
  Filled 2011-10-17: qty 5

## 2011-10-17 MED ORDER — PANTOPRAZOLE SODIUM 40 MG PO TBEC
40.0000 mg | DELAYED_RELEASE_TABLET | Freq: Every day | ORAL | Status: DC
  Administered 2011-10-18 – 2011-10-19 (×2): 40 mg via ORAL
  Filled 2011-10-17 (×2): qty 1

## 2011-10-17 MED ORDER — IPRATROPIUM BROMIDE 0.03 % NA SOLN
2.0000 | Freq: Every day | NASAL | Status: DC
  Filled 2011-10-17: qty 30

## 2011-10-17 MED ORDER — ATORVASTATIN CALCIUM 10 MG PO TABS
10.0000 mg | ORAL_TABLET | Freq: Every day | ORAL | Status: DC
  Filled 2011-10-17: qty 1

## 2011-10-17 MED ORDER — SODIUM CHLORIDE 0.9 % IV BOLUS (SEPSIS)
250.0000 mL | Freq: Once | INTRAVENOUS | Status: AC
  Administered 2011-10-17: 250 mL via INTRAVENOUS

## 2011-10-17 MED ORDER — ASPIRIN 81 MG PO CHEW
324.0000 mg | CHEWABLE_TABLET | Freq: Once | ORAL | Status: AC
  Administered 2011-10-17: 324 mg via ORAL
  Filled 2011-10-17: qty 4

## 2011-10-17 MED ORDER — NITROGLYCERIN IN D5W 200-5 MCG/ML-% IV SOLN
3.0000 ug/min | INTRAVENOUS | Status: DC
Start: 2011-10-17 — End: 2011-10-18

## 2011-10-17 MED ORDER — RANOLAZINE ER 500 MG PO TB12
500.0000 mg | ORAL_TABLET | Freq: Two times a day (BID) | ORAL | Status: DC
  Administered 2011-10-17 – 2011-10-19 (×4): 500 mg via ORAL
  Filled 2011-10-17 (×5): qty 1

## 2011-10-17 MED ORDER — HEPARIN (PORCINE) IN NACL 100-0.45 UNIT/ML-% IJ SOLN
700.0000 [IU]/h | INTRAMUSCULAR | Status: DC
  Administered 2011-10-17: 700 [IU]/h via INTRAVENOUS
  Filled 2011-10-17: qty 250

## 2011-10-17 MED ORDER — CLOPIDOGREL BISULFATE 75 MG PO TABS
75.0000 mg | ORAL_TABLET | Freq: Every day | ORAL | Status: DC
  Administered 2011-10-18 – 2011-10-19 (×2): 75 mg via ORAL
  Filled 2011-10-17 (×2): qty 1

## 2011-10-17 MED ORDER — IPRATROPIUM BROMIDE 0.06 % NA SOLN
2.0000 | Freq: Every day | NASAL | Status: DC
  Filled 2011-10-17 (×2): qty 15

## 2011-10-17 MED ORDER — HEPARIN SODIUM (PORCINE) 1000 UNIT/ML IJ SOLN: 5000.0000 [IU] | Freq: Once | INTRAMUSCULAR | Status: DC

## 2011-10-17 MED ORDER — LORATADINE 10 MG PO TABS
10.0000 mg | ORAL_TABLET | Freq: Every day | ORAL | Status: DC | PRN
  Filled 2011-10-17: qty 1

## 2011-10-17 MED ORDER — IRBESARTAN 75 MG PO TABS
75.0000 mg | ORAL_TABLET | Freq: Every day | ORAL | Status: DC
  Administered 2011-10-18 – 2011-10-19 (×2): 75 mg via ORAL
  Filled 2011-10-17 (×2): qty 1

## 2011-10-17 MED ORDER — HEPARIN SODIUM (PORCINE) 1000 UNIT/ML IJ SOLN
5000.0000 [IU] | Freq: Once | INTRAMUSCULAR | Status: DC
  Filled 2011-10-17: qty 5

## 2011-10-17 MED ORDER — ADULT MULTIVITAMIN W/MINERALS CH
1.0000 | ORAL_TABLET | Freq: Every day | ORAL | Status: DC
  Administered 2011-10-18 – 2011-10-19 (×2): 1 via ORAL
  Filled 2011-10-17 (×2): qty 1

## 2011-10-17 MED ORDER — HEPARIN SODIUM (PORCINE) 5000 UNIT/ML IJ SOLN
INTRAMUSCULAR | Status: AC
  Administered 2011-10-17: 5000 [IU]
  Filled 2011-10-17: qty 1

## 2011-10-17 MED ORDER — METOPROLOL TARTRATE 25 MG PO TABS
25.0000 mg | ORAL_TABLET | Freq: Two times a day (BID) | ORAL | Status: DC
  Filled 2011-10-17 (×2): qty 1

## 2011-10-17 MED ORDER — HEPARIN BOLUS VIA INFUSION: 4000.0000 [IU] | Freq: Once | INTRAVENOUS | Status: DC

## 2011-10-17 MED ORDER — ASPIRIN 81 MG PO CHEW
324.0000 mg | CHEWABLE_TABLET | ORAL | Status: DC
  Filled 2011-10-17: qty 1

## 2011-10-17 MED ORDER — ACETAMINOPHEN 325 MG PO TABS: 650.0000 mg | ORAL_TABLET | ORAL | Status: DC | PRN

## 2011-10-17 MED ORDER — ATORVASTATIN CALCIUM 10 MG PO TABS
10.0000 mg | ORAL_TABLET | Freq: Every day | ORAL | Status: DC
  Administered 2011-10-17 – 2011-10-18 (×2): 10 mg via ORAL
  Filled 2011-10-17 (×3): qty 1

## 2011-10-17 MED ORDER — ONDANSETRON HCL 4 MG/2ML IJ SOLN: 4.0000 mg | Freq: Four times a day (QID) | INTRAMUSCULAR | Status: DC | PRN

## 2011-10-17 MED ORDER — SODIUM CHLORIDE 0.9 % IV SOLN: INTRAVENOUS | Status: DC

## 2011-10-17 MED ORDER — ASPIRIN 81 MG PO TABS: 81.0000 mg | ORAL_TABLET | Freq: Every day | ORAL | Status: DC

## 2011-10-17 MED ORDER — ASPIRIN 300 MG RE SUPP
300.0000 mg | RECTAL | Status: DC
  Filled 2011-10-17: qty 1

## 2011-10-17 MED ORDER — NITROGLYCERIN 0.4 MG SL SUBL
0.4000 mg | SUBLINGUAL_TABLET | SUBLINGUAL | Status: AC | PRN
  Administered 2011-10-17 – 2011-10-19 (×3): 0.4 mg via SUBLINGUAL
  Filled 2011-10-17: qty 25

## 2011-10-17 MED ORDER — ASPIRIN EC 81 MG PO TBEC
81.0000 mg | DELAYED_RELEASE_TABLET | Freq: Every day | ORAL | Status: DC
  Administered 2011-10-18 – 2011-10-19 (×2): 81 mg via ORAL
  Filled 2011-10-17 (×2): qty 1

## 2011-10-17 NOTE — ED Notes (Signed)
Pt resting quietly at the time. Remains on cardiac monitor. Vital signs stable. Family remains at bedside. Denies chest pain. No signs of distress noted.

## 2011-10-17 NOTE — Progress Notes (Signed)
10/17/11 1204  OTHER  CSW Follow Up Status Follow-up required

## 2011-10-17 NOTE — ED Notes (Signed)
Dr. Myrtis Ser at the bedside, pt resting quietly at the time. Denies chest pain. Remains on cardiac monitor. Vital signs stable. Pt to be transported to CCU bed when available. Family at bedside. Will continue to monitor closely.

## 2011-10-17 NOTE — Progress Notes (Signed)
ANTICOAGULATION CONSULT NOTE - Follow Up Consult  Pharmacy Consult for Heparin Indication: chest pain/ACS  Allergies  Allergen Reactions  . Procaine Hcl Other (See Comments)    Cold sweats and headache.     Patient Measurements: Height: 5\' 7"  (170.2 cm) Weight: 130 lb 4.7 oz (59.1 kg) IBW/kg (Calculated) : 66.1  Heparin Dosing Weight: 59.1 kg  Vital Signs: Temp: 98.4 F (36.9 C) (06/08 1931) Temp src: Oral (06/08 1931) BP: 108/60 mmHg (06/08 2100) Pulse Rate: 51  (06/08 2100)  Labs:  Basename 10/17/11 2122 10/17/11 1553 10/17/11 1030  HGB -- -- 13.0  HCT -- -- 37.4*  PLT -- -- 126*  APTT -- -- --  LABPROT -- -- --  INR -- -- --  HEPARINUNFRC 0.41 -- --  CREATININE -- -- 1.46*  CKTOTAL -- 67 --  CKMB -- 4.1* --  TROPONINI -- <0.30 --    Estimated Creatinine Clearance: 32.6 ml/min (by C-G formula based on Cr of 1.46).   Medications:  Infusions:    . sodium chloride    . heparin 700 Units/hr (10/17/11 1900)  . nitroGLYCERIN      Assessment: 84 yom presenting to ED with chest pain to be started on heparin to r/o ACS. Noted patient with low platelets at 126K but this seems to be his baseline. Last plt level was 114 on 08/29/11. No bleeding reported.   Goal of Therapy:  Heparin level 0.3-0.7 units/ml Monitor platelets by anticoagulation protocol: Yes   Plan:  1. Continue heparin at 700 units/hr (7 ml/hr). 2. Follow-up daily HL and CBC.   Link Snuffer, PharmD, BCPS Clinical Pharmacist [phone] 10/17/2011,10:00 PM

## 2011-10-17 NOTE — ED Provider Notes (Signed)
Patient presents with chest pain that radiates down his left arm.  He had been in contact with the cardiology service overnight his head and taking additional doses of blood pressure medication due to elevated blood pressure at home.  Patient comes in now for his persistent pain.  No shortness of breath.  No nausea or vomiting.  Patient does note a past history of problems with his blood pressure as well as tachycardia.  Patient is showing tachycardia on his EKG today but his pressure is now normal.  He does still have his chest pain which we will treat with a nitroglycerin and cardiology be contacted for consultation on this patient.  Medical screening examination/treatment/procedure(s) were conducted as a shared visit with non-physician practitioner(s) and myself.  I personally evaluated the patient during the encounter   Nat Christen, MD 10/17/11 1112

## 2011-10-17 NOTE — ED Notes (Signed)
Pt. Stated, i started having a tightness in my chest last night

## 2011-10-17 NOTE — Progress Notes (Addendum)
ANTICOAGULATION CONSULT NOTE - Initial Consult  Pharmacy Consult for Heparin Indication: chest pain/ACS  Allergies  Allergen Reactions  . Procaine Hcl Other (See Comments)    Cold sweats and headache.     Patient Measurements: Estimated weight ~ 60kg  Vital Signs: Temp: 98.3 F (36.8 C) (06/08 1012) Temp src: Oral (06/08 1012) BP: 134/68 mmHg (06/08 1012) Pulse Rate: 133  (06/08 1012)  Labs:  Basename 10/17/11 1030  HGB 13.0  HCT 37.4*  PLT 126*  APTT --  LABPROT --  INR --  HEPARINUNFRC --  CREATININE 1.46*  CKTOTAL --  CKMB --  TROPONINI --    The CrCl is unknown because both a height and weight (above a minimum accepted value) are required for this calculation.   Medical History: Past Medical History  Diagnosis Date  . Hyperlipidemia   . Hypertension   . Carotid artery occlusion     s/p L CEA in 2010  . Crohn's disease     colon resection  . Bradycardia     Intolerant to beta blockers  . Chronic systolic heart failure     echo 7/12: EF 35-40%, anteroseptal hypokinesis, mild MR.  . Ischemic cardiomyopathy     EF 34% (Lexiscan 06/2011), 35-40% (echo 11/2010)  . Coronary artery disease     CABG 1987, redo 2008; Last LHC 1/10: SVG-OM 1/OM 2 patent, SVG-LAD patent, SVG-OM 3 Patent  . Peripheral vascular disease     Carotid dz s/p L CEA, RAS, AAA, LE dz  . Renal artery stenosis     Dopplers 12/2010 - 1-59% R ostial renal artery stenosis, 2 L renal arteries both widely patent  . Gouty arthritis   . AAA (abdominal aortic aneurysm)     Ectatic abdominal aorta with fusiform dilitation 3.4x3.4 and moderate thrombus burden 12/2010   Medications:  Scheduled:    . heparin  5,000 Units Intravenous Once  . metoprolol  5 mg Intravenous Once  . sodium chloride  250 mL Intravenous Once  . DISCONTD: heparin  5,000 Units Intravenous Once   Infusions:   PRN: nitroGLYCERIN  Assessment: 84 yom presenting to ED with chest pain to be started on heparin to r/o ACS.  Noted patient with low platelets at 126K but this seems to be his baseline. Last plt level was 114 on 08/29/11.   Goal of Therapy:  Heparin level 0.3-0.7 units/ml Monitor platelets by anticoagulation protocol: Yes   Plan:  1. Heparin bolus of 4000 units then heparin infusion of 700 units/hr (55ml/hr) 2. Obtain heparin level in 8 hours, timed collect (~2000 tonight) 3. Daily HL and CBC  Addendum: noted heparin 5000 units given in ED, pt to begin heparin 700 units/hr and f/u with 8 hour heparin level  Thank you,  Brett Fairy, PharmD Pager: [phone]  10/17/2011 11:52 AM

## 2011-10-17 NOTE — Progress Notes (Signed)
10/17/11 1200  Discharge Planning  Type of Residence Private residence  Living Arrangements Spouse/significant other  Home Care Services No  Support Systems Spouse/significant other  Do you have any problems obtaining your medications? No  Family/patient expects to be discharged to: Unsure  Once you are discharged, how will you get to your follow-up appointment? Family  Case Management Consult Needed No  Social Work Consult Needed Yes (Comment)     Consult unit based LCSW if psychosocial needs arise and/or if disposition needs are identified.   Dionne Milo MSW [facility] Emergency Dept. Weekend/Social Worker [phone]

## 2011-10-17 NOTE — ED Provider Notes (Signed)
History     CSN: [id number]  Arrival date & time 10/17/11  7829   First MD Initiated Contact with Patient 10/17/11 1018      Chief Complaint  Patient presents with  . Chest Pain    (Consider location/radiation/quality/duration/timing/severity/associated sxs/prior treatment) Patient is a 78 y.o. male presenting with chest pain. The history is provided by the patient, a relative and the spouse. No language interpreter was used.  Chest Pain The chest pain began yesterday. Chest pain occurs intermittently. The chest pain is unchanged. The quality of the pain is described as dull and aching. The pain radiates to the left arm. Primary symptoms include palpitations. Pertinent negatives for primary symptoms include no fever, no shortness of breath, no cough, no nausea, no vomiting and no dizziness.  The palpitations did not occur with dizziness or shortness of breath.   Pertinent negatives for associated symptoms include no diaphoresis and no near-syncope. Treatments tried: took x-tra diovan per Dr Danella Penton with [facility].  His past medical history is significant for CAD.   Patient is here c/o chest pain since yesterday that radiates down LUE.  LUE pain has been a constant  78 year old male complaining of midsternal chest pain and left upper arm pain. States that through the night he was having chest pain/hypertension and he called the [place]/Dr. [facility]  cardiology and they told him to take an extra Diovan. He then called them back at 1 AM. HR was 90 at that point and sbp was 180's.  When he awoke at 5:30 his heart rate was 132 and midsternal chest pain was coming and going and worse. States that the left arm pain has been constant and dull all my on about a 4/10. States that the midsternal chest pain at worst has been 8-9/10 and presently it's a 4. Heart rate presently is 132. Blood pressure is 134/68. [facility] cardiology paged. . Past Medical History  Diagnosis Date  . Hyperlipidemia   . Hypertension     . Carotid artery occlusion     s/p L CEA in 2010  . Crohn's disease     colon resection  . Bradycardia     Intolerant to beta blockers  . Chronic systolic heart failure     echo 7/12: EF 35-40%, anteroseptal hypokinesis, mild MR.  . Ischemic cardiomyopathy     EF 34% (Lexiscan 06/2011), 35-40% (echo 11/2010)  . Coronary artery disease     CABG 1987, redo 2008; Last LHC 1/10: SVG-OM 1/OM 2 patent, SVG-LAD patent, SVG-OM 3 Patent  . Peripheral vascular disease     Carotid dz s/p L CEA, RAS, AAA, LE dz  . Renal artery stenosis     Dopplers 12/2010 - 1-59% R ostial renal artery stenosis, 2 L renal arteries both widely patent  . Gouty arthritis   . AAA (abdominal aortic aneurysm)     Ectatic abdominal aorta with fusiform dilitation 3.4x3.4 and moderate thrombus burden 12/2010    Past Surgical History  Procedure Date  . Carotid endarterectomy 2010    left  . Colon resection   . Tonsillectomy     as a child  . Pr vein bypass graft,aorto-fem-pop 10/1985  . Coronary artery bypass graft 10/31/85 & 08/19/06  . Vein bypass surgery     LEFT   06/2011  . Endarterectomy 08/26/2011    Procedure: ENDARTERECTOMY ILIAC;  Surgeon: [name], MD;  Location: [facility] OR;  Service: Vascular;  Laterality: Right;  Right Femoral Artery Endarterectomy with vascu guard patch  angioplasty & intraoperative arteriogram.    Family History  Problem Relation Age of Onset  . Heart disease Father   . Cancer Mother   . Anesthesia problems Neg Hx     History  Substance Use Topics  . Smoking status: Never Smoker   . Smokeless tobacco: Not on file  . Alcohol Use: No      Review of Systems  Constitutional: Negative.  Negative for fever and diaphoresis.  HENT: Negative.   Eyes: Negative.   Respiratory: Negative.  Negative for cough and shortness of breath.   Cardiovascular: Positive for chest pain and palpitations. Negative for near-syncope.  Gastrointestinal: Negative.  Negative for nausea and vomiting.   Neurological: Negative.  Negative for dizziness.  Psychiatric/Behavioral: Negative.   All other systems reviewed and are negative.    Allergies  Procaine hcl  Home Medications   Current Outpatient Rx  Name Route Sig Dispense Refill  . ASPIRIN 81 MG PO TABS Oral Take 81 mg by mouth daily.     . ATORVASTATIN CALCIUM 10 MG PO TABS Oral Take 10 mg by mouth daily.      [name] CLOPIDOGREL BISULFATE 75 MG PO TABS Oral Take 75 mg by mouth daily.    [name] COLCRYS 0.6 MG PO TABS Oral Take 0.6 mg by mouth daily as needed. For gout    . FEXOFENADINE HCL 180 MG PO TABS Oral Take 180 mg by mouth daily as needed. allergies    . IPRATROPIUM BROMIDE 0.03 % NA SOLN Nasal Place 2 sprays into the nose daily.     . ISOSORBIDE MONONITRATE ER 60 MG PO TB24 Oral Take 60 mg by mouth daily.    . ADULT MULTIVITAMIN W/MINERALS CH Oral Take 1 tablet by mouth daily.    [name] NITROGLYCERIN 0.4 MG SL SUBL Sublingual Place 0.4 mg under the tongue every 5 (five) minutes as needed. For chest pain    . PANTOPRAZOLE SODIUM 40 MG PO TBEC Oral Take 40 mg by mouth daily.    [name] RANOLAZINE ER 500 MG PO TB12 Oral Take 500 mg by mouth 2 (two) times daily.    [name] VALSARTAN 80 MG PO TABS Oral Take 1 tablet (80 mg total) by mouth daily. 90 tablet 3    BP 134/68  Pulse 133  Temp(Src) 98.3 F (36.8 C) (Oral)  Resp 20  SpO2 98%  Physical Exam  Nursing note and vitals reviewed. Constitutional: He is oriented to person, place, and time. He appears well-developed and well-nourished.  HENT:  Head: Normocephalic.  Eyes: Conjunctivae and EOM are normal. Pupils are equal, round, and reactive to light.  Neck: Normal range of motion. Neck supple.  Cardiovascular: Normal heart sounds and normal pulses.  Tachycardia present.   Pulmonary/Chest: Effort normal and breath sounds normal. No respiratory distress.  Abdominal: Soft.  Musculoskeletal: Normal range of motion.  Neurological: He is alert and oriented to person, place, and time.  Skin: Skin  is warm and dry.  Psychiatric: He has a normal mood and affect.    ED Course  Procedures (including critical care time)   Labs Reviewed  CBC  DIFFERENTIAL  COMPREHENSIVE METABOLIC PANEL  URINALYSIS, ROUTINE W REFLEX MICROSCOPIC   No results found.   No diagnosis found.    MDM   Date: 10/17/2011  Rate: 69  Rhythm: normal sinus rhythm  QRS Axis: normal  Intervals: normal  ST/T Wave abnormalities: normal  Conduction Disutrbances:first-degree A-V block   Narrative Interpretation: bigeminy  Old EKG Reviewed: unchanged  Chest pain x 24 hours intermittant midsternal  And constant to LUE.  ST 130's when he awoke this am.  95-130 in the ER today ST.  Midsternal CP.  Dr. Myrtis Ser notified and will see pt in ER.  First trop -.  Chest x-ray unremarkable. Chest pain better after  Sl ntg. EKG unchanged.  Labs Reviewed  CBC - Abnormal; Notable for the following:    RBC 3.86 (*)    HCT 37.4 (*)    Platelets 126 (*)    All other components within normal limits  COMPREHENSIVE METABOLIC PANEL - Abnormal; Notable for the following:    Glucose, Bld 120 (*)    Creatinine, Ser 1.46 (*)    Total Bilirubin 0.2 (*)    GFR calc non Af Amer 43 (*)    GFR calc Af Amer 50 (*)    All other components within normal limits  DIFFERENTIAL  POCT I-STAT TROPONIN I  URINALYSIS, ROUTINE W REFLEX MICROSCOPIC  HEPARIN LEVEL (UNFRACTIONATED)          Remi Haggard, NP 10/18/11 1325

## 2011-10-17 NOTE — H&P (Signed)
History and physical  Patient ID: Karl Stevenson MRN: [id number] DOB/AGE: [birth year] 78 y.o. Admit date: 10/17/2011  Primary Care Physician:PATERSON,DANIEL G, MD, MD Primary Cardiologist    Calton Dach,   [place]  HPI:  The patient comes to the emergency room today with chest discomfort that has been with him since yesterday. He's also had some arm pain. He has known coronary disease it is significant. In the past when he has had ischemic pain he has had chest pain and arm pain. Frequently he has had nausea with it. He does not have any nausea at this time. The patient underwent CABG in 1987 and redo in 2008. His last cath was in January, 2010. His vein grafts were patent. He has significant peripheral vascular disease. Recently he has had procedures by Dr. Imogene Burn and has been stable.He saw Dr. Excell Seltzer last in the office August 21, 2011. It was mentioned that he has moderate subclavian stenosis. His ejection fraction by history as 40%. He has a small infrarenal abdominal aortic aneurysm. He had a nuclear scan in February, 2013. There was a fixed defect in the anterior wall compatible with prior infarction. There was no significant ischemia. On the day of the visit he was stable.  Yesterday he began having chest and arm discomfort. He was able to sleep last night but he had more symptoms this morning. He then came to the emergency room. He was first given IV fluids because he had taken some additional medications last night. He has not been hypotensive. He was then given nitroglycerin. With this over time his discomfort in his chest and arm began to decrease. I then gave him 5 mg of IV Lopressor. His sinus tachycardia of 135 decreased into the range of 95. His blood pressure remained stable with this. He has an old underlying right bundle branch block.  Review of systems:  Patient denies fever, chills, headache, sweats, rash, change in vision, change in hearing, cough, nausea vomiting, urinary symptoms.  All other systems are reviewed and are negative.   Past Medical History  Diagnosis Date  . Hyperlipidemia   . Hypertension   . Carotid artery occlusion     s/p L CEA in 2010  . Crohn's disease     colon resection  . Bradycardia     Intolerant to beta blockers  . Chronic systolic heart failure     echo 7/12: EF 35-40%, anteroseptal hypokinesis, mild MR.  . Ischemic cardiomyopathy     EF 34% (Lexiscan 06/2011), 35-40% (echo 11/2010)  . Coronary artery disease     CABG 1987, redo 2008; Last LHC 1/10: SVG-OM 1/OM 2 patent, SVG-LAD patent, SVG-OM 3 Patent  . Peripheral vascular disease     Carotid dz s/p L CEA, RAS, AAA, LE dz  . Renal artery stenosis     Dopplers 12/2010 - 1-59% R ostial renal artery stenosis, 2 L renal arteries both widely patent  . Gouty arthritis   . AAA (abdominal aortic aneurysm)     Ectatic abdominal aorta with fusiform dilitation 3.4x3.4 and moderate thrombus burden 12/2010    Family History  Problem Relation Age of Onset  . Heart disease Father   . Cancer Mother   . Anesthesia problems Neg Hx     History   Social History  . Marital Status: Married    Spouse Name: N/A    Number of Children: N/A  . Years of Education: N/A   Occupational History  . Not on file.  Social History Main Topics  . Smoking status: Never Smoker   . Smokeless tobacco: Not on file  . Alcohol Use: No  . Drug Use: No  . Sexually Active: Yes   Other Topics Concern  . Not on file   Social History Narrative  . No narrative on file    Past Surgical History  Procedure Date  . Carotid endarterectomy 2010    left  . Colon resection   . Tonsillectomy     as a child  . Pr vein bypass graft,aorto-fem-pop 10/1985  . Coronary artery bypass graft 10/31/85 & 08/19/06  . Vein bypass surgery     LEFT   06/2011  . Endarterectomy 08/26/2011    Procedure: ENDARTERECTOMY ILIAC;  Surgeon: [name], MD;  Location: [facility] OR;  Service: Vascular;  Laterality: Right;  Right Femoral Artery  Endarterectomy with vascu guard patch angioplasty & intraoperative arteriogram.      (Not in a hospital admission)  Physical Exam: Blood pressure 132/82, pulse 97, temperature 98.7 F (37.1 C), temperature source Oral, resp. rate 18, SpO2 99.00%.  Patient is oriented to person time and place. Affect is normal. Family is in the room. There is no jugulovenous distention. Lungs are clear. Respiratory effort is nonlabored. Cardiac exam reveals S1 and S2. There no clicks or significant murmurs. The abdomen is soft. His vascular surgery sites in the groin are healing nicely. He has no significant peripheral edema. There no musculoskeletal deformities. There are no skin rashes.   Labs:   Lab Results  Component Value Date   WBC 6.2 10/17/2011   HGB 13.0 10/17/2011   HCT 37.4* 10/17/2011   MCV 96.9 10/17/2011   PLT 126* 10/17/2011    Lab 10/17/11 1030  NA 139  K 4.4  CL 103  CO2 24  BUN 21  CREATININE 1.46*  CALCIUM 9.5  PROT 6.6  BILITOT 0.2*  ALKPHOS 78  ALT 21  AST 26  GLUCOSE 120*   Lab Results  Component Value Date   CKTOTAL 92 08/27/2011   CKMB 3.4 08/27/2011   TROPONINI <0.30 08/27/2011    Lab Results  Component Value Date   CHOL 104 11/24/2010   CHOL 107 11/09/2010   Lab Results  Component Value Date   HDL 41 11/24/2010   HDL 47 05/16/1094   Lab Results  Component Value Date   LDLCALC 49 11/24/2010   LDLCALC 47 11/09/2010   Lab Results  Component Value Date   TRIG 70 11/24/2010   TRIG 67 11/09/2010   Lab Results  Component Value Date   CHOLHDL 2.5 11/24/2010   CHOLHDL 2.3 11/09/2010   No results found for this basename: LDLDIRECT      Radiology: Dg Chest Portable 1 View  10/17/2011  *RADIOLOGY REPORT*  Clinical Data: Chest pain and tightness.  PORTABLE CHEST - 1 VIEW  Comparison: 11/24/2010  Findings: Single view of the chest demonstrates multiple old left rib fractures.  Lungs are clear without focal airspace disease. Previous median sternotomy.  Heart size is within normal  limits. Trachea is midline.  Again noted are atherosclerotic calcifications at the aortic arch.  IMPRESSION: No acute chest findings.  Original Report Authenticated By: Richarda Overlie, M.D.   EKG:  EKG is done today and reviewed by me. I have also reviewed multiple old EKGs. He has a right bundle-branch block. It is difficult in this setting to see his P waves. However the QRS looks very similar to prior tracings. There is no  significant new ST change. With IV Lopressor his rate slows and I think P waves can be seen. This appears to be sinus rhythm.  ASSESSMENT AND PLAN:    CAD (coronary artery disease)     Patient has known significant coronary disease. I believe that his current chest discomfort and arm discomfort is ischemic. He has sinus tachycardia. I'm not completely sure if he's having chest discomfort because of sinus tachycardia or that the chest discomfort is the primary issue. Regardless we are controlling his rate. In addition he is receiving nitroglycerin and IV heparin. He is already feeling better.   PVD (peripheral vascular disease      He seems to be stable from his most recent peripheral vascular procedures. These were be followed.   Ischemic cardiomyopathy    It is my understanding that he has an ejection fraction of 40% at rest.      RBBB    Old   Chest pain    I do believe that the patient's chest discomfort at this time probably is ischemic.   Sinus tachycardia   The rhythm appears to be sinus tachycardia. He responded to beta blockade. It is my understanding that the patient has not tolerated beta blockers in the past but I do not have all the specifics Y. At this point I will definitely continue him on a beta blocker.   Willa Rough 10/17/2011, 11:47 AM

## 2011-10-17 NOTE — Telephone Encounter (Signed)
Patient called to report chest pain, left arm pain. He reports calling last night for elevated BP which has since come down but his P is 145 and he still has residual L arm pain. CP is better but still comes and goes. I advised proceeding to ER for evaluation - instructed not to drive himself. He verbalized understanding and gratitude and will proceed. Ader Fritze PA-C

## 2011-10-18 DIAGNOSIS — I4510 Unspecified right bundle-branch block: Secondary | ICD-10-CM

## 2011-10-18 DIAGNOSIS — I498 Other specified cardiac arrhythmias: Principal | ICD-10-CM

## 2011-10-18 DIAGNOSIS — R000 Tachycardia, unspecified: Secondary | ICD-10-CM

## 2011-10-18 DIAGNOSIS — R001 Bradycardia, unspecified: Secondary | ICD-10-CM

## 2011-10-18 LAB — LIPID PANEL
HDL: 36 mg/dL — ABNORMAL LOW (ref >39)
LDL Cholesterol: 34 mg/dL (ref 0–99)
Total CHOL/HDL Ratio: 2.4 RATIO
Triglycerides: 83 mg/dL (ref <150)
VLDL: 17 mg/dL (ref 0–40)

## 2011-10-18 LAB — CBC
HCT: 33.6 % — ABNORMAL LOW (ref 39.0–52.0)
Hemoglobin: 11.6 g/dL — ABNORMAL LOW (ref 13.0–17.0)
MCHC: 34.5 g/dL (ref 30.0–36.0)
RBC: 3.41 MIL/uL — ABNORMAL LOW (ref 4.22–5.81)

## 2011-10-18 LAB — CARDIAC PANEL(CRET KIN+CKTOT+MB+TROPI)
CK, MB: 3.3 ng/mL (ref 0.3–4.0)
Total CK: 46 U/L (ref 7–232)

## 2011-10-18 LAB — BASIC METABOLIC PANEL
BUN: 23 mg/dL (ref 6–23)
CO2: 27 mEq/L (ref 19–32)
GFR calc non Af Amer: 42 mL/min — ABNORMAL LOW (ref >90)
Glucose, Bld: 94 mg/dL (ref 70–99)
Potassium: 4.3 mEq/L (ref 3.5–5.1)
Sodium: 142 mEq/L (ref 135–145)

## 2011-10-18 LAB — HEPARIN LEVEL (UNFRACTIONATED): Heparin Unfractionated: 0.57 IU/mL (ref 0.30–0.70)

## 2011-10-18 LAB — TSH: TSH: 1.93 u[IU]/mL (ref 0.350–4.500)

## 2011-10-18 MED ORDER — METOPROLOL SUCCINATE 12.5 MG HALF TABLET
12.5000 mg | ORAL_TABLET | Freq: Every day | ORAL | Status: DC
  Administered 2011-10-18 – 2011-10-19 (×2): 12.5 mg via ORAL
  Filled 2011-10-18 (×2): qty 1

## 2011-10-18 NOTE — Progress Notes (Signed)
Patient: Karl Stevenson Date of Encounter: 10/18/2011, 7:45 AM Admit date: 10/17/2011     Subjective  Patient called answering service yesterday reporting CP, L arm pain with elevated BP of 200 systolic & P 145. Had tachycardia (?sinus tach) in ER 120s-130s that responded to 1 dose of 5mg  IV Lopressor. Has reported hx of bradycardia with BB, but since he improved in ED yesterday with IV-BB, he was written for PO metoprolol - however, due to HR he never received it and HR continues to be in the 50s. BP is stable. Is not on any other AV nodal blocking agents.  He denies any CP or SOB this morning. Denies both with ambulation at home prior to admission. Has had 3 occasions in the past few months of markedly elevated BP and associated CP.  EKG shows sinus bradycardia 1st degree AVB 49bpm, RBBB, ?LAFB as well.   Objective   Telemetry: sinus bradycardia HR 40s-50s Physical Exam: Filed Vitals:   10/18/11 0600  BP: 111/53  Pulse: 45  Temp: 97.9  Resp: 22   General: Well developed, well nourished WM in no acute distress. Laying flat in bed Head: Normocephalic, atraumatic, sclera non-icteric, no xanthomas, nares are without discharge.  Neck: No masses. JVD not elevated. Lungs: Clear bilaterally to auscultation without wheezes, rales, or rhonchi. Breathing is unlabored. Heart: RRR S1 S2 with soft late SEM murmur. No rub or gallops.  Abdomen: Soft, non-tender, non-distended with normoactive bowel sounds. No hepatomegaly. No rebound/guarding. No obvious abdominal masses. Msk:  Strength and tone appear normal for age. Extremities: No clubbing or cyanosis. No edema. Has R groin incision well healing with slight quarter sized soft tissue swelling without bruit, erythema or hematoma. Distal pedal pulses are 2+ and equal bilaterally. Neuro: Alert and oriented X 3. Moves all extremities spontaneously. Psych:  Responds to questions appropriately with a normal affect.   Echo July 2012Study Conclusions -  Left ventricle: Systolic function was moderately reduced. The   estimated ejection fraction was in the range of 35% to 40%.   Akinesis of the mid-distal anteroseptal myocardium. - Mitral valve: Mild regurgitation   Intake/Output Summary (Last 24 hours) at 10/18/11 0745 Last data filed at 10/18/11 0600  Gross per 24 hour  Intake    454 ml  Output    200 ml  Net    254 ml    Inpatient Medications:    . aspirin  324 mg Oral NOW   Or  . aspirin  300 mg Rectal NOW  . aspirin  324 mg Oral Once  . aspirin EC  81 mg Oral Daily  . atorvastatin  10 mg Oral QHS  . clopidogrel  75 mg Oral Q breakfast  . heparin      . ipratropium  2 spray Nasal Daily  . irbesartan  75 mg Oral Daily  . metoprolol  5 mg Intravenous Once  . multivitamin with minerals  1 tablet Oral Daily  . pantoprazole  40 mg Oral Daily  . ranolazine  500 mg Oral BID  . sodium chloride  250 mL Intravenous Once  . DISCONTD: aspirin  81 mg Oral Daily  . DISCONTD: atorvastatin  10 mg Oral Daily  . DISCONTD: heparin  4,000 Units Intravenous Once  . DISCONTD: heparin  5,000 Units Intravenous Once  . DISCONTD: heparin  5,000 Units Intravenous Once  . DISCONTD: ipratropium  2 spray Nasal Daily  . DISCONTD: metoprolol tartrate  25 mg Oral BID    Labs:  Basename 10/18/11 0535 10/17/11 1030  NA 142 139  K 4.3 4.4  CL 109 103  CO2 27 24  GLUCOSE 94 120*  BUN 23 21  CREATININE 1.49* 1.46*  CALCIUM 8.9 9.5  MG -- --  PHOS -- --    Basename 10/17/11 1030  AST 26  ALT 21  ALKPHOS 78  BILITOT 0.2*  PROT 6.6  ALBUMIN 3.8   Basename 10/18/11 0535 10/17/11 1030  WBC 4.5 6.2  NEUTROABS -- 4.3  HGB 11.6* 13.0  HCT 33.6* 37.4*  MCV 98.5 96.9  PLT 100* 126*    Basename 10/17/11 2117 10/17/11 1553  CKTOTAL 58 67  CKMB 3.8 4.1*  TROPONINI <0.30 <0.30    Basename 10/18/11 0535  CHOL 87  HDL 36*  LDLCALC 34  TRIG 83  CHOLHDL 2.4    Basename 10/17/11 1553  TSH 1.930  T4TOTAL --  T3FREE --  THYROIDAB --    Radiology/Studies:  1. Chest Portable 1 View 10/17/2011  *RADIOLOGY REPORT*  Clinical Data: Chest pain and tightness.  PORTABLE CHEST - 1 VIEW  Comparison: 11/24/2010  Findings: Single view of the chest demonstrates multiple old left rib fractures.  Lungs are clear without focal airspace disease. Previous median sternotomy.  Heart size is within normal limits. Trachea is midline.  Again noted are atherosclerotic calcifications at the aortic arch.  IMPRESSION: No acute chest findings.  Original Report Authenticated By: Richarda Overlie, M.D.     Assessment and Plan   1. Chest pain with hx of CAD s/p CABG/redo - occurred in setting of high BP, elevated HR. Will ask Dr. Johney Frame to review admission EKG. It appears to be sinus tach but I can't be sure. ER strips are unavailable. He had a similar appearing EKG in 08/2011. Had recent nonischemic Myoview 06/2011. Troponins are neg thus far, had one MB elevated to 4.1. EKG shows generalized flattening from prior but otherwise grossly unchanged from 06/2011. Continue ASA, statin, Plavix. Ranexa is on board with prolonged QTC at 513 but has RBBB. Will discuss plan with MD. Consider transfer to tele.  2. Bradycardia - EKG shows 1st degree AVB, RBBB, ?LAFB. IV Lopressor could've contributed to his bradycardia but I would not have necessarily expected it to continue through this morning.    3. ICM - patient does not appear volume overloaded. Weight is actually down slightly from prior. Continue ARB. No BB secondary to #2. Consider capping off IVF.  4. Likely CKD stage 3 - baseline Cr appears 1.3-1.6.  5. 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  6. Mild anemia/thrombocytopenia - Plts 90s-100s before. Hgb previously ranged 10.1-12.6 which is range that Hgb falls in today. However, given admit Hgb slightly higher will trend.  Signed, Ronie Spies PA-C   I have seen, examined the patient, and  reviewed the above assessment and plan.  EKG from yesterday reveals tachycardia.  Though this could be sinus tach with first degree AV block, I think that this is more likely SVT (either atrial tachycardia or AVNRT).  As his CP resolved upon termination, I would not anticipate that further ischemic workup is presently necessary.  He has sinus bradycardia at rest but is asymptomatic.  I will therefore add toprol in a low dose to hopefully minimize tachycardia.  Given his advanced age, I think that medical therapy is the most prudent option at this time. He will transfer him to telemetry for  close overnight observation. Dr Excell Seltzer knows the patient quite well and will see him tomorrow.  Co Sign: Hillis Range, MD 10/18/2011 11:48 AM

## 2011-10-19 ENCOUNTER — Encounter (HOSPITAL_COMMUNITY): Payer: Self-pay | Admitting: Nurse Practitioner

## 2011-10-19 LAB — BASIC METABOLIC PANEL
BUN: 22 mg/dL (ref 6–23)
Chloride: 106 mEq/L (ref 96–112)
GFR calc Af Amer: 58 mL/min — ABNORMAL LOW (ref >90)
GFR calc non Af Amer: 50 mL/min — ABNORMAL LOW (ref >90)
Potassium: 3.9 mEq/L (ref 3.5–5.1)
Sodium: 141 mEq/L (ref 135–145)

## 2011-10-19 LAB — CBC
MCHC: 34.3 g/dL (ref 30.0–36.0)
Platelets: 105 10*3/uL — ABNORMAL LOW (ref 150–400)
RDW: 13.2 % (ref 11.5–15.5)
WBC: 4.7 10*3/uL (ref 4.0–10.5)

## 2011-10-19 MED ORDER — METOPROLOL SUCCINATE ER 25 MG PO TB24: 12.5000 mg | ORAL_TABLET | Freq: Every day | ORAL | Status: DC

## 2011-10-19 NOTE — Discharge Summary (Signed)
Agree as outlined. Please see my progress note this same date.  Karl Stevenson 10/19/2011 10:45 PM

## 2011-10-19 NOTE — Progress Notes (Signed)
.      Subjective:  No chest pain, dyspnea, or palpitations.  Objective:  Vital Signs in the last 24 hours: Temp:  [96.5 F (35.8 C)-97.9 F (36.6 C)] 96.5 F (35.8 C) (06/10 0901) Pulse Rate:  [49-100] 59  (06/10 1048) Resp:  [17-19] 18  (06/10 0901) BP: (110-179)/(57-94) 116/60 mmHg (06/10 0923) SpO2:  [95 %-100 %] 95 % (06/10 0901) Weight:  [58.015 kg (127 lb 14.4 oz)] 58.015 kg (127 lb 14.4 oz) (06/10 0430)  Intake/Output from previous day: 06/09 0701 - 06/10 0700 In: 76.4 [I.V.:76.4] Out: 250 [Urine:250]  Physical Exam: Pt is alert and oriented, pleasant elderly male in NAD HEENT: normal Neck: JVP - normal, carotids 2+= without bruits Lungs: CTA bilaterally CV: RRR without murmur or gallop Abd: soft, NT, Positive BS, no hepatomegaly Ext: no C/C/E, distal pulses intact and equal Skin: warm/dry no rash   Lab Results:  Basename 10/19/11 0555 10/18/11 0535  WBC 4.7 4.5  HGB 12.4* 11.6*  PLT 105* 100*    Basename 10/19/11 0555 10/18/11 0535  NA 141 142  K 3.9 4.3  CL 106 109  CO2 26 27  GLUCOSE 92 94  BUN 22 23  CREATININE 1.29 1.49*    Basename 10/18/11 0914 10/17/11 2117  TROPONINI <0.30 <0.30    Tele: sinus rhythm with episodes of atrial tachycardia  Assessment/Plan:  1. Chest pain, no objective evidence of ischemia. Recent Myoview negative. Maybe rhythm related - cont low-dose beta blocker at discharge. Appears stable today.  2. Paroxysmal atrial tach - treat with beta blocker. Have to use low dose because of resting bradycardia.  Other problems are stable, plan discharge this afternoon and follow-up with Norma Fredrickson in the office in 2 weeks.  [name], M.D. 10/19/2011, 12:47 PM

## 2011-10-19 NOTE — Progress Notes (Signed)
Pt complained of pain in the left upper back, left arm, and numbness in the left leg.  Neuro assessment was normal.  3 nitroglycerin tablets were given at 5 minute intervals.  Pt stated relief after 3rd tablet.  EKG was also performed, no new changes were noted on EKG.  Ward Givens NP was notified of findings and results.

## 2011-10-19 NOTE — Care Management Note (Signed)
    Page 1 of 1   10/19/2011     12:31:01 PM   CARE MANAGEMENT NOTE 10/19/2011  Patient:  Karl Stevenson, Karl Stevenson   Account Number:  [id number]  Date Initiated:  10/19/2011  Documentation initiated by:  Luisfernando Brightwell  Subjective/Objective Assessment:   PT ADM WITH CHEST PAIN ON 10/17/11 FROM ED.  PTA, PT INDEPENDENT, LIVES WITH SPOUSE.     Action/Plan:   MET WITH PT TO DISCUSS DC NEEDS.  PT STATES WIFE TO PROVIDE CARE AT DC.  DENIES ANY NEEDS PRESENTLY.  WILL FOLLOW.   Anticipated DC Date:  10/19/2011   Anticipated DC Plan:  HOME/SELF CARE      DC Planning Services  CM consult      Choice offered to / List presented to:             Status of service:  Completed, signed off Medicare Important Message given?   (If response is "NO", the following Medicare IM given date fields will be blank) Date Medicare IM given:   Date Additional Medicare IM given:    Discharge Disposition:  HOME/SELF CARE  Per UR Regulation:    If discussed at Long Length of Stay Meetings, dates discussed:    Comments:

## 2011-10-19 NOTE — Progress Notes (Signed)
UR completed.    Dvon Jiles Wise Shuna Tabor, RN, BSN Phone #[phone]  

## 2011-10-19 NOTE — Discharge Summary (Signed)
Patient ID: Karl Stevenson,  MRN: [id number], DOB/AGE: 10/11/1926 78 y.o.  Admit date: 10/17/2011 Discharge date: 10/19/2011  Primary Care Provider: Garlan Fillers, MD Primary Cardiologist: Judie Petit. Excell Seltzer, MD  Discharge Diagnoses Principal Problem:  *Tachycardia  **Atrial Tach vs. AVNRT Active Problems:  CAD (coronary artery disease)  Chest pain  Ischemic cardiomyopathy  PVD (peripheral vascular disease)  Atherosclerosis of renal artery  RBBB  Bradycardia   Allergies Allergies  Allergen Reactions  . Procaine Hcl Other (See Comments)    Cold sweats and headache.     Procedures  None  History of Present Illness  78 y/o male with prior h/o CAD and PVD.  He recently had a non-ischemic myoview in 06/2011.  He was in his USOH until the day prior to admission when he began noting chest and arm discomfort.  This had eased off enough by nighttime that he was able to sleep ok, however he developed recurrent discomfort on 6/8 associated with hypertension and tachycardia noted on his home BP cuff.  He called into our answering service and was advised to present to the ED.  In the ED, pt was noted to be tachycardic in the 130's.  He was treated with IV lopressor with reduced rate and resolution of chest pain.  It was initially felt that rhythm represented sinus tachycardia.  He was admitted for further evaluation.  Hospital Course  Pt r/o for MI.  He has had no further chest pain.  ECG has been reviewed by electrophysiology and it's felt that his tachycardia more likely represents atrial tachycardia vs. AVNRT.  Though pt has prior intolerance to beta blockers (bradycardia), low dose Toprol XL has been started and tolerated up to this point.  Pt has had neither additional tachycardia nor significant bradycardia.  In the absence of objective evidence of ischemia, it was felt that chest pain was secondary to tachycardia and we do not plan further ischemic evaluation at this time.  He will be  discharged home today in good condition.  Discharge Vitals Blood pressure 116/60, pulse 59, temperature 96.5 F (35.8 C), temperature source Oral, resp. rate 18, height 5\' 7"  (1.702 m), weight 127 lb 14.4 oz (58.015 kg), SpO2 95.00%.  Filed Weights   10/17/11 2100 10/18/11 0600 10/19/11 0430  Weight: 130 lb 4.7 oz (59.1 kg) 129 lb 6.6 oz (58.7 kg) 127 lb 14.4 oz (58.015 kg)   Labs  CBC  Basename 10/19/11 0555 10/18/11 0535 10/17/11 1030  WBC 4.7 4.5 --  NEUTROABS -- -- 4.3  HGB 12.4* 11.6* --  HCT 36.2* 33.6* --  MCV 96.8 98.5 --  PLT 105* 100* --   Basic Metabolic Panel  Basename 10/19/11 0555 10/18/11 0535  NA 141 142  K 3.9 4.3  CL 106 109  CO2 26 27  GLUCOSE 92 94  BUN 22 23  CREATININE 1.29 1.49*  CALCIUM 9.2 8.9  MG -- --  PHOS -- --   Liver Function Tests  Basename 10/17/11 1030  AST 26  ALT 21  ALKPHOS 78  BILITOT 0.2*  PROT 6.6  ALBUMIN 3.8   Cardiac Enzymes  Basename 10/18/11 0914 10/17/11 2117 10/17/11 1553  CKTOTAL 46 58 67  CKMB 3.3 3.8 4.1*  CKMBINDEX -- -- --  TROPONINI <0.30 <0.30 <0.30   Fasting Lipid Panel  Basename 10/18/11 0535  CHOL 87  HDL 36*  LDLCALC 34  TRIG 83  CHOLHDL 2.4  LDLDIRECT --   Thyroid Function Tests  Basename 10/17/11 1553  TSH  1.930  T4TOTAL --  T3FREE --  THYROIDAB --   Disposition  Pt is being discharged home today in good condition.  Follow-up Plans & Appointments  Follow-up Information    Follow up with Norma Fredrickson, NP on 11/02/2011. (9:00 AM)    Contact information:   [address] [place] [address] [phone]       Follow up with Garlan Fillers, MD. (as scheduled)    Contact information:   [address] [facility], [place]. [place] [address] [phone]       Follow up with Nilda Simmer, MD on 10/30/2011. (10:15 AM)    Contact information:   [address] [place] [address] [phone]          Discharge Medications  Medication List  As of 10/19/2011  1:35 PM   TAKE these medications         ALLEGRA 180 MG tablet   Generic drug: fexofenadine   Take 180 mg by mouth daily as needed. allergies      aspirin 81 MG tablet   Take 81 mg by mouth daily.      atorvastatin 10 MG tablet   Commonly known as: LIPITOR   Take 10 mg by mouth daily.      clopidogrel 75 MG tablet   Commonly known as: PLAVIX   Take 75 mg by mouth daily.      COLCRYS 0.6 MG tablet   Generic drug: colchicine   Take 0.6 mg by mouth daily as needed. For gout      ipratropium 0.03 % nasal spray   Commonly known as: ATROVENT   Place 2 sprays into the nose daily.      isosorbide mononitrate 60 MG 24 hr tablet   Commonly known as: IMDUR   Take 60 mg by mouth daily.      metoprolol succinate 25 MG 24 hr tablet   Commonly known as: TOPROL-XL   Take 0.5 tablets (12.5 mg total) by mouth daily.      multivitamin with minerals Tabs   Take 1 tablet by mouth daily.      nitroGLYCERIN 0.4 MG SL tablet   Commonly known as: NITROSTAT   Place 0.4 mg under the tongue every 5 (five) minutes as needed. For chest pain      pantoprazole 40 MG tablet   Commonly known as: PROTONIX   Take 40 mg by mouth daily.      ranolazine 500 MG 12 hr tablet   Commonly known as: RANEXA   Take 500 mg by mouth 2 (two) times daily.      valsartan 80 MG tablet   Commonly known as: DIOVAN   Take 1 tablet (80 mg total) by mouth daily.          Outstanding Labs/Studies  None  Duration of Discharge Encounter   Greater than 30 minutes including physician time.  Signed, Nicolasa Ducking NP 10/19/2011, 1:35 PM

## 2011-10-24 ENCOUNTER — Telehealth: Payer: Self-pay | Admitting: Cardiology

## 2011-10-24 NOTE — Telephone Encounter (Signed)
Returned call to Mr. Rossell. He reports starting Toprol XL on Monday and yesterday developed a rash on his shoulder and neck It itches but is not painful. He has not started any new medications, skin products, foods, or been outside doing yard work. The rash has not worsened since yesterday, he has no facial/oral swelling, no difficulty breathing. The itching goes away with benadryl cream. He has not had any further chest pain or palpitations. I instructed him to continue his Toprol and the benadryl cream. If the rash worsens or he develops any of the symptoms above he is to stop his Toprol and call back or go to urgent care/ER for evaluation. If the rash does not improve by Monday he will call the office. He stated understanding.  Dieter Hane PA-C 10/24/2011 9:18 AM

## 2011-10-24 NOTE — Telephone Encounter (Signed)
Returned a call to [name]. He stated that his rash has not improved and is still very itchy. He has some whelps on his legs as well. No complaints of facial/oral swelling, cp, palpitations or difficulty breathing. He is sure he has not changed anything other that starting the metoprolol. I instructed him to stop taking his metoprolol and call the office on Monday. If any of the symptoms above occur he was instructed to go to urgent/emergent care. He stated understanding.  Ferron Ishmael PA-C 10/24/2011 8:06 PM

## 2011-10-29 ENCOUNTER — Encounter: Payer: Self-pay | Admitting: Vascular Surgery

## 2011-10-30 ENCOUNTER — Encounter: Payer: Self-pay | Admitting: Vascular Surgery

## 2011-10-30 ENCOUNTER — Ambulatory Visit (INDEPENDENT_AMBULATORY_CARE_PROVIDER_SITE_OTHER): Payer: Medicare FFS | Admitting: Vascular Surgery

## 2011-10-30 VITALS — BP 134/69 | HR 66 | Temp 97.8°F | Ht 67.0 in | Wt 131.0 lb

## 2011-10-30 DIAGNOSIS — I70209 Unspecified atherosclerosis of native arteries of extremities, unspecified extremity: Secondary | ICD-10-CM

## 2011-10-30 NOTE — Progress Notes (Signed)
[facility]  Postoperative Visit  History of Present Illness  Karl Stevenson is a 78 y.o. year old male who presents for postoperative follow-up for: Right iliofemoral endarterectomy with bovine patch angioplasty (Date: 08/26/11). The right groin hematoma/seroma has reduced in size and is softer.  He denies any drainage. The patient notes continued resolution of lower extremity symptoms. The patient is able to complete their activities of daily living.   Physical Examination  Filed Vitals:   10/30/11 1027  BP: 134/69  Pulse: 66  Temp: 97.8 F (36.6 C)  TempSrc: Oral  Height: 5\' 7"  (1.702 m)  Weight: 131 lb (59.421 kg)  SpO2: 100%    RLE: Incisions are healed, smaller, softer ballotable mass in right groin, B DP strongly palpable   Medical Decision Making  Karl Stevenson is a 78 y.o. year old male who presents s/p R iliofemoral endarterectomy complicated by resolving hematoma.  The patient's R groin incision is healing appropriately with resolution of pre-operative symptoms. I expect the hematoma to resolve with time.  He is due for his surveillance studies next month: BLE ABI, BLE arterial duplex.  I will see him at that time. Thank you for allowing [place] to participate in this patient's care.  Leonides Sake, MD  [facility]  Office: [phone]  Pager: [phone]

## 2011-11-02 ENCOUNTER — Ambulatory Visit (INDEPENDENT_AMBULATORY_CARE_PROVIDER_SITE_OTHER): Payer: Medicare PPO | Admitting: Nurse Practitioner

## 2011-11-02 ENCOUNTER — Encounter: Payer: Self-pay | Admitting: Nurse Practitioner

## 2011-11-02 VITALS — BP 120/64 | HR 85 | Ht 67.0 in | Wt 130.6 lb

## 2011-11-02 DIAGNOSIS — I255 Ischemic cardiomyopathy: Secondary | ICD-10-CM

## 2011-11-02 DIAGNOSIS — R000 Tachycardia, unspecified: Secondary | ICD-10-CM

## 2011-11-02 DIAGNOSIS — I2589 Other forms of chronic ischemic heart disease: Secondary | ICD-10-CM

## 2011-11-02 DIAGNOSIS — I10 Essential (primary) hypertension: Secondary | ICD-10-CM

## 2011-11-02 NOTE — Assessment & Plan Note (Signed)
Patient has had earlier this admission for sinus tachycardia that was associated with chest pain. Heart rate was 130's. He has had a rash with Toprol and has stopped it. He currently feels fine with his current regimen. He may benefit from just having some Propranolol on hand to use just prn. His blood pressure does fluctuate and he can having readings in the 90's systolic. I will ask Dr. Excell Seltzer to review. We will tentatively see him back in October as planned. He will continue to monitor at home. Patient is agreeable to this plan and will call if any problems develop in the interim.

## 2011-11-02 NOTE — Patient Instructions (Addendum)
You may stay off the metoprolol  I am going to talk with Dr. Excell Seltzer to discuss other options for you. For now, stay with your current medicines  We will see you back in October as scheduled with Dr. Excell Seltzer  Call the [facility] office at [phone] if you have any questions, problems or concerns.

## 2011-11-02 NOTE — Progress Notes (Signed)
Karl Stevenson Date of Birth: 1926/05/14 Medical Record #[id number]  History of Present Illness: Karl Stevenson is seen back today for a post hospital visit. He is seen for Dr. Excell Seltzer. He has multiple issues which include CAD with 2 prior CABGs, first in 1987 and 2nd in 2008. He has known PVD and has multiple procedures, most recently an iliac endarterectomy which has been slow to heal. Other issues include small AAA, HTN and HLD. His last stress test was in February of 2013. His EF is 40%.   He comes in today. He is here alone. He has been to the hospital earlier this month. Noted to be tachycardic at home with a heart rate of 130. Had chest pain. Told to come to the ER. Felt to have sinus tachycardia. Was started on Metoprolol. He has had a remote history of bradycardia with beta blockers (on Tenormin in the remote past). He only took the Metoprolol for just 2 to 3 days. Had significant rash and stopped it. Since then, he has done fine. Blood pressure remains a little labile which is not unusual for him but ok for the most part. Heart rate has been primarily in the 60's but up to 90 and 100 on 2 occasions. He was not symptomatic. Says he has done fine since then and is happy with his current status. No further chest pain.   Current Outpatient Prescriptions on File Prior to Visit  Medication Sig Dispense Refill  . aspirin 81 MG tablet Take 81 mg by mouth daily.       [name] atorvastatin (LIPITOR) 10 MG tablet Take 10 mg by mouth daily.        . clopidogrel (PLAVIX) 75 MG tablet Take 75 mg by mouth daily.      [name] COLCRYS 0.6 MG tablet Take 0.6 mg by mouth daily as needed. For gout      . DIOVAN 160 MG tablet 80 mg.       . fexofenadine (ALLEGRA) 180 MG tablet Take 180 mg by mouth daily as needed. allergies      . ipratropium (ATROVENT) 0.03 % nasal spray Place 2 sprays into the nose daily.       . isosorbide mononitrate (IMDUR) 60 MG 24 hr tablet Take 60 mg by mouth daily.      . Multiple Vitamin  (MULITIVITAMIN WITH MINERALS) TABS Take 1 tablet by mouth daily.      . nitroGLYCERIN (NITROSTAT) 0.4 MG SL tablet Place 0.4 mg under the tongue every 5 (five) minutes as needed. For chest pain      . pantoprazole (PROTONIX) 40 MG tablet Take 40 mg by mouth daily.      . ranolazine (RANEXA) 500 MG 12 hr tablet Take 500 mg by mouth 2 (two) times daily.      . valsartan (DIOVAN) 80 MG tablet Take 1 tablet (80 mg total) by mouth daily.  90 tablet  3    Allergies  Allergen Reactions  . Procaine Hcl Other (See Comments)    Cold sweats and headache.     Past Medical History  Diagnosis Date  . Hyperlipidemia   . Hypertension   . Carotid artery occlusion     s/p L CEA in 2010  . Crohn's disease     colon resection  . Bradycardia     previous intolerance to beta blockers - low dose toprol started 10/2011 for tachycardia but had rash and was discontinued  . Chronic systolic heart failure  echo 7/12: EF 35-40%, anteroseptal hypokinesis, mild MR.  . Ischemic cardiomyopathy     EF 34% (Lexiscan 06/2011), 35-40% (echo 11/2010)  . Coronary artery disease     CABG 1987, redo 2008; Last LHC 1/10: SVG-OM 1/OM 2 patent, SVG-LAD patent, SVG-OM 3 Patent  . Peripheral vascular disease     Carotid dz s/p L CEA, RAS, AAA, LE dz  . Renal artery stenosis     Dopplers 12/2010 - 1-59% R ostial renal artery stenosis, 2 L renal arteries both widely patent  . Gouty arthritis   . AAA (abdominal aortic aneurysm)     Ectatic abdominal aorta with fusiform dilitation 3.4x3.4 and moderate thrombus burden 12/2010  . RBBB   . Tachycardia     a. 10/2011 - atrial tach vs avnrt - BB started.    Past Surgical History  Procedure Date  . Carotid endarterectomy 2010    left  . Colon resection   . Tonsillectomy     as a child  . Pr vein bypass graft,aorto-fem-pop 10/1985  . Coronary artery bypass graft 10/31/85 & 08/19/06  . Vein bypass surgery     LEFT   06/2011  . Endarterectomy 08/26/2011    Procedure:  ENDARTERECTOMY ILIAC;  Surgeon: [name], MD;  Location: [facility] OR;  Service: Vascular;  Laterality: Right;  Right Femoral Artery Endarterectomy with vascu guard patch angioplasty & intraoperative arteriogram.    History  Smoking status  . Never Smoker   Smokeless tobacco  . Never Used    History  Alcohol Use No    Family History  Problem Relation Age of Onset  . Heart disease Father   . Cancer Mother   . Anesthesia problems Neg Hx     Review of Systems: The review of systems is per the HPI.  All other systems were reviewed and are negative.  Physical Exam: BP 120/64  Pulse 85  Ht 5\' 7"  (1.702 m)  Wt 130 lb 9.6 oz (59.24 kg)  BMI 20.45 kg/m2  SpO2 97% Patient is very pleasant and in no acute distress. Looks younger than his stated age. Skin is warm and dry. Color is normal.  HEENT is unremarkable. Normocephalic/atraumatic. PERRL. Sclera are nonicteric. Neck is supple. No masses. No JVD. Lungs are clear. Cardiac exam shows a regular rate and rhythm. Abdomen is soft. Extremities are without edema. Gait and ROM are intact. No gross neurologic deficits noted.   LABORATORY DATA:  Lab Results  Component Value Date   WBC 4.7 10/19/2011   HGB 12.4* 10/19/2011   HCT 36.2* 10/19/2011   PLT 105* 10/19/2011   GLUCOSE 92 10/19/2011   CHOL 87 10/18/2011   TRIG 83 10/18/2011   HDL 36* 10/18/2011   LDLCALC 34 10/18/2011   ALT 21 10/17/2011   AST 26 10/17/2011   NA 141 10/19/2011   K 3.9 10/19/2011   CL 106 10/19/2011   CREATININE 1.29 10/19/2011   BUN 22 10/19/2011   CO2 26 10/19/2011   TSH 1.930 10/17/2011   INR 1.03 08/12/2011     Assessment / Plan:

## 2011-11-02 NOTE — Assessment & Plan Note (Signed)
Blood pressure remains a little labile which has been chronic for him. Overall it is acceptable with his current regimen. He does a good job of monitoring at home.

## 2011-11-02 NOTE — Assessment & Plan Note (Signed)
Currently looks compensated. No further chest pain. Did not tolerate the Toprol due to significant rash.

## 2011-11-03 ENCOUNTER — Other Ambulatory Visit: Payer: Self-pay | Admitting: *Deleted

## 2011-11-03 ENCOUNTER — Telehealth: Payer: Self-pay | Admitting: *Deleted

## 2011-11-03 MED ORDER — PROPRANOLOL HCL 10 MG PO TABS: 10.0000 mg | ORAL_TABLET | ORAL | Status: DC | PRN

## 2011-11-03 NOTE — Telephone Encounter (Signed)
Patient notified.  Aware of directions on when to take medication and how to take.  Pt aware of meds at pharmacy.  Vista Mink, CMA

## 2011-11-03 NOTE — Telephone Encounter (Signed)
Message copied by Awilda Bill on Tue Nov 03, 2011 10:35 AM ------      Message from: Rosalio Macadamia      Created: Tue Nov 03, 2011  7:36 AM       Marchelle Folks,      Will you call Mr. Brooks and tell him that I spoke with Dr. Excell Seltzer. We need to send in a prescription for Inderal 10 mg to take only prn heart rate greater than 110  #30 with 3 refills.                  Thanks            Lawson Fiscal

## 2011-12-10 ENCOUNTER — Encounter: Payer: Self-pay | Admitting: Vascular Surgery

## 2011-12-11 ENCOUNTER — Encounter (INDEPENDENT_AMBULATORY_CARE_PROVIDER_SITE_OTHER): Payer: Medicare PPO | Admitting: *Deleted

## 2011-12-11 ENCOUNTER — Ambulatory Visit (INDEPENDENT_AMBULATORY_CARE_PROVIDER_SITE_OTHER): Payer: Medicare PPO | Admitting: Vascular Surgery

## 2011-12-11 ENCOUNTER — Encounter: Payer: Self-pay | Admitting: Vascular Surgery

## 2011-12-11 VITALS — BP 122/73 | HR 66 | Temp 97.7°F | Ht 67.0 in | Wt 133.0 lb

## 2011-12-11 DIAGNOSIS — Z48812 Encounter for surgical aftercare following surgery on the circulatory system: Secondary | ICD-10-CM

## 2011-12-11 DIAGNOSIS — I70209 Unspecified atherosclerosis of native arteries of extremities, unspecified extremity: Secondary | ICD-10-CM

## 2011-12-11 NOTE — Progress Notes (Signed)
[facility] [place]  Established Previous Bypass  History of Present Illness  Karl Stevenson is a 78 y.o. (16-Jun-1926) male who presents with chief complaint: none.  Previous operation(s) include: Bilateral iliofemoral endarterectomies with patch angioplasty (Date: 08/26/11 an 07/01/11).  The patient's symptoms have resolved.  The patient's symptoms are: continued right groin lump. The patient's treatment regimen currently included: maximal medical management.  Past Medical History, Past Surgical History, Social History, Family History, Medications, Allergies, and Review of Systems are unchanged from previous evaluation on 10/30/11.  Physical Examination  Filed Vitals:   12/11/11 1056  BP: 122/73  Pulse: 66  Temp: 97.7 F (36.5 C)  TempSrc: Oral  Height: 5\' 7"  (1.702 m)  Weight: 133 lb (60.328 kg)  SpO2: 99%   Body mass index is 20.83 kg/(m^2).  General: A&O x 3, elderly  Pulmonary: Sym exp, good air movt, CTAB, no rales, rhonchi, & wheezing  Cardiac: RRR, Nl S1, S2, no Murmurs, rubs or gallops  Vascular: Vessel Right Left  Radial Palpable Palpable  Brachial Palpable Palpable  Carotid Palpable, without bruit Palpable, without bruit  Aorta Non-palpable N/A  Femoral Palpable Palpable  Popliteal Non-palpable Non-palpable  PT Palpable Palpable  DP Palpable Palpable   Musculoskeletal: M/S 5/5 throughout , Extremities without ischemic changes , ballotable right groin mass that is more broad based than previously  Neurologic: Pain and light touch intact in extremities , Motor exam as listed above  Non-Invasive Vascular Imaging ABI (Date: 12/11/11)  RLE: 1.15  LLE: 0.99  BLE Arterial Duplex (Date: 12/11/11)  Normal waveforms bilateral  Cystic structure in right groin without any internal blood flow: 6.8 cm x 4.2 cm x 2.8 cm  Medical Decision Making  Karl Stevenson is a 78 y.o. male who presents with: s/p B iliofemoral endarterectomy with bovine  patch angioplasty for femoral artery occlusion .  The right groin mass is now consistent with seroma rather than hematoma as I previously thought.  It is asx at this point, so I would give it time to see if it spontaneously involutes on its own.  Based on the patient's vascular studies and examination, I have offered the patient: continued surveillance q6 months: BLE ABI and BLE arterial duplex.  I discussed in depth with the patient the nature of atherosclerosis, and emphasized the importance of maximal medical management including strict control of blood pressure, blood glucose, and lipid levels, obtaining regular exercise, and cessation of smoking.  The patient is aware that without maximal medical management the underlying atherosclerotic disease process will progress, limiting the benefit of any interventions.  I discussed in depth with the patient the need for long term surveillance to improve the primary assisted patency of his bypass.  The patient agrees to cooperate with such.  The patient is scheduled for the previous listed surveillance studies in 6 months.  Thank you for allowing [place] to participate in this patient's care.  Leonides Sake, MD [facility] [place] Office: [phone] Pager: [phone]  12/11/2011, 11:32 AM

## 2011-12-11 NOTE — Procedures (Unsigned)
LOWER EXTREMITY ARTERIAL DUPLEX  INDICATION:  Bilateral femoral artery stenosis.  HISTORY: Diabetes:  No Cardiac:  No Hypertension:  Yes Smoking:  No Previous Surgery:  Right iliofemoral endarterectomy on 08/26/2011, left iliofemoral endarterectomy on 07/01/2011  SINGLE LEVEL ARTERIAL EXAM                         RIGHT                LEFT Brachial: Anterior tibial: Posterior tibial: Peroneal: Ankle/Brachial Index:  LOWER EXTREMITY ARTERIAL DUPLEX EXAM  DUPLEX: 1. Normal Doppler waveforms noted throughout the bilateral lower     extremity arterial systems, with no velocities measuring greater     than 200 cm/second. 2. There is a complex cystic structure, with no internal flow, noted     in the right groin area, anterior to vasculature, measuring 6.8 x     4.2 x 2.8 cm.  IMPRESSION: 1. Patent bilateral iliofemoral endarterectomy sites, with no evidence     of hemodynamically significant stenoses of the bilateral lower     extremity arterial systems. 2. Cystic structure noted in the right groin area, as described above. 3. Bilateral ankle-brachial indices are noted in a separate report.  ___________________________________________ [name], MD  CH/MEDQ  D:  12/11/2011  T:  12/11/2011  Job:  [id number]

## 2012-01-30 ENCOUNTER — Encounter (HOSPITAL_COMMUNITY): Payer: Self-pay | Admitting: Emergency Medicine

## 2012-01-30 ENCOUNTER — Other Ambulatory Visit: Payer: Self-pay

## 2012-01-30 ENCOUNTER — Emergency Department (HOSPITAL_COMMUNITY)
Admission: EM | Admit: 2012-01-30 | Discharge: 2012-01-31 | Disposition: A | Discharge status: Home / Self Care | Payer: Medicare PPO | Attending: Emergency Medicine | Admitting: Emergency Medicine

## 2012-01-30 DIAGNOSIS — Z79899 Other long term (current) drug therapy: Secondary | ICD-10-CM | POA: Insufficient documentation

## 2012-01-30 DIAGNOSIS — K5289 Other specified noninfective gastroenteritis and colitis: Secondary | ICD-10-CM | POA: Insufficient documentation

## 2012-01-30 DIAGNOSIS — R197 Diarrhea, unspecified: Secondary | ICD-10-CM | POA: Insufficient documentation

## 2012-01-30 DIAGNOSIS — K529 Noninfective gastroenteritis and colitis, unspecified: Secondary | ICD-10-CM

## 2012-01-30 DIAGNOSIS — Z7982 Long term (current) use of aspirin: Secondary | ICD-10-CM | POA: Insufficient documentation

## 2012-01-30 DIAGNOSIS — I10 Essential (primary) hypertension: Secondary | ICD-10-CM | POA: Insufficient documentation

## 2012-01-30 DIAGNOSIS — I5022 Chronic systolic (congestive) heart failure: Secondary | ICD-10-CM | POA: Insufficient documentation

## 2012-01-30 LAB — CBC WITH DIFFERENTIAL/PLATELET
Basophils Relative: 0 % (ref 0–1)
HCT: 36.3 % — ABNORMAL LOW (ref 39.0–52.0)
Hemoglobin: 12.2 g/dL — ABNORMAL LOW (ref 13.0–17.0)
Lymphs Abs: 0.5 10*3/uL — ABNORMAL LOW (ref 0.7–4.0)
MCHC: 33.6 g/dL (ref 30.0–36.0)
Monocytes Absolute: 0.7 10*3/uL (ref 0.1–1.0)
Monocytes Relative: 9 % (ref 3–12)
Neutro Abs: 5.9 10*3/uL (ref 1.7–7.7)
Neutrophils Relative %: 83 % — ABNORMAL HIGH (ref 43–77)
RBC: 3.73 MIL/uL — ABNORMAL LOW (ref 4.22–5.81)

## 2012-01-30 LAB — COMPREHENSIVE METABOLIC PANEL
ALT: 18 U/L (ref 0–53)
AST: 19 U/L (ref 0–37)
Albumin: 3.5 g/dL (ref 3.5–5.2)
BUN: 27 mg/dL — ABNORMAL HIGH (ref 6–23)
CO2: 24 mEq/L (ref 19–32)
Calcium: 8.7 mg/dL (ref 8.4–10.5)
Chloride: 108 mEq/L (ref 96–112)
Glucose, Bld: 99 mg/dL (ref 70–99)
Potassium: 4.1 mEq/L (ref 3.5–5.1)
Total Protein: 6 g/dL (ref 6.0–8.3)

## 2012-01-30 MED ORDER — SODIUM CHLORIDE 0.9 % IV BOLUS (SEPSIS)
500.0000 mL | Freq: Once | INTRAVENOUS | Status: AC
  Administered 2012-01-30: 500 mL via INTRAVENOUS

## 2012-01-30 MED ORDER — SODIUM CHLORIDE 0.9 % IV BOLUS (SEPSIS)
1000.0000 mL | Freq: Once | INTRAVENOUS | Status: AC
  Administered 2012-01-30: 1000 mL via INTRAVENOUS

## 2012-01-30 MED ORDER — DIPHENOXYLATE-ATROPINE 2.5-0.025 MG PO TABS: 1.0000 | ORAL_TABLET | Freq: Four times a day (QID) | ORAL | Status: DC | PRN

## 2012-01-30 MED ORDER — ONDANSETRON HCL 8 MG PO TABS: 8.0000 mg | ORAL_TABLET | ORAL | Status: DC | PRN

## 2012-01-30 MED ORDER — ONDANSETRON HCL 4 MG/2ML IJ SOLN
INTRAMUSCULAR | Status: AC
  Filled 2012-01-30: qty 2

## 2012-01-30 MED ORDER — PANTOPRAZOLE SODIUM 40 MG IV SOLR
40.0000 mg | Freq: Once | INTRAVENOUS | Status: AC
  Administered 2012-01-30: 40 mg via INTRAVENOUS
  Filled 2012-01-30: qty 40

## 2012-01-30 NOTE — ED Notes (Signed)
Dr Cook at bedside

## 2012-01-30 NOTE — ED Notes (Signed)
Patient currently sitting up in bed; no respiratory or acute distress noted.  Reita Cliche, Consulting civil engineer at bedside apologizing for delay in EDP exam.  Protocol orders initiated.  Patient has no other questions or concerns at this time; states that his nausea is still relieved from the zofran.  Will continue to monitor.

## 2012-01-30 NOTE — ED Notes (Signed)
The patient states that he had just finished eating dinner when he started feeling nauseous.  He had one bout of diarrhea at his home and, he vomited twice.  EMS gave Zofran 4 mg, iv x 1, and the patient states that he feels well enough to go home now.  The patient has an extensive cardiac history, but he denies any cardiac complaints.

## 2012-01-30 NOTE — ED Notes (Signed)
Received bedside report from Damon, Charity fundraiser.  Patient currently resting quietly in bed; no respiratory or acute distress noted.  Patient denies nausea at this time; patient states that he feels better after the Zofran.  Patient has no other questions or concerns at this time; currently pending EDP exam.  Will continue to monitor.

## 2012-01-31 NOTE — ED Provider Notes (Signed)
History     CSN: [id number]  Arrival date & time 01/30/12  2017   First MD Initiated Contact with Patient 01/30/12 2246      Chief Complaint  Patient presents with  . Nausea  . Emesis  . Diarrhea    (Consider location/radiation/quality/duration/timing/severity/associated sxs/prior treatment) HPI.... nausea, vomiting, diarrhea after eating dinner tonight. Felt weak and sweaty.  Given IV Zofran via EMS. Feels much better now.  Nothing makes symptoms better or worse. Severity is mild to moderate. Minimal epigastric discomfort.  Past Medical History  Diagnosis Date  . Hyperlipidemia   . Hypertension   . Carotid artery occlusion     s/p L CEA in 2010  . Crohn's disease     colon resection  . Bradycardia     previous intolerance to beta blockers - low dose toprol started 10/2011 for tachycardia but had rash and was discontinued  . Chronic systolic heart failure     echo 7/12: EF 35-40%, anteroseptal hypokinesis, mild MR.  . Ischemic cardiomyopathy     EF 34% (Lexiscan 06/2011), 35-40% (echo 11/2010)  . Coronary artery disease     CABG 1987, redo 2008; Last LHC 1/10: SVG-OM 1/OM 2 patent, SVG-LAD patent, SVG-OM 3 Patent  . Peripheral vascular disease     Carotid dz s/p L CEA, RAS, AAA, LE dz  . Renal artery stenosis     Dopplers 12/2010 - 1-59% R ostial renal artery stenosis, 2 L renal arteries both widely patent  . Gouty arthritis   . AAA (abdominal aortic aneurysm)     Ectatic abdominal aorta with fusiform dilitation 3.4x3.4 and moderate thrombus burden 12/2010  . RBBB   . Tachycardia     a. 10/2011 - atrial tach vs avnrt - BB started.    Past Surgical History  Procedure Date  . Carotid endarterectomy 2010    left  . Colon resection   . Tonsillectomy     as a child  . Pr vein bypass graft,aorto-fem-pop 10/1985  . Coronary artery bypass graft 10/31/85 & 08/19/06  . Vein bypass surgery     LEFT   06/2011  . Endarterectomy 08/26/2011    Procedure: ENDARTERECTOMY ILIAC;   Surgeon: [name], MD;  Location: [facility] OR;  Service: Vascular;  Laterality: Right;  Right Femoral Artery Endarterectomy with vascu guard patch angioplasty & intraoperative arteriogram.    Family History  Problem Relation Age of Onset  . Heart disease Father   . Cancer Mother   . Anesthesia problems Neg Hx     History  Substance Use Topics  . Smoking status: Never Smoker   . Smokeless tobacco: Never Used  . Alcohol Use: No      Review of Systems  All other systems reviewed and are negative.    Allergies  Novocain  Home Medications   Current Outpatient Rx  Name Route Sig Dispense Refill  . ASPIRIN 81 MG PO TABS Oral Take 81 mg by mouth daily.     . ATORVASTATIN CALCIUM 10 MG PO TABS Oral Take 10 mg by mouth at bedtime.     . CLOPIDOGREL BISULFATE 75 MG PO TABS Oral Take 75 mg by mouth every morning.     [name] COLCRYS 0.6 MG PO TABS Oral Take 0.6 mg by mouth daily as needed. For gout    . DIOVAN 160 MG PO TABS Oral Take 80 mg by mouth every morning.     [name] DIPHENHYDRAMINE HCL 25 MG PO CAPS Oral Take 25  mg by mouth daily.    . IPRATROPIUM BROMIDE 0.03 % NA SOLN Nasal Place 1 spray into the nose daily.     . ISOSORBIDE MONONITRATE ER 60 MG PO TB24 Oral Take 60 mg by mouth every morning.     [name] METOPROLOL SUCCINATE ER 25 MG PO TB24 Oral Take 25 mg by mouth every morning.     . ADULT MULTIVITAMIN W/MINERALS CH Oral Take 1 tablet by mouth every evening.     [name] NITROGLYCERIN 0.4 MG SL SUBL Sublingual Place 0.4 mg under the tongue every 5 (five) minutes as needed. For chest pain    . PROPRANOLOL HCL 10 MG PO TABS Oral Take 10 mg by mouth as needed. As needed for heart rate greater than 110 bpm.    . RANOLAZINE ER 500 MG PO TB12 Oral Take 500 mg by mouth 2 (two) times daily.    [name] DIPHENOXYLATE-ATROPINE 2.5-0.025 MG PO TABS Oral Take 1 tablet by mouth 4 (four) times daily as needed for diarrhea or loose stools. 15 tablet 0  . ONDANSETRON HCL 8 MG PO TABS Oral Take 1 tablet (8 mg total) by  mouth every 4 (four) hours as needed for nausea. 8 tablet 0    BP 121/64  Pulse 77  Temp 98 F (36.7 C) (Oral)  Resp 17  Ht 5\' 7"  (1.702 m)  Wt 133 lb (60.328 kg)  BMI 20.83 kg/m2  SpO2 96%  Physical Exam  Nursing note and vitals reviewed. Constitutional: He is oriented to person, place, and time. He appears well-developed and well-nourished.       Good color, smiling  HENT:  Head: Normocephalic and atraumatic.  Eyes: Conjunctivae normal and EOM are normal. Pupils are equal, round, and reactive to light.  Neck: Normal range of motion. Neck supple.  Cardiovascular: Normal rate, regular rhythm and normal heart sounds.   Pulmonary/Chest: Effort normal and breath sounds normal.  Abdominal: Soft. Bowel sounds are normal.  Musculoskeletal: Normal range of motion.  Neurological: He is alert and oriented to person, place, and time.  Skin: Skin is warm and dry.  Psychiatric: He has a normal mood and affect.    ED Course  Procedures (including critical care time)  Labs Reviewed  CBC WITH DIFFERENTIAL - Abnormal; Notable for the following:    RBC 3.73 (*)     Hemoglobin 12.2 (*)     HCT 36.3 (*)     Platelets 102 (*)  CONSISTENT WITH PREVIOUS RESULT   Neutrophils Relative 83 (*)     Lymphocytes Relative 7 (*)     Lymphs Abs 0.5 (*)     All other components within normal limits  COMPREHENSIVE METABOLIC PANEL - Abnormal; Notable for the following:    BUN 27 (*)     GFR calc non Af Amer 54 (*)     GFR calc Af Amer 63 (*)     All other components within normal limits  LIPASE, BLOOD  URINALYSIS, ROUTINE W REFLEX MICROSCOPIC   No results found.   1. Gastroenteritis       MDM  Patient feeling much better after IV fluids, IV Protonix, IV Zofran. No acute abdomen.        Donnetta Hutching, MD 01/31/12 0002

## 2012-01-31 NOTE — ED Notes (Signed)
Pt states understanding of discharge instructions 

## 2012-02-05 ENCOUNTER — Other Ambulatory Visit: Payer: Self-pay | Admitting: Nurse Practitioner

## 2012-02-23 ENCOUNTER — Encounter: Payer: Self-pay | Admitting: Cardiovascular Disease

## 2012-02-23 ENCOUNTER — Ambulatory Visit (INDEPENDENT_AMBULATORY_CARE_PROVIDER_SITE_OTHER): Payer: Medicare PPO | Admitting: Cardiovascular Disease

## 2012-02-23 VITALS — BP 128/74 | HR 86 | Ht 67.0 in | Wt 134.8 lb

## 2012-02-23 DIAGNOSIS — I2510 Atherosclerotic heart disease of native coronary artery without angina pectoris: Secondary | ICD-10-CM

## 2012-02-23 NOTE — Progress Notes (Signed)
HPI:  78 year old gentleman presenting for followup evaluation. He has undergone extensive vascular surgery over the past year in the aortoiliac region bilaterally. He was last seen in our office by Norma Fredrickson in June 2013. The patient is followed for coronary artery disease status post redo coronary bypass surgery in 2008. He also has hyperlipidemia and history of bradycardia and tachycardia, but has not required permanent pacemaker placement. He has propranolol for when necessary use. He had a rash with metoprolol succinate.  He has really been feeling pretty well over the past few months. He brings in blood pressure readings that are in a good range. His heart rate has been up and down but the highest heart rate I see is 110 beats per minute. He has only taken propranolol a few times when he detects a heart rate greater than 100 on his monitor. He's had no symptoms of palpitations. His blood pressure readings are primarily in the 120s with a few systolic readings in the 150s. His diastolic pressures are all in the 60s and 70s.  The patient has had no chest pain or pressure, dyspnea, or leg edema. He is able to walk much better since his vascular surgery.  Outpatient Encounter Prescriptions as of 02/23/2012  Medication Sig Dispense Refill  . aspirin 81 MG tablet Take 81 mg by mouth daily.       [name] atorvastatin (LIPITOR) 10 MG tablet Take 10 mg by mouth at bedtime.       . clopidogrel (PLAVIX) 75 MG tablet Take 75 mg by mouth every morning.       [name] COLCRYS 0.6 MG tablet Take 0.6 mg by mouth daily as needed. For gout      . DIOVAN 160 MG tablet Take 80 mg by mouth every morning.       . diphenhydrAMINE (BENADRYL) 25 mg capsule Take 25 mg by mouth daily.      [name] ipratropium (ATROVENT) 0.03 % nasal spray Place 1 spray into the nose daily.       . isosorbide mononitrate (IMDUR) 60 MG 24 hr tablet TAKE ONE TABLET BY MOUTH EVERY DAY  90 tablet  2  . Multiple Vitamin (MULITIVITAMIN WITH MINERALS) TABS  Take 1 tablet by mouth every evening.       . nitroGLYCERIN (NITROSTAT) 0.4 MG SL tablet Place 0.4 mg under the tongue every 5 (five) minutes as needed. For chest pain      . ranolazine (RANEXA) 500 MG 12 hr tablet Take 500 mg by mouth 2 (two) times daily.      . diphenoxylate-atropine (LOMOTIL) 2.5-0.025 MG per tablet Take 1 tablet by mouth 4 (four) times daily as needed for diarrhea or loose stools.  15 tablet  0  . DISCONTD: isosorbide mononitrate (IMDUR) 60 MG 24 hr tablet Take 60 mg by mouth every morning.       [name] DISCONTD: metoprolol succinate (TOPROL-XL) 25 MG 24 hr tablet Take 25 mg by mouth every morning.       [name] DISCONTD: ondansetron (ZOFRAN) 8 MG tablet Take 1 tablet (8 mg total) by mouth every 4 (four) hours as needed for nausea.  8 tablet  0  . DISCONTD: propranolol (INDERAL) 10 MG tablet Take 10 mg by mouth as needed. As needed for heart rate greater than 110 bpm.        Allergies  Allergen Reactions  . Novocain (Procaine Hcl) Other (See Comments)    Cold sweats and headache.   . Metoprolol  Past Medical History  Diagnosis Date  . Hyperlipidemia   . Hypertension   . Carotid artery occlusion     s/p L CEA in 2010  . Crohn's disease     colon resection  . Bradycardia     previous intolerance to beta blockers - low dose toprol started 10/2011 for tachycardia but had rash and was discontinued  . Chronic systolic heart failure     echo 7/12: EF 35-40%, anteroseptal hypokinesis, mild MR.  . Ischemic cardiomyopathy     EF 34% (Lexiscan 06/2011), 35-40% (echo 11/2010)  . Coronary artery disease     CABG 1987, redo 2008; Last LHC 1/10: SVG-OM 1/OM 2 patent, SVG-LAD patent, SVG-OM 3 Patent  . Peripheral vascular disease     Carotid dz s/p L CEA, RAS, AAA, LE dz  . Renal artery stenosis     Dopplers 12/2010 - 1-59% R ostial renal artery stenosis, 2 L renal arteries both widely patent  . Gouty arthritis   . AAA (abdominal aortic aneurysm)     Ectatic abdominal aorta with  fusiform dilitation 3.4x3.4 and moderate thrombus burden 12/2010  . RBBB   . Tachycardia     a. 10/2011 - atrial tach vs avnrt - BB started.    ROS: Negative except as per HPI  BP 128/74  Pulse 86  Ht 5\' 7"  (1.702 m)  Wt 61.145 kg (134 lb 12.8 oz)  BMI 21.11 kg/m2  SpO2 99%  PHYSICAL EXAM: Pt is alert and oriented, NAD HEENT: normal Neck: JVP - normal, carotids 2+= without bruits Lungs: CTA bilaterally CV: RRR without murmur or gallop Abd: soft, NT, Positive BS, no hepatomegaly Ext: no C/C/E, distal pulses intact and equal Skin: warm/dry no rash  ASSESSMENT AND PLAN: 1. Coronary artery disease status post CABG. The patient is stable without anginal symptoms. He will return for followup in 4 months with Norma Fredrickson. His medical regimen is appropriate with aspirin, Plavix, and atorvastatin. His antianginal regimen includes Imdur and Ranexa.  2. Hyperlipidemia. He is on a statin drug. He has his annual physical with Dr. Eloise Harman in December and I would anticipate lab work associated with that visit.  3. Tachycardia. This appears to be under good control he will continue as needed use of propranolol.  4. Peripheral arterial disease. The patient is followed by Dr. Imogene Burn.  [name] 02/23/2012 9:34 AM

## 2012-02-23 NOTE — Patient Instructions (Addendum)
Your physician recommends that you schedule a follow-up appointment in: 4 months with Norma Fredrickson NP     Continue same medication

## 2012-03-18 ENCOUNTER — Other Ambulatory Visit: Payer: Self-pay | Admitting: Cardiovascular Disease

## 2012-03-29 ENCOUNTER — Other Ambulatory Visit: Payer: Self-pay | Admitting: Cardiovascular Disease

## 2012-04-18 ENCOUNTER — Other Ambulatory Visit: Payer: Self-pay | Admitting: Nurse Practitioner

## 2012-04-19 ENCOUNTER — Telehealth: Payer: Self-pay | Admitting: Cardiology

## 2012-04-19 ENCOUNTER — Ambulatory Visit: Payer: Medicare PPO | Admitting: Nurse Practitioner

## 2012-04-19 ENCOUNTER — Ambulatory Visit (INDEPENDENT_AMBULATORY_CARE_PROVIDER_SITE_OTHER): Payer: Medicare PPO | Admitting: Cardiovascular Disease

## 2012-04-19 ENCOUNTER — Encounter: Payer: Self-pay | Admitting: Cardiovascular Disease

## 2012-04-19 VITALS — BP 180/80 | HR 74 | Ht 66.0 in | Wt 131.0 lb

## 2012-04-19 DIAGNOSIS — R079 Chest pain, unspecified: Secondary | ICD-10-CM

## 2012-04-19 LAB — BASIC METABOLIC PANEL
BUN: 22 mg/dL (ref 6–23)
CO2: 29 mEq/L (ref 19–32)
Calcium: 9.2 mg/dL (ref 8.4–10.5)
Chloride: 103 mEq/L (ref 96–112)
Creatinine, Ser: 1.4 mg/dL (ref 0.4–1.5)
GFR: 51.43 mL/min — ABNORMAL LOW (ref >60.00)
Glucose, Bld: 110 mg/dL — ABNORMAL HIGH (ref 70–99)
Potassium: 3.9 mEq/L (ref 3.5–5.1)
Sodium: 137 mEq/L (ref 135–145)

## 2012-04-19 LAB — TROPONIN I: Troponin I: <0.3 ng/mL (ref <0.30)

## 2012-04-19 MED ORDER — PANTOPRAZOLE SODIUM 40 MG PO TBEC: 40.0000 mg | DELAYED_RELEASE_TABLET | Freq: Every day | ORAL | Status: DC

## 2012-04-19 MED ORDER — AMLODIPINE BESYLATE 5 MG PO TABS: 5.0000 mg | ORAL_TABLET | Freq: Every day | ORAL | Status: DC

## 2012-04-19 MED ORDER — VALSARTAN 80 MG PO TABS: 80.0000 mg | ORAL_TABLET | Freq: Every day | ORAL | Status: DC

## 2012-04-19 NOTE — Progress Notes (Signed)
Karl Stevenson Date of Birth: December 29, 1926 Medical Record #[id number]  History of Present Illness: Karl Stevenson is seen back today for a work in visit. He is seen for Dr. Excell Seltzer today. He has multiple issues which include CAD with 2 prior CABGs, first in 1987 and 2nd in 2008. He has known PVD with multiple PV procedures. Other issues include AAA, HTN and HLD. Last stress test was in February of 2013. EF is 40%. He has a history of bradycardia and tachycardia but has not required PTVP implant. He will use prn propranolol. He has a known bifascicular block.   He was last here in October and felt to be doing well.   He comes in today. He is here alone. He has noted some tightness in his chest over the past week or so. He feels that it is stress related. Walking makes it better. Has some radiation to the left shoulder. It will just come and go. Not exertional. Still walking almost every day if the weather permits. It can last for hours. He notes that his blood pressure has been in the 180 to 200 range. He does not feel like this is his heart pain and keeps telling me that his heart is ok. He has not been dizzy or lightheaded. No syncope. Not short of breath.   Current Outpatient Prescriptions on File Prior to Visit  Medication Sig Dispense Refill  . aspirin 81 MG tablet Take 81 mg by mouth daily.       [name] atorvastatin (LIPITOR) 10 MG tablet Take 10 mg by mouth at bedtime.       . clopidogrel (PLAVIX) 75 MG tablet TAKE ONE TABLET BY MOUTH EVERY DAY  90 tablet  2  . COLCRYS 0.6 MG tablet Take 0.6 mg by mouth daily as needed. For gout      . diphenhydrAMINE (BENADRYL) 25 mg capsule Take 25 mg by mouth daily.      [name] ipratropium (ATROVENT) 0.03 % nasal spray Place 1 spray into the nose daily.       . isosorbide mononitrate (IMDUR) 60 MG 24 hr tablet TAKE ONE TABLET BY MOUTH EVERY DAY  90 tablet  2  . Multiple Vitamin (MULITIVITAMIN WITH MINERALS) TABS Take 1 tablet by mouth every evening.       .  nitroGLYCERIN (NITROSTAT) 0.4 MG SL tablet Place 0.4 mg under the tongue every 5 (five) minutes as needed. For chest pain      . ranolazine (RANEXA) 500 MG 12 hr tablet Take 500 mg by mouth 2 (two) times daily.      . [DISCONTINUED] RANEXA 500 MG 12 hr tablet TAKE ONE TABLET BY MOUTH TWICE DAILY  180 tablet  2    Allergies  Allergen Reactions  . Novocain (Procaine Hcl) Other (See Comments)    Cold sweats and headache.   . Metoprolol     Past Medical History  Diagnosis Date  . Hyperlipidemia   . Hypertension   . Carotid artery occlusion     s/p L CEA in 2010  . Crohn's disease     colon resection  . Bradycardia     previous intolerance to beta blockers - low dose toprol started 10/2011 for tachycardia but had rash and was discontinued  . Chronic systolic heart failure     echo 7/12: EF 35-40%, anteroseptal hypokinesis, mild MR.  . Ischemic cardiomyopathy     EF 34% (Lexiscan 06/2011), 35-40% (echo 11/2010)  . Coronary artery disease  CABG 1987, redo 2008; Last LHC 1/10: SVG-OM 1/OM 2 patent, SVG-LAD patent, SVG-OM 3 Patent  . Peripheral vascular disease     Carotid dz s/p L CEA, RAS, AAA, LE dz  . Renal artery stenosis     Dopplers 12/2010 - 1-59% R ostial renal artery stenosis, 2 L renal arteries both widely patent  . Gouty arthritis   . AAA (abdominal aortic aneurysm)     Ectatic abdominal aorta with fusiform dilitation 3.4x3.4 and moderate thrombus burden 12/2010  . RBBB   . Tachycardia     a. 10/2011 - atrial tach vs avnrt - BB started.    Past Surgical History  Procedure Date  . Carotid endarterectomy 2010    left  . Colon resection   . Tonsillectomy     as a child  . Pr vein bypass graft,aorto-fem-pop 10/1985  . Coronary artery bypass graft 10/31/85 & 08/19/06  . Vein bypass surgery     LEFT   06/2011  . Endarterectomy 08/26/2011    Procedure: ENDARTERECTOMY ILIAC;  Surgeon: [name], MD;  Location: [facility] OR;  Service: Vascular;  Laterality: Right;  Right Femoral  Artery Endarterectomy with vascu guard patch angioplasty & intraoperative arteriogram.    History  Smoking status  . Never Smoker   Smokeless tobacco  . Never Used    History  Alcohol Use No    Family History  Problem Relation Age of Onset  . Heart disease Father   . Cancer Mother   . Anesthesia problems Neg Hx     Review of Systems: The review of systems is per the HPI.  All other systems were reviewed and are negative.  Physical Exam: BP 180/80  Pulse 74  Ht 5\' 6"  (1.676 m)  Wt 131 lb (59.421 kg)  BMI 21.14 kg/m2  SpO2 99% Patient is very pleasant and in no acute distress. Looks younger than his stated age. Skin is warm and dry. Color is normal.  HEENT is unremarkable. Normocephalic/atraumatic. PERRL. Sclera are nonicteric. Neck is supple. No masses. No JVD. Lungs are clear. Cardiac exam shows a regular rate and rhythm. Abdomen is soft. Extremities are without edema. Gait and ROM are intact. No gross neurologic deficits noted.  LABORATORY DATA: EKG with sinus rhythm, bifascicular block with anterolateral T wave changes. Tracing is unchanged.    Assessment / Plan: 1. Chest pain - he does not feel like this is his angina, but more related to elevated BP. Improves with walking. He has had BP readings over 200 at home. I am starting Norvasc 5 mg a day. He will continue to monitor. We will check a troponin today as well given the prolonged episodes that he reports.   2. Tachy/brady - with bifascicular block - no syncope noted. Not a candidate for beta blocker.   3. Known CAD with prior CABG and redo CABG - negative stress test in February of 2013.  I will see him back in about 7 to 10 days. Norvasc is added. He has NTG on hand and is to use if needed.  Patient is agreeable to this plan and will call if any problems develop in the interim.

## 2012-04-19 NOTE — Telephone Encounter (Signed)
Returned call to Mr. Reichelt. Reviewed last office visit note. Patient reports waking up this morning feeling ok, but around 6:30 developed chest pressure with left arm and left leg numbness as well as a soreness in his mid back. His SBP was initially 210. He took a NTG and his morning BP meds with resolution of chest pain and BP reduction to . He is now feeling "good" except some mild soreness in his mid back. He did not have any associated sob, diaphoresis, nausea, or dizziness. He does not want to go to the ED. Discussed with Dr. Excell Seltzer who will see him in the clinic today, however, if his symptoms return before his appointment time he is to seek immediate medical attention.   [place], PA-C 04/19/2012 7:06 AM

## 2012-04-19 NOTE — Patient Instructions (Addendum)
I want to check some lab today  I am adding Norvasc 5 mg a day for your blood pressure and your chest pain.  I have refilled your Valsartan and Protonix today to [facility].  I will see you back in about a week  Continue to monitor your blood pressure at home.   Call the [facility] office at [phone] if you have any questions, problems or concerns.

## 2012-04-20 ENCOUNTER — Other Ambulatory Visit: Payer: Self-pay | Admitting: Nurse Practitioner

## 2012-04-26 ENCOUNTER — Encounter: Payer: Self-pay | Admitting: Nurse Practitioner

## 2012-04-26 ENCOUNTER — Ambulatory Visit (INDEPENDENT_AMBULATORY_CARE_PROVIDER_SITE_OTHER): Payer: Medicare PPO | Admitting: Nurse Practitioner

## 2012-04-26 VITALS — BP 140/80 | HR 64 | Ht 67.0 in | Wt 132.8 lb

## 2012-04-26 DIAGNOSIS — I10 Essential (primary) hypertension: Secondary | ICD-10-CM

## 2012-04-26 NOTE — Progress Notes (Signed)
Karl Stevenson Date of Birth: 1926/07/01 Medical Record #[id number]  History of Present Illness: Karl Stevenson is seen today for a one week check. He is seen for Dr. Excell Seltzer. He has multiple issues which include CAD with 2 prior CABGs, first in 1987 and the 2nd in 2008. Has known PVD with multiple PV procedures. Other issues include AAA, HTN, and HLD. Last stress test in February of 2013. EF is 40%. Has a history of bradycardia and tachycardia but has not required PTVP implant. No syncope. He will use prn propranolol if his heart rate is elevated. He has a known bifascicular block.   I saw him last week with some chest tightness. BP was up. He did not feel like it was his angina. We added Norvasc to his regimen.   He comes back today. He is here alone. Doing well. Says he has had a good week. No more chest pain. BP at home is in the 150's. He is quite happy with how he is doing. f  Current Outpatient Prescriptions on File Prior to Visit  Medication Sig Dispense Refill  . amLODipine (NORVASC) 5 MG tablet Take 1 tablet (5 mg total) by mouth daily.  30 tablet  6  . aspirin 81 MG tablet Take 81 mg by mouth daily.       [name] atorvastatin (LIPITOR) 10 MG tablet Take 10 mg by mouth at bedtime.       . clopidogrel (PLAVIX) 75 MG tablet TAKE ONE TABLET BY MOUTH EVERY DAY  90 tablet  2  . COLCRYS 0.6 MG tablet Take 0.6 mg by mouth daily as needed. For gout      . diphenhydrAMINE (BENADRYL) 25 mg capsule Take 25 mg by mouth daily.      . fexofenadine (ALLEGRA) 180 MG tablet Take 180 mg by mouth as needed.      [name] ipratropium (ATROVENT) 0.03 % nasal spray Place 1 spray into the nose daily.       . isosorbide mononitrate (IMDUR) 60 MG 24 hr tablet TAKE ONE TABLET BY MOUTH EVERY DAY  90 tablet  2  . Multiple Vitamin (MULITIVITAMIN WITH MINERALS) TABS Take 1 tablet by mouth every evening.       [name] NITROSTAT 0.4 MG SL tablet DISSOLVE ONE TABLET UNDER THE TONGUE EVERY 5 MINUTES FOR 3 DOSES AS NEEDED .  25 tablet   2  . pantoprazole (PROTONIX) 40 MG tablet Take 1 tablet (40 mg total) by mouth daily.  90 tablet  3  . ranolazine (RANEXA) 500 MG 12 hr tablet Take 500 mg by mouth 2 (two) times daily.      . valsartan (DIOVAN) 80 MG tablet Take 1 tablet (80 mg total) by mouth daily.  90 tablet  3    Allergies  Allergen Reactions  . Novocain (Procaine Hcl) Other (See Comments)    Cold sweats and headache.   . Metoprolol     Past Medical History  Diagnosis Date  . Hyperlipidemia   . Hypertension   . Carotid artery occlusion     s/p L CEA in 2010  . Crohn's disease     colon resection  . Bradycardia     previous intolerance to beta blockers - low dose toprol started 10/2011 for tachycardia but had rash and was discontinued  . Chronic systolic heart failure     echo 7/12: EF 35-40%, anteroseptal hypokinesis, mild MR.  . Ischemic cardiomyopathy     EF 34% (Lexiscan 06/2011), 35-40% (  echo 11/2010)  . Coronary artery disease     CABG 1987, redo 2008; Last LHC 1/10: SVG-OM 1/OM 2 patent, SVG-LAD patent, SVG-OM 3 Patent  . Peripheral vascular disease     Carotid dz s/p L CEA, RAS, AAA, LE dz  . Renal artery stenosis     Dopplers 12/2010 - 1-59% R ostial renal artery stenosis, 2 L renal arteries both widely patent  . Gouty arthritis   . AAA (abdominal aortic aneurysm)     Ectatic abdominal aorta with fusiform dilitation 3.4x3.4 and moderate thrombus burden 12/2010  . RBBB   . Tachycardia     a. 10/2011 - atrial tach vs avnrt - BB started.    Past Surgical History  Procedure Date  . Carotid endarterectomy 2010    left  . Colon resection   . Tonsillectomy     as a child  . Pr vein bypass graft,aorto-fem-pop 10/1985  . Coronary artery bypass graft 10/31/85 & 08/19/06  . Vein bypass surgery     LEFT   06/2011  . Endarterectomy 08/26/2011    Procedure: ENDARTERECTOMY ILIAC;  Surgeon: [name], MD;  Location: [facility] OR;  Service: Vascular;  Laterality: Right;  Right Femoral Artery Endarterectomy with vascu  guard patch angioplasty & intraoperative arteriogram.    History  Smoking status  . Never Smoker   Smokeless tobacco  . Never Used    History  Alcohol Use No    Family History  Problem Relation Age of Onset  . Heart disease Father   . Cancer Mother   . Anesthesia problems Neg Hx     Review of Systems: The review of systems is per the HPI.  All other systems were reviewed and are negative.  Physical Exam: BP 140/80  Pulse 64  Ht 5\' 7"  (1.702 m)  Wt 132 lb 12.8 oz (60.238 kg)  BMI 20.80 kg/m2 Patient is very pleasant and in no acute distress. Skin is warm and dry. Color is normal.  HEENT is unremarkable. Normocephalic/atraumatic. PERRL. Sclera are nonicteric. Neck is supple. No masses. No JVD. Lungs are clear. Cardiac exam shows a regular rate and rhythm. Abdomen is soft. Extremities are without edema. Gait and ROM are intact. No gross neurologic deficits noted.  LABORATORY DATA:  Lab Results  Component Value Date   WBC 7.1 01/30/2012   HGB 12.2* 01/30/2012   HCT 36.3* 01/30/2012   PLT 102* 01/30/2012   GLUCOSE 110* 04/19/2012   CHOL 87 10/18/2011   TRIG 83 10/18/2011   HDL 36* 10/18/2011   LDLCALC 34 10/18/2011   ALT 18 01/30/2012   AST 19 01/30/2012   NA 137 04/19/2012   K 3.9 04/19/2012   CL 103 04/19/2012   CREATININE 1.4 04/19/2012   BUN 22 04/19/2012   CO2 29 04/19/2012   TSH 1.930 10/17/2011   INR 1.03 08/12/2011     Assessment / Plan:  1. HTN - blood pressure looks better with the addition of Norvasc. I have asked him to continue to monitor at home. Will see him back in February for his regular visit.   2. CAD - with prior CABG and redo CABG. Negative Myoview back in February of 2013. No chest pain reported.   3. Tachy/brady - with bifascicular block - no syncope - not a candidate for beta blocker therapy.   4. PVD - has follow up at [facility] in February of 2014.   Overall, he looks good. Will see him in February. No change in  his regimen.   Patient is agreeable  to this plan and will call if any problems develop in the interim.

## 2012-04-26 NOTE — Patient Instructions (Addendum)
I think you are doing well  Stay on your current medicines  Bring your BP cuff the next time you come  I will see you in February  Call the [facility] office at [phone] if you have any questions, problems or concerns.

## 2012-06-03 ENCOUNTER — Other Ambulatory Visit: Payer: Self-pay | Admitting: *Deleted

## 2012-06-03 DIAGNOSIS — Z48812 Encounter for surgical aftercare following surgery on the circulatory system: Secondary | ICD-10-CM

## 2012-06-03 DIAGNOSIS — I6529 Occlusion and stenosis of unspecified carotid artery: Secondary | ICD-10-CM

## 2012-06-16 ENCOUNTER — Encounter: Payer: Self-pay | Admitting: Neurosurgery

## 2012-06-17 ENCOUNTER — Encounter: Payer: Self-pay | Admitting: Neurosurgery

## 2012-06-17 ENCOUNTER — Other Ambulatory Visit: Payer: Medicare FFS

## 2012-06-17 ENCOUNTER — Ambulatory Visit (INDEPENDENT_AMBULATORY_CARE_PROVIDER_SITE_OTHER): Payer: Medicare PPO | Admitting: Neurosurgery

## 2012-06-17 ENCOUNTER — Encounter (INDEPENDENT_AMBULATORY_CARE_PROVIDER_SITE_OTHER): Payer: Medicare PPO | Admitting: *Deleted

## 2012-06-17 ENCOUNTER — Ambulatory Visit: Payer: Medicare FFS | Admitting: Neurosurgery

## 2012-06-17 ENCOUNTER — Other Ambulatory Visit (INDEPENDENT_AMBULATORY_CARE_PROVIDER_SITE_OTHER): Payer: Medicare PPO | Admitting: *Deleted

## 2012-06-17 VITALS — BP 107/67 | HR 66 | Resp 16 | Ht 67.0 in | Wt 135.0 lb

## 2012-06-17 DIAGNOSIS — I6529 Occlusion and stenosis of unspecified carotid artery: Secondary | ICD-10-CM

## 2012-06-17 DIAGNOSIS — Z48812 Encounter for surgical aftercare following surgery on the circulatory system: Secondary | ICD-10-CM

## 2012-06-17 DIAGNOSIS — I70209 Unspecified atherosclerosis of native arteries of extremities, unspecified extremity: Secondary | ICD-10-CM

## 2012-06-17 DIAGNOSIS — I70203 Unspecified atherosclerosis of native arteries of extremities, bilateral legs: Secondary | ICD-10-CM

## 2012-06-17 DIAGNOSIS — I739 Peripheral vascular disease, unspecified: Secondary | ICD-10-CM

## 2012-06-17 NOTE — Progress Notes (Signed)
[facility] PAD/PVD Office Note  CC: PAD surveillance Referring Physician: Imogene Burn  History of Present Illness: 79 year old male patient of Dr. Imogene Burn status post bilateral iliofemoral endarterectomies in early 2013. The patient denies claudication or rest pain. The patient's only complaint is a seroma on his right groin that has been there since prior to surgery.  Past Medical History  Diagnosis Date  . Hyperlipidemia   . Hypertension   . Carotid artery occlusion     s/p L CEA in 2010  . Crohn's disease     colon resection  . Bradycardia     previous intolerance to beta blockers - low dose toprol started 10/2011 for tachycardia but had rash and was discontinued  . Chronic systolic heart failure     echo 7/12: EF 35-40%, anteroseptal hypokinesis, mild MR.  . Ischemic cardiomyopathy     EF 34% (Lexiscan 06/2011), 35-40% (echo 11/2010)  . Coronary artery disease     CABG 1987, redo 2008; Last LHC 1/10: SVG-OM 1/OM 2 patent, SVG-LAD patent, SVG-OM 3 Patent  . Peripheral vascular disease     Carotid dz s/p L CEA, RAS, AAA, LE dz  . Renal artery stenosis     Dopplers 12/2010 - 1-59% R ostial renal artery stenosis, 2 L renal arteries both widely patent  . Gouty arthritis   . AAA (abdominal aortic aneurysm)     Ectatic abdominal aorta with fusiform dilitation 3.4x3.4 and moderate thrombus burden 12/2010  . RBBB   . Tachycardia     a. 10/2011 - atrial tach vs avnrt - BB started.  . Myocardial infarction     ROS: [x]  Positive   [ ]  Denies    General: [ ]  Weight loss, [ ]  Fever, [ ]  chills Neurologic: [ ]  Dizziness, [ ]  Blackouts, [ ]  Seizure [ ]  Stroke, [ ]  "Mini stroke", [ ]  Slurred speech, [ ]  Temporary blindness; [ ]  weakness in arms or legs, [ ]  Hoarseness Cardiac: [ ]  Chest pain/pressure, [ ]  Shortness of breath at rest [ ]  Shortness of breath with exertion, [ ]  Atrial fibrillation or irregular heartbeat Vascular: [ ]  Pain in legs with walking, [ ]  Pain in  legs at rest, [ ]  Pain in legs at night,  [ ]  Non-healing ulcer, [ ]  Blood clot in vein/DVT,   Pulmonary: [ ]  Home oxygen, [ ]  Productive cough, [ ]  Coughing up blood, [ ]  Asthma,  [ ]  Wheezing Musculoskeletal:  [ ]  Arthritis, [ ]  Low back pain, [ ]  Joint pain Hematologic: [ ]  Easy Bruising, [ ]  Anemia; [ ]  Hepatitis Gastrointestinal: [ ]  Blood in stool, [ ]  Gastroesophageal Reflux/heartburn, [ ]  Trouble swallowing Urinary: [ ]  chronic Kidney disease, [ ]  on HD - [ ]  MWF or [ ]  TTHS, [ ]  Burning with urination, [ ]  Difficulty urinating Skin: [ ]  Rashes, [ ]  Wounds Psychological: [ ]  Anxiety, [ ]  Depression   Social History History  Substance Use Topics  . Smoking status: Never Smoker   . Smokeless tobacco: Never Used  . Alcohol Use: No    Family History Family History  Problem Relation Age of Onset  . Heart disease Father   . Cancer Mother   . Anesthesia problems Neg Hx   . Deep vein thrombosis Brother     Allergies  Allergen Reactions  . Novocain (Procaine Hcl) Other (See Comments)    Cold sweats and headache.   . Metoprolol     Current Outpatient  Prescriptions  Medication Sig Dispense Refill  . amLODipine (NORVASC) 5 MG tablet Take 1 tablet (5 mg total) by mouth daily.  30 tablet  6  . aspirin 81 MG tablet Take 81 mg by mouth daily.       [name] atorvastatin (LIPITOR) 10 MG tablet Take 10 mg by mouth at bedtime.       . clopidogrel (PLAVIX) 75 MG tablet TAKE ONE TABLET BY MOUTH EVERY DAY  90 tablet  2  . COLCRYS 0.6 MG tablet Take 0.6 mg by mouth daily as needed. For gout      . fexofenadine (ALLEGRA) 180 MG tablet Take 180 mg by mouth as needed.      [name] ipratropium (ATROVENT) 0.03 % nasal spray Place 1 spray into the nose daily.       . isosorbide mononitrate (IMDUR) 60 MG 24 hr tablet TAKE ONE TABLET BY MOUTH EVERY DAY  90 tablet  2  . Multiple Vitamin (MULITIVITAMIN WITH MINERALS) TABS Take 1 tablet by mouth every evening.       [name] NITROSTAT 0.4 MG SL tablet DISSOLVE ONE  TABLET UNDER THE TONGUE EVERY 5 MINUTES FOR 3 DOSES AS NEEDED .  25 tablet  2  . pantoprazole (PROTONIX) 40 MG tablet Take 1 tablet (40 mg total) by mouth daily.  90 tablet  3  . ranolazine (RANEXA) 500 MG 12 hr tablet Take 500 mg by mouth 2 (two) times daily.      . valsartan (DIOVAN) 80 MG tablet Take 1 tablet (80 mg total) by mouth daily.  90 tablet  3  . Vitamins-Lipotropics (LIPO-FLAVONOID PLUS PO) Take by mouth 2 (two) times daily.      . diphenhydrAMINE (BENADRYL) 25 mg capsule Take 25 mg by mouth daily.        Physical Examination  Filed Vitals:   06/17/12 1132  BP: 107/67  Pulse: 66  Resp:     Body mass index is 21.14 kg/(m^2).  General:  WDWN in NAD Gait: Normal HEENT: WNL Eyes: Pupils equal Pulmonary: normal non-labored breathing , without Rales, rhonchi,  wheezing Cardiac: RRR, without  Murmurs, rubs or gallops; No carotid bruits Abdomen: soft, NT, no masses Skin: no rashes, ulcers noted Vascular Exam/Pulses: 3+ radial pulses bilaterally, palpable femoral pulses bilaterally, the patient does have palpable lower extremity pulse  Extremities without ischemic changes, no Gangrene , no cellulitis; no open wounds;  Musculoskeletal: no muscle wasting or atrophy  Neurologic: A&O X 3; Appropriate Affect ; SENSATION: normal; MOTOR FUNCTION:  moving all extremities equally. Speech is fluent/normal  Non-Invasive Vascular Imaging: ABIs today are 1.14 triphasic on the right, 7 triphasic on the left which is consistent with previous exam one year ago, the patient has no carotid stenosis bilaterally  ASSESSMENT/PLAN: Asymptomatic patient will followup in one year with repeat ABIs and lower extremity duplex as well as carotid duplex. The patient's questions were encouraged and answered, he is in agreement with this plan.  Lauree Chandler ANP  Clinic M.D.: Imogene Burn

## 2012-06-17 NOTE — Addendum Note (Signed)
Addended by: Sharee Pimple on: 06/17/2012 04:04 PM   Modules accepted: Orders

## 2012-06-20 ENCOUNTER — Ambulatory Visit (INDEPENDENT_AMBULATORY_CARE_PROVIDER_SITE_OTHER): Payer: Medicare PPO | Admitting: Nurse Practitioner

## 2012-06-20 ENCOUNTER — Encounter: Payer: Self-pay | Admitting: Nurse Practitioner

## 2012-06-20 VITALS — BP 118/66 | HR 76 | Ht 67.0 in | Wt 135.0 lb

## 2012-06-20 DIAGNOSIS — I10 Essential (primary) hypertension: Secondary | ICD-10-CM

## 2012-06-20 DIAGNOSIS — I259 Chronic ischemic heart disease, unspecified: Secondary | ICD-10-CM

## 2012-06-20 NOTE — Patient Instructions (Addendum)
Continue with your current medicines.  Monitor your blood pressure at home.   Record your readings and bring to your next visit.  Limit sodium intake.  We will see you back in 4 months  Call the [facility] office at [phone] if you have any questions, problems or concerns.

## 2012-06-20 NOTE — Progress Notes (Signed)
Karl Stevenson Date of Birth: 23-Apr-1927 Medical Record #[id number]  History of Present Illness: Karl Stevenson is seen back today for a 2 month check. He is seen for Karl Stevenson. He has multiple issues which include CAD with 2 prior CABGs, first in 1987 and the 2nd in 2008. Has known PVD with multiple PV procedures. Other issues include AAA, HTN, and HLD. Last stress test in February of 2013. EF was 40%. Other issues include bradycardia and tachycardia with a bifascicular block  but has not required PTVP implant. He uses prn propranolol if his heart rate is elevated.   I saw him 2 months ago. He was doing well. Had had some chest pain prior to that visit - BP was up. We adjusted his medicines and he was did well.   He comes in today. He is here alone. He is doing well. Had his labs back in December with Karl Stevenson - these are reviewed today and were satisfactory. Did have an elevated uric acid level. Given cholchrys and allopurinol (but not started the allopurinol). No chest pain. Not short of breath. Remains active. Says he will have some "muscle tightness" in his chest when he gets anxious - which he attributes to his wife/marriage. Only used NTG once since his last visit here in December. No exertional symptoms. Uses prn Inderal if his heart rate is elevated with good results. Got a good report from [facility] and goes back there in a year. Overall, he is doing well and is happy.   Current Outpatient Prescriptions on File Prior to Visit  Medication Sig Dispense Refill  . amLODipine (NORVASC) 5 MG tablet Take 1 tablet (5 mg total) by mouth daily.  30 tablet  6  . aspirin 81 MG tablet Take 81 mg by mouth daily.       [name] atorvastatin (LIPITOR) 10 MG tablet Take 10 mg by mouth at bedtime.       . clopidogrel (PLAVIX) 75 MG tablet TAKE ONE TABLET BY MOUTH EVERY DAY  90 tablet  2  . COLCRYS 0.6 MG tablet Take 0.6 mg by mouth daily as needed. For gout      . fexofenadine (ALLEGRA) 180 MG tablet Take 180 mg  by mouth as needed.      [name] ipratropium (ATROVENT) 0.03 % nasal spray Place 1 spray into the nose daily.       . isosorbide mononitrate (IMDUR) 60 MG 24 hr tablet TAKE ONE TABLET BY MOUTH EVERY DAY  90 tablet  2  . Multiple Vitamin (MULITIVITAMIN WITH MINERALS) TABS Take 1 tablet by mouth every evening.       [name] NITROSTAT 0.4 MG SL tablet DISSOLVE ONE TABLET UNDER THE TONGUE EVERY 5 MINUTES FOR 3 DOSES AS NEEDED .  25 tablet  2  . pantoprazole (PROTONIX) 40 MG tablet Take 1 tablet (40 mg total) by mouth daily.  90 tablet  3  . ranolazine (RANEXA) 500 MG 12 hr tablet Take 500 mg by mouth 2 (two) times daily.      . valsartan (DIOVAN) 80 MG tablet Take 1 tablet (80 mg total) by mouth daily.  90 tablet  3  . Vitamins-Lipotropics (LIPO-FLAVONOID PLUS PO) Take by mouth 2 (two) times daily.       No current facility-administered medications on file prior to visit.    Allergies  Allergen Reactions  . Novocain (Procaine Hcl) Other (See Comments)    Cold sweats and headache.   . Metoprolol  Past Medical History  Diagnosis Date  . Hyperlipidemia   . Hypertension   . Carotid artery occlusion     s/p L CEA in 2010  . Crohn's disease     colon resection  . Bradycardia     previous intolerance to beta blockers - low dose toprol started 10/2011 for tachycardia but had rash and was discontinued  . Chronic systolic heart failure     echo 7/12: EF 35-40%, anteroseptal hypokinesis, mild MR.  . Ischemic cardiomyopathy     EF 34% (Lexiscan 06/2011), 35-40% (echo 11/2010)  . Coronary artery disease     CABG 1987, redo 2008; Last LHC 1/10: SVG-OM 1/OM 2 patent, SVG-LAD patent, SVG-OM 3 Patent  . Peripheral vascular disease     Carotid dz s/p L CEA, RAS, AAA, LE dz  . Renal artery stenosis     Dopplers 12/2010 - 1-59% R ostial renal artery stenosis, 2 L renal arteries both widely patent  . Gouty arthritis   . AAA (abdominal aortic aneurysm)     Ectatic abdominal aorta with fusiform dilitation 3.4x3.4  and moderate thrombus burden 12/2010  . RBBB   . Tachycardia     a. 10/2011 - atrial tach vs avnrt - BB started.  . Myocardial infarction     Past Surgical History  Procedure Laterality Date  . Carotid endarterectomy  2010    left  . Colon resection    . Tonsillectomy      as a child  . Pr vein bypass graft,aorto-fem-pop  10/1985  . Coronary artery bypass graft  10/31/85 & 08/19/06  . Vein bypass surgery      LEFT   06/2011  . Endarterectomy  08/26/2011    Procedure: ENDARTERECTOMY ILIAC;  Surgeon: Karl Hertz, MD;  Location: [facility] OR;  Service: Vascular;  Laterality: Right;  Right Femoral Artery Endarterectomy with vascu guard patch angioplasty & intraoperative arteriogram.    History  Smoking status  . Never Smoker   Smokeless tobacco  . Never Used    History  Alcohol Use No    Family History  Problem Relation Age of Onset  . Heart disease Father   . Cancer Mother   . Anesthesia problems Neg Hx   . Deep vein thrombosis Brother     Review of Systems: The review of systems is per the HPI.  All other systems were reviewed and are negative.  Physical Exam: BP 118/66  Pulse 76  Ht 5\' 7"  (1.702 m)  Wt 135 lb (61.236 kg)  BMI 21.14 kg/m2 His machine is 142/77. BP by me is 140/80.  Patient is very pleasant and in no acute distress. He looks younger than his stated age. Skin is warm and dry. Color is normal.  HEENT is unremarkable. Normocephalic/atraumatic. PERRL. Sclera are nonicteric. Neck is supple. No masses. No JVD. Lungs are clear. Cardiac exam shows a regular rate and rhythm. Abdomen is soft. Extremities are without edema. Gait and ROM are intact. No gross neurologic deficits noted.   LABORATORY DATA: Reviewed from Dr. Silvano Stevenson office.   Lab Results  Component Value Date   WBC 7.1 01/30/2012   HGB 12.2* 01/30/2012   HCT 36.3* 01/30/2012   PLT 102* 01/30/2012   GLUCOSE 110* 04/19/2012   CHOL 87 10/18/2011   TRIG 83 10/18/2011   HDL 36* 10/18/2011   LDLCALC 34 10/18/2011     ALT 18 01/30/2012   AST 19 01/30/2012   NA 137 04/19/2012   K 3.9 04/19/2012  CL 103 04/19/2012   CREATININE 1.4 04/19/2012   BUN 22 04/19/2012   CO2 29 04/19/2012   TSH 1.930 10/17/2011   INR 1.03 08/12/2011    Assessment / Plan:  1. HTN - Satisfactory control. His cuff correlates fairly well. No change in his current regimen.   2. CAD - prior CABG and redo CABG - Negative Myoview a year ago. Managed medically. No reports of angina. He feels like his "muscle tightness" is more an anxiety equivalent. He is not using NTG regularly. He has no exertional symptoms. If he has any change in symptoms, he will let us know.   3. Tachybrady with bifascicular block - no syncope - not a candidate for beta blocker therapy - uses prn Inderal with good results.   4. PVD - followed by [facility]  No change in his current regimen. Will see him back in 4 months. Encouraged him to stay active.   Patient is agreeable to this plan and will call if any problems develop in the interim.

## 2012-06-21 ENCOUNTER — Ambulatory Visit: Payer: Medicare PPO | Admitting: Cardiovascular Disease

## 2012-07-03 ENCOUNTER — Encounter (HOSPITAL_COMMUNITY): Payer: Self-pay | Admitting: Emergency Medicine

## 2012-07-03 ENCOUNTER — Telehealth: Payer: Self-pay | Admitting: Surgery

## 2012-07-03 ENCOUNTER — Emergency Department (HOSPITAL_COMMUNITY)
Admission: EM | Admit: 2012-07-03 | Discharge: 2012-07-03 | Disposition: A | Discharge status: Home / Self Care | Payer: Medicare PPO | Attending: Emergency Medicine | Admitting: Emergency Medicine

## 2012-07-03 DIAGNOSIS — Z7982 Long term (current) use of aspirin: Secondary | ICD-10-CM | POA: Insufficient documentation

## 2012-07-03 DIAGNOSIS — Z8679 Personal history of other diseases of the circulatory system: Secondary | ICD-10-CM | POA: Insufficient documentation

## 2012-07-03 DIAGNOSIS — E785 Hyperlipidemia, unspecified: Secondary | ICD-10-CM | POA: Insufficient documentation

## 2012-07-03 DIAGNOSIS — I10 Essential (primary) hypertension: Secondary | ICD-10-CM | POA: Insufficient documentation

## 2012-07-03 DIAGNOSIS — I252 Old myocardial infarction: Secondary | ICD-10-CM | POA: Insufficient documentation

## 2012-07-03 DIAGNOSIS — Z79899 Other long term (current) drug therapy: Secondary | ICD-10-CM | POA: Insufficient documentation

## 2012-07-03 DIAGNOSIS — I5022 Chronic systolic (congestive) heart failure: Secondary | ICD-10-CM | POA: Insufficient documentation

## 2012-07-03 DIAGNOSIS — Z8719 Personal history of other diseases of the digestive system: Secondary | ICD-10-CM | POA: Insufficient documentation

## 2012-07-03 DIAGNOSIS — Z951 Presence of aortocoronary bypass graft: Secondary | ICD-10-CM | POA: Insufficient documentation

## 2012-07-03 DIAGNOSIS — I2510 Atherosclerotic heart disease of native coronary artery without angina pectoris: Secondary | ICD-10-CM | POA: Insufficient documentation

## 2012-07-03 DIAGNOSIS — M109 Gout, unspecified: Secondary | ICD-10-CM | POA: Insufficient documentation

## 2012-07-03 DIAGNOSIS — M25579 Pain in unspecified ankle and joints of unspecified foot: Secondary | ICD-10-CM | POA: Insufficient documentation

## 2012-07-03 DIAGNOSIS — M25476 Effusion, unspecified foot: Secondary | ICD-10-CM | POA: Insufficient documentation

## 2012-07-03 DIAGNOSIS — M25473 Effusion, unspecified ankle: Secondary | ICD-10-CM | POA: Insufficient documentation

## 2012-07-03 NOTE — ED Provider Notes (Signed)
Medical screening examination/treatment/procedure(s) were conducted as a shared visit with non-physician practitioner(s) and myself.  I personally evaluated the patient during the encounter He has mild swelling over the right lateral malleolus without redness, heat, or tenderness.  He has a good DP pulse in the right foot.  No right calf swelling or tenderness.  Doubt significant arterial or venous problem causing his right ankle swelling.  OK with him to have F/U at [facility] tomorrow.  Carleene Cooper III, MD 07/03/12 [phone]

## 2012-07-03 NOTE — ED Provider Notes (Signed)
History     CSN: [id number]  Arrival date & time 07/03/12  1322   First MD Initiated Contact with Patient 07/03/12 1438      Chief Complaint  Patient presents with  . Joint Swelling    (Consider location/radiation/quality/duration/timing/severity/associated sxs/prior treatment) Patient is a 79 y.o. male presenting with ankle pain. The history is provided by the patient and the spouse.  Ankle Pain Location:  Ankle Injury: no   Ankle location:  R ankle Pain details:    Quality: There is no ankle pain. There is only a complaint of swelling.   Severity:  Mild Associated symptoms: no fever   Associated symptoms comment:  Right ankle swelling that was present when he woke this morning. No pain or injury. He became concerned because he has a history of arterial occlucions requiring surgical intervention by Dr. Imogene Burn, per patient. He does not have any numbness, tingling, calf or thigh discomfort.    Past Medical History  Diagnosis Date  . Hyperlipidemia   . Hypertension   . Carotid artery occlusion     s/p L CEA in 2010  . Crohn's disease     colon resection  . Bradycardia     previous intolerance to beta blockers - low dose toprol started 10/2011 for tachycardia but had rash and was discontinued  . Chronic systolic heart failure     echo 7/12: EF 35-40%, anteroseptal hypokinesis, mild MR.  . Ischemic cardiomyopathy     EF 34% (Lexiscan 06/2011), 35-40% (echo 11/2010)  . Coronary artery disease     CABG 1987, redo 2008; Last LHC 1/10: SVG-OM 1/OM 2 patent, SVG-LAD patent, SVG-OM 3 Patent  . Peripheral vascular disease     Carotid dz s/p L CEA, RAS, AAA, LE dz  . Renal artery stenosis     Dopplers 12/2010 - 1-59% R ostial renal artery stenosis, 2 L renal arteries both widely patent  . Gouty arthritis   . AAA (abdominal aortic aneurysm)     Ectatic abdominal aorta with fusiform dilitation 3.4x3.4 and moderate thrombus burden 12/2010  . RBBB   . Tachycardia     a. 10/2011 - atrial  tach vs avnrt - BB started.  . Myocardial infarction     Past Surgical History  Procedure Laterality Date  . Carotid endarterectomy  2010    left  . Colon resection    . Tonsillectomy      as a child  . Pr vein bypass graft,aorto-fem-pop  10/1985  . Coronary artery bypass graft  10/31/85 & 08/19/06  . Vein bypass surgery      LEFT   06/2011  . Endarterectomy  08/26/2011    Procedure: ENDARTERECTOMY ILIAC;  Surgeon: [name], MD;  Location: [facility] OR;  Service: Vascular;  Laterality: Right;  Right Femoral Artery Endarterectomy with vascu guard patch angioplasty & intraoperative arteriogram.    Family History  Problem Relation Age of Onset  . Heart disease Father   . Cancer Mother   . Anesthesia problems Neg Hx   . Deep vein thrombosis Brother     History  Substance Use Topics  . Smoking status: Never Smoker   . Smokeless tobacco: Never Used  . Alcohol Use: No      Review of Systems  Constitutional: Negative for fever.  Musculoskeletal:       See HPI.  Skin: Negative for color change, pallor and rash.  Neurological: Negative for numbness.    Allergies  Novocain and Metoprolol  Home  Medications   Current Outpatient Rx  Name  Route  Sig  Dispense  Refill  . amLODipine (NORVASC) 5 MG tablet   Oral   Take 1 tablet (5 mg total) by mouth daily.   30 tablet   6   . aspirin 81 MG tablet   Oral   Take 81 mg by mouth daily.          [name] atorvastatin (LIPITOR) 10 MG tablet   Oral   Take 10 mg by mouth at bedtime.          . clopidogrel (PLAVIX) 75 MG tablet   Oral   Take 75 mg by mouth daily.         [name] COLCRYS 0.6 MG tablet   Oral   Take 0.6 mg by mouth daily as needed. For gout         . fexofenadine (ALLEGRA) 180 MG tablet   Oral   Take 180 mg by mouth daily as needed (seasonal allergies).          [name] ipratropium (ATROVENT) 0.03 % nasal spray   Nasal   Place 1 spray into the nose daily.          . isosorbide mononitrate (IMDUR) 60 MG 24 hr  tablet   Oral   Take 60 mg by mouth daily.         . Multiple Vitamin (MULITIVITAMIN WITH MINERALS) TABS   Oral   Take 1 tablet by mouth every evening.          . nitroGLYCERIN (NITROSTAT) 0.4 MG SL tablet   Sublingual   Place 0.4 mg under the tongue every 5 (five) minutes as needed for chest pain.         . pantoprazole (PROTONIX) 40 MG tablet   Oral   Take 1 tablet (40 mg total) by mouth daily.   90 tablet   3   . ranolazine (RANEXA) 500 MG 12 hr tablet   Oral   Take 500 mg by mouth 2 (two) times daily.         . valsartan (DIOVAN) 80 MG tablet   Oral   Take 1 tablet (80 mg total) by mouth daily.   90 tablet   3   . Vitamins-Lipotropics (LIPO-FLAVONOID PLUS PO)   Oral   Take 1 tablet by mouth 2 (two) times daily.            BP 106/76  Pulse 84  Temp(Src) 98 F (36.7 C) (Oral)  Resp 16  SpO2 98%  Physical Exam  Constitutional: He is oriented to person, place, and time. He appears well-developed and well-nourished. No distress.  Cardiovascular: Intact distal pulses.   Pulmonary/Chest: Effort normal.  Musculoskeletal: Normal range of motion. He exhibits no edema.  Right leg unremarkable in appearance. Mild swelling right ankle laterally. No discoloration, tenderness or joint laxity. No swelling to the foot. Calf, thigh non-tender.  Neurological: He is alert and oriented to person, place, and time.  Skin: Skin is warm. No rash noted. No erythema.  Psychiatric: He has a normal mood and affect.    ED Course  Procedures (including critical care time)  Labs Reviewed - No data to display No results found.   No diagnosis found.  1. Ankle swelling  MDM  Discussed with Dr. Myra Gianotti who advises patient can be seen in the office tomorrow. No further evaluation required based on reported exam. Dr. Ignacia Palma has seen patient and agrees.  Arnoldo Hooker, PA-C 07/03/12 1525

## 2012-07-03 NOTE — ED Notes (Signed)
Pt. Stated, i woke up this am and my rt. Ankle was swollen, NO pain or injury

## 2012-07-03 NOTE — Telephone Encounter (Signed)
Patient called with new onset swelling in his right leg.  He has a histor of bilateral femoral endarterectomies last year by Dr. Imogene Burn.  He also complains of numbness in his right leg.  I advised him to goto the ER to rule out DVT

## 2012-07-21 ENCOUNTER — Telehealth: Payer: Self-pay | Admitting: Cardiovascular Disease

## 2012-07-21 NOTE — Telephone Encounter (Signed)
New Problem:    Patient called in wanting to speak with you because he is having BP and HR issues, and had some chest pains.  Patient is not having any chest pain at the moment.  Please call back.

## 2012-07-21 NOTE — Telephone Encounter (Signed)
I spoke with the pt and he went to the beach for a few days but came home early on Tuesday due to chest discomfort.  The pt complained of constant chest discomfort on Monday and Tuesday, the pt rated discomfort as 5/10. The pt did take 3 NTG Tuesday and he did not get complete resolution of symptoms.  When the pt got home from the beach he checked his vitals and SBP was 198, pulse 110.  The pt took an extra Diovan and an Inderal and his numbers decreased.  Since this event the pt has had a few pains in the center of his chest and his SBP is running around 160. The pt does still complain of discomfort with movement and inspiration. I scheduled the pt to see Dr Excell Seltzer on 07/22/12.

## 2012-07-22 ENCOUNTER — Ambulatory Visit (INDEPENDENT_AMBULATORY_CARE_PROVIDER_SITE_OTHER): Payer: Medicare PPO | Admitting: Cardiovascular Disease

## 2012-07-22 ENCOUNTER — Encounter: Payer: Self-pay | Admitting: Cardiovascular Disease

## 2012-07-22 VITALS — BP 138/72 | HR 70 | Ht 67.0 in | Wt 136.0 lb

## 2012-07-22 DIAGNOSIS — I2510 Atherosclerotic heart disease of native coronary artery without angina pectoris: Secondary | ICD-10-CM

## 2012-07-22 NOTE — Patient Instructions (Addendum)
Your physician has recommended you make the following change in your medication: INCREASE Isosorbide Mononitrate to 60mg  take one by mouth twice a day  Your physician has requested that you regularly monitor and record your blood pressure readings at home. Please use the same machine at the same time of day to check your readings and record them to bring to your follow-up visit. IF YOUR SBP IS GREATER THAN 180 PLEASE TAKE AN AMLODIPINE 5MG , AFTER ONE HOUR IF YOUR BP REMAINS ELEVATED PLEASE TAKE AN INDERAL  Your physician recommends that you schedule a follow-up appointment in: 2 WEEKS with Norma Fredrickson NP  You can take Tylenol (over the counter) as needed for muscle pain.  Please follow instructions on the bottle.

## 2012-07-25 ENCOUNTER — Other Ambulatory Visit: Payer: Self-pay

## 2012-07-25 DIAGNOSIS — R609 Edema, unspecified: Secondary | ICD-10-CM

## 2012-07-26 ENCOUNTER — Encounter: Payer: Self-pay | Admitting: Cardiovascular Disease

## 2012-07-26 NOTE — Progress Notes (Signed)
HPI:  [name] returns for follow-up evaluation. He has extensive CAD with redo CABG in 2008. Also has PAD with revascularization surgery. Most recently seen by Karl Stevenson in February.  He came back early from the beach because of chest pains. He describes a 'muscle tightness' across his chest with associated dyspnea. Denies any new activities or muscle strain. Denies other associated symptoms. Pain is non-exertional, but does note that NTG gives some relief.  Outpatient Encounter Prescriptions as of 07/22/2012  Medication Sig Dispense Refill  . amLODipine (NORVASC) 5 MG tablet Take 1 tablet (5 mg total) by mouth daily.  30 tablet  6  . aspirin 81 MG tablet Take 81 mg by mouth daily.       [name] atorvastatin (LIPITOR) 10 MG tablet Take 10 mg by mouth at bedtime.       . clopidogrel (PLAVIX) 75 MG tablet Take 75 mg by mouth daily.      [name] COLCRYS 0.6 MG tablet Take 0.6 mg by mouth daily as needed. For gout      . fexofenadine (ALLEGRA) 180 MG tablet Take 180 mg by mouth daily as needed (seasonal allergies).       [name] ipratropium (ATROVENT) 0.03 % nasal spray Place 1 spray into the nose daily.       . isosorbide mononitrate (IMDUR) 60 MG 24 hr tablet Take 1 tablet (60 mg total) by mouth 2 (two) times daily.      . Multiple Vitamin (MULITIVITAMIN WITH MINERALS) TABS Take 1 tablet by mouth every evening.       . nitroGLYCERIN (NITROSTAT) 0.4 MG SL tablet Place 0.4 mg under the tongue every 5 (five) minutes as needed for chest pain.      . pantoprazole (PROTONIX) 40 MG tablet Take 1 tablet (40 mg total) by mouth daily.  90 tablet  3  . ranolazine (RANEXA) 500 MG 12 hr tablet Take 500 mg by mouth 2 (two) times daily.      . valsartan (DIOVAN) 80 MG tablet Take 1 tablet (80 mg total) by mouth daily.  90 tablet  3  . Vitamins-Lipotropics (LIPO-FLAVONOID PLUS PO) Take 1 tablet by mouth 2 (two) times daily.       . [DISCONTINUED] isosorbide mononitrate (IMDUR) 60 MG 24 hr tablet Take 60 mg by mouth daily.        No facility-administered encounter medications on file as of 07/22/2012.    Allergies  Allergen Reactions  . Novocain (Procaine Hcl) Other (See Comments)    Cold sweats and headache.   . Metoprolol     Past Medical History  Diagnosis Date  . Hyperlipidemia   . Hypertension   . Carotid artery occlusion     s/p L CEA in 2010  . Crohn's disease     colon resection  . Bradycardia     previous intolerance to beta blockers - low dose toprol started 10/2011 for tachycardia but had rash and was discontinued  . Chronic systolic heart failure     echo 7/12: EF 35-40%, anteroseptal hypokinesis, mild MR.  . Ischemic cardiomyopathy     EF 34% (Lexiscan 06/2011), 35-40% (echo 11/2010)  . Coronary artery disease     CABG 1987, redo 2008; Last LHC 1/10: SVG-OM 1/OM 2 patent, SVG-LAD patent, SVG-OM 3 Patent  . Peripheral vascular disease     Carotid dz s/p L CEA, RAS, AAA, LE dz  . Renal artery stenosis     Dopplers 12/2010 - 1-59% R ostial  renal artery stenosis, 2 L renal arteries both widely patent  . Gouty arthritis   . AAA (abdominal aortic aneurysm)     Ectatic abdominal aorta with fusiform dilitation 3.4x3.4 and moderate thrombus burden 12/2010  . RBBB   . Tachycardia     a. 10/2011 - atrial tach vs avnrt - BB started.  . Myocardial infarction   . COPD (chronic obstructive pulmonary disease)     ROS: Negative except as per HPI  BP 138/72  Pulse 70  Ht 5\' 7"  (1.702 m)  Wt 61.689 kg (136 lb)  BMI 21.3 kg/m2  SpO2 99%  PHYSICAL EXAM: Pt is alert and oriented, pleasant elderly male in NAD HEENT: normal Neck: JVP - normal, carotids 2+=  Lungs: CTA bilaterally CV: RRR without murmur or gallop Abd: soft, NT, Positive BS, no hepatomegaly Ext: no C/C/E Skin: warm/dry no rash  EKG:  NSR with RBBB, LAFB  ASSESSMENT AND PLAN: 1. CAD s/p CABG - chest pain worrisome for angina. I have recommended increasing Imdur to 60 mg BID. I would like him to see Karl Stevenson in close follow-up in 2  weeks. He will otherwise continue his same meds. We discussed consideration of cardiac cath but he was not inclined to pursue this yet.  2. HTN - reviewed home readings and they are labile. Many are within range but this week BP has been elevated. We discussed parameters for taking additional meds in case of periods of HTN - he will try an additional norvasc if SBP is greater than 180 bpm.  3. PAD - upcoming appt with Dr Karl Stevenson  4. Bifascicular block - no symptoms of lightheadedness or near-syncope. Appears stable.  [name] 07/26/2012 11:56 PM

## 2012-07-27 ENCOUNTER — Telehealth: Payer: Self-pay | Admitting: Physician Assistant

## 2012-07-27 ENCOUNTER — Encounter (HOSPITAL_COMMUNITY): Payer: Self-pay

## 2012-07-27 ENCOUNTER — Observation Stay (HOSPITAL_COMMUNITY)
Admission: EM | Admit: 2012-07-27 | Discharge: 2012-07-28 | Disposition: A | Discharge status: Home / Self Care | Payer: Medicare PPO | Attending: Cardiology | Admitting: Cardiology

## 2012-07-27 ENCOUNTER — Emergency Department (HOSPITAL_COMMUNITY): Payer: Medicare PPO

## 2012-07-27 DIAGNOSIS — I6529 Occlusion and stenosis of unspecified carotid artery: Secondary | ICD-10-CM | POA: Diagnosis present

## 2012-07-27 DIAGNOSIS — E785 Hyperlipidemia, unspecified: Secondary | ICD-10-CM | POA: Insufficient documentation

## 2012-07-27 DIAGNOSIS — J4489 Other specified chronic obstructive pulmonary disease: Secondary | ICD-10-CM | POA: Insufficient documentation

## 2012-07-27 DIAGNOSIS — R079 Chest pain, unspecified: Principal | ICD-10-CM | POA: Insufficient documentation

## 2012-07-27 DIAGNOSIS — I200 Unstable angina: Secondary | ICD-10-CM | POA: Diagnosis present

## 2012-07-27 DIAGNOSIS — I5022 Chronic systolic (congestive) heart failure: Secondary | ICD-10-CM | POA: Insufficient documentation

## 2012-07-27 DIAGNOSIS — I2510 Atherosclerotic heart disease of native coronary artery without angina pectoris: Secondary | ICD-10-CM | POA: Insufficient documentation

## 2012-07-27 DIAGNOSIS — I10 Essential (primary) hypertension: Secondary | ICD-10-CM | POA: Insufficient documentation

## 2012-07-27 DIAGNOSIS — I739 Peripheral vascular disease, unspecified: Secondary | ICD-10-CM | POA: Diagnosis present

## 2012-07-27 DIAGNOSIS — J449 Chronic obstructive pulmonary disease, unspecified: Secondary | ICD-10-CM | POA: Insufficient documentation

## 2012-07-27 DIAGNOSIS — I255 Ischemic cardiomyopathy: Secondary | ICD-10-CM | POA: Diagnosis present

## 2012-07-27 LAB — POCT I-STAT, CHEM 8
BUN: 23 mg/dL (ref 6–23)
Calcium, Ion: 1.24 mmol/L (ref 1.13–1.30)
Chloride: 105 mEq/L (ref 96–112)
Creatinine, Ser: 1.3 mg/dL (ref 0.50–1.35)
Glucose, Bld: 114 mg/dL — ABNORMAL HIGH (ref 70–99)
HCT: 36 % — ABNORMAL LOW (ref 39.0–52.0)
Hemoglobin: 12.2 g/dL — ABNORMAL LOW (ref 13.0–17.0)
Potassium: 4.6 mEq/L (ref 3.5–5.1)
Sodium: 141 mEq/L (ref 135–145)
TCO2: 29 mmol/L (ref 0–100)

## 2012-07-27 LAB — CBC
MCH: 33.4 pg (ref 26.0–34.0)
MCHC: 34.7 g/dL (ref 30.0–36.0)
MCV: 96.3 fL (ref 78.0–100.0)
Platelets: 136 10*3/uL — ABNORMAL LOW (ref 150–400)

## 2012-07-27 LAB — URINALYSIS, ROUTINE W REFLEX MICROSCOPIC
Glucose, UA: NEGATIVE mg/dL
Hgb urine dipstick: NEGATIVE
Protein, ur: NEGATIVE mg/dL

## 2012-07-27 LAB — POCT I-STAT TROPONIN I
Troponin i, poc: 0.03 ng/mL (ref 0.00–0.08)
Troponin i, poc: 0.01 ng/mL (ref 0.00–0.08)

## 2012-07-27 LAB — COMPREHENSIVE METABOLIC PANEL
ALT: 20 U/L (ref 0–53)
AST: 21 U/L (ref 0–37)
Calcium: 9.2 mg/dL (ref 8.4–10.5)
Creatinine, Ser: 1.34 mg/dL (ref 0.50–1.35)
GFR calc Af Amer: 55 mL/min — ABNORMAL LOW (ref >90)
GFR calc non Af Amer: 47 mL/min — ABNORMAL LOW (ref >90)
Sodium: 138 mEq/L (ref 135–145)
Total Protein: 6.4 g/dL (ref 6.0–8.3)

## 2012-07-27 MED ORDER — ASPIRIN 81 MG PO CHEW: 324.0000 mg | CHEWABLE_TABLET | Freq: Once | ORAL | Status: DC

## 2012-07-27 MED ORDER — ASPIRIN 81 MG PO CHEW
243.0000 mg | CHEWABLE_TABLET | Freq: Once | ORAL | Status: AC
  Administered 2012-07-27: 243 mg via ORAL
  Filled 2012-07-27: qty 3

## 2012-07-27 NOTE — ED Provider Notes (Signed)
History     CSN: [id number]  Arrival date & time 07/27/12  1924   First MD Initiated Contact with Patient 07/27/12 1936      Chief Complaint  Patient presents with  . Chest Pain    (Consider location/radiation/quality/duration/timing/severity/associated sxs/prior treatment) Patient is a 79 y.o. male presenting with chest pain. The history is provided by the patient, medical records and a relative. No language interpreter was used.  Chest Pain Associated symptoms: no abdominal pain, no back pain, no cough, no diaphoresis, no fatigue, no fever, no headache, no nausea, no shortness of breath and not vomiting     Karl Stevenson is a 79 y.o. male  with a hx of AAA, CAD, ischemic cardiomyopathy,  presents to the Emergency Department complaining of gradual, persistent, progressively worsening chest pain onset 4pm. Patient states he was walking around the house when his pain began but was not exerting himself. Patient took 3 nitroglycerin at home without relief and then called EMS. EMS gave a 2 more nitroglycerin with relief. Patient is also at 81 mg of aspirin.  Patient with a history of MI at age 82, cardiac cath with bypass in 1987 and revision in 2008.  Associated symptoms include radiation to the back.  Nitroglycerine makes it better and nothing makes it worse.  Pt denies fever, chills, headache, neck pain, shortness of breath, nausea, vomiting, diarrhea, diaphoresis, weakness, dizziness, syncope.    Record Review: Patient seen by cardiology on 07/22/2012 for evaluation of chest pain. That time they increased his Imdur and were considering a cardiac cath but decided on watchful waiting.    Past Medical History  Diagnosis Date  . Hyperlipidemia   . Hypertension   . Carotid artery occlusion     s/p L CEA in 2010  . Crohn's disease     colon resection  . Bradycardia     previous intolerance to beta blockers - low dose toprol started 10/2011 for tachycardia but had rash and was  discontinued  . Chronic systolic heart failure     echo 7/12: EF 35-40%, anteroseptal hypokinesis, mild MR.  . Ischemic cardiomyopathy     EF 34% (Lexiscan 06/2011), 35-40% (echo 11/2010)  . Coronary artery disease     CABG 1987, redo 2008; Last LHC 1/10: SVG-OM 1/OM 2 patent, SVG-LAD patent, SVG-OM 3 Patent  . Peripheral vascular disease     Carotid dz s/p L CEA, RAS, AAA, LE dz  . Renal artery stenosis     Dopplers 12/2010 - 1-59% R ostial renal artery stenosis, 2 L renal arteries both widely patent  . Gouty arthritis   . AAA (abdominal aortic aneurysm)     Ectatic abdominal aorta with fusiform dilitation 3.4x3.4 and moderate thrombus burden 12/2010  . RBBB   . Tachycardia     a. 10/2011 - atrial tach vs avnrt - BB started.  . Myocardial infarction   . COPD (chronic obstructive pulmonary disease)     Past Surgical History  Procedure Laterality Date  . Carotid endarterectomy  2010    left  . Colon resection    . Tonsillectomy      as a child  . Pr vein bypass graft,aorto-fem-pop  10/1985  . Vein bypass surgery      LEFT   06/2011  . Endarterectomy  08/26/2011    Procedure: ENDARTERECTOMY ILIAC;  Surgeon: [name], MD;  Location: [facility] OR;  Service: Vascular;  Laterality: Right;  Right Femoral Artery Endarterectomy with vascu guard patch  angioplasty & intraoperative arteriogram.  . Coronary artery bypass graft  10/31/85 & 08/19/06    Family History  Problem Relation Age of Onset  . Heart disease Father   . Cancer Mother   . COPD Mother   . Anesthesia problems Neg Hx   . Deep vein thrombosis Brother   . COPD Brother   . CAD Brother     History  Substance Use Topics  . Smoking status: Never Smoker   . Smokeless tobacco: Never Used  . Alcohol Use: No      Review of Systems  Constitutional: Negative for fever, diaphoresis, appetite change, fatigue and unexpected weight change.  HENT: Negative for mouth sores and neck stiffness.   Eyes: Negative for visual disturbance.   Respiratory: Negative for cough, chest tightness, shortness of breath and wheezing.   Cardiovascular: Positive for chest pain.  Gastrointestinal: Negative for nausea, vomiting, abdominal pain, diarrhea and constipation.  Endocrine: Negative for polydipsia, polyphagia and polyuria.  Genitourinary: Negative for dysuria, urgency, frequency and hematuria.  Musculoskeletal: Negative for back pain.  Skin: Negative for rash.  Allergic/Immunologic: Negative for immunocompromised state.  Neurological: Negative for syncope, light-headedness and headaches.  Hematological: Does not bruise/bleed easily.  Psychiatric/Behavioral: Negative for sleep disturbance. The patient is not nervous/anxious.     Allergies  Novocain and Metoprolol  Home Medications   Current Outpatient Rx  Name  Route  Sig  Dispense  Refill  . acetaminophen (TYLENOL) 500 MG tablet   Oral   Take 1,000 mg by mouth every 6 (six) hours as needed for pain.         [name] amLODipine (NORVASC) 5 MG tablet   Oral   Take 1 tablet (5 mg total) by mouth daily.   30 tablet   6   . aspirin 81 MG tablet   Oral   Take 81 mg by mouth daily.          [name] atorvastatin (LIPITOR) 10 MG tablet   Oral   Take 10 mg by mouth at bedtime.          . clopidogrel (PLAVIX) 75 MG tablet   Oral   Take 75 mg by mouth daily.         [name] COLCRYS 0.6 MG tablet   Oral   Take 0.6 mg by mouth daily as needed. For gout         . fexofenadine (ALLEGRA) 180 MG tablet   Oral   Take 180 mg by mouth daily as needed (seasonal allergies).          [name] ipratropium (ATROVENT) 0.03 % nasal spray   Nasal   Place 1 spray into the nose daily.          . isosorbide mononitrate (IMDUR) 60 MG 24 hr tablet   Oral   Take 1 tablet (60 mg total) by mouth 2 (two) times daily.         . Multiple Vitamin (MULITIVITAMIN WITH MINERALS) TABS   Oral   Take 1 tablet by mouth every evening.          . nitroGLYCERIN (NITROSTAT) 0.4 MG SL tablet    Sublingual   Place 0.4 mg under the tongue every 5 (five) minutes as needed for chest pain.         . pantoprazole (PROTONIX) 40 MG tablet   Oral   Take 1 tablet (40 mg total) by mouth daily.   90 tablet   3   . propranolol (INDERAL)  10 MG tablet   Oral   Take 10 mg by mouth 3 (three) times daily.         . ranolazine (RANEXA) 500 MG 12 hr tablet   Oral   Take 500 mg by mouth 2 (two) times daily.         . valsartan (DIOVAN) 80 MG tablet   Oral   Take 1 tablet (80 mg total) by mouth daily.   90 tablet   3     BP 115/71  Pulse 65  Temp(Src) 97.6 F (36.4 C) (Oral)  Resp 17  SpO2 99%  Physical Exam  Nursing note and vitals reviewed. Constitutional: He is oriented to person, place, and time. He appears well-developed and well-nourished. No distress.  HENT:  Head: Normocephalic and atraumatic.  Mouth/Throat: Oropharynx is clear and moist. No oropharyngeal exudate.  Eyes: Conjunctivae and EOM are normal. No scleral icterus.  Neck: Normal range of motion. Neck supple.  Cardiovascular: Normal rate, regular rhythm, normal heart sounds and intact distal pulses.  Exam reveals no gallop and no friction rub.   No murmur heard. Pulmonary/Chest: Effort normal and breath sounds normal. No respiratory distress. He has no wheezes. He has no rales. He exhibits no tenderness.  Abdominal: Soft. Bowel sounds are normal. He exhibits no distension and no mass. There is no tenderness. There is no rebound and no guarding.  Musculoskeletal: Normal range of motion. He exhibits no edema.  Lymphadenopathy:    He has no cervical adenopathy.  Neurological: He is alert and oriented to person, place, and time. No cranial nerve deficit. He exhibits normal muscle tone. Coordination normal.  Speech is clear and goal oriented Moves extremities without ataxia  Skin: Skin is warm and dry. No rash noted. He is not diaphoretic. No erythema.  Psychiatric: He has a normal mood and affect.    ED  Course  Procedures (including critical care time)  Labs Reviewed  CBC - Abnormal; Notable for the following:    RBC 3.74 (*)    Hemoglobin 12.5 (*)    HCT 36.0 (*)    Platelets 136 (*)    All other components within normal limits  COMPREHENSIVE METABOLIC PANEL - Abnormal; Notable for the following:    Glucose, Bld 120 (*)    GFR calc non Af Amer 47 (*)    GFR calc Af Amer 55 (*)    All other components within normal limits  POCT I-STAT, CHEM 8 - Abnormal; Notable for the following:    Glucose, Bld 114 (*)    Hemoglobin 12.2 (*)    HCT 36.0 (*)    All other components within normal limits  PROTIME-INR  APTT  URINALYSIS, ROUTINE W REFLEX MICROSCOPIC  POCT I-STAT TROPONIN I   Dg Chest 2 View  07/27/2012  *RADIOLOGY REPORT*  Clinical Data: Chest pain.  CHEST - 2 VIEW  Comparison: One-view chest 06/08/third  Findings: Pain heart size is normal.  The lungs are clear.  Remote left-sided rib fractures are seen.  Leftward curvature of the thoracic spine is stable.  IMPRESSION: No acute cardiopulmonary disease or significant interval change.   Original Report Authenticated By: Marin Roberts, M.D.     ECG:  Date: 07/27/2012  Rate: 73  Rhythm: normal sinus rhythm  QRS Axis: right  Intervals: normal  ST/T Wave abnormalities: nonspecific ST changes  Conduction Disutrbances:left anterior fascicular block  Narrative Interpretation: no STEMI, unchanged from previous  Old EKG Reviewed: unchanged   1. Unstable angina  2. RBBB   3. Peripheral vascular disease, unspecified   4. Ischemic cardiomyopathy   5. CAD (coronary artery disease)   6. HTN (hypertension)   7. Anemia       MDM  [name] presents for CP and symptoms are consistent with unstable angina.  Pt was recently seen by [facility] and a coronary cath was discussed.  Pt initial troponin negative, ECG nonischemic however based on patient history feel as if he warrants an overnight admission.  Concern for  cardiac etiology of Chest Pain. Cardiology has been consulted and will see patient in the ED for likely admit.  Pt has been re-evaluated prior to consult and VSS, NAD, heart RRR, pain 0/10, lungs CTAB. No acute abnormalities found on EKG and first round of cardiac enzymes negative. This case was discussed with Dr. Juleen China who has seen the patient and agrees with plan to admit.         Dahlia Client Arabela Basaldua, PA-C 07/27/12 [phone]

## 2012-07-27 NOTE — H&P (Signed)
History and Physical  Patient ID: Karl Stevenson MRN: [id number], SOB: [birth year] 79 y.o. Date of Encounter: 07/27/2012, 11:15 PM  Primary Physician: Garlan Fillers, MD Primary Cardiologist: Dr. Excell Seltzer  Chief Complaint: chest pain  HPI: 79 y.o. male w/ PMHx significant for CAD s/p MI and 2 CABGs, PVD s/p L CEA, R iliac endarterectomies, isch CMP EF 35-40%  who presented to [facility] on 07/27/2012 with complaints of chest pain.   He recently saw Dr. Excell Seltzer (3/14) and relayed to him regarding worsening burden of chest pain. Based upon his symptoms, Dr. Excell Seltzer increased his Imdur to 60 bid and added a prn amlodipine for elevated BPs. [name] reports taking the increased dose but today at 4:00 pm while working on the computer he had substernal chest pain that felt like a pressure. Took 3 nitro with some relief. Called [place] who recommended ER visit. Called 911 and pain resolved with the 2 nitro given by EMS (confirmed that his nitro are not expired).   Currently he is chest pain free and without complaints. Has taken it easy since seeing Dr. Excell Seltzer but is able to do his ADLs without limitation. No LE edema, no SOB, no PND.  EKG revealed NSR, LAFB and RBBB, anteroseptal Qs with no acute ST changes in unaffected leads. CXR was without acute cardiopulmonary abnormalities. Labs are significant for mild anemia (chronic) and mild thrombocytopenia (chronic).  He states that he discussed briefly doing a cath with Dr. Excell Seltzer and would like to re-address that conversation in the AM.   Past Medical History  Diagnosis Date  . Hyperlipidemia   . Hypertension   . Carotid artery occlusion     s/p L CEA in 2010  . Crohn's disease     colon resection  . Bradycardia     previous intolerance to beta blockers - low dose toprol started 10/2011 for tachycardia but had rash and was discontinued  . Chronic systolic heart failure     echo 7/12: EF 35-40%, anteroseptal hypokinesis, mild MR.  .  Ischemic cardiomyopathy     EF 34% (Lexiscan 06/2011), 35-40% (echo 11/2010)  . Coronary artery disease     CABG 1987, redo 2008; Last LHC 1/10: SVG-OM 1/OM 2 patent, SVG-LAD patent, SVG-OM 3 Patent  . Peripheral vascular disease     Carotid dz s/p L CEA, RAS, AAA, LE dz  . Renal artery stenosis     Dopplers 12/2010 - 1-59% R ostial renal artery stenosis, 2 L renal arteries both widely patent  . Gouty arthritis   . AAA (abdominal aortic aneurysm)     Ectatic abdominal aorta with fusiform dilitation 3.4x3.4 and moderate thrombus burden 12/2010  . RBBB   . Tachycardia     a. 10/2011 - atrial tach vs avnrt - BB started.  . Myocardial infarction   . COPD (chronic obstructive pulmonary disease)      Surgical History:  Past Surgical History  Procedure Laterality Date  . Carotid endarterectomy  2010    left  . Colon resection    . Tonsillectomy      as a child  . Pr vein bypass graft,aorto-fem-pop  10/1985  . Vein bypass surgery      LEFT   06/2011  . Endarterectomy  08/26/2011    Procedure: ENDARTERECTOMY ILIAC;  Surgeon: [name], MD;  Location: [facility] OR;  Service: Vascular;  Laterality: Right;  Right Femoral Artery Endarterectomy with vascu guard patch angioplasty & intraoperative arteriogram.  . Coronary artery  bypass graft  10/31/85 & 08/19/06     Home Meds: Prior to Admission medications   Medication Sig Start Date End Date Taking? Authorizing Provider  acetaminophen (TYLENOL) 500 MG tablet Take 1,000 mg by mouth every 6 (six) hours as needed for pain.   Yes Historical Provider, MD  amLODipine (NORVASC) 5 MG tablet Take 1 tablet (5 mg total) by mouth daily. 04/19/12  Yes Rosalio Macadamia, NP  aspirin 81 MG tablet Take 81 mg by mouth daily.    Yes Historical Provider, MD  atorvastatin (LIPITOR) 10 MG tablet Take 10 mg by mouth at bedtime.    Yes Historical Provider, MD  clopidogrel (PLAVIX) 75 MG tablet Take 75 mg by mouth daily.   Yes Historical Provider, MD  COLCRYS 0.6 MG tablet Take  0.6 mg by mouth daily as needed. For gout 04/17/11  Yes Historical Provider, MD  fexofenadine (ALLEGRA) 180 MG tablet Take 180 mg by mouth daily as needed (seasonal allergies).    Yes Historical Provider, MD  ipratropium (ATROVENT) 0.03 % nasal spray Place 1 spray into the nose daily.    Yes Historical Provider, MD  isosorbide mononitrate (IMDUR) 60 MG 24 hr tablet Take 1 tablet (60 mg total) by mouth 2 (two) times daily. 07/22/12  Yes [name], MD  Multiple Vitamin (MULITIVITAMIN WITH MINERALS) TABS Take 1 tablet by mouth every evening.    Yes Historical Provider, MD  nitroGLYCERIN (NITROSTAT) 0.4 MG SL tablet Place 0.4 mg under the tongue every 5 (five) minutes as needed for chest pain.   Yes Historical Provider, MD  pantoprazole (PROTONIX) 40 MG tablet Take 1 tablet (40 mg total) by mouth daily. 04/19/12  Yes Rosalio Macadamia, NP  propranolol (INDERAL) 10 MG tablet Take 10 mg by mouth 3 (three) times daily.   Yes Historical Provider, MD  ranolazine (RANEXA) 500 MG 12 hr tablet Take 500 mg by mouth 2 (two) times daily.   Yes Historical Provider, MD  valsartan (DIOVAN) 80 MG tablet Take 1 tablet (80 mg total) by mouth daily. 04/19/12  Yes Rosalio Macadamia, NP    Allergies:  Allergies  Allergen Reactions  . Novocain (Procaine Hcl) Other (See Comments)    Cold sweats and very bad headache.   . Metoprolol Hives, Itching and Rash    History   Social History  . Marital Status: Married    Spouse Name: N/A    Number of Children: N/A  . Years of Education: N/A   Occupational History  . Not on file.   Social History Main Topics  . Smoking status: Never Smoker   . Smokeless tobacco: Never Used  . Alcohol Use: No  . Drug Use: No  . Sexually Active: Yes   Other Topics Concern  . Not on file   Social History Narrative  . No narrative on file     Family History  Problem Relation Age of Onset  . Heart disease Father   . Cancer Mother   . COPD Mother   . Anesthesia problems Neg  Hx   . Deep vein thrombosis Brother   . COPD Brother   . CAD Brother     Review of Systems: General: negative for chills, fever, night sweats or weight changes.  Cardiovascular: see HPI Dermatological: negative for rash Respiratory: negative for cough or wheezing Urologic: negative for hematuria Abdominal: negative for nausea, vomiting, diarrhea, bright red blood per rectum, melena, or hematemesis Neurologic: negative for visual changes, syncope, or dizziness All  other systems reviewed and are otherwise negative except as noted above.  Labs:   Lab Results  Component Value Date   WBC 5.5 07/27/2012   HGB 12.2* 07/27/2012   HCT 36.0* 07/27/2012   MCV 96.3 07/27/2012   PLT 136* 07/27/2012    Recent Labs Lab 07/27/12 2022 07/27/12 2118  NA 138 141  K 4.6 4.6  CL 104 105  CO2 26  --   BUN 23 23  CREATININE 1.34 1.30  CALCIUM 9.2  --   PROT 6.4  --   BILITOT 0.3  --   ALKPHOS 77  --   ALT 20  --   AST 21  --   GLUCOSE 120* 114*   No results found for this basename: CKTOTAL, CKMB, TROPONINI,  in the last 72 hours Lab Results  Component Value Date   CHOL 87 10/18/2011   HDL 36* 10/18/2011   LDLCALC 34 10/18/2011   TRIG 83 10/18/2011    Radiology/Studies:  Dg Chest 2 View  07/27/2012  *RADIOLOGY REPORT*  Clinical Data: Chest pain.  CHEST - 2 VIEW  Comparison: One-view chest 06/08/third  Findings: Pain heart size is normal.  The lungs are clear.  Remote left-sided rib fractures are seen.  Leftward curvature of the thoracic spine is stable.  IMPRESSION: No acute cardiopulmonary disease or significant interval change.   Original Report Authenticated By: Marin Roberts, M.D.      EKG: sinus, LAFB, RBBB, anteroseptal qs, no change since 04/19/2012  Physical Exam: Blood pressure 115/71, pulse 65, temperature 97.6 F (36.4 C), temperature source Oral, resp. rate 17, SpO2 99.00%. General: Well developed, well nourished, in no acute distress. Head: Normocephalic, atraumatic,  sclera non-icteric, nares are without discharge Neck: Supple. JVD not elevated. Lungs: Clear bilaterally to auscultation without wheezes, rales, or rhonchi. Breathing is unlabored. Heart: RRR with S1 S2. 1/6 SEM, c/w aortic murmur Abdomen: Soft, non-tender, non-distended with normoactive bowel sounds. No rebound/guarding. No obvious abdominal masses. Msk:  Strength and tone appear normal for age. Extremities: No edema. No clubbing or cyanosis. Distal pedal pulses are 2+ and equal bilaterally. Neuro: Alert and oriented X 3. Moves all extremities spontaneously. Psych:  Responds to questions appropriately with a normal affect.    Problem List 1. Accelerating angina 2. CAD - s/p MI, 2 x CABG 3. PVD s/p iliac procedures 4. Mild anemia (chronic) 5. Mild thrombocytopenia (chronic) 6. Ischemic cardiomyopathy, EF 35-40%, currently euvolemic 7. HTN, controlled 8. Hyperlipidemia 9. Bifascicular block, asymptomatic  ASSESSMENT AND PLAN:   79 y.o. male w/ PMHx significant for CAD s/p MI and 2 CABGs, PVD s/p L CEA, R iliac endarterectomies, isch CMP EF 35-40%  who presented to [facility] on 07/27/2012 with complaints of chest pain concerning for accelerating angina.  Encouragingly, he is currently chest pain free and initial EKG and biomarkers are negative for infarct. However, his accelerating symptoms despite additional anti-anginal therapy suggest that he has a lesion that may benefit from an intervention. He would like to further discuss this with his cardiologist in the AM but he seems amenable.  At this time, will continue his anti-anginal package of imdur, BB, amlodipine, ARB, and ranolazine.  Continue aspirin and plavix. Will hold on heparinizing unless recurrent symptoms or + biomarkers (mild thrombocytopenia further increases the threshold to initiate).  His extensive vascular disease and H/o CABG does complicate access for catheterization.  PPI Ambulating NPO at midnight for  possible cath tomorrow.  Signed, Adolm Joseph, Wava Kildow C. MD 07/27/2012, 11:15  PM   

## 2012-07-27 NOTE — Telephone Encounter (Signed)
Pt called c/o persistent chest pain. Has h/o CAD, recently seen in office for chest pain worrisome for angina. Cath considered but the patient was not inclined to pursue that yet. However, this afternoon about an hour ago he developed chest tightness that has centralized in the middle of his chest. He took 3 SL NTG with some relief but it will not let up. I advised he proceed to ER and call 911. He does not want to call an ambulance. I told him this is our advice, but if he choses to come up by private vehicle he should not drive himself. He verbalized understanding and plans to come to the ER. Meriah Shands PA-C

## 2012-07-27 NOTE — ED Notes (Signed)
Pt c/o chest tightness that started at 4pm.  Pt localizes pain to central chest with some radiation to back.  Pt took 3 nitros prior to EMS arrival with no pain relief.  Pt rated pain 4/10 upon EMS arrival.  No distress, SOB or diaphoresis noted.  EMS gave pt 2 nitros with pain relief. Pt also took 81 ASA at home.  VSS.  Pt no longer c/o chest pain. 18 g LFA

## 2012-07-28 ENCOUNTER — Encounter (HOSPITAL_COMMUNITY)
Admission: EM | Disposition: A | Discharge status: Home / Self Care | Payer: Self-pay | Source: Home / Self Care | Attending: Emergency Medicine

## 2012-07-28 ENCOUNTER — Encounter: Payer: Self-pay | Admitting: Vascular Surgery

## 2012-07-28 ENCOUNTER — Encounter (HOSPITAL_COMMUNITY): Payer: Self-pay | Admitting: Physician Assistant

## 2012-07-28 DIAGNOSIS — I200 Unstable angina: Secondary | ICD-10-CM | POA: Diagnosis present

## 2012-07-28 DIAGNOSIS — I10 Essential (primary) hypertension: Secondary | ICD-10-CM

## 2012-07-28 DIAGNOSIS — I2510 Atherosclerotic heart disease of native coronary artery without angina pectoris: Secondary | ICD-10-CM

## 2012-07-28 HISTORY — PX: CARDIAC CATHETERIZATION: SHX172

## 2012-07-28 HISTORY — PX: LEFT HEART CATHETERIZATION WITH CORONARY ANGIOGRAM: SHX5451

## 2012-07-28 LAB — CBC
HCT: 35.8 % — ABNORMAL LOW (ref 39.0–52.0)
Hemoglobin: 12.8 g/dL — ABNORMAL LOW (ref 13.0–17.0)
MCV: 93 fL (ref 78.0–100.0)
RBC: 3.85 MIL/uL — ABNORMAL LOW (ref 4.22–5.81)
RDW: 13.3 % (ref 11.5–15.5)
WBC: 5.2 10*3/uL (ref 4.0–10.5)

## 2012-07-28 LAB — BASIC METABOLIC PANEL
BUN: 20 mg/dL (ref 6–23)
CO2: 27 mEq/L (ref 19–32)
Chloride: 104 mEq/L (ref 96–112)
Creatinine, Ser: 1.35 mg/dL (ref 0.50–1.35)
GFR calc Af Amer: 54 mL/min — ABNORMAL LOW (ref >90)
Potassium: 4.4 mEq/L (ref 3.5–5.1)

## 2012-07-28 LAB — TROPONIN I: Troponin I: <0.3 ng/mL (ref <0.30)

## 2012-07-28 SURGERY — LEFT HEART CATHETERIZATION WITH CORONARY ANGIOGRAM
Anesthesia: LOCAL

## 2012-07-28 MED ORDER — NITROGLYCERIN 0.4 MG SL SUBL: 0.4000 mg | SUBLINGUAL_TABLET | SUBLINGUAL | Status: DC | PRN

## 2012-07-28 MED ORDER — SODIUM CHLORIDE 0.9 % IV SOLN: 1.0000 mL/kg/h | INTRAVENOUS | Status: DC

## 2012-07-28 MED ORDER — CLOPIDOGREL BISULFATE 75 MG PO TABS
75.0000 mg | ORAL_TABLET | Freq: Every day | ORAL | Status: DC
  Administered 2012-07-28: 75 mg via ORAL
  Filled 2012-07-28: qty 1

## 2012-07-28 MED ORDER — HEPARIN (PORCINE) IN NACL 2-0.9 UNIT/ML-% IJ SOLN
INTRAMUSCULAR | Status: AC
  Filled 2012-07-28: qty 1000

## 2012-07-28 MED ORDER — SODIUM CHLORIDE 0.9 % IJ SOLN
3.0000 mL | Freq: Two times a day (BID) | INTRAMUSCULAR | Status: DC
  Administered 2012-07-28: 3 mL via INTRAVENOUS

## 2012-07-28 MED ORDER — FENTANYL CITRATE 0.05 MG/ML IJ SOLN
INTRAMUSCULAR | Status: AC
  Filled 2012-07-28: qty 2

## 2012-07-28 MED ORDER — SODIUM CHLORIDE 0.9 % IV SOLN
1.0000 mL/kg/h | INTRAVENOUS | Status: DC
  Administered 2012-07-28: 1 mL/kg/h via INTRAVENOUS

## 2012-07-28 MED ORDER — ASPIRIN EC 81 MG PO TBEC
81.0000 mg | DELAYED_RELEASE_TABLET | Freq: Every day | ORAL | Status: DC
  Administered 2012-07-28: 81 mg via ORAL
  Filled 2012-07-28: qty 1

## 2012-07-28 MED ORDER — ATORVASTATIN CALCIUM 10 MG PO TABS
10.0000 mg | ORAL_TABLET | Freq: Every day | ORAL | Status: DC
  Filled 2012-07-28 (×2): qty 1

## 2012-07-28 MED ORDER — SODIUM CHLORIDE 0.9 % IV SOLN: 250.0000 mL | INTRAVENOUS | Status: DC | PRN

## 2012-07-28 MED ORDER — ACETAMINOPHEN 325 MG PO TABS: 650.0000 mg | ORAL_TABLET | ORAL | Status: DC | PRN

## 2012-07-28 MED ORDER — ONDANSETRON HCL 4 MG/2ML IJ SOLN: 4.0000 mg | Freq: Four times a day (QID) | INTRAMUSCULAR | Status: DC | PRN

## 2012-07-28 MED ORDER — ISOSORBIDE MONONITRATE ER 60 MG PO TB24
60.0000 mg | ORAL_TABLET | Freq: Two times a day (BID) | ORAL | Status: DC
  Administered 2012-07-28: 60 mg via ORAL
  Filled 2012-07-28 (×3): qty 1

## 2012-07-28 MED ORDER — IRBESARTAN 75 MG PO TABS
75.0000 mg | ORAL_TABLET | Freq: Every day | ORAL | Status: DC
  Administered 2012-07-28: 75 mg via ORAL
  Filled 2012-07-28: qty 1

## 2012-07-28 MED ORDER — LIDOCAINE HCL (PF) 1 % IJ SOLN
INTRAMUSCULAR | Status: AC
  Filled 2012-07-28: qty 30

## 2012-07-28 MED ORDER — SODIUM CHLORIDE 0.9 % IJ SOLN: 3.0000 mL | INTRAMUSCULAR | Status: DC | PRN

## 2012-07-28 MED ORDER — AMLODIPINE BESYLATE 5 MG PO TABS
5.0000 mg | ORAL_TABLET | Freq: Every day | ORAL | Status: DC
  Administered 2012-07-28: 5 mg via ORAL
  Filled 2012-07-28: qty 1

## 2012-07-28 MED ORDER — MIDAZOLAM HCL 2 MG/2ML IJ SOLN
INTRAMUSCULAR | Status: AC
  Filled 2012-07-28: qty 2

## 2012-07-28 MED ORDER — PANTOPRAZOLE SODIUM 40 MG PO TBEC
40.0000 mg | DELAYED_RELEASE_TABLET | Freq: Every day | ORAL | Status: DC
  Administered 2012-07-28: 40 mg via ORAL
  Filled 2012-07-28: qty 1

## 2012-07-28 MED ORDER — PROPRANOLOL HCL 10 MG PO TABS
10.0000 mg | ORAL_TABLET | Freq: Three times a day (TID) | ORAL | Status: DC
  Administered 2012-07-28 (×2): 10 mg via ORAL
  Filled 2012-07-28 (×4): qty 1

## 2012-07-28 MED ORDER — RANOLAZINE ER 500 MG PO TB12
500.0000 mg | ORAL_TABLET | Freq: Two times a day (BID) | ORAL | Status: DC
  Administered 2012-07-28: 500 mg via ORAL
  Filled 2012-07-28 (×3): qty 1

## 2012-07-28 NOTE — Discharge Summary (Signed)
Discharge Summary   Patient ID: Karl Stevenson,  MRN: [id number], DOB/AGE: 1926/11/28 79 y.o.  Admit date: 07/27/2012 Discharge date: 07/28/2012  Primary Physician: Garlan Fillers, MD Primary Cardiologist: Judie Petit. Excell Seltzer, MD   Discharge Diagnoses Principal Problem:   Unstable angina  - Ruled out with trop-I WNL x 4  - S/p diagnostic cardiac catheterization revealing no interval change of CAD and continued patency of bypass grafts when compared to 2010 cath  - To resume ASA/Plavix/ARB/BB/statin/Imdur/Ranexa/Norvasc/NTG SL PRN  - Plan to continue recently adjusted antianginals for symptom improvement Active Problems:   Crohn's disease   CAD (coronary artery disease)   HTN (hypertension)   Carotid stenosis   Ischemic cardiomyopathy   Chronic systolic heart failure   Peripheral vascular disease, unspecified   CKD (chronic kidney disease), stage III   Allergies Allergies  Allergen Reactions  . Novocain (Procaine Hcl) Other (See Comments)    Cold sweats and very bad headache.   . Metoprolol Hives, Itching and Rash    Diagnostic Studies/Procedures  PA/LATERAL CHEST X-RAY 07/27/12  IMPRESSION:  No acute cardiopulmonary disease or significant interval change.   CARDIAC CATHETERIZATION - 07/28/12  Hemodynamics:  AO 116/52  LV 118/8  Coronary angiography:  Coronary dominance: right  Left mainstem: Severely diseased with 95% stenosis.  Left anterior descending (LAD): Total occlusion of the proximal vessel.  Left circumflex (LCx): Total occlusion of the proximal vessel.  Right coronary artery (RCA): Small, nondominant vessel. No obstructive disease noted  Saphenous vein graft to first OM: The graft is patent. There is 60% smooth stenosis in the distal body of the graft. The target vessel is small.  Y. graft to mid LAD arising from body of the vein graft to OM: This graft is widely patent with no significant areas of disease. The LAD is extremely small in caliber and  diffusely diseased. The apical LAD has 95% stenosis. The first diagonal is moderate in caliber and fills retrograde from the LAD graft. There is 70% stenosis in the diagonal.  Sequential vein graft to OM 2 and left PDA: Widely patent without significant stenosis. Both target vessels are large in caliber and have no significant disease.  The LIMA to LAD is known to be atretic and it was not injected today.  Left ventriculography: There is severe hypokinesis of the mid and distal anterior walls. There is akinesis of the true apex. There is akinesis of the infero-apex. The basal and midinferior walls in the basal anterior wall contract normally. The estimated left ventricular ejection fraction is 40%.  Final Conclusions:  1. Severe native three-vessel coronary artery disease  2. Continued patency of the saphenous vein graft to first OM and saphenous vein graft to LAD and saphenous vein graft sequence to OM 2 and left PDA  3. Moderate segmental left ventricular systolic dysfunction.   History of Present Illness Karl MALTESE is a 79 y.o. male CAD s/p MI and 2 CABGs, PVD s/p L CEA, R iliac endarterectomies, isch CMP EF 35-40% who presented to [facility] on 07/27/2012 with complaints of chest pain.   He recently followed up with Dr. Excell Seltzer on 3/14 endorsing worsening episodes of chest discomfort. Based on his symptoms, Dr. Excell Seltzer increased his Imdur to 60 mg twice a day and as needed Norvasc for elevated blood pressures. He'll be compliant with these changes, however on the day of admission around 4 PM working on his computer he suddenly experienced substernal chest discomfort relieved somewhat with 3 sublingual ventricle since.  He called the office and was advised to present to the emergency department. He called 911 and the pain was resolved completely after 2 nitroglycerin were given by EMS.   He was pain-free and euvolemic on evaluation. EKG revealed no acute ischemic changes. Chest x-ray as  above indicated no acute cardiopulmonary abnormalities. The plan was made to observe the patient overnight and formally rule out, and to address an option of repeat diagnostic cardiac catheterization in the morning.  Hospital Course   He remained stable and pain-free overnight. Four subsequent sets of troponin I returned withinr normal limits. The decision was made to proceed with diagnostic cardiac catheterization.  He was informed, consented and prepped for cardiac catheterization which was accessed via the left femoral artery. As above, this indicates the native three-vessel coronary artery disease, continued patency of the SVG to OM 1 to LAD and SVG to OM 2 and left PDA. LVEF 40% with multiple wall motion abnormalities as described above. The patient's coronary anatomy was found to be stable as compared to prior study from 2010. Recommendation was made to proceed with continued medical therapy by Dr. Excell Seltzer. Patient tolerated the procedure well without complications. He completed schedule bedrest and ambulated with nursing without incident. He has been discharged. He will resume previously scheduled followup appointments and outpatient medications including recently adjusted antianginals. This information, including post cath instructions and activity restrictions, has been clearly outlined in the discharge AVS.   Discharge Vitals:  Blood pressure 124/65, pulse 61, temperature 97.5 F (36.4 C), temperature source Oral, resp. rate 17, height 5\' 7"  (1.702 m), weight 57.879 kg (127 lb 9.6 oz), SpO2 98.00%.   Labs: Recent Labs     07/27/12  2022  07/27/12  2118  07/28/12  0630  WBC  5.5   --   5.2  HGB  12.5*  12.2*  12.8*  HCT  36.0*  36.0*  35.8*  MCV  96.3   --   93.0  PLT  136*   --   132*   Recent Labs Lab 07/27/12 2022 07/27/12 2118 07/28/12 0630  NA 138 141 139  K 4.6 4.6 4.4  CL 104 105 104  CO2 26  --  27  BUN 23 23 20   CREATININE 1.34 1.30 1.35  CALCIUM 9.2  --  9.4    PROT 6.4  --   --   BILITOT 0.3  --   --   ALKPHOS 77  --   --   ALT 20  --   --   AST 21  --   --   GLUCOSE 120* 114* 97   Recent Labs     07/28/12  0053  07/28/12  0630  07/28/12  1242  TROPONINI  <0.30  <0.30  <0.30   Disposition:   Future Appointments Provider Department Dept Phone   07/29/2012 1:00 PM [facility] [phone]   07/29/2012 1:45 PM [name], MD [facility] [phone]   08/09/2012 10:30 AM Rosalio Macadamia, NP [facility] Main Office [place]) [phone]   10/18/2012 10:30 AM [name], MD [facility] Main Office [place]) [phone]   06/23/2013 9:30 AM [facility] Lab 3 [facility] [phone]   06/23/2013 10:00 AM [facility] 3 [facility] [phone]   06/23/2013 10:30 AM [facility] Lab 3 [facility] [phone]   06/23/2013 11:40 AM Evern Bio, NP [facility] [phone]     Discharge Medications:    Medication List    TAKE these medications       acetaminophen 500 MG tablet  Commonly known as:  TYLENOL  Take 1,000 mg by mouth every 6 (six) hours as needed for pain.     amLODipine 5 MG tablet  Commonly known as:  NORVASC  Take 1 tablet (5 mg total) by mouth daily.     aspirin 81 MG tablet  Take 81 mg by mouth daily.     atorvastatin 10 MG tablet  Commonly known as:  LIPITOR  Take 10 mg by mouth at bedtime.     clopidogrel 75 MG tablet  Commonly known as:  PLAVIX  Take 75 mg by mouth daily.     COLCRYS 0.6 MG tablet  Generic drug:  colchicine  Take 0.6 mg by mouth daily as needed. For gout     fexofenadine 180 MG tablet  Commonly known as:  ALLEGRA  Take 180 mg by mouth daily as needed (seasonal allergies).     ipratropium 0.03 % nasal spray  Commonly known as:  ATROVENT  Place 1 spray into the nose daily.     isosorbide  mononitrate 60 MG 24 hr tablet  Commonly known as:  IMDUR  Take 1 tablet (60 mg total) by mouth 2 (two) times daily.     multivitamin with minerals Tabs  Take 1 tablet by mouth every evening.     nitroGLYCERIN 0.4 MG SL tablet  Commonly known as:  NITROSTAT  Place 0.4 mg under the tongue every 5 (five) minutes as needed for chest pain.     pantoprazole 40 MG tablet  Commonly known as:  PROTONIX  Take 1 tablet (40 mg total) by mouth daily.     propranolol 10 MG tablet  Commonly known as:  INDERAL  Take 10 mg by mouth 3 (three) times daily.     ranolazine 500 MG 12 hr tablet  Commonly known as:  RANEXA  Take 500 mg by mouth 2 (two) times daily.     valsartan 80 MG tablet  Commonly known as:  DIOVAN  Take 1 tablet (80 mg total) by mouth daily.       Outstanding Labs/Studies: None  Duration of Discharge Encounter: Greater than 30 minutes including physician time.  Signed, R. Hurman Horn, PA-C 07/28/2012, 6:27 PM

## 2012-07-28 NOTE — CV Procedure (Signed)
   Cardiac Catheterization Procedure Note  Name: Karl Stevenson MRN: [id number] DOB: 06/28/1926  Procedure: Left Heart Cath, Selective Coronary Angiography, LV angiography, SVG angiography  Indication: 79 year old gentleman with history of 2 previous bypass. He has also undergone PCI. He has presented with accelerating angina, refractory to maximal medical therapy.  Procedural details: The left groin was prepped, draped, and anesthetized with 1% lidocaine. Using modified Seldinger technique, a 5 French sheath was introduced into the left femoral artery. Standard Judkins catheters were used for coronary angiography and left ventriculography. And LCB catheter was used for saphenous vein graft angiography. Catheter exchanges were performed over a guidewire. There were no immediate procedural complications. The patient was transferred to the post catheterization recovery area for further monitoring.  Procedural Findings: Hemodynamics:  AO 116/52 LV 118/8   Coronary angiography: Coronary dominance: right  Left mainstem: Severely diseased with 95% stenosis.  Left anterior descending (LAD): Total occlusion of the proximal vessel.  Left circumflex (LCx): Total occlusion of the proximal vessel.  Right coronary artery (RCA): Small, nondominant vessel. No obstructive disease noted  Saphenous vein graft to first OM: The graft is patent. There is 60% smooth stenosis in the distal body of the graft. The target vessel is small.  Y. graft to mid LAD arising from body of the vein graft to OM: This graft is widely patent with no significant areas of disease. The LAD is extremely small in caliber and diffusely diseased. The apical LAD has 95% stenosis. The first diagonal is moderate in caliber and fills retrograde from the LAD graft. There is 70% stenosis in the diagonal.  Sequential vein graft to OM 2 and left PDA: Widely patent without significant stenosis. Both target vessels are large in caliber  and have no significant disease.  The LIMA to LAD is known to be atretic and it was not injected today.  Left ventriculography: There is severe hypokinesis of the mid and distal anterior walls. There is akinesis of the true apex. There is akinesis of the infero-apex. The basal and midinferior walls in the basal anterior wall contract normally. The estimated left ventricular ejection fraction is 40%.  Final Conclusions:   1. Severe native three-vessel coronary artery disease 2. Continued patency of the saphenous vein graft to first OM and saphenous vein graft to LAD and saphenous vein graft sequence to OM 2 and left PDA 3. Moderate segmental left ventricular systolic dysfunction.  Recommendations: The patient's coronary anatomy is stable. I compared his films to his previous study from 2010. I would recommend continued medical therapy.  [name] 07/28/2012, 2:41 PM

## 2012-07-28 NOTE — ED Provider Notes (Signed)
Medical screening examination/treatment/procedure(s) were conducted as a shared visit with non-physician practitioner(s) and myself.  I personally evaluated the patient during the encounter.  79 year old male with chest pain concerning for unstable angina. EKG without acute changes to suggest ischemia. Initial troponin normal. Case discussed with cardiology by Dahlia Client. Admission.  Raeford Razor, MD 07/28/12 [phone]

## 2012-07-28 NOTE — Progress Notes (Signed)
Utilization review completed.  

## 2012-07-28 NOTE — H&P (View-Only) (Signed)
 Patient Name: Hashim C Ditullio Date of Encounter: 07/28/2012   Principal Problem:   Unstable angina Active Problems:   CAD (coronary artery disease)   HTN (hypertension)   Ischemic cardiomyopathy   Chronic systolic heart failure   Peripheral vascular disease, unspecified   CKD (chronic kidney disease), stage III   Crohn's disease   Carotid stenosis   SUBJECTIVE  No chest pain since admission.  Troponin's negative.  CURRENT MEDS . amLODipine  5 mg Oral Daily  . aspirin EC  81 mg Oral Daily  . atorvastatin  10 mg Oral QHS  . clopidogrel  75 mg Oral Q breakfast  . irbesartan  75 mg Oral Daily  . isosorbide mononitrate  60 mg Oral BID  . pantoprazole  40 mg Oral Daily  . propranolol  10 mg Oral TID  . ranolazine  500 mg Oral BID  . sodium chloride  3 mL Intravenous Q12H    OBJECTIVE  Filed Vitals:   07/28/12 0000 07/28/12 0015 07/28/12 0044 07/28/12 0500  BP: 134/69  165/84 129/79  Pulse: 67 66 81 64  Temp:   97.7 F (36.5 C) 98.2 F (36.8 C)  TempSrc:   Oral Oral  Resp: 25 14 17 16  Height:   5' 7" (1.702 m)   Weight:   128 lb (58.06 kg) 127 lb 9.6 oz (57.879 kg)  SpO2: 97% 99% 98% 99%   No intake or output data in the 24 hours ending 07/28/12 0813 Filed Weights   07/28/12 0044 07/28/12 0500  Weight: 128 lb (58.06 kg) 127 lb 9.6 oz (57.879 kg)    PHYSICAL EXAM  General: Pleasant, NAD. Neuro: Alert and oriented X 3. Moves all extremities spontaneously. Psych: Normal affect. HEENT:  Normal  Neck: Supple without JVD.  R bruit. Lungs:  Resp regular and unlabored, CTA. Heart: RRR - distant, no s3, s4, or murmurs. Abdomen: Soft, non-tender, non-distended, BS + x 4.  Extremities: No clubbing, cyanosis or edema. DP/PT/Radials 2+ and equal bilaterally. Strong femoral pulses with soft bilat fem bruits noted.  Accessory Clinical Findings  CBC  Recent Labs  07/27/12 2022 07/27/12 2118 07/28/12 0630  WBC 5.5  --  5.2  HGB 12.5* 12.2* 12.8*  HCT 36.0*  36.0* 35.8*  MCV 96.3  --  93.0  PLT 136*  --  132*   Basic Metabolic Panel  Recent Labs  07/27/12 2022 07/27/12 2118 07/28/12 0630  NA 138 141 139  K 4.6 4.6 4.4  CL 104 105 104  CO2 26  --  27  GLUCOSE 120* 114* 97  BUN 23 23 20  CREATININE 1.34 1.30 1.35  CALCIUM 9.2  --  9.4   Liver Function Tests  Recent Labs  07/27/12 2022  AST 21  ALT 20  ALKPHOS 77  BILITOT 0.3  PROT 6.4  ALBUMIN 3.7   Cardiac Enzymes  Recent Labs  07/28/12 0053 07/28/12 0630  TROPONINI <0.30 <0.30   TELE  RSR, 1st deg AVB, pvc's.  ECG   RSR, 86, 1st deg avb, rbbb, lad, lafb  Radiology/Studies  Dg Chest 2 View  07/27/2012  *RADIOLOGY REPORT*  Clinical Data: Chest pain.  CHEST - 2 VIEW  Comparison: One-view chest 06/08/third  Findings: Pain heart size is normal.  The lungs are clear.  Remote left-sided rib fractures are seen.  Leftward curvature of the thoracic spine is stable.  IMPRESSION: No acute cardiopulmonary disease or significant interval change.   Original Report Authenticated By: Christopher Mattern,   M.D.    ASSESSMENT AND PLAN  1.  USA/CAD:  Pt with recurrent chest pain despite outpt med titration.  Currently pain free.  CE negative.  Plan cath today.   Cont asa, plavix, statin, bb, nitrate, ranexa.  2.  HTN:  BP up this AM.  Follow trend and adjust meds as necessary.  3.  ICM/Chronic systolic chf:  euvolemic on exam.  Cont bb/arb.  4.  CKD III:  Creat stable.  Hydrate pre-cath.  Signed, Christopher Berge NP  Patient seen, examined. Available data reviewed. Agree with findings, assessment, and plan as outlined by Chris Berge, NP. Mr Vieth is well-known to me and I saw him in the office last week. He has accelerating angina refractory to medical therapy. Exam reveals a previously harvested left radial artery. He has palpable femorals bilaterally. Plan cath and possible PCI today.  Damin Salido, M.D. 07/28/2012 1:56 PM   

## 2012-07-28 NOTE — Progress Notes (Signed)
Patient Name: Karl Stevenson Date of Encounter: 07/28/2012   Principal Problem:   Unstable angina Active Problems:   CAD (coronary artery disease)   HTN (hypertension)   Ischemic cardiomyopathy   Chronic systolic heart failure   Peripheral vascular disease, unspecified   CKD (chronic kidney disease), stage III   Crohn's disease   Carotid stenosis   SUBJECTIVE  No chest pain since admission.  Troponin's negative.  CURRENT MEDS . amLODipine  5 mg Oral Daily  . aspirin EC  81 mg Oral Daily  . atorvastatin  10 mg Oral QHS  . clopidogrel  75 mg Oral Q breakfast  . irbesartan  75 mg Oral Daily  . isosorbide mononitrate  60 mg Oral BID  . pantoprazole  40 mg Oral Daily  . propranolol  10 mg Oral TID  . ranolazine  500 mg Oral BID  . sodium chloride  3 mL Intravenous Q12H    OBJECTIVE  Filed Vitals:   07/28/12 0000 07/28/12 0015 07/28/12 0044 07/28/12 0500  BP: 134/69  165/84 129/79  Pulse: 67 66 81 64  Temp:   97.7 F (36.5 C) 98.2 F (36.8 C)  TempSrc:   Oral Oral  Resp: 25 14 17 16   Height:   5\' 7"  (1.702 m)   Weight:   128 lb (58.06 kg) 127 lb 9.6 oz (57.879 kg)  SpO2: 97% 99% 98% 99%   No intake or output data in the 24 hours ending 07/28/12 0813 Filed Weights   07/28/12 0044 07/28/12 0500  Weight: 128 lb (58.06 kg) 127 lb 9.6 oz (57.879 kg)    PHYSICAL EXAM  General: Pleasant, NAD. Neuro: Alert and oriented X 3. Moves all extremities spontaneously. Psych: Normal affect. HEENT:  Normal  Neck: Supple without JVD.  R bruit. Lungs:  Resp regular and unlabored, CTA. Heart: RRR - distant, no s3, s4, or murmurs. Abdomen: Soft, non-tender, non-distended, BS + x 4.  Extremities: No clubbing, cyanosis or edema. DP/PT/Radials 2+ and equal bilaterally. Strong femoral pulses with soft bilat fem bruits noted.  Accessory Clinical Findings  CBC  Recent Labs  07/27/12 2022 07/27/12 2118 07/28/12 0630  WBC 5.5  --  5.2  HGB 12.5* 12.2* 12.8*  HCT 36.0*  36.0* 35.8*  MCV 96.3  --  93.0  PLT 136*  --  132*   Basic Metabolic Panel  Recent Labs  07/27/12 2022 07/27/12 2118 07/28/12 0630  NA 138 141 139  K 4.6 4.6 4.4  CL 104 105 104  CO2 26  --  27  GLUCOSE 120* 114* 97  BUN 23 23 20   CREATININE 1.34 1.30 1.35  CALCIUM 9.2  --  9.4   Liver Function Tests  Recent Labs  07/27/12 2022  AST 21  ALT 20  ALKPHOS 77  BILITOT 0.3  PROT 6.4  ALBUMIN 3.7   Cardiac Enzymes  Recent Labs  07/28/12 0053 07/28/12 0630  TROPONINI <0.30 <0.30   TELE  RSR, 1st deg AVB, pvc's.  ECG   RSR, 86, 1st deg avb, rbbb, lad, lafb  Radiology/Studies  Dg Chest 2 View  07/27/2012  *RADIOLOGY REPORT*  Clinical Data: Chest pain.  CHEST - 2 VIEW  Comparison: One-view chest 06/08/third  Findings: Pain heart size is normal.  The lungs are clear.  Remote left-sided rib fractures are seen.  Leftward curvature of the thoracic spine is stable.  IMPRESSION: No acute cardiopulmonary disease or significant interval change.   Original Report Authenticated By: Marin Roberts,  M.D.    ASSESSMENT AND PLAN  1.  USA/CAD:  Pt with recurrent chest pain despite outpt med titration.  Currently pain free.  CE negative.  Plan cath today.   Cont asa, plavix, statin, bb, nitrate, ranexa.  2.  HTN:  BP up this AM.  Follow trend and adjust meds as necessary.  3.  ICM/Chronic systolic chf:  euvolemic on exam.  Cont bb/arb.  4.  CKD III:  Creat stable.  Hydrate pre-cath.  Signed, Nicolasa Ducking NP  Patient seen, examined. Available data reviewed. Agree with findings, assessment, and plan as outlined by Ward Givens, NP. Karl Stevenson is well-known to me and I saw him in the office last week. He has accelerating angina refractory to medical therapy. Exam reveals a previously harvested left radial artery. He has palpable femorals bilaterally. Plan cath and possible PCI today.  [name], M.D. 07/28/2012 1:56 PM

## 2012-07-28 NOTE — Interval H&P Note (Signed)
History and Physical Interval Note:  07/28/2012 1:59 PM  Karl Stevenson  has presented today for surgery, with the diagnosis of cp  The various methods of treatment have been discussed with the patient and family. After consideration of risks, benefits and other options for treatment, the patient has consented to  Procedure(s): LEFT HEART CATHETERIZATION WITH CORONARY ANGIOGRAM (N/A) as a surgical intervention .  The patient's history has been reviewed, patient examined, no change in status, stable for surgery.  I have reviewed the patient's chart and labs.  Questions were answered to the patient's satisfaction.     [name]

## 2012-07-28 NOTE — Progress Notes (Signed)
Pt ambulated in hallways after bedrest. No complains, VS remain stable. Discharge instructions discussed with pt. Pt discharged home.

## 2012-07-29 ENCOUNTER — Telehealth: Payer: Self-pay | Admitting: Cardiovascular Disease

## 2012-07-29 ENCOUNTER — Ambulatory Visit: Payer: Medicare PPO | Admitting: Vascular Surgery

## 2012-07-29 NOTE — Telephone Encounter (Signed)
Agree; thx 

## 2012-07-29 NOTE — Telephone Encounter (Signed)
The pt took his bandage off this morning and was walking around the house when he felt a warm trickle down his leg. The pt noticed bleeding from his cath site and this resolved in a minute.  I made the pt aware that this was most likely related to the puncture site from his cardiac catheterization. The pt denies any swelling or further bleeding at site. I made the pt aware that he should continue to monitor site.  If the pt develops bleeding again and if there is a significant amount of blood he should call EMS.  Pt verbalized understanding.  I also reinforced the pt's post cath instructions.

## 2012-07-29 NOTE — Telephone Encounter (Signed)
New problem   Pt had heart cath yesterday and said when he took his bandage off it started bleeding but now it has stopped. Pt wanted to let someone know just in case it might be a problem.

## 2012-08-03 ENCOUNTER — Encounter: Payer: Self-pay | Admitting: Cardiology

## 2012-08-04 ENCOUNTER — Encounter: Payer: Self-pay | Admitting: Cardiology

## 2012-08-05 ENCOUNTER — Ambulatory Visit: Payer: Medicare PPO | Admitting: Vascular Surgery

## 2012-08-09 ENCOUNTER — Ambulatory Visit (INDEPENDENT_AMBULATORY_CARE_PROVIDER_SITE_OTHER): Payer: Medicare PPO | Admitting: Nurse Practitioner

## 2012-08-09 ENCOUNTER — Encounter: Payer: Self-pay | Admitting: Nurse Practitioner

## 2012-08-09 VITALS — BP 160/70 | HR 60 | Ht 67.0 in | Wt 135.0 lb

## 2012-08-09 DIAGNOSIS — I2510 Atherosclerotic heart disease of native coronary artery without angina pectoris: Secondary | ICD-10-CM

## 2012-08-09 MED ORDER — LOSARTAN POTASSIUM 100 MG PO TABS: 100.0000 mg | ORAL_TABLET | Freq: Every day | ORAL | Status: DC

## 2012-08-09 MED ORDER — ISOSORBIDE MONONITRATE ER 60 MG PO TB24: 60.0000 mg | ORAL_TABLET | Freq: Two times a day (BID) | ORAL | Status: DC

## 2012-08-09 NOTE — Patient Instructions (Addendum)
Stay on your current medicines but I have switched your Diovan (valsartan) to Losartan 100 mg (Cozaar) when your current supply of Diovan runs out  Monitor your blood pressure at home  See Dr. Excell Seltzer in June as scheduled  Call the [facility] office at [phone] if you have any questions, problems or concerns.

## 2012-08-09 NOTE — Progress Notes (Signed)
Karl Stevenson Date of Birth: 1926/09/17 Medical Record #[id number]  History of Present Illness: Karl Stevenson is seen back today for a post hospital visit. He is seen for Dr. Excell Seltzer. He has multiple issues which include CAD with 2 prior CABGs, first in 1987 and the 2nd in 2008. Has known PVD with multiple PV procedures. Other issues include AAA, HTN, and HLD. Last stress test in February of 2013. EF was 40%. Other issues include bradycardia and tachycardia with a bifascicular block but has not required PTVP implant. He uses prn propranolol if his heart rate is elevated.   He was most recently seen back here by Dr. Excell Seltzer. Appeared to be failing on medical therapy due to refractory angina. He underwent cardiac cath. Those results are as noted below. Medical management will be continued. EF remains 40%.   He comes in today. He is here alone. Doing ok. Feels ok on his medicines. Needs his Imdur refilled. The Diovan is too expensive. Will change to Losartan. No more chest pain. BP has been good at home. Back walking. The better weather has also made him feel better. He is happy with how he is doing. No more trouble with his groin.  Current Outpatient Prescriptions on File Prior to Visit  Medication Sig Dispense Refill  . acetaminophen (TYLENOL) 500 MG tablet Take 1,000 mg by mouth every 6 (six) hours as needed for pain.      [name] amLODipine (NORVASC) 5 MG tablet Take 1 tablet (5 mg total) by mouth daily.  30 tablet  6  . aspirin 81 MG tablet Take 81 mg by mouth daily.       [name] atorvastatin (LIPITOR) 10 MG tablet Take 10 mg by mouth at bedtime.       . clopidogrel (PLAVIX) 75 MG tablet Take 75 mg by mouth daily.      [name] COLCRYS 0.6 MG tablet Take 0.6 mg by mouth daily as needed. For gout      . fexofenadine (ALLEGRA) 180 MG tablet Take 180 mg by mouth daily as needed (seasonal allergies).       [name] ipratropium (ATROVENT) 0.03 % nasal spray Place 1 spray into the nose daily.       . Multiple Vitamin  (MULITIVITAMIN WITH MINERALS) TABS Take 1 tablet by mouth every evening.       . nitroGLYCERIN (NITROSTAT) 0.4 MG SL tablet Place 0.4 mg under the tongue every 5 (five) minutes as needed for chest pain.      . pantoprazole (PROTONIX) 40 MG tablet Take 1 tablet (40 mg total) by mouth daily.  90 tablet  3  . ranolazine (RANEXA) 500 MG 12 hr tablet Take 500 mg by mouth 2 (two) times daily.       No current facility-administered medications on file prior to visit.    Allergies  Allergen Reactions  . Novocain (Procaine Hcl) Other (See Comments)    Cold sweats and very bad headache.   . Metoprolol Hives, Itching and Rash    Past Medical History  Diagnosis Date  . Hyperlipidemia   . Hypertension   . Carotid artery occlusion     s/p L CEA in 2010  . Crohn's disease     colon resection  . Bradycardia     previous intolerance to beta blockers - low dose toprol started 10/2011 for tachycardia but had rash and was discontinued  . Chronic systolic heart failure     echo 7/12: EF 35-40%, anteroseptal hypokinesis, mild  MR.  . Ischemic cardiomyopathy     EF 34% (Lexiscan 06/2011), 35-40% (echo 11/2010)  . Coronary artery disease     CABG 1987, redo 2008; Last LHC 1/10: SVG-OM 1/OM 2 patent, SVG-LAD patent, SVG-OM 3 Patent  . Peripheral vascular disease     Carotid dz s/p L CEA, RAS, AAA, LE dz  . Renal artery stenosis     Dopplers 12/2010 - 1-59% R ostial renal artery stenosis, 2 L renal arteries both widely patent  . Gouty arthritis   . AAA (abdominal aortic aneurysm)     Ectatic abdominal aorta with fusiform dilitation 3.4x3.4 and moderate thrombus burden 12/2010  . RBBB   . Tachycardia     a. 10/2011 - atrial tach vs avnrt - BB started.  . Myocardial infarction   . COPD (chronic obstructive pulmonary disease)     Past Surgical History  Procedure Laterality Date  . Carotid endarterectomy  2010    left  . Colon resection    . Tonsillectomy      as a child  . Pr vein bypass  graft,aorto-fem-pop  10/1985  . Vein bypass surgery      LEFT   06/2011  . Endarterectomy  08/26/2011    Procedure: ENDARTERECTOMY ILIAC;  Surgeon: [name], MD;  Location: [facility] OR;  Service: Vascular;  Laterality: Right;  Right Femoral Artery Endarterectomy with vascu guard patch angioplasty & intraoperative arteriogram.  . Coronary artery bypass graft  10/31/85 & 08/19/06  . Cardiac catheterization  07/28/2012    Native 3v CAD, continued patency of the SVG-OM1-LAD and SVG-OM2-left PDA. LVEF 40% with multiple WMAs    History  Smoking status  . Never Smoker   Smokeless tobacco  . Never Used    History  Alcohol Use No    Family History  Problem Relation Age of Onset  . Heart disease Father   . Cancer Mother   . COPD Mother   . Anesthesia problems Neg Hx   . Deep vein thrombosis Brother   . COPD Brother   . CAD Brother     Review of Systems: The review of systems is per the HPI.  All other systems were reviewed and are negative.  Physical Exam: BP 160/70  Pulse 60  Ht 5\' 7"  (1.702 m)  Wt 135 lb (61.236 kg)  BMI 21.14 kg/m2 Patient is very pleasant and in no acute distress. Looks younger than his stated age. Skin is warm and dry. Color is normal.  HEENT is unremarkable. Normocephalic/atraumatic. PERRL. Sclera are nonicteric. Neck is supple. No masses. No JVD. Lungs are clear. Cardiac exam shows a regular rate and rhythm. Abdomen is soft. Extremities are without edema. Gait and ROM are intact. No gross neurologic deficits noted.  LABORATORY DATA:  Lab Results  Component Value Date   WBC 5.2 07/28/2012   HGB 12.8* 07/28/2012   HCT 35.8* 07/28/2012   PLT 132* 07/28/2012   GLUCOSE 97 07/28/2012   CHOL 87 10/18/2011   TRIG 83 10/18/2011   HDL 36* 10/18/2011   LDLCALC 34 10/18/2011   ALT 20 07/27/2012   AST 21 07/27/2012   NA 139 07/28/2012   K 4.4 07/28/2012   CL 104 07/28/2012   CREATININE 1.35 07/28/2012   BUN 20 07/28/2012   CO2 27 07/28/2012   TSH 1.930 10/17/2011   INR 1.05  07/27/2012    Coronary angiography:   Left mainstem: Severely diseased with 95% stenosis.  Left anterior descending (LAD): Total occlusion of  the proximal vessel.  Left circumflex (LCx): Total occlusion of the proximal vessel.  Right coronary artery (RCA): Small, nondominant vessel. No obstructive disease noted  Saphenous vein graft to first OM: The graft is patent. There is 60% smooth stenosis in the distal body of the graft. The target vessel is small.  Y. graft to mid LAD arising from body of the vein graft to OM: This graft is widely patent with no significant areas of disease. The LAD is extremely small in caliber and diffusely diseased. The apical LAD has 95% stenosis. The first diagonal is moderate in caliber and fills retrograde from the LAD graft. There is 70% stenosis in the diagonal.  Sequential vein graft to OM 2 and left PDA: Widely patent without significant stenosis. Both target vessels are large in caliber and have no significant disease.  The LIMA to LAD is known to be atretic and it was not injected today.   Left ventriculography: There is severe hypokinesis of the mid and distal anterior walls. There is akinesis of the true apex. There is akinesis of the infero-apex. The basal and midinferior walls in the basal anterior wall contract normally. The estimated left ventricular ejection fraction is 40%.   Final Conclusions:  1. Severe native three-vessel coronary artery disease  2. Continued patency of the saphenous vein graft to first OM and saphenous vein graft to LAD and saphenous vein graft sequence to OM 2 and left PDA  3. Moderate segmental left ventricular systolic dysfunction.   Recommendations: The patient's coronary anatomy is stable. I compared his films to his previous study from 2010. I would recommend continued medical therapy.  [name]  07/28/2012, 2:41 PM    Assessment / Plan: 1. CAD - with recent cath - stable coronary anatomy - continued with medical  management. Clinically he is improved.   2. HTN - will change from Diovan to Losartan when his current supply of Diovan runs out. He will continue to monitor at home.   3. Tachycardia with bifascicular block - no symptoms reported.   We will see him back in June as planned. Encouraged CV risk factor modification.   Patient is agreeable to this plan and will call if any problems develop in the interim.   Rosalio Macadamia, RN, ANP-C [facility] [address] [place], [place]  [address]

## 2012-09-01 ENCOUNTER — Encounter: Payer: Self-pay | Admitting: Vascular Surgery

## 2012-09-02 ENCOUNTER — Encounter: Payer: Self-pay | Admitting: Vascular Surgery

## 2012-09-02 ENCOUNTER — Ambulatory Visit (INDEPENDENT_AMBULATORY_CARE_PROVIDER_SITE_OTHER): Payer: Medicare PPO | Admitting: Vascular Surgery

## 2012-09-02 ENCOUNTER — Encounter (INDEPENDENT_AMBULATORY_CARE_PROVIDER_SITE_OTHER): Payer: Medicare PPO | Admitting: *Deleted

## 2012-09-02 VITALS — BP 155/69 | HR 90 | Resp 16 | Ht 67.0 in | Wt 136.0 lb

## 2012-09-02 DIAGNOSIS — I872 Venous insufficiency (chronic) (peripheral): Secondary | ICD-10-CM | POA: Insufficient documentation

## 2012-09-02 DIAGNOSIS — R609 Edema, unspecified: Secondary | ICD-10-CM

## 2012-09-02 DIAGNOSIS — M25473 Effusion, unspecified ankle: Secondary | ICD-10-CM | POA: Insufficient documentation

## 2012-09-02 NOTE — Progress Notes (Signed)
[facility] [place]  Referred by:  Jarome Matin, MD [phone] [facility] [place], [place] [address]  Reason for referral: Swollen right leg  History of Present Illness  Karl Stevenson is a 79 y.o. (06/13/1926) male who presents with chief complaint: swollen right leg.  Patient notes, onset of swelling ~1.5 months ago, associated with no obvious trigger.  The patient notes rapid onset of right ankle swelling without significant associated sx.  The patient has had nohistory of DVT, no history of varicose vein, no history of venous stasis ulcers, no history of  Lymphedema and no history of skin changes in lower legs.  There is no family history of venous disorders.  The patient has never used compression stockings in the past.  Past Medical History  Diagnosis Date  . Hyperlipidemia   . Hypertension   . Carotid artery occlusion     s/p L CEA in 2010  . Crohn's disease     colon resection  . Bradycardia     previous intolerance to beta blockers - low dose toprol started 10/2011 for tachycardia but had rash and was discontinued  . Chronic systolic heart failure     echo 7/12: EF 35-40%, anteroseptal hypokinesis, mild MR.  . Ischemic cardiomyopathy     EF 34% (Lexiscan 06/2011), 35-40% (echo 11/2010)  . Coronary artery disease     CABG 1987, redo 2008; Last LHC 1/10: SVG-OM 1/OM 2 patent, SVG-LAD patent, SVG-OM 3 Patent  . Peripheral vascular disease     Carotid dz s/p L CEA, RAS, AAA, LE dz  . Renal artery stenosis     Dopplers 12/2010 - 1-59% R ostial renal artery stenosis, 2 L renal arteries both widely patent  . Gouty arthritis   . AAA (abdominal aortic aneurysm)     Ectatic abdominal aorta with fusiform dilitation 3.4x3.4 and moderate thrombus burden 12/2010  . RBBB   . Tachycardia     a. 10/2011 - atrial tach vs avnrt - BB started.  . Myocardial infarction   . COPD (chronic obstructive pulmonary disease)     Past Surgical History   Procedure Laterality Date  . Carotid endarterectomy  2010    left  . Colon resection    . Tonsillectomy      as a child  . Pr vein bypass graft,aorto-fem-pop  10/1985  . Vein bypass surgery      LEFT   06/2011  . Endarterectomy  08/26/2011    Procedure: ENDARTERECTOMY ILIAC;  Surgeon: [name], MD;  [facility];  Service: Vascular;  Laterality: Right;  Right Femoral Artery Endarterectomy with vascu guard patch angioplasty & intraoperative arteriogram.  . Coronary artery bypass graft  10/31/85 & 08/19/06  . Cardiac catheterization  07/28/2012    Native 3v CAD, continued patency of the SVG-OM1-LAD and SVG-OM2-left PDA. LVEF 40% with multiple WMAs    History   Social History  . Marital Status: Married    Spouse Name: N/A    Number of Children: N/A  . Years of Education: N/A   Occupational History  . Not on file.   Social History Main Topics  . Smoking status: Never Smoker   . Smokeless tobacco: Never Used  . Alcohol Use: No  . Drug Use: No  . Sexually Active: Yes   Other Topics Concern  . Not on file   Social History Narrative  . No narrative on file    Family History  Problem Relation  Age of Onset  . Heart disease Father   . Cancer Mother   . COPD Mother   . Anesthesia problems Neg Hx   . Deep vein thrombosis Brother   . COPD Brother   . CAD Brother     Current Outpatient Prescriptions on File Prior to Visit  Medication Sig Dispense Refill  . acetaminophen (TYLENOL) 500 MG tablet Take 1,000 mg by mouth every 6 (six) hours as needed for pain.      [name] amLODipine (NORVASC) 5 MG tablet Take 1 tablet (5 mg total) by mouth daily.  30 tablet  6  . aspirin 81 MG tablet Take 81 mg by mouth daily.       [name] atorvastatin (LIPITOR) 10 MG tablet Take 10 mg by mouth at bedtime.       . clopidogrel (PLAVIX) 75 MG tablet Take 75 mg by mouth daily.      [name] COLCRYS 0.6 MG tablet Take 0.6 mg by mouth daily as needed. For gout      . fexofenadine (ALLEGRA) 180 MG tablet Take 180  mg by mouth daily as needed (seasonal allergies).       [name] ipratropium (ATROVENT) 0.03 % nasal spray Place 1 spray into the nose daily.       . isosorbide mononitrate (IMDUR) 60 MG 24 hr tablet Take 1 tablet (60 mg total) by mouth 2 (two) times daily.  180 tablet  3  . losartan (COZAAR) 100 MG tablet Take 1 tablet (100 mg total) by mouth daily.  90 tablet  3  . Multiple Vitamin (MULITIVITAMIN WITH MINERALS) TABS Take 1 tablet by mouth every evening.       . nitroGLYCERIN (NITROSTAT) 0.4 MG SL tablet Place 0.4 mg under the tongue every 5 (five) minutes as needed for chest pain.      . pantoprazole (PROTONIX) 40 MG tablet Take 1 tablet (40 mg total) by mouth daily.  90 tablet  3  . ranolazine (RANEXA) 500 MG 12 hr tablet Take 500 mg by mouth 2 (two) times daily.       No current facility-administered medications on file prior to visit.    Allergies  Allergen Reactions  . Novocain (Procaine Hcl) Other (See Comments)    Cold sweats and very bad headache.   . Metoprolol Hives, Itching and Rash    REVIEW OF SYSTEMS:  (Positives checked otherwise negative)  CARDIOVASCULAR:  [ ]  chest pain, [ ]  chest pressure, [ ]  palpitations, [ ]  shortness of breath when laying flat, [ ]  shortness of breath with exertion,   [ ]  pain in feet when walking, [ ]  pain in feet when laying flat, [ ]  history of blood clot in veins (DVT), [ ]  history of phlebitis, [x]  swelling in legs, [ ]  varicose veins  PULMONARY:  [ ]  productive cough, [ ]  asthma, [ ]  wheezing  NEUROLOGIC:  [ ]  weakness in arms or legs, [ ]  numbness in arms or legs, [ ]  difficulty speaking or slurred speech, [ ]  temporary loss of vision in one eye, [ ]  dizziness  HEMATOLOGIC:  [ ]  bleeding problems, [ ]  problems with blood clotting too easily  MUSCULOSKEL:  [ ]  joint pain, [ ]  joint swelling  GASTROINTEST:  [ ]   Vomiting blood, [ ]   Blood in stool     GENITOURINARY:  [ ]   Burning with urination, [ ]   Blood in urine  PSYCHIATRIC:  [ ]  history of  major depression  INTEGUMENTARY:  [ ]   rashes, [ ]  ulcers  CONSTITUTIONAL:  [ ]  fever, [ ]  chills  Physical Examination  Filed Vitals:   09/02/12 1535  BP: 155/69  Pulse: 90  Resp: 16  Height: 5\' 7"  (1.702 m)  Weight: 136 lb (61.689 kg)   Body mass index is 21.3 kg/(m^2).  General: A&O x 3, WD, thin  Head: Pax/AT  Ear/Nose/Throat: Hearing grossly intact, nares w/o erythema or drainage, oropharynx w/o Erythema/Exudate  Eyes: PERRLA, EOMI  Neck: Supple, no nuchal rigidity, no palpable LAD  Pulmonary: Sym exp, good air movt, CTAB, no rales, rhonchi, & wheezing  Cardiac: RRR, Nl S1, S2, no Murmurs, rubs or gallops  Vascular: Vessel Right Left  Radial Palpable Palpable  Ulnar Palpable Palpable  Brachial Palpable Palpable  Carotid Palpable, without bruit Palpable, without bruit  Aorta Not palpable N/A  Femoral Palpable Palpable  Popliteal Not palpable Not palpable  PT  Palpable  Palpable  DP  Palpable  Palpable   Gastrointestinal: soft, NTND, -G/R, - HSM, - masses, - CVAT B  Musculoskeletal: M/S 5/5 throughout , Extremities without ischemic changes , L groin incision well healed, R groin well healed with ballotable chronic seroma, small varicosities in BLE, no LDS, no edema  Neurologic: CN 2-12 intact , Pain and light touch intact in extremities , Motor exam as listed above  Psychiatric: Judgment intact, Mood & affect appropriate for pt's clinical situation  Dermatologic: See M/S exam for extremity exam, no rashes otherwise noted  Lymph : No Cervical, Axillary, or Inguinal lymphadenopathy   Non-Invasive Vascular Imaging  BLE Venous Insufficiency Duplex (Date: 09/02/12):   RLE: R groin complex cystic structure  Mild R SFV reflux, intact GSV  Outside Studies/Documentation 6 pages of outside documents were reviewed including: outside venous duplex which was negative.  Medical Decision Making  Karl Stevenson is a 79 y.o. male who presents with: mild chronic  venous insufficiency (C2).   Based on the patient's history and examination, I recommend: OTC (15 mm Hg) compression stocking to R leg.  As the patient is relatively asx, I doubt any benefit to higher grade compression..  The patient will follow up US as needed.  Thank you for allowing [place] to participate in this patient's care.  Leonides Sake, MD [facility] [place] Office: [phone] Pager: [phone]  09/02/2012, 5:30 PM

## 2012-09-05 ENCOUNTER — Encounter: Payer: Self-pay | Admitting: Internal Medicine

## 2012-09-26 ENCOUNTER — Telehealth: Payer: Self-pay | Admitting: Cardiovascular Disease

## 2012-09-26 NOTE — Telephone Encounter (Signed)
New problem     Status of cardiac clearance . Surgery on Monday  . Fax results sent on  4/7.

## 2012-09-26 NOTE — Telephone Encounter (Signed)
I have not been able to locate surgical clearance form in office. Office is for [facility].  Office closed until 1:00. Will try to reach later today

## 2012-09-26 NOTE — Telephone Encounter (Signed)
I spoke with Marcelino Duster at Dr. Vonna Kotyk' office. She will refax form today. Pt is scheduled for cataract extraction with light sedation

## 2012-09-26 NOTE — Telephone Encounter (Signed)
Form received in office. Will leave on Dr. Earmon Phoenix cart

## 2012-09-27 NOTE — Telephone Encounter (Signed)
Follow up    Patient calling back to check on the status of cardiac clearance. Surgery on Monday .

## 2012-09-27 NOTE — Telephone Encounter (Signed)
Notified pt that [name] and Dr. Excell Seltzer are not working in the office today, will be in tomorrow and address clearance.

## 2012-09-28 NOTE — Telephone Encounter (Signed)
Left message on pt's machine that he is cleared for cataract surgery.

## 2012-09-29 ENCOUNTER — Telehealth: Payer: Self-pay | Admitting: Cardiovascular Disease

## 2012-09-29 NOTE — Telephone Encounter (Signed)
Will forward message to Selena Batten in Medical Records.

## 2012-09-29 NOTE — Telephone Encounter (Signed)
[facility]/Cardiac Clearance Re-Faxed To [phone] & [phone]

## 2012-09-29 NOTE — Telephone Encounter (Signed)
Follow-up:    Called in wanting the patient's completed surgical clearance form faxed back to their office at [phone].  Please fax back today.

## 2012-10-18 ENCOUNTER — Ambulatory Visit (INDEPENDENT_AMBULATORY_CARE_PROVIDER_SITE_OTHER): Payer: Medicare PPO | Admitting: Cardiovascular Disease

## 2012-10-18 ENCOUNTER — Encounter: Payer: Self-pay | Admitting: Cardiovascular Disease

## 2012-10-18 VITALS — BP 108/60 | HR 58 | Ht 67.0 in | Wt 133.0 lb

## 2012-10-18 DIAGNOSIS — I2510 Atherosclerotic heart disease of native coronary artery without angina pectoris: Secondary | ICD-10-CM

## 2012-10-18 NOTE — Patient Instructions (Signed)
Your physician recommends that you schedule a follow-up appointment in: 3 MONTHS with Norma Fredrickson NP  Your physician recommends that you continue on your current medications as directed. Please refer to the Current Medication list given to you today.

## 2012-10-18 NOTE — Progress Notes (Signed)
HPI:  79 year old gentleman presenting for followup evaluation. He has extensive cardiovascular disease. He's had 2 coronary bypass surgeries, first in 1987 and redo CABG in 2008. He also has peripheral arterial disease with multiple peripheral vascular surgeries. He underwent cardiac catheterization earlier this year that showed stable coronary anatomy and medical therapy was recommended. This demonstrated patency of the saphenous vein graft to OM, saphenous vein graft to LAD, and sequenced vein graft to the OM 2 and left PDA.  He's been doing much better over the past few months. His anginal symptoms have resolved with an increase in his Imdur dosing. He's had some leg swelling and started wearing compression socks. He's been able to maintain his activity level without difficulty or exertional symptoms. He brings in a print out of his home blood pressure readings which have been good. His systolics are ranging from the 130s into the low 150s and diastolics are in the 60s. Heart rate is in the 60s on average.  Outpatient Encounter Prescriptions as of 10/18/2012  Medication Sig Dispense Refill  . acetaminophen (TYLENOL) 500 MG tablet Take 1,000 mg by mouth every 6 (six) hours as needed for pain.      [name] amLODipine (NORVASC) 5 MG tablet Take 1 tablet (5 mg total) by mouth daily.  30 tablet  6  . aspirin 81 MG tablet Take 81 mg by mouth daily.       [name] atorvastatin (LIPITOR) 10 MG tablet Take 10 mg by mouth at bedtime.       [name] BESIVANCE 0.6 % SUSP       . clopidogrel (PLAVIX) 75 MG tablet Take 75 mg by mouth daily.      [name] COLCRYS 0.6 MG tablet Take 0.6 mg by mouth daily as needed. For gout      . DIOVAN 80 MG tablet       . DUREZOL 0.05 % EMUL       . fexofenadine (ALLEGRA) 180 MG tablet Take 180 mg by mouth daily as needed (seasonal allergies).       [name] ipratropium (ATROVENT) 0.03 % nasal spray Place 1 spray into the nose daily.       . isosorbide mononitrate (IMDUR) 60 MG 24 hr tablet Take 1 tablet  (60 mg total) by mouth 2 (two) times daily.  180 tablet  3  . Multiple Vitamin (MULITIVITAMIN WITH MINERALS) TABS Take 1 tablet by mouth every evening.       [name] NEVANAC 0.1 % ophthalmic suspension       . nitroGLYCERIN (NITROSTAT) 0.4 MG SL tablet Place 0.4 mg under the tongue every 5 (five) minutes as needed for chest pain.      . pantoprazole (PROTONIX) 40 MG tablet Take 1 tablet (40 mg total) by mouth daily.  90 tablet  3  . propranolol (INDERAL) 10 MG tablet       . ranolazine (RANEXA) 500 MG 12 hr tablet Take 500 mg by mouth 2 (two) times daily.      [name] losartan (COZAAR) 100 MG tablet Take 1 tablet (100 mg total) by mouth daily.  90 tablet  3   No facility-administered encounter medications on file as of 10/18/2012.    Allergies  Allergen Reactions  . Novocain (Procaine Hcl) Other (See Comments)    Cold sweats and very bad headache.   . Metoprolol Hives, Itching and Rash    Past Medical History  Diagnosis Date  . Hyperlipidemia   . Hypertension   . Carotid  artery occlusion     s/p L CEA in 2010  . Crohn's disease     colon resection  . Bradycardia     previous intolerance to beta blockers - low dose toprol started 10/2011 for tachycardia but had rash and was discontinued  . Chronic systolic heart failure     echo 7/12: EF 35-40%, anteroseptal hypokinesis, mild MR.  . Ischemic cardiomyopathy     EF 34% (Lexiscan 06/2011), 35-40% (echo 11/2010)  . Coronary artery disease     CABG 1987, redo 2008; Last LHC 1/10: SVG-OM 1/OM 2 patent, SVG-LAD patent, SVG-OM 3 Patent  . Peripheral vascular disease     Carotid dz s/p L CEA, RAS, AAA, LE dz  . Renal artery stenosis     Dopplers 12/2010 - 1-59% R ostial renal artery stenosis, 2 L renal arteries both widely patent  . Gouty arthritis   . AAA (abdominal aortic aneurysm)     Ectatic abdominal aorta with fusiform dilitation 3.4x3.4 and moderate thrombus burden 12/2010  . RBBB   . Tachycardia     a. 10/2011 - atrial tach vs avnrt - BB  started.  . Myocardial infarction   . COPD (chronic obstructive pulmonary disease)     ROS: Negative except as per HPI  BP 108/60  Pulse 58  Ht 5\' 7"  (1.702 m)  Wt 60.328 kg (133 lb)  BMI 20.83 kg/m2  SpO2 99%  PHYSICAL EXAM: Pt is alert and oriented, pleasant elderly male in NAD HEENT: normal Neck: JVP - normal, carotids 2+= without bruits Lungs: CTA bilaterally CV: RRR without murmur or gallop Abd: soft, NT, Positive BS, no hepatomegaly Ext: 1+ ankle edema on the right, trace edema on the left Skin: warm/dry no rash  ASSESSMENT AND PLAN: 1. Coronary artery disease. Stable anatomy with patent grafts and recent cardiac catheterization. Anginal symptoms are better with increased isosorbide. I would like him to see Lawson Fiscal back in 3 months.  2. Hypertension. Blood pressure is well controlled and I reviewed his home readings today. He'll continue his same program.  3. Hyperlipidemia. Patient takes Lipitor. Lipids last year showed a cholesterol of 87, triglycerides 83, HDL 36, and LDL 34.  4. Abdominal aortic aneurysm and lower extremity PAD. He's followed by Dr. Imogene Burn with Vascular Surgery.  [name] 10/18/2012 11:12 AM

## 2012-11-02 ENCOUNTER — Encounter (INDEPENDENT_AMBULATORY_CARE_PROVIDER_SITE_OTHER): Payer: Self-pay | Admitting: Pharmacist

## 2012-11-02 DIAGNOSIS — Z7189 Other specified counseling: Secondary | ICD-10-CM

## 2012-11-02 NOTE — Progress Notes (Unsigned)
Pt comes into clinic today for complete medication review.  Pt brought all of his medications in with him today.  Reviewed medication list provided in Epic, list provided through [facility], and pt's medication bottles.  List in Epic reconciled to what pt is currently taking.  Pt reports no problems with his current therapy.    Reviewed list for any unnecessary prescriptions as well as any additional needs.  Hx of CAD and CHF.  Compliant with ARB/statin/antiplatelet therapy.  Only on beta blocker prn for elevated HR and states he only has to take it a few times a month.   Has angina associated with CAD.  Taking Imdur BID and Ranexa BID.   He keeps his NTG SL tabs in his pocket.  States he did have to take his NTG on a fairly regular basis prior to Imdur being increased from once to twice daily but angina is much more controlled now.  Re-educated on importance of calling 911 if he takes 2 NTG in a row with no relief.    Pt has a history of gout/neuropathy.  He takes colchicine about 3 days a week for this.  I do not have access to any uric acid levels, but he states Dr. Jarold Motto checks this and they have been fine.  Concern for drug interaction between colchicine and Ranexa- may result in increase levels of colchicine.  Pt does report some diarrhea but ? Related to Crohn's versus colchicine.  He has never noticed a difference in when he has diarrhea in relation to when he takes his colchicine.  He will start to monitor and contact PCP if he has any additional issues.    Reviewed pt's most recent labs.  BMET, CBC, LFTs all stable.  No dosing adjustments of medications required based on renal or hepatic function.    Current Outpatient Prescriptions on File Prior to Visit  Medication Sig Dispense Refill  . acetaminophen (TYLENOL) 500 MG tablet Take 1,000 mg by mouth every 6 (six) hours as needed for pain.      [name] amLODipine (NORVASC) 5 MG tablet Take 1 tablet (5 mg total) by mouth daily.  30 tablet  6  .  aspirin 81 MG tablet Take 81 mg by mouth daily.       [name] atorvastatin (LIPITOR) 10 MG tablet Take 10 mg by mouth at bedtime.       . clopidogrel (PLAVIX) 75 MG tablet Take 75 mg by mouth daily.      [name] COLCRYS 0.6 MG tablet Take 0.6 mg by mouth daily as needed. For gout      . fexofenadine (ALLEGRA) 180 MG tablet Take 180 mg by mouth daily as needed (seasonal allergies).       [name] ipratropium (ATROVENT) 0.03 % nasal spray Place 1 spray into the nose daily.       . isosorbide mononitrate (IMDUR) 60 MG 24 hr tablet Take 1 tablet (60 mg total) by mouth 2 (two) times daily.  180 tablet  3  . losartan (COZAAR) 100 MG tablet Take 1 tablet (100 mg total) by mouth daily.  90 tablet  3  . nitroGLYCERIN (NITROSTAT) 0.4 MG SL tablet Place 0.4 mg under the tongue every 5 (five) minutes as needed for chest pain.      . pantoprazole (PROTONIX) 40 MG tablet Take 1 tablet (40 mg total) by mouth daily.  90 tablet  3  . propranolol (INDERAL) 10 MG tablet Take 1 tablet as needed for heart rate >  110      . ranolazine (RANEXA) 500 MG 12 hr tablet Take 500 mg by mouth 2 (two) times daily.       No current facility-administered medications on file prior to visit.     UPDATED MEDICATION LIST Current Outpatient Prescriptions  Medication Sig Dispense Refill  . acetaminophen (TYLENOL) 500 MG tablet Take 1,000 mg by mouth every 6 (six) hours as needed for pain.      [name] amLODipine (NORVASC) 5 MG tablet Take 1 tablet (5 mg total) by mouth daily.  30 tablet  6  . aspirin 81 MG tablet Take 81 mg by mouth daily.       [name] atorvastatin (LIPITOR) 10 MG tablet Take 10 mg by mouth at bedtime.       . clopidogrel (PLAVIX) 75 MG tablet Take 75 mg by mouth daily.      [name] COLCRYS 0.6 MG tablet Take 0.6 mg by mouth daily as needed. For gout      . fexofenadine (ALLEGRA) 180 MG tablet Take 180 mg by mouth daily as needed (seasonal allergies).       [name] ipratropium (ATROVENT) 0.03 % nasal spray Place 1 spray into the nose daily.       .  isosorbide mononitrate (IMDUR) 60 MG 24 hr tablet Take 1 tablet (60 mg total) by mouth 2 (two) times daily.  180 tablet  3  . losartan (COZAAR) 100 MG tablet Take 1 tablet (100 mg total) by mouth daily.  90 tablet  3  . nitroGLYCERIN (NITROSTAT) 0.4 MG SL tablet Place 0.4 mg under the tongue every 5 (five) minutes as needed for chest pain.      . pantoprazole (PROTONIX) 40 MG tablet Take 1 tablet (40 mg total) by mouth daily.  90 tablet  3  . propranolol (INDERAL) 10 MG tablet Take 1 tablet as needed for heart rate >110      . ranolazine (RANEXA) 500 MG 12 hr tablet Take 500 mg by mouth 2 (two) times daily.       No current facility-administered medications for this visit.   Assessment/Plan Pt currently on appropriate therapy.  He is not complaining of any side effects.  His lab work is stable.  He is already taking generic medications when available.  Would not suggest any changes at this time.  Pt aware he can call with any problems in the future.

## 2012-12-04 ENCOUNTER — Other Ambulatory Visit: Payer: Self-pay | Admitting: Nurse Practitioner

## 2012-12-19 ENCOUNTER — Other Ambulatory Visit: Payer: Self-pay | Admitting: Cardiovascular Disease

## 2013-01-02 ENCOUNTER — Telehealth: Payer: Self-pay | Admitting: Nurse Practitioner

## 2013-01-02 MED ORDER — AMLODIPINE BESYLATE 5 MG PO TABS: ORAL_TABLET | ORAL | Status: DC

## 2013-01-02 MED ORDER — RANOLAZINE ER 500 MG PO TB12: 500.0000 mg | ORAL_TABLET | Freq: Two times a day (BID) | ORAL | Status: DC

## 2013-01-02 NOTE — Telephone Encounter (Signed)
New Prob  Pt would like to speak with a nurse regarding his Ranexa 500 MG.

## 2013-01-02 NOTE — Telephone Encounter (Signed)
Pt will advise [employer] he is already taking amlodipine. I have refilled amlodipine and ranexa for 90 days.

## 2013-01-02 NOTE — Telephone Encounter (Signed)
Pt states he got letter [employer] (his insurance) asking if atenolol,metoprolol tartrate, amlodipine would be an appropriate lower cost alternative for pt in the place of Ranexa.

## 2013-01-18 ENCOUNTER — Ambulatory Visit (INDEPENDENT_AMBULATORY_CARE_PROVIDER_SITE_OTHER): Payer: Medicare PPO | Admitting: Nurse Practitioner

## 2013-01-18 ENCOUNTER — Encounter: Payer: Self-pay | Admitting: Nurse Practitioner

## 2013-01-18 VITALS — BP 122/66 | HR 64 | Ht 67.0 in | Wt 133.4 lb

## 2013-01-18 DIAGNOSIS — I2510 Atherosclerotic heart disease of native coronary artery without angina pectoris: Secondary | ICD-10-CM

## 2013-01-18 NOTE — Patient Instructions (Addendum)
I think you are doing well.  See Dr. Excell Seltzer in 4 months  Stay active  No change in your medicines  Make sure you get a flu shot this season  Call the [facility] office at [phone] if you have any questions, problems or concerns.

## 2013-01-18 NOTE — Progress Notes (Signed)
[name] Date of Birth: 1926-10-26 Medical Record #[id number]  History of Present Illness: Mr. Vanallen is seen back today for a 3 month check. Seen for Dr. Excell Seltzer. Former Dr. Deborah Chalk patient. He has multiple issues which include CAD with 2 prior CABGs, first in 1987 and the 2nd in 2008. Was failing on medical therapy due to refractory angina earlier this year and underwent cardiac cath - had stable coronary anatomy.  Medical management will be continued. EF remains 40%. Has known PVD with multiple PV procedures. Other issues include AAA, HTN, and HLD. Last stress test in February of 2013. EF was 40%. Other issues include bradycardia and tachycardia with a bifascicular block but has not required PTVP implant. He uses prn propranolol if his heart rate is elevated.   Last seen in June. Doing well at that time.  Comes back today. Here alone. Doing well. Has had to use some propranolol a few times with good results. No chest pain. Not short of breath. Feels good on his medicines. Walking daily. Tolerating his medicines but in the donut hole with his Ranexa. Seeing PCP in December.   Current Outpatient Prescriptions  Medication Sig Dispense Refill  . acetaminophen (TYLENOL) 500 MG tablet Take 1,000 mg by mouth every 6 (six) hours as needed for pain.      [name] amLODipine (NORVASC) 5 MG tablet TAKE ONE TABLET BY MOUTH EVERY DAY  90 tablet  3  . aspirin 81 MG tablet Take 81 mg by mouth daily.       [name] atorvastatin (LIPITOR) 10 MG tablet Take 10 mg by mouth at bedtime.       [name] BESIVANCE 0.6 % SUSP       . clopidogrel (PLAVIX) 75 MG tablet TAKE ONE TABLET BY MOUTH EVERY DAY  90 tablet  3  . COLCRYS 0.6 MG tablet Take 0.6 mg by mouth daily as needed. For gout      . fexofenadine (ALLEGRA) 180 MG tablet Take 180 mg by mouth daily as needed (seasonal allergies).       [name] ipratropium (ATROVENT) 0.03 % nasal spray Place 1 spray into the nose daily.       . isosorbide mononitrate (IMDUR) 60 MG 24 hr tablet  Take 1 tablet (60 mg total) by mouth 2 (two) times daily.  180 tablet  3  . losartan (COZAAR) 100 MG tablet Take 1 tablet (100 mg total) by mouth daily.  90 tablet  3  . nitroGLYCERIN (NITROSTAT) 0.4 MG SL tablet Place 0.4 mg under the tongue every 5 (five) minutes as needed for chest pain.      . pantoprazole (PROTONIX) 40 MG tablet Take 1 tablet (40 mg total) by mouth daily.  90 tablet  3  . propranolol (INDERAL) 10 MG tablet Take 1 tablet as needed for heart rate >110      . ranolazine (RANEXA) 500 MG 12 hr tablet Take 1 tablet (500 mg total) by mouth 2 (two) times daily.  180 tablet  3  . vitamin B-12 (CYANOCOBALAMIN) 100 MCG tablet Take 50 mcg by mouth daily.       No current facility-administered medications for this visit.    Allergies  Allergen Reactions  . Novocain [Procaine Hcl] Other (See Comments)    Cold sweats and very bad headache.   . Metoprolol Hives, Itching and Rash    Past Medical History  Diagnosis Date  . Hyperlipidemia   . Hypertension   . Carotid artery occlusion  s/p L CEA in 2010  . Crohn's disease     colon resection  . Bradycardia     previous intolerance to beta blockers - low dose toprol started 10/2011 for tachycardia but had rash and was discontinued  . Chronic systolic heart failure     echo 7/12: EF 35-40%, anteroseptal hypokinesis, mild MR.  . Ischemic cardiomyopathy     EF 34% (Lexiscan 06/2011), 35-40% (echo 11/2010)  . Coronary artery disease     CABG 1987, redo 2008; Last LHC 1/10: SVG-OM 1/OM 2 patent, SVG-LAD patent, SVG-OM 3 Patent  . Peripheral vascular disease     Carotid dz s/p L CEA, RAS, AAA, LE dz  . Renal artery stenosis     Dopplers 12/2010 - 1-59% R ostial renal artery stenosis, 2 L renal arteries both widely patent  . Gouty arthritis   . AAA (abdominal aortic aneurysm)     Ectatic abdominal aorta with fusiform dilitation 3.4x3.4 and moderate thrombus burden 12/2010  . RBBB   . Tachycardia     a. 10/2011 - atrial tach vs avnrt  - BB started.  . Myocardial infarction   . COPD (chronic obstructive pulmonary disease)     Past Surgical History  Procedure Laterality Date  . Carotid endarterectomy  2010    left  . Colon resection    . Tonsillectomy      as a child  . Pr vein bypass graft,aorto-fem-pop  10/1985  . Vein bypass surgery      LEFT   06/2011  . Endarterectomy  08/26/2011    Procedure: ENDARTERECTOMY ILIAC;  Surgeon: [name], MD;  Location: [facility] OR;  Service: Vascular;  Laterality: Right;  Right Femoral Artery Endarterectomy with vascu guard patch angioplasty & intraoperative arteriogram.  . Coronary artery bypass graft  10/31/85 & 08/19/06  . Cardiac catheterization  07/28/2012    Native 3v CAD, continued patency of the SVG-OM1-LAD and SVG-OM2-left PDA. LVEF 40% with multiple WMAs    History  Smoking status  . Never Smoker   Smokeless tobacco  . Never Used    History  Alcohol Use No    Family History  Problem Relation Age of Onset  . Heart disease Father   . Cancer Mother   . COPD Mother   . Anesthesia problems Neg Hx   . Deep vein thrombosis Brother   . COPD Brother   . CAD Brother     Review of Systems: The review of systems is per the HPI.  All other systems were reviewed and are negative.  Physical Exam: BP 122/66  Pulse 64  Ht 5\' 7"  (1.702 m)  Wt 133 lb 6.4 oz (60.51 kg)  BMI 20.89 kg/m2 Patient is very pleasant and in no acute distress. Skin is warm and dry. Color is normal.  HEENT is unremarkable. Normocephalic/atraumatic. PERRL. Sclera are nonicteric. Neck is supple. No masses. No JVD. Lungs are clear. Cardiac exam shows a regular rate and rhythm. Abdomen is soft. Extremities are without edema. Gait and ROM are intact. No gross neurologic deficits noted.  LABORATORY DATA:  Lab Results  Component Value Date   WBC 5.2 07/28/2012   HGB 12.8* 07/28/2012   HCT 35.8* 07/28/2012   PLT 132* 07/28/2012   GLUCOSE 97 07/28/2012   CHOL 87 10/18/2011   TRIG 83 10/18/2011   HDL 36*  10/18/2011   LDLCALC 34 10/18/2011   ALT 20 07/27/2012   AST 21 07/27/2012   NA 139 07/28/2012   K  4.4 07/28/2012   CL 104 07/28/2012   CREATININE 1.35 07/28/2012   BUN 20 07/28/2012   CO2 27 07/28/2012   TSH 1.930 10/17/2011   INR 1.05 07/27/2012     Assessment / Plan: 1. CAD - managed medically - doing well clinically. Will have Dr. Excell Seltzer see him in 4 months.   2. HTN - BP looks good on current therapy. He does a nice job with monitoring at home. No change in current therapy.  3. HLD - on statin therapy - labs checked by PCP.  4. PVD - followed by [facility].   5. Tachycardia/bradycardia with bifascicular block- using prn Inderal with good results. No passing out or dizzy spells reported.   Patient is agreeable to this plan and will call if any problems develop in the interim.   Rosalio Macadamia, RN, ANP-C [facility] [address] [place], [place]  [address]

## 2013-02-09 ENCOUNTER — Encounter (HOSPITAL_COMMUNITY): Payer: Self-pay | Admitting: Emergency Medicine

## 2013-02-09 ENCOUNTER — Inpatient Hospital Stay (HOSPITAL_COMMUNITY)
Admission: EM | Admit: 2013-02-09 | Discharge: 2013-02-14 | DRG: 389 | Disposition: A | Discharge status: Home / Self Care | Payer: Medicare PPO | Attending: Internal Medicine | Admitting: Internal Medicine

## 2013-02-09 DIAGNOSIS — K5090 Crohn's disease, unspecified, without complications: Secondary | ICD-10-CM | POA: Diagnosis present

## 2013-02-09 DIAGNOSIS — I739 Peripheral vascular disease, unspecified: Secondary | ICD-10-CM | POA: Diagnosis present

## 2013-02-09 DIAGNOSIS — Z7982 Long term (current) use of aspirin: Secondary | ICD-10-CM

## 2013-02-09 DIAGNOSIS — I4510 Unspecified right bundle-branch block: Secondary | ICD-10-CM | POA: Diagnosis present

## 2013-02-09 DIAGNOSIS — I5022 Chronic systolic (congestive) heart failure: Secondary | ICD-10-CM | POA: Diagnosis present

## 2013-02-09 DIAGNOSIS — E785 Hyperlipidemia, unspecified: Secondary | ICD-10-CM | POA: Diagnosis present

## 2013-02-09 DIAGNOSIS — M109 Gout, unspecified: Secondary | ICD-10-CM | POA: Diagnosis present

## 2013-02-09 DIAGNOSIS — J4489 Other specified chronic obstructive pulmonary disease: Secondary | ICD-10-CM | POA: Diagnosis present

## 2013-02-09 DIAGNOSIS — K56609 Unspecified intestinal obstruction, unspecified as to partial versus complete obstruction: Principal | ICD-10-CM | POA: Diagnosis present

## 2013-02-09 DIAGNOSIS — J449 Chronic obstructive pulmonary disease, unspecified: Secondary | ICD-10-CM | POA: Diagnosis present

## 2013-02-09 DIAGNOSIS — I2510 Atherosclerotic heart disease of native coronary artery without angina pectoris: Secondary | ICD-10-CM | POA: Diagnosis present

## 2013-02-09 DIAGNOSIS — I252 Old myocardial infarction: Secondary | ICD-10-CM

## 2013-02-09 DIAGNOSIS — I10 Essential (primary) hypertension: Secondary | ICD-10-CM | POA: Diagnosis present

## 2013-02-09 DIAGNOSIS — N183 Chronic kidney disease, stage 3 unspecified: Secondary | ICD-10-CM | POA: Diagnosis present

## 2013-02-09 DIAGNOSIS — Z79899 Other long term (current) drug therapy: Secondary | ICD-10-CM

## 2013-02-09 DIAGNOSIS — I2589 Other forms of chronic ischemic heart disease: Secondary | ICD-10-CM | POA: Diagnosis present

## 2013-02-09 DIAGNOSIS — Z951 Presence of aortocoronary bypass graft: Secondary | ICD-10-CM

## 2013-02-09 DIAGNOSIS — R109 Unspecified abdominal pain: Secondary | ICD-10-CM | POA: Diagnosis present

## 2013-02-09 DIAGNOSIS — I129 Hypertensive chronic kidney disease with stage 1 through stage 4 chronic kidney disease, or unspecified chronic kidney disease: Secondary | ICD-10-CM | POA: Diagnosis present

## 2013-02-09 LAB — CG4 I-STAT (LACTIC ACID): Lactic Acid, Venous: 1.05 mmol/L (ref 0.5–2.2)

## 2013-02-09 MED ORDER — ONDANSETRON HCL 4 MG/2ML IJ SOLN
4.0000 mg | Freq: Once | INTRAMUSCULAR | Status: AC
  Administered 2013-02-09: 4 mg via INTRAVENOUS
  Filled 2013-02-09: qty 2

## 2013-02-09 MED ORDER — MORPHINE SULFATE 4 MG/ML IJ SOLN
4.0000 mg | Freq: Once | INTRAMUSCULAR | Status: AC
  Administered 2013-02-09: 4 mg via INTRAVENOUS
  Filled 2013-02-09: qty 1

## 2013-02-09 NOTE — ED Notes (Signed)
According to EMS, patient began to have nausea.  He never did vomit however he started feeling faint, diaphoretic and called EMS.  EMS placed a 20gauge and gave Zofran IV.

## 2013-02-09 NOTE — ED Notes (Signed)
Pt presents with lower abdominal tenderness onset today- worse with palpation to LLQ, nausea associated with symptoms.  Denies vomiting or diarrhea.  Last movement was this morning.  Denies any changes in urinary symptoms.

## 2013-02-09 NOTE — ED Provider Notes (Addendum)
CSN: [id number]     Arrival date & time 02/09/13  2212 History   First MD Initiated Contact with Patient 02/09/13 2303     Chief Complaint  Patient presents with  . Abdominal Pain    N, lower, left quadrant pain, sharp 6/10 positive orthostatic blood pressures   (Consider location/radiation/quality/duration/timing/severity/associated sxs/prior Treatment) HPI 79 year old male presents to the emergency apartment with complaint of diffuse abdominal pain throughout the day, now more left lower quadrant, associated with nausea.  No fevers no chills.  Normal bowel movements.  No vomiting.  No sick contacts, no unusual foods, no travel.  Patient has significant past medical history of coronary disease, status post CABG x2, carotid artery occlusion, AAA, peripheral vascular disease, reports colon resection due to a bowel blockage.  Past medical history list Crohn disease.  He reports a normal colonoscopy with the last 5 years. Past Medical History  Diagnosis Date  . Hyperlipidemia   . Hypertension   . Carotid artery occlusion     s/p L CEA in 2010  . Crohn's disease     colon resection  . Bradycardia     previous intolerance to beta blockers - low dose toprol started 10/2011 for tachycardia but had rash and was discontinued  . Chronic systolic heart failure     echo 7/12: EF 35-40%, anteroseptal hypokinesis, mild MR.  . Ischemic cardiomyopathy     EF 34% (Lexiscan 06/2011), 35-40% (echo 11/2010)  . Coronary artery disease     CABG 1987, redo 2008; Last LHC 1/10: SVG-OM 1/OM 2 patent, SVG-LAD patent, SVG-OM 3 Patent  . Peripheral vascular disease     Carotid dz s/p L CEA, RAS, AAA, LE dz  . Renal artery stenosis     Dopplers 12/2010 - 1-59% R ostial renal artery stenosis, 2 L renal arteries both widely patent  . Gouty arthritis   . AAA (abdominal aortic aneurysm)     Ectatic abdominal aorta with fusiform dilitation 3.4x3.4 and moderate thrombus burden 12/2010  . RBBB   . Tachycardia     a.  10/2011 - atrial tach vs avnrt - BB started.  . Myocardial infarction   . COPD (chronic obstructive pulmonary disease)    Past Surgical History  Procedure Laterality Date  . Carotid endarterectomy  2010    left  . Colon resection    . Tonsillectomy      as a child  . Pr vein bypass graft,aorto-fem-pop  10/1985  . Vein bypass surgery      LEFT   06/2011  . Endarterectomy  08/26/2011    Procedure: ENDARTERECTOMY ILIAC;  Surgeon: [name], MD;  Location: [facility] OR;  Service: Vascular;  Laterality: Right;  Right Femoral Artery Endarterectomy with vascu guard patch angioplasty & intraoperative arteriogram.  . Coronary artery bypass graft  10/31/85 & 08/19/06  . Cardiac catheterization  07/28/2012    Native 3v CAD, continued patency of the SVG-OM1-LAD and SVG-OM2-left PDA. LVEF 40% with multiple WMAs   Family History  Problem Relation Age of Onset  . Heart disease Father   . Cancer Mother   . COPD Mother   . Anesthesia problems Neg Hx   . Deep vein thrombosis Brother   . COPD Brother   . CAD Brother    History  Substance Use Topics  . Smoking status: Never Smoker   . Smokeless tobacco: Never Used  . Alcohol Use: No    Review of Systems  See History of Present Illness; otherwise all  other systems are reviewed and negative  Allergies  Novocain and Metoprolol  Home Medications   Current Outpatient Rx  Name  Route  Sig  Dispense  Refill  . acetaminophen (TYLENOL) 500 MG tablet   Oral   Take 1,000 mg by mouth every 6 (six) hours as needed for pain.         [name] amLODipine (NORVASC) 5 MG tablet   Oral   Take 5 mg by mouth daily.         [name] aspirin 81 MG tablet   Oral   Take 81 mg by mouth daily.          [name] atorvastatin (LIPITOR) 10 MG tablet   Oral   Take 10 mg by mouth at bedtime.          . clopidogrel (PLAVIX) 75 MG tablet   Oral   Take 75 mg by mouth daily.         [name] COLCRYS 0.6 MG tablet   Oral   Take 0.6 mg by mouth daily as needed (gout).          .  fexofenadine (ALLEGRA) 180 MG tablet   Oral   Take 180 mg by mouth daily as needed (seasonal allergies).          [name] ipratropium (ATROVENT) 0.03 % nasal spray   Nasal   Place 1 spray into the nose daily.          . isosorbide mononitrate (IMDUR) 60 MG 24 hr tablet   Oral   Take 1 tablet (60 mg total) by mouth 2 (two) times daily.   180 tablet   3   . losartan (COZAAR) 100 MG tablet   Oral   Take 1 tablet (100 mg total) by mouth daily.   90 tablet   3   . Multiple Vitamins-Minerals (MULTIVITAMIN PO)   Oral   Take 1 tablet by mouth daily.         . nitroGLYCERIN (NITROSTAT) 0.4 MG SL tablet   Sublingual   Place 0.4 mg under the tongue every 5 (five) minutes as needed for chest pain.         . pantoprazole (PROTONIX) 40 MG tablet   Oral   Take 1 tablet (40 mg total) by mouth daily.   90 tablet   3   . propranolol (INDERAL) 10 MG tablet      Take 1 tablet as needed for heart rate >110         . ranolazine (RANEXA) 500 MG 12 hr tablet   Oral   Take 1 tablet (500 mg total) by mouth 2 (two) times daily.   180 tablet   3   . vitamin B-12 (CYANOCOBALAMIN) 100 MCG tablet   Oral   Take 50 mcg by mouth daily.          BP 138/69  Pulse 69  Temp(Src) 97.8 F (36.6 C) (Oral)  Resp 18  Ht 5\' 6"  (1.676 m)  Wt 135 lb (61.236 kg)  BMI 21.8 kg/m2  SpO2 98% Physical Exam  Nursing note and vitals reviewed. Constitutional: He is oriented to person, place, and time. He appears well-developed and well-nourished.  HENT:  Head: Normocephalic and atraumatic.  Nose: Nose normal.  Mouth/Throat: Oropharynx is clear and moist.  Eyes: Conjunctivae and EOM are normal. Pupils are equal, round, and reactive to light.  Neck: Normal range of motion. Neck supple. No JVD present. No tracheal deviation present. No thyromegaly present.  Cardiovascular: Normal rate, regular rhythm, normal heart sounds and intact distal pulses.  Exam reveals no gallop and no friction rub.   No  murmur heard. Pulmonary/Chest: Effort normal and breath sounds normal. No stridor. No respiratory distress. He has no wheezes. He has no rales. He exhibits no tenderness.  Abdominal: Soft. He exhibits no distension and no mass. There is tenderness (diffuse tenderness worse in the left lower quadrant). There is no rebound and no guarding.  Decreased bowel sounds  Musculoskeletal: Normal range of motion. He exhibits no edema and no tenderness.  Lymphadenopathy:    He has no cervical adenopathy.  Neurological: He is alert and oriented to person, place, and time. He exhibits normal muscle tone. Coordination normal.  Skin: Skin is warm and dry. No rash noted. No erythema. No pallor.  Psychiatric: He has a normal mood and affect. His behavior is normal. Judgment and thought content normal.    ED Course  NG placement Date/Time: 02/10/2013 4:55 AM Performed by: Olivia Mackie Authorized by: Olivia Mackie Consent: Verbal consent obtained. Local anesthesia used: yes Local anesthetic: topical anesthetic Patient sedated: no Comments: Unable to pass NGT after several tries.  Some bleeding after attempts.  Afrin and pressure applied   (including critical care time) Labs Review Labs Reviewed  CBC WITH DIFFERENTIAL - Abnormal; Notable for the following:    WBC 10.8 (*)    RBC 4.02 (*)    HCT 38.7 (*)    Platelets 147 (*)    Neutrophils Relative % 85 (*)    Neutro Abs 9.1 (*)    Lymphocytes Relative 8 (*)    All other components within normal limits  COMPREHENSIVE METABOLIC PANEL - Abnormal; Notable for the following:    Glucose, Bld 118 (*)    GFR calc non Af Amer 50 (*)    GFR calc Af Amer 58 (*)    All other components within normal limits  URINALYSIS, ROUTINE W REFLEX MICROSCOPIC - Abnormal; Notable for the following:    APPearance HAZY (*)    All other components within normal limits  LIPASE, BLOOD  CG4 I-STAT (LACTIC ACID)   Imaging Review Ct Abdomen Pelvis W Contrast  02/10/2013    CLINICAL DATA:  Lower abdominal pain, nausea, vomiting.  EXAM: CT ABDOMEN AND PELVIS WITH CONTRAST  TECHNIQUE: Multidetector CT imaging of the abdomen and pelvis was performed using the standard protocol following bolus administration of intravenous contrast.  CONTRAST:  80mL OMNIPAQUE IOHEXOL 300 MG/ML  SOLN  COMPARISON:  08/25/2006  FINDINGS: Minimal bibasilar atelectasis. Heart is borderline in size. No effusions.  Small gallstone layering within the gallbladder. Liver, spleen, pancreas, adrenals are unremarkable. Small cysts in the right kidney. Left kidney unremarkable. No hydronephrosis.  3.0 cm infrarenal abdominal aortic aneurysm with extensive mural plaque.  Postsurgical changes in the right lower quadrant. There are dilated small bowel loops with air-fluid levels. Findings compatible with small bowel obstruction. Small bowel appears to be dilated to the neo terminal ileum with postsurgical changes in the right lower quadrant. Cannot exclude an anastomotic stricture. Large bowel unremarkable.  No free fluid, free air or adenopathy. Urinary bladder is unremarkable.  IMPRESSION: Dilated small bowel to the level of the neo terminal ileum. There is fecalization noted in the distal small bowel loops. Findings concerning for small bowel obstruction, possibly related to stricture at the neo terminal ileum anastomosis.  3 cm infrarenal abdominal aortic aneurysm.  Cholelithiasis.   Electronically Signed   By: Charlett Nose  M.D.   On: 02/10/2013 03:03    MDM   1. SBO (small bowel obstruction)     79 year old male with lower abdominal pain.  Differential includes diverticulitis, small bowel obstruction, kidney stone, pyelonephritis, intestinal ischemia, vasculitis.  We'll check labs, lactate, CT abdomen pelvis.  Patient and family updated on plan.  Pain and nausea medicines written for.    Olivia Mackie, MD 02/10/13  Olivia Mackie, MD 02/10/13 [phone]

## 2013-02-10 ENCOUNTER — Inpatient Hospital Stay (HOSPITAL_COMMUNITY): Payer: Medicare PPO

## 2013-02-10 ENCOUNTER — Emergency Department (HOSPITAL_COMMUNITY): Payer: Medicare PPO

## 2013-02-10 ENCOUNTER — Encounter (HOSPITAL_COMMUNITY): Payer: Self-pay | Admitting: Radiology

## 2013-02-10 DIAGNOSIS — K56609 Unspecified intestinal obstruction, unspecified as to partial versus complete obstruction: Secondary | ICD-10-CM

## 2013-02-10 LAB — CBC WITH DIFFERENTIAL/PLATELET
Hemoglobin: 13.3 g/dL (ref 13.0–17.0)
Lymphocytes Relative: 8 % — ABNORMAL LOW (ref 12–46)
Lymphs Abs: 0.9 10*3/uL (ref 0.7–4.0)
MCH: 33.1 pg (ref 26.0–34.0)
Monocytes Relative: 7 % (ref 3–12)
Neutro Abs: 9.1 10*3/uL — ABNORMAL HIGH (ref 1.7–7.7)
Neutrophils Relative %: 85 % — ABNORMAL HIGH (ref 43–77)
Platelets: 147 10*3/uL — ABNORMAL LOW (ref 150–400)
RBC: 4.02 MIL/uL — ABNORMAL LOW (ref 4.22–5.81)
WBC: 10.8 10*3/uL — ABNORMAL HIGH (ref 4.0–10.5)

## 2013-02-10 LAB — COMPREHENSIVE METABOLIC PANEL
ALT: 17 U/L (ref 0–53)
Alkaline Phosphatase: 86 U/L (ref 39–117)
BUN: 22 mg/dL (ref 6–23)
CO2: 27 mEq/L (ref 19–32)
Chloride: 101 mEq/L (ref 96–112)
GFR calc Af Amer: 58 mL/min — ABNORMAL LOW (ref >90)
Glucose, Bld: 118 mg/dL — ABNORMAL HIGH (ref 70–99)
Potassium: 4.2 mEq/L (ref 3.5–5.1)
Sodium: 140 mEq/L (ref 135–145)
Total Bilirubin: 0.5 mg/dL (ref 0.3–1.2)

## 2013-02-10 LAB — URINALYSIS, ROUTINE W REFLEX MICROSCOPIC
Bilirubin Urine: NEGATIVE
Hgb urine dipstick: NEGATIVE
Ketones, ur: NEGATIVE mg/dL
Nitrite: NEGATIVE
Specific Gravity, Urine: 1.016 (ref 1.005–1.030)
Urobilinogen, UA: 0.2 mg/dL (ref 0.0–1.0)
pH: 5.5 (ref 5.0–8.0)

## 2013-02-10 LAB — GLUCOSE, CAPILLARY

## 2013-02-10 LAB — LIPASE, BLOOD: Lipase: 11 U/L (ref 11–59)

## 2013-02-10 MED ORDER — PANTOPRAZOLE SODIUM 40 MG IV SOLR
40.0000 mg | INTRAVENOUS | Status: DC
  Administered 2013-02-10 – 2013-02-13 (×4): 40 mg via INTRAVENOUS
  Filled 2013-02-10 (×6): qty 40

## 2013-02-10 MED ORDER — ASPIRIN 81 MG PO TABS: 81.0000 mg | ORAL_TABLET | Freq: Every day | ORAL | Status: DC

## 2013-02-10 MED ORDER — IOHEXOL 300 MG/ML  SOLN
25.0000 mL | INTRAMUSCULAR | Status: AC
  Administered 2013-02-10: 25 mL via ORAL

## 2013-02-10 MED ORDER — ONDANSETRON HCL 4 MG/2ML IJ SOLN
4.0000 mg | Freq: Once | INTRAMUSCULAR | Status: AC
  Administered 2013-02-10: 4 mg via INTRAVENOUS
  Filled 2013-02-10: qty 2

## 2013-02-10 MED ORDER — ACETAMINOPHEN 325 MG PO TABS
650.0000 mg | ORAL_TABLET | Freq: Four times a day (QID) | ORAL | Status: DC | PRN
  Filled 2013-02-10: qty 2

## 2013-02-10 MED ORDER — RANOLAZINE ER 500 MG PO TB12: 500.0000 mg | ORAL_TABLET | Freq: Two times a day (BID) | ORAL | Status: DC

## 2013-02-10 MED ORDER — ENOXAPARIN SODIUM 40 MG/0.4ML ~~LOC~~ SOLN
40.0000 mg | SUBCUTANEOUS | Status: DC
  Administered 2013-02-10 – 2013-02-14 (×5): 40 mg via SUBCUTANEOUS
  Filled 2013-02-10 (×6): qty 0.4

## 2013-02-10 MED ORDER — ONDANSETRON HCL 4 MG/2ML IJ SOLN: 4.0000 mg | Freq: Three times a day (TID) | INTRAMUSCULAR | Status: AC | PRN

## 2013-02-10 MED ORDER — KCL IN DEXTROSE-NACL 20-5-0.9 MEQ/L-%-% IV SOLN
INTRAVENOUS | Status: AC
  Administered 2013-02-10 – 2013-02-11 (×2): via INTRAVENOUS
  Administered 2013-02-11: 75 mL/h via INTRAVENOUS
  Administered 2013-02-12 – 2013-02-13 (×2): via INTRAVENOUS
  Filled 2013-02-10 (×7): qty 1000

## 2013-02-10 MED ORDER — OXYMETAZOLINE HCL 0.05 % NA SOLN
1.0000 | Freq: Two times a day (BID) | NASAL | Status: AC
  Administered 2013-02-10 – 2013-02-12 (×4): 1 via NASAL
  Filled 2013-02-10: qty 15

## 2013-02-10 MED ORDER — MORPHINE SULFATE 4 MG/ML IJ SOLN
4.0000 mg | Freq: Once | INTRAMUSCULAR | Status: AC
  Administered 2013-02-10: 4 mg via INTRAVENOUS
  Filled 2013-02-10: qty 1

## 2013-02-10 MED ORDER — ACETAMINOPHEN 650 MG RE SUPP
650.0000 mg | Freq: Four times a day (QID) | RECTAL | Status: DC | PRN
  Administered 2013-02-13: 650 mg via RECTAL
  Filled 2013-02-10: qty 1

## 2013-02-10 MED ORDER — NITROGLYCERIN 0.3 MG/HR TD PT24
0.3000 mg | MEDICATED_PATCH | Freq: Every day | TRANSDERMAL | Status: DC
  Administered 2013-02-10 – 2013-02-14 (×5): 0.3 mg via TRANSDERMAL
  Filled 2013-02-10 (×5): qty 1

## 2013-02-10 MED ORDER — LOSARTAN POTASSIUM 50 MG PO TABS: 100.0000 mg | ORAL_TABLET | Freq: Every day | ORAL | Status: DC

## 2013-02-10 MED ORDER — HYDROMORPHONE HCL PF 1 MG/ML IJ SOLN
0.5000 mg | INTRAMUSCULAR | Status: DC | PRN
  Administered 2013-02-13 (×2): 0.5 mg via INTRAVENOUS
  Filled 2013-02-10 (×2): qty 1

## 2013-02-10 MED ORDER — HEPARIN SODIUM (PORCINE) 5000 UNIT/ML IJ SOLN: 5000.0000 [IU] | Freq: Three times a day (TID) | INTRAMUSCULAR | Status: DC

## 2013-02-10 MED ORDER — IOHEXOL 300 MG/ML  SOLN
80.0000 mL | Freq: Once | INTRAMUSCULAR | Status: AC | PRN
  Administered 2013-02-10: 80 mL via INTRAVENOUS

## 2013-02-10 MED ORDER — CLOPIDOGREL BISULFATE 75 MG PO TABS: 75.0000 mg | ORAL_TABLET | Freq: Every day | ORAL | Status: DC

## 2013-02-10 MED ORDER — SODIUM CHLORIDE 0.9 % IV SOLN: INTRAVENOUS | Status: DC

## 2013-02-10 MED ORDER — ATORVASTATIN CALCIUM 10 MG PO TABS: 10.0000 mg | ORAL_TABLET | Freq: Every day | ORAL | Status: DC

## 2013-02-10 MED ORDER — ISOSORBIDE MONONITRATE ER 60 MG PO TB24: 60.0000 mg | ORAL_TABLET | Freq: Two times a day (BID) | ORAL | Status: DC

## 2013-02-10 MED ORDER — AMLODIPINE BESYLATE 5 MG PO TABS: 5.0000 mg | ORAL_TABLET | Freq: Every day | ORAL | Status: DC

## 2013-02-10 MED ORDER — PANTOPRAZOLE SODIUM 40 MG PO TBEC: 40.0000 mg | DELAYED_RELEASE_TABLET | Freq: Every day | ORAL | Status: DC

## 2013-02-10 NOTE — Progress Notes (Signed)
Pt returns from radiology with new NG tube.  Per verbal order from Dr. Eloise Harman, NG tube set to low intermittent suction.  Pt without complaints talking on phone.  Will con't plan of care.

## 2013-02-10 NOTE — Progress Notes (Signed)
Dr. Felipa Eth notified of pt arrival.  Dr. Jarold Motto to see pt shortly.

## 2013-02-10 NOTE — ED Notes (Signed)
Second attempt made by Reola Mosher RN to pass NG tube without success

## 2013-02-10 NOTE — ED Notes (Signed)
Pt educated on the need to finish drinking his CT contrast if possible.  Pt currently has finished roughly 2/3rds of his container

## 2013-02-10 NOTE — Progress Notes (Signed)
  Subjective: Unable to have ng passed, nauseated  Objective: Vital signs in last 24 hours: Temp:  [97.5 F (36.4 C)-97.8 F (36.6 C)] 97.5 F (36.4 C) (10/03 0513) Pulse Rate:  [58-111] 111 (10/03 0513) Resp:  [12-18] 18 (10/03 0513) BP: (99-165)/(62-138) 150/93 mmHg (10/03 0513) SpO2:  [96 %-100 %] 96 % (10/03 0513) Weight:  [133 lb 8 oz (60.555 kg)-135 lb (61.236 kg)] 133 lb 8 oz (60.555 kg) (10/03 0513) Last BM Date: 02/09/13  Intake/Output from previous day:   Intake/Output this shift:    General appearance: no distress GI: moderate distention, few bs, nontender, incision present  Lab Results:   Recent Labs  02/09/13 2330  WBC 10.8*  HGB 13.3  HCT 38.7*  PLT 147*   BMET  Recent Labs  02/09/13 2330  NA 140  K 4.2  CL 101  CO2 27  GLUCOSE 118*  BUN 22  CREATININE 1.28  CALCIUM 9.8   PT/INR No results found for this basename: LABPROT, INR,  in the last 72 hours ABG No results found for this basename: PHART, PCO2, PO2, HCO3,  in the last 72 hours  Studies/Results: Ct Abdomen Pelvis W Contrast  02/10/2013   CLINICAL DATA:  Lower abdominal pain, nausea, vomiting.  EXAM: CT ABDOMEN AND PELVIS WITH CONTRAST  TECHNIQUE: Multidetector CT imaging of the abdomen and pelvis was performed using the standard protocol following bolus administration of intravenous contrast.  CONTRAST:  80mL OMNIPAQUE IOHEXOL 300 MG/ML  SOLN  COMPARISON:  08/25/2006  FINDINGS: Minimal bibasilar atelectasis. Heart is borderline in size. No effusions.  Small gallstone layering within the gallbladder. Liver, spleen, pancreas, adrenals are unremarkable. Small cysts in the right kidney. Left kidney unremarkable. No hydronephrosis.  3.0 cm infrarenal abdominal aortic aneurysm with extensive mural plaque.  Postsurgical changes in the right lower quadrant. There are dilated small bowel loops with air-fluid levels. Findings compatible with small bowel obstruction. Small bowel appears to be dilated  to the neo terminal ileum with postsurgical changes in the right lower quadrant. Cannot exclude an anastomotic stricture. Large bowel unremarkable.  No free fluid, free air or adenopathy. Urinary bladder is unremarkable.  IMPRESSION: Dilated small bowel to the level of the neo terminal ileum. There is fecalization noted in the distal small bowel loops. Findings concerning for small bowel obstruction, possibly related to stricture at the neo terminal ileum anastomosis.  3 cm infrarenal abdominal aortic aneurysm.  Cholelithiasis.   Electronically Signed   By: Charlett Nose M.D.   On: 02/10/2013 03:03    Anti-infectives: Anti-infectives   None      Assessment/Plan: sbo likely anastomotic  Agree with dr Ronnell Freshwater note from earlier, i wrote for fluoro to place ng tube and discussed with them.  [facility] 02/10/2013

## 2013-02-10 NOTE — Consult Note (Signed)
Reason for Consult:Small bowel obstruction Referring Physician: [name] is an 79 y.o. male.  HPI: I was asked to see Karl Stevenson by Dr. Jarold Stevenson regarding small bowel obstruction. He is status post an ileocecectomy for Crohn's disease many years ago by Dr. Zachery Stevenson. He has been doing well. He developed abdominal pain yesterday. He denies nausea or vomiting. He has been having regular bowel movements daily. He came to the emergency department for further evaluation. CT scan abdomen and pelvis  Was dilation of the distal ileum with some fistulization of distal small bowel loops consistent with small bowel obstruction. At this time his pain is improved.  Past Medical History  Diagnosis Date  . Hyperlipidemia   . Hypertension   . Carotid artery occlusion     s/p L CEA in 2010  . Crohn's disease     colon resection  . Bradycardia     previous intolerance to beta blockers - low dose toprol started 10/2011 for tachycardia but had rash and was discontinued  . Chronic systolic heart failure     echo 7/12: EF 35-40%, anteroseptal hypokinesis, mild MR.  . Ischemic cardiomyopathy     EF 34% (Lexiscan 06/2011), 35-40% (echo 11/2010)  . Coronary artery disease     CABG 1987, redo 2008; Last LHC 1/10: SVG-OM 1/OM 2 patent, SVG-LAD patent, SVG-OM 3 Patent  . Peripheral vascular disease     Carotid dz s/p L CEA, RAS, AAA, LE dz  . Renal artery stenosis     Dopplers 12/2010 - 1-59% R ostial renal artery stenosis, 2 L renal arteries both widely patent  . Gouty arthritis   . AAA (abdominal aortic aneurysm)     Ectatic abdominal aorta with fusiform dilitation 3.4x3.4 and moderate thrombus burden 12/2010  . RBBB   . Tachycardia     a. 10/2011 - atrial tach vs avnrt - BB started.  . Myocardial infarction   . COPD (chronic obstructive pulmonary disease)     Past Surgical History  Procedure Laterality Date  . Carotid endarterectomy  2010    left  . Colon resection    .  Tonsillectomy      as a child  . Pr vein bypass graft,aorto-fem-pop  10/1985  . Vein bypass surgery      LEFT   06/2011  . Endarterectomy  08/26/2011    Procedure: ENDARTERECTOMY ILIAC;  Surgeon: Karl Hertz, MD;  Location: [facility] OR;  Service: Vascular;  Laterality: Right;  Right Femoral Artery Endarterectomy with vascu guard patch angioplasty & intraoperative arteriogram.  . Coronary artery bypass graft  10/31/85 & 08/19/06  . Cardiac catheterization  07/28/2012    Native 3v CAD, continued patency of the SVG-OM1-LAD and SVG-OM2-left PDA. LVEF 40% with multiple WMAs    Family History  Problem Relation Age of Onset  . Heart disease Father   . Cancer Mother   . COPD Mother   . Anesthesia problems Neg Hx   . Deep vein thrombosis Brother   . COPD Brother   . CAD Brother     Social History:  reports that he has never smoked. He has never used smokeless tobacco. He reports that he does not drink alcohol or use illicit drugs.  Allergies:  Allergies  Allergen Reactions  . Novocain [Procaine Hcl] Other (See Comments)    Cold sweats and very bad headache.   . Metoprolol Hives, Itching and Rash    Medications:  Prior to Admission:  Prescriptions prior to  admission  Medication Sig Dispense Refill  . acetaminophen (TYLENOL) 500 MG tablet Take 1,000 mg by mouth every 6 (six) hours as needed for pain.      [name] amLODipine (NORVASC) 5 MG tablet Take 5 mg by mouth daily.      [name] aspirin 81 MG tablet Take 81 mg by mouth daily.       [name] atorvastatin (LIPITOR) 10 MG tablet Take 10 mg by mouth at bedtime.       . clopidogrel (PLAVIX) 75 MG tablet Take 75 mg by mouth daily.      [name] COLCRYS 0.6 MG tablet Take 0.6 mg by mouth daily as needed (gout).       . fexofenadine (ALLEGRA) 180 MG tablet Take 180 mg by mouth daily as needed (seasonal allergies).       [name] ipratropium (ATROVENT) 0.03 % nasal spray Place 1 spray into the nose daily.       . isosorbide mononitrate (IMDUR) 60 MG 24 hr tablet Take 1 tablet (60  mg total) by mouth 2 (two) times daily.  180 tablet  3  . losartan (COZAAR) 100 MG tablet Take 1 tablet (100 mg total) by mouth daily.  90 tablet  3  . Multiple Vitamins-Minerals (MULTIVITAMIN PO) Take 1 tablet by mouth daily.      . nitroGLYCERIN (NITROSTAT) 0.4 MG SL tablet Place 0.4 mg under the tongue every 5 (five) minutes as needed for chest pain.      . pantoprazole (PROTONIX) 40 MG tablet Take 1 tablet (40 mg total) by mouth daily.  90 tablet  3  . propranolol (INDERAL) 10 MG tablet Take 1 tablet as needed for heart rate >110      . ranolazine (RANEXA) 500 MG 12 hr tablet Take 1 tablet (500 mg total) by mouth 2 (two) times daily.  180 tablet  3  . vitamin B-12 (CYANOCOBALAMIN) 100 MCG tablet Take 50 mcg by mouth daily.        Results for orders placed during the hospital encounter of 02/09/13 (from the past 48 hour(s))  CBC WITH DIFFERENTIAL     Status: Abnormal   Collection Time    02/09/13 11:30 PM      Result Value Range   WBC 10.8 (*) 4.0 - 10.5 K/uL   RBC 4.02 (*) 4.22 - 5.81 MIL/uL   Hemoglobin 13.3  13.0 - 17.0 g/dL   HCT 13.0 (*) 86.5 - 78.4 %   MCV 96.3  78.0 - 100.0 fL   MCH 33.1  26.0 - 34.0 pg   MCHC 34.4  30.0 - 36.0 g/dL   RDW 69.6  29.5 - 28.4 %   Platelets 147 (*) 150 - 400 K/uL   Neutrophils Relative % 85 (*) 43 - 77 %   Neutro Abs 9.1 (*) 1.7 - 7.7 K/uL   Lymphocytes Relative 8 (*) 12 - 46 %   Lymphs Abs 0.9  0.7 - 4.0 K/uL   Monocytes Relative 7  3 - 12 %   Monocytes Absolute 0.8  0.1 - 1.0 K/uL   Eosinophils Relative 0  0 - 5 %   Eosinophils Absolute 0.0  0.0 - 0.7 K/uL   Basophils Relative 0  0 - 1 %   Basophils Absolute 0.0  0.0 - 0.1 K/uL  COMPREHENSIVE METABOLIC PANEL     Status: Abnormal   Collection Time    02/09/13 11:30 PM      Result Value Range   Sodium 140  135 - 145 mEq/L   Potassium 4.2  3.5 - 5.1 mEq/L   Chloride 101  96 - 112 mEq/L   CO2 27  19 - 32 mEq/L   Glucose, Bld 118 (*) 70 - 99 mg/dL   BUN 22  6 - 23 mg/dL   Creatinine,  Ser 1.61  0.50 - 1.35 mg/dL   Calcium 9.8  8.4 - 09.6 mg/dL   Total Protein 7.3  6.0 - 8.3 g/dL   Albumin 4.1  3.5 - 5.2 g/dL   AST 21  0 - 37 U/L   ALT 17  0 - 53 U/L   Alkaline Phosphatase 86  39 - 117 U/L   Total Bilirubin 0.5  0.3 - 1.2 mg/dL   GFR calc non Af Amer 50 (*) >90 mL/min   GFR calc Af Amer 58 (*) >90 mL/min   Comment: (NOTE)     The eGFR has been calculated using the CKD EPI equation.     This calculation has not been validated in all clinical situations.     eGFR's persistently <90 mL/min signify possible Chronic Kidney     Disease.  LIPASE, BLOOD     Status: None   Collection Time    02/09/13 11:30 PM      Result Value Range   Lipase 11  11 - 59 U/L  CG4 I-STAT (LACTIC ACID)     Status: None   Collection Time    02/09/13 11:57 PM      Result Value Range   Lactic Acid, Venous 1.05  0.5 - 2.2 mmol/L  URINALYSIS, ROUTINE W REFLEX MICROSCOPIC     Status: Abnormal   Collection Time    02/10/13 12:59 AM      Result Value Range   Color, Urine YELLOW  YELLOW   APPearance HAZY (*) CLEAR   Specific Gravity, Urine 1.016  1.005 - 1.030   pH 5.5  5.0 - 8.0   Glucose, UA NEGATIVE  NEGATIVE mg/dL   Hgb urine dipstick NEGATIVE  NEGATIVE   Bilirubin Urine NEGATIVE  NEGATIVE   Ketones, ur NEGATIVE  NEGATIVE mg/dL   Protein, ur NEGATIVE  NEGATIVE mg/dL   Urobilinogen, UA 0.2  0.0 - 1.0 mg/dL   Nitrite NEGATIVE  NEGATIVE   Leukocytes, UA NEGATIVE  NEGATIVE   Comment: MICROSCOPIC NOT DONE ON URINES WITH NEGATIVE PROTEIN, BLOOD, LEUKOCYTES, NITRITE, OR GLUCOSE <1000 mg/dL.    Ct Abdomen Pelvis W Contrast  02/10/2013   CLINICAL DATA:  Lower abdominal pain, nausea, vomiting.  EXAM: CT ABDOMEN AND PELVIS WITH CONTRAST  TECHNIQUE: Multidetector CT imaging of the abdomen and pelvis was performed using the standard protocol following bolus administration of intravenous contrast.  CONTRAST:  80mL OMNIPAQUE IOHEXOL 300 MG/ML  SOLN  COMPARISON:  08/25/2006  FINDINGS: Minimal bibasilar  atelectasis. Heart is borderline in size. No effusions.  Small gallstone layering within the gallbladder. Liver, spleen, pancreas, adrenals are unremarkable. Small cysts in the right kidney. Left kidney unremarkable. No hydronephrosis.  3.0 cm infrarenal abdominal aortic aneurysm with extensive mural plaque.  Postsurgical changes in the right lower quadrant. There are dilated small bowel loops with air-fluid levels. Findings compatible with small bowel obstruction. Small bowel appears to be dilated to the neo terminal ileum with postsurgical changes in the right lower quadrant. Cannot exclude an anastomotic stricture. Large bowel unremarkable.  No free fluid, free air or adenopathy. Urinary bladder is unremarkable.  IMPRESSION: Dilated small bowel to the level of the neo terminal  ileum. There is fecalization noted in the distal small bowel loops. Findings concerning for small bowel obstruction, possibly related to stricture at the neo terminal ileum anastomosis.  3 cm infrarenal abdominal aortic aneurysm.  Cholelithiasis.   Electronically Signed   By: Charlett Nose M.D.   On: 02/10/2013 03:03    Review of Systems  Constitutional: Negative for fever and chills.  HENT: Negative.   Eyes: Negative.   Respiratory: Negative.   Cardiovascular: Negative for chest pain and palpitations.  Gastrointestinal: Positive for abdominal pain. Negative for nausea, vomiting, diarrhea and constipation.  Genitourinary: Negative.   Musculoskeletal: Negative.   Skin: Negative.   Neurological: Negative.   Endo/Heme/Allergies: Negative.   Psychiatric/Behavioral: Negative.    Blood pressure 150/93, pulse 111, temperature 97.5 F (36.4 C), temperature source Oral, resp. rate 18, height 5\' 7"  (1.702 m), weight 60.555 kg (133 lb 8 oz), SpO2 96.00%. Physical Exam  Constitutional: He is oriented to person, place, and time. He appears well-developed and well-nourished.  HENT:  Head: Normocephalic and atraumatic.  Mouth/Throat:  No oropharyngeal exudate.  Eyes: EOM are normal. Pupils are equal, round, and reactive to light.  Neck: Normal range of motion. Neck supple. No tracheal deviation present.  Cardiovascular: Normal rate, regular rhythm and normal heart sounds.   Respiratory: Effort normal and breath sounds normal. No stridor. No respiratory distress. He has no wheezes. He has no rales.  GI: Soft. He exhibits distension. There is no tenderness. There is no rebound and no guarding.  Lower midline scar, mild distention, no tenderness, active bowel sounds  Musculoskeletal: Normal range of motion. He exhibits no tenderness.  Neurological: He is alert and oriented to person, place, and time. He exhibits normal muscle tone.  Skin: Skin is warm and dry.  Psychiatric: He has a normal mood and affect. His behavior is normal.    Assessment/Plan: Small bowel obstruction. Recommend bowel rest, IV fluids, and close followup. If patient develops nausea and vomiting will place nasogastric tube. We will follow closely with you. If this does not resolve he may require surgical intervention. Karl Stevenson E 02/10/2013, 6:21 AM

## 2013-02-10 NOTE — H&P (Signed)
Karl Stevenson is an 79 y.o. male.   Chief Complaint: abdominal pain HPI:  Patient is an 79 year old Caucasian man who is well-known to me as I service his primary care physician. He was in his usual state of good health until late yesterday morning when he started to develop mild lower abdominal pain. He had had a normal breakfast and at lunchtime had soup instead. Throughout the afternoon his lower abdominal crampy pain progressed and was associated with nausea without vomiting. Previously his bowel movements had been normal with last bowel movement yesterday morning. He has not had any fever, chills, shortness of breath, chest pain, dysuria, or frequency. A CT scan in the emergency room showed partial small bowel obstruction with possible stricture at the small bowel anastomosis. He was treated with IV fluids and IV antibiotics. Currently his lower abdominal pain is much less than at the time of initial evaluation and  he has very mild nausea. He had 2 attempts at placement of a gastric tube that were unsuccessful.  Past Medical History  Diagnosis Date  . Hyperlipidemia   . Hypertension   . Carotid artery occlusion     s/p L CEA in 2010  . Crohn's disease     colon resection  . Bradycardia     previous intolerance to beta blockers - low dose toprol started 10/2011 for tachycardia but had rash and was discontinued  . Chronic systolic heart failure     echo 7/12: EF 35-40%, anteroseptal hypokinesis, mild MR.  . Ischemic cardiomyopathy     EF 34% (Lexiscan 06/2011), 35-40% (echo 11/2010)  . Coronary artery disease     CABG 1987, redo 2008; Last LHC 1/10: SVG-OM 1/OM 2 patent, SVG-LAD patent, SVG-OM 3 Patent  . Peripheral vascular disease     Carotid dz s/p L CEA, RAS, AAA, LE dz  . Renal artery stenosis     Dopplers 12/2010 - 1-59% R ostial renal artery stenosis, 2 L renal arteries both widely patent  . Gouty arthritis   . AAA (abdominal aortic aneurysm)     Ectatic abdominal aorta with  fusiform dilitation 3.4x3.4 and moderate thrombus burden 12/2010  . RBBB   . Tachycardia     a. 10/2011 - atrial tach vs avnrt - BB started.  . Myocardial infarction   . COPD (chronic obstructive pulmonary disease)     Medications Prior to Admission  Medication Sig Dispense Refill  . acetaminophen (TYLENOL) 500 MG tablet Take 1,000 mg by mouth every 6 (six) hours as needed for pain.      [name] amLODipine (NORVASC) 5 MG tablet Take 5 mg by mouth daily.      [name] aspirin 81 MG tablet Take 81 mg by mouth daily.       [name] atorvastatin (LIPITOR) 10 MG tablet Take 10 mg by mouth at bedtime.       . clopidogrel (PLAVIX) 75 MG tablet Take 75 mg by mouth daily.      [name] COLCRYS 0.6 MG tablet Take 0.6 mg by mouth daily as needed (gout).       . fexofenadine (ALLEGRA) 180 MG tablet Take 180 mg by mouth daily as needed (seasonal allergies).       [name] ipratropium (ATROVENT) 0.03 % nasal spray Place 1 spray into the nose daily.       . isosorbide mononitrate (IMDUR) 60 MG 24 hr tablet Take 1 tablet (60 mg total) by mouth 2 (two) times daily.  180 tablet  3  .  losartan (COZAAR) 100 MG tablet Take 1 tablet (100 mg total) by mouth daily.  90 tablet  3  . Multiple Vitamins-Minerals (MULTIVITAMIN PO) Take 1 tablet by mouth daily.      . nitroGLYCERIN (NITROSTAT) 0.4 MG SL tablet Place 0.4 mg under the tongue every 5 (five) minutes as needed for chest pain.      . pantoprazole (PROTONIX) 40 MG tablet Take 1 tablet (40 mg total) by mouth daily.  90 tablet  3  . propranolol (INDERAL) 10 MG tablet Take 1 tablet as needed for heart rate >110      . ranolazine (RANEXA) 500 MG 12 hr tablet Take 1 tablet (500 mg total) by mouth 2 (two) times daily.  180 tablet  3  . vitamin B-12 (CYANOCOBALAMIN) 100 MCG tablet Take 50 mcg by mouth daily.        ADDITIONAL HOME MEDICATIONS: no additional meds  PHYSICIANS INVOLVED IN CARE: Jarome Matin (PCP), Leonides Sake (vsurg), Dr. Morton Peters (csurg)  Past Surgical History  Procedure  Laterality Date  . Carotid endarterectomy  2010    left  . Colon resection    . Tonsillectomy      as a child  . Pr vein bypass graft,aorto-fem-pop  10/1985  . Vein bypass surgery      LEFT   06/2011  . Endarterectomy  08/26/2011    Procedure: ENDARTERECTOMY ILIAC;  Surgeon: [name], MD;  Location: [facility] OR;  Service: Vascular;  Laterality: Right;  Right Femoral Artery Endarterectomy with vascu guard patch angioplasty & intraoperative arteriogram.  . Coronary artery bypass graft  10/31/85 & 08/19/06  . Cardiac catheterization  07/28/2012    Native 3v CAD, continued patency of the SVG-OM1-LAD and SVG-OM2-left PDA. LVEF 40% with multiple WMAs    Family History  Problem Relation Age of Onset  . Heart disease Father   . Cancer Mother   . COPD Mother   . Anesthesia problems Neg Hx   . Deep vein thrombosis Brother   . COPD Brother   . CAD Brother      Social History:  reports that he has never smoked. He has never used smokeless tobacco. He reports that he does not drink alcohol or use illicit drugs.  Allergies:  Allergies  Allergen Reactions  . Novocain [Procaine Hcl] Other (See Comments)    Cold sweats and very bad headache.   . Metoprolol Hives, Itching and Rash     ROS: Heart disease, high blood pressure and kidney disease  PHYSICAL EXAM: Blood pressure 150/93, pulse 111, temperature 97.5 F (36.4 C), temperature source Oral, resp. rate 18, height 5\' 7"  (1.702 m), weight 60.555 kg (133 lb 8 oz), SpO2 96.00%. In general, the patient is athin elderly white man who was in no apparent distress while lying partially upright in bed. HEENT exam was within normal limits, neck was supple without jugular venous distention or carotid bruit, chest was clear to auscultation, heart had a regular rate and rhythm without significant murmur or gallop, abdomen had reduced but present bowel sounds with bilateral lower clot present bruits. There was moderate distention with tympany in all regions  of the abdomen with the exception of the right lower part her. There was no significant tenderness. Extremities were without cyanosis, clubbing, or edema. He was alert and well oriented with normal affect and could move all extremities well.  Results for orders placed during the hospital encounter of 02/09/13 (from the past 48 hour(s))  CBC WITH DIFFERENTIAL  Status: Abnormal   Collection Time    02/09/13 11:30 PM      Result Value Range   WBC 10.8 (*) 4.0 - 10.5 K/uL   RBC 4.02 (*) 4.22 - 5.81 MIL/uL   Hemoglobin 13.3  13.0 - 17.0 g/dL   HCT 16.1 (*) 09.6 - 04.5 %   MCV 96.3  78.0 - 100.0 fL   MCH 33.1  26.0 - 34.0 pg   MCHC 34.4  30.0 - 36.0 g/dL   RDW 40.9  81.1 - 91.4 %   Platelets 147 (*) 150 - 400 K/uL   Neutrophils Relative % 85 (*) 43 - 77 %   Neutro Abs 9.1 (*) 1.7 - 7.7 K/uL   Lymphocytes Relative 8 (*) 12 - 46 %   Lymphs Abs 0.9  0.7 - 4.0 K/uL   Monocytes Relative 7  3 - 12 %   Monocytes Absolute 0.8  0.1 - 1.0 K/uL   Eosinophils Relative 0  0 - 5 %   Eosinophils Absolute 0.0  0.0 - 0.7 K/uL   Basophils Relative 0  0 - 1 %   Basophils Absolute 0.0  0.0 - 0.1 K/uL  COMPREHENSIVE METABOLIC PANEL     Status: Abnormal   Collection Time    02/09/13 11:30 PM      Result Value Range   Sodium 140  135 - 145 mEq/L   Potassium 4.2  3.5 - 5.1 mEq/L   Chloride 101  96 - 112 mEq/L   CO2 27  19 - 32 mEq/L   Glucose, Bld 118 (*) 70 - 99 mg/dL   BUN 22  6 - 23 mg/dL   Creatinine, Ser 7.82  0.50 - 1.35 mg/dL   Calcium 9.8  8.4 - 95.6 mg/dL   Total Protein 7.3  6.0 - 8.3 g/dL   Albumin 4.1  3.5 - 5.2 g/dL   AST 21  0 - 37 U/L   ALT 17  0 - 53 U/L   Alkaline Phosphatase 86  39 - 117 U/L   Total Bilirubin 0.5  0.3 - 1.2 mg/dL   GFR calc non Af Amer 50 (*) >90 mL/min   GFR calc Af Amer 58 (*) >90 mL/min   Comment: (NOTE)     The eGFR has been calculated using the CKD EPI equation.     This calculation has not been validated in all clinical situations.     eGFR's  persistently <90 mL/min signify possible Chronic Kidney     Disease.  LIPASE, BLOOD     Status: None   Collection Time    02/09/13 11:30 PM      Result Value Range   Lipase 11  11 - 59 U/L  CG4 I-STAT (LACTIC ACID)     Status: None   Collection Time    02/09/13 11:57 PM      Result Value Range   Lactic Acid, Venous 1.05  0.5 - 2.2 mmol/L  URINALYSIS, ROUTINE W REFLEX MICROSCOPIC     Status: Abnormal   Collection Time    02/10/13 12:59 AM      Result Value Range   Color, Urine YELLOW  YELLOW   APPearance HAZY (*) CLEAR   Specific Gravity, Urine 1.016  1.005 - 1.030   pH 5.5  5.0 - 8.0   Glucose, UA NEGATIVE  NEGATIVE mg/dL   Hgb urine dipstick NEGATIVE  NEGATIVE   Bilirubin Urine NEGATIVE  NEGATIVE   Ketones, ur NEGATIVE  NEGATIVE mg/dL  Protein, ur NEGATIVE  NEGATIVE mg/dL   Urobilinogen, UA 0.2  0.0 - 1.0 mg/dL   Nitrite NEGATIVE  NEGATIVE   Leukocytes, UA NEGATIVE  NEGATIVE   Comment: MICROSCOPIC NOT DONE ON URINES WITH NEGATIVE PROTEIN, BLOOD, LEUKOCYTES, NITRITE, OR GLUCOSE <1000 mg/dL.   Ct Abdomen Pelvis W Contrast  02/10/2013   CLINICAL DATA:  Lower abdominal pain, nausea, vomiting.  EXAM: CT ABDOMEN AND PELVIS WITH CONTRAST  TECHNIQUE: Multidetector CT imaging of the abdomen and pelvis was performed using the standard protocol following bolus administration of intravenous contrast.  CONTRAST:  80mL OMNIPAQUE IOHEXOL 300 MG/ML  SOLN  COMPARISON:  08/25/2006  FINDINGS: Minimal bibasilar atelectasis. Heart is borderline in size. No effusions.  Small gallstone layering within the gallbladder. Liver, spleen, pancreas, adrenals are unremarkable. Small cysts in the right kidney. Left kidney unremarkable. No hydronephrosis.  3.0 cm infrarenal abdominal aortic aneurysm with extensive mural plaque.  Postsurgical changes in the right lower quadrant. There are dilated small bowel loops with air-fluid levels. Findings compatible with small bowel obstruction. Small bowel appears to be  dilated to the neo terminal ileum with postsurgical changes in the right lower quadrant. Cannot exclude an anastomotic stricture. Large bowel unremarkable.  No free fluid, free air or adenopathy. Urinary bladder is unremarkable.  IMPRESSION: Dilated small bowel to the level of the neo terminal ileum. There is fecalization noted in the distal small bowel loops. Findings concerning for small bowel obstruction, possibly related to stricture at the neo terminal ileum anastomosis.  3 cm infrarenal abdominal aortic aneurysm.  Cholelithiasis.   Electronically Signed   By: Charlett Nose M.D.   On: 02/10/2013 03:03     Assessment/Plan #1 Abdominal Pain: due to partial SBO. Will try to clean distal bowel and repeat abdomen X-rays. If x ray appearance worsens or sxs worsen then will have IR attempt NG tube placement. #2 CAD: stable and will add nitrodur since NPO now. #3 HTN: stable #4 Chronic Kidney Disease: stable and will start IVF to maintain euvolemia.  Sakshi Sermons G 02/10/2013, 8:05 AM

## 2013-02-10 NOTE — ED Notes (Signed)
Family member (Darin) gave the following number for contact when pt's disposition is known:  [phone]

## 2013-02-10 NOTE — ED Notes (Signed)
Third attempt to pass NG tube made by Dr Norlene Campbell without success

## 2013-02-10 NOTE — Progress Notes (Signed)
Utilization review completed. Semaja Lymon, RN, BSN. 

## 2013-02-10 NOTE — ED Notes (Signed)
NG tube placement attempt made in left nare without success.  MD made aware

## 2013-02-10 NOTE — Care Management Note (Unsigned)
    Page 1 of 1   02/10/2013     3:26:53 PM   CARE MANAGEMENT NOTE 02/10/2013  Patient:  Karl Stevenson, Karl Stevenson   Account Number:  [id number]  Date Initiated:  02/10/2013  Documentation initiated by:  Taffany Heiser  Subjective/Objective Assessment:   PT ADM ON 02/09/13 WITH ABD PAIN, SBO.  PTA, PT RESIDES AT HOME WITH SPOUSE.     Action/Plan:   WILL FOLLOW FOR DISCHARGE NEEDS AS PT PROGRESSES.   Anticipated DC Date:  02/14/2013   Anticipated DC Plan:  HOME W HOME HEALTH SERVICES      DC Planning Services  CM consult      Choice offered to / List presented to:             Status of service:  In process, will continue to follow Medicare Important Message given?   (If response is "NO", the following Medicare IM given date fields will be blank) Date Medicare IM given:   Date Additional Medicare IM given:    Discharge Disposition:    Per UR Regulation:  Reviewed for med. necessity/level of care/duration of stay  If discussed at Long Length of Stay Meetings, dates discussed:    Comments:

## 2013-02-10 NOTE — Progress Notes (Signed)
Soap suds enema given per order, Pt as passed large soft stool Karl Stevenson A

## 2013-02-11 ENCOUNTER — Inpatient Hospital Stay (HOSPITAL_COMMUNITY): Payer: Medicare PPO

## 2013-02-11 LAB — COMPREHENSIVE METABOLIC PANEL
ALT: 14 U/L (ref 0–53)
AST: 20 U/L (ref 0–37)
Albumin: 3.7 g/dL (ref 3.5–5.2)
Alkaline Phosphatase: 77 U/L (ref 39–117)
CO2: 25 mEq/L (ref 19–32)
Calcium: 8.9 mg/dL (ref 8.4–10.5)
Chloride: 106 mEq/L (ref 96–112)
Potassium: 3.8 mEq/L (ref 3.5–5.1)
Sodium: 144 mEq/L (ref 135–145)
Total Bilirubin: 0.7 mg/dL (ref 0.3–1.2)
Total Protein: 7 g/dL (ref 6.0–8.3)

## 2013-02-11 LAB — GLUCOSE, CAPILLARY
Glucose-Capillary: 92 mg/dL (ref 70–99)
Glucose-Capillary: 105 mg/dL — ABNORMAL HIGH (ref 70–99)

## 2013-02-11 LAB — CBC
HCT: 39.4 % (ref 39.0–52.0)
Hemoglobin: 13.5 g/dL (ref 13.0–17.0)
MCH: 33.6 pg (ref 26.0–34.0)
MCHC: 34.3 g/dL (ref 30.0–36.0)
MCV: 98 fL (ref 78.0–100.0)
WBC: 7.2 10*3/uL (ref 4.0–10.5)

## 2013-02-11 MED ORDER — PHENOL 1.4 % MT LIQD
1.0000 | OROMUCOSAL | Status: DC | PRN
  Administered 2013-02-11: 1 via OROMUCOSAL
  Filled 2013-02-11: qty 177

## 2013-02-11 NOTE — Progress Notes (Signed)
Subjective: Awake alert pleasant no distress. Claims to feel some better. No chest pain or shortness of breath. One small bowel movement. Some gas.  Objective: Vital signs in last 24 hours: Temp:  [97.7 F (36.5 C)-98.7 F (37.1 C)] 97.7 F (36.5 C) (10/04 0400) Pulse Rate:  [61-95] 61 (10/04 0400) Resp:  [18] 18 (10/04 0400) BP: (116-155)/(62-85) 116/62 mmHg (10/04 0400) SpO2:  [92 %-96 %] 92 % (10/04 0400) Weight:  [58.9 kg (129 lb 13.6 oz)] 58.9 kg (129 lb 13.6 oz) (10/04 0400) Weight change: -2.335 kg (-5 lb 2.4 oz)  CBG (last 3)   Recent Labs  02/10/13 2126 02/11/13 0625  GLUCAP 111* 104*    Intake/Output from previous day: 10/03 0701 - 10/04 0700 In: 0  Out: 250 [Emesis/NG output:250]  Physical Exam: Alert pleasant NG tube in place. Lying flat without distress. No JVD or bruits. Lungs are clear. Her neurovascular exam is regular rate and rhythm no murmur. Abdomen is soft nondistended. No peripheral edema. Neurologically normal.   Lab Results:  Recent Labs  02/09/13 2330 02/11/13 0625  NA 140 144  K 4.2 3.8  CL 101 106  CO2 27 25  GLUCOSE 118* 103*  BUN 22 19  CREATININE 1.28 1.30  CALCIUM 9.8 8.9    Recent Labs  02/09/13 2330 02/11/13 0625  AST 21 20  ALT 17 14  ALKPHOS 86 77  BILITOT 0.5 0.7  PROT 7.3 7.0  ALBUMIN 4.1 3.7    Recent Labs  02/09/13 2330 02/11/13 0625  WBC 10.8* 7.2  NEUTROABS 9.1*  --   HGB 13.3 13.5  HCT 38.7* 39.4  MCV 96.3 98.0  PLT 147* 160   Lab Results  Component Value Date   INR 1.05 07/27/2012   INR 1.03 08/12/2011   INR 1.03 06/26/2011   No results found for this basename: CKTOTAL, CKMB, CKMBINDEX, TROPONINI,  in the last 72 hours No results found for this basename: TSH, T4TOTAL, FREET3, T3FREE, THYROIDAB,  in the last 72 hours No results found for this basename: VITAMINB12, FOLATE, FERRITIN, TIBC, IRON, RETICCTPCT,  in the last 72 hours  Studies/Results: Dg Abd 1 View  02/10/2013   CLINICAL DATA:  NG tube  placed  EXAM: ABDOMEN - 1 VIEW  COMPARISON:  None.  FINDINGS: NG tube tip is in the antrum of the stomach. Mild gastric distension. Mild small-bowel distention. Mild colonic distention. No free intraperitoneal gas.  IMPRESSION: NG tube tip in the antrum of the stomach. Ileus pattern.   Electronically Signed   By: Maryclare Bean M.D.   On: 02/10/2013 15:34   Ct Abdomen Pelvis W Contrast  02/10/2013   CLINICAL DATA:  Lower abdominal pain, nausea, vomiting.  EXAM: CT ABDOMEN AND PELVIS WITH CONTRAST  TECHNIQUE: Multidetector CT imaging of the abdomen and pelvis was performed using the standard protocol following bolus administration of intravenous contrast.  CONTRAST:  80mL OMNIPAQUE IOHEXOL 300 MG/ML  SOLN  COMPARISON:  08/25/2006  FINDINGS: Minimal bibasilar atelectasis. Heart is borderline in size. No effusions.  Small gallstone layering within the gallbladder. Liver, spleen, pancreas, adrenals are unremarkable. Small cysts in the right kidney. Left kidney unremarkable. No hydronephrosis.  3.0 cm infrarenal abdominal aortic aneurysm with extensive mural plaque.  Postsurgical changes in the right lower quadrant. There are dilated small bowel loops with air-fluid levels. Findings compatible with small bowel obstruction. Small bowel appears to be dilated to the neo terminal ileum with postsurgical changes in the right lower quadrant. Cannot exclude an  anastomotic stricture. Large bowel unremarkable.  No free fluid, free air or adenopathy. Urinary bladder is unremarkable.  IMPRESSION: Dilated small bowel to the level of the neo terminal ileum. There is fecalization noted in the distal small bowel loops. Findings concerning for small bowel obstruction, possibly related to stricture at the neo terminal ileum anastomosis.  3 cm infrarenal abdominal aortic aneurysm.  Cholelithiasis.   Electronically Signed   By: Charlett Nose M.D.   On: 02/10/2013 03:03   Dg Abd 2 Views  02/11/2013   *RADIOLOGY REPORT*  Clinical Data:  Evaluate small bowel obstruction  ABDOMEN - 2 VIEW  Comparison: 02/10/2013; CT abdomen pelvis - 02/10/2013  Findings:  Interval there is an enteric tube with tip inside port projected over the expected location the descending portion of the duodenum.  Interval passage of previously noted enteric contrast now seen within the distal colon.  There is persistent mild gaseous distension of multiple loops of small bowel with several air fluid levels noted within the right mid hemiabdomen.  No pneumoperitoneum, pneumatosis or portal venous gas.  Limited visualization of the lower thorax is normal.  Multilevel thoracolumbar spine degenerative change.  IMPRESSION: 1.  Grossly unchanged findings most suggestive of ileus though partial small bowel obstruction not excluded. 2.  Interval advancement of enteric tube with tip and sideport projecting over the descending portion of the duodenum.   Original Report Authenticated By: Tacey Ruiz, MD   Dg [facility] W/fl-no Rad  02/10/2013   CLINICAL DATA: needs ngt for sbo treatment, cannot place on floor   NASO G TUBE PLACEMENT WITH FLUORO  Fluoroscopy was utilized by the requesting physician.  No radiographic  interpretation.      Assessment/Plan: #1 small bowel obstruction with NG tube in place some improvement #2 coronary artery disease stable #3 essential hypertension stable #4 chronic kidney disease currently at baseline  Continue NG tube, bowel rest, ambulation and support with IV fluids   LOS: 2 days   Jezabel Lecker A 02/11/2013, 9:29 AM

## 2013-02-11 NOTE — Progress Notes (Signed)
Subjective: Some flatus and 1 small BM No pain  Objective: Vital signs in last 24 hours: Temp:  [97.7 F (36.5 C)-98.7 F (37.1 C)] 97.7 F (36.5 C) (10/04 0400) Pulse Rate:  [61-95] 61 (10/04 0400) Resp:  [18] 18 (10/04 0400) BP: (116-155)/(62-85) 116/62 mmHg (10/04 0400) SpO2:  [92 %-96 %] 92 % (10/04 0400) Weight:  [129 lb 13.6 oz (58.9 kg)] 129 lb 13.6 oz (58.9 kg) (10/04 0400) Last BM Date: 02/10/13  Intake/Output from previous day: 10/03 0701 - 10/04 0700 In: 0  Out: 100 [Emesis/NG output:100] Intake/Output this shift:    GI: soft slight distension.  no tenderness.    Lab Results:   Recent Labs  02/09/13 2330 02/11/13 0625  WBC 10.8* 7.2  HGB 13.3 13.5  HCT 38.7* 39.4  PLT 147* 160   BMET  Recent Labs  02/09/13 2330  NA 140  K 4.2  CL 101  CO2 27  GLUCOSE 118*  BUN 22  CREATININE 1.28  CALCIUM 9.8   PT/INR No results found for this basename: LABPROT, INR,  in the last 72 hours ABG No results found for this basename: PHART, PCO2, PO2, HCO3,  in the last 72 hours  Studies/Results: Dg Abd 1 View  02/10/2013   CLINICAL DATA:  NG tube placed  EXAM: ABDOMEN - 1 VIEW  COMPARISON:  None.  FINDINGS: NG tube tip is in the antrum of the stomach. Mild gastric distension. Mild small-bowel distention. Mild colonic distention. No free intraperitoneal gas.  IMPRESSION: NG tube tip in the antrum of the stomach. Ileus pattern.   Electronically Signed   By: Maryclare Bean M.D.   On: 02/10/2013 15:34   Ct Abdomen Pelvis W Contrast  02/10/2013   CLINICAL DATA:  Lower abdominal pain, nausea, vomiting.  EXAM: CT ABDOMEN AND PELVIS WITH CONTRAST  TECHNIQUE: Multidetector CT imaging of the abdomen and pelvis was performed using the standard protocol following bolus administration of intravenous contrast.  CONTRAST:  80mL OMNIPAQUE IOHEXOL 300 MG/ML  SOLN  COMPARISON:  08/25/2006  FINDINGS: Minimal bibasilar atelectasis. Heart is borderline in size. No effusions.  Small  gallstone layering within the gallbladder. Liver, spleen, pancreas, adrenals are unremarkable. Small cysts in the right kidney. Left kidney unremarkable. No hydronephrosis.  3.0 cm infrarenal abdominal aortic aneurysm with extensive mural plaque.  Postsurgical changes in the right lower quadrant. There are dilated small bowel loops with air-fluid levels. Findings compatible with small bowel obstruction. Small bowel appears to be dilated to the neo terminal ileum with postsurgical changes in the right lower quadrant. Cannot exclude an anastomotic stricture. Large bowel unremarkable.  No free fluid, free air or adenopathy. Urinary bladder is unremarkable.  IMPRESSION: Dilated small bowel to the level of the neo terminal ileum. There is fecalization noted in the distal small bowel loops. Findings concerning for small bowel obstruction, possibly related to stricture at the neo terminal ileum anastomosis.  3 cm infrarenal abdominal aortic aneurysm.  Cholelithiasis.   Electronically Signed   By: Charlett Nose M.D.   On: 02/10/2013 03:03   Dg Abd 2 Views  02/11/2013   *RADIOLOGY REPORT*  Clinical Data: Evaluate small bowel obstruction  ABDOMEN - 2 VIEW  Comparison: 02/10/2013; CT abdomen pelvis - 02/10/2013  Findings:  Interval there is an enteric tube with tip inside port projected over the expected location the descending portion of the duodenum.  Interval passage of previously noted enteric contrast now seen within the distal colon.  There is persistent mild gaseous  distension of multiple loops of small bowel with several air fluid levels noted within the right mid hemiabdomen.  No pneumoperitoneum, pneumatosis or portal venous gas.  Limited visualization of the lower thorax is normal.  Multilevel thoracolumbar spine degenerative change.  IMPRESSION: 1.  Grossly unchanged findings most suggestive of ileus though partial small bowel obstruction not excluded. 2.  Interval advancement of enteric tube with tip and  sideport projecting over the descending portion of the duodenum.   Original Report Authenticated By: Tacey Ruiz, MD   Dg Joslyn Hy Plc W/fl-no Rad  02/10/2013   CLINICAL DATA: needs ngt for sbo treatment, cannot place on floor   NASO G TUBE PLACEMENT WITH FLUORO  Fluoroscopy was utilized by the requesting physician.  No radiographic  interpretation.     Anti-infectives: Anti-infectives   None      Assessment/Plan:  LOS: 2 days  Partial SBO Some flatus No pain Films show contrast in colon Cont NGT for today and NPO. Repeat films in am.  Needs to ambulate  Mariene Dickerman A. 02/11/2013

## 2013-02-12 ENCOUNTER — Inpatient Hospital Stay (HOSPITAL_COMMUNITY): Payer: Medicare PPO

## 2013-02-12 LAB — COMPREHENSIVE METABOLIC PANEL
ALT: 13 U/L (ref 0–53)
Alkaline Phosphatase: 67 U/L (ref 39–117)
BUN: 14 mg/dL (ref 6–23)
Chloride: 105 mEq/L (ref 96–112)
GFR calc Af Amer: 65 mL/min — ABNORMAL LOW (ref >90)
Glucose, Bld: 109 mg/dL — ABNORMAL HIGH (ref 70–99)
Potassium: 3.9 mEq/L (ref 3.5–5.1)
Sodium: 140 mEq/L (ref 135–145)
Total Bilirubin: 0.5 mg/dL (ref 0.3–1.2)
Total Protein: 6.3 g/dL (ref 6.0–8.3)

## 2013-02-12 LAB — GLUCOSE, CAPILLARY: Glucose-Capillary: 95 mg/dL (ref 70–99)

## 2013-02-12 LAB — CBC
HCT: 35.8 % — ABNORMAL LOW (ref 39.0–52.0)
Hemoglobin: 11.9 g/dL — ABNORMAL LOW (ref 13.0–17.0)
MCHC: 33.2 g/dL (ref 30.0–36.0)
MCV: 98.4 fL (ref 78.0–100.0)
Platelets: 126 10*3/uL — ABNORMAL LOW (ref 150–400)
RBC: 3.64 MIL/uL — ABNORMAL LOW (ref 4.22–5.81)
WBC: 7.8 10*3/uL (ref 4.0–10.5)

## 2013-02-12 NOTE — Progress Notes (Signed)
[facility] Progress Note    Subjective: Patient is awake and alert.  States that he did have another bowel movement this morning.  Objective: Vital signs in last 24 hours: Temp:  [98.1 F (36.7 C)-98.8 F (37.1 C)] 98.8 F (37.1 C) (10/05 0526) Pulse Rate:  [67-87] 83 (10/05 0526) Resp:  [18] 18 (10/05 0526) BP: (122-155)/(64-74) 122/64 mmHg (10/05 0526) SpO2:  [95 %-100 %] 95 % (10/05 0526) Weight:  [58.559 kg (129 lb 1.6 oz)] 58.559 kg (129 lb 1.6 oz) (10/05 0258) Last BM Date: 02/10/13  Intake/Output from previous day: 10/04 0701 - 10/05 0700 In: 3208.8 [I.V.:3208.8] Out: 1200 [Urine:750; Emesis/NG output:450] Intake/Output this shift:    General: No acute distress  Lungs: Clear  Abd: Soft, mildly distended.  Has bowel sounds.  Passed flatus and had BM  Extremities: No clinical signs of DVT  Neuro: Intact  Lab Results:  @LABLAST2 (wbc:2,hgb:2,hct:2,plt:2) BMET  Recent Labs  02/11/13 0625 02/12/13 0600  NA 144 140  K 3.8 3.9  CL 106 105  CO2 25 25  GLUCOSE 103* 109*  BUN 19 14  CREATININE 1.30 1.17  CALCIUM 8.9 8.8   PT/INR No results found for this basename: LABPROT, INR,  in the last 72 hours ABG No results found for this basename: PHART, PCO2, PO2, HCO3,  in the last 72 hours  Studies/Results: Dg Abd 1 View  02/10/2013   CLINICAL DATA:  NG tube placed  EXAM: ABDOMEN - 1 VIEW  COMPARISON:  None.  FINDINGS: NG tube tip is in the antrum of the stomach. Mild gastric distension. Mild small-bowel distention. Mild colonic distention. No free intraperitoneal gas.  IMPRESSION: NG tube tip in the antrum of the stomach. Ileus pattern.   Electronically Signed   By: Maryclare Bean M.D.   On: 02/10/2013 15:34   Dg Abd 2 Views  02/11/2013   *RADIOLOGY REPORT*  Clinical Data: Evaluate small bowel obstruction  ABDOMEN - 2 VIEW  Comparison: 02/10/2013; CT abdomen pelvis - 02/10/2013  Findings:  Interval there is an enteric tube with tip inside port projected over the  expected location the descending portion of the duodenum.  Interval passage of previously noted enteric contrast now seen within the distal colon.  There is persistent mild gaseous distension of multiple loops of small bowel with several air fluid levels noted within the right mid hemiabdomen.  No pneumoperitoneum, pneumatosis or portal venous gas.  Limited visualization of the lower thorax is normal.  Multilevel thoracolumbar spine degenerative change.  IMPRESSION: 1.  Grossly unchanged findings most suggestive of ileus though partial small bowel obstruction not excluded. 2.  Interval advancement of enteric tube with tip and sideport projecting over the descending portion of the duodenum.   Original Report Authenticated By: Tacey Ruiz, MD   Dg [facility] Hy [facility]c W/fl-no Rad  02/10/2013   CLINICAL DATA: needs ngt for sbo treatment, cannot place on floor   NASO G TUBE PLACEMENT WITH FLUORO  Fluoroscopy was utilized by the requesting physician.  No radiographic  interpretation.     Anti-infectives: Anti-infectives   None      Assessment/Plan: s/p  SBO improving.Clamp NGT   LOS: 3 days   Marta Lamas. Gae Bon, MD, FACS [phone] [facility] 02/12/2013

## 2013-02-12 NOTE — Progress Notes (Signed)
Subjective: Patient is in good spirits NG tube still in place draining significantly. He does relate small stool this morning. No pain.  Objective: Vital signs in last 24 hours: Temp:  [98.1 F (36.7 C)-98.8 F (37.1 C)] 98.8 F (37.1 C) (10/05 0526) Pulse Rate:  [67-87] 83 (10/05 0526) Resp:  [18] 18 (10/05 0526) BP: (122-155)/(64-74) 122/64 mmHg (10/05 0526) SpO2:  [95 %-100 %] 95 % (10/05 0526) Weight:  [58.559 kg (129 lb 1.6 oz)] 58.559 kg (129 lb 1.6 oz) (10/05 0258) Weight change: -0.341 kg (-12 oz)  CBG (last 3)   Recent Labs  02/11/13 1615 02/11/13 2145 02/12/13 0606  GLUCAP 92 105* 95    Intake/Output from previous day: 10/04 0701 - 10/05 0700 In: 3208.8 [I.V.:3208.8] Out: 1200 [Urine:750; Emesis/NG output:450]  Physical Exam: Alert pleasant NG tube in place. Lying flat without distress. No JVD or bruits. Lungs are clear. Her neurovascular exam is regular rate and rhythm no murmur. Abdomen is soft nondistended. No peripheral edema. Neurologically normal.       Lab Results:  Recent Labs  02/11/13 0625 02/12/13 0600  NA 144 140  K 3.8 3.9  CL 106 105  CO2 25 25  GLUCOSE 103* 109*  BUN 19 14  CREATININE 1.30 1.17  CALCIUM 8.9 8.8    Recent Labs  02/11/13 0625 02/12/13 0600  AST 20 17  ALT 14 13  ALKPHOS 77 67  BILITOT 0.7 0.5  PROT 7.0 6.3  ALBUMIN 3.7 3.3*    Recent Labs  02/09/13 2330 02/11/13 0625 02/12/13 0600  WBC 10.8* 7.2 7.8  NEUTROABS 9.1*  --   --   HGB 13.3 13.5 11.9*  HCT 38.7* 39.4 35.8*  MCV 96.3 98.0 98.4  PLT 147* 160 126*   Lab Results  Component Value Date   INR 1.05 07/27/2012   INR 1.03 08/12/2011   INR 1.03 06/26/2011   No results found for this basename: CKTOTAL, CKMB, CKMBINDEX, TROPONINI,  in the last 72 hours No results found for this basename: TSH, T4TOTAL, FREET3, T3FREE, THYROIDAB,  in the last 72 hours No results found for this basename: VITAMINB12, FOLATE, FERRITIN, TIBC, IRON, RETICCTPCT,  in the  last 72 hours  Studies/Results: Dg Abd 1 View  02/10/2013   CLINICAL DATA:  NG tube placed  EXAM: ABDOMEN - 1 VIEW  COMPARISON:  None.  FINDINGS: NG tube tip is in the antrum of the stomach. Mild gastric distension. Mild small-bowel distention. Mild colonic distention. No free intraperitoneal gas.  IMPRESSION: NG tube tip in the antrum of the stomach. Ileus pattern.   Electronically Signed   By: Maryclare Bean M.D.   On: 02/10/2013 15:34   Dg Abd 2 Views  02/11/2013   *RADIOLOGY REPORT*  Clinical Data: Evaluate small bowel obstruction  ABDOMEN - 2 VIEW  Comparison: 02/10/2013; CT abdomen pelvis - 02/10/2013  Findings:  Interval there is an enteric tube with tip inside port projected over the expected location the descending portion of the duodenum.  Interval passage of previously noted enteric contrast now seen within the distal colon.  There is persistent mild gaseous distension of multiple loops of small bowel with several air fluid levels noted within the right mid hemiabdomen.  No pneumoperitoneum, pneumatosis or portal venous gas.  Limited visualization of the lower thorax is normal.  Multilevel thoracolumbar spine degenerative change.  IMPRESSION: 1.  Grossly unchanged findings most suggestive of ileus though partial small bowel obstruction not excluded. 2.  Interval advancement of enteric  tube with tip and sideport projecting over the descending portion of the duodenum.   Original Report Authenticated By: Tacey Ruiz, MD   Dg Joslyn Hy Plc W/fl-no Rad  02/10/2013   CLINICAL DATA: needs ngt for sbo treatment, cannot place on floor   NASO G TUBE PLACEMENT WITH FLUORO  Fluoroscopy was utilized by the requesting physician.  No radiographic  interpretation.      Assessment/Plan:  #1 small bowel obstruction with NG tube in place some improvement  #2 coronary artery disease stable  #3 essential hypertension stable  #4 chronic kidney disease currently at baseline  Continue NG tube, bowel rest,  ambulation and support with IV fluids      LOS: 3 days   Kaan Tosh A 02/12/2013, 9:32 AM

## 2013-02-13 MED ORDER — METHYLPREDNISOLONE SODIUM SUCC 125 MG IJ SOLR
60.0000 mg | Freq: Four times a day (QID) | INTRAMUSCULAR | Status: DC
  Administered 2013-02-13 – 2013-02-14 (×6): 60 mg via INTRAVENOUS
  Filled 2013-02-13 (×8): qty 0.96

## 2013-02-13 MED ORDER — KCL IN DEXTROSE-NACL 20-5-0.9 MEQ/L-%-% IV SOLN
INTRAVENOUS | Status: DC
  Filled 2013-02-13 (×2): qty 1000

## 2013-02-13 MED ORDER — BISACODYL 10 MG RE SUPP
10.0000 mg | Freq: Once | RECTAL | Status: AC
  Administered 2013-02-13: 10 mg via RECTAL
  Filled 2013-02-13: qty 1

## 2013-02-13 MED ORDER — MAGNESIUM HYDROXIDE 400 MG/5ML PO SUSP
30.0000 mL | Freq: Once | ORAL | Status: AC
  Administered 2013-02-13: 30 mL via ORAL
  Filled 2013-02-13: qty 30

## 2013-02-13 MED ORDER — POTASSIUM CHLORIDE IN NACL 20-0.9 MEQ/L-% IV SOLN
INTRAVENOUS | Status: DC
  Administered 2013-02-13: 1000 mL via INTRAVENOUS
  Administered 2013-02-13 – 2013-02-14 (×2): via INTRAVENOUS
  Filled 2013-02-13 (×4): qty 1000

## 2013-02-13 NOTE — Progress Notes (Signed)
  Subjective: Had another BM, no nausea with NGT clamped yesterday  Objective: Vital signs in last 24 hours: Temp:  [98.3 F (36.8 C)-98.6 F (37 C)] 98.3 F (36.8 C) (10/06 0522) Pulse Rate:  [90-114] 114 (10/06 0522) Resp:  [16] 16 (10/06 0522) BP: (93-145)/(60-92) 93/60 mmHg (10/06 0522) SpO2:  [96 %-99 %] 96 % (10/06 0522) Weight:  [58.151 kg (128 lb 3.2 oz)] 58.151 kg (128 lb 3.2 oz) (10/06 0522) Last BM Date: 02/12/13  Intake/Output from previous day: 10/05 0701 - 10/06 0700 In: 2050 [I.V.:2050] Out: 150  Intake/Output this shift:    General appearance: alert and cooperative Resp: clear to auscultation bilaterally GI: soft, less distended, NT, +BS Neurologic: Mental status: Alert, oriented, thought content appropriate  Lab Results:   Recent Labs  02/11/13 0625 02/12/13 0600  WBC 7.2 7.8  HGB 13.5 11.9*  HCT 39.4 35.8*  PLT 160 126*   BMET  Recent Labs  02/11/13 0625 02/12/13 0600  NA 144 140  K 3.8 3.9  CL 106 105  CO2 25 25  GLUCOSE 103* 109*  BUN 19 14  CREATININE 1.30 1.17  CALCIUM 8.9 8.8   PT/INR No results found for this basename: LABPROT, INR,  in the last 72 hours ABG No results found for this basename: PHART, PCO2, PO2, HCO3,  in the last 72 hours  Studies/Results: Dg Abd 1 View  02/12/2013   CLINICAL DATA:  Obstruction  EXAM: ABDOMEN - 1 VIEW  COMPARISON:  02/11/2013  FINDINGS: A nasogastric catheter is again seen within the stomach. Dilated small bowel remains. There remains contrast material in the left colon. No free air is seen. Degenerative changes of the lumbar spine are noted.  IMPRESSION: Stable appearing small bowel dilatation.   Electronically Signed   By: Alcide Clever M.D.   On: 02/12/2013 16:56    Anti-infectives: Anti-infectives   None      Assessment/Plan: SBO - improving, D/C NGT and start clears  LOS: 4 days    Brendan Gruwell E 02/13/2013

## 2013-02-13 NOTE — Progress Notes (Signed)
Heart rate began to climb after midnight, up to 115-125 at rest, 140's when up to BR. Pt denies symptoms.  He is having pain now in both great toes.  Dr. Evlyn Kanner notified of increased HR and pt not on any of his cardiac meds.  Order received for increased IV fluids until 0700.  Will continue to monitor.

## 2013-02-13 NOTE — Progress Notes (Signed)
Pt complains of pain in right great toe.  Redness and swelling noted. He states that this is gout.

## 2013-02-13 NOTE — Progress Notes (Signed)
UR Completed.  Karl Stevenson [name] [phone] 02/13/2013  

## 2013-02-13 NOTE — Progress Notes (Signed)
Subjective: Not having abdominal pain and having small bms. Having gout pain in both great toes, no dyspnea or chest discomfort  Objective: Vital signs in last 24 hours: Temp:  [98.3 F (36.8 C)-98.6 F (37 C)] 98.3 F (36.8 C) (10/06 0522) Pulse Rate:  [90-114] 114 (10/06 0522) Resp:  [16] 16 (10/06 0522) BP: (93-145)/(60-92) 93/60 mmHg (10/06 0522) SpO2:  [96 %-99 %] 96 % (10/06 0522) Weight:  [58.151 kg (128 lb 3.2 oz)] 58.151 kg (128 lb 3.2 oz) (10/06 0522) Weight change: -0.408 kg (-14.4 oz)   Intake/Output from previous day: 10/05 0701 - 10/06 0700 In: 2050 [I.V.:2050] Out: 150    General appearance: alert, cooperative and no distress Resp: clear to auscultation bilaterally Cardio: regular rate and rhythm GI: mild abdominal distension, decreased bowel sounds, no tenderness Extremities: no edema, mild erythema without tenderness bilateral 1st MTP joints  Lab Results:  Recent Labs  02/11/13 0625 02/12/13 0600  WBC 7.2 7.8  HGB 13.5 11.9*  HCT 39.4 35.8*  PLT 160 126*   BMET  Recent Labs  02/11/13 0625 02/12/13 0600  NA 144 140  K 3.8 3.9  CL 106 105  CO2 25 25  GLUCOSE 103* 109*  BUN 19 14  CREATININE 1.30 1.17  CALCIUM 8.9 8.8   CMET CMP     Component Value Date/Time   NA 140 02/12/2013 0600   K 3.9 02/12/2013 0600   CL 105 02/12/2013 0600   CO2 25 02/12/2013 0600   GLUCOSE 109* 02/12/2013 0600   BUN 14 02/12/2013 0600   CREATININE 1.17 02/12/2013 0600   CALCIUM 8.8 02/12/2013 0600   PROT 6.3 02/12/2013 0600   ALBUMIN 3.3* 02/12/2013 0600   AST 17 02/12/2013 0600   ALT 13 02/12/2013 0600   ALKPHOS 67 02/12/2013 0600   BILITOT 0.5 02/12/2013 0600   GFRNONAA 56* 02/12/2013 0600   GFRAA 65* 02/12/2013 0600    CBG (last 3)   Recent Labs  02/12/13 1605 02/12/13 2200 02/13/13 0647  GLUCAP 107* 104* 127*    INR RESULTS:   Lab Results  Component Value Date   INR 1.05 07/27/2012   INR 1.03 08/12/2011   INR 1.03 06/26/2011      Studies/Results: Dg Abd 1 View  02/12/2013   CLINICAL DATA:  Obstruction  EXAM: ABDOMEN - 1 VIEW  COMPARISON:  02/11/2013  FINDINGS: A nasogastric catheter is again seen within the stomach. Dilated small bowel remains. There remains contrast material in the left colon. No free air is seen. Degenerative changes of the lumbar spine are noted.  IMPRESSION: Stable appearing small bowel dilatation.   Electronically Signed   By: Alcide Clever M.D.   On: 02/12/2013 16:56    Medications: I have reviewed the patient's current medications.  Assessment/Plan: #1 SBO: clinically improving, starting clears today, will give MOM and dulcolax to clear distally #2 CAD: stable #3 Gout: will check serum uric acid level and consider starting colchicine   LOS: 4 days   Kaira Stringfield G 02/13/2013, 8:42 AM

## 2013-02-14 ENCOUNTER — Inpatient Hospital Stay (HOSPITAL_COMMUNITY): Payer: Medicare PPO

## 2013-02-14 DIAGNOSIS — R109 Unspecified abdominal pain: Secondary | ICD-10-CM | POA: Diagnosis present

## 2013-02-14 MED ORDER — AMLODIPINE BESYLATE 5 MG PO TABS
5.0000 mg | ORAL_TABLET | Freq: Every day | ORAL | Status: DC
  Administered 2013-02-14: 5 mg via ORAL
  Filled 2013-02-14: qty 1

## 2013-02-14 MED ORDER — RANOLAZINE ER 500 MG PO TB12
500.0000 mg | ORAL_TABLET | Freq: Two times a day (BID) | ORAL | Status: DC
  Administered 2013-02-14: 500 mg via ORAL
  Filled 2013-02-14 (×2): qty 1

## 2013-02-14 MED ORDER — PANTOPRAZOLE SODIUM 40 MG PO TBEC
40.0000 mg | DELAYED_RELEASE_TABLET | Freq: Every day | ORAL | Status: DC
  Administered 2013-02-14: 40 mg via ORAL
  Filled 2013-02-14: qty 1

## 2013-02-14 MED ORDER — PREDNISONE 10 MG PO TABS: ORAL_TABLET | ORAL | Status: DC

## 2013-02-14 NOTE — Progress Notes (Signed)
Subjective: Tolerated clear liquids well, had some loose BMs yesterday, and today has no abdominal pain or nausea  Objective: Vital signs in last 24 hours: Temp:  [97.5 F (36.4 C)-97.6 F (36.4 C)] 97.6 F (36.4 C) (10/07 0414) Pulse Rate:  [68-94] 68 (10/07 0414) Resp:  [18] 18 (10/07 0414) BP: (109-136)/(58-79) 109/58 mmHg (10/07 0414) SpO2:  [97 %-99 %] 98 % (10/07 0414) Weight change:    Intake/Output from previous day: 10/06 0701 - 10/07 0700 In: 960 [P.O.:960] Out: -    General appearance: alert, cooperative and no distress Resp: clear to auscultation bilaterally Cardio: regular rate and rhythm GI: mild abdomen distension, normal bowel sounds and no tenderness Extremities: extremities normal, atraumatic, no cyanosis or edema  Lab Results:  Recent Labs  02/12/13 0600  WBC 7.8  HGB 11.9*  HCT 35.8*  PLT 126*   BMET  Recent Labs  02/12/13 0600  NA 140  K 3.9  CL 105  CO2 25  GLUCOSE 109*  BUN 14  CREATININE 1.17  CALCIUM 8.8   CMET CMP     Component Value Date/Time   NA 140 02/12/2013 0600   K 3.9 02/12/2013 0600   CL 105 02/12/2013 0600   CO2 25 02/12/2013 0600   GLUCOSE 109* 02/12/2013 0600   BUN 14 02/12/2013 0600   CREATININE 1.17 02/12/2013 0600   CALCIUM 8.8 02/12/2013 0600   PROT 6.3 02/12/2013 0600   ALBUMIN 3.3* 02/12/2013 0600   AST 17 02/12/2013 0600   ALT 13 02/12/2013 0600   ALKPHOS 67 02/12/2013 0600   BILITOT 0.5 02/12/2013 0600   GFRNONAA 56* 02/12/2013 0600   GFRAA 65* 02/12/2013 0600    CBG (last 3)   Recent Labs  02/12/13 1605 02/12/13 2200 02/13/13 0647  GLUCAP 107* 104* 127*    INR RESULTS:   Lab Results  Component Value Date   INR 1.05 07/27/2012   INR 1.03 08/12/2011   INR 1.03 06/26/2011     Studies/Results: Dg Abd 1 View  02/12/2013   CLINICAL DATA:  Obstruction  EXAM: ABDOMEN - 1 VIEW  COMPARISON:  02/11/2013  FINDINGS: A nasogastric catheter is again seen within the stomach. Dilated small bowel remains. There  remains contrast material in the left colon. No free air is seen. Degenerative changes of the lumbar spine are noted.  IMPRESSION: Stable appearing small bowel dilatation.   Electronically Signed   By: Alcide Clever M.D.   On: 02/12/2013 16:56    Medications: I have reviewed the patient's current medications.  Assessment/Plan: #1 SBO:  Doing well after steroids added for possible inflammatory component of SBO. Will advance to a regular diet today and if well tolerated will discharge tomorrow, repeat abdomen X-rays today. #2 CAD: stable on current meds.   LOS: 5 days   Dailyn Reith G 02/14/2013, 7:59 AM

## 2013-02-14 NOTE — Progress Notes (Signed)
Patient ID: Karl Stevenson, male   DOB: December 04, 1926, 79 y.o.   MRN: [id number]    Subjective: Pt feels well today.  No abdominal pain.  + flatus and BM.  Tolerating clear liquids  Objective: Vital signs in last 24 hours: Temp:  [97.5 F (36.4 C)-97.6 F (36.4 C)] 97.6 F (36.4 C) (10/07 0414) Pulse Rate:  [68-94] 68 (10/07 0414) Resp:  [18] 18 (10/07 0414) BP: (109-136)/(58-79) 109/58 mmHg (10/07 0414) SpO2:  [97 %-99 %] 98 % (10/07 0414) Last BM Date: 02/12/13  Intake/Output from previous day: 10/06 0701 - 10/07 0700 In: 960 [P.O.:960] Out: -  Intake/Output this shift:    PE: Abd: soft, NT, ND, +BS  Lab Results:   Recent Labs  02/12/13 0600  WBC 7.8  HGB 11.9*  HCT 35.8*  PLT 126*   BMET  Recent Labs  02/12/13 0600  NA 140  K 3.9  CL 105  CO2 25  GLUCOSE 109*  BUN 14  CREATININE 1.17  CALCIUM 8.8   PT/INR No results found for this basename: LABPROT, INR,  in the last 72 hours CMP     Component Value Date/Time   NA 140 02/12/2013 0600   K 3.9 02/12/2013 0600   CL 105 02/12/2013 0600   CO2 25 02/12/2013 0600   GLUCOSE 109* 02/12/2013 0600   BUN 14 02/12/2013 0600   CREATININE 1.17 02/12/2013 0600   CALCIUM 8.8 02/12/2013 0600   PROT 6.3 02/12/2013 0600   ALBUMIN 3.3* 02/12/2013 0600   AST 17 02/12/2013 0600   ALT 13 02/12/2013 0600   ALKPHOS 67 02/12/2013 0600   BILITOT 0.5 02/12/2013 0600   GFRNONAA 56* 02/12/2013 0600   GFRAA 65* 02/12/2013 0600   Lipase     Component Value Date/Time   LIPASE 11 02/09/2013 2330       Studies/Results: Dg Abd 1 View  02/12/2013   CLINICAL DATA:  Obstruction  EXAM: ABDOMEN - 1 VIEW  COMPARISON:  02/11/2013  FINDINGS: A nasogastric catheter is again seen within the stomach. Dilated small bowel remains. There remains contrast material in the left colon. No free air is seen. Degenerative changes of the lumbar spine are noted.  IMPRESSION: Stable appearing small bowel dilatation.   Electronically Signed   By: Alcide Clever  M.D.   On: 02/12/2013 16:56    Anti-infectives: Anti-infectives   None       Assessment/Plan  1. SBO, improving  Plan: 1. Ok to advance diet today.  Primary team has already advanced to a regular diet.  Will follow.   LOS: 5 days    Nazia Rhines E 02/14/2013, 8:14 AM Pager: [phone]

## 2013-02-14 NOTE — Progress Notes (Signed)
Up in chair, tol reg diet. Hope to D/C in AM. Had BM. Patient examined and I agree with the assessment and plan  Violeta Gelinas, MD, MPH, FACS Pager: [phone]  02/14/2013 1:46 PM

## 2013-02-14 NOTE — Progress Notes (Signed)
Assessment unchanged. Discussed D/C instructions with pt and wife including f/u appointments and new medication. Pt verbalized understanding. IV and tele removed. Pt left via W/C accompanied by NT with belongings.

## 2013-02-14 NOTE — Discharge Summary (Addendum)
Physician Discharge Summary  Patient ID: Karl Stevenson MRN: [id number] DOB/AGE: Nov 30, 1926 79 y.o.  Admit date: 02/09/2013 Discharge date: 02/14/2013   Discharge Diagnoses:  Principal Problem:   SBO (small bowel obstruction) Active Problems:   Abdominal pain, unspecified site   Crohn's disease   CAD (coronary artery disease)   HTN (hypertension)   CKD (chronic kidney disease), stage III   Discharged Condition: good  Hospital Course:   The patient is an 79 year old Caucasian man  Who is well known to me as I serve  As his primary care physician.  He was initially usual state of good health until the morning prior to admission when he started to develop mild lower abdominal pain.  He had a normal breakfast and lunch time only had soup.  Throughout the afternoon in his lower abdominal crampy pain progressed and became associated with nausea without vomiting.  Previously his bowel movements have been normal with last bowel movement on the morning prior to hospital admission.   He had not had any recent fever, chills, shortness of breath, chest pain, dysuria, or frequency.  A workup in the emergency room included a CT scan of the abdomen and pelvis that showed a partial small bowel obstruction with possible stricture at the small bowel anastomosis.  He was treated with IV fluids and IV antibiotics and antiemetics.  After several attempts at placing a gastric tube this was done by interventional radiology without difficulty.  He continued on IV fluids and IV antiemetics with bowel rest and did well. Moderate dose  IV Solu-Medrol was also added which was associated with rapid improvement in his bowel distention and discomfort.  By the day of discharge his abdomen x-rays had normalized and he was eating regular food without any difficulty.  He was having normal bowel movements as well and was without fever or chills.  He was followed closely by a consultant from general surgery as well. The only  procedures during his hospitalization were a CT scan of the abdomen and pelvis with IV contrast and fluoroscopically guided gastric tube placement.  There were no complications.  Consults: general surgery  Significant Diagnostic Studies:  Dg Abd 1 View  02/14/2013   CLINICAL DATA:  Evaluate small bowel obstruction. Abdominal discomfort.  EXAM: ABDOMEN - 1 VIEW  COMPARISON:  02/12/2013  FINDINGS: Interval removal of nasogastric tube. Convex right lumbar spine curvature. Small bowel dilatation is improved to resolved. Passage of the majority of the contrast from the descending colon. No pneumatosis or free intraperitoneal air.  IMPRESSION: Markedly improved or resolved small bowel dilatation. No acute findings.   Electronically Signed   By: Jeronimo Greaves M.D.   On: 02/14/2013 09:24   Dg Abd 1 View  02/12/2013   CLINICAL DATA:  Obstruction  EXAM: ABDOMEN - 1 VIEW  COMPARISON:  02/11/2013  FINDINGS: A nasogastric catheter is again seen within the stomach. Dilated small bowel remains. There remains contrast material in the left colon. No free air is seen. Degenerative changes of the lumbar spine are noted.  IMPRESSION: Stable appearing small bowel dilatation.   Electronically Signed   By: Alcide Clever M.D.   On: 02/12/2013 16:56    Labs: Lab Results  Component Value Date   WBC 7.8 02/12/2013   HGB 11.9* 02/12/2013   HCT 35.8* 02/12/2013   MCV 98.4 02/12/2013   PLT 126* 02/12/2013      Recent Labs Lab 02/12/13 0600  NA 140  K 3.9  CL 105  CO2 25  BUN 14  CREATININE 1.17  CALCIUM 8.8  PROT 6.3  BILITOT 0.5  ALKPHOS 67  ALT 13  AST 17  GLUCOSE 109*       Lab Results  Component Value Date   INR 1.05 07/27/2012   INR 1.03 08/12/2011   INR 1.03 06/26/2011     No results found for this or any previous visit (from the past 240 hour(s)).    Discharge Exam: Blood pressure 116/69, pulse 71, temperature 97.5 F (36.4 C), temperature source Oral, resp. rate 17, height 5\' 7"  (1.702 m), weight  58.151 kg (128 lb 3.2 oz), SpO2 99.00%.  Physical Exam: In general, the patient is an elderly white man who was in no apparent distress while sitting in a chair.  HEENT exam was within normal limits, neck was without jugular venous distention, chest was clear to auscultation, heart had a regular rate and rhythm, abdomen had normal bowel sounds and no distention or tenderness, extremities were without cyanosis, clubbing, or edema.  He was alert and well oriented with a normal affect and able to move all extremities well and able to ambulate is room without difficulty.  Disposition:he will be discharged from the hospital to go home in the company of his wife.  Daily new medication added is prednisone for what could be an inflammatory component of his obstruction.  He will resume a regular diet and should have a follow-up visit with Dr. Jarold Motto within 1 week of hospital discharge.      Discharge Orders   Future Appointments Provider Department Dept Phone   06/23/2013 9:30 AM [facility] [place] [facility] [address] [phone]   06/23/2013 10:00 AM [facility] [place] [facility] [address] [phone]   06/23/2013 10:30 AM [facility] [place] [facility] [address] [phone]   06/23/2013 11:30 AM [name], MD [facility] [phone]   Future Orders Complete By Expires   Call MD for:  As directed    Comments:     Call physician as soon as possible if you develop fever, chills, recurrent nausea, vomiting, diarrhea, weakness, difficulty breathing, chest pain, or any other concerning symptoms.   Diet - low sodium heart healthy  As directed    Discharge instructions  As directed    Comments:     You'll be discharged to home today and can continue to have normal food. Call if you have any problems at home.   Increase activity slowly  As directed        Medication List         acetaminophen 500 MG tablet  Commonly known as:   TYLENOL  Take 1,000 mg by mouth every 6 (six) hours as needed for pain.     amLODipine 5 MG tablet  Commonly known as:  NORVASC  Take 5 mg by mouth daily.     aspirin 81 MG tablet  Take 81 mg by mouth daily.     atorvastatin 10 MG tablet  Commonly known as:  LIPITOR  Take 10 mg by mouth at bedtime.     clopidogrel 75 MG tablet  Commonly known as:  PLAVIX  Take 75 mg by mouth daily.     COLCRYS 0.6 MG tablet  Generic drug:  colchicine  Take 0.6 mg by mouth daily as needed (gout).     fexofenadine 180 MG tablet  Commonly known as:  ALLEGRA  Take 180 mg by mouth daily as needed (  seasonal allergies).     ipratropium 0.03 % nasal spray  Commonly known as:  ATROVENT  Place 1 spray into the nose daily.     isosorbide mononitrate 60 MG 24 hr tablet  Commonly known as:  IMDUR  Take 1 tablet (60 mg total) by mouth 2 (two) times daily.     losartan 100 MG tablet  Commonly known as:  COZAAR  Take 1 tablet (100 mg total) by mouth daily.     MULTIVITAMIN PO  Take 1 tablet by mouth daily.     nitroGLYCERIN 0.4 MG SL tablet  Commonly known as:  NITROSTAT  Place 0.4 mg under the tongue every 5 (five) minutes as needed for chest pain.     pantoprazole 40 MG tablet  Commonly known as:  PROTONIX  Take 1 tablet (40 mg total) by mouth daily.     predniSONE 10 MG tablet  Commonly known as:  DELTASONE  Take 3 tabs by mouth daily for 2 days, then 2 tabs daily for 5 days, then 1 tab daily for 4 days     propranolol 10 MG tablet  Commonly known as:  INDERAL  Take 1 tablet as needed for heart rate >110     ranolazine 500 MG 12 hr tablet  Commonly known as:  RANEXA  Take 1 tablet (500 mg total) by mouth 2 (two) times daily.     vitamin B-12 100 MCG tablet  Commonly known as:  CYANOCOBALAMIN  Take 50 mcg by mouth daily.       Follow-up Information   Follow up with Garlan Fillers, MD. Schedule an appointment as soon as possible for a visit in 1 week.   Specialty:  Internal  Medicine   Contact information:   [address] [facility] [place] [address] [phone]       Signed: Garlan Fillers 02/14/2013, 4:41 PM

## 2013-02-15 ENCOUNTER — Telehealth: Payer: Self-pay | Admitting: Cardiovascular Disease

## 2013-02-15 NOTE — Telephone Encounter (Deleted)
error 

## 2013-03-16 ENCOUNTER — Other Ambulatory Visit: Payer: Self-pay

## 2013-03-16 ENCOUNTER — Telehealth: Payer: Self-pay | Admitting: *Deleted

## 2013-03-16 NOTE — Telephone Encounter (Signed)
S/w pt wanted samples of Ranexa found out we will have samples tomorrow and we have a new program for ranexa and low income pt that could get medications for up to 6.00 will call pt back tomorrow

## 2013-03-16 NOTE — Telephone Encounter (Signed)
patient called in to talk to Daphene Calamity

## 2013-03-17 NOTE — Telephone Encounter (Signed)
S/w pt about several ranexa low income plans stated doesn't qualify stated suppose to get samples in of ranexa and I will keep my out eye for any and I will contact pt. Pt was appreciative

## 2013-03-24 ENCOUNTER — Telehealth: Payer: Self-pay | Admitting: *Deleted

## 2013-03-24 NOTE — Telephone Encounter (Signed)
S/w pt leaving ranexa samples up front for pt, pt was thankful

## 2013-04-03 ENCOUNTER — Other Ambulatory Visit: Payer: Self-pay | Admitting: Gastroenterology

## 2013-04-17 ENCOUNTER — Other Ambulatory Visit: Payer: Self-pay | Admitting: *Deleted

## 2013-04-17 ENCOUNTER — Other Ambulatory Visit: Payer: Self-pay | Admitting: Nurse Practitioner

## 2013-04-18 ENCOUNTER — Telehealth: Payer: Self-pay | Admitting: Cardiovascular Disease

## 2013-04-18 NOTE — Telephone Encounter (Signed)
I spoke with the pt and made him aware that he does not need to discontinue Pantoprazole. The pt said he read this information on the drug information that came with his refill today.  I made the pt aware that we normally have pt's take a GERD medication while taking plavix.  The pt will continue his current medical regimen.

## 2013-04-18 NOTE — Telephone Encounter (Signed)
New message  Patient would like to speak with you regarding medication Panpoprazole. He read that you should not be on this medication for more than 12 months. He is very concerned since he's been taking this medication for years. Please call patient and advise.

## 2013-06-06 ENCOUNTER — Ambulatory Visit (INDEPENDENT_AMBULATORY_CARE_PROVIDER_SITE_OTHER): Payer: Medicare PPO | Admitting: Cardiovascular Disease

## 2013-06-06 ENCOUNTER — Encounter: Payer: Self-pay | Admitting: Cardiovascular Disease

## 2013-06-06 VITALS — BP 144/69 | HR 71 | Ht 67.0 in | Wt 136.0 lb

## 2013-06-06 DIAGNOSIS — I2510 Atherosclerotic heart disease of native coronary artery without angina pectoris: Secondary | ICD-10-CM

## 2013-06-06 NOTE — Progress Notes (Signed)
HPI:  80 year old gentleman presenting for followup evaluation. The patient has coronary artery disease and has undergone 2 previous bypass surgeries, first in 1987 and redo surgery in 2008. His last cardiac catheterization in 2014 and demonstrated patency of the vein graft sequence to the obtuse marginal branches of the circumflex, vein graft to LAD, and vein graft to OM 3. He had mild to moderate left subclavian stenosis and a known atretic LIMA graft. His left ventricular ejection fraction was estimated at 40%. He has a small infrarenal abdominal aortic aneurysm. The patient has extensive peripheral arterial disease and has undergone carotid endarterectomy as well as lower extremity revascularization surgeries by Dr. Imogene Burn.   He was hospitalized this past fall with a small bowel obstruction. This responded well to conservative therapy. He's had no cardiac complaints over the last several months. He denies chest pain, palpitations, dyspnea, orthopnea, or PND. He has mild leg swelling ever since his last vascular surgery. He does have occasional perception of tachycardia and he takes propranolol as needed. This generally occurs in the early morning after he first gets up out of bed. He's had no lightheadedness or presyncope.  Outpatient Encounter Prescriptions as of 06/06/2013  Medication Sig  . acetaminophen (TYLENOL) 500 MG tablet Take 1,000 mg by mouth every 6 (six) hours as needed for pain.  [name] amLODipine (NORVASC) 5 MG tablet Take 5 mg by mouth daily.  [name] aspirin 81 MG tablet Take 81 mg by mouth daily.   [name] atorvastatin (LIPITOR) 10 MG tablet Take 10 mg by mouth at bedtime.   . clopidogrel (PLAVIX) 75 MG tablet Take 75 mg by mouth daily.  [name] COLCRYS 0.6 MG tablet Take 0.6 mg by mouth daily as needed (gout).   . fexofenadine (ALLEGRA) 180 MG tablet Take 180 mg by mouth daily as needed (seasonal allergies).   [name] ipratropium (ATROVENT) 0.03 % nasal spray Place 1 spray into the nose daily.   .  isosorbide mononitrate (IMDUR) 60 MG 24 hr tablet Take 1 tablet (60 mg total) by mouth 2 (two) times daily.  [name] losartan (COZAAR) 100 MG tablet Take 1 tablet (100 mg total) by mouth daily.  . Multiple Vitamins-Minerals (MULTIVITAMIN PO) Take 1 tablet by mouth daily.  . nitroGLYCERIN (NITROSTAT) 0.4 MG SL tablet Place 0.4 mg under the tongue every 5 (five) minutes as needed for chest pain.  . pantoprazole (PROTONIX) 40 MG tablet TAKE ONE TABLET BY MOUTH EVERY DAY  . propranolol (INDERAL) 10 MG tablet Take 1 tablet as needed for heart rate >110  . ranolazine (RANEXA) 500 MG 12 hr tablet Take 1 tablet (500 mg total) by mouth 2 (two) times daily.  . vitamin B-12 (CYANOCOBALAMIN) 100 MCG tablet Take 50 mcg by mouth daily.  . [DISCONTINUED] predniSONE (DELTASONE) 10 MG tablet Take 3 tabs by mouth daily for 2 days, then 2 tabs daily for 5 days, then 1 tab daily for 4 days    Allergies  Allergen Reactions  . Novocain [Procaine Hcl] Other (See Comments)    Cold sweats and very bad headache.   . Metoprolol Hives, Itching and Rash    Past Medical History  Diagnosis Date  . Hyperlipidemia   . Hypertension   . Carotid artery occlusion     s/p L CEA in 2010  . Crohn's disease     colon resection  . Bradycardia     previous intolerance to beta blockers - low dose toprol started 10/2011 for tachycardia but had rash and was  discontinued  . Chronic systolic heart failure     echo 7/12: EF 35-40%, anteroseptal hypokinesis, mild MR.  . Ischemic cardiomyopathy     EF 34% (Lexiscan 06/2011), 35-40% (echo 11/2010)  . Coronary artery disease     CABG 1987, redo 2008; Last LHC 1/10: SVG-OM 1/OM 2 patent, SVG-LAD patent, SVG-OM 3 Patent  . Peripheral vascular disease     Carotid dz s/p L CEA, RAS, AAA, LE dz  . Renal artery stenosis     Dopplers 12/2010 - 1-59% R ostial renal artery stenosis, 2 L renal arteries both widely patent  . Gouty arthritis   . AAA (abdominal aortic aneurysm)     Ectatic abdominal  aorta with fusiform dilitation 3.4x3.4 and moderate thrombus burden 12/2010  . RBBB   . Tachycardia     a. 10/2011 - atrial tach vs avnrt - BB started.  . Myocardial infarction   . COPD (chronic obstructive pulmonary disease)     ROS: Negative except as per HPI  BP 144/69  Pulse 71  Ht 5\' 7"  (1.702 m)  Wt 136 lb (61.689 kg)  BMI 21.30 kg/m2  PHYSICAL EXAM: Pt is alert and oriented, pleasant elderly male in NAD HEENT: normal Neck: JVP - normal, carotids 2+= with bilateral bruits Lungs: CTA bilaterally CV: RRR without murmur or gallop Abd: soft, NT, Positive BS, no hepatomegaly Ext: 1+ pretibial edema on the right, trace on the left Skin: warm/dry no rash  Cardiac Cath 07/27/2012: Procedural Findings:  Hemodynamics:  AO 116/52  LV 118/8  Coronary angiography:  Coronary dominance: right  Left mainstem: Severely diseased with 95% stenosis.  Left anterior descending (LAD): Total occlusion of the proximal vessel.  Left circumflex (LCx): Total occlusion of the proximal vessel.  Right coronary artery (RCA): Small, nondominant vessel. No obstructive disease noted  Saphenous vein graft to first OM: The graft is patent. There is 60% smooth stenosis in the distal body of the graft. The target vessel is small.  Y. graft to mid LAD arising from body of the vein graft to OM: This graft is widely patent with no significant areas of disease. The LAD is extremely small in caliber and diffusely diseased. The apical LAD has 95% stenosis. The first diagonal is moderate in caliber and fills retrograde from the LAD graft. There is 70% stenosis in the diagonal.  Sequential vein graft to OM 2 and left PDA: Widely patent without significant stenosis. Both target vessels are large in caliber and have no significant disease.  The LIMA to LAD is known to be atretic and it was not injected today.  Left ventriculography: There is severe hypokinesis of the mid and distal anterior walls. There is akinesis of  the true apex. There is akinesis of the infero-apex. The basal and midinferior walls in the basal anterior wall contract normally. The estimated left ventricular ejection fraction is 40%.  Final Conclusions:  1. Severe native three-vessel coronary artery disease  2. Continued patency of the saphenous vein graft to first OM and saphenous vein graft to LAD and saphenous vein graft sequence to OM 2 and left PDA  3. Moderate segmental left ventricular systolic dysfunction.  Recommendations: The patient's coronary anatomy is stable. I compared his films to his previous study from 2010. I would recommend continued medical therapy.   ASSESSMENT AND PLAN: 1. Coronary artery disease. Stable coronary anatomy at cardiac catheterization last year. He will continue on aspirin, Plavix, amlodipine, and isosorbide. He's been bradycardic with scheduled beta blockade. He  will continue to use propranolol as needed.  2. Hyperlipidemia. The patient takes atorvastatin. Lipids are followed by his primary physician.  3. Essential hypertension. Blood pressure is in a stable range. He will continue his home regimen.  4. Bifascicular block. Stable with no symptoms of lightheadedness or presyncope.  5. Peripheral arterial disease. Followed by Dr. Imogene Burn with vascular surgery  For followup he will see Norma Fredrickson in 4 months and we will continue to alternate his appointments.  [name] 06/06/2013 3:45 PM

## 2013-06-06 NOTE — Patient Instructions (Signed)
Your physician recommends that you continue on your current medications as directed. Please refer to the Current Medication list given to you today.  Your physician recommends that you schedule a follow-up appointment in: 4 months with Norma Fredrickson, NP

## 2013-06-22 ENCOUNTER — Encounter: Payer: Self-pay | Admitting: Vascular Surgery

## 2013-06-23 ENCOUNTER — Encounter: Payer: Self-pay | Admitting: Vascular Surgery

## 2013-06-23 ENCOUNTER — Ambulatory Visit (INDEPENDENT_AMBULATORY_CARE_PROVIDER_SITE_OTHER): Payer: Medicare PPO | Admitting: Vascular Surgery

## 2013-06-23 ENCOUNTER — Ambulatory Visit (INDEPENDENT_AMBULATORY_CARE_PROVIDER_SITE_OTHER)
Admission: RE | Admit: 2013-06-23 | Discharge: 2013-06-23 | Disposition: A | Discharge status: Home / Self Care | Payer: Medicare PPO | Source: Ambulatory Visit | Attending: Neurosurgery | Admitting: Neurosurgery

## 2013-06-23 ENCOUNTER — Ambulatory Visit (HOSPITAL_COMMUNITY)
Admission: RE | Admit: 2013-06-23 | Discharge: 2013-06-23 | Disposition: A | Discharge status: Home / Self Care | Payer: Medicare PPO | Source: Ambulatory Visit | Attending: Vascular Surgery | Admitting: Vascular Surgery

## 2013-06-23 VITALS — BP 141/71 | HR 65 | Ht 67.0 in | Wt 135.0 lb

## 2013-06-23 DIAGNOSIS — E785 Hyperlipidemia, unspecified: Secondary | ICD-10-CM | POA: Insufficient documentation

## 2013-06-23 DIAGNOSIS — Z48812 Encounter for surgical aftercare following surgery on the circulatory system: Secondary | ICD-10-CM

## 2013-06-23 DIAGNOSIS — Z9889 Other specified postprocedural states: Secondary | ICD-10-CM | POA: Insufficient documentation

## 2013-06-23 DIAGNOSIS — I739 Peripheral vascular disease, unspecified: Secondary | ICD-10-CM

## 2013-06-23 DIAGNOSIS — I6529 Occlusion and stenosis of unspecified carotid artery: Secondary | ICD-10-CM

## 2013-06-23 DIAGNOSIS — I10 Essential (primary) hypertension: Secondary | ICD-10-CM | POA: Insufficient documentation

## 2013-06-23 DIAGNOSIS — I70229 Atherosclerosis of native arteries of extremities with rest pain, unspecified extremity: Secondary | ICD-10-CM | POA: Insufficient documentation

## 2013-06-23 NOTE — Progress Notes (Signed)
    History of Present Illness  Karl Stevenson is a 80 y.o. male (30-Apr-1927) who presents with chief complaint: none. Previous operation(s) include: Bilateral iliofemoral endarterectomies with patch angioplasty (Date: R 08/26/11, L 07/01/11), L CEA by Dr. Madilyn Fireman (11/20/08). The patient's symptoms have resolved. The prior R inguinal seroma has resolved.  The patient's treatment regimen currently included: maximal medical management. Pt complains of RIH, now asx.  Past Medical History, Past Surgical History, Social History, Family History, Medications, Allergies, and Review of Systems are unchanged from previous evaluation on 09/02/12  On ROS: no intermittent claudication, no rest pain, no CVA sx  Physical Examination  Filed Vitals:   06/23/13 1117 06/23/13 1121  BP: 139/77 141/71  Pulse: 65   Height: 5\' 7"  (1.702 m)   Weight: 135 lb (61.236 kg)   SpO2: 99%    Body mass index is 21.14 kg/(m^2).  General: A&O x 3, elderly , thin  Pulmonary: Sym exp, good air movt, CTAB, no rales, rhonchi, & wheezing   Cardiac: RRR, Nl S1, S2, no Murmurs, rubs or gallops   Vascular:  Vessel  Right  Left   Radial  Palpable  Palpable   Brachial  Palpable  Palpable   Carotid  Palpable, without bruit  Palpable, without bruit   Aorta  Non-palpable  N/A   Femoral  Palpable  Palpable   Popliteal  Non-palpable  Non-palpable   PT  Palpable  Palpable   DP  Palpable  Palpable    Musculoskeletal: M/S 5/5 throughout , Extremities without ischemic changes , no mass in R groin, inc well healed, likely RIH hernia  Neurologic: Pain and light touch intact in extremities , Motor exam as listed above   Non-Invasive Vascular Imaging  ABI (Date: 06/23/2013)  R: 1.16 (1.14), DP: tri, PT: bi, TBI: 0.66  L: 1.07 (0.97), DP: tri, PT: tri, TBI: 0.72  BLE Arterial Duplex (Date: 06/23/2013)  Endarterectomy site widely patent B Normal arterial structure throughout Prior cystic structure in right groin not visualized  today  Carotid Duplex (Date: 06/23/2013):   R ICA stenosis: <40%  R VA:  patent and antegrade  L ICA stenosis: patent CEA  L VA: patent and antegrade  Medical Decision Making  Karl Stevenson is a 80 y.o. male who presents with: s/p B iliofemoral endarterectomy with bovine patch angioplasty for femoral artery occlusion , s/p L CEA, Likely RIH R seroma is resolved. Pt's sx suggestive of RIH.  No evidence of incarceration currently.  I don't see any reason to immediate proceed with [facility] at this point. Based on the patient's vascular studies and examination, I have offered the patient: continued surveillance q12 months: BLE ABI, B carotid duplex I discussed in depth with the patient the nature of atherosclerosis, and emphasized the importance of maximal medical management including strict control of blood pressure, blood glucose, and lipid levels, obtaining regular exercise, and cessation of smoking. The patient is aware that without maximal medical management the underlying atherosclerotic disease process will progress, limiting the benefit of any interventions.  I discussed in depth with the patient the need for long term surveillance to improve the primary assisted patency of his bypass. The patient agrees to cooperate with such.  Thank you for allowing [place] to participate in this patient's care.  Karl Sake, MD [facility] [place] Office: [phone] Pager: [phone]  06/23/2013, 11:43 AM

## 2013-06-26 NOTE — Addendum Note (Signed)
Addended by: Adria Dill L on: 06/26/2013 04:28 PM   Modules accepted: Orders

## 2013-09-05 ENCOUNTER — Other Ambulatory Visit: Payer: Self-pay

## 2013-09-05 DIAGNOSIS — I2510 Atherosclerotic heart disease of native coronary artery without angina pectoris: Secondary | ICD-10-CM

## 2013-09-05 MED ORDER — ISOSORBIDE MONONITRATE ER 60 MG PO TB24: 60.0000 mg | ORAL_TABLET | Freq: Two times a day (BID) | ORAL | Status: DC

## 2013-10-10 ENCOUNTER — Other Ambulatory Visit: Payer: Self-pay | Admitting: Nurse Practitioner

## 2013-10-23 ENCOUNTER — Encounter: Payer: Self-pay | Admitting: Podiatry

## 2013-10-23 ENCOUNTER — Ambulatory Visit (INDEPENDENT_AMBULATORY_CARE_PROVIDER_SITE_OTHER): Payer: Medicare PPO | Admitting: Podiatry

## 2013-10-23 VITALS — BP 140/74 | HR 65 | Resp 14 | Ht 67.0 in | Wt 135.0 lb

## 2013-10-23 DIAGNOSIS — IMO0001 Reserved for inherently not codable concepts without codable children: Secondary | ICD-10-CM

## 2013-10-23 DIAGNOSIS — B351 Tinea unguium: Secondary | ICD-10-CM

## 2013-10-23 DIAGNOSIS — M79609 Pain in unspecified limb: Secondary | ICD-10-CM

## 2013-10-23 NOTE — Progress Notes (Signed)
   Subjective:    Patient ID: Karl CroakJames C Chiao, male    DOB: [birth year], 80 y.o.   MRN: [id number]  HPI Comments: N toenail problem L left 1st toenails D and O 1 year off and on C painful, thick, discolored toenail A enclosed shoes T [place] cream for pain     Review of Systems  HENT: Positive for hearing loss and tinnitus.   All other systems reviewed and are negative.      Objective:   Physical Exam Orientated x3 white male  Vascular: DP and PT pulses 2/4 bilaterally  Neurological: Sensation to 10 g monofilament wire intact 4/5 right and 3/5 left Ankle reflexes reactive bilaterally Vibratory sensation nonreactive bilaterally  Dermatological: The left hallux nail is hypertrophic, incurvated, discolored and tender to pressure The right hallux nail is present Atrophic skin without any hair growth noted bilaterally  Musculoskeletal: HAV deformity right       Assessment & Plan:   Assessment: Symptomatic onychomycoses x1 Patient is a history of peripheral arterial disease will defer on phenol matricectomy  Plan: The left hallux nails debrided without any bleeding.  Reappoint at patient's request

## 2013-11-03 ENCOUNTER — Other Ambulatory Visit: Payer: Self-pay | Admitting: Nurse Practitioner

## 2013-12-15 ENCOUNTER — Other Ambulatory Visit: Payer: Self-pay | Admitting: Cardiovascular Disease

## 2013-12-21 ENCOUNTER — Other Ambulatory Visit: Payer: Self-pay | Admitting: Nurse Practitioner

## 2014-01-02 ENCOUNTER — Ambulatory Visit (INDEPENDENT_AMBULATORY_CARE_PROVIDER_SITE_OTHER): Payer: Medicare PPO | Admitting: Cardiovascular Disease

## 2014-01-02 ENCOUNTER — Encounter: Payer: Self-pay | Admitting: Cardiovascular Disease

## 2014-01-02 VITALS — BP 124/68 | HR 80 | Ht 67.0 in | Wt 130.9 lb

## 2014-01-02 DIAGNOSIS — I2510 Atherosclerotic heart disease of native coronary artery without angina pectoris: Secondary | ICD-10-CM

## 2014-01-02 DIAGNOSIS — E785 Hyperlipidemia, unspecified: Secondary | ICD-10-CM

## 2014-01-02 MED ORDER — AMLODIPINE BESYLATE 5 MG PO TABS: ORAL_TABLET | ORAL | Status: DC

## 2014-01-02 MED ORDER — CLOPIDOGREL BISULFATE 75 MG PO TABS: ORAL_TABLET | ORAL | Status: DC

## 2014-01-02 NOTE — Progress Notes (Signed)
HPI:  80 year old gentleman presenting for followup evaluation. The patient has coronary artery disease and has undergone 2 previous bypass surgeries, first in 1987 and redo surgery in 2008. His last cardiac catheterization in 2014 and demonstrated patency of the vein graft sequence to the obtuse marginal branches of the circumflex, vein graft to LAD, and vein graft to OM 3. He had mild to moderate left subclavian stenosis and a known atretic LIMA graft. His left ventricular ejection fraction was estimated at 40%. He has a small infrarenal abdominal aortic aneurysm. The patient has extensive peripheral arterial disease and has undergone carotid endarterectomy as well as lower extremity revascularization surgeries by Dr. Imogene Burn.    the patient has been doing well. He used to take beta blockers as needed for tachycardia, but has had no recent problems. He denies any recent nitroglycerin use. Denies symptoms of chest pain or pressure, shortness of breath, or leg swelling. No recent problems related to claudication.   Outpatient Encounter Prescriptions as of 01/02/2014  Medication Sig  . acetaminophen (TYLENOL) 500 MG tablet Take 1,000 mg by mouth every 6 (six) hours as needed for pain.  [name] amLODipine (NORVASC) 5 MG tablet TAKE ONE TABLET BY MOUTH ONCE DAILY.  [name] aspirin 81 MG tablet Take 81 mg by mouth daily.   [name] atorvastatin (LIPITOR) 10 MG tablet Take 10 mg by mouth at bedtime.   . clopidogrel (PLAVIX) 75 MG tablet TAKE ONE TABLET BY MOUTH ONCE DAILY.  [name] COLCRYS 0.6 MG tablet Take 0.6 mg by mouth daily as needed (gout).   [name] ipratropium (ATROVENT) 0.03 % nasal spray Place 1 spray into the nose daily.   . isosorbide mononitrate (IMDUR) 60 MG 24 hr tablet Take 1 tablet (60 mg total) by mouth 2 (two) times daily.  [name] losartan (COZAAR) 100 MG tablet TAKE ONE TABLET BY MOUTH ONCE DAILY  . Multiple Vitamins-Minerals (MULTIVITAMIN PO) Take 1 tablet by mouth daily.  . nitroGLYCERIN (NITROSTAT) 0.4 MG SL  tablet Place 0.4 mg under the tongue every 5 (five) minutes as needed for chest pain.  . pantoprazole (PROTONIX) 40 MG tablet TAKE ONE TABLET BY MOUTH ONCE DAILY  . permethrin (ELIMITE) 5 % cream   . propranolol (INDERAL) 10 MG tablet Take 1 tablet as needed for heart rate >110  . ranolazine (RANEXA) 500 MG 12 hr tablet Take 1 tablet (500 mg total) by mouth 2 (two) times daily.  . vitamin B-12 (CYANOCOBALAMIN) 100 MCG tablet Take 50 mcg by mouth daily.  . [DISCONTINUED] amLODipine (NORVASC) 5 MG tablet Take 5 mg by mouth daily.  . [DISCONTINUED] clobetasol cream (TEMOVATE) 0.05 %   . [DISCONTINUED] clopidogrel (PLAVIX) 75 MG tablet Take 75 mg by mouth daily.  . [DISCONTINUED] fexofenadine (ALLEGRA) 180 MG tablet Take 180 mg by mouth daily as needed (seasonal allergies).   . [DISCONTINUED] fluticasone (CUTIVATE) 0.05 % cream     Allergies  Allergen Reactions  . Novocain [Procaine Hcl] Other (See Comments)    Cold sweats and very bad headache.   . Metoprolol Hives, Itching and Rash    Past Medical History  Diagnosis Date  . Hyperlipidemia   . Hypertension   . Carotid artery occlusion     s/p L CEA in 2010  . Crohn's disease     colon resection  . Bradycardia     previous intolerance to beta blockers - low dose toprol started 10/2011 for tachycardia but had rash and was discontinued  . Chronic systolic heart failure  echo 7/12: EF 35-40%, anteroseptal hypokinesis, mild MR.  . Ischemic cardiomyopathy     EF 34% (Lexiscan 06/2011), 35-40% (echo 11/2010)  . Coronary artery disease     CABG 1987, redo 2008; Last LHC 1/10: SVG-OM 1/OM 2 patent, SVG-LAD patent, SVG-OM 3 Patent  . Peripheral vascular disease     Carotid dz s/p L CEA, RAS, AAA, LE dz  . Renal artery stenosis     Dopplers 12/2010 - 1-59% R ostial renal artery stenosis, 2 L renal arteries both widely patent  . Gouty arthritis   . AAA (abdominal aortic aneurysm)     Ectatic abdominal aorta with fusiform dilitation 3.4x3.4  and moderate thrombus burden 12/2010  . RBBB   . Tachycardia     a. 10/2011 - atrial tach vs avnrt - BB started.  . Myocardial infarction   . COPD (chronic obstructive pulmonary disease)     ROS: Negative except as per HPI  BP 124/68  Pulse 80  Ht 5\' 7"  (1.702 m)  Wt 130 lb 14.4 oz (59.376 kg)  BMI 20.50 kg/m2  PHYSICAL EXAM: Pt is alert and oriented, pleasant elderly male in NAD HEENT: normal Neck: JVP - normal, carotids 2+= with bilateral bruits Lungs: CTA bilaterally CV: RRR without murmur or gallop Abd: soft, NT, Positive BS, no hepatomegaly Ext: no C/C/E Skin: warm/dry no rash  ASSESSMENT AND PLAN: 1. Coronary artery disease. Stable coronary anatomy at cardiac catheterization last year. He will continue on aspirin, Plavix, amlodipine, and isosorbide. He has no symptoms of angina.  2. Hyperlipidemia. The patient takes atorvastatin. He requests a lipid panel as he has not seen his primary physician in a while. Will order lipids and LFTs.  3. Essential hypertension. Blood pressure is well controlled  4. Bifascicular block. Stable with no symptoms of lightheadedness or presyncope.   5. Peripheral arterial disease. Followed by Dr. Imogene Burn with vascular surgery  For followup, will arrange an appointment with Norma Fredrickson in 6 months and I will plan on seeing him back in one year.  [name] MD 01/02/2014 5:10 PM

## 2014-01-02 NOTE — Patient Instructions (Signed)
Your physician recommends that you continue on your current medications as directed. Please refer to the Current Medication list given to you today.  Your physician recommends that you return for a FASTING LIPID and LIVER profile--nothing to eat or drink after midnight, lab opens at 7:30 AM  Your physician wants you to follow-up in: 6 MONTHS with Norma Fredrickson NP.  You will receive a reminder letter in the mail two months in advance. If you don't receive a letter, please call our office to schedule the follow-up appointment.

## 2014-01-09 ENCOUNTER — Other Ambulatory Visit (INDEPENDENT_AMBULATORY_CARE_PROVIDER_SITE_OTHER): Payer: Medicare PPO

## 2014-01-09 DIAGNOSIS — I2510 Atherosclerotic heart disease of native coronary artery without angina pectoris: Secondary | ICD-10-CM

## 2014-01-09 DIAGNOSIS — E785 Hyperlipidemia, unspecified: Secondary | ICD-10-CM

## 2014-01-09 LAB — LIPID PANEL
CHOLESTEROL: 102 mg/dL (ref 0–200)
HDL: 33.3 mg/dL — ABNORMAL LOW (ref >39.00)
LDL Cholesterol: 48 mg/dL (ref 0–99)
NonHDL: 68.7
Total CHOL/HDL Ratio: 3
Triglycerides: 106 mg/dL (ref 0.0–149.0)
VLDL: 21.2 mg/dL (ref 0.0–40.0)

## 2014-01-09 LAB — HEPATIC FUNCTION PANEL
ALK PHOS: 69 U/L (ref 39–117)
ALT: 16 U/L (ref 0–53)
AST: 21 U/L (ref 0–37)
Albumin: 3.8 g/dL (ref 3.5–5.2)
BILIRUBIN DIRECT: 0.1 mg/dL (ref 0.0–0.3)
BILIRUBIN TOTAL: 0.8 mg/dL (ref 0.2–1.2)
Total Protein: 6.6 g/dL (ref 6.0–8.3)

## 2014-01-10 ENCOUNTER — Other Ambulatory Visit: Payer: Self-pay | Admitting: Cardiovascular Disease

## 2014-01-11 ENCOUNTER — Other Ambulatory Visit: Payer: Self-pay

## 2014-01-31 ENCOUNTER — Other Ambulatory Visit: Payer: Self-pay | Admitting: Nurse Practitioner

## 2014-01-31 ENCOUNTER — Telehealth: Payer: Self-pay | Admitting: Cardiovascular Disease

## 2014-01-31 NOTE — Telephone Encounter (Signed)
Returned patient's call. Patient asking for 92 samples of Ranexa.  Informed patient that we could not supply 92, but that samples would be left at the front for him to pick up. Confirmed that patient is taking 500mg  Ranexa BID. Also gave patient copay card and explained how to use it.  Patient expressed understanding and gratitude for help and says he will pick up samples later today.

## 2014-01-31 NOTE — Telephone Encounter (Signed)
New message          Pt would like for lauren to call him / pt did not disclose any info

## 2014-02-01 ENCOUNTER — Other Ambulatory Visit: Payer: Self-pay | Admitting: Cardiovascular Disease

## 2014-02-12 ENCOUNTER — Emergency Department (HOSPITAL_COMMUNITY): Payer: Medicare PPO

## 2014-02-12 ENCOUNTER — Encounter (HOSPITAL_COMMUNITY): Payer: Self-pay | Admitting: Emergency Medicine

## 2014-02-12 ENCOUNTER — Observation Stay (HOSPITAL_COMMUNITY)
Admission: EM | Admit: 2014-02-12 | Discharge: 2014-02-13 | Disposition: A | Discharge status: Home / Self Care | Payer: Medicare PPO | Attending: Cardiology | Admitting: Cardiology

## 2014-02-12 DIAGNOSIS — I452 Bifascicular block: Secondary | ICD-10-CM | POA: Insufficient documentation

## 2014-02-12 DIAGNOSIS — I252 Old myocardial infarction: Secondary | ICD-10-CM | POA: Insufficient documentation

## 2014-02-12 DIAGNOSIS — Z951 Presence of aortocoronary bypass graft: Secondary | ICD-10-CM | POA: Insufficient documentation

## 2014-02-12 DIAGNOSIS — I4510 Unspecified right bundle-branch block: Secondary | ICD-10-CM | POA: Insufficient documentation

## 2014-02-12 DIAGNOSIS — J449 Chronic obstructive pulmonary disease, unspecified: Secondary | ICD-10-CM | POA: Diagnosis not present

## 2014-02-12 DIAGNOSIS — I5022 Chronic systolic (congestive) heart failure: Secondary | ICD-10-CM | POA: Diagnosis not present

## 2014-02-12 DIAGNOSIS — I255 Ischemic cardiomyopathy: Secondary | ICD-10-CM | POA: Insufficient documentation

## 2014-02-12 DIAGNOSIS — E785 Hyperlipidemia, unspecified: Secondary | ICD-10-CM | POA: Diagnosis not present

## 2014-02-12 DIAGNOSIS — I200 Unstable angina: Secondary | ICD-10-CM | POA: Diagnosis not present

## 2014-02-12 DIAGNOSIS — I2510 Atherosclerotic heart disease of native coronary artery without angina pectoris: Secondary | ICD-10-CM | POA: Insufficient documentation

## 2014-02-12 DIAGNOSIS — R0789 Other chest pain: Secondary | ICD-10-CM | POA: Diagnosis present

## 2014-02-12 DIAGNOSIS — Z7982 Long term (current) use of aspirin: Secondary | ICD-10-CM | POA: Insufficient documentation

## 2014-02-12 DIAGNOSIS — Z7902 Long term (current) use of antithrombotics/antiplatelets: Secondary | ICD-10-CM | POA: Insufficient documentation

## 2014-02-12 DIAGNOSIS — I10 Essential (primary) hypertension: Secondary | ICD-10-CM | POA: Diagnosis not present

## 2014-02-12 DIAGNOSIS — N289 Disorder of kidney and ureter, unspecified: Secondary | ICD-10-CM | POA: Insufficient documentation

## 2014-02-12 DIAGNOSIS — I714 Abdominal aortic aneurysm, without rupture: Secondary | ICD-10-CM | POA: Insufficient documentation

## 2014-02-12 LAB — CBC WITH DIFFERENTIAL/PLATELET
BASOS ABS: 0 10*3/uL (ref 0.0–0.1)
BASOS PCT: 0 % (ref 0–1)
EOS ABS: 0.1 10*3/uL (ref 0.0–0.7)
EOS PCT: 1 % (ref 0–5)
HEMATOCRIT: 38.1 % — AB (ref 39.0–52.0)
Hemoglobin: 12.9 g/dL — ABNORMAL LOW (ref 13.0–17.0)
Lymphocytes Relative: 27 % (ref 12–46)
Lymphs Abs: 1.3 10*3/uL (ref 0.7–4.0)
MCH: 33 pg (ref 26.0–34.0)
MCHC: 33.9 g/dL (ref 30.0–36.0)
MCV: 97.4 fL (ref 78.0–100.0)
MONO ABS: 0.5 10*3/uL (ref 0.1–1.0)
MONOS PCT: 9 % (ref 3–12)
Neutro Abs: 3.1 10*3/uL (ref 1.7–7.7)
Neutrophils Relative %: 63 % (ref 43–77)
Platelets: 123 10*3/uL — ABNORMAL LOW (ref 150–400)
RBC: 3.91 MIL/uL — ABNORMAL LOW (ref 4.22–5.81)
RDW: 13.2 % (ref 11.5–15.5)
WBC: 4.9 10*3/uL (ref 4.0–10.5)

## 2014-02-12 LAB — COMPREHENSIVE METABOLIC PANEL
ALBUMIN: 4 g/dL (ref 3.5–5.2)
ALT: 16 U/L (ref 0–53)
ANION GAP: 13 (ref 5–15)
AST: 19 U/L (ref 0–37)
Alkaline Phosphatase: 76 U/L (ref 39–117)
BUN: 27 mg/dL — AB (ref 6–23)
CALCIUM: 9 mg/dL (ref 8.4–10.5)
CO2: 24 mEq/L (ref 19–32)
CREATININE: 1.52 mg/dL — AB (ref 0.50–1.35)
Chloride: 103 mEq/L (ref 96–112)
GFR calc Af Amer: 47 mL/min — ABNORMAL LOW (ref >90)
GFR calc non Af Amer: 40 mL/min — ABNORMAL LOW (ref >90)
Glucose, Bld: 97 mg/dL (ref 70–99)
Potassium: 4.1 mEq/L (ref 3.7–5.3)
Sodium: 140 mEq/L (ref 137–147)
Total Bilirubin: 0.4 mg/dL (ref 0.3–1.2)
Total Protein: 6.9 g/dL (ref 6.0–8.3)

## 2014-02-12 LAB — TROPONIN I

## 2014-02-12 MED ORDER — SODIUM CHLORIDE 0.9 % IV SOLN
INTRAVENOUS | Status: DC
  Administered 2014-02-12: 20:00:00 via INTRAVENOUS

## 2014-02-12 MED ORDER — CLOPIDOGREL BISULFATE 75 MG PO TABS
75.0000 mg | ORAL_TABLET | Freq: Every day | ORAL | Status: DC
  Administered 2014-02-13: 75 mg via ORAL
  Filled 2014-02-12: qty 1

## 2014-02-12 MED ORDER — NITROGLYCERIN 0.4 MG SL SUBL: 0.4000 mg | SUBLINGUAL_TABLET | SUBLINGUAL | Status: DC | PRN

## 2014-02-12 MED ORDER — RANOLAZINE ER 500 MG PO TB12: 500.0000 mg | ORAL_TABLET | Freq: Two times a day (BID) | ORAL | Status: DC

## 2014-02-12 MED ORDER — ATORVASTATIN CALCIUM 10 MG PO TABS
10.0000 mg | ORAL_TABLET | Freq: Every day | ORAL | Status: DC
  Administered 2014-02-13: 10 mg via ORAL
  Filled 2014-02-12 (×2): qty 1

## 2014-02-12 MED ORDER — ISOSORBIDE MONONITRATE ER 60 MG PO TB24: 60.0000 mg | ORAL_TABLET | Freq: Two times a day (BID) | ORAL | Status: DC

## 2014-02-12 MED ORDER — AMLODIPINE BESYLATE 5 MG PO TABS
5.0000 mg | ORAL_TABLET | Freq: Every day | ORAL | Status: DC
  Administered 2014-02-13: 5 mg via ORAL
  Filled 2014-02-12: qty 1

## 2014-02-12 MED ORDER — ONDANSETRON HCL 4 MG/2ML IJ SOLN: 4.0000 mg | Freq: Four times a day (QID) | INTRAMUSCULAR | Status: DC | PRN

## 2014-02-12 MED ORDER — ASPIRIN 81 MG PO TABS: 81.0000 mg | ORAL_TABLET | Freq: Every day | ORAL | Status: DC

## 2014-02-12 MED ORDER — ASPIRIN EC 81 MG PO TBEC
81.0000 mg | DELAYED_RELEASE_TABLET | Freq: Every day | ORAL | Status: DC
  Administered 2014-02-13: 81 mg via ORAL
  Filled 2014-02-12: qty 1

## 2014-02-12 MED ORDER — LOSARTAN POTASSIUM 50 MG PO TABS
100.0000 mg | ORAL_TABLET | Freq: Every day | ORAL | Status: DC
  Administered 2014-02-13: 100 mg via ORAL
  Filled 2014-02-12: qty 2

## 2014-02-12 MED ORDER — PANTOPRAZOLE SODIUM 40 MG PO TBEC
40.0000 mg | DELAYED_RELEASE_TABLET | Freq: Every day | ORAL | Status: DC
  Administered 2014-02-13: 40 mg via ORAL
  Filled 2014-02-12: qty 1

## 2014-02-12 MED ORDER — ACETAMINOPHEN 325 MG PO TABS: 650.0000 mg | ORAL_TABLET | ORAL | Status: DC | PRN

## 2014-02-12 NOTE — H&P (Addendum)
Admit date: 02/12/2014 Referring Physician: Dr. Freida Busman Primary Cardiologist Dr. [name] Chief complaint/reason for admission:Chest pain  HPI: This is an 80yo WM with a history of ASCAD s/p CABG in 1987 with redo 2008 with last cath 2014 showing patent SVG to OM1/OM2, patent SVG to LAD, patent SVG to OM3.  He has an ischemic DCM EF 35-40% by echo 2012, PVD with Left CEA and mild to moderate left subclavian stenosis and known atretic LIMA graft.  He has renal artery stenosis and AAA, chronic systolic CHF, HTN, dyslipidemia, RBBB and COPD who was in his USOH until last week when he started having CP. He has had chronic stable angina for years occurring a few days weekly which has not been responsive to NTG in the past.  He says that about 2 weeks ago he started having chest tightness in his chest that was similar to his prior angina but started lasting longer and was associated with sharp pains.  The pain would last until he would sit down and relax.  It usually is brought on by activity.  He denies any SOB,diaphoresis or nausea with the pain.  He says that NTG dose not help the discomfort.  Today he noticed that his BP was 196 mmHg systolic and HR was 109bpm.  He then took it again and the SBP was and he took a dose of propranolol which he takes when his HR gets above 100bpm.  He was not having CP at the time but he says that he has had chest tightness intermittently throughout the day.  The patient is somewhat of a difficult historian to get a good history from.  He has had some chronic RLE edema which has not changed.    PMH:    Past Medical History  Diagnosis Date  . Hyperlipidemia   . Hypertension   . Carotid artery occlusion     s/p L CEA in 2010  . Crohn's disease     colon resection  . Bradycardia     previous intolerance to beta blockers - low dose toprol started 10/2011 for tachycardia but had rash and was discontinued  . Chronic systolic heart failure     echo 7/12: EF  35-40%, anteroseptal hypokinesis, mild MR.  . Ischemic cardiomyopathy     EF 34% (Lexiscan 06/2011), 35-40% (echo 11/2010)  . Coronary artery disease     CABG 1987, redo 2008; Last LHC 1/10: SVG-OM 1/OM 2 patent, SVG-LAD patent, SVG-OM 3 Patent  . Peripheral vascular disease     Carotid dz s/p L CEA, RAS, AAA, LE dz  . Renal artery stenosis     Dopplers 12/2010 - 1-59% R ostial renal artery stenosis, 2 L renal arteries both widely patent  . Gouty arthritis   . AAA (abdominal aortic aneurysm)     Ectatic abdominal aorta with fusiform dilitation 3.4x3.4 and moderate thrombus burden 12/2010  . RBBB   . Tachycardia     a. 10/2011 - atrial tach vs avnrt - BB started.  . Myocardial infarction   . COPD (chronic obstructive pulmonary disease)     PSH:    Past Surgical History  Procedure Laterality Date  . Carotid endarterectomy  2010    left  . Colon resection    . Tonsillectomy      as a child  . Pr vein bypass graft,aorto-fem-pop  10/1985  . Vein bypass surgery      LEFT   06/2011  . Endarterectomy  08/26/2011  Procedure: ENDARTERECTOMY ILIAC;  Surgeon: Fransisco HertzBrian L Chen, MD;  [facility];  Service: Vascular;  Laterality: Right;  Right Femoral Artery Endarterectomy with vascu guard patch angioplasty & intraoperative arteriogram.  . Coronary artery bypass graft  10/31/85 & 08/19/06  . Cardiac catheterization  07/28/2012    Native 3v CAD, continued patency of the SVG-OM1-LAD and SVG-OM2-left PDA. LVEF 40% with multiple WMAs    ALLERGIES:   Novocain and Metoprolol  Prior to Admit Meds:   (Not in a hospital admission) Family HX:    Family History  Problem Relation Age of Onset  . Heart disease Father   . Cancer Mother   . COPD Mother   . Anesthesia problems Neg Hx   . Deep vein thrombosis Brother   . COPD Brother   . CAD Brother    Social HX:    History   Social History  . Marital Status: Married    Spouse Name: N/A    Number of Children: N/A  . Years of Education: N/A    Occupational History  . Not on file.   Social History Main Topics  . Smoking status: Never Smoker   . Smokeless tobacco: Never Used  . Alcohol Use: No  . Drug Use: No  . Sexual Activity: Yes   Other Topics Concern  . Not on file   Social History Narrative  . No narrative on file     ROS:  All 11 ROS were addressed and are negative except what is stated in the HPI  PHYSICAL EXAM Filed Vitals:   02/12/14 2130  BP: 123/69  Pulse: 51  Temp:   Resp: 13   General: Well developed, well nourished, in no acute distress Head: Eyes PERRLA, No xanthomas.   Normal cephalic and atramatic  Lungs:   Clear bilaterally to auscultation and percussion. Heart:   HRRR S1 S2 Pulses are 2+ & equal.            right carotid bruit. No JVD.  No abdominal bruits. No femoral bruits. Abdomen: Bowel sounds are positive, abdomen soft and non-tender without masses  Extremities:   No clubbing, cyanosis or edema.  DP +1 Neuro: Alert and oriented X 3. Psych:  Good affect, responds appropriately   Labs:   Lab Results  Component Value Date   WBC 4.9 02/12/2014   HGB 12.9* 02/12/2014   HCT 38.1* 02/12/2014   MCV 97.4 02/12/2014   PLT 123* 02/12/2014    Recent Labs Lab 02/12/14 1912  NA 140  K 4.1  CL 103  CO2 24  BUN 27*  CREATININE 1.52*  CALCIUM 9.0  PROT 6.9  BILITOT 0.4  ALKPHOS 76  ALT 16  AST 19  GLUCOSE 97   Lab Results  Component Value Date   CKTOTAL 46 10/18/2011   CKMB 3.3 10/18/2011   TROPONINI <0.30 02/12/2014   No results found for this basename: PTT   Lab Results  Component Value Date   INR 1.05 07/27/2012   INR 1.03 08/12/2011   INR 1.03 06/26/2011     Lab Results  Component Value Date   CHOL 102 01/09/2014   CHOL 87 10/18/2011   CHOL 104 11/24/2010   Lab Results  Component Value Date   HDL 33.30* 01/09/2014   HDL 36* 10/18/2011   HDL 41 1/61/09607/16/2012   Lab Results  Component Value Date   LDLCALC 48 01/09/2014   LDLCALC 34 10/18/2011   LDLCALC 49 11/24/2010   Lab Results  Component Value Date   TRIG 106.0 01/09/2014   TRIG 83 10/18/2011   TRIG 70 11/24/2010   Lab Results  Component Value Date   CHOLHDL 3 01/09/2014   CHOLHDL 2.4 10/18/2011   CHOLHDL 2.5 11/24/2010   No results found for this basename: LDLDIRECT      Radiology:  No results found.  EKG:  NSR with RBBB and prolonged QTc, anterior infarct age undetermined  ASSESSMENT:  1.  Unstable angina - patient is somewhat of a poor historian.  He has chronic angina which he describes as a tightness that he gets several times weekly and is not responsive to NTG.  This has been going on for years.  Recently it seems to occur more frequently but only with exertion and resolves with rest but is lasting longer and associated with some sharp chest pains as well.  Initial Troponin is normal and EKG shows chronic RBBB that is unchanged.  Currently pain free. 2.  ASCAD s/p remote CABG with redo in 2008 with repeat cath in 2010 with patent grafts. 3.  HTN - elevated earlier today but now controlled 4.  Dyslipidemia 5.  Carotid artery stenosis s/p left CEA with right bruit 6.  Chronic systolic CHF - appears euvolemic on exam 7.  Ischemic DCM EF 35-40% 8.  PVD followed by Dr. Imogene Burn 9.  Bifascicular block - unchanged EKG 10. Acute renal insuff  PLAN:   1.  Admit to tele bed  2.  Cycle cardiac enzymes 3.  Will hold on IV Heparin gtt unless enzymes become positive due to mild thrombocytopenia 4.  Continue home meds 5.  NPO after MN 6.  Consider Lexiscan myoview in am if enzymes negative 7.  Check D-Dimer  Quintella Reichert, MD  02/12/2014  9:44 PM

## 2014-02-12 NOTE — ED Notes (Signed)
Cardiology at [facility]

## 2014-02-12 NOTE — ED Provider Notes (Signed)
CSN: [id number]     Arrival date & time 02/12/14  1853 History   First MD Initiated Contact with Patient 02/12/14 1904     Chief Complaint  Patient presents with  . Chest Pain     (Consider location/radiation/quality/duration/timing/severity/associated sxs/prior Treatment) HPI Comments: Patient here complaining of intermittent chest pressure and chest pain x5 days. Patient was hypertensive at home and has been compliant with his medications. He has been using nitroglycerin off-and-on with out relief. Symptoms are easy with exertional and better with rest. Pain is located in the center of his chest and does not radiate. They are not associated with diaphoresis or dyspnea. No associated nausea or vomiting. EMS was called today and patient given nitroglycerin which did relieve his chest pain. He is currently pain-free  Patient is a 80 y.o. male presenting with chest pain. The history is provided by the patient and a relative.  Chest Pain   Past Medical History  Diagnosis Date  . Hyperlipidemia   . Hypertension   . Carotid artery occlusion     s/p L CEA in 2010  . Crohn's disease     colon resection  . Bradycardia     previous intolerance to beta blockers - low dose toprol started 10/2011 for tachycardia but had rash and was discontinued  . Chronic systolic heart failure     echo 7/12: EF 35-40%, anteroseptal hypokinesis, mild MR.  . Ischemic cardiomyopathy     EF 34% (Lexiscan 06/2011), 35-40% (echo 11/2010)  . Coronary artery disease     CABG 1987, redo 2008; Last LHC 1/10: SVG-OM 1/OM 2 patent, SVG-LAD patent, SVG-OM 3 Patent  . Peripheral vascular disease     Carotid dz s/p L CEA, RAS, AAA, LE dz  . Renal artery stenosis     Dopplers 12/2010 - 1-59% R ostial renal artery stenosis, 2 L renal arteries both widely patent  . Gouty arthritis   . AAA (abdominal aortic aneurysm)     Ectatic abdominal aorta with fusiform dilitation 3.4x3.4 and moderate thrombus burden 12/2010  . RBBB   .  Tachycardia     a. 10/2011 - atrial tach vs avnrt - BB started.  . Myocardial infarction   . COPD (chronic obstructive pulmonary disease)    Past Surgical History  Procedure Laterality Date  . Carotid endarterectomy  2010    left  . Colon resection    . Tonsillectomy      as a child  . Pr vein bypass graft,aorto-fem-pop  10/1985  . Vein bypass surgery      LEFT   06/2011  . Endarterectomy  08/26/2011    Procedure: ENDARTERECTOMY ILIAC;  Surgeon: [name], MD;  Location: [facility];  Service: Vascular;  Laterality: Right;  Right Femoral Artery Endarterectomy with vascu guard patch angioplasty & intraoperative arteriogram.  . Coronary artery bypass graft  10/31/85 & 08/19/06  . Cardiac catheterization  07/28/2012    Native 3v CAD, continued patency of the SVG-OM1-LAD and SVG-OM2-left PDA. LVEF 40% with multiple WMAs   Family History  Problem Relation Age of Onset  . Heart disease Father   . Cancer Mother   . COPD Mother   . Anesthesia problems Neg Hx   . Deep vein thrombosis Brother   . COPD Brother   . CAD Brother    History  Substance Use Topics  . Smoking status: Never Smoker   . Smokeless tobacco: Never Used  . Alcohol Use: No    Review of  Systems  Cardiovascular: Positive for chest pain.  All other systems reviewed and are negative.     Allergies  Novocain and Metoprolol  Home Medications   Prior to Admission medications   Medication Sig Start Date End Date Taking? Authorizing Provider  acetaminophen (TYLENOL) 500 MG tablet Take 1,000 mg by mouth every 6 (six) hours as needed for pain.    Historical Provider, MD  amLODipine (NORVASC) 5 MG tablet TAKE ONE TABLET BY MOUTH ONCE DAILY. 01/02/14   Tonny BollmanMichael Cooper, MD  aspirin 81 MG tablet Take 81 mg by mouth daily.     Historical Provider, MD  atorvastatin (LIPITOR) 10 MG tablet Take 10 mg by mouth at bedtime.     Historical Provider, MD  clopidogrel (PLAVIX) 75 MG tablet TAKE ONE TABLET BY MOUTH ONCE DAILY. 01/02/14    Tonny BollmanMichael Cooper, MD  COLCRYS 0.6 MG tablet Take 0.6 mg by mouth daily as needed (gout).  04/17/11   Historical Provider, MD  ipratropium (ATROVENT) 0.03 % nasal spray Place 1 spray into the nose daily.     Historical Provider, MD  isosorbide mononitrate (IMDUR) 60 MG 24 hr tablet Take 1 tablet (60 mg total) by mouth 2 (two) times daily. 09/05/13   Peter M SwazilandJordan, MD  losartan (COZAAR) 100 MG tablet TAKE ONE TABLET BY MOUTH ONCE DAILY 02/02/14   Tonny BollmanMichael Cooper, MD  Multiple Vitamins-Minerals (MULTIVITAMIN PO) Take 1 tablet by mouth daily.    Historical Provider, MD  nitroGLYCERIN (NITROSTAT) 0.4 MG SL tablet Place 0.4 mg under the tongue every 5 (five) minutes as needed for chest pain.    Historical Provider, MD  pantoprazole (PROTONIX) 40 MG tablet TAKE ONE TABLET BY MOUTH ONCE DAILY. NEED OFFICE VISIT 01/11/14   Tonny BollmanMichael Cooper, MD  permethrin Verner Mould(ELIMITE) 5 % cream  12/04/13   Historical Provider, MD  propranolol (INDERAL) 10 MG tablet Take 1 tablet as needed for heart rate >110 07/22/12   Historical Provider, MD  RANEXA 500 MG 12 hr tablet TAKE ONE TABLET BY MOUTH TWICE DAILY. 01/31/14   Tonny BollmanMichael Cooper, MD  vitamin B-12 (CYANOCOBALAMIN) 100 MCG tablet Take 50 mcg by mouth daily.    Historical Provider, MD   BP 147/83  Pulse 75  Temp(Src) 97.8 F (36.6 C) (Oral)  Resp 23  Ht 5\' 7"  (1.702 m)  Wt 135 lb (61.236 kg)  BMI 21.14 kg/m2  SpO2 99% Physical Exam  Nursing note and vitals reviewed. Constitutional: He is oriented to person, place, and time. He appears well-developed and well-nourished.  Non-toxic appearance. No distress.  HENT:  Head: Normocephalic and atraumatic.  Eyes: Conjunctivae, EOM and lids are normal. Pupils are equal, round, and reactive to light.  Neck: Normal range of motion. Neck supple. No tracheal deviation present. No mass present.  Cardiovascular: Normal rate, regular rhythm and normal heart sounds.  Exam reveals no gallop.   No murmur heard. Pulmonary/Chest: Effort normal  and breath sounds normal. No stridor. No respiratory distress. He has no decreased breath sounds. He has no wheezes. He has no rhonchi. He has no rales.  Abdominal: Soft. Normal appearance and bowel sounds are normal. He exhibits no distension. There is no tenderness. There is no rebound and no CVA tenderness.  Musculoskeletal: Normal range of motion. He exhibits no edema and no tenderness.  Neurological: He is alert and oriented to person, place, and time. He has normal strength. No cranial nerve deficit or sensory deficit. GCS eye subscore is 4. GCS verbal subscore is 5. GCS  motor subscore is 6.  Skin: Skin is warm and dry. No abrasion and no rash noted.  Psychiatric: He has a normal mood and affect. His speech is normal and behavior is normal.    ED Course  Procedures (including critical care time) Labs Review Labs Reviewed  TROPONIN I  CBC WITH DIFFERENTIAL  COMPREHENSIVE METABOLIC PANEL    Imaging Review No results found.   EKG Interpretation   Date/Time:  Monday February 12 2014 19:02:08 EDT Ventricular Rate:  78 PR Interval:  252 QRS Duration: 169 QT Interval:  475 QTC Calculation: 541 R Axis:   -35 Text Interpretation:  Sinus rhythm Prolonged PR interval Right bundle  branch block Anterior infarct, age indeterminate No significant change  since last tracing Confirmed by Othell Diluzio  MD, Shervin Cypert ([id number]) on 02/12/2014  7:06:58 PM      MDM   Final diagnoses:  None     Patient to be seen by cardiology for admission   Toy Baker, MD 02/12/14 2136

## 2014-02-12 NOTE — ED Notes (Signed)
Tyler Pita: [phone] Darin Baliff: [phone] or [phone]

## 2014-02-12 NOTE — ED Notes (Signed)
Lights dimmed; no needs at this time.

## 2014-02-12 NOTE — ED Notes (Signed)
Pt comes via [facility] from home, c/o CP since last Thursday, pt was hypertensive, pt has had two previous MI's, pt took one nitro prior to EMS arrival, and 324 of ASA, EMS gave another nitro. No c/o SOB or N/V

## 2014-02-12 NOTE — ED Notes (Signed)
Assisted pt to the bathroom, pt was able to ambulate with no complaints.

## 2014-02-12 NOTE — ED Notes (Signed)
Patient transported to X-ray 

## 2014-02-13 ENCOUNTER — Inpatient Hospital Stay (HOSPITAL_COMMUNITY): Payer: Medicare PPO

## 2014-02-13 DIAGNOSIS — I5022 Chronic systolic (congestive) heart failure: Secondary | ICD-10-CM | POA: Diagnosis not present

## 2014-02-13 DIAGNOSIS — E785 Hyperlipidemia, unspecified: Secondary | ICD-10-CM | POA: Diagnosis not present

## 2014-02-13 DIAGNOSIS — I200 Unstable angina: Secondary | ICD-10-CM | POA: Diagnosis not present

## 2014-02-13 DIAGNOSIS — R079 Chest pain, unspecified: Secondary | ICD-10-CM

## 2014-02-13 DIAGNOSIS — I10 Essential (primary) hypertension: Secondary | ICD-10-CM | POA: Diagnosis not present

## 2014-02-13 DIAGNOSIS — R0789 Other chest pain: Secondary | ICD-10-CM

## 2014-02-13 LAB — PROTIME-INR
INR: 1.09 (ref 0.00–1.49)
Prothrombin Time: 14.1 seconds (ref 11.6–15.2)

## 2014-02-13 LAB — COMPREHENSIVE METABOLIC PANEL
ALK PHOS: 80 U/L (ref 39–117)
ALT: 16 U/L (ref 0–53)
ANION GAP: 13 (ref 5–15)
AST: 20 U/L (ref 0–37)
Albumin: 3.9 g/dL (ref 3.5–5.2)
BUN: 24 mg/dL — AB (ref 6–23)
CO2: 26 mEq/L (ref 19–32)
Calcium: 8.9 mg/dL (ref 8.4–10.5)
Chloride: 103 mEq/L (ref 96–112)
Creatinine, Ser: 1.38 mg/dL — ABNORMAL HIGH (ref 0.50–1.35)
GFR calc non Af Amer: 45 mL/min — ABNORMAL LOW (ref >90)
GFR, EST AFRICAN AMERICAN: 53 mL/min — AB (ref >90)
Glucose, Bld: 97 mg/dL (ref 70–99)
POTASSIUM: 3.7 meq/L (ref 3.7–5.3)
Sodium: 142 mEq/L (ref 137–147)
TOTAL PROTEIN: 7.3 g/dL (ref 6.0–8.3)
Total Bilirubin: 0.5 mg/dL (ref 0.3–1.2)

## 2014-02-13 LAB — TROPONIN I

## 2014-02-13 LAB — APTT: aPTT: 34 seconds (ref 24–37)

## 2014-02-13 LAB — MAGNESIUM: Magnesium: 1.9 mg/dL (ref 1.5–2.5)

## 2014-02-13 LAB — PRO B NATRIURETIC PEPTIDE: PRO B NATRI PEPTIDE: 439 pg/mL (ref 0–450)

## 2014-02-13 LAB — TSH: TSH: 3.85 u[IU]/mL (ref 0.350–4.500)

## 2014-02-13 MED ORDER — AMLODIPINE BESYLATE 5 MG PO TABS: 7.5000 mg | ORAL_TABLET | Freq: Two times a day (BID) | ORAL | Status: DC

## 2014-02-13 MED ORDER — RANOLAZINE ER 500 MG PO TB12
500.0000 mg | ORAL_TABLET | Freq: Two times a day (BID) | ORAL | Status: DC
  Administered 2014-02-13 (×2): 500 mg via ORAL
  Filled 2014-02-13 (×3): qty 1

## 2014-02-13 MED ORDER — ISOSORBIDE MONONITRATE ER 60 MG PO TB24
60.0000 mg | ORAL_TABLET | Freq: Two times a day (BID) | ORAL | Status: DC
  Administered 2014-02-13 (×2): 60 mg via ORAL
  Filled 2014-02-13 (×3): qty 1

## 2014-02-13 MED ORDER — TECHNETIUM TC 99M SESTAMIBI GENERIC - CARDIOLITE
10.0000 | Freq: Once | INTRAVENOUS | Status: AC | PRN
  Administered 2014-02-13: 10 via INTRAVENOUS

## 2014-02-13 MED ORDER — LOSARTAN POTASSIUM 100 MG PO TABS: 100.0000 mg | ORAL_TABLET | Freq: Every day | ORAL | Status: DC

## 2014-02-13 MED ORDER — RANOLAZINE ER 500 MG PO TB12: 500.0000 mg | ORAL_TABLET | Freq: Two times a day (BID) | ORAL | Status: DC

## 2014-02-13 MED ORDER — REGADENOSON 0.4 MG/5ML IV SOLN
0.4000 mg | Freq: Once | INTRAVENOUS | Status: AC
  Administered 2014-02-13: 0.4 mg via INTRAVENOUS
  Filled 2014-02-13: qty 5

## 2014-02-13 MED ORDER — REGADENOSON 0.4 MG/5ML IV SOLN
INTRAVENOUS | Status: AC
  Filled 2014-02-13: qty 5

## 2014-02-13 MED ORDER — TECHNETIUM TC 99M SESTAMIBI GENERIC - CARDIOLITE
30.0000 | Freq: Once | INTRAVENOUS | Status: AC | PRN
  Administered 2014-02-13: 30 via INTRAVENOUS

## 2014-02-13 MED ORDER — ISOSORBIDE MONONITRATE ER 120 MG PO TB24: 120.0000 mg | ORAL_TABLET | Freq: Every day | ORAL | Status: DC

## 2014-02-13 NOTE — Progress Notes (Signed)
Pt given d/c instructions; pt verbalized understanding; IV and tele monitor d/c at this time; will cont. To monitor.

## 2014-02-13 NOTE — Progress Notes (Signed)
Reason for Admission: Unstable Angina  Patient Profile: 80yo WM with a history of ASCAD s/p CABG in 1987 with redo 2008 with last cath 2014 showing patent SVG to OM1/OM2, patent SVG to LAD, patent SVG to OM3. He has an ischemic DCM EF 35-40% by echo 2012, PVD with Left CEA and mild to moderate left subclavian stenosis and known atretic LIMA graft. He has renal artery stenosis and AAA, chronic systolic CHF, HTN, dyslipidemia, RBBB and COPD.    Subjective: No recurrent chest pain overnight. Currently CP free. Denies dyspnea.   Objective: Vital signs in last 24 hours: Temp:  [97.8 F (36.6 C)] 97.8 F (36.6 C) (10/05 2355) Pulse Rate:  [51-82] 77 (10/05 2355) Resp:  [12-23] 18 (10/05 2355) BP: (116-153)/(57-89) 143/77 mmHg (10/05 2355) SpO2:  [93 %-100 %] 99 % (10/05 2355) Weight:  [135 lb (61.236 kg)-135 lb 12.9 oz (61.6 kg)] 135 lb 12.9 oz (61.6 kg) (10/05 2355) Last BM Date: 02/12/14  Intake/Output from previous day:   Intake/Output this shift:    Medications Current Facility-Administered Medications  Medication Dose Route Frequency Provider Last Rate Last Dose  . acetaminophen (TYLENOL) tablet 650 mg  650 mg Oral Q4H PRN Quintella Reichertraci R Turner, MD      . amLODipine (NORVASC) tablet 5 mg  5 mg Oral Daily Quintella Reichertraci R Turner, MD      . aspirin EC tablet 81 mg  81 mg Oral Daily Quintella Reichertraci R Turner, MD      . atorvastatin (LIPITOR) tablet 10 mg  10 mg Oral QHS Quintella Reichertraci R Turner, MD   10 mg at 02/13/14 0047  . clopidogrel (PLAVIX) tablet 75 mg  75 mg Oral Daily Quintella Reichertraci R Turner, MD      . isosorbide mononitrate (IMDUR) 24 hr tablet 60 mg  60 mg Oral BID Tonny BollmanMichael Cooper, MD   60 mg at 02/13/14 0047  . losartan (COZAAR) tablet 100 mg  100 mg Oral Daily Quintella Reichertraci R Turner, MD      . nitroGLYCERIN (NITROSTAT) SL tablet 0.4 mg  0.4 mg Sublingual Q5 min PRN Quintella Reichertraci R Turner, MD      . ondansetron (ZOFRAN) injection 4 mg  4 mg Intravenous Q6H PRN Quintella Reichertraci R Turner, MD      . pantoprazole (PROTONIX) EC tablet 40 mg  40  mg Oral Daily Quintella Reichertraci R Turner, MD      . ranolazine (RANEXA) 12 hr tablet 500 mg  500 mg Oral BID Tonny BollmanMichael Cooper, MD   500 mg at 02/13/14 0047    PE: General appearance: alert, cooperative and no distress Neck: no carotid bruit and no JVD Lungs: clear to auscultation bilaterally Heart: regular rate and rhythm Extremities: no LEE Pulses: 2+ and symmetric Skin: warm and dry Neurologic: Grossly normal  Lab Results:   Recent Labs  02/12/14 1912  WBC 4.9  HGB 12.9*  HCT 38.1*  PLT 123*   BMET  Recent Labs  02/12/14 1912 02/13/14 0041  NA 140 142  K 4.1 3.7  CL 103 103  CO2 24 26  GLUCOSE 97 97  BUN 27* 24*  CREATININE 1.52* 1.38*  CALCIUM 9.0 8.9   PT/INR  Recent Labs  02/13/14 0041  LABPROT 14.1  INR 1.09   Cardiac Panel (last 3 results)  Recent Labs  02/12/14 1912 02/13/14 0041  TROPONINI <0.30 <0.30    Assessment/Plan  Active Problems:   Unstable angina pectoris  1. Unstable Angina: No recurrent CP overnight. Mainly occurs with exertion. Enzymes negative  x 2. He has been NPO since midnight. Will plan for nuclear stress testing today. Since his symptoms occur mainly with exertion can consider exercise nuclear study. He ambulates without use of an assistive device and is stable on his feet w/o history of falls. His resting HR is in the upper 70s. He is not on a BB and can likely reach target HR of 108 in Phase I of Bruce Protocol. Will discuss exercise vs chemical with Dr. Excell Seltzer. For now continue medical therapy: ASA, LA nitrate, Ranexa, ARB and statin. ? Why he is not on BB therapy.   2. HTN: moderately elevated this am in the 140s systolic. Continue amlodipine, losartan and Imdur.   3. HLD: most recent lipid panel 9/1 demonstrated LDL was at goal at 48 mg/dL. Continue Lipitor.    LOS: 1 day    Brittainy M. Sharol Harness, PA-C 02/13/2014 7:54 AM  History and all data above reviewed.  Patient examined.  I agree with the findings as above. No further  chest pain.   The patient exam reveals COR:RRR  ,  Lungs: Clear  ,  Abd: Positive bowel sounds, no rebound no guarding, Ext No edema  .  All available labs, radiology testing, previous records reviewed. Agree with documented assessment and plan. Chest pain:  Ruled out.  Scar on stress test.  Only perhaps mild ischemia.  Imdur should be once daily 120 mg at discharge (tolerance at BID dosing).  Increase Norvasc to 7.5 mg bid.   Casper Malu Pellegrini  12:48 PM  02/13/2014

## 2014-02-13 NOTE — Discharge Summary (Addendum)
Physician Discharge Summary  Patient ID: Karl Stevenson MRN: [id number] DOB/AGE: Sep 08, 1926 80 y.o.  Primary Cardiologist: Dr. Excell Seltzerooper  Admit date: 02/12/2014 Discharge date: 02/13/2014  Admission Diagnoses: Unstable Angina  Discharge Diagnoses:  Active Problems:   Unstable angina pectoris   Discharged Condition: stable  HPI: This is an 80yo WM, followed by Dr. Excell Seltzerooper, with a history of ASCAD s/p CABG in 1987 with redo 2008 with last cath 2014 showing patent SVG to OM1/OM2, patent SVG to LAD, patent SVG to OM3. He has an ischemic DCM EF 35-40% by echo 2012, PVD with Left CEA and mild to moderate left subclavian stenosis and known atretic LIMA graft. He has renal artery stenosis and AAA, chronic systolic CHF, HTN, dyslipidemia, RBBB and COPD who was in his USOH until last week when he started having CP. He has had chronic stable angina for years occurring a few days weekly which has not been responsive to NTG in the past. He says that about 2 weeks ago he started having chest tightness in his chest that was similar to his prior angina but started lasting longer and was associated with sharp pains. The pain would last until he would sit down and relax. It usually is brought on by activity. He denies any SOB, diaphoresis or nausea with the pain. He says that NTG dose not help the discomfort. The day of arrival, he noticed that his BP was 196 mmHg systolic and HR was 109bpm. He then took it again and the SBP was 226mmHg and he took a dose of propranolol which he takes when his HR gets above 100bpm. [facility], he was not having CP at the time but reported chest tightness intermittently throughout the day.    Hospital Course: In the ED, Initial Troponin was normal and EKG showed chronic RBBB unchanged from prior EKGs. His BP was better controlled. He was chest pain free. Given his history, he was admitted to telemetry. Cardiac enzymes were cycled and remained negative. He denied  any recurrent chest pain. He underwent ischemic evaluation by nuclear stress testing. A Lexiscan study was performed. This revealed a large fixed defect with associated hypokinesis beginning in the anterior wall of the mid ventricle extending around the true apex and into the posterolateral apical wall consistent with a region of remote infarct/ scarring. There was also felt to be a small amount of superimposed reversibility in the anterolateral wall of the mid ventricle. EF was estimated at 45%. The results were reviewed by Dr. Antoine PocheHochrein who agreed that the findings represented scar and only perhaps mild ischemia. No further testing was felt indicated. His medications were adjusted. Imdur was increased to 120 mg daily. Norvasc was also increased to 7.5 mg BID. He was last seen and evaluated by Dr. Antoine PocheHochrein who determined he was stable for discharge home. Post-hospital f/u has been arranged with Tereso NewcomerScott Weaver, PA-C, for 03/02/14. He will continue to be followed long term by Dr. Excell Seltzerooper.    Consults: None  Significant Diagnostic Studies: LHC 02/13/14  EXAM:  MYOCARDIAL IMAGING WITH SPECT (REST AND PHARMACOLOGIC-STRESS)  GATED LEFT VENTRICULAR WALL MOTION STUDY  LEFT VENTRICULAR EJECTION FRACTION  TECHNIQUE:  Standard myocardial SPECT imaging was performed after resting  intravenous injection of 10 mCi Tc-667m sestamibi. Subsequently,  intravenous infusion of Lexiscan was performed under the supervision  of the Cardiology staff. At peak effect of the drug, 30 mCi Tc-8567m  sestamibi was injected intravenously and standard myocardial SPECT  imaging was  performed. Quantitative gated imaging was also performed  to evaluate left ventricular wall motion, and estimate left  ventricular ejection fraction.  COMPARISON: Chest x-ray 02/12/2014  FINDINGS:  Perfusion: Large fixed defect in radiotracer uptake in the anterior  wall from the mid ventricle to the true apex wrapping around into  the apical  posterolateral wall. There is a suggestion of slightly  exaggerated decreased radiotracer uptake in the antral lateral wall  in the mid ventricle which may represent a small focus of  superimposed reversibility.  Wall Motion: Hypokinesis bordering on akinesis involving the  anterior wall and true apex. Hypokinesis of the posterolateral wall.  Left Ventricular Ejection Fraction: 45 %  End diastolic volume 109 ml  End systolic volume 59 ml  IMPRESSION:  1. Large fixed defect with associated hypokinesis beginning in the  anterior wall of the mid ventricle extending around the true apex  and into the posterolateral apical wall consistent with a region of  remote infarct/ scarring. There may be a small amount of  superimposed reversibility in anterolateral wall of the mid  ventricle.  2. Normal left ventricular wall motion.  3. Left ventricular ejection fraction 45%  4. Moderate-risk stress test findings*.   Treatments: See Hospital Course  Discharge Exam: Blood pressure 146/81, pulse 77, temperature 97.8 F (36.6 C), temperature source Oral, resp. rate 18, height 5\' 7"  (1.702 m), weight 135 lb 12.9 oz (61.6 kg), SpO2 99.00%.   Disposition: 01-Home or Self Care      Discharge Instructions   Diet - low sodium heart healthy    Complete by:  As directed      Increase activity slowly    Complete by:  As directed             Medication List         acetaminophen 500 MG tablet  Commonly known as:  TYLENOL  Take 1,000 mg by mouth every 6 (six) hours as needed for pain.     amLODipine 5 MG tablet  Commonly known as:  NORVASC  Take 1.5 tablets (7.5 mg total) by mouth 2 (two) times daily.     aspirin 81 MG tablet  Take 81 mg by mouth daily.     atorvastatin 10 MG tablet  Commonly known as:  LIPITOR  Take 10 mg by mouth at bedtime.     COLCRYS 0.6 MG tablet  Generic drug:  colchicine  Take 0.6 mg by mouth daily as needed (gout).     ipratropium 0.03 % nasal spray    Commonly known as:  ATROVENT  Place 1 spray into the nose daily as needed.     isosorbide mononitrate 120 MG 24 hr tablet  Commonly known as:  IMDUR  Take 1 tablet (120 mg total) by mouth daily.     losartan 100 MG tablet  Commonly known as:  COZAAR  Take 1 tablet (100 mg total) by mouth daily.     MULTIVITAMIN PO  Take 1 tablet by mouth daily.     nitroGLYCERIN 0.4 MG SL tablet  Commonly known as:  NITROSTAT  Place 0.4 mg under the tongue every 5 (five) minutes as needed for chest pain.     propranolol 10 MG tablet  Commonly known as:  INDERAL  Take 1 tablet as needed for heart rate >110     ranolazine 500 MG 12 hr tablet  Commonly known as:  RANEXA  Take 1 tablet (500 mg total) by mouth 2 (two) times daily.  Follow-up Information   Follow up with Tereso Newcomer, PA-C On 03/02/2014. (8:30 am (Dr. Earmon Phoenix PA))    Specialty:  Physician Assistant   Contact information:   [address] [place] [address] [phone]      TIME SPENT ON DISCHARGE, INCLUDING PHYSICIAN TIME: > 30 MINUTES  Signed: SIMMONS, BRITTAINY 02/13/2014, 1:50 PM  Patient seen and examined.  Plan as discussed in my rounding note for today and outlined above. Karl Stevenson  02/13/2014  1:57 PM

## 2014-02-23 ENCOUNTER — Other Ambulatory Visit: Payer: Self-pay

## 2014-03-01 ENCOUNTER — Telehealth: Payer: Self-pay | Admitting: Cardiovascular Disease

## 2014-03-01 DIAGNOSIS — I2510 Atherosclerotic heart disease of native coronary artery without angina pectoris: Secondary | ICD-10-CM

## 2014-03-01 NOTE — Telephone Encounter (Signed)
Decrease amlodipine to 7.5 mg once daily. Elevate legs. Follow-up as planned. thx

## 2014-03-01 NOTE — Telephone Encounter (Signed)
I spoke with the pt and he complained of swelling in his right leg from the knee down and swelling in his left foot.  The swelling started 2 days ago.  The pt denies CP, SOB or change in weight. The pt's BP on average at home has been 147/71. The pt's amlodipine was recently adjusted during his hospitalization.  Upon admission he was taking 5mg  once a day, this was increased to 7.5mg  twice a day at discharge.  I made the pt aware that lower extremity edema can be a side effect of Amlodipine.  I will forward this message to Dr Excell Seltzer for review and make further recommendations.  The pt is scheduled to follow up in our office 03/06/14 for an office visit.

## 2014-03-01 NOTE — Telephone Encounter (Signed)
New message      Pt states his feet are swollen so bad that he cannot get his shoes on.  Please advise

## 2014-03-02 ENCOUNTER — Encounter: Payer: Medicare PPO | Admitting: Physician Assistant

## 2014-03-02 MED ORDER — AMLODIPINE BESYLATE 5 MG PO TABS: 5.0000 mg | ORAL_TABLET | Freq: Every day | ORAL | Status: DC

## 2014-03-02 NOTE — Telephone Encounter (Signed)
I spoke with the pt and made him aware of Dr Earmon Phoenix recommendation.  The pt would like to go back to just amlodipine 5mg  daily.  I made the pt aware that he should elevate his legs when possible, monitor BP and follow up in the office next week as planned.  Pt agreed with plan.

## 2014-03-06 ENCOUNTER — Ambulatory Visit (INDEPENDENT_AMBULATORY_CARE_PROVIDER_SITE_OTHER): Payer: Commercial Managed Care - HMO | Admitting: Nurse Practitioner

## 2014-03-06 ENCOUNTER — Encounter: Payer: Self-pay | Admitting: Nurse Practitioner

## 2014-03-06 ENCOUNTER — Encounter: Payer: Medicare PPO | Admitting: Physician Assistant

## 2014-03-06 VITALS — BP 140/72 | HR 68 | Ht 67.0 in | Wt 141.0 lb

## 2014-03-06 DIAGNOSIS — I2510 Atherosclerotic heart disease of native coronary artery without angina pectoris: Secondary | ICD-10-CM

## 2014-03-06 DIAGNOSIS — I10 Essential (primary) hypertension: Secondary | ICD-10-CM

## 2014-03-06 NOTE — Progress Notes (Signed)
[name] Date of Birth: 07/04/1926 Medical Record #[id number]  History of Present Illness: Mr. Andreoli is seen back today for a post hospital visit - seen for Dr. Excell Seltzer - former patient of Dr. Ronnald Nian. He is a quite active 80 year old with CAD with past CABG in 1987, with redo in 2008 and last cath in 2014 showing patent SVG to OM1/OM2, patent SVG to LAD, patent SVG to OM3. He has an ischemic DCM EF 35-40% by echo in 2012, PVD with Left CEA and mild to moderate left subclavian stenosis and known atretic LIMA graft. He has renal artery stenosis and AAA, chronic systolic CHF, HTN, dyslipidemia, RBBB and COPD.  He has had chronic stable angina for years. Other issues include bradycardia and tachycardia with a bifascicular block but has not required PTVP implant. He uses prn propranolol if his heart rate is elevated but has not been maintained on any chronic beta blocker therapy.   Most recently presented to the hospital with chest pain/progressive angina - lasting longer than his typical angina. Went to the hospital for further evaluation - BP quite high - he ruled out for an MI by enzymes. EKG not acute - Myoview performed and felt to show scar and only perhaps mild ischemia (per Dr. Jenene Slicker review) - no further testing felt to be indicated. Imdur and Norvasc were increased.   Comes in today. Here alone. Doing ok. Rarely uses his Inderal for elevated HR - maybe uses it only once a month. BP much better -  His readings from home are reviewed and he has satisfactory control. He has cut his Norvasc back to just 5 mg a day - this was due to swelling in his feet - this is improving. He does admit to liking salt - eating canned soup. No more chest pain. Breathing ok. He is back to all of his usual activities and is happy with how he is doing. He has turned 87 as of today.  Current Outpatient Prescriptions  Medication Sig Dispense Refill  . acetaminophen (TYLENOL) 500 MG tablet Take 1,000 mg by  mouth every 6 (six) hours as needed for pain.      [name] amLODipine (NORVASC) 5 MG tablet Take 1 tablet (5 mg total) by mouth daily.  90 tablet  5  . aspirin 81 MG tablet Take 81 mg by mouth daily.       [name] atorvastatin (LIPITOR) 10 MG tablet Take 10 mg by mouth at bedtime.       . clopidogrel (PLAVIX) 75 MG tablet Take 75 mg by mouth daily.       [name] COLCRYS 0.6 MG tablet Take 0.6 mg by mouth daily as needed (gout).       [name] ipratropium (ATROVENT) 0.03 % nasal spray Place 1 spray into the nose daily as needed.       . isosorbide mononitrate (IMDUR) 120 MG 24 hr tablet Take 1 tablet (120 mg total) by mouth daily.  180 tablet  1  . losartan (COZAAR) 100 MG tablet Take 100 mg by mouth daily.      . Multiple Vitamins-Minerals (MULTIVITAMIN PO) Take 1 tablet by mouth daily.      . nitroGLYCERIN (NITROSTAT) 0.4 MG SL tablet Place 0.4 mg under the tongue every 5 (five) minutes as needed for chest pain.      . pantoprazole (PROTONIX) 40 MG tablet Take 40 mg by mouth daily.       . propranolol (INDERAL) 10 MG tablet  Take 10 mg by mouth as needed. For elevated HR over 100      . ranolazine (RANEXA) 500 MG 12 hr tablet Take 1 tablet (500 mg total) by mouth 2 (two) times daily.  60 tablet  5   No current facility-administered medications for this visit.    Allergies  Allergen Reactions  . Novocain [Procaine Hcl] Other (See Comments)    Cold sweats and very bad headache.   . Metoprolol Hives, Itching and Rash    Past Medical History  Diagnosis Date  . Hyperlipidemia   . Hypertension   . Carotid artery occlusion     s/p L CEA in 2010  . Crohn's disease     colon resection  . Bradycardia     previous intolerance to beta blockers - low dose toprol started 10/2011 for tachycardia but had rash and was discontinued  . Chronic systolic heart failure     echo 7/12: EF 35-40%, anteroseptal hypokinesis, mild MR.  . Ischemic cardiomyopathy     EF 34% (Lexiscan 06/2011), 35-40% (echo 11/2010)  . Coronary artery  disease     CABG 1987, redo 2008; Last LHC 1/10: SVG-OM 1/OM 2 patent, SVG-LAD patent, SVG-OM 3 Patent  . Peripheral vascular disease     Carotid dz s/p L CEA, RAS, AAA, LE dz  . Renal artery stenosis     Dopplers 12/2010 - 1-59% R ostial renal artery stenosis, 2 L renal arteries both widely patent  . Gouty arthritis   . AAA (abdominal aortic aneurysm)     Ectatic abdominal aorta with fusiform dilitation 3.4x3.4 and moderate thrombus burden 12/2010  . RBBB   . Tachycardia     a. 10/2011 - atrial tach vs avnrt - BB started.  . Myocardial infarction   . COPD (chronic obstructive pulmonary disease)     Past Surgical History  Procedure Laterality Date  . Carotid endarterectomy  2010    left  . Colon resection    . Tonsillectomy      as a child  . Pr vein bypass graft,aorto-fem-pop  10/1985  . Vein bypass surgery      LEFT   06/2011  . Endarterectomy  08/26/2011    Procedure: ENDARTERECTOMY ILIAC;  Surgeon: [name], MD;  Location: [facility] OR;  Service: Vascular;  Laterality: Right;  Right Femoral Artery Endarterectomy with vascu guard patch angioplasty & intraoperative arteriogram.  . Coronary artery bypass graft  10/31/85 & 08/19/06  . Cardiac catheterization  07/28/2012    Native 3v CAD, continued patency of the SVG-OM1-LAD and SVG-OM2-left PDA. LVEF 40% with multiple WMAs    History  Smoking status  . Never Smoker   Smokeless tobacco  . Never Used    History  Alcohol Use No    Family History  Problem Relation Age of Onset  . Heart disease Father   . Cancer Mother   . COPD Mother   . Anesthesia problems Neg Hx   . Deep vein thrombosis Brother   . COPD Brother   . CAD Brother     Review of Systems: The review of systems is per the HPI.  All other systems were reviewed and are negative.  Physical Exam: BP 140/72  Pulse 68  Ht 5\' 7"  (1.702 m)  Wt 141 lb (63.957 kg)  BMI 22.08 kg/m2  SpO2 100% Patient is very pleasant and in no acute distress. Skin is warm and dry.  Color is normal.  HEENT is unremarkable. Normocephalic/atraumatic. PERRL. Sclera  are nonicteric. Neck is supple. No masses. No JVD. Lungs are clear. Cardiac exam shows a regular rate and rhythm. Abdomen is soft. Extremities are with trace edema. Gait and ROM are intact. No gross neurologic deficits noted.  Wt Readings from Last 3 Encounters:  03/06/14 141 lb (63.957 kg)  02/12/14 135 lb 12.9 oz (61.6 kg)  01/02/14 130 lb 14.4 oz (59.376 kg)    LABORATORY DATA/PROCEDURES:  Lab Results  Component Value Date   WBC 4.9 02/12/2014   HGB 12.9* 02/12/2014   HCT 38.1* 02/12/2014   PLT 123* 02/12/2014   GLUCOSE 97 02/13/2014   CHOL 102 01/09/2014   TRIG 106.0 01/09/2014   HDL 33.30* 01/09/2014   LDLCALC 48 01/09/2014   ALT 16 02/13/2014   AST 20 02/13/2014   NA 142 02/13/2014   K 3.7 02/13/2014   CL 103 02/13/2014   CREATININE 1.38* 02/13/2014   BUN 24* 02/13/2014   CO2 26 02/13/2014   TSH 3.850 02/13/2014   INR 1.09 02/13/2014    BNP (last 3 results)  Recent Labs  02/13/14 0041  PROBNP 439.0   EXAM:  MYOCARDIAL IMAGING WITH SPECT (REST AND PHARMACOLOGIC-STRESS)  GATED LEFT VENTRICULAR WALL MOTION STUDY  LEFT VENTRICULAR EJECTION FRACTION  TECHNIQUE:  Standard myocardial SPECT imaging was performed after resting  intravenous injection of 10 mCi Tc-5256m sestamibi. Subsequently,  intravenous infusion of Lexiscan was performed under the supervision  of the Cardiology staff. At peak effect of the drug, 30 mCi Tc-6156m  sestamibi was injected intravenously and standard myocardial SPECT  imaging was performed. Quantitative gated imaging was also performed  to evaluate left ventricular wall motion, and estimate left  ventricular ejection fraction.  COMPARISON: Chest x-ray 02/12/2014  FINDINGS:  Perfusion: Large fixed defect in radiotracer uptake in the anterior  wall from the mid ventricle to the true apex wrapping around into  the apical posterolateral wall. There is a suggestion of slightly    exaggerated decreased radiotracer uptake in the antral lateral wall  in the mid ventricle which may represent a small focus of  superimposed reversibility.  Wall Motion: Hypokinesis bordering on akinesis involving the  anterior wall and true apex. Hypokinesis of the posterolateral wall.  Left Ventricular Ejection Fraction: 45 %  End diastolic volume 109 ml  End systolic volume 59 ml  IMPRESSION:  1. Large fixed defect with associated hypokinesis beginning in the  anterior wall of the mid ventricle extending around the true apex  and into the posterolateral apical wall consistent with a region of  remote infarct/ scarring. There may be a small amount of  superimposed reversibility in anterolateral wall of the mid  ventricle.  2. Normal left ventricular wall motion.  3. Left ventricular ejection fraction 45%  4. Moderate-risk stress test findings*.  *2012 Appropriate Use Criteria for Coronary Revascularization  Focused Update: J Am Coll Cardiol. 2012;59(9):857-881.  http://content.dementiazones.comonlinejacc.org/article.aspx?articleid=1201161  Electronically Signed  By: Malachy MoanHeath McCullough M.D.  On: 02/13/2014 11:23   Assessment / Plan: 1. CAD - managed medically - doing well clinically. Recent Myoview with scar noted and perhaps mild ischemia as reviewed by Dr. Antoine PocheHochrein - will continue with medical management. He is better clinically. Samples of Ranexa given today as well.  2. HTN - BP looks good on current therapy. He does a nice job with monitoring at home. No change in current therapy.   3. HLD - on statin therapy - labs checked by PCP.   4. PVD - followed by [facility].   5.  Tachycardia/bradycardia with bifascicular block- using prn Inderal rarely but good results. No passing out or dizzy spells reported.   I will see him back as planned in February.   Patient is agreeable to this plan and will call if any problems develop in the interim.   Rosalio Macadamia, RN, ANP-C [facility] [address] [place], [place]  [address] [phone]

## 2014-03-06 NOTE — Patient Instructions (Addendum)
HAPPY BIRTHDAY!!!  Stay on your current medicines  I will see you back as planned in February of 2016  Call the [facility] office at [phone] if you have any questions, problems or concerns.

## 2014-04-09 ENCOUNTER — Other Ambulatory Visit: Payer: Self-pay | Admitting: Cardiovascular Disease

## 2014-04-19 ENCOUNTER — Encounter (HOSPITAL_COMMUNITY): Payer: Self-pay | Admitting: Vascular Surgery

## 2014-05-30 ENCOUNTER — Other Ambulatory Visit: Payer: Self-pay

## 2014-05-30 MED ORDER — RANOLAZINE ER 500 MG PO TB12: 500.0000 mg | ORAL_TABLET | Freq: Two times a day (BID) | ORAL | Status: DC

## 2014-05-30 MED ORDER — LOSARTAN POTASSIUM 100 MG PO TABS: 100.0000 mg | ORAL_TABLET | Freq: Every day | ORAL | Status: DC

## 2014-06-26 ENCOUNTER — Ambulatory Visit: Payer: Medicare PPO | Admitting: Nurse Practitioner

## 2014-06-28 ENCOUNTER — Encounter: Payer: Self-pay | Admitting: Family

## 2014-06-29 ENCOUNTER — Encounter: Payer: Self-pay | Admitting: Family

## 2014-06-29 ENCOUNTER — Ambulatory Visit (HOSPITAL_COMMUNITY)
Admission: RE | Admit: 2014-06-29 | Discharge: 2014-06-29 | Disposition: A | Discharge status: Home / Self Care | Payer: Medicare PPO | Source: Ambulatory Visit | Attending: Family | Admitting: Family

## 2014-06-29 ENCOUNTER — Ambulatory Visit (INDEPENDENT_AMBULATORY_CARE_PROVIDER_SITE_OTHER)
Admission: RE | Admit: 2014-06-29 | Discharge: 2014-06-29 | Disposition: A | Discharge status: Home / Self Care | Payer: Medicare PPO | Source: Ambulatory Visit | Attending: Family | Admitting: Family

## 2014-06-29 ENCOUNTER — Ambulatory Visit (INDEPENDENT_AMBULATORY_CARE_PROVIDER_SITE_OTHER): Payer: Medicare PPO | Admitting: Family

## 2014-06-29 VITALS — BP 137/74 | HR 65 | Resp 16 | Ht 67.0 in | Wt 138.0 lb

## 2014-06-29 DIAGNOSIS — Z9889 Other specified postprocedural states: Secondary | ICD-10-CM | POA: Insufficient documentation

## 2014-06-29 DIAGNOSIS — I739 Peripheral vascular disease, unspecified: Secondary | ICD-10-CM

## 2014-06-29 DIAGNOSIS — Z48812 Encounter for surgical aftercare following surgery on the circulatory system: Secondary | ICD-10-CM

## 2014-06-29 DIAGNOSIS — I6523 Occlusion and stenosis of bilateral carotid arteries: Secondary | ICD-10-CM

## 2014-06-29 DIAGNOSIS — I872 Venous insufficiency (chronic) (peripheral): Secondary | ICD-10-CM

## 2014-06-29 NOTE — Addendum Note (Signed)
Addended by: Sharee Pimple on: 06/29/2014 04:43 PM   Modules accepted: Orders

## 2014-06-29 NOTE — Progress Notes (Signed)
[facility] [name] HISTORY AND PHYSICAL   MRN : [id number]  History of Present Illness:   Karl Stevenson is a 81 y.o. male patient of Dr. Imogene Burn who is s/p bilateral iliofemoral endarterectomies with patch angioplasty (Date: R 08/26/11, L 07/01/11), L CEA by Dr. Madilyn Fireman (11/20/08).  The prior R inguinal seroma has resolved. The patient's treatment regimen currently included: maximal medical management. He returns today for follow up. Toes of both feet have tingling and pain, right is worse than left, started about 2012, gradually worsening; he does not have DM, denies any known back problems. He had a CABG in 1987 with redo in 2008, veins removed from both legs for CABG.   Pt. denies claudication in legs with walking, denies non healing wounds.  Pt denies any history of stroke or TIA.  He walks a great deal, also plays golf.   Pt Diabetic: No Pt smoker: non-smoker  Pt meds include: Statin :Yes Betablocker: Yes ASA: Yes Other anticoagulants/antiplatelets: Plavix  Current Outpatient Prescriptions  Medication Sig Dispense Refill  . acetaminophen (TYLENOL) 500 MG tablet Take 1,000 mg by mouth every 6 (six) hours as needed for pain.    [name] amLODipine (NORVASC) 5 MG tablet Take 1 tablet (5 mg total) by mouth daily. 90 tablet 5  . aspirin 81 MG tablet Take 81 mg by mouth daily.     [name] atorvastatin (LIPITOR) 10 MG tablet Take 10 mg by mouth at bedtime.     . clopidogrel (PLAVIX) 75 MG tablet Take 75 mg by mouth daily.     [name] COLCRYS 0.6 MG tablet Take 0.6 mg by mouth daily as needed (gout).     [name] ipratropium (ATROVENT) 0.03 % nasal spray Place 1 spray into the nose daily as needed.     . isosorbide mononitrate (IMDUR) 120 MG 24 hr tablet Take 1 tablet (120 mg total) by mouth daily. 180 tablet 1  . losartan (COZAAR) 100 MG tablet Take 1 tablet (100 mg total) by mouth daily. 90 tablet 2  . Multiple Vitamins-Minerals (MULTIVITAMIN PO) Take 1 tablet by mouth daily.    .  nitroGLYCERIN (NITROSTAT) 0.4 MG SL tablet Place 0.4 mg under the tongue every 5 (five) minutes as needed for chest pain.    . pantoprazole (PROTONIX) 40 MG tablet Take 1 tablet (40 mg total) by mouth daily. 90 tablet 0  . propranolol (INDERAL) 10 MG tablet Take 10 mg by mouth as needed. For elevated HR over 100    . ranolazine (RANEXA) 500 MG 12 hr tablet Take 1 tablet (500 mg total) by mouth 2 (two) times daily. 180 tablet 2   No current facility-administered medications for this visit.    Past Medical History  Diagnosis Date  . Hyperlipidemia   . Hypertension   . Carotid artery occlusion     s/p L CEA in 2010  . Crohn's disease     colon resection  . Bradycardia     previous intolerance to beta blockers - low dose toprol started 10/2011 for tachycardia but had rash and was discontinued  . Chronic systolic heart failure     echo 7/12: EF 35-40%, anteroseptal hypokinesis, mild MR.  . Ischemic cardiomyopathy     EF 34% (Lexiscan 06/2011), 35-40% (echo 11/2010)  . Coronary artery disease     CABG 1987, redo 2008; Last LHC 1/10: SVG-OM 1/OM 2 patent, SVG-LAD patent, SVG-OM 3 Patent  . Peripheral vascular disease     Carotid dz s/p  L CEA, RAS, AAA, LE dz  . Renal artery stenosis     Dopplers 12/2010 - 1-59% R ostial renal artery stenosis, 2 L renal arteries both widely patent  . Gouty arthritis   . AAA (abdominal aortic aneurysm)     Ectatic abdominal aorta with fusiform dilitation 3.4x3.4 and moderate thrombus burden 12/2010  . RBBB   . Tachycardia     a. 10/2011 - atrial tach vs avnrt - BB started.  . Myocardial infarction   . COPD (chronic obstructive pulmonary disease)     Social History History  Substance Use Topics  . Smoking status: Never Smoker   . Smokeless tobacco: Never Used  . Alcohol Use: No    Family History Family History  Problem Relation Age of Onset  . Heart disease Father   . Cancer Mother   . COPD Mother   . Anesthesia problems Neg Hx   . Deep vein  thrombosis Brother   . COPD Brother   . CAD Brother     Surgical History Past Surgical History  Procedure Laterality Date  . Carotid endarterectomy  2010    left  . Colon resection    . Tonsillectomy      as a child  . Pr vein bypass graft,aorto-fem-pop  10/1985  . Vein bypass surgery      LEFT   06/2011  . Endarterectomy  08/26/2011    Procedure: ENDARTERECTOMY ILIAC;  Surgeon: [name], MD;  Location: [facility];  Service: Vascular;  Laterality: Right;  Right Femoral Artery Endarterectomy with vascu guard patch angioplasty & intraoperative arteriogram.  . Coronary artery bypass graft  10/31/85 & 08/19/06  . Cardiac catheterization  07/28/2012    Native 3v CAD, continued patency of the SVG-OM1-LAD and SVG-OM2-left PDA. LVEF 40% with multiple WMAs  . Abdominal aortagram N/A 06/11/2011    Procedure: ABDOMINAL [name];  Surgeon: [name], MD;  Location: [facility] CATH LAB;  Service: Cardiovascular;  Laterality: N/A;  . Left heart catheterization with coronary angiogram N/A 07/28/2012    Procedure: LEFT HEART CATHETERIZATION WITH CORONARY ANGIOGRAM;  Surgeon: [name], MD;  Location: [facility] CATH LAB;  Service: Cardiovascular;  Laterality: N/A;    Allergies  Allergen Reactions  . Novocain [Procaine Hcl] Other (See Comments)    Cold sweats and very bad headache.   . Metoprolol Hives, Itching and Rash    Current Outpatient Prescriptions  Medication Sig Dispense Refill  . acetaminophen (TYLENOL) 500 MG tablet Take 1,000 mg by mouth every 6 (six) hours as needed for pain.    [name] amLODipine (NORVASC) 5 MG tablet Take 1 tablet (5 mg total) by mouth daily. 90 tablet 5  . aspirin 81 MG tablet Take 81 mg by mouth daily.     [name] atorvastatin (LIPITOR) 10 MG tablet Take 10 mg by mouth at bedtime.     . clopidogrel (PLAVIX) 75 MG tablet Take 75 mg by mouth daily.     [name] COLCRYS 0.6 MG tablet Take 0.6 mg by mouth daily as needed (gout).     [name] ipratropium (ATROVENT) 0.03 % nasal spray Place 1 spray into  the nose daily as needed.     . isosorbide mononitrate (IMDUR) 120 MG 24 hr tablet Take 1 tablet (120 mg total) by mouth daily. 180 tablet 1  . losartan (COZAAR) 100 MG tablet Take 1 tablet (100 mg total) by mouth daily. 90 tablet 2  . Multiple Vitamins-Minerals (MULTIVITAMIN PO) Take 1 tablet by mouth daily.    [name]  nitroGLYCERIN (NITROSTAT) 0.4 MG SL tablet Place 0.4 mg under the tongue every 5 (five) minutes as needed for chest pain.    . pantoprazole (PROTONIX) 40 MG tablet Take 1 tablet (40 mg total) by mouth daily. 90 tablet 0  . propranolol (INDERAL) 10 MG tablet Take 10 mg by mouth as needed. For elevated HR over 100    . ranolazine (RANEXA) 500 MG 12 hr tablet Take 1 tablet (500 mg total) by mouth 2 (two) times daily. 180 tablet 2   No current facility-administered medications for this visit.     REVIEW OF SYSTEMS: See HPI for pertinent positives and negatives.  Physical Examination  Filed Vitals:   06/29/14 1057 06/29/14 1059  BP: 132/74 137/74  Pulse: 61 65  Resp:  16  Height:   (1.702 m)  Weight:  138 lb (62.596 kg)  SpO2:  100%  Body mass index is 21.61 kg/(m^2).    General: A&O x 3, elderly but fit appearing  Pulmonary: Sym exp, good air movt, CTAB, no rales, rhonchi, & wheezing   Cardiac: RRR, Nl S1, S2, no detected murmur  Vascular:  Vessel  Right  Left   Radial  2+Palpable  2+Palpable   Carotid  Palpable, without bruit  Palpable, without bruit   Aorta  Non-palpable  N/A   Femoral  2+Palpable  2+Palpable   Popliteal  2+palpable  2+ palpable   PT  2+Palpable  2+Palpable   DP  2+Palpable  2+Palpable    Musculoskeletal: M/S 4/5 throughout , Extremities without ischemic changes, right ankle pitting edema at 2+, bilateral trace pretibial pitting edema  Neurologic: Pain and light touch intact in extremities , Motor exam as listed above            Non-Invasive Vascular Imaging (06/29/2014):  CEREBROVASCULAR DUPLEX  EVALUATION    INDICATION: Carotid endarterectomy     PREVIOUS INTERVENTION(S): Left carotid endarterectomy on 11/20/08    DUPLEX EXAM:     RIGHT  LEFT  Peak Systolic Velocities (cm/s) End Diastolic Velocities (cm/s) Plaque LOCATION Peak Systolic Velocities (cm/s) End Diastolic Velocities (cm/s) Plaque  59 10  CCA PROXIMAL 56 13   74 15  CCA MID 95 20   79 18 HT CCA DISTAL 91 21   154 13 HT ECA 163 13   94 18  ICA PROXIMAL 87 18   83 22  ICA MID 79 21   65 19  ICA DISTAL 60 21     1.2 ICA / CCA Ratio (PSV) Not Calculated  Antegrade Vertebral Flow Antegrade  117 Brachial Systolic Pressure (mmHg) 119  Multiphasic (subclavian artery) Brachial Artery Waveforms Multiphasic (subclavian artery)    Plaque Morphology:  HM = Homogeneous, HT = Heterogeneous, CP = Calcific Plaque, SP = Smooth Plaque, IP = Irregular Plaque     ADDITIONAL FINDINGS: No significant stenosis of the bilateral external or common carotid arteries.    IMPRESSION: Patent left carotid endarterectomy site with no bilateral internal carotid artery stenoses noted.    Compared to the previous exam:  No significant change in Doppler velocities noted when compared to the previous exam on 06/23/13.     ABI (Date: 06/29/2014)  R: 1.29 (06/23/13, 1.16), DP: triphasic, PT: triphasic, TBI: 0.73  L: 1.18 (1.09), DP: triphasic, PT: triphasic, TBI: 0.71   ASSESSMENT:  KRISTIN LAMAGNA is a 81 y.o. male  who is s/p bilateral iliofemoral endarterectomies with patch angioplasty (Date: R 08/26/11, L 07/01/11), L CEA by Dr. Madilyn Fireman (11/20/08).  He has no claudication symptoms with walking, no tissue loss in his feet or legs. He has no history of stroke or TIA. Today's carotid Duplex reveals a patent left carotid endarterectomy site with no bilateral internal carotid artery stenoses noted. No significant change in Doppler velocities noted when compared to the previous exam on 06/23/13.  ABI's remain normal with all triphasic  waveforms.   PLAN:   Based on today's exam and non-invasive vascular lab results, the patient will follow up in 1 year with the following tests ABI's, B carotid duplex. I discussed in depth with the patient the nature of atherosclerosis, and emphasized the importance of maximal medical management including strict control of blood pressure, blood glucose, and lipid levels, obtaining regular exercise, and cessation of smoking.  The patient is aware that without maximal medical management the underlying atherosclerotic disease process will progress, limiting the benefit of any interventions.  The patient was given information about stroke prevention and what symptoms should prompt the patient to seek immediate medical care.  The patient was given information about PAD including signs, symptoms, treatment, what symptoms should prompt the patient to seek immediate medical care, and risk reduction measures to take. Thank you for allowing [place] to participate in this patient's care.  Charisse March, RN, MSN, FNP-C [facility] Office: [phone]  Clinic MD: Imogene Burn 06/29/2014 10:54 AM

## 2014-06-29 NOTE — Patient Instructions (Signed)

## 2014-07-11 ENCOUNTER — Other Ambulatory Visit: Payer: Self-pay | Admitting: Cardiovascular Disease

## 2014-07-12 ENCOUNTER — Other Ambulatory Visit: Payer: Self-pay

## 2014-07-24 ENCOUNTER — Ambulatory Visit (INDEPENDENT_AMBULATORY_CARE_PROVIDER_SITE_OTHER): Payer: Medicare PPO | Admitting: Nurse Practitioner

## 2014-07-24 ENCOUNTER — Encounter: Payer: Self-pay | Admitting: Nurse Practitioner

## 2014-07-24 VITALS — BP 160/70 | HR 99 | Ht 67.0 in | Wt 136.1 lb

## 2014-07-24 DIAGNOSIS — I2510 Atherosclerotic heart disease of native coronary artery without angina pectoris: Secondary | ICD-10-CM

## 2014-07-24 DIAGNOSIS — E785 Hyperlipidemia, unspecified: Secondary | ICD-10-CM

## 2014-07-24 DIAGNOSIS — I739 Peripheral vascular disease, unspecified: Secondary | ICD-10-CM

## 2014-07-24 DIAGNOSIS — I10 Essential (primary) hypertension: Secondary | ICD-10-CM

## 2014-07-24 MED ORDER — PROPRANOLOL HCL 10 MG PO TABS: 10.0000 mg | ORAL_TABLET | ORAL | Status: DC | PRN

## 2014-07-24 NOTE — Progress Notes (Signed)
CARDIOLOGY OFFICE NOTE  Date:  07/24/2014    Karl Stevenson Date of Birth: 09-Mar-1927 Medical Record #[id number]  PCP:  Garlan Fillers, MD  Cardiologist:  Excell Seltzer    Chief Complaint  Patient presents with  . Coronary Artery Disease    Follow up visit - seen for Dr. Excell Seltzer     History of Present Illness: Karl Stevenson is a 81 y.o. male who presents today for a follow up visit. He is seen for Dr. Excell Seltzer - former patient of Dr. Ronnald Nian. He is a quite active 81 year old with CAD with past CABG in 1987, with redo in 2008 and last cath in 2014 showing patent SVG to OM1/OM2, patent SVG to LAD, patent SVG to OM3. He has an ischemic DCM EF 35-40% by echo in 2012, PVD with Left CEA and mild to moderate left subclavian stenosis and known atretic LIMA graft. He has renal artery stenosis and AAA, chronic systolic CHF, HTN, dyslipidemia, RBBB and COPD. He has had chronic stable angina for years. Other issues include bradycardia and tachycardia with a bifascicular block but has not required PTVP implant. He uses prn propranolol if his heart rate is elevated but has not been maintained on any chronic beta blocker therapy.   Presented to the hospital back in the fall with chest pain/progressive angina - lasting longer than his typical angina. Went to the hospital for further evaluation - BP quite high - he ruled out for an MI by enzymes. EKG not acute - Myoview performed and felt to show scar and only perhaps mild ischemia (per Dr. Jenene Slicker review) - no further testing felt to be indicated. Imdur and Norvasc were increased.   I saw him back in October on his birthday after that hospitalization - was doing much better and BP improved. He had actually cut his Norvasc back due to edema. Trying to restrict his salt.   Comes in today. Here alone. Doing ok. Had his physical with his PCP in January - brought his labs in for review. Labs looked good. He is doing well. Played golf last week. No chest  pain. Breathing is good. Has used his prn Inderal about 3 times since last visit - needs refill. No syncope. Not dizzy or lightheaded. He is pleased with how he is doing. He stopped recording his BP. Notes his last check was 128/70 and it was 98/58 at Dr. Silvano Rusk.    Past Medical History  Diagnosis Date  . Hyperlipidemia   . Hypertension   . Carotid artery occlusion     s/p L CEA in 2010  . Crohn's disease     colon resection  . Bradycardia     previous intolerance to beta blockers - low dose toprol started 10/2011 for tachycardia but had rash and was discontinued  . Chronic systolic heart failure     echo 7/12: EF 35-40%, anteroseptal hypokinesis, mild MR.  . Ischemic cardiomyopathy     EF 34% (Lexiscan 06/2011), 35-40% (echo 11/2010)  . Coronary artery disease     CABG 1987, redo 2008; Last LHC 1/10: SVG-OM 1/OM 2 patent, SVG-LAD patent, SVG-OM 3 Patent  . Peripheral vascular disease     Carotid dz s/p L CEA, RAS, AAA, LE dz  . Renal artery stenosis     Dopplers 12/2010 - 1-59% R ostial renal artery stenosis, 2 L renal arteries both widely patent  . Gouty arthritis   . AAA (abdominal aortic aneurysm)  Ectatic abdominal aorta with fusiform dilitation 3.4x3.4 and moderate thrombus burden 12/2010  . RBBB   . Tachycardia     a. 10/2011 - atrial tach vs avnrt - BB started.  . Myocardial infarction   . COPD (chronic obstructive pulmonary disease)     Past Surgical History  Procedure Laterality Date  . Carotid endarterectomy  2010    left  . Colon resection    . Tonsillectomy      as a child  . Pr vein bypass graft,aorto-fem-pop  10/1985  . Vein bypass surgery      LEFT   06/2011  . Endarterectomy  08/26/2011    Procedure: ENDARTERECTOMY ILIAC;  Surgeon: [name], MD;  Location: [facility] OR;  Service: Vascular;  Laterality: Right;  Right Femoral Artery Endarterectomy with vascu guard patch angioplasty & intraoperative arteriogram.  . Coronary artery bypass graft  10/31/85 & 08/19/06    . Cardiac catheterization  07/28/2012    Native 3v CAD, continued patency of the SVG-OM1-LAD and SVG-OM2-left PDA. LVEF 40% with multiple WMAs  . Abdominal aortagram N/A 06/11/2011    Procedure: ABDOMINAL [name];  Surgeon: [name], MD;  Location: [facility] CATH LAB;  Service: Cardiovascular;  Laterality: N/A;  . Left heart catheterization with coronary angiogram N/A 07/28/2012    Procedure: LEFT HEART CATHETERIZATION WITH CORONARY ANGIOGRAM;  Surgeon: [name], MD;  Location[facility] C[facility] LAB;  Service: Cardiovascular;  Laterality: N/A;     Medications: Current Outpatient Prescriptions  Medication Sig Dispense Refill  . acetaminophen (TYLENOL) 500 MG tablet Take 1,000 mg by mouth every 6 (six) hours as needed for pain.    [name] amLODipine (NORVASC) 5 MG tablet Take 1 tablet (5 mg total) by mouth daily. 90 tablet 5  . aspirin 81 MG tablet Take 81 mg by mouth daily.     [name] atorvastatin (LIPITOR) 10 MG tablet Take 10 mg by mouth at bedtime.     . clopidogrel (PLAVIX) 75 MG tablet Take 75 mg by mouth daily.     [name] COLCRYS 0.6 MG tablet Take 0.6 mg by mouth daily as needed (gout).     [name] ipratropium (ATROVENT) 0.03 % nasal spray Place 1 spray into the nose daily as needed.     . isosorbide mononitrate (IMDUR) 120 MG 24 hr tablet Take 1 tablet (120 mg total) by mouth daily. 180 tablet 1  . losartan (COZAAR) 100 MG tablet Take 1 tablet (100 mg total) by mouth daily. 90 tablet 2  . Multiple Vitamins-Minerals (MULTIVITAMIN PO) Take 1 tablet by mouth daily.    . nitroGLYCERIN (NITROSTAT) 0.4 MG SL tablet Place 0.4 mg under the tongue every 5 (five) minutes as needed for chest pain.    . pantoprazole (PROTONIX) 40 MG tablet TAKE ONE TABLET BY MOUTH ONCE DAILY 90 tablet 0  . propranolol (INDERAL) 10 MG tablet Take 1 tablet (10 mg total) by mouth as needed. For elevated HR over 100 15 tablet 6  . ranolazine (RANEXA) 500 MG 12 hr tablet Take 1 tablet (500 mg total) by mouth 2 (two) times daily. 180 tablet 2    No current facility-administered medications for this visit.    Allergies: Allergies  Allergen Reactions  . Novocain [Procaine Hcl] Other (See Comments)    Cold sweats and very bad headache.   . Metoprolol Hives, Itching and Rash    Social History: The patient  reports that he has never smoked. He has never used smokeless tobacco. He reports that he  does not drink alcohol or use illicit drugs.   Family History: The patient's family history includes CAD in his brother; COPD in his brother and mother; Cancer in his mother; Deep vein thrombosis in his brother; Heart disease in his father. There is no history of Anesthesia problems.   Review of Systems: Please see the history of present illness.   Otherwise, the review of systems is positive for .   All other systems are reviewed and negative.   Physical Exam: VS:  BP 160/70 mmHg  Pulse 99  Ht 5\' 7"  (1.702 m)  Wt 136 lb 1.9 oz (61.744 kg)  BMI 21.31 kg/m2  SpO2 100% .  BMI Body mass index is 21.31 kg/(m^2).  Wt Readings from Last 3 Encounters:  07/24/14 136 lb 1.9 oz (61.744 kg)  06/29/14 138 lb (62.596 kg)  03/06/14 141 lb (63.957 kg)    General: Pleasant. Well developed, well nourished and in no acute distress.  HEENT: Normal. Neck: Supple, no JVD, carotid bruits, or masses noted.  Cardiac: Regular rate and rhythm. No murmurs, rubs, or gallops. No edema.  Respiratory:  Lungs are clear to auscultation bilaterally with normal work of breathing.  GI: Soft and nontender.  MS: No deformity or atrophy. Gait and ROM intact. Skin: Warm and dry. Color is normal.  Neuro:  Strength and sensation are intact and no gross focal deficits noted.  Psych: Alert, appropriate and with normal affect.   LABORATORY DATA:  EKG:  EKG is not ordered today.   Lab Results  Component Value Date   WBC 4.9 02/12/2014   HGB 12.9* 02/12/2014   HCT 38.1* 02/12/2014   PLT 123* 02/12/2014   GLUCOSE 97 02/13/2014   CHOL 102 01/09/2014   TRIG  106.0 01/09/2014   HDL 33.30* 01/09/2014   LDLCALC 48 01/09/2014   ALT 16 02/13/2014   AST 20 02/13/2014   NA 142 02/13/2014   K 3.7 02/13/2014   CL 103 02/13/2014   CREATININE 1.38* 02/13/2014   BUN 24* 02/13/2014   CO2 26 02/13/2014   TSH 3.850 02/13/2014   INR 1.09 02/13/2014    BNP (last 3 results) No results for input(s): BNP in the last 8760 hours.  ProBNP (last 3 results)  Recent Labs  02/13/14 0041  PROBNP 439.0     Other Studies Reviewed Today:  MYOCARDIAL IMAGING WITH SPECT (REST AND PHARMACOLOGIC-STRESS)  GATED LEFT VENTRICULAR WALL MOTION STUDY  LEFT VENTRICULAR EJECTION FRACTION  TECHNIQUE:  Standard myocardial SPECT imaging was performed after resting  intravenous injection of 10 mCi Tc-59m sestamibi. Subsequently,  intravenous infusion of Lexiscan was performed under the supervision  of the Cardiology staff. At peak effect of the drug, 30 mCi Tc-68m  sestamibi was injected intravenously and standard myocardial SPECT  imaging was performed. Quantitative gated imaging was also performed  to evaluate left ventricular wall motion, and estimate left  ventricular ejection fraction.  COMPARISON: Chest x-ray 02/12/2014  FINDINGS:  Perfusion: Large fixed defect in radiotracer uptake in the anterior  wall from the mid ventricle to the true apex wrapping around into  the apical posterolateral wall. There is a suggestion of slightly  exaggerated decreased radiotracer uptake in the antral lateral wall  in the mid ventricle which may represent a small focus of  superimposed reversibility.  Wall Motion: Hypokinesis bordering on akinesis involving the  anterior wall and true apex. Hypokinesis of the posterolateral wall.  Left Ventricular Ejection Fraction: 45 %  End diastolic volume 109 ml  End systolic volume 59 ml   IMPRESSION:  1. Large fixed defect with associated hypokinesis beginning in the  anterior wall of the mid ventricle  extending around the true apex  and into the posterolateral apical wall consistent with a region of  remote infarct/ scarring. There may be a small amount of  superimposed reversibility in anterolateral wall of the mid  ventricle.  2. Normal left ventricular wall motion.  3. Left ventricular ejection fraction 45%  4. Moderate-risk stress test findings*.  *2012 Appropriate Use Criteria for Coronary Revascularization  Focused Update: J Am Coll Cardiol. 2012;59(9):857-881.  http://content.dementiazones.com.aspx?articleid=[email or web address]  Electronically Signed  By: Malachy Moan M.D.  On: 02/13/2014 11:23   Assessment / Plan: 1. CAD - managed medically - doing well clinically. Recent Myoview with scar noted and perhaps mild ischemia as reviewed by Dr. Antoine Poche - will continue with medical management. He is doing well clinically with no symptoms.  2. HTN - BP up here today but better at home and even low with Dr. Eloise Harman. Will leave on his current regimen. Continue to monitor at home.  No change in current therapy.   3. HLD - on statin therapy - labs checked by PCP. He brought this in for review and they look great.   4. PVD - followed by [facility].   5. Tachycardia/bradycardia with bifascicular block- using prn Inderal rarely but good results. No passing out or dizzy spells reported.   Current medicines are reviewed with the patient today.  The patient does not have concerns regarding medicines other than what has been noted above.  The following changes have been made:  See above.  Labs/ tests ordered today include:   No orders of the defined types were placed in this encounter.   Disposition:   FU with Dr. Excell Seltzer in  4 months  Patient is agreeable to this plan and will call if any problems develop in the interim.   Signed: Rosalio Macadamia, RN, ANP-C 07/24/2014 11:07 AM  [facility] [address] [place], [place]   [address] Phone: [phone] Fax: [phone]

## 2014-07-24 NOTE — Patient Instructions (Addendum)
See Dr. Excell Seltzer in 4 months  Stay on current medicines  I refilled the propranolol today  Keep a check on your blood pressure  Stay active.  Call the [facility] office at [phone] if you have any questions, problems or concerns.

## 2014-09-18 DIAGNOSIS — H18413 Arcus senilis, bilateral: Secondary | ICD-10-CM | POA: Diagnosis not present

## 2014-09-18 DIAGNOSIS — H02839 Dermatochalasis of unspecified eye, unspecified eyelid: Secondary | ICD-10-CM | POA: Diagnosis not present

## 2014-09-18 DIAGNOSIS — Z961 Presence of intraocular lens: Secondary | ICD-10-CM | POA: Diagnosis not present

## 2014-10-05 ENCOUNTER — Other Ambulatory Visit: Payer: Self-pay | Admitting: Cardiovascular Disease

## 2014-11-05 ENCOUNTER — Other Ambulatory Visit: Payer: Self-pay

## 2014-11-13 DIAGNOSIS — H7201 Central perforation of tympanic membrane, right ear: Secondary | ICD-10-CM | POA: Diagnosis not present

## 2014-11-13 DIAGNOSIS — H6121 Impacted cerumen, right ear: Secondary | ICD-10-CM | POA: Diagnosis not present

## 2014-11-14 ENCOUNTER — Other Ambulatory Visit (INDEPENDENT_AMBULATORY_CARE_PROVIDER_SITE_OTHER): Payer: Medicare PPO | Admitting: *Deleted

## 2014-11-14 ENCOUNTER — Encounter: Payer: Self-pay | Admitting: Cardiovascular Disease

## 2014-11-14 ENCOUNTER — Ambulatory Visit (INDEPENDENT_AMBULATORY_CARE_PROVIDER_SITE_OTHER): Payer: Medicare PPO | Admitting: Cardiovascular Disease

## 2014-11-14 ENCOUNTER — Telehealth: Payer: Self-pay | Admitting: Cardiovascular Disease

## 2014-11-14 VITALS — BP 120/76 | HR 89 | Ht 67.0 in | Wt 137.8 lb

## 2014-11-14 DIAGNOSIS — R079 Chest pain, unspecified: Secondary | ICD-10-CM | POA: Diagnosis not present

## 2014-11-14 DIAGNOSIS — I5022 Chronic systolic (congestive) heart failure: Secondary | ICD-10-CM | POA: Diagnosis not present

## 2014-11-14 LAB — TROPONIN I: TNIDX: 0.03 ug/l (ref 0.00–0.06)

## 2014-11-14 MED ORDER — PROPRANOLOL HCL ER 80 MG PO CP24: 80.0000 mg | ORAL_CAPSULE | Freq: Every day | ORAL | Status: DC

## 2014-11-14 NOTE — Telephone Encounter (Signed)
New MEssage  Ptc/o of chest tightness and some pain-   Pt c/o of Chest Pain: 1. Are you having CP right now?  Tightness right nw 2. Are you experiencing any other symptoms (ex. SOB, nausea, vomiting, sweating)? no 3. How long have you been experiencing CP? 1 week  4. Is your CP continuous or coming and going? Comes and goes- mostly at night 5. Have you taken Nitroglycerin? Yes- can not tell if it actulally helps   

## 2014-11-14 NOTE — Progress Notes (Signed)
Cardiology Office Note Date:  11/14/2014   ID:  Karl Stevenson, DOB 05/23/1926, MRN [id number]  PCP:  Garlan Fillers, MD  Cardiologist:  [name], MD    Chief Complaint  Patient presents with  . Chest Pain    History of Present Illness: Karl Stevenson is a 81 y.o. male who presents for a relation of chest pain. The patient has extensive CAD and has undergone redo CABG in 2008. His last heart catheterization in 2014 showed patency of the sequential vein graft to OM1 and OM 2, vein graft to LAD, and vein graft OM 3. LVEF has been moderately depressed in the range of 35-40%. He has vascular disease and has undergone left carotid endarterectomy. He also has a history of bifascicular block and episodes of intermittent tachycardia but he has not required permanent pacing.  He describes tightness in his chest on and off for 2-3 weeks. He feels mild tightness as we speak today but denies 'pain.' There are no associated symptoms and he denies shortness of breath, nausea, vomiting, or diaphoresis. There is no exertional component. He has slept well but has awakened from sleep with a 'fast pulse.'    Past Medical History  Diagnosis Date  . Hyperlipidemia   . Hypertension   . Carotid artery occlusion     s/p L CEA in 2010  . Crohn's disease     colon resection  . Bradycardia     previous intolerance to beta blockers - low dose toprol started 10/2011 for tachycardia but had rash and was discontinued  . Chronic systolic heart failure     echo 7/12: EF 35-40%, anteroseptal hypokinesis, mild MR.  . Ischemic cardiomyopathy     EF 34% (Lexiscan 06/2011), 35-40% (echo 11/2010)  . Coronary artery disease     CABG 1987, redo 2008; Last LHC 1/10: SVG-OM 1/OM 2 patent, SVG-LAD patent, SVG-OM 3 Patent  . Peripheral vascular disease     Carotid dz s/p L CEA, RAS, AAA, LE dz  . Renal artery stenosis     Dopplers 12/2010 - 1-59% R ostial renal artery stenosis, 2 L renal arteries both widely  patent  . Gouty arthritis   . AAA (abdominal aortic aneurysm)     Ectatic abdominal aorta with fusiform dilitation 3.4x3.4 and moderate thrombus burden 12/2010  . RBBB   . Tachycardia     a. 10/2011 - atrial tach vs avnrt - BB started.  . Myocardial infarction   . COPD (chronic obstructive pulmonary disease)     Past Surgical History  Procedure Laterality Date  . Carotid endarterectomy  2010    left  . Colon resection    . Tonsillectomy      as a child  . Pr vein bypass graft,aorto-fem-pop  10/1985  . Vein bypass surgery      LEFT   06/2011  . Endarterectomy  08/26/2011    Procedure: ENDARTERECTOMY ILIAC;  Surgeon: [name], MD;  Location: [facility] OR;  Service: Vascular;  Laterality: Right;  Right Femoral Artery Endarterectomy with vascu guard patch angioplasty & intraoperative arteriogram.  . Coronary artery bypass graft  10/31/85 & 08/19/06  . Cardiac catheterization  07/28/2012    Native 3v CAD, continued patency of the SVG-OM1-LAD and SVG-OM2-left PDA. LVEF 40% with multiple WMAs  . Abdominal aortagram N/A 06/11/2011    Procedure: ABDOMINAL [name];  Surgeon: [name], MD;  Location: [facility] CATH LAB;  Service: Cardiovascular;  Laterality: N/A;  . Left heart catheterization  with coronary angiogram N/A 07/28/2012    Procedure: LEFT HEART CATHETERIZATION WITH CORONARY ANGIOGRAM;  Surgeon: [name], MD;  Location: [facility] CATH LAB;  Service: Cardiovascular;  Laterality: N/A;    Current Outpatient Prescriptions  Medication Sig Dispense Refill  . acetaminophen (TYLENOL) 500 MG tablet Take 1,000 mg by mouth every 6 (six) hours as needed for pain.    [name] amLODipine (NORVASC) 5 MG tablet Take 1 tablet (5 mg total) by mouth daily. 90 tablet 5  . aspirin 81 MG tablet Take 81 mg by mouth daily.     [name] atorvastatin (LIPITOR) 10 MG tablet Take 10 mg by mouth at bedtime.     . clopidogrel (PLAVIX) 75 MG tablet Take 75 mg by mouth daily.     [name] COLCRYS 0.6 MG tablet Take 0.6 mg by mouth daily as  needed (gout).     [name] ipratropium (ATROVENT) 0.03 % nasal spray Place 1 spray into the nose daily as needed.     . isosorbide mononitrate (IMDUR) 120 MG 24 hr tablet Take 1 tablet (120 mg total) by mouth daily. 180 tablet 1  . losartan (COZAAR) 100 MG tablet Take 1 tablet (100 mg total) by mouth daily. 90 tablet 2  . Multiple Vitamins-Minerals (MULTIVITAMIN PO) Take 1 tablet by mouth daily.    . nitroGLYCERIN (NITROSTAT) 0.4 MG SL tablet Place 0.4 mg under the tongue every 5 (five) minutes as needed for chest pain.    . pantoprazole (PROTONIX) 40 MG tablet TAKE ONE TABLET BY MOUTH ONCE DAILY 90 tablet 0  . propranolol (INDERAL) 10 MG tablet Take 1 tablet (10 mg total) by mouth as needed. For elevated HR over 100 15 tablet 6  . ranolazine (RANEXA) 500 MG 12 hr tablet Take 1 tablet (500 mg total) by mouth 2 (two) times daily. 180 tablet 2   No current facility-administered medications for this visit.    Allergies:   Novocain and Metoprolol   Social History:  The patient  reports that he has never smoked. He has never used smokeless tobacco. He reports that he does not drink alcohol or use illicit drugs.   Family History:  The patient's  family history includes CAD in his brother; COPD in his brother and mother; Cancer in his mother; Deep vein thrombosis in his brother; Heart disease in his father. There is no history of Anesthesia problems.    ROS:  Please see the history of present illness.  Otherwise, review of systems is positive for tightness and chest pressure.  All other systems are reviewed and negative.    PHYSICAL EXAM: VS:  BP 120/76 mmHg  Pulse 89  Ht  (1.702 m)  Wt 137 lb 12.8 oz (62.506 kg)  BMI 21.58 kg/m2 , BMI Body mass index is 21.58 kg/(m^2). GEN: Well nourished, well developed, pleasant elderly male in no acute distress HEENT: normal Neck: no JVD, no masses. No carotid bruits Cardiac: RRR without murmur or gallop                Respiratory:  clear to auscultation  bilaterally, normal work of breathing GI: soft, nontender, nondistended, + BS MS: no deformity or atrophy Ext: no pretibial edema Skin: warm and dry, no rash Neuro:  Strength and sensation are intact Psych: euthymic mood, full affect  EKG:  EKG is ordered today. The ekg ordered today shows Sinus tach 1010 bpm, RBBB, age-indeterminate anterior MI  Recent Labs: 02/12/2014: Hemoglobin 12.9*; Platelets 123* 02/13/2014: ALT 16; BUN 24*;  Creatinine, Ser 1.38*; Magnesium 1.9; Potassium 3.7; Pro B Natriuretic peptide (BNP) 439.0; Sodium 142; TSH 3.850   Lipid Panel     Component Value Date/Time   CHOL 102 01/09/2014 0920   TRIG 106.0 01/09/2014 0920   HDL 33.30* 01/09/2014 0920   CHOLHDL 3 01/09/2014 0920   VLDL 21.2 01/09/2014 0920   LDLCALC 48 01/09/2014 0920      Wt Readings from Last 3 Encounters:  11/14/14 137 lb 12.8 oz (62.506 kg)  07/24/14 136 lb 1.9 oz (61.744 kg)  06/29/14 138 lb (62.596 kg)     Cardiac Studies Reviewed: Myoview 02/13/2014: IMPRESSION: 1. Large fixed defect with associated hypokinesis beginning in the anterior wall of the mid ventricle extending around the true apex and into the posterolateral apical wall consistent with a region of remote infarct/ scarring. There may be a small amount of superimposed reversibility in anterolateral wall of the mid ventricle.  2. Normal left ventricular wall motion.  3. Left ventricular ejection fraction 45%  4. Moderate-risk stress test findings*.  Cardiac Cath 07/28/2012: Procedural Findings: Hemodynamics:  AO 116/52 LV 118/8  Coronary angiography: Coronary dominance: right  Left mainstem: Severely diseased with 95% stenosis.  Left anterior descending (LAD): Total occlusion of the proximal vessel.  Left circumflex (LCx): Total occlusion of the proximal vessel.  Right coronary artery (RCA): Small, nondominant vessel. No obstructive disease noted  Saphenous vein graft to first OM: The  graft is patent. There is 60% smooth stenosis in the distal body of the graft. The target vessel is small.  Y. graft to mid LAD arising from body of the vein graft to OM: This graft is widely patent with no significant areas of disease. The LAD is extremely small in caliber and diffusely diseased. The apical LAD has 95% stenosis. The first diagonal is moderate in caliber and fills retrograde from the LAD graft. There is 70% stenosis in the diagonal.  Sequential vein graft to OM 2 and left PDA: Widely patent without significant stenosis. Both target vessels are large in caliber and have no significant disease.  The LIMA to LAD is known to be atretic and it was not injected today.  Left ventriculography: There is severe hypokinesis of the mid and distal anterior walls. There is akinesis of the true apex. There is akinesis of the infero-apex. The basal and midinferior walls in the basal anterior wall contract normally. The estimated left ventricular ejection fraction is 40%.  Final Conclusions:  1. Severe native three-vessel coronary artery disease 2. Continued patency of the saphenous vein graft to first OM and saphenous vein graft to LAD and saphenous vein graft sequence to OM 2 and left PDA 3. Moderate segmental left ventricular systolic dysfunction.  Recommendations: The patient's coronary anatomy is stable. I compared his films to his previous study from 2010. I would recommend continued medical therapy.  ASSESSMENT AND PLAN: 1.  CAD, native vessel, with anginal symptoms (typical and atypical features). I think his angina may be driven by tachycardia which has been an issue in the past. His coronary anatomy is known and he most recently had patent bypass grafts with distal vessel stenoses. Troponin is checked and it is normal. Recommend add Inderal LA (he tolerates propranolol prn and has not tolerated metoprolol). Follow-up Norma Fredrickson next month as scheduled. He will call or seek medical  attention if symptoms worsen. There may a compoonent of anxiety as well.  2. HTN: BP controlled. Beta-blocker will be added as above.  3. Hyperlipidemia: followed  by PCP on statin drug  4. PAD: stable symptoms. Followed by vascular surgery.   Current medicines are reviewed with the patient today.  The patient does not have concerns regarding medicines.  Labs/ tests ordered today include:  No orders of the defined types were placed in this encounter.    Disposition:   FU as above  Signed, [name], MD  11/14/2014 11:50 AM    [facility] [address] [place], [place], [place]  [address] Phone: [phone]; Fax: [phone]

## 2014-11-14 NOTE — Telephone Encounter (Signed)
Received direct call from scheduling and spoke with pt. He reports chest tightness for a little over a week.  Worse episode was 2 weekends ago. Thought about going to ED but then felt better. Tightness occurs daily. Has taken NTG but does not think it makes much difference in tightness. Never completely goes away. Usually tight but sometimes is sharp and between breast bone. Is able to do all normal activities.  Walking to mailbox does not make pain worse. Activities such as working on the computer make it worse. Reports he is sleeping OK and sometimes does not notice pain.  Last episode started this AM when he got up.  Thinks it started after eating breakfast.  No shortness of breath. No cough. Blood pressure on July 3 and 4 was 175/91,170/97,162/95.  Will review with Dr. Excell Seltzer. Pt aware if symptoms worsen he should call EMS or go to ED

## 2014-11-14 NOTE — Patient Instructions (Signed)
Medication Instructions:  Your physician has recommended you make the following change in your medication:  1. START Inderal LA 80mg  take one by mouth daily  Labwork: No new orders.   Testing/Procedures: No new orders.   Follow-Up: Your physician recommends that you schedule a follow-up appointment with Norma Fredrickson NP  Any Other Special Instructions Will Be Listed Below (If Applicable).

## 2014-11-14 NOTE — Telephone Encounter (Signed)
Dr. Excell Seltzer would like to see pt today in office at 12:15 and have pt come in prior to appt for stat troponin if he is able.  I spoke with pt and gave him this information. He will come to office now for lab work and see Dr. Excell Seltzer at 12:15 today.

## 2014-11-19 ENCOUNTER — Telehealth: Payer: Self-pay | Admitting: Cardiovascular Disease

## 2014-11-19 DIAGNOSIS — I5022 Chronic systolic (congestive) heart failure: Secondary | ICD-10-CM

## 2014-11-19 DIAGNOSIS — R079 Chest pain, unspecified: Secondary | ICD-10-CM

## 2014-11-19 NOTE — Telephone Encounter (Addendum)
Spoke with patient who states he is having diarrhea since Friday right after he started Inderal 80 mg.  Patient states they are capsules and cannot be divided in half.  He reports BP 157/72, HR 47 bpm this morning.  States having loose stool 1-2 times per day.  He states the chest tightness has not improved since starting Inderal.  He denies SOB, states he does not feel "bad" just having loose stool and low heart rate.  Denies abdominal pain. Denies weakness, light-headedness; states he is drinking fluids.  I advised patient that I will review his concerns with APP covering for Dr. Excell Seltzer and will call him back with their advice.  He verbalized understanding and agreement.

## 2014-11-19 NOTE — Telephone Encounter (Signed)
I reviewed patient's complaint with Dr. Johney Frame, office DOD who advised patient hold Inderal 80 mg for 24 hours and call back to report his heart rate. He advised if HR is improved, patient should switch to Inderal 40 mg.  I advised patient to hold Inderal tomorrow as he has already taken his dose today.  I advised him to call the office tomorrow afternoon to report his heart rate so that we can advise accordingly.  I advised that the loose stool may improve with the decreased dose.  He has been tried on multiple therapies to manage chest pain/tachycardia and HTN.  Patient verbalized understanding and agreement.

## 2014-11-19 NOTE — Telephone Encounter (Signed)
New message    Patient calling  Pt C/O medication issue:  1. Name of Medication: new medication  Propranolol 80 mg   2. How are you currently taking this medication (dosage and times per day)? Once a day   3. Are you having a reaction (difficulty breathing--STAT)? Diarrhea,    4. What is your medication issue? Heart rate running low

## 2014-11-20 NOTE — Telephone Encounter (Signed)
Follow Up  Pt c/o medication issue:  1. Name of Medication: Propranolol 80 mg    4. What is your medication issue? Pt called states that he was placed on a medication his heart rate was running low and he was advised to take not take the medication and call in with the reading   Pulse of 54.

## 2014-11-20 NOTE — Telephone Encounter (Signed)
Patient has a BP of 159/72 HR 54. Went to [employer] PA for clarification of note below and an order if needed. He recommends patient to stay off Propranolol one more day and call tomorrow with his HR and BP. If his HR has improve to start the propranolol 40 mg. Patient will call tomorrow with an up date. Patient also stated his BMs are better.

## 2014-11-21 MED ORDER — PROPRANOLOL HCL 20 MG PO TABS: ORAL_TABLET | ORAL | Status: DC

## 2014-11-21 NOTE — Telephone Encounter (Signed)
Calling in follow up with BP and HR readings.  This AM HR was 61; later morning was 141/83 HR 82; and while speaking with him took again; 171/95 HR 90. States he does feel better.  Spoke w/[employer] who had been consulted yesterday.  He advised to start Propranolol LA 40 mg daily. Advised pt will send Rx into [facility] on [address].  He has follow up with Sunday Spillers in August 2.  He verbalizes understanding.

## 2014-11-21 NOTE — Telephone Encounter (Signed)
Follow up      Patient calling to give bp and HR information 11-21-14 141/83 and HR 82 Pt states he is feeling good

## 2014-11-26 ENCOUNTER — Telehealth: Payer: Self-pay | Admitting: Nurse Practitioner

## 2014-11-26 NOTE — Telephone Encounter (Signed)
New message     Calling to give update on pulse HR 52 this am; sun--HR 50; now HR is 47

## 2014-11-26 NOTE — Telephone Encounter (Signed)
Please let him know that the lower heart rate is the result of the new medicine he was placed on (placed on so he would not have the fast heart beat).  As long as he is NOT having any symptoms - not dizzy, lightheaded, feeling like he will pass out, etc a heart rate in the low 50's is ok.   If his heart rate stays in the 40's - he needs to let us know

## 2014-11-26 NOTE — Telephone Encounter (Signed)
Called pt back about Heart Rate stated no symptoms.  Pt stated thought hr would come up and it hasn't. Stated I would FYI Norma Fredrickson, NP but she is in a meeting will Wednesday. Pt stated understanding.  Will route to [place].

## 2014-11-27 ENCOUNTER — Other Ambulatory Visit: Payer: Self-pay | Admitting: *Deleted

## 2014-11-27 MED ORDER — PROPRANOLOL HCL 10 MG PO TABS: 10.0000 mg | ORAL_TABLET | Freq: Two times a day (BID) | ORAL | Status: DC

## 2014-11-27 NOTE — Telephone Encounter (Signed)
S/w pt is not having any symptoms stated the new medication Dr. Excell Seltzer put pt on can't pt just take one dose a day?  Also stated after pt rest awhile pt's hr is in the 40"s with no symptoms stated to monitor hr and call back on Friday. Pt is agreeable to plan.  Will Karl Stevenson.

## 2014-11-27 NOTE — Telephone Encounter (Signed)
He was put on Inderal LA 80 mg to take just one a day - is this what he is doing?  If so, he can try cutting in half.

## 2014-11-27 NOTE — Telephone Encounter (Signed)
S/w pt stated was taking ( 80 mg ) inderal daily, Heart rate was so low switched to ( 20 mg ) inderal daily. Pt is now going to take Inderal ( 10 mg ) bid and will call Friday to let me know what's going on with Heart rate.  Pt's has medication at home and plenty of refills.  Pt also has ov with Lawson Fiscal G in two weeks.  Will change on medication list and Alphia Moh.

## 2014-11-30 ENCOUNTER — Telehealth: Payer: Self-pay | Admitting: Cardiovascular Disease

## 2014-11-30 NOTE — Telephone Encounter (Signed)
Follow up     Pt calling in regards propanolol 10 mg 2 times a day Pt states having issues with heart rate Pt Heart rate on July 20 was 53; July 21 was 56; July 22 was 79 Please call to discuss

## 2014-11-30 NOTE — Telephone Encounter (Signed)
S/w pt stated stay on current regimen.  Will see pt back for f/u 8/2

## 2014-11-30 NOTE — Telephone Encounter (Deleted)
ERROR

## 2014-12-11 ENCOUNTER — Ambulatory Visit (INDEPENDENT_AMBULATORY_CARE_PROVIDER_SITE_OTHER): Payer: Medicare PPO | Admitting: Nurse Practitioner

## 2014-12-11 ENCOUNTER — Encounter: Payer: Self-pay | Admitting: Nurse Practitioner

## 2014-12-11 VITALS — BP 130/68 | HR 71 | Ht 67.0 in | Wt 138.0 lb

## 2014-12-11 DIAGNOSIS — R0989 Other specified symptoms and signs involving the circulatory and respiratory systems: Secondary | ICD-10-CM

## 2014-12-11 DIAGNOSIS — E785 Hyperlipidemia, unspecified: Secondary | ICD-10-CM

## 2014-12-11 DIAGNOSIS — R030 Elevated blood-pressure reading, without diagnosis of hypertension: Secondary | ICD-10-CM

## 2014-12-11 DIAGNOSIS — IMO0001 Reserved for inherently not codable concepts without codable children: Secondary | ICD-10-CM

## 2014-12-11 DIAGNOSIS — I2510 Atherosclerotic heart disease of native coronary artery without angina pectoris: Secondary | ICD-10-CM | POA: Diagnosis not present

## 2014-12-11 MED ORDER — PROPRANOLOL HCL 10 MG PO TABS: 10.0000 mg | ORAL_TABLET | Freq: Two times a day (BID) | ORAL | Status: DC

## 2014-12-11 NOTE — Patient Instructions (Addendum)
We will be checking the following labs today - NONE   Medication Instructions:    Continue with your current medicines.   I sent in your refill for the 10 mg of Inderal    Testing/Procedures To Be Arranged:  N/A  Follow-Up:   See me in 4 months    Other Special Instructions:   N/A  Call the [facility] office at [phone] if you have any questions, problems or concerns.

## 2014-12-11 NOTE — Progress Notes (Signed)
CARDIOLOGY OFFICE NOTE  Date:  12/11/2014    Karl Stevenson Date of Birth: 19-Aug-1926 Medical Record #[id number]  PCP:  Garlan Fillers, MD  Cardiologist:  Excell Seltzer    Chief Complaint  Patient presents with  . Coronary Artery Disease    Follow up visit - seen for Dr. Excell Seltzer    History of Present Illness: Karl Stevenson is a 81 y.o. male who presents today for a follow up visit. Seen for Dr. Excell Seltzer. He has CAD with past CABG in 1987, with redo in 2008 and last cath in 2014 showing patent SVG to OM1/OM2, patent SVG to LAD, patent SVG to OM3. He has an ischemic DCM EF 35-40% by echo in 2012, PVD with Left CEA and mild to moderate left subclavian stenosis and known atretic LIMA graft. He has renal artery stenosis and AAA, chronic systolic CHF, HTN, dyslipidemia, RBBB and COPD. He has had remote bout of colitis/Chron's and had to be hospitalized. He has had chronic stable angina for years. Other issues include bradycardia and tachycardia with a bifascicular block but has not required PTVP implant. He previously used prn propranolol if his heart rate is elevated and had not been maintained on any chronic beta blocker therapy.   Presented to the hospital last fall with chest pain/progressive angina - lasting longer than his typical angina. Went to the hospital for further evaluation - BP quite high - he ruled out for an MI by enzymes. EKG not acute - Myoview performed and felt to show scar and only perhaps mild ischemia (per Dr. Jenene Slicker review) - no further testing felt to be indicated. Imdur and Norvasc were increased.   I saw him back in October on his birthday after that hospitalization - was doing much better and BP improved. He had actually cut his Norvasc back due to edema. Trying to restrict his salt. Last seen by me back in March and he was doing well.   Saw Dr. Excell Seltzer back in early July - endorsed some chest pain with tachycardia - started on Inderal - has had lots of dosing  changes due to bradycardia. I ended up putting him just on 10 mg BID.  Comes in today. Here alone. Notes that overall he feels better. He has had some gas and diarrhea - wondering where that was coming from. He forgot that he has had Chron's. GI symptoms are improving regardless. Not dizzy. No chest pain. Still walking but not as much due to heat.  Past Medical History  Diagnosis Date  . Hyperlipidemia   . Hypertension   . Carotid artery occlusion     s/p L CEA in 2010  . Crohn's disease     colon resection  . Bradycardia     previous intolerance to beta blockers - low dose toprol started 10/2011 for tachycardia but had rash and was discontinued  . Chronic systolic heart failure     echo 7/12: EF 35-40%, anteroseptal hypokinesis, mild MR.  . Ischemic cardiomyopathy     EF 34% (Lexiscan 06/2011), 35-40% (echo 11/2010)  . Coronary artery disease     CABG 1987, redo 2008; Last LHC 1/10: SVG-OM 1/OM 2 patent, SVG-LAD patent, SVG-OM 3 Patent  . Peripheral vascular disease     Carotid dz s/p L CEA, RAS, AAA, LE dz  . Renal artery stenosis     Dopplers 12/2010 - 1-59% R ostial renal artery stenosis, 2 L renal arteries both widely patent  . Gouty arthritis   .  AAA (abdominal aortic aneurysm)     Ectatic abdominal aorta with fusiform dilitation 3.4x3.4 and moderate thrombus burden 12/2010  . RBBB   . Tachycardia     a. 10/2011 - atrial tach vs avnrt - BB started.  . Myocardial infarction   . COPD (chronic obstructive pulmonary disease)     Past Surgical History  Procedure Laterality Date  . Carotid endarterectomy  2010    left  . Colon resection    . Tonsillectomy      as a child  . Pr vein bypass graft,aorto-fem-pop  10/1985  . Vein bypass surgery      LEFT   06/2011  . Endarterectomy  08/26/2011    Procedure: ENDARTERECTOMY ILIAC;  Surgeon: [name], MD;  Location: [facility] OR;  Service: Vascular;  Laterality: Right;  Right Femoral Artery Endarterectomy with vascu guard patch angioplasty &  intraoperative arteriogram.  . Coronary artery bypass graft  10/31/85 & 08/19/06  . Cardiac catheterization  07/28/2012    Native 3v CAD, continued patency of the SVG-OM1-LAD and SVG-OM2-left PDA. LVEF 40% with multiple WMAs  . Abdominal aortagram N/A 06/11/2011    Procedure: ABDOMINAL [name];  Surgeon: [name], MD;  Location: [facility] CATH LAB;  Service: Cardiovascular;  Laterality: N/A;  . Left heart catheterization with coronary angiogram N/A 07/28/2012    Procedure: LEFT HEART CATHETERIZATION WITH CORONARY ANGIOGRAM;  Surgeon: [name], MD;  Location: [facility] CATH LAB;  Service: Cardiovascular;  Laterality: N/A;     Medications: Current Outpatient Prescriptions  Medication Sig Dispense Refill  . acetaminophen (TYLENOL) 500 MG tablet Take 1,000 mg by mouth every 6 (six) hours as needed for pain.    [name] amLODipine (NORVASC) 5 MG tablet Take 1 tablet (5 mg total) by mouth daily. 90 tablet 5  . aspirin 81 MG tablet Take 81 mg by mouth daily.     [name] atorvastatin (LIPITOR) 10 MG tablet Take 10 mg by mouth at bedtime.     . clopidogrel (PLAVIX) 75 MG tablet Take 75 mg by mouth daily.     [name] COLCRYS 0.6 MG tablet Take 0.6 mg by mouth daily as needed (gout).     [name] ipratropium (ATROVENT) 0.03 % nasal spray Place 1 spray into the nose daily as needed.     . isosorbide mononitrate (IMDUR) 120 MG 24 hr tablet Take 1 tablet (120 mg total) by mouth daily. 180 tablet 1  . losartan (COZAAR) 100 MG tablet Take 1 tablet (100 mg total) by mouth daily. 90 tablet 2  . Multiple Vitamins-Minerals (MULTIVITAMIN PO) Take 1 tablet by mouth daily.    . nitroGLYCERIN (NITROSTAT) 0.4 MG SL tablet Place 0.4 mg under the tongue every 5 (five) minutes as needed for chest pain.    . pantoprazole (PROTONIX) 40 MG tablet TAKE ONE TABLET BY MOUTH ONCE DAILY 90 tablet 0  . propranolol (INDERAL) 10 MG tablet Take 1 tablet (10 mg total) by mouth 2 (two) times daily. 30 tablet 6  . ranolazine (RANEXA) 500 MG 12 hr tablet Take 1  tablet (500 mg total) by mouth 2 (two) times daily. 180 tablet 2   No current facility-administered medications for this visit.    Allergies: Allergies  Allergen Reactions  . Novocain [Procaine Hcl] Other (See Comments)    Cold sweats and very bad headache.   . Metoprolol Hives, Itching and Rash    Social History: The patient  reports that he has never smoked. He has never used smokeless tobacco.  He reports that he does not drink alcohol or use illicit drugs.   Family History: The patient's family history includes CAD in his brother; COPD in his brother and mother; Cancer in his mother; Deep vein thrombosis in his brother; Heart disease in his father. There is no history of Anesthesia problems.   Review of Systems: Please see the history of present illness.   Otherwise, the review of systems is positive for none.   All other systems are reviewed and negative.   Physical Exam: VS:  BP 130/68 mmHg  Pulse 71  Ht 5\' 7"  (1.702 m)  Wt 138 lb (62.596 kg)  BMI 21.61 kg/m2  SpO2 93% .  BMI Body mass index is 21.61 kg/(m^2).  Wt Readings from Last 3 Encounters:  12/11/14 138 lb (62.596 kg)  11/14/14 137 lb 12.8 oz (62.506 kg)  07/24/14 136 lb 1.9 oz (61.744 kg)    General: Pleasant. Well developed, well nourished and in no acute distress.  HEENT: Normal. Neck: Supple, no JVD, carotid bruits, or masses noted.  Cardiac: Regular rate and rhythm. No murmurs, rubs, or gallops. No edema.  Respiratory:  Lungs are clear to auscultation bilaterally with normal work of breathing.  GI: Soft and nontender.  MS: No deformity or atrophy. Gait and ROM intact. Skin: Warm and dry. Color is normal.  Neuro:  Strength and sensation are intact and no gross focal deficits noted.  Psych: Alert, appropriate and with normal affect.   LABORATORY DATA:  EKG:  EKG is not ordered today.   Lab Results  Component Value Date   WBC 4.9 02/12/2014   HGB 12.9* 02/12/2014   HCT 38.1* 02/12/2014   PLT 123*  02/12/2014   GLUCOSE 97 02/13/2014   CHOL 102 01/09/2014   TRIG 106.0 01/09/2014   HDL 33.30* 01/09/2014   LDLCALC 48 01/09/2014   ALT 16 02/13/2014   AST 20 02/13/2014   NA 142 02/13/2014   K 3.7 02/13/2014   CL 103 02/13/2014   CREATININE 1.38* 02/13/2014   BUN 24* 02/13/2014   CO2 26 02/13/2014   TSH 3.850 02/13/2014   INR 1.09 02/13/2014    BNP (last 3 results) No results for input(s): BNP in the last 8760 hours.  ProBNP (last 3 results)  Recent Labs  02/13/14 0041  PROBNP 439.0     Other Studies Reviewed Today:  MYOCARDIAL IMAGING WITH SPECT (REST AND PHARMACOLOGIC-STRESS)  GATED LEFT VENTRICULAR WALL MOTION STUDY  LEFT VENTRICULAR EJECTION FRACTION  TECHNIQUE:  Standard myocardial SPECT imaging was performed after resting  intravenous injection of 10 mCi Tc-47m sestamibi. Subsequently,  intravenous infusion of Lexiscan was performed under the supervision  of the Cardiology staff. At peak effect of the drug, 30 mCi Tc-34m  sestamibi was injected intravenously and standard myocardial SPECT  imaging was performed. Quantitative gated imaging was also performed  to evaluate left ventricular wall motion, and estimate left  ventricular ejection fraction.  COMPARISON: Chest x-ray 02/12/2014  FINDINGS:  Perfusion: Large fixed defect in radiotracer uptake in the anterior  wall from the mid ventricle to the true apex wrapping around into  the apical posterolateral wall. There is a suggestion of slightly  exaggerated decreased radiotracer uptake in the antral lateral wall  in the mid ventricle which may represent a small focus of  superimposed reversibility.  Wall Motion: Hypokinesis bordering on akinesis involving the  anterior wall and true apex. Hypokinesis of the posterolateral wall.  Left Ventricular Ejection Fraction: 45 %  End diastolic  volume 109 ml  End systolic volume 59 ml   IMPRESSION:  1. Large fixed defect with associated  hypokinesis beginning in the  anterior wall of the mid ventricle extending around the true apex  and into the posterolateral apical wall consistent with a region of  remote infarct/ scarring. There may be a small amount of  superimposed reversibility in anterolateral wall of the mid  ventricle.  2. Normal left ventricular wall motion.  3. Left ventricular ejection fraction 45%  4. Moderate-risk stress test findings*.   By: Malachy Moan M.D.  On: 02/13/2014 11:23   Assessment / Plan: 1. CAD - managed medically - doing well clinically. Recent Myoview with scar noted and perhaps mild ischemia as reviewed by Dr. Antoine Poche - will continue with medical management. He is doing well clinically with no symptoms.  2. HTN - BP looks good on his current regimen.   3. HLD - on statin therapy - labs checked by PCP.   4. PVD - followed by [facility].   5. Tachycardia/bradycardia with bifascicular block- now on very low dose Inderal and taking BID - this has not causes too much issue now with bradycardia and his BP is ok. May still end up with PPM at some point, but for now, I think he is doing ok.  Current medicines are reviewed with the patient today.  The patient does not have concerns regarding medicines other than what has been noted above.  The following changes have been made:  See above.  Labs/ tests ordered today include:   No orders of the defined types were placed in this encounter.     Disposition:   FU with me in 4 months.  Patient is agreeable to this plan and will call if any problems develop in the interim.   Signed: Rosalio Macadamia, RN, ANP-C 12/11/2014 10:50 AM  [facility] [address] [place], [place]  [address] Phone: [phone] Fax: [phone]

## 2015-01-01 ENCOUNTER — Other Ambulatory Visit: Payer: Self-pay | Admitting: Cardiovascular Disease

## 2015-01-14 ENCOUNTER — Other Ambulatory Visit: Payer: Self-pay | Admitting: Cardiovascular Disease

## 2015-01-15 DIAGNOSIS — Z23 Encounter for immunization: Secondary | ICD-10-CM | POA: Diagnosis not present

## 2015-01-18 NOTE — Telephone Encounter (Signed)
ERROR

## 2015-02-20 ENCOUNTER — Other Ambulatory Visit: Payer: Self-pay | Admitting: Cardiovascular Disease

## 2015-02-21 ENCOUNTER — Telehealth: Payer: Self-pay | Admitting: Nurse Practitioner

## 2015-02-21 ENCOUNTER — Other Ambulatory Visit: Payer: Self-pay | Admitting: *Deleted

## 2015-02-21 DIAGNOSIS — R002 Palpitations: Secondary | ICD-10-CM

## 2015-02-21 NOTE — Telephone Encounter (Signed)
Did he feel bad? Better now?  If ok, would just continue to monitor for now.

## 2015-02-21 NOTE — Telephone Encounter (Signed)
New message      Pt has had increased heart rate within the last 2 weeks.  It has been as high as 148.  Now he is normal.  Want Lori to know.  Please advise

## 2015-02-21 NOTE — Telephone Encounter (Signed)
S/w pt hasn't had a episode of elevated Hr from August 2 till September 30.  October 1st HR was 152 pt took inderal, ususally goes to sleep with elevated HR, accompanied by feeling sick and tired,  wakes up in the middle of night to check HR and is usually still elevated than in the am HR comes down. Oct 8th another episode HR 142 and than again yesterday HR 138 woke up in the am HR 55.  Pt states how long can HR stay elevated till it should come down??  Feeling Ok now but anxious.  Stated will FYI Norma Fredrickson, NP, to advise.

## 2015-02-21 NOTE — Telephone Encounter (Signed)
Needs an event monitor put on.

## 2015-02-21 NOTE — Telephone Encounter (Signed)
Pt was agreeable to plan.  Will come in on October 18 for event monitor.  Appointment made, orders in

## 2015-02-25 ENCOUNTER — Other Ambulatory Visit: Payer: Self-pay | Admitting: Nurse Practitioner

## 2015-02-25 DIAGNOSIS — R002 Palpitations: Secondary | ICD-10-CM

## 2015-02-25 DIAGNOSIS — R000 Tachycardia, unspecified: Secondary | ICD-10-CM

## 2015-02-26 ENCOUNTER — Ambulatory Visit (INDEPENDENT_AMBULATORY_CARE_PROVIDER_SITE_OTHER): Payer: Medicare PPO

## 2015-02-26 DIAGNOSIS — R002 Palpitations: Secondary | ICD-10-CM | POA: Diagnosis not present

## 2015-02-26 DIAGNOSIS — R0989 Other specified symptoms and signs involving the circulatory and respiratory systems: Secondary | ICD-10-CM | POA: Diagnosis not present

## 2015-02-26 DIAGNOSIS — R000 Tachycardia, unspecified: Secondary | ICD-10-CM | POA: Diagnosis not present

## 2015-03-02 ENCOUNTER — Other Ambulatory Visit: Payer: Self-pay | Admitting: Cardiovascular Disease

## 2015-03-02 ENCOUNTER — Other Ambulatory Visit: Payer: Self-pay | Admitting: Cardiology

## 2015-03-05 ENCOUNTER — Telehealth: Payer: Self-pay | Admitting: *Deleted

## 2015-03-05 ENCOUNTER — Other Ambulatory Visit (INDEPENDENT_AMBULATORY_CARE_PROVIDER_SITE_OTHER): Payer: Medicare PPO | Admitting: *Deleted

## 2015-03-05 ENCOUNTER — Encounter (HOSPITAL_COMMUNITY): Payer: Self-pay | Admitting: *Deleted

## 2015-03-05 ENCOUNTER — Emergency Department (HOSPITAL_COMMUNITY): Payer: Medicare PPO

## 2015-03-05 ENCOUNTER — Inpatient Hospital Stay (HOSPITAL_COMMUNITY)
Admission: EM | Admit: 2015-03-05 | Discharge: 2015-03-13 | DRG: 287 | Disposition: A | Discharge status: Home Health | Payer: Medicare PPO | Attending: Cardiology | Admitting: Cardiology

## 2015-03-05 DIAGNOSIS — I472 Ventricular tachycardia, unspecified: Secondary | ICD-10-CM

## 2015-03-05 DIAGNOSIS — Z7982 Long term (current) use of aspirin: Secondary | ICD-10-CM | POA: Diagnosis not present

## 2015-03-05 DIAGNOSIS — M109 Gout, unspecified: Secondary | ICD-10-CM | POA: Diagnosis present

## 2015-03-05 DIAGNOSIS — I2582 Chronic total occlusion of coronary artery: Secondary | ICD-10-CM | POA: Diagnosis not present

## 2015-03-05 DIAGNOSIS — I5022 Chronic systolic (congestive) heart failure: Secondary | ICD-10-CM | POA: Diagnosis not present

## 2015-03-05 DIAGNOSIS — I252 Old myocardial infarction: Secondary | ICD-10-CM | POA: Diagnosis not present

## 2015-03-05 DIAGNOSIS — I701 Atherosclerosis of renal artery: Secondary | ICD-10-CM | POA: Diagnosis present

## 2015-03-05 DIAGNOSIS — N183 Chronic kidney disease, stage 3 unspecified: Secondary | ICD-10-CM | POA: Diagnosis present

## 2015-03-05 DIAGNOSIS — Z7902 Long term (current) use of antithrombotics/antiplatelets: Secondary | ICD-10-CM | POA: Diagnosis not present

## 2015-03-05 DIAGNOSIS — I255 Ischemic cardiomyopathy: Secondary | ICD-10-CM | POA: Diagnosis not present

## 2015-03-05 DIAGNOSIS — E785 Hyperlipidemia, unspecified: Secondary | ICD-10-CM | POA: Diagnosis present

## 2015-03-05 DIAGNOSIS — I2510 Atherosclerotic heart disease of native coronary artery without angina pectoris: Secondary | ICD-10-CM | POA: Diagnosis present

## 2015-03-05 DIAGNOSIS — Z951 Presence of aortocoronary bypass graft: Secondary | ICD-10-CM | POA: Diagnosis present

## 2015-03-05 DIAGNOSIS — I25810 Atherosclerosis of coronary artery bypass graft(s) without angina pectoris: Secondary | ICD-10-CM | POA: Diagnosis not present

## 2015-03-05 DIAGNOSIS — Z79899 Other long term (current) drug therapy: Secondary | ICD-10-CM | POA: Diagnosis not present

## 2015-03-05 DIAGNOSIS — I714 Abdominal aortic aneurysm, without rupture: Secondary | ICD-10-CM | POA: Diagnosis present

## 2015-03-05 DIAGNOSIS — R000 Tachycardia, unspecified: Secondary | ICD-10-CM

## 2015-03-05 DIAGNOSIS — I471 Supraventricular tachycardia: Secondary | ICD-10-CM | POA: Diagnosis present

## 2015-03-05 DIAGNOSIS — K5090 Crohn's disease, unspecified, without complications: Secondary | ICD-10-CM | POA: Diagnosis present

## 2015-03-05 DIAGNOSIS — I499 Cardiac arrhythmia, unspecified: Secondary | ICD-10-CM | POA: Diagnosis not present

## 2015-03-05 DIAGNOSIS — I452 Bifascicular block: Secondary | ICD-10-CM | POA: Diagnosis not present

## 2015-03-05 DIAGNOSIS — I130 Hypertensive heart and chronic kidney disease with heart failure and stage 1 through stage 4 chronic kidney disease, or unspecified chronic kidney disease: Secondary | ICD-10-CM | POA: Diagnosis not present

## 2015-03-05 DIAGNOSIS — I4729 Other ventricular tachycardia: Secondary | ICD-10-CM

## 2015-03-05 DIAGNOSIS — I739 Peripheral vascular disease, unspecified: Secondary | ICD-10-CM | POA: Diagnosis present

## 2015-03-05 DIAGNOSIS — I2584 Coronary atherosclerosis due to calcified coronary lesion: Secondary | ICD-10-CM | POA: Diagnosis not present

## 2015-03-05 DIAGNOSIS — I10 Essential (primary) hypertension: Secondary | ICD-10-CM | POA: Diagnosis present

## 2015-03-05 DIAGNOSIS — I4510 Unspecified right bundle-branch block: Secondary | ICD-10-CM | POA: Diagnosis present

## 2015-03-05 DIAGNOSIS — I4891 Unspecified atrial fibrillation: Secondary | ICD-10-CM | POA: Diagnosis not present

## 2015-03-05 DIAGNOSIS — J449 Chronic obstructive pulmonary disease, unspecified: Secondary | ICD-10-CM | POA: Diagnosis present

## 2015-03-05 DIAGNOSIS — I6529 Occlusion and stenosis of unspecified carotid artery: Secondary | ICD-10-CM | POA: Diagnosis present

## 2015-03-05 DIAGNOSIS — I25118 Atherosclerotic heart disease of native coronary artery with other forms of angina pectoris: Secondary | ICD-10-CM | POA: Diagnosis not present

## 2015-03-05 HISTORY — DX: Ventricular tachycardia: I47.2

## 2015-03-05 HISTORY — DX: Gout, unspecified: M10.9

## 2015-03-05 HISTORY — DX: Ventricular tachycardia, unspecified: I47.20

## 2015-03-05 LAB — PROTIME-INR
INR: 1.17 (ref 0.00–1.49)
PROTHROMBIN TIME: 15.1 s (ref 11.6–15.2)

## 2015-03-05 LAB — CBC
HCT: 37.1 % — ABNORMAL LOW (ref 39.0–52.0)
Hemoglobin: 12.4 g/dL — ABNORMAL LOW (ref 13.0–17.0)
MCH: 33.2 pg (ref 26.0–34.0)
MCHC: 33.4 g/dL (ref 30.0–36.0)
MCV: 99.5 fL (ref 78.0–100.0)
PLATELETS: 145 10*3/uL — AB (ref 150–400)
RBC: 3.73 MIL/uL — AB (ref 4.22–5.81)
RDW: 13 % (ref 11.5–15.5)
WBC: 5.9 10*3/uL (ref 4.0–10.5)

## 2015-03-05 LAB — I-STAT CHEM 8, ED
BUN: 23 mg/dL — ABNORMAL HIGH (ref 6–20)
CHLORIDE: 103 mmol/L (ref 101–111)
CREATININE: 1.4 mg/dL — AB (ref 0.61–1.24)
Calcium, Ion: 1.19 mmol/L (ref 1.13–1.30)
Glucose, Bld: 101 mg/dL — ABNORMAL HIGH (ref 65–99)
HEMATOCRIT: 39 % (ref 39.0–52.0)
Hemoglobin: 13.3 g/dL (ref 13.0–17.0)
POTASSIUM: 4.1 mmol/L (ref 3.5–5.1)
SODIUM: 142 mmol/L (ref 135–145)
TCO2: 22 mmol/L (ref 0–100)

## 2015-03-05 LAB — BASIC METABOLIC PANEL
BUN: 22 mg/dL (ref 7–25)
CO2: 26 mmol/L (ref 20–31)
Calcium: 9.3 mg/dL (ref 8.6–10.3)
Chloride: 105 mmol/L (ref 98–110)
Creat: 1.35 mg/dL — ABNORMAL HIGH (ref 0.70–1.11)
Glucose, Bld: 111 mg/dL — ABNORMAL HIGH (ref 65–99)
Potassium: 3.9 mmol/L (ref 3.5–5.3)
Sodium: 139 mmol/L (ref 135–146)

## 2015-03-05 LAB — I-STAT TROPONIN, ED
TROPONIN I, POC: 0.02 ng/mL (ref 0.00–0.08)
Troponin i, poc: 0.01 ng/mL (ref 0.00–0.08)

## 2015-03-05 LAB — MAGNESIUM
Magnesium: 1.5 mg/dL (ref 1.5–2.5)
Magnesium: 1.6 mg/dL — ABNORMAL LOW (ref 1.7–2.4)

## 2015-03-05 LAB — PHOSPHORUS: PHOSPHORUS: 3.3 mg/dL (ref 2.5–4.6)

## 2015-03-05 LAB — APTT: APTT: 31 s (ref 24–37)

## 2015-03-05 MED ORDER — CLOPIDOGREL BISULFATE 75 MG PO TABS
75.0000 mg | ORAL_TABLET | Freq: Every day | ORAL | Status: DC
  Administered 2015-03-06 – 2015-03-13 (×8): 75 mg via ORAL
  Filled 2015-03-05 (×8): qty 1

## 2015-03-05 MED ORDER — COLCHICINE 0.6 MG PO TABS: 0.6000 mg | ORAL_TABLET | Freq: Every day | ORAL | Status: DC | PRN

## 2015-03-05 MED ORDER — RANOLAZINE ER 500 MG PO TB12
500.0000 mg | ORAL_TABLET | Freq: Two times a day (BID) | ORAL | Status: DC
  Administered 2015-03-05 – 2015-03-08 (×6): 500 mg via ORAL
  Filled 2015-03-05 (×6): qty 1

## 2015-03-05 MED ORDER — AMIODARONE HCL IN DEXTROSE 360-4.14 MG/200ML-% IV SOLN
60.0000 mg/h | INTRAVENOUS | Status: DC
  Administered 2015-03-05 (×2): 60 mg/h via INTRAVENOUS

## 2015-03-05 MED ORDER — AMIODARONE HCL IN DEXTROSE 360-4.14 MG/200ML-% IV SOLN
30.0000 mg/h | INTRAVENOUS | Status: DC
  Administered 2015-03-06: 30 mg/h via INTRAVENOUS
  Filled 2015-03-05: qty 200

## 2015-03-05 MED ORDER — ISOSORBIDE MONONITRATE ER 60 MG PO TB24
120.0000 mg | ORAL_TABLET | Freq: Every day | ORAL | Status: DC
  Administered 2015-03-07 – 2015-03-13 (×7): 120 mg via ORAL
  Filled 2015-03-05 (×8): qty 2

## 2015-03-05 MED ORDER — ONDANSETRON HCL 4 MG/2ML IJ SOLN: 4.0000 mg | Freq: Four times a day (QID) | INTRAMUSCULAR | Status: DC | PRN

## 2015-03-05 MED ORDER — NITROGLYCERIN 0.4 MG SL SUBL: 0.4000 mg | SUBLINGUAL_TABLET | SUBLINGUAL | Status: DC | PRN

## 2015-03-05 MED ORDER — ASPIRIN EC 81 MG PO TBEC
81.0000 mg | DELAYED_RELEASE_TABLET | Freq: Every day | ORAL | Status: DC
  Administered 2015-03-06 – 2015-03-07 (×2): 81 mg via ORAL
  Filled 2015-03-05 (×5): qty 1

## 2015-03-05 MED ORDER — IPRATROPIUM BROMIDE 0.03 % NA SOLN: 1.0000 | Freq: Every day | NASAL | Status: DC | PRN

## 2015-03-05 MED ORDER — ATORVASTATIN CALCIUM 10 MG PO TABS
10.0000 mg | ORAL_TABLET | Freq: Every day | ORAL | Status: DC
  Administered 2015-03-05 – 2015-03-12 (×8): 10 mg via ORAL
  Filled 2015-03-05 (×8): qty 1

## 2015-03-05 MED ORDER — ACETAMINOPHEN 325 MG PO TABS: 650.0000 mg | ORAL_TABLET | ORAL | Status: DC | PRN

## 2015-03-05 MED ORDER — AMLODIPINE BESYLATE 5 MG PO TABS
5.0000 mg | ORAL_TABLET | Freq: Every day | ORAL | Status: DC
  Administered 2015-03-07 – 2015-03-12 (×6): 5 mg via ORAL
  Filled 2015-03-05 (×6): qty 1

## 2015-03-05 MED ORDER — PANTOPRAZOLE SODIUM 40 MG PO TBEC
40.0000 mg | DELAYED_RELEASE_TABLET | Freq: Every day | ORAL | Status: DC
  Administered 2015-03-06 – 2015-03-13 (×8): 40 mg via ORAL
  Filled 2015-03-05 (×8): qty 1

## 2015-03-05 MED ORDER — AMIODARONE HCL IN DEXTROSE 360-4.14 MG/200ML-% IV SOLN
INTRAVENOUS | Status: AC
  Filled 2015-03-05: qty 200

## 2015-03-05 MED ORDER — AMIODARONE LOAD VIA INFUSION
150.0000 mg | Freq: Once | INTRAVENOUS | Status: AC
  Administered 2015-03-05: 150 mg via INTRAVENOUS
  Filled 2015-03-05: qty 83.34

## 2015-03-05 MED ORDER — ENOXAPARIN SODIUM 40 MG/0.4ML ~~LOC~~ SOLN
40.0000 mg | SUBCUTANEOUS | Status: DC
  Administered 2015-03-05 – 2015-03-07 (×3): 40 mg via SUBCUTANEOUS
  Filled 2015-03-05 (×3): qty 0.4

## 2015-03-05 MED ORDER — ASPIRIN 81 MG PO CHEW: 324.0000 mg | CHEWABLE_TABLET | Freq: Once | ORAL | Status: DC

## 2015-03-05 MED ORDER — PROPRANOLOL HCL 10 MG PO TABS
10.0000 mg | ORAL_TABLET | Freq: Two times a day (BID) | ORAL | Status: DC
  Administered 2015-03-05 – 2015-03-08 (×5): 10 mg via ORAL
  Filled 2015-03-05 (×7): qty 1

## 2015-03-05 NOTE — ED Notes (Signed)
Attempted report 

## 2015-03-05 NOTE — Telephone Encounter (Signed)
Received another call from [employer] regarding event monitor Patient has had at  3 episodes of ventricular tachycardia between 9:20 am and 11:03 am CT (last 2 within 30 minutes of each other).  Patient is asymptomatic  Discussed with Dr Mendel Ryder and recommended patient going to ED Called and spoke with patient and advised him to call 911 to go by EMS to ED, verbalized understanding Will fax strips to ED [phone] Attn: Efraim Kaufmann

## 2015-03-05 NOTE — ED Notes (Signed)
Pt in via [facility] per report pt was contacted today while at home that he had a high HR & was told to come into his PCP office-pt wearing holter monitor, pt went in & his HR was WNL, pt went home & was called by PCP to call 911 d/t an abnormal lab that was not specified, pt A&O x4, denies SOB, n/v/d, & CP

## 2015-03-05 NOTE — Telephone Encounter (Signed)
Second run of VT not available yet on [employer] site.  First run reviewed with Norma Fredrickson, NP and pt needs BMP and Magnesium level today and referral to EP as soon as possible. If he has symptoms he should call 911.  Pt scheduled for appt with Dr. Ladona Ridgel on 10/27 at 8:30.  I spoke with pt who reports he feels fine.  He states he was cleaning his bathroom at time of event.  He will come in for lab work today.  I made pt aware of appt with Dr. Ladona Ridgel.  I instructed pt if he felt dizzy, lightheaded or had feeling of rapid heart rate he should call 911.  Pt also made aware he should not drive until evaluated.

## 2015-03-05 NOTE — ED Provider Notes (Signed)
CSN: [id number]     Arrival date & time 03/05/15  1506 History   First MD Initiated Contact with Patient 03/05/15 1549     Chief Complaint  Patient presents with  . Tachycardia   HPI  Patient presents emergency department for assessment of abnormal rhythms detected on transmittable cardiac monitor. Patient had monitor placed due to concern for underlying arrhythmia and monitor company called notified patient's cardiologist and then contract patient to report to emerge department for evaluation. Patient denies chest pain shortness of breath also states he has no weakness or lightheadedness nor does he have dizziness or vertigo symptoms.   Past medical significant for systolic heart failure with reduced left ventricular EF in 2035-40% ischemic cardiomyopathy remarkable coronary artery disease requiring 2 prior coronary artery bypass graft severe peripheral show disease renal artery stenosis abdominal aortic aneurysm known right bundle branch block suspected atrial versus supraventricular reentrant tachycardia COPD and possible VT.  Past Medical History  Diagnosis Date  . Hyperlipidemia   . Hypertension   . Carotid artery occlusion     s/p L CEA in 2010  . Crohn's disease (HCC)     colon resection  . Bradycardia     previous intolerance to beta blockers - low dose toprol started 10/2011 for tachycardia but had rash and was discontinued  . Chronic systolic heart failure (HCC)     echo 7/12: EF 35-40%, anteroseptal hypokinesis, mild MR.  . Ischemic cardiomyopathy     EF 34% (Lexiscan 06/2011), 35-40% (echo 11/2010)  . Coronary artery disease     CABG 1987, redo 2008; Last LHC 1/10: SVG-OM 1/OM 2 patent, SVG-LAD patent, SVG-OM 3 Patent  . Peripheral vascular disease (HCC)     Carotid dz s/p L CEA, RAS, AAA, LE dz  . AAA (abdominal aortic aneurysm) (HCC)     Ectatic abdominal aorta with fusiform dilitation 3.4x3.4 and moderate thrombus burden 12/2010  . RBBB   . Tachycardia     a. 10/2011 -  atrial tach vs avnrt - BB started.  [name] COPD (chronic obstructive pulmonary disease) (HCC)   . NSVT (nonsustained ventricular tachycardia) (HCC) 03/05/2015  . Myocardial infarction (HCC) 1969  . Gout   . Renal artery stenosis (HCC)     Dopplers 12/2010 - 1-59% R ostial renal artery stenosis, 2 L renal arteries both widely patent   Past Surgical History  Procedure Laterality Date  . Carotid endarterectomy Left 2010  . Colon resection  2008  . Tonsillectomy      as a child  . Pr vein bypass graft,aorto-fem-pop  10/1985  . Femoral endarterectomy Left  06/2011    ileofemoral endarterectomy with bovine patch angioplasty  . Endarterectomy  08/26/2011    Procedure: ENDARTERECTOMY ILIAC;  Surgeon: [name], MD;  Location: [facility] OR;  Service: Vascular;  Laterality: Right;  Right Femoral Artery Endarterectomy with vascu guard patch angioplasty & intraoperative arteriogram.  . Cardiac catheterization  07/28/2012    Native 3v CAD, continued patency of the SVG-OM1-LAD and SVG-OM2-left PDA. LVEF 40% with multiple WMAs  . Abdominal aortagram N/A 06/11/2011    Procedure: ABDOMINAL [name];  Surgeon: [name], MD;  Location: [facility] CATH LAB;  Service: Cardiovascular;  Laterality: N/A;  . Left heart catheterization with coronary angiogram N/A 07/28/2012    Procedure: LEFT HEART CATHETERIZATION WITH CORONARY ANGIOGRAM;  Surgeon: [name], MD;  Location: [facility] CATH LAB;  Service: Cardiovascular;  Laterality: N/A;  . Colon surgery    . Cataract extraction, bilateral    .  Coronary artery bypass graft  10/31/85 & 08/19/06  . Coronary angioplasty     Family History  Problem Relation Age of Onset  . Heart disease Father   . Cancer Mother   . COPD Mother   . Anesthesia problems Neg Hx   . Deep vein thrombosis Brother   . COPD Brother   . CAD Brother    Social History  Substance Use Topics  . Smoking status: Never Smoker   . Smokeless tobacco: Never Used  . Alcohol Use: No    Review of  Systems Complete ROS obtained and pertinent positive and negatives documented above in HPI. All other ROS negative.    Allergies  Novocain and Metoprolol  Home Medications   Prior to Admission medications   Medication Sig Start Date End Date Taking? Authorizing Provider  acetaminophen (TYLENOL) 500 MG tablet Take 1,000 mg by mouth every 6 (six) hours as needed for pain.   Yes Historical Provider, MD  amLODipine (NORVASC) 5 MG tablet Take 1 tablet (5 mg total) by mouth daily. 03/02/14  Yes [name], MD  aspirin 81 MG tablet Take 81 mg by mouth daily.    Yes Historical Provider, MD  atorvastatin (LIPITOR) 10 MG tablet Take 10 mg by mouth at bedtime.    Yes Historical Provider, MD  clopidogrel (PLAVIX) 75 MG tablet TAKE ONE TABLET BY MOUTH ONCE DAILY 01/15/15  Yes [name], MD  COLCRYS 0.6 MG tablet Take 0.6 mg by mouth daily as needed (gout).  04/17/11  Yes Historical Provider, MD  ipratropium (ATROVENT) 0.03 % nasal spray Place 1 spray into the nose daily as needed.    Yes Historical Provider, MD  isosorbide mononitrate (IMDUR) 120 MG 24 hr tablet TAKE ONE TABLET BY MOUTH ONCE DAILY. 03/04/15  Yes [name], MD  losartan (COZAAR) 100 MG tablet TAKE ONE TABLET BY MOUTH ONCE DAILY 03/04/15  Yes [name], MD  Multiple Vitamins-Minerals (MULTIVITAMIN PO) Take 2 tablets by mouth daily.    Yes Historical Provider, MD  OVER THE COUNTER MEDICATION 1 scoop. Barley life otc medication by aim.   Yes Historical Provider, MD  pantoprazole (PROTONIX) 40 MG tablet TAKE ONE TABLET BY MOUTH ONCE DAILY 01/02/15  Yes [name], MD  propranolol (INDERAL) 10 MG tablet Take 1 tablet (10 mg total) by mouth 2 (two) times daily. 12/11/14  Yes Rosalio Macadamia, NP  RANEXA 500 MG 12 hr tablet TAKE ONE TABLET BY MOUTH TWICE DAILY 02/20/15  Yes [name], MD  clopidogrel (PLAVIX) 75 MG tablet Take 75 mg by mouth daily.  01/09/14   Historical Provider, MD  nitroGLYCERIN (NITROSTAT) 0.4 MG SL  tablet Place 0.4 mg under the tongue every 5 (five) minutes as needed for chest pain.    Historical Provider, MD   BP 112/60 mmHg  Pulse 58  Temp(Src) 97.9 F (36.6 C) (Oral)  Resp 18  Ht  (1.676 m)  Wt 135 lb (61.236 kg)  BMI 21.80 kg/m2  SpO2 95% Physical Exam  Constitutional: He is oriented to person, place, and time. He appears well-developed and well-nourished.  Non-toxic appearance. He does not appear ill. No distress.  HENT:  Head: Normocephalic and atraumatic.  Right Ear: External ear normal.  Left Ear: External ear normal.  Eyes: Pupils are equal, round, and reactive to light. No scleral icterus.  Neck: Normal range of motion. Neck supple. No tracheal deviation present.  Cardiovascular: Normal heart sounds and intact distal pulses.   No murmur heard.  Pulmonary/Chest: Effort normal and breath sounds normal. No stridor. No respiratory distress. He has no wheezes. He has no rales.  Portable cardiac monitor in place on left hip.  Abdominal: Soft. Bowel sounds are normal. He exhibits no distension. There is no tenderness. There is no rebound and no guarding.  Musculoskeletal: Normal range of motion.  Neurological: He is alert and oriented to person, place, and time. He has normal strength and normal reflexes. No cranial nerve deficit or sensory deficit.  Skin: Skin is warm and dry. No pallor.  Psychiatric: He has a normal mood and affect. His behavior is normal.  Nursing note and vitals reviewed.   ED Course  Procedures (including critical care time) Labs Review Labs Reviewed  CBC - Abnormal; Notable for the following:    RBC 3.73 (*)    Hemoglobin 12.4 (*)    HCT 37.1 (*)    Platelets 145 (*)    All other components within normal limits  MAGNESIUM - Abnormal; Notable for the following:    Magnesium 1.6 (*)    All other components within normal limits  BASIC METABOLIC PANEL - Abnormal; Notable for the following:    Glucose, Bld 106 (*)    BUN 22 (*)     Creatinine, Ser 1.41 (*)    Calcium 8.7 (*)    GFR calc non Af Amer 44 (*)    GFR calc Af Amer 51 (*)    All other components within normal limits  PROTIME-INR - Abnormal; Notable for the following:    Prothrombin Time 15.3 (*)    All other components within normal limits  LIPID PANEL - Abnormal; Notable for the following:    HDL 30 (*)    All other components within normal limits  I-STAT CHEM 8, ED - Abnormal; Notable for the following:    BUN 23 (*)    Creatinine, Ser 1.40 (*)    Glucose, Bld 101 (*)    All other components within normal limits  PROTIME-INR  APTT  PHOSPHORUS  I-STAT CHEM 8, ED  I-STAT TROPOININ, ED  I-STAT TROPOININ, ED  Rosezena Sensor, ED    Imaging Review Dg Chest Port 1 View  03/05/2015  CLINICAL DATA:  tachcardia EXAM: PORTABLE CHEST - 1 VIEW COMPARISON:  02/12/2014 FINDINGS: Previous CABG. Atheromatous aorta. Heart size normal. Lungs clear. Surgical clips at the thoracic inlet on the left. Multiple monitoring leads overlie the patient. No definite pneumothorax. No effusion. IMPRESSION: 1. No acute disease post CABG. Electronically Signed   By: Corlis Leak M.D.   On: 03/05/2015 16:06   I have personally reviewed and evaluated these images and lab results as part of my medical decision-making.   EKG Interpretation   Date/Time:  Tuesday March 05 2015 15:13:50 EDT Ventricular Rate:  144 PR Interval:  70 QRS Duration: 134 QT Interval:  366 QTC Calculation: 567 R Axis:   92 Text Interpretation:  Wide-QRS tachycardia Nonspecific intraventricular  conduction delay Baseline wander in lead(s) II III aVR aVF Confirmed by  Denton Lank  MD, Caryn Bee ([id number]) on 03/05/2015 3:54:48 PM      MDM   Karl Stevenson is a 81 y.o. male with H&P as above. ED clinical course as follows:  Due to [facility] defibrillation pads immediately placed on the patient and he was attached to telemetry. I urgently obtained ccnsultation from Cardiology.  Per my review of patients PMHx it  is remarkable for "CAD (s/p CABG in 1987, re-do in 2008, last cath 2014 with  patent SVG-OM1/OM2, SVG-LAD, and SVG-OM3), ischemic DCM (EF 35-40% by echo in 2012), PVD (s/p left CEA 2010), AAA, renal artery stenosis, moderate left subclavian stenosis, chronic systolic CHF, HTN, HLD, COPD, and intermittent bradycardia and tachycardia with bifasicular block" per my review of Cardiology's consultant note.   Arrived HR in the 140s with unclear Wide Complex tachycardia, no hypotension or AMS or severe symptoms therefore required no cardioversion or defibrillation. ECG showed WCT with LBBB. CXR revealed no acute findings.   Due to concerns for sustained Vtach patient admitted to Cardiology. Per the admitting team they will start Amio on the floor. Patient spontaneously normal rate in ED during their evaluation and required no interventions while in the ED.   Clinical Impression:  1. Wide-complex tachycardia (HCC)    Disposition:  Admit for continued Cardiac Evaluation.  Laboratory and Imaging results were personally reviewed by myself and used in the medical decision making of this patient's treatment and disposition. I also reviewed Radiology's interpretation of Imaging.   Patient care discussed with Dr. Denton Lank, who oversaw their evaluation & treatment & voiced agreement. House Officer: Jonette Eva, MD, Emergency Medicine.   Jonette Eva, MD 03/07/15 1610  Cathren Laine, MD 03/11/15 [phone]

## 2015-03-05 NOTE — ED Notes (Signed)
zoll pads applied to patient  

## 2015-03-05 NOTE — H&P (Signed)
Patient ID: Karl Stevenson MRN: [id number], DOB/AGE: [birth year]   Admit date: 03/05/2015   Primary Physician: Garlan Fillers, MD Primary Cardiologist: Dr. Excell Seltzer   Karl Stevenson is a 81 y.o. male with past medical history of CAD (s/p CABG in 1987, re-do in 2008, last cath 2014 with patent SVG-OM1/OM2, SVG-LAD, and SVG-OM3), ischemic DCM (EF 35-40% by echo in 2012), PVD (s/p left CEA 2010), AAA, renal artery stenosis, moderate left subclavian stenosis, chronic systolic CHF, HTN, HLD, COPD, and intermittent bradycardia and tachycardia with bifasicular block who presents to the [facility] ED on 03/05/2015 for tachycardia.   He was last seen in the office on 12/11/2014 and doing well at that time. However, since then he had notified the office of elevated HR to the 150's which was accompanied by feeling tired and weak. HR was in the 140's on October 8th and in the 50's on 02/21/2015. Event monitor was recommended and he received the monitor on 02/26/2015.   The office was notified today by [employer] he had a 36 second run of VT at 0921 this morning. He was informed of this and was asymptomatic at the time. He had an additional 3 episodes of VT between 0921 and 1103 but remained asymptomatic. Dr. Katrinka Blazing was informed of this and recommended he come to the ED to be evaluated.  He reports being asymptomatic at this time. Was cleaning his bathroom when he was notified of his elevated HR.  Denies any fatigue, lightheadedness, dizziness, headaches, chest pain, palpitations, shortness of breath, orthopnea, or PND. Reports occasional lower extremity edema.  Had recent Myoview in 02/2014 which showed scar with mild ischemia but medical management was chosen for further management of his symptoms.  Problem List Past Medical History  Diagnosis Date  . Hyperlipidemia   . Hypertension   . Carotid artery occlusion     s/p L CEA in 2010  . Crohn's disease (HCC)     colon resection  .  Bradycardia     previous intolerance to beta blockers - low dose toprol started 10/2011 for tachycardia but had rash and was discontinued  . Chronic systolic heart failure (HCC)     echo 7/12: EF 35-40%, anteroseptal hypokinesis, mild MR.  . Ischemic cardiomyopathy     EF 34% (Lexiscan 06/2011), 35-40% (echo 11/2010)  . Coronary artery disease     CABG 1987, redo 2008; Last LHC 1/10: SVG-OM 1/OM 2 patent, SVG-LAD patent, SVG-OM 3 Patent  . Peripheral vascular disease (HCC)     Carotid dz s/p L CEA, RAS, AAA, LE dz  . Renal artery stenosis (HCC)     Dopplers 12/2010 - 1-59% R ostial renal artery stenosis, 2 L renal arteries both widely patent  . Gouty arthritis   . AAA (abdominal aortic aneurysm) (HCC)     Ectatic abdominal aorta with fusiform dilitation 3.4x3.4 and moderate thrombus burden 12/2010  . RBBB   . Tachycardia     a. 10/2011 - atrial tach vs avnrt - BB started.  . Myocardial infarction (HCC)   . [facility]     Past Surgical History  Procedure Laterality Date  . Carotid endarterectomy  2010    left  . Colon resection    . Tonsillectomy      as a child  . Pr vein bypass graft,aorto-fem-pop  10/1985  . Vein bypass surgery      LEFT   06/2011  . Endarterectomy  08/26/2011  Procedure: ENDARTERECTOMY ILIAC;  Surgeon: [name], MD;  Location: [facility] OR;  Service: Vascular;  Laterality: Right;  Right Femoral Artery Endarterectomy with vascu guard patch angioplasty & intraoperative arteriogram.  . Coronary artery bypass graft  10/31/85 & 08/19/06  . Cardiac catheterization  07/28/2012    Native 3v CAD, continued patency of the SVG-OM1-LAD and SVG-OM2-left PDA. LVEF 40% with multiple WMAs  . Abdominal aortagram N/A 06/11/2011    Procedure: ABDOMINAL [name];  Surgeon: [name], MD;  Location: [facility] CATH LAB;  Service: Cardiovascular;  Laterality: N/A;  . Left heart catheterization with coronary angiogram N/A 07/28/2012    Procedure: LEFT HEART  CATHETERIZATION WITH CORONARY ANGIOGRAM;  Surgeon: [name], MD;  Location: [facility] CATH LAB;  Service: Cardiovascular;  Laterality: N/A;      Home Medications Prior to Admission medications   Medication Sig Start Date End Date Taking? Authorizing Provider  acetaminophen (TYLENOL) 500 MG tablet Take 1,000 mg by mouth every 6 (six) hours as needed for pain.   Yes Historical Provider, MD  amLODipine (NORVASC) 5 MG tablet Take 1 tablet (5 mg total) by mouth daily. 03/02/14  Yes [name], MD  aspirin 81 MG tablet Take 81 mg by mouth daily.    Yes Historical Provider, MD  atorvastatin (LIPITOR) 10 MG tablet Take 10 mg by mouth at bedtime.    Yes Historical Provider, MD  clopidogrel (PLAVIX) 75 MG tablet TAKE ONE TABLET BY MOUTH ONCE DAILY 01/15/15  Yes [name], MD  COLCRYS 0.6 MG tablet Take 0.6 mg by mouth daily as needed (gout).  04/17/11  Yes Historical Provider, MD  ipratropium (ATROVENT) 0.03 % nasal spray Place 1 spray into the nose daily as needed.    Yes Historical Provider, MD  isosorbide mononitrate (IMDUR) 120 MG 24 hr tablet TAKE ONE TABLET BY MOUTH ONCE DAILY. 03/04/15  Yes [name], MD  losartan (COZAAR) 100 MG tablet TAKE ONE TABLET BY MOUTH ONCE DAILY 03/04/15  Yes [name], MD  Multiple Vitamins-Minerals (MULTIVITAMIN PO) Take 2 tablets by mouth daily.    Yes Historical Provider, MD  OVER THE COUNTER MEDICATION 1 scoop. Barley life otc medication by aim.   Yes Historical Provider, MD  pantoprazole (PROTONIX) 40 MG tablet TAKE ONE TABLET BY MOUTH ONCE DAILY 01/02/15  Yes [name], MD  propranolol (INDERAL) 10 MG tablet Take 1 tablet (10 mg total) by mouth 2 (two) times daily. 12/11/14  Yes Rosalio Macadamia, NP  RANEXA 500 MG 12 hr tablet TAKE ONE TABLET BY MOUTH TWICE DAILY 02/20/15  Yes [name], MD  clopidogrel (PLAVIX) 75 MG tablet Take 75 mg by mouth daily.  01/09/14   Historical Provider, MD  nitroGLYCERIN (NITROSTAT) 0.4 MG SL tablet Place 0.4 mg  under the tongue every 5 (five) minutes as needed for chest pain.    Historical Provider, MD    Allergies Allergies  Allergen Reactions  . Novocain [Procaine Hcl] Other (See Comments)    Cold sweats and very bad headache.   . Metoprolol Hives, Itching and Rash    Past Medical History Past Medical History  Diagnosis Date  . Hyperlipidemia   . Hypertension   . Carotid artery occlusion     s/p L CEA in 2010  . Crohn's disease (HCC)     colon resection  . Bradycardia     previous intolerance to beta blockers - low dose toprol started 10/2011 for tachycardia but had rash and was discontinued  . Chronic systolic  heart failure (HCC)     echo 7/12: EF 35-40%, anteroseptal hypokinesis, mild MR.  . Ischemic cardiomyopathy     EF 34% (Lexiscan 06/2011), 35-40% (echo 11/2010)  . Coronary artery disease     CABG 1987, redo 2008; Last LHC 1/10: SVG-OM 1/OM 2 patent, SVG-LAD patent, SVG-OM 3 Patent  . Peripheral vascular disease (HCC)     Carotid dz s/p L CEA, RAS, AAA, LE dz  . Renal artery stenosis (HCC)     Dopplers 12/2010 - 1-59% R ostial renal artery stenosis, 2 L renal arteries both widely patent  . Gouty arthritis   . AAA (abdominal aortic aneurysm) (HCC)     Ectatic abdominal aorta with fusiform dilitation 3.4x3.4 and moderate thrombus burden 12/2010  . RBBB   . Tachycardia     a. 10/2011 - atrial tach vs avnrt - BB started.  . Myocardial infarction (HCC)   . COPD (chronic obstructive pulmonary disease) [facility])      Surgical History Past Surgical History  Procedure Laterality Date  . Carotid endarterectomy  2010    left  . Colon resection    . Tonsillectomy      as a child  . Pr vein bypass graft,aorto-fem-pop  10/1985  . Vein bypass surgery      LEFT   06/2011  . Endarterectomy  08/26/2011    Procedure: ENDARTERECTOMY ILIAC;  Surgeon: [name], MD;  Location: [facility] OR;  Service: Vascular;  Laterality: Right;  Right Femoral Artery Endarterectomy with vascu guard patch  angioplasty & intraoperative arteriogram.  . Coronary artery bypass graft  10/31/85 & 08/19/06  . Cardiac catheterization  07/28/2012    Native 3v CAD, continued patency of the SVG-OM1-LAD and SVG-OM2-left PDA. LVEF 40% with multiple WMAs  . Abdominal aortagram N/A 06/11/2011    Procedure: ABDOMINAL [name];  Surgeon: [name], MD;  Location: [facility] CATH LAB;  Service: Cardiovascular;  Laterality: N/A;  . Left heart catheterization with coronary angiogram N/A 07/28/2012    Procedure: LEFT HEART CATHETERIZATION WITH CORONARY ANGIOGRAM;  Surgeon: [name], MD;  Location: [facility] CATH LAB;  Service: Cardiovascular;  Laterality: N/A;     Family History Family History  Problem Relation Age of Onset  . Heart disease Father   . Cancer Mother   . COPD Mother   . Anesthesia problems Neg Hx   . Deep vein thrombosis Brother   . COPD Brother   . CAD Brother     Social History Social History   Social History  . Marital Status: Married    Spouse Name: N/A  . Number of Children: N/A  . Years of Education: N/A   Occupational History  . Not on file.   Social History Main Topics  . Smoking status: Never Smoker   . Smokeless tobacco: Never Used  . Alcohol Use: No  . Drug Use: No  . Sexual Activity: Yes   Other Topics Concern  . Not on file   Social History Narrative     Review of Systems General:  No chills, fever, night sweats or weight changes.  Cardiovascular:  No chest pain, dyspnea on exertion, edema, orthopnea, palpitations, paroxysmal nocturnal dyspnea. Dermatological: No rash, lesions/masses Respiratory: No cough, dyspnea Urologic: No hematuria, dysuria Abdominal:   No nausea, vomiting, diarrhea, bright red blood per rectum, melena, or hematemesis Neurologic:  No visual changes, wkns, changes in mental status. All other systems reviewed and are otherwise negative except as noted above.   Physical Exam: Blood pressure  116/87, pulse 143, temperature 97.8 F (36.6 C),  temperature source Oral, resp. rate 19, height  (1.676 m), weight 135 lb (61.236 kg), SpO2 100 %.  General: Well developed, well nourished Elderly, Caucasian,male in no acute distress. Head: Normocephalic, atraumatic, sclera non-icteric, no xanthomas, nares are without discharge. Dentition:  Neck: No carotid bruits. JVD not elevated.  Lungs: Respirations regular and unlabored, without wheezes or rales.  Heart: Regular rhythm, tachycardiac rate. No S3 or S4.  No murmur, no rubs, or gallops appreciated. Abdomen: Soft, non-tender, non-distended with normoactive bowel sounds. No hepatomegaly. No rebound/guarding. No obvious abdominal masses. Msk:  Strength and tone appear normal for age. No joint deformities or effusions. Extremities: No clubbing or cyanosis. No edema.  Distal pedal pulses are 2+ bilaterally. Neuro: Alert and oriented X 3. Moves all extremities spontaneously. No focal deficits noted. Psych:  Responds to questions appropriately with a normal affect. Skin: No rashes or lesions noted   Labs Lab Results  Component Value Date   WBC 5.9 03/05/2015   HGB 13.3 03/05/2015   HCT 39.0 03/05/2015   MCV 99.5 03/05/2015   PLT 145* 03/05/2015    Recent Labs Lab 03/05/15 1247 03/05/15 1526  NA 139 142  K 3.9 4.1  CL 105 103  CO2 26  --   BUN 22 23*  CREATININE 1.35* 1.40*  CALCIUM 9.3  --   GLUCOSE 111* 101*   Troponin (Point of Care Test)  Recent Labs  03/05/15 1524  TROPIPOC 0.01    Recent Labs  03/05/15 1520  INR 1.17   Lab Results  Component Value Date   CHOL 102 01/09/2014   HDL 33.30* 01/09/2014   LDLCALC 48 01/09/2014   TRIG 106.0 01/09/2014   No results found for: BNP PRO B NATRIURETIC PEPTIDE (BNP)  Date/Time Value Ref Range Status  02/13/2014 12:41 AM 439.0 0 - 450 pg/mL Final   Lab Results  Component Value Date   DDIMER 11.79* 11/24/2010     ECG: Wide complex tachycardia with rate in 140's. LBBB noted.   Echo:  Pending  Radiology/Studies: Dg Chest Port 1 View: 03/05/2015  CLINICAL DATA:  tachcardia EXAM: PORTABLE CHEST - 1 VIEW COMPARISON:  02/12/2014 FINDINGS: Previous CABG. Atheromatous aorta. Heart size normal. Lungs clear. Surgical clips at the thoracic inlet on the left. Multiple monitoring leads overlie the patient. No definite pneumothorax. No effusion. IMPRESSION: 1. No acute disease post CABG. Electronically Signed   By: Corlis Leak M.D.   On: 03/05/2015 16:06    ASSESSMENT AND PLAN:   1. Recurrent Episodes of NSVT - event monitor showed a 36 second run of asymptomatic VT at 0921 on 03/05/2015.  He had an additional 3 episodes of VT between 0921 and 1103. - Chalmers Iddings obtain echocardiogram. - anticipate potential catheterization pending echo results. - Danice Dippolito start IV Amiodarone. - Faelyn Sigler discontinue Propranolol and start Metoprolol. Titrate up as BP and HR allow.  2. History of CAD - s/p CABG in 1987, re-do in 2008 - last cardiac catheterization was in 2014 showing patent SVG-OM1/OM2, SVG-LAD, and SVG-OM3. - recent Myoview in 2015 showing scar and mild ischemia with medical management at that time. - consider need to repeat cardiac catheterization pending echo results. - continue ASA, Plavix, Imdur, Ranexa, and BB.  3. Ischemic DCM (EF 35-40% by echo in 2012) - does not appear volume overloaded on examination - Merlon Alcorta obtain new echocardiogram.  4. HTN - BP 106/58 - 141/122 while in the ED. - Donovan Persley continue CCB  and BB.  - hold ARB due to elevated creatinine and possible upcoming cath.  5. HLD - continue statin therapy.  6. COPD - Kalief Kattner continue home medications.  7. Acute on Chronic CKD Stage 3 - creatinine elevated to 1.40. Baseline around 1.2 - 1.3. - hold ARB.   Signed, Ellsworth Lennox, PA-C 03/05/2015, 4:09 PM Pager: [phone]  I have seen and examined this patient with [place].  Agree with above, note added to reflect my findings.  On exam, regular rhythm, no  murmurs, lungs clear.  Had 30 day monitor on and had evidence of multiple episodes of wide complex tachycardia with AV dissociation.  With his history of CAD, this is VT.  Atthew Coutant start amiodarone and get a repeat echo.  Consider cath to assess for ischemia.  He also has HF and takes propranolol.  AmeLie Hollars switch to metoprolol with d/c on toprol XL or coreg pending results of echo.  Chakita Mcgraw M. Dorleen Kissel MD 03/05/2015 5:22 PM

## 2015-03-05 NOTE — Telephone Encounter (Signed)
Received call from [employer]. They are calling to report auto detected event at 9:21 AM CT.  Pt was in SR with 1st degree AV block.  He had 36 second run of VT times 2.  Pt was called and was asymptomatic.  Will get strips and review with provider.

## 2015-03-05 NOTE — Progress Notes (Signed)
Paged by nursing staff as pt arrived on 3W with wide complex tachycardia with HR 150s. During examination, patient is alert and oriented without any symptom (surprisingly). SBP 120s. Unclear if true VT vs SVT with abberancy. Did not see amio IV has been started. Asked nurse to start amio bolus followed by amio gtt.   Patient seen together with overnight cardiology fellow. He spontaneously converted on amio. EKG obtained by fellow during tachycardia, felt to be slow VT. Current in NSR with HR 80s  Signed, Azalee Course PA Pager: [phone]

## 2015-03-05 NOTE — ED Notes (Addendum)
Patient undressed, in gown, on monitor, continuous pulse oximetry and blood pressure cuff; Resident, MD at bedside

## 2015-03-06 ENCOUNTER — Inpatient Hospital Stay (HOSPITAL_COMMUNITY): Payer: Medicare PPO

## 2015-03-06 DIAGNOSIS — I472 Ventricular tachycardia: Secondary | ICD-10-CM

## 2015-03-06 LAB — BASIC METABOLIC PANEL
Anion gap: 8 (ref 5–15)
BUN: 22 mg/dL — AB (ref 6–20)
CALCIUM: 8.7 mg/dL — AB (ref 8.9–10.3)
CHLORIDE: 101 mmol/L (ref 101–111)
CO2: 27 mmol/L (ref 22–32)
CREATININE: 1.41 mg/dL — AB (ref 0.61–1.24)
GFR calc non Af Amer: 44 mL/min — ABNORMAL LOW (ref >60)
GFR, EST AFRICAN AMERICAN: 51 mL/min — AB (ref >60)
GLUCOSE: 106 mg/dL — AB (ref 65–99)
Potassium: 3.8 mmol/L (ref 3.5–5.1)
Sodium: 136 mmol/L (ref 135–145)

## 2015-03-06 LAB — LIPID PANEL
CHOLESTEROL: 99 mg/dL (ref 0–200)
HDL: 30 mg/dL — AB (ref >40)
LDL Cholesterol: 41 mg/dL (ref 0–99)
TRIGLYCERIDES: 138 mg/dL (ref <150)
Total CHOL/HDL Ratio: 3.3 RATIO
VLDL: 28 mg/dL (ref 0–40)

## 2015-03-06 LAB — PROTIME-INR
INR: 1.2 (ref 0.00–1.49)
PROTHROMBIN TIME: 15.3 s — AB (ref 11.6–15.2)

## 2015-03-06 MED ORDER — AMIODARONE HCL 200 MG PO TABS
200.0000 mg | ORAL_TABLET | Freq: Every day | ORAL | Status: DC
  Administered 2015-03-06 – 2015-03-11 (×6): 200 mg via ORAL
  Filled 2015-03-06 (×6): qty 1

## 2015-03-06 NOTE — Progress Notes (Signed)
The client had an episode of wide complex tachycardia starting 1954 and was asymptomtic with the blood pressure 120's over 90's. The PA on call was paged along with the cards fellow. 2L of oxygen was applied and an ECG was obtained. An amiodarone loading dose of 150 mg was given then continued on a gtt. The client spontaneously converted 2036. Will continue to monitor the client closely

## 2015-03-06 NOTE — Progress Notes (Signed)
UR Completed Nathan Stallworth Graves-Bigelow, RN,BSN [phone]  

## 2015-03-06 NOTE — Progress Notes (Signed)
  Echocardiogram 2D Echocardiogram has been performed.  Karl Stevenson 03/06/2015, 1:13 PM

## 2015-03-06 NOTE — Progress Notes (Signed)
SUBJECTIVE: The patient is doing well today.  At this time, he denies chest pain, shortness of breath, or any new concerns.  Asymptomatic with sustained VT last night and hemodynamically stable.  IV amiodarone discontinued 2/2 bradycardia  Last cath 2014 demonstrated EF 40%, patency of SVG to first OM, SVT to LAD, SVG to OM2 and PDA (no change from 2010)  CURRENT MEDICATIONS: . amLODipine  5 mg Oral Daily  . aspirin EC  81 mg Oral Daily  . atorvastatin  10 mg Oral QHS  . clopidogrel  75 mg Oral Daily  . enoxaparin (LOVENOX) injection  40 mg Subcutaneous Q24H  . isosorbide mononitrate  120 mg Oral Daily  . pantoprazole  40 mg Oral Daily  . propranolol  10 mg Oral BID  . ranolazine  500 mg Oral BID      OBJECTIVE: Physical Exam: Filed Vitals:   03/06/15 0258 03/06/15 0300 03/06/15 0303 03/06/15 0514  BP:  110/55    Pulse: 46 47 49   Temp:    97.7 F (36.5 C)  TempSrc:    Oral  Resp: Height:      Weight:      SpO2: 99% 99% 99%     Intake/Output Summary (Last 24 hours) at 03/06/15 0945 Last data filed at 03/06/15 0900  Gross per 24 hour  Intake 232.65 ml  Output    770 ml  Net -537.35 ml    Telemetry reveals sinus rhythm/sinus bradycardia, sustained VT last night terminated with IV amiodarone  GEN- The patient is elderly appearing, alert and oriented x 3 today.   Head- normocephalic, atraumatic Eyes-  Sclera clear, conjunctiva pink Ears- hearing intact Oropharynx- clear Neck- supple  Lungs- Clear to ausculation bilaterally, normal work of breathing Heart- Regular rate and rhythm, no murmurs, rubs or gallops  GI- soft, NT, ND, + BS Extremities- no clubbing, cyanosis, or edema Skin- no rash or lesion Psych- euthymic mood, full affect Neuro- strength and sensation are intact  LABS: Basic Metabolic Panel:  Recent Labs  16/10/96 1247 03/05/15 1520 03/05/15 1526 03/06/15 0341  NA 139  --  142 136  K 3.9  --  4.1 3.8  CL 105  --  103 101  CO2  26  --   --  27  GLUCOSE 111*  --  101* 106*  BUN 22  --  23* 22*  CREATININE 1.35*  --  1.40* 1.41*  CALCIUM 9.3  --   --  8.7*  MG 1.5 1.6*  --   --   PHOS  --  3.3  --   --    CBC:  Recent Labs  03/05/15 1520 03/05/15 1526  WBC 5.9  --   HGB 12.4* 13.3  HCT 37.1* 39.0  MCV 99.5  --   PLT 145*  --    Fasting Lipid Panel:  Recent Labs  03/06/15 0341  CHOL 99  HDL 30*  LDLCALC 41  TRIG 045  CHOLHDL 3.3    RADIOLOGY: Dg Chest Port 1 View 03/05/2015  CLINICAL DATA:  tachcardia EXAM: PORTABLE CHEST - 1 VIEW COMPARISON:  02/12/2014 FINDINGS: Previous CABG. Atheromatous aorta. Heart size normal. Lungs clear. Surgical clips at the thoracic inlet on the left. Multiple monitoring leads overlie the patient. No definite pneumothorax. No effusion. IMPRESSION: 1. No acute disease post CABG. Electronically Signed   By: Corlis Leak M.D.   On: 03/05/2015 16:06    ASSESSMENT AND PLAN:  Active Problems:   CAD (coronary artery disease)   HTN (hypertension)   CKD (chronic kidney disease), stage III   NSVT (nonsustained ventricular tachycardia) (HCC)    1.  Sustained, hemodynamically stable VT The patient has had tachypalpitations not associated with dizziness, pre-syncope, or syncope for several years.   EKG's this admission have demonstrated 2 different VT morphologies He has not tolerated scheduled BB in the past 2/2 bradycardia.  With significant underlying conduction system disease, I do not think he will tolerate high doses of AAD therapy.  Will try amiodarone 200mg  daily with a low threshold to decrease to 100mg  daily for control of VT With advanced age, he is not a candidate for ICD  2.  CAD/ICM No recent ischemic symptoms Update echo His coronary anatomy was stable from 2010 to 2014.  I doubt that with his longstanding history of palpitations that his anatomy has changed.  If echo relatively stable, would not pursue ischemic eval at this time. (Discussed with Dr Excell Seltzer who  knows the patient well and agrees) Continue medical therapy - BB unable to be tolerated 2/2 bradycardia    3.  Conduction system disease/sinus bradycardia The patient has underlying conduction system disease with 1st degree AV block and RBBB He has not had any symptoms of dizziness, pre-syncope, or syncope. He has had increased fatigue that he relates to palpitations I think at some point he may require back-up brady pacing.  With RBBB at baseline, there is no data to suggest that he would benefit from LV lead.  If he eventually requires pacing support, would recommend dual chamber PPM implant and not ICD with advanced age (could add BB and up-titrate amio at that time)  4.  CKD ARB currently on hold  Dr Elberta Fortis to see later today  Gypsy Balsam, NP 03/06/2015 10:09 AM   I have seen and examined this patient with Gypsy Balsam.  Agree with above, note added to reflect my findings.  On exam, regular rhythm, no murmurs, lungs clear.  Plan for echo today as he has not had an updated EF evalution in a few years.  This will give [place] a better idea on treamtnet.   He has had 2 different VT morphologies, likely from the same scar.  He became bradycardic with IV amiodarone and has had issues with bradycardia in the past.  Will therefore plan for PO amiodaorne 200 mg.  He may also require pacing for his bradycardia to be able to have him on all the HF medications needed.  Will see how he tolerates amiodarone today.  No indication for cath based on stable disease from last two procedures.    Will M. Camnitz MD 03/06/2015 11:10 AM

## 2015-03-06 NOTE — Progress Notes (Signed)
The clients HR is in the 40's and the blood pressure is 98/51. Spoke with Virgina Organ MD fellow on call. Decreased the amiodarone from 60mg /hr to 30mg /hr per verbal order. If heart rate remains below 50 after one hour given orders to discontinue gtt.

## 2015-03-07 ENCOUNTER — Telehealth: Payer: Self-pay | Admitting: *Deleted

## 2015-03-07 ENCOUNTER — Institutional Professional Consult (permissible substitution): Payer: Medicare PPO | Admitting: Internal Medicine

## 2015-03-07 ENCOUNTER — Encounter (HOSPITAL_COMMUNITY): Payer: Self-pay | Admitting: Nurse Practitioner

## 2015-03-07 DIAGNOSIS — I472 Ventricular tachycardia, unspecified: Secondary | ICD-10-CM

## 2015-03-07 LAB — GLUCOSE, CAPILLARY: Glucose-Capillary: 99 mg/dL (ref 65–99)

## 2015-03-07 MED ORDER — AMIODARONE HCL IN DEXTROSE 360-4.14 MG/200ML-% IV SOLN
INTRAVENOUS | Status: AC
  Filled 2015-03-07: qty 200

## 2015-03-07 MED ORDER — SODIUM CHLORIDE 0.9 % IR SOLN
80.0000 mg | Status: DC
  Filled 2015-03-07: qty 2

## 2015-03-07 MED ORDER — AMIODARONE IV BOLUS ONLY 150 MG/100ML
150.0000 mg | Freq: Once | INTRAVENOUS | Status: AC
Start: 2015-03-07 — End: 2015-03-07
  Administered 2015-03-07: 150 mg via INTRAVENOUS

## 2015-03-07 MED ORDER — SODIUM CHLORIDE 0.9 % IV SOLN
INTRAVENOUS | Status: DC
  Administered 2015-03-08: 06:00:00 via INTRAVENOUS

## 2015-03-07 MED ORDER — AMIODARONE IV BOLUS ONLY 150 MG/100ML
150.0000 mg | Freq: Once | INTRAVENOUS | Status: AC
  Administered 2015-03-07: 150 mg via INTRAVENOUS

## 2015-03-07 MED ORDER — CEFAZOLIN SODIUM-DEXTROSE 2-3 GM-% IV SOLR
2.0000 g | INTRAVENOUS | Status: DC
  Filled 2015-03-07: qty 50

## 2015-03-07 NOTE — Progress Notes (Signed)
Patient Name: Karl Stevenson Date of Encounter: 03/07/2015    Principal Problem:   Ventricular tachycardia (HCC) Active Problems:   CAD (coronary artery disease)   HTN (hypertension)   Ischemic cardiomyopathy   Chronic systolic heart failure (HCC)   CKD (chronic kidney disease), stage III   Crohn's disease (HCC)   PVD (peripheral vascular disease) (HCC)   Carotid stenosis   RBBB   NSVT (nonsustained ventricular tachycardia) (HCC)    SUBJECTIVE  Feeling well this AM.  Ambulated yesterday without difficulty.  CURRENT MEDS . amiodarone  200 mg Oral Daily  . amLODipine  5 mg Oral Daily  . aspirin EC  81 mg Oral Daily  . atorvastatin  10 mg Oral QHS  . clopidogrel  75 mg Oral Daily  . enoxaparin (LOVENOX) injection  40 mg Subcutaneous Q24H  . isosorbide mononitrate  120 mg Oral Daily  . pantoprazole  40 mg Oral Daily  . propranolol  10 mg Oral BID  . ranolazine  500 mg Oral BID    OBJECTIVE  Filed Vitals:   03/06/15 1718 03/06/15 2000 03/07/15 0000 03/07/15 0400  BP: 115/62 105/57 112/60 116/59  Pulse: 65 62 58 94  Temp: 97.7 F (36.5 C) 97.9 F (36.6 C) 97.9 F (36.6 C) 98.1 F (36.7 C)  TempSrc: Oral Oral Oral Oral  Resp: Height:      Weight:    131 lb 14.4 oz (59.829 kg)  SpO2: 98% 97% 95% 96%    Intake/Output Summary (Last 24 hours) at 03/07/15 0821 Last data filed at 03/07/15 0801  Gross per 24 hour  Intake    480 ml  Output    320 ml  Net    160 ml   Filed Weights   03/05/15 1539 03/07/15 0400  Weight: 135 lb (61.236 kg) 131 lb 14.4 oz (59.829 kg)    PHYSICAL EXAM  General: Pleasant, NAD. Neuro: Alert and oriented X 3. Moves all extremities spontaneously. Psych: Normal affect. HEENT:  Normal  Neck: Supple without  JVD. R carotid bruit. Lungs:  Resp regular and unlabored, diminished breath sounds left base. Heart: RRR, distant, no s3, s4, or murmurs. Abdomen: Soft, non-tender, non-distended, BS + x 4.  Extremities: No  clubbing, cyanosis or edema. DP/PT/Radials 2+ and equal bilaterally.  Accessory Clinical Findings  CBC  Recent Labs  03/05/15 1520 03/05/15 1526  WBC 5.9  --   HGB 12.4* 13.3  HCT 37.1* 39.0  MCV 99.5  --   PLT 145*  --    Basic Metabolic Panel  Recent Labs  03/05/15 1247 03/05/15 1520 03/05/15 1526 03/06/15 0341  NA 139  --  142 136  K 3.9  --  4.1 3.8  CL 105  --  103 101  CO2 26  --   --  27  GLUCOSE 111*  --  101* 106*  BUN 22  --  23* 22*  CREATININE 1.35*  --  1.40* 1.41*  CALCIUM 9.3  --   --  8.7*  MG 1.5 1.6*  --   --   PHOS  --  3.3  --   --    Fasting Lipid Panel  Recent Labs  03/06/15 0341  CHOL 99  HDL 30*  LDLCALC 41  TRIG 782  CHOLHDL 3.3   TELE  SR, 1st degree AVB, PVC's.  QTc = 487.  ECG  Sinus brady, 53, LAD, LAFB, RBBB, old inf/antsept infarcts.  2D Echocardiogram 10.26.2016  Study Conclusions  - Left ventricle: The cavity size was normal. Systolic function was severely reduced. The estimated ejection fraction was in the range of 20% to 25%. Diffuse hypokinesis. There is akinesis of the mid-apicalanteroseptal and apical myocardium. Doppler parameters are consistent with abnormal left ventricular relaxation (grade 1 diastolic dysfunction). - Mitral valve: Calcified annulus. There was mild regurgitation.  Impressions:  - When compared to prior echocardiogram, EF is reduced (prior 35%). _____________   Radiology/Studies  Dg Chest Port 1 View  03/05/2015  CLINICAL DATA:  tachcardia EXAM: PORTABLE CHEST - 1 VIEW COMPARISON:  02/12/2014 FINDINGS: Previous CABG. Atheromatous aorta. Heart size normal. Lungs clear. Surgical clips at the thoracic inlet on the left. Multiple monitoring leads overlie the patient. No definite pneumothorax. No effusion. IMPRESSION: 1. No acute disease post CABG. Electronically Signed   By: Corlis Leak M.D.   On: 03/05/2015 16:06    ASSESSMENT AND PLAN  1.  Ventricular Tachycardia:  In  setting of ICM with EF of 20-25% by echo yesterday.  EF prev 45% by Spect in 02/2014.  He has not been having c/p.  He was not symptomatic on the day of admission but previously reported tachycardia associated with fatigue @ home.  IV amio added on 10/25 but was d/c'd 2/2 bradycardia in setting of bi-fasicular block.  Low-dose PO amio added on 10/26 and VT has since been quiescent.  He has been ambulating without difficulty.  2.  CAD:  S/p CABG and redo CABG (2008). Last cath in 2014 with with stable anatomy and patency of VG OM, VG LAD, and VG  OM2  LPDA.   Nuc study in 02/2014 in the setting of c/p was abnl with anterior and posterolateral/apical infarct with mild peri-infarct ischemia.  He has been medically managed since with asa, plavix, bb, nitrates, ranexa, and ccb.  He has not been having chest pain.  With worsening LV fxn and VT, we Karl Stevenson need to reconsider an ischemic evaluation/cath.  Karl Stevenson d/w Dr. Elberta Stevenson.  3.  ICM/Chronic systolic CHF:  Euvolemic on exam.  Wt stable.  EF 20-25% by echo yesterday.  Cont bb.  ARB held on admission with possibility of cath.  Karl Stevenson plan to resume once plans for isch eval elucidated.  4.  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  Stable sinus bradycardia w/o significant pauses or evidence of high degree heart block.  Tolerating amio and propranolol currently but may require discontinuation of bb @ some point and/or pacing/ICD.  5. CKD III:  Creat stable.  ARB held on admission. Plan to resume on d/c.  6.  Essential HTN:  Stable.  7.  HL:  Stable.  Signed, Karl Stevenson  I have seen and examined this patient with Karl Stevenson.  Agree with above, note added to reflect my findings.  On exam, regular rhythm, no murmurs.  Had episodes of VT on admission to the hospital.  Started on amiodarone and no longer having VT.  Karl Stevenson plan to discharge on 200 amiodarone.  Karl Stevenson follow up in clinic to discuss possiblity of device for conduction system disease.   Has ischemic myopathy with low EF, no tolerating OMT due to bradycardia.  May benefit from pacing, may avoid icd due to advanced age.    Karl Danner M. Sosaia Pittinger MD 03/07/2015 9:12 AM

## 2015-03-07 NOTE — Progress Notes (Signed)
The clients went into a sustained VT rate at 2122 in the 140's with the only symptom of palpitations. The cardiologist doctor Onalee Hua was paged and he came to evaluate the client. An EKG was obtained, pads were placed, and amiodarone 150 mg IV bolus was ordered and administered. The client was still in VT and a second bolus was ordered. During administration of the second bolus the client converted at 2154. I will continue to monitor the client closely over night.

## 2015-03-07 NOTE — Progress Notes (Signed)
   Case discussed with Drs. Cooper and [employer].  Pt felt to be a good candidate for device therapy/ICD.  Discussed in detail with patient and he wishes to proceed with ICD placement tomorrow.  Nicolasa Ducking, NP

## 2015-03-07 NOTE — Telephone Encounter (Signed)
-----   Message from [name] sent at 03/07/2015  9:41 AM EDT ----- TOC to see [name] on 11/2 @ 1300, going home. Please make sure someone calls pt.  Karl Stevenson

## 2015-03-07 NOTE — Plan of Care (Signed)
Paged by RN to notify of sustained VT at rate of 140 bpm.  He is asymptomatic except for palpitations.  Currently HDS with SBP 140s.    Plan: Place pads in case he becomes unstable Amio 150 mg IV bolus, may repeat if needed If not successful, may consider Lidocaine bolus

## 2015-03-08 ENCOUNTER — Encounter (HOSPITAL_COMMUNITY)
Admission: EM | Disposition: A | Discharge status: Home Health | Payer: Self-pay | Source: Home / Self Care | Attending: Cardiology

## 2015-03-08 DIAGNOSIS — I25118 Atherosclerotic heart disease of native coronary artery with other forms of angina pectoris: Secondary | ICD-10-CM

## 2015-03-08 DIAGNOSIS — N183 Chronic kidney disease, stage 3 (moderate): Secondary | ICD-10-CM

## 2015-03-08 HISTORY — PX: CARDIAC CATHETERIZATION: SHX172

## 2015-03-08 SURGERY — LEFT HEART CATH AND CORONARY ANGIOGRAPHY
Anesthesia: LOCAL

## 2015-03-08 MED ORDER — MIDAZOLAM HCL 2 MG/2ML IJ SOLN
INTRAMUSCULAR | Status: DC | PRN
  Administered 2015-03-08: 1 mg via INTRAVENOUS

## 2015-03-08 MED ORDER — ASPIRIN 81 MG PO CHEW: 81.0000 mg | CHEWABLE_TABLET | ORAL | Status: DC

## 2015-03-08 MED ORDER — IOHEXOL 350 MG/ML SOLN
INTRAVENOUS | Status: DC | PRN
  Administered 2015-03-08: 125 mL via INTRAVENOUS

## 2015-03-08 MED ORDER — LIDOCAINE HCL (PF) 1 % IJ SOLN
INTRAMUSCULAR | Status: AC
  Filled 2015-03-08: qty 30

## 2015-03-08 MED ORDER — NITROGLYCERIN 1 MG/10 ML FOR IR/CATH LAB
INTRA_ARTERIAL | Status: AC
  Filled 2015-03-08: qty 10

## 2015-03-08 MED ORDER — HEPARIN (PORCINE) IN NACL 2-0.9 UNIT/ML-% IJ SOLN
INTRAMUSCULAR | Status: AC
  Filled 2015-03-08: qty 1000

## 2015-03-08 MED ORDER — SODIUM CHLORIDE 0.9 % WEIGHT BASED INFUSION
3.0000 mL/kg/h | INTRAVENOUS | Status: AC
  Administered 2015-03-08: 3 mL/kg/h via INTRAVENOUS

## 2015-03-08 MED ORDER — LIDOCAINE HCL (PF) 1 % IJ SOLN
INTRAMUSCULAR | Status: DC | PRN
  Administered 2015-03-08: 10 mL via INTRA_ARTERIAL

## 2015-03-08 MED ORDER — FENTANYL CITRATE (PF) 100 MCG/2ML IJ SOLN
INTRAMUSCULAR | Status: DC | PRN
  Administered 2015-03-08: 25 ug via INTRAVENOUS

## 2015-03-08 MED ORDER — MIDAZOLAM HCL 2 MG/2ML IJ SOLN
INTRAMUSCULAR | Status: AC
  Filled 2015-03-08: qty 4

## 2015-03-08 MED ORDER — SODIUM CHLORIDE 0.9 % WEIGHT BASED INFUSION: 1.0000 mL/kg/h | INTRAVENOUS | Status: DC

## 2015-03-08 MED ORDER — HEPARIN SODIUM (PORCINE) 1000 UNIT/ML IJ SOLN
INTRAMUSCULAR | Status: AC
  Filled 2015-03-08: qty 1

## 2015-03-08 MED ORDER — SODIUM CHLORIDE 0.9 % IJ SOLN
3.0000 mL | Freq: Two times a day (BID) | INTRAMUSCULAR | Status: DC
Start: 2015-03-08 — End: 2015-03-13
  Administered 2015-03-08 – 2015-03-12 (×5): 3 mL via INTRAVENOUS

## 2015-03-08 MED ORDER — NITROGLYCERIN 1 MG/10 ML FOR IR/CATH LAB
INTRA_ARTERIAL | Status: DC | PRN
  Administered 2015-03-08: 16:00:00

## 2015-03-08 MED ORDER — VERAPAMIL HCL 2.5 MG/ML IV SOLN
INTRAVENOUS | Status: AC
  Filled 2015-03-08: qty 2

## 2015-03-08 MED ORDER — HEPARIN (PORCINE) IN NACL 100-0.45 UNIT/ML-% IJ SOLN
800.0000 [IU]/h | INTRAMUSCULAR | Status: DC
  Administered 2015-03-09: 750 [IU]/h via INTRAVENOUS
  Administered 2015-03-10: 800 [IU]/h via INTRAVENOUS
  Filled 2015-03-08 (×2): qty 250

## 2015-03-08 MED ORDER — SODIUM CHLORIDE 0.9 % IV SOLN: 250.0000 mL | INTRAVENOUS | Status: DC | PRN

## 2015-03-08 MED ORDER — SODIUM CHLORIDE 0.9 % IJ SOLN: 3.0000 mL | Freq: Two times a day (BID) | INTRAMUSCULAR | Status: DC

## 2015-03-08 MED ORDER — FENTANYL CITRATE (PF) 100 MCG/2ML IJ SOLN
INTRAMUSCULAR | Status: AC
  Filled 2015-03-08: qty 4

## 2015-03-08 MED ORDER — RANOLAZINE ER 500 MG PO TB12
1000.0000 mg | ORAL_TABLET | Freq: Two times a day (BID) | ORAL | Status: DC
  Administered 2015-03-08 – 2015-03-13 (×10): 1000 mg via ORAL
  Filled 2015-03-08 (×10): qty 2

## 2015-03-08 MED ORDER — MEXILETINE HCL 200 MG PO CAPS
200.0000 mg | ORAL_CAPSULE | Freq: Two times a day (BID) | ORAL | Status: DC
  Administered 2015-03-08 – 2015-03-13 (×11): 200 mg via ORAL
  Filled 2015-03-08 (×13): qty 1

## 2015-03-08 MED ORDER — LIDOCAINE HCL (PF) 1 % IJ SOLN
INTRAMUSCULAR | Status: DC | PRN
  Administered 2015-03-08: 3 mL

## 2015-03-08 MED ORDER — SODIUM CHLORIDE 0.9 % IJ SOLN: 3.0000 mL | INTRAMUSCULAR | Status: DC | PRN

## 2015-03-08 MED ORDER — HEPARIN SODIUM (PORCINE) 1000 UNIT/ML IJ SOLN
INTRAMUSCULAR | Status: DC | PRN
  Administered 2015-03-08: 3000 [IU] via INTRAVENOUS

## 2015-03-08 MED ORDER — SODIUM CHLORIDE 0.9 % WEIGHT BASED INFUSION: 3.0000 mL/kg/h | INTRAVENOUS | Status: DC

## 2015-03-08 MED ORDER — ASPIRIN EC 81 MG PO TBEC
81.0000 mg | DELAYED_RELEASE_TABLET | Freq: Every day | ORAL | Status: DC
  Administered 2015-03-08 – 2015-03-13 (×6): 81 mg via ORAL
  Filled 2015-03-08 (×6): qty 1

## 2015-03-08 SURGICAL SUPPLY — 10 items
CATH INFINITI 5FR MULTPACK ANG (CATHETERS) ×1 IMPLANT
DEVICE RAD COMP TR BAND LRG (VASCULAR PRODUCTS) ×2 IMPLANT
GLIDESHEATH SLEND A-KIT 6F 22G (SHEATH) ×2 IMPLANT
KIT HEART LEFT (KITS) ×2 IMPLANT
PACK CARDIAC CATHETERIZATION (CUSTOM PROCEDURE TRAY) ×2 IMPLANT
SYR MEDRAD MARK V 150ML (SYRINGE) ×2 IMPLANT
TRANSDUCER W/STOPCOCK (MISCELLANEOUS) ×2 IMPLANT
TUBING CIL FLEX 10 FLL-RA (TUBING) ×2 IMPLANT
WIRE HITORQ VERSACORE ST 145CM (WIRE) ×1 IMPLANT
WIRE SAFE-T 1.5MM-J .035X260CM (WIRE) ×2 IMPLANT

## 2015-03-08 NOTE — Brief Op Note (Signed)
   NAME:  Karl Stevenson   MRN: [id number] DOB:  [birth year]   ADMIT DATE: 03/05/2015  Brief Cardia Catheterization Note:  Indication: 1. Sustained Ventricular Tachycardia 2. Known Ischemic Cardiomyopathy 3. Severe CAD status post redo CABG - SVG-OM 2-OM 3 with Y graft SVG- LAD, old SVG-OM1 with atretic LIMA, occluded right system grafts and native.  Procedures: 1. Left Heart Catheterization with Graft Angiography via Right Radial Artery access  Right radial access, Seldinger technique.  JR4 catheter used for selective angiography of the Y graft SVG as well as the old SVG-OM1. The catheter was then advanced across the aortic valve measurement of left hemodynamics. Into the LAD was not performed due to the patient's chronic renal insufficiency.  Medications:  1 mg Versed IV; 25 mcg Fentanyl IV  3000 Units IV Heparin Radial Cocktail: 5 mg Verapamil, 400 mcg NTG, 2 ml 2% Lidocaine in 10 ml NS - 5 mL given   Impression:  Known severe native coronary disease. Disabled contrast the native arteries were not evaluated.  Occluded old SVG-OM1 - the dye remained standing in the proximal vessel for several minutes following angiography - not PCI amenable  Widely patent Y graft to provide sequential grafting to OM1 and OM 2 as well as graft to the LAD which retrograde fills the diagonal branch.  Known occlusion of the right coronary system with no grafts available  Borderline low LVEDP   Recommendations:  No PCI targets.  Standard post radial cath care with TR band removal  Would proceed with ICD placement per EP  Full note to follow  HARDING, Piedad Climes, M.D., M.S. Interventional Cardiologist   Pager # [phone]  03/08/2015 4:20 PM

## 2015-03-08 NOTE — H&P (View-Only) (Signed)
Patient Name: Karl Stevenson      SUBJECTIVE: Patient admitted with ventricular tachycardia identified on an event recorder because of progressive frequency of tachypalpitations. Since admission, intravenous amiodarone was discontinued because of bradycardia. He had recurrent ventricular tachycardia again last night seen with intravenous amiodarone. He is currently in sinus rhythm. Heart rates over the last 24 hours have ranged from the mid 40s--low 60s.  Ventricular tachycardia events have been associated with some shortness of breath he denies lightheadedness or chest discomfort. He has noted no significant change in exercise tolerance.  LVEF was noted to be about 25% by echo. Myoview 2013 demonstrated ejection fraction of 23% catheterization 2014 had an ejection fraction of 40%. Grafts at that time were patent.  Past Medical History  Diagnosis Date  . Hyperlipidemia   . Hypertension   . Carotid artery occlusion     a. s/p L CEA in 2010;  b. 06/2014 Carotid U/S: stable LICA CEA site w/o significant stenosis bilat.  . Crohn's disease (HCC)     a. s/p colon resection  . Bradycardia     a. previous intolerance to beta blockers - low dose toprol started 10/2011 for tachycardia but had rash and was discontinued;  b. tolerating low dose propranolol.  . Chronic systolic heart failure (HCC)     a. 02/2015 Echo:  35-40% (echo 11/2010); c. 02/2015 Echo: EF 20-25%, diff HK, mid-apicalanteroseptal and apical AK, Gr 1 DD, mild MR.  . Ischemic cardiomyopathy     a. EF 34% (Lexiscan 06/2011); b. 35-40% (echo 11/2010); c. 02/2015 Echo: EF 20-25%, diff HK, mid-apicalanteroseptal and apical AK, Gr 1 DD, mild MR.  . Coronary artery disease     a. 1969 s/p MI;  b. S/P CABG 1987;  c. S/P redo 2008;  d. 07/2012 Cath: LM 95, LAD 100p, 95apical after graft insertion, D1 70, LCX 100p, VG->OM1 60d, VG->mLAD from VG->OM (Y graft) nl, VG->OM2->LPDA patent, LIMA->LAD known atresis, EF 40%; e. 02/2014 MV:  anterior and posterolateral/apical infarct with mild peri-infarct ischemia->Med Rx.  . Peripheral vascular disease (HCC)     a. Carotid dz s/p L CEA, RAS, AAA, LE dz  . AAA (abdominal aortic aneurysm) (HCC)     a. Ectatic abdominal aorta with fusiform dilitation 3.4x3.4 and moderate thrombus burden 12/2010  . RBBB   . Tachycardia     a. 10/2011 - atrial tach vs avnrt - BB started.  [name] COPD (chronic obstructive pulmonary disease) (HCC)   . Ventricular tachycardia (HCC)     a. 02/2015 ->amio 200 daily added.  . Gout   . Renal artery stenosis (HCC)     a. Dopplers 12/2010 - 1-59% R ostial renal artery stenosis, 2 L renal arteries both widely patent    Scheduled Meds:  Scheduled Meds: . amiodarone  200 mg Oral Daily  . amLODipine  5 mg Oral Daily  . aspirin EC  81 mg Oral Daily  . atorvastatin  10 mg Oral QHS  .  ceFAZolin (ANCEF) IV  2 g Intravenous To Cath  . clopidogrel  75 mg Oral Daily  . enoxaparin (LOVENOX) injection  40 mg Subcutaneous Q24H  . gentamicin irrigation  80 mg Irrigation To Cath  . isosorbide mononitrate  120 mg Oral Daily  . pantoprazole  40 mg Oral Daily  . propranolol  10 mg Oral BID  . ranolazine  500 mg Oral BID   Continuous Infusions: . sodium chloride 10 mL/hr at 03/08/15 [phone]  acetaminophen, colchicine, ipratropium, nitroGLYCERIN, ondansetron (ZOFRAN) IV    PHYSICAL EXAM Filed Vitals:   03/08/15 0226 03/08/15 0500 03/08/15 0805 03/08/15 1013  BP: 136/71 122/67 109/59 109/62  Pulse: 63 57 54 55  Temp:  98.4 F (36.9 C) 98.2 F (36.8 C)   TempSrc:  Oral Oral   Resp: 17 19 18   Height:      Weight:      SpO2: 97% 95% 93%     Well developed and nourished in no acute distress HENT normal Neck supple with JVP-flat Clear Regular rate and rhythm, no murmurs or gallops Abd-soft with active BS No Clubbing cyanosis edema Skin-warm and dry A & Oriented  Grossly normal sensory and motor function   TELEMETRY: Reviewed telemetry pt in sinus bradt  and VT:    Intake/Output Summary (Last 24 hours) at 03/08/15 1143 Last data filed at 03/08/15 0921  Gross per 24 hour  Intake      0 ml  Output    450 ml  Net   -450 ml    LABS: Basic Metabolic Panel:  Recent Labs Lab 03/05/15 1247 03/05/15 1520 03/05/15 1526 03/06/15 0341  NA 139  --  142 136  K 3.9  --  4.1 3.8  CL 105  --  103 101  CO2 26  --   --  27  GLUCOSE 111*  --  101* 106*  BUN 22  --  23* 22*  CREATININE 1.35*  --  1.40* 1.41*  CALCIUM 9.3  --   --  8.7*  MG 1.5 1.6*  --   --   PHOS  --  3.3  --   --    Cardiac Enzymes: No results for input(s): CKTOTAL, CKMB, CKMBINDEX, TROPONINI in the last 72 hours. CBC:  Recent Labs Lab 03/05/15 1520 03/05/15 1526  WBC 5.9  --   HGB 12.4* 13.3  HCT 37.1* 39.0  MCV 99.5  --   PLT 145*  --    PROTIME:  Recent Labs  03/05/15 1520 03/06/15 0341  LABPROT 15.1 15.3*  INR 1.17 1.20       Recent Labs  03/06/15 0341  CHOL 99  HDL 30*  LDLCALC 41  TRIG 138  CHOLHDL 3.3     ASSESSMENT AND PLAN:  Principal Problem:   Ventricular tachycardia (HCC) Active Problems:   Crohn's disease (HCC)   CAD (coronary artery disease)   HTN (hypertension)   PVD (peripheral vascular disease) (HCC)   Carotid stenosis   Ischemic cardiomyopathy   Chronic systolic heart failure (HCC)   RBBB   CKD (chronic kidney disease), stage III   NSVT (nonsustained ventricular tachycardia) (HCC)   The patient has had increasingly frequent ventricular tachycardia over recent weeks and months. It will require medical therapy for suppression. The adjunctive advantage of the device is not clear in this cohort although antiarrhythmic therapy may result in aggravation of bradycardia and conduction system disease prompting device implantation.  The other issue that is begged is why the flurry of VT. I am concerned about progressive coronary disease. I discussed this with Dr. Cooper and we will change the plan from a ICD today to  catheterization. In addition, we will continue him on ranolazine as an antiarrhythmic adjunct but will increase it to 1000 mg twice a day and I will use mexiletine as an antiarrhythmic as well. The frequency of his ventricular tachycardia has been 3-4 times per week of late and so hopefully over the next   3-4 days if we see no more VT we might begin to infer that we have have antiarrhythmic suppression  Hopefully we will be able to avoid an ICD in this gentleman   Signed, Sherryl Manges MD  03/08/2015

## 2015-03-08 NOTE — Progress Notes (Signed)
ANTICOAGULATION CONSULT NOTE - Initial Consult  Pharmacy Consult for heparin Indication: chest pain/ACS- 6h post TR band removal  Allergies  Allergen Reactions  . Novocain [Procaine Hcl] Other (See Comments)    Cold sweats and very bad headache.   . Metoprolol Hives, Itching and Rash    Patient Measurements: Height:  (167.6 cm) Weight: 131 lb 13.4 oz (59.8 kg) IBW/kg (Calculated) : 63.8   Vital Signs: Temp: 98.3 F (36.8 C) (10/28 1243) Temp Source: Oral (10/28 1243) BP: 121/66 mmHg (10/28 1611) Pulse Rate: 0 (10/28 1616)  Labs:  Recent Labs  03/06/15 0341  LABPROT 15.3*  INR 1.20  CREATININE 1.41*    Estimated Creatinine Clearance: 31.8 mL/min (by C-G formula based on Cr of 1.41).    Assessment: 25 YOM s/p cath to start on heparin 6h post TR band removal. Patient to have ICD placed.  Sheath removed and TR band placed at 1608. Spoke with RN Alphonzo Lemmings and she expects TR band to be deflated (barrin complications) ~1830 tonight.  No excessive bleeding noted from cath. Baseline Hgb from 10/25 12.4 with plts 145.  Goal of Therapy:  Heparin level 0.3-0.7 units/ml Monitor platelets by anticoagulation protocol: Yes   Plan:  -stop prophylactic Lovenox -start heparin at 0100 at rate of 750units/hr (~13units/kg/hr) -will confirm TR band is deflated and timing does not need to change -first heparin level at 0900 -daily HL and CBC -follow ICD placement plans and any need to hold heparin  Carlon Chaloux D. Reggie Bise, PharmD, BCPS Clinical Pharmacist Pager: [phone] 03/08/2015 4:55 PM

## 2015-03-08 NOTE — Progress Notes (Signed)
Patient Name: Karl Stevenson      SUBJECTIVE: Patient admitted with ventricular tachycardia identified on an event recorder because of progressive frequency of tachypalpitations. Since admission, intravenous amiodarone was discontinued because of bradycardia. He had recurrent ventricular tachycardia again last night seen with intravenous amiodarone. He is currently in sinus rhythm. Heart rates over the last 24 hours have ranged from the mid 40s--low 60s.  Ventricular tachycardia events have been associated with some shortness of breath he denies lightheadedness or chest discomfort. He has noted no significant change in exercise tolerance.  LVEF was noted to be about 25% by echo. Myoview 2013 demonstrated ejection fraction of 23% catheterization 2014 had an ejection fraction of 40%. Grafts at that time were patent.  Past Medical History  Diagnosis Date  . Hyperlipidemia   . Hypertension   . Carotid artery occlusion     a. s/p L CEA in 2010;  b. 06/2014 Carotid U/S: stable LICA CEA site w/o significant stenosis bilat.  . Crohn's disease (HCC)     a. s/p colon resection  . Bradycardia     a. previous intolerance to beta blockers - low dose toprol started 10/2011 for tachycardia but had rash and was discontinued;  b. tolerating low dose propranolol.  . Chronic systolic heart failure (HCC)     a. 02/2015 Echo:  35-40% (echo 11/2010); c. 02/2015 Echo: EF 20-25%, diff HK, mid-apicalanteroseptal and apical AK, Gr 1 DD, mild MR.  . Ischemic cardiomyopathy     a. EF 34% (Lexiscan 06/2011); b. 35-40% (echo 11/2010); c. 02/2015 Echo: EF 20-25%, diff HK, mid-apicalanteroseptal and apical AK, Gr 1 DD, mild MR.  . Coronary artery disease     a. 1969 s/p MI;  b. S/P CABG 1987;  c. S/P redo 2008;  d. 07/2012 Cath: LM 95, LAD 100p, 95apical after graft insertion, D1 70, LCX 100p, VG->OM1 60d, VG->mLAD from VG->OM (Y graft) nl, VG->OM2->LPDA patent, LIMA->LAD known atresis, EF 40%; e. 02/2014 MV:  anterior and posterolateral/apical infarct with mild peri-infarct ischemia->Med Rx.  . Peripheral vascular disease (HCC)     a. Carotid dz s/p L CEA, RAS, AAA, LE dz  . AAA (abdominal aortic aneurysm) (HCC)     a. Ectatic abdominal aorta with fusiform dilitation 3.4x3.4 and moderate thrombus burden 12/2010  . RBBB   . Tachycardia     a. 10/2011 - atrial tach vs avnrt - BB started.  [name] COPD (chronic obstructive pulmonary disease) (HCC)   . Ventricular tachycardia (HCC)     a. 02/2015 ->amio 200 daily added.  . Gout   . Renal artery stenosis (HCC)     a. Dopplers 12/2010 - 1-59% R ostial renal artery stenosis, 2 L renal arteries both widely patent    Scheduled Meds:  Scheduled Meds: . amiodarone  200 mg Oral Daily  . amLODipine  5 mg Oral Daily  . aspirin EC  81 mg Oral Daily  . atorvastatin  10 mg Oral QHS  .  ceFAZolin (ANCEF) IV  2 g Intravenous To Cath  . clopidogrel  75 mg Oral Daily  . enoxaparin (LOVENOX) injection  40 mg Subcutaneous Q24H  . gentamicin irrigation  80 mg Irrigation To Cath  . isosorbide mononitrate  120 mg Oral Daily  . pantoprazole  40 mg Oral Daily  . propranolol  10 mg Oral BID  . ranolazine  500 mg Oral BID   Continuous Infusions: . sodium chloride 10 mL/hr at 03/08/15 [phone]  acetaminophen, colchicine, ipratropium, nitroGLYCERIN, ondansetron (ZOFRAN) IV    PHYSICAL EXAM Filed Vitals:   03/08/15 0226 03/08/15 0500 03/08/15 0805 03/08/15 1013  BP: 136/71 122/67 109/59 109/62  Pulse: 63 57 54 55  Temp:  98.4 F (36.9 C) 98.2 F (36.8 C)   TempSrc:  Oral Oral   Resp: Height:      Weight:      SpO2: 97% 95% 93%     Well developed and nourished in no acute distress HENT normal Neck supple with JVP-flat Clear Regular rate and rhythm, no murmurs or gallops Abd-soft with active BS No Clubbing cyanosis edema Skin-warm and dry A & Oriented  Grossly normal sensory and motor function   TELEMETRY: Reviewed telemetry pt in sinus bradt  and VT:    Intake/Output Summary (Last 24 hours) at 03/08/15 1143 Last data filed at 03/08/15 0921  Gross per 24 hour  Intake      0 ml  Output    450 ml  Net   -450 ml    LABS: Basic Metabolic Panel:  Recent Labs Lab 03/05/15 1247 03/05/15 1520 03/05/15 1526 03/06/15 0341  NA 139  --  142 136  K 3.9  --  4.1 3.8  CL 105  --  103 101  CO2 26  --   --  27  GLUCOSE 111*  --  101* 106*  BUN 22  --  23* 22*  CREATININE 1.35*  --  1.40* 1.41*  CALCIUM 9.3  --   --  8.7*  MG 1.5 1.6*  --   --   PHOS  --  3.3  --   --    Cardiac Enzymes: No results for input(s): CKTOTAL, CKMB, CKMBINDEX, TROPONINI in the last 72 hours. CBC:  Recent Labs Lab 03/05/15 1520 03/05/15 1526  WBC 5.9  --   HGB 12.4* 13.3  HCT 37.1* 39.0  MCV 99.5  --   PLT 145*  --    PROTIME:  Recent Labs  03/05/15 1520 03/06/15 0341  LABPROT 15.1 15.3*  INR 1.17 1.20       Recent Labs  03/06/15 0341  CHOL 99  HDL 30*  LDLCALC 41  TRIG 130  CHOLHDL 3.3     ASSESSMENT AND PLAN:  Principal Problem:   Ventricular tachycardia (HCC) Active Problems:   Crohn's disease (HCC)   CAD (coronary artery disease)   HTN (hypertension)   PVD (peripheral vascular disease) (HCC)   Carotid stenosis   Ischemic cardiomyopathy   Chronic systolic heart failure (HCC)   RBBB   CKD (chronic kidney disease), stage III   NSVT (nonsustained ventricular tachycardia) (HCC)   The patient has had increasingly frequent ventricular tachycardia over recent weeks and months. It will require medical therapy for suppression. The adjunctive advantage of the device is not clear in this cohort although antiarrhythmic therapy may result in aggravation of bradycardia and conduction system disease prompting device implantation.  The other issue that is begged is why the flurry of VT. I am concerned about progressive coronary disease. I discussed this with Dr. Excell Seltzer and we will change the plan from a ICD today to  catheterization. In addition, we will continue him on ranolazine as an antiarrhythmic adjunct but will increase it to 1000 mg twice a day and I will use mexiletine as an antiarrhythmic as well. The frequency of his ventricular tachycardia has been 3-4 times per week of late and so hopefully over the next  3-4 days if we see no more VT we might begin to infer that we have have antiarrhythmic suppression  Hopefully we will be able to avoid an ICD in this gentleman   Signed, Sherryl Manges MD  03/08/2015

## 2015-03-08 NOTE — Interval H&P Note (Signed)
History and Physical Interval Note:  03/08/2015 3:20 PM  Karl Stevenson  has presented today for surgery, with the diagnosis of Sustained VT with known ICM & CAD-CABG.   The various methods of treatment have been discussed with the patient and family. After consideration of risks, benefits and other options for treatment, the patient has consented to  Procedure(s): Left Heart Cath and Coronary Angiography (N/A)  With  Graft angiography and Possible Percutaneous Coronary Intervention  as a surgical intervention .  The patient's history has been reviewed, patient examined, no change in status, stable for surgery.  I have reviewed the patient's chart and labs.  Questions were answered to the patient's satisfaction.     HARDING, DAVID W  Cath Lab Visit (complete for each Cath Lab visit)  Clinical Evaluation Leading to the Procedure:   ACS: No.  Non-ACS:    Anginal Classification: No Symptoms - VT  Anti-ischemic medical therapy: Maximal Therapy (2 or more classes of medications)  Non-Invasive Test Results: No non-invasive testing performed  Prior CABG: Previous CABG  AUC FOR CATH CAD Assessment (Coronary Angiography With or Without Left Heart Catheterization and/or Left Ventriculography)  Patient Information:    Arrhythmias   Etiology Unclear After Initial Evaluation   VF or sustained VT (?6 beats VT) with or without symptoms  AUC Score:   A (8)   Indication:   58  FOR PCI  A conclusive assessment cannot be generated from this patient's information.   [name], MD

## 2015-03-08 NOTE — Care Management Important Message (Signed)
Important Message  Patient Details  Name: LOMAR MEADOR MRN: [id number] Date of Birth: 1927/03/16   Medicare Important Message Given:  Yes-second notification given    Kyla Balzarine 03/08/2015, 1:38 PM

## 2015-03-08 NOTE — Telephone Encounter (Signed)
Pt currently still in hospital for possible cath today.

## 2015-03-09 LAB — CBC
HEMATOCRIT: 37.3 % — AB (ref 39.0–52.0)
Hemoglobin: 12.7 g/dL — ABNORMAL LOW (ref 13.0–17.0)
MCH: 34.1 pg — ABNORMAL HIGH (ref 26.0–34.0)
MCHC: 34 g/dL (ref 30.0–36.0)
MCV: 100.3 fL — ABNORMAL HIGH (ref 78.0–100.0)
PLATELETS: 134 10*3/uL — AB (ref 150–400)
RBC: 3.72 MIL/uL — ABNORMAL LOW (ref 4.22–5.81)
RDW: 13 % (ref 11.5–15.5)
WBC: 5.2 10*3/uL (ref 4.0–10.5)

## 2015-03-09 LAB — BASIC METABOLIC PANEL
Anion gap: 12 (ref 5–15)
BUN: 22 mg/dL — AB (ref 6–20)
CALCIUM: 8.5 mg/dL — AB (ref 8.9–10.3)
CHLORIDE: 105 mmol/L (ref 101–111)
CO2: 22 mmol/L (ref 22–32)
CREATININE: 1.46 mg/dL — AB (ref 0.61–1.24)
GFR calc Af Amer: 48 mL/min — ABNORMAL LOW (ref >60)
GFR calc non Af Amer: 42 mL/min — ABNORMAL LOW (ref >60)
GLUCOSE: 90 mg/dL (ref 65–99)
Potassium: 3.9 mmol/L (ref 3.5–5.1)
Sodium: 139 mmol/L (ref 135–145)

## 2015-03-09 LAB — HEPARIN LEVEL (UNFRACTIONATED)
Heparin Unfractionated: 0.3 IU/mL (ref 0.30–0.70)
Heparin Unfractionated: 0.43 IU/mL (ref 0.30–0.70)

## 2015-03-09 MED ORDER — DOCUSATE SODIUM 100 MG PO CAPS
100.0000 mg | ORAL_CAPSULE | Freq: Two times a day (BID) | ORAL | Status: DC | PRN
  Administered 2015-03-09 – 2015-03-12 (×3): 100 mg via ORAL
  Filled 2015-03-09 (×3): qty 1

## 2015-03-09 NOTE — Progress Notes (Signed)
Patient ID: Karl Stevenson, male   DOB: [birth year], 81 y.o.   MRN: [id number]    Patient Name: Karl Stevenson Date of Encounter: 03/09/2015     Principal Problem:   Ventricular tachycardia (HCC) Active Problems:   Crohn's disease (HCC)   CAD (coronary artery disease)   HTN (hypertension)   PVD (peripheral vascular disease) (HCC)   Carotid stenosis   Ischemic cardiomyopathy   Chronic systolic heart failure (HCC)   RBBB   CKD (chronic kidney disease), stage III   NSVT (nonsustained ventricular tachycardia) (HCC)    SUBJECTIVE  No chest pain, sob, or palpitations.   CURRENT MEDS . amiodarone  200 mg Oral Daily  . amLODipine  5 mg Oral Daily  . aspirin EC  81 mg Oral Daily  . atorvastatin  10 mg Oral QHS  . clopidogrel  75 mg Oral Daily  . isosorbide mononitrate  120 mg Oral Daily  . mexiletine  200 mg Oral Q12H  . pantoprazole  40 mg Oral Daily  . ranolazine  1,000 mg Oral BID  . sodium chloride  3 mL Intravenous Q12H    OBJECTIVE  Filed Vitals:   03/08/15 1641 03/08/15 2100 03/09/15 0032 03/09/15 0400  BP: 110/63 104/61 110/58 111/59  Pulse: 61  65 71  Temp: 98 F (36.7 C) 98.1 F (36.7 C) 97.9 F (36.6 C) 97.8 F (36.6 C)  TempSrc: Oral Oral Oral Oral  Resp: 18 18    Height:      Weight:    132 lb 9.6 oz (60.147 kg)  SpO2: 94% 95% 96% 96%    Intake/Output Summary (Last 24 hours) at 03/09/15 0849 Last data filed at 03/09/15 0700  Gross per 24 hour  Intake      0 ml  Output    795 ml  Net   -795 ml   Filed Weights   03/07/15 0400 03/08/15 1154 03/09/15 0400  Weight: 131 lb 14.4 oz (59.829 kg) 131 lb 13.4 oz (59.8 kg) 132 lb 9.6 oz (60.147 kg)    PHYSICAL EXAM  General: Pleasant, elderly man, NAD. Neuro: Alert and oriented X 3. Moves all extremities spontaneously. Psych: Normal affect. HEENT:  Normal  Neck: Supple without bruits or JVD. Lungs:  Resp regular and unlabored, CTA. Heart: RRR no s3, s4, or murmurs. Abdomen: Soft, non-tender,  non-distended, BS + x 4.  Extremities: No clubbing, cyanosis or edema. DP/PT/Radials 2+ and equal bilaterally.  Accessory Clinical Findings  CBC No results for input(s): WBC, NEUTROABS, HGB, HCT, MCV, PLT in the last 72 hours. Basic Metabolic Panel  Recent Labs  03/09/15 0410  NA 139  K 3.9  CL 105  CO2 22  GLUCOSE 90  BUN 22*  CREATININE 1.46*  CALCIUM 8.5*   Liver Function Tests No results for input(s): AST, ALT, ALKPHOS, BILITOT, PROT, ALBUMIN in the last 72 hours. No results for input(s): LIPASE, AMYLASE in the last 72 hours. Cardiac Enzymes No results for input(s): CKTOTAL, CKMB, CKMBINDEX, TROPONINI in the last 72 hours. BNP Invalid input(s): POCBNP D-Dimer No results for input(s): DDIMER in the last 72 hours. Hemoglobin A1C No results for input(s): HGBA1C in the last 72 hours. Fasting Lipid Panel No results for input(s): CHOL, HDL, LDLCALC, TRIG, CHOLHDL, LDLDIRECT in the last 72 hours. Thyroid Function Tests No results for input(s): TSH, T4TOTAL, T3FREE, THYROIDAB in the last 72 hours.  Invalid input(s): FREET3  TELE  NSR with occaisional PVC's.  Radiology/Studies  Dg Chest [facility] 1 [address]  03/05/2015  CLINICAL DATA:  tachcardia EXAM: PORTABLE CHEST - 1 VIEW COMPARISON:  02/12/2014 FINDINGS: Previous CABG. Atheromatous aorta. Heart size normal. Lungs clear. Surgical clips at the thoracic inlet on the left. Multiple monitoring leads overlie the patient. No definite pneumothorax. No effusion. IMPRESSION: 1. No acute disease post CABG. Electronically Signed   By: Corlis Leak M.D.   On: 03/05/2015 16:06    ASSESSMENT AND PLAN  1. VT - maintaining NSR. He will continue his current regimen. Note plans for ICD on Monday as per patient. 2. ICM - note results of heart cath. He has advanced CAD which will be treated with medications. No revascularization options.  3. Chronic systolic heart failure - he remains well compensated, class 2.   Gregg  Taylor,M.D.  03/09/2015 8:49 AM

## 2015-03-09 NOTE — Progress Notes (Signed)
ANTICOAGULATION CONSULT NOTE - Initial Consult  Pharmacy Consult for heparin Indication: chest pain/ACS  Allergies  Allergen Reactions  . Novocain [Procaine Hcl] Other (See Comments)    Cold sweats and very bad headache.   . Metoprolol Hives, Itching and Rash    Patient Measurements: Height: 5\' 6"  (167.6 cm) Weight: 132 lb 9.6 oz (60.147 kg) IBW/kg (Calculated) : 63.8 Heparin Dosing Weight: 60kg  Vital Signs: Temp: 97.9 F (36.6 C) (10/29 0845) Temp Source: Oral (10/29 0400) BP: 110/62 mmHg (10/29 0929) Pulse Rate: 82 (10/29 0845)  Labs:  Recent Labs  03/09/15 0410 03/09/15 0920  HGB  --  12.7*  HCT  --  37.3*  PLT  --  134*  HEPARINUNFRC  --  0.30  CREATININE 1.46*  --     Estimated Creatinine Clearance: 30.9 mL/min (by C-G formula based on Cr of 1.46).  Assessment: 87 YOM s/p cath to start on heparin 6h post TR band removal. Patient to have ICD placed.  Sheath removed and TR band placed at 1608 on 10/28. TR band deflated ~1830 and heparin restarted 10/29 at ~ 0130. HL 0.30 therapeutic   No excessive bleeding noted from cath and no bleeding noted today, Baseline Hgb from 10/25 12.4 with plts 145.  Goal of Therapy:  Heparin level 0.3-0.7 units/ml Monitor platelets by anticoagulation protocol: Yes   Plan:  -increase heparin at rate of 800units/hr to ensure patient remains within therapeutic range  -confirm HL at 1900 -daily HL and CBC -follow ICD placement plans and any need to hold heparin  Remi Haggard, PharmD Clinical Pharmacist- Resident Pager: [phone]  03/09/2015 9:58 AM

## 2015-03-09 NOTE — Progress Notes (Signed)
Pt well-known to me from outpatient practice. He seems to be in good spirits today. Plans noted for ICD Monday. Appreciate care of EP team.   [name] 03/09/2015 11:59 AM

## 2015-03-09 NOTE — Progress Notes (Signed)
ANTICOAGULATION CONSULT NOTE - Initial Consult  Pharmacy Consult for heparin Indication: chest pain/ACS  Allergies  Allergen Reactions  . Novocain [Procaine Hcl] Other (See Comments)    Cold sweats and very bad headache.   . Metoprolol Hives, Itching and Rash    Patient Measurements: Height: 5\' 6"  (167.6 cm) Weight: 132 lb 9.6 oz (60.147 kg) IBW/kg (Calculated) : 63.8 Heparin Dosing Weight: 60kg  Vital Signs: Temp: 98.2 F (36.8 C) (10/29 1630) Temp Source: Oral (10/29 1630) BP: 111/63 mmHg (10/29 1630) Pulse Rate: 70 (10/29 1630)  Labs:  Recent Labs  03/09/15 0410 03/09/15 0920 03/09/15 1832  HGB  --  12.7*  --   HCT  --  37.3*  --   PLT  --  134*  --   HEPARINUNFRC  --  0.30 0.43  CREATININE 1.46*  --   --     Estimated Creatinine Clearance: 30.9 mL/min (by C-G formula based on Cr of 1.46).  Assessment: 45 YOM s/p cath to start on heparin 6h post TR band removal. Patient to have ICD placed.  Sheath removed and TR band placed at 1608 on 10/28. TR band deflated ~1830 and heparin restarted 10/29 at ~ 0130. HL 0.43 therapeutic   No excessive bleeding noted from cath and no bleeding noted today, Baseline Hgb from 10/25 12.4 with plts 145.  Goal of Therapy:  Heparin level 0.3-0.7 units/ml Monitor platelets by anticoagulation protocol: Yes   Plan:  -Continue heparin at 800 units/hr -daily HL and CBC -follow ICD placement plans and any need to hold heparin  Isaac Bliss, PharmD, BCPS Clinical Pharmacist Pager [phone] 03/09/2015 7:10 PM

## 2015-03-10 LAB — CBC
HCT: 34.9 % — ABNORMAL LOW (ref 39.0–52.0)
Hemoglobin: 11.7 g/dL — ABNORMAL LOW (ref 13.0–17.0)
MCH: 33.2 pg (ref 26.0–34.0)
MCHC: 33.5 g/dL (ref 30.0–36.0)
MCV: 99.1 fL (ref 78.0–100.0)
PLATELETS: 137 10*3/uL — AB (ref 150–400)
RBC: 3.52 MIL/uL — ABNORMAL LOW (ref 4.22–5.81)
RDW: 12.9 % (ref 11.5–15.5)
WBC: 5 10*3/uL (ref 4.0–10.5)

## 2015-03-10 LAB — HEPARIN LEVEL (UNFRACTIONATED): HEPARIN UNFRACTIONATED: 0.51 [IU]/mL (ref 0.30–0.70)

## 2015-03-10 MED ORDER — SODIUM CHLORIDE 0.9 % IV SOLN
INTRAVENOUS | Status: DC
  Administered 2015-03-11: 06:00:00 via INTRAVENOUS

## 2015-03-10 MED ORDER — CEFAZOLIN SODIUM-DEXTROSE 2-3 GM-% IV SOLR
2.0000 g | INTRAVENOUS | Status: DC
  Filled 2015-03-10: qty 50

## 2015-03-10 MED ORDER — SODIUM CHLORIDE 0.9 % IR SOLN
80.0000 mg | Status: DC
  Filled 2015-03-10: qty 2

## 2015-03-10 NOTE — Significant Event (Signed)
Rapid Response Event Note  Overview: Time Called: 1335 Arrival Time: 1340    Initial Focused Assessment:  Called by RN to see patient with c/o bilateral numbness on feet, unsteady and shaking.   Upon my arrival to patients room, patient sitting in chair alert and oriented.  Patient states bilateral numbness on feet which goes to mid calf and unsteadiness started late yesterday evening.  Patient states his feet fell like wood and are stiff.     Interventions:  Examined bilateral feet, both are cool to touch, pulses 2+, capillary refill brisk.  Able to distinguish touch on bottoms of feet, strength 5/5 bilaterally.  Patient wanted to get up and walk, patient slightly unsteady at the beginning, gait belt applied and used walker, balance improved during walk, states "feet still are like wood".  Patient assisted back to chair.  Instructed patient to move legs and feet while sitting and to get up and walk with assistance whenever possible.  MD paged and updated and at bedside.   Event Summary:  RN to call if assistance needed   at      at          [facility], [place]es Amos

## 2015-03-10 NOTE — Progress Notes (Signed)
Called to room by pt, pt states he is having new weakness, unsteadiness and numbness, weakness poor balance and shaking noted, pt states this is a new condition which started suddenly yesterday afternoon, rapid response in room to evaluate, MD notified, will continue to monitor closely.

## 2015-03-10 NOTE — Progress Notes (Signed)
Patient ID: ABDULRAHEEM PINEO, male   DOB: 03-31-1927, 81 y.o.   MRN: [id number]    Patient Name: Karl Stevenson Date of Encounter: 03/10/2015     Principal Problem:   Ventricular tachycardia (HCC) Active Problems:   Crohn's disease (HCC)   CAD (coronary artery disease)   HTN (hypertension)   PVD (peripheral vascular disease) (HCC)   Carotid stenosis   Ischemic cardiomyopathy   Chronic systolic heart failure (HCC)   RBBB   CKD (chronic kidney disease), stage III   NSVT (nonsustained ventricular tachycardia) (HCC)    SUBJECTIVE  No chest pain or sob. VT has settled down.  CURRENT MEDS . amiodarone  200 mg Oral Daily  . amLODipine  5 mg Oral Daily  . aspirin EC  81 mg Oral Daily  . atorvastatin  10 mg Oral QHS  . clopidogrel  75 mg Oral Daily  . isosorbide mononitrate  120 mg Oral Daily  . mexiletine  200 mg Oral Q12H  . pantoprazole  40 mg Oral Daily  . ranolazine  1,000 mg Oral BID  . sodium chloride  3 mL Intravenous Q12H    OBJECTIVE  Filed Vitals:   03/09/15 1116 03/09/15 1630 03/09/15 2050 03/10/15 0419  BP: 96/53 111/63 134/73 135/79  Pulse: 74 70 97 92  Temp: 97.8 F (36.6 C) 98.2 F (36.8 C) 97.9 F (36.6 C) 97.6 F (36.4 C)  TempSrc: Oral Oral Oral Oral  Resp: Height:      Weight:    131 lb 8 oz (59.648 kg)  SpO2: 97% 98% 98% 98%    Intake/Output Summary (Last 24 hours) at 03/10/15 0840 Last data filed at 03/09/15 1805  Gross per 24 hour  Intake    310 ml  Output      0 ml  Net    310 ml   Filed Weights   03/08/15 1154 03/09/15 0400 03/10/15 0419  Weight: 131 lb 13.4 oz (59.8 kg) 132 lb 9.6 oz (60.147 kg) 131 lb 8 oz (59.648 kg)    PHYSICAL EXAM  General: Pleasant, NAD. Neuro: Alert and oriented X 3. Moves all extremities spontaneously. Psych: Normal affect. HEENT:  Normal  Neck: Supple without bruits or JVD. Lungs:  Resp regular and unlabored, CTA. Heart: RRR no s3, s4, or murmurs. Abdomen: Soft, non-tender,  non-distended, BS + x 4.  Extremities: No clubbing, cyanosis or edema. DP/PT/Radials 2+ and equal bilaterally.  Accessory Clinical Findings  CBC  Recent Labs  03/09/15 0920 03/10/15 0522  WBC 5.2 5.0  HGB 12.7* 11.7*  HCT 37.3* 34.9*  MCV 100.3* 99.1  PLT 134* 137*   Basic Metabolic Panel  Recent Labs  03/09/15 0410  NA 139  K 3.9  CL 105  CO2 22  GLUCOSE 90  BUN 22*  CREATININE 1.46*  CALCIUM 8.5*   Liver Function Tests No results for input(s): AST, ALT, ALKPHOS, BILITOT, PROT, ALBUMIN in the last 72 hours. No results for input(s): LIPASE, AMYLASE in the last 72 hours. Cardiac Enzymes No results for input(s): CKTOTAL, CKMB, CKMBINDEX, TROPONINI in the last 72 hours. BNP Invalid input(s): POCBNP D-Dimer No results for input(s): DDIMER in the last 72 hours. Hemoglobin A1C No results for input(s): HGBA1C in the last 72 hours. Fasting Lipid Panel No results for input(s): CHOL, HDL, LDLCALC, TRIG, CHOLHDL, LDLDIRECT in the last 72 hours. Thyroid Function Tests No results for input(s): TSH, T4TOTAL, T3FREE, THYROIDAB in the last 72 hours.  Invalid input(s):  FREET3  TELE  nsr  Radiology/Studies  Dg Chest Port 1 View  03/05/2015  CLINICAL DATA:  tachcardia EXAM: PORTABLE CHEST - 1 VIEW COMPARISON:  02/12/2014 FINDINGS: Previous CABG. Atheromatous aorta. Heart size normal. Lungs clear. Surgical clips at the thoracic inlet on the left. Multiple monitoring leads overlie the patient. No definite pneumothorax. No effusion. IMPRESSION: 1. No acute disease post CABG. Electronically Signed   By: Corlis Leak M.D.   On: 03/05/2015 16:06    ASSESSMENT AND PLAN  1. VT 2. ICM 3. Chronic systolic chf Rec: patient is on for ICD tomorrow as per Dr. Crissie Sickles. Will write orders. His age is advanced but his quality of life is very good and I think ICD appropriate.  Gregg Taylor,M.D.  03/10/2015 8:40 AM

## 2015-03-10 NOTE — Progress Notes (Signed)
ANTICOAGULATION CONSULT NOTE - Initial Consult  Pharmacy Consult for heparin Indication: chest pain/ACS  Allergies  Allergen Reactions  . Novocain [Procaine Hcl] Other (See Comments)    Cold sweats and very bad headache.   . Metoprolol Hives, Itching and Rash    Patient Measurements: Height: 5\' 6"  (167.6 cm) Weight: 131 lb 8 oz (59.648 kg) IBW/kg (Calculated) : 63.8 Heparin Dosing Weight: 60kg  Vital Signs: Temp: 97.6 F (36.4 C) (10/30 0419) Temp Source: Oral (10/30 0419) BP: 135/79 mmHg (10/30 0419) Pulse Rate: 92 (10/30 0419)  Labs:  Recent Labs  03/09/15 0410 03/09/15 0920 03/09/15 1832 03/10/15 0522  HGB  --  12.7*  --  11.7*  HCT  --  37.3*  --  34.9*  PLT  --  134*  --  137*  HEPARINUNFRC  --  0.30 0.43 0.51  CREATININE 1.46*  --   --   --     Estimated Creatinine Clearance: 30.6 mL/min (by C-G formula based on Cr of 1.46).  Assessment: 34 YOM s/p cath to start on heparin 6h post TR band removal. Patient to have ICD placed.  Sheath removed and TR band placed at 1608 on 10/28. TR band deflated ~1830 and heparin restarted 10/29 at ~ 0130. HL 0.51 therapeutic   No bleeding noted today, Hgb 11.7, plts 137  Goal of Therapy:  Heparin level 0.3-0.7 units/ml Monitor platelets by anticoagulation protocol: Yes   Plan:  -Continue heparin at 800 units/hr -daily HL and CBC -follow ICD placement plans (Monday) and any need to hold heparin  Remi Haggard, PharmD Clinical Pharmacist- Resident Pager: [phone]   03/10/2015 7:39 AM

## 2015-03-11 ENCOUNTER — Encounter (HOSPITAL_COMMUNITY)
Admission: EM | Disposition: A | Discharge status: Home Health | Payer: Self-pay | Source: Home / Self Care | Attending: Cardiology

## 2015-03-11 ENCOUNTER — Encounter (HOSPITAL_COMMUNITY): Payer: Self-pay | Admitting: Cardiology

## 2015-03-11 DIAGNOSIS — I2584 Coronary atherosclerosis due to calcified coronary lesion: Secondary | ICD-10-CM

## 2015-03-11 DIAGNOSIS — I2510 Atherosclerotic heart disease of native coronary artery without angina pectoris: Secondary | ICD-10-CM

## 2015-03-11 LAB — CBC
HCT: 33.2 % — ABNORMAL LOW (ref 39.0–52.0)
Hemoglobin: 11.1 g/dL — ABNORMAL LOW (ref 13.0–17.0)
MCH: 33.2 pg (ref 26.0–34.0)
MCHC: 33.4 g/dL (ref 30.0–36.0)
MCV: 99.4 fL (ref 78.0–100.0)
PLATELETS: 142 10*3/uL — AB (ref 150–400)
RBC: 3.34 MIL/uL — AB (ref 4.22–5.81)
RDW: 12.9 % (ref 11.5–15.5)
WBC: 5.5 10*3/uL (ref 4.0–10.5)

## 2015-03-11 LAB — GLUCOSE, CAPILLARY: GLUCOSE-CAPILLARY: 100 mg/dL — AB (ref 65–99)

## 2015-03-11 LAB — HEPARIN LEVEL (UNFRACTIONATED): HEPARIN UNFRACTIONATED: 0.72 [IU]/mL — AB (ref 0.30–0.70)

## 2015-03-11 SURGERY — ICD IMPLANT

## 2015-03-11 NOTE — Telephone Encounter (Signed)
Pt is currently still in the hospital.  Scheduled for ICD implant today.

## 2015-03-11 NOTE — Progress Notes (Addendum)
SUBJECTIVE: The patient is doing well today.  He denies any kind of chest pain, no shortness of breath, palpitations, no dizziness, near syncope or syncope. He has no new concerns.  [name] amiodarone  200 mg Oral Daily  . amLODipine  5 mg Oral Daily  . aspirin EC  81 mg Oral Daily  . atorvastatin  10 mg Oral QHS  .  ceFAZolin (ANCEF) IV  2 g Intravenous To Cath  . clopidogrel  75 mg Oral Daily  . gentamicin irrigation  80 mg Irrigation To Cath  . isosorbide mononitrate  120 mg Oral Daily  . mexiletine  200 mg Oral Q12H  . pantoprazole  40 mg Oral Daily  . ranolazine  1,000 mg Oral BID  . sodium chloride  3 mL Intravenous Q12H   . sodium chloride 50 mL/hr at 03/11/15 0600  . heparin Stopped (03/11/15 0545)    OBJECTIVE: Physical Exam: Filed Vitals:   03/10/15 0419 03/10/15 1341 03/10/15 2057 03/11/15 0452  BP: 135/79 135/75 118/60 115/58  Pulse: 92 99 98 64  Temp: 97.6 F (36.4 C) 98.2 F (36.8 C) 98.2 F (36.8 C) 98 F (36.7 C)  TempSrc: Oral Oral Oral Oral  Resp:  17    Height:      Weight: 131 lb 8 oz (59.648 kg)   138 lb (62.596 kg)  SpO2: 98% 99% 98% 98%    Intake/Output Summary (Last 24 hours) at 03/11/15 0840 Last data filed at 03/10/15 1802  Gross per 24 hour  Intake    240 ml  Output      0 ml  Net    240 ml    Telemetry reveals sinus rhythm/sinus bradycardia, 1st degree AVBlock, 50's-80's, no VT observed in the last 24 hours  GEN- The patient is thin, well appearing, alert and oriented x 3 today.   Head- normocephalic, atraumatic Eyes-  Sclera clear, conjunctiva pink Ears- hearing intact Neck- supple, no JVP Lungs- Clear to ausculation bilaterally, normal work of breathing Heart- Regular rate and rhythm, no significant murmurs, no rubs or gallops GI- soft, NT, ND, + BS Extremities- no clubbing, cyanosis, or edema Skin- no rash or lesion Psych- euthymic mood, full affect Neuro- no gross deficits appreciated  LABS: Basic Metabolic Panel:  Recent  Labs  03/09/15 0410  NA 139  K 3.9  CL 105  CO2 22  GLUCOSE 90  BUN 22*  CREATININE 1.46*  CALCIUM 8.5*   CBC:  Recent Labs  03/10/15 0522 03/11/15 0326  WBC 5.0 5.5  HGB 11.7* 11.1*  HCT 34.9* 33.2*  MCV 99.1 99.4  PLT 137* 142*   03/08/15: Cardiac cath Impression:  Known severe native coronary disease. Disabled contrast the native arteries were not evaluated.  Occluded old SVG-OM1 - the dye remained standing in the proximal vessel for several minutes following angiography - not PCI amenable  This is the likely culprit for the patient's episode of what sounds like anginal symptoms about a month ago. This may also be a cause of now worsening ventricular tachycardia.   Widely patent Y graft with one limb providing sequential grafting to OM1 and OM 2 as well as a separate limb serving as a graft to the LAD which retrograde fills the diagonal branch. This redo graft now provides the entirety of the patient's coronary circulation.  The graft obtuse marginal branches are relatively free of disease with retrograde filling back to the distal circumflex system which consists of the posterolateral branch and  left PDA.  The grafted LAD itself was a very diffusely diseased vessel, but no change from previous.  Known small nondominant right coronary artery system.  Borderline low LVEDP Recommendations:  No PCI targets.  Standard post radial cath care with TR band removal  Would proceed with ICD placement per EP  03/06/15: Echocardiogram Study Conclusions - Left ventricle: The cavity size was normal. Systolic function was severely reduced. The estimated ejection fraction was in the range of 20% to 25%. Diffuse hypokinesis. There is akinesis of the mid-apicalanteroseptal and apical myocardium. Doppler parameters are consistent with abnormal left ventricular relaxation (grade 1 diastolic dysfunction). - Mitral valve: Calcified annulus. There was mild  regurgitation. Impressions: - When compared to prior echocardiogram, EF is reduced (prior 35%).  RADIOLOGY: Dg Chest Port 1 View 03/05/2015  CLINICAL DATA:  tachcardia EXAM: PORTABLE CHEST - 1 VIEW COMPARISON:  02/12/2014 FINDINGS: Previous CABG. Atheromatous aorta. Heart size normal. Lungs clear. Surgical clips at the thoracic inlet on the left. Multiple monitoring leads overlie the patient. No definite pneumothorax. No effusion. IMPRESSION: 1. No acute disease post CABG. Electronically Signed   By: Corlis Leak M.D.   On: 03/05/2015 16:06    ASSESSMENT AND PLAN:   Principal Problem:   Ventricular tachycardia (HCC)   None noted in the last 24 hours on PO amiodarone and Mexiletine   SB 50's  Active Problems:   Crohn's disease (HCC)   CAD (coronary artery disease)      No CP, on ASA/Plavix   HTN (hypertension)  Appears controlled   PVD (peripheral vascular disease) (HCC)   Carotid stenosis   Ischemic cardiomyopathy  Appears euvolemic this morning  Not on BB with bradycardia, no ACE/ARB with CKD   Chronic systolic heart failure (HCC)   RBBB   CKD (chronic kidney disease), stage III     Francis Dowse, PA-C 03/11/2015 8:40 AM   The patient has had no ventricular tachycardia over the last 72 hours on the combinations of mexiletine ranolazine and low-dose amiodarone. Given the paucity of symptoms with his recurrent events, it is unlikely that an ICD will have a  significant  impact mortality hence, the goal of therapy is reduction of symptoms. I have reviewed this with Dr. Excell Seltzer. The plan will be to watch him for another 24-48 hours and if he is without recurrent ventricular tachycardia discharge on his current medications. He is tolerating his low-dose amiodarone surprisingly as it relates to bradycardia.  We will stop heparin and use SCDsd

## 2015-03-11 NOTE — Progress Notes (Signed)
Called into patients room,Patient was sitting in bedside chair  stated he felt nauseous, Pt then became sweaty and pale, Heart rate dropped to 33, patient not responding to verbal commands, put O2 on pt, had 2 RN's help get patient back to bed , did BP 143/72, HR now 68, PA paged and at bedside, Patient stated he was feeling better, HR staying in the 60's. PA spoke to Dr. Marikay Alar, soon to come to bedside to speak with both patient and family. Will continue to monitor.

## 2015-03-11 NOTE — Progress Notes (Signed)
Recalled to see the patient who became weak, diaphoretic, naseous, and bradycardic with rates about 30bpm. He was seated at the time, denied any CP or palpitations.  He was helped to bed, his BP was 135/67 with HR recovering into 70's once in bed and after a few minutes feeling better, his BS was checked and 100.  Telemetry was reviewed with Dr. Graciela Husbands, gradual onset and recovery, with some SA/AV node disfunction at his slowest rates, felt likely was a vagal episode, though we will stop his amiodarone,  To note, family present, (wife and grandson) report him to be very sedentary, basically sitting most of the day in his chair, with poor dietary intake as well.  May benefit from PT.  The patient's wife mentions he still drives, he was instructed he can not drive for 57mo, by [place] law given his VT.

## 2015-03-12 NOTE — Telephone Encounter (Signed)
Still in hospital.  Will call when we get discharge notice and fu appointment.

## 2015-03-12 NOTE — Care Management Note (Signed)
Case Management Note  Patient Details  Name: Karl Stevenson MRN: [id number] Date of Birth: December 26, 1926  Subjective/Objective:  Pt admitted for . Recurrent Episodes of NSVT. Pt is from home with wife. Plan is to return home once stable.                   Action/Plan: Pt is agreeable to [facility] and PT services with [facility]. CM did make referral with [facility] and SOC to begin within 24-48 hrs post d/c. Pt will need order for RW as well.    Expected Discharge Date:                  Expected Discharge Plan:  Home w Home Health Services  In-House Referral:     Discharge planning Services  CM Consult  Post Acute Care Choice:  Durable Medical Equipment, Home Health Choice offered to:  Patient, Spouse  DME Arranged:  Dan Humphreys DME Agency:  [facility]  HH Arranged:  RN, PT [facility] Agency:  [facility]  Status of Service:  Completed, signed off  Medicare Important Message Given:  Yes-second notification given Date Medicare IM Given:    Medicare IM give by:    Date Additional Medicare IM Given:    Additional Medicare Important Message give by:     If discussed at Long Length of Stay Meetings, dates discussed:    Additional Comments:  Gala Lewandowsky, RN 03/12/2015, 3:01 PM

## 2015-03-12 NOTE — Progress Notes (Signed)
SUBJECTIVE: The patient is doing well today.  At this time, he denies chest pain, shortness of breath, or any new concerns.  No VT in last 48 hours No additional syncope or vagal episodes.  CURRENT MEDICATIONS: . amLODipine  5 mg Oral Daily  . aspirin EC  81 mg Oral Daily  . atorvastatin  10 mg Oral QHS  .  ceFAZolin (ANCEF) IV  2 g Intravenous To Cath  . clopidogrel  75 mg Oral Daily  . gentamicin irrigation  80 mg Irrigation To Cath  . isosorbide mononitrate  120 mg Oral Daily  . mexiletine  200 mg Oral Q12H  . pantoprazole  40 mg Oral Daily  . ranolazine  1,000 mg Oral BID  . sodium chloride  3 mL Intravenous Q12H   . sodium chloride 50 mL/hr at 03/11/15 0600    OBJECTIVE: Physical Exam: Filed Vitals:   03/12/15 0100 03/12/15 0300 03/12/15 0535 03/12/15 0540  BP: 92/45 99/58 118/67   Pulse: 53 58 66   Temp:    98 F (36.7 C)  TempSrc:    Oral  Resp: 13 16 13    Height:      Weight:    130 lb 12.8 oz (59.33 kg)  SpO2: 100% 100% 100%     Intake/Output Summary (Last 24 hours) at 03/12/15 0957 Last data filed at 03/12/15 8887  Gross per 24 hour  Intake   1260 ml  Output    965 ml  Net    295 ml    Telemetry reveals sinus rhythm/sinus bradycardia   GEN- The patient is elderly appearing, alert and oriented x 3 today.   Head- normocephalic, atraumatic Eyes-  Sclera clear, conjunctiva pink Ears- hearing intact Oropharynx- clear Neck- supple  Lungs- Clear to ausculation bilaterally, normal work of breathing Heart- Regular rate and rhythm, no murmurs, rubs or gallops  GI- soft, NT, ND, + BS Extremities- no clubbing, cyanosis, or edema Skin- no rash or lesion Psych- euthymic mood, full affect Neuro- strength and sensation are intact  LABS: CBC:  Recent Labs  03/10/15 0522 03/11/15 0326  WBC 5.0 5.5  HGB 11.7* 11.1*  HCT 34.9* 33.2*  MCV 99.1 99.4  PLT 137* 142*    RADIOLOGY: Dg Chest Port 1 View 03/05/2015  CLINICAL DATA:  tachcardia EXAM:  PORTABLE CHEST - 1 VIEW COMPARISON:  02/12/2014 FINDINGS: Previous CABG. Atheromatous aorta. Heart size normal. Lungs clear. Surgical clips at the thoracic inlet on the left. Multiple monitoring leads overlie the patient. No definite pneumothorax. No effusion. IMPRESSION: 1. No acute disease post CABG. Electronically Signed   By: Corlis Leak M.D.   On: 03/05/2015 16:06    ASSESSMENT AND PLAN:  Principal Problem:   Ventricular tachycardia (HCC) Active Problems:   Crohn's disease (HCC)   CAD (coronary artery disease)   HTN (hypertension)   PVD (peripheral vascular disease) (HCC)   Carotid stenosis   Ischemic cardiomyopathy   Chronic systolic heart failure (HCC)   RBBB   CKD (chronic kidney disease), stage III   NSVT (nonsustained ventricular tachycardia) (HCC)    1.  Sustained, hemodynamically stable VT The patient has had tachypalpitations not associated with dizziness, pre-syncope, or syncope for several years.   EKG's this admission have demonstrated 2 different VT morphologies Continue Mexelitine and Ranexa Amiodarone discontinued 2/2 bradycardia during vagal event With relatively asymptomatic VT, advanced age, and controlled arrhythmias on AAD therapy, ICD implantation is not recommended at this time.   2.  CAD/ICM  No recent ischemic symptoms Cath stable this admission  3.  Conduction system disease/sinus bradycardia The patient has underlying conduction system disease with 1st degree AV block and RBBB He has not had any symptoms of dizziness, pre-syncope, or syncope. Avoid AVN blocking agents  4.  CKD BMET in AM  Anticipate DC tomorrow if no recurrent VT today or overnight tonight.  PT recommending HHPT   Gypsy Balsam, NP 03/12/2015 9:57 AM   EP Attending  Patient seen and examined. CV - RRR, Lungs - clear, ext- no edema. Agree with the findings as documented above with minimal modification. The patient has stabilized. No further vagal episodes. Ok for discharge home  tomorrow if stable. Note amio was stopped.   Karl Stevenson.D.

## 2015-03-12 NOTE — Evaluation (Signed)
Physical Therapy Evaluation Patient Details Name: Karl Stevenson MRN: [id number] DOB: 1926/10/06 Today's Date: 03/12/2015   History of Present Illness  pt presents with Ventricular Tachycardia and Bradycardia.  pt with hx of Crohn's Disease, HTN, CAD, and PVD.    Clinical Impression  Pt generally unsteady with all mobility, but not receptive to discussion about needing AD or additional A at home.  Concern about pt's fall risk and per RN wife is not in good health either.  Pt would benefit from 24hr S and A with homemaking tasks.  Will need to try to maximize [facility] Services if able.  Will continue to follow while on acute.      Follow Up Recommendations Home health PT;Supervision/Assistance - 24 hour (HHAide and RN)    Equipment Recommendations  Rolling walker with 5" wheels    Recommendations for Other Services       Precautions / Restrictions Precautions Precautions: Fall Restrictions Weight Bearing Restrictions: No      Mobility  Bed Mobility Overal bed mobility: Modified Independent             General bed mobility comments: pt needs increased time, but able to complete without A.    Transfers Overall transfer level: Needs assistance Equipment used: None Transfers: Sit to/from Stand Sit to Stand: Min guard         General transfer comment: pt mildly unsteady and with definite use of UEs for support.    Ambulation/Gait Ambulation/Gait assistance: Min assist Ambulation Distance (Feet): 120 Feet Assistive device: None Gait Pattern/deviations: Step-through pattern;Decreased stride length;Drifts right/left     General Gait Details: pt generally usnteady and drifts to either side during ambulation.  pt seems unaware of these LOB and occasional need for A.  Discussed use of RW for safety, but pt not receptive.    Stairs            Wheelchair Mobility    Modified Rankin (Stroke Patients Only)       Balance Overall balance assessment: Needs  assistance Sitting-balance support: No upper extremity supported;Feet supported Sitting balance-Leahy Scale: Good     Standing balance support: No upper extremity supported;During functional activity Standing balance-Leahy Scale: Fair                               Pertinent Vitals/Pain Pain Assessment: No/denies pain    Home Living Family/patient expects to be discharged to:: Private residence Living Arrangements: Spouse/significant other Available Help at Discharge: Family;Available 24 hours/day (Wife 24hrs, but unclear how much additional family can A ) Type of Home: House Home Access: Stairs to enter Entrance Stairs-Rails:  (pt indicates he and wife hold the wall.) Entrance Stairs-Number of Steps: 2 Home Layout: One level Home Equipment: Cane - single point      Prior Function Level of Independence: Independent         Comments: Per pt he is independent, but per RN grandchildren have expressed concerns about pt and wife's abilities at home.       Hand Dominance        Extremity/Trunk Assessment   Upper Extremity Assessment: Generalized weakness           Lower Extremity Assessment: Generalized weakness      Cervical / Trunk Assessment: Kyphotic  Communication   Communication: No difficulties  Cognition Arousal/Alertness: Awake/alert Behavior During Therapy: WFL for tasks assessed/performed Overall Cognitive Status: No family/caregiver present to determine baseline cognitive functioning (  Seems absent minded, but unclear if this is baseline.  )                      General Comments      Exercises        Assessment/Plan    PT Assessment Patient needs continued PT services  PT Diagnosis Difficulty walking;Generalized weakness   PT Problem List Decreased strength;Decreased activity tolerance;Decreased balance;Decreased mobility;Decreased coordination;Decreased cognition;Decreased knowledge of use of DME;Decreased safety  awareness;Cardiopulmonary status limiting activity  PT Treatment Interventions DME instruction;Gait training;Stair training;Functional mobility training;Therapeutic activities;Therapeutic exercise;Balance training;Cognitive remediation;Patient/family education   PT Goals (Current goals can be found in the Care Plan section) Acute Rehab PT Goals Patient Stated Goal: Home ASAP PT Goal Formulation: With patient Time For Goal Achievement: 03/19/15 Potential to Achieve Goals: Good    Frequency Min 3X/week   Barriers to discharge Decreased caregiver support Unclear amount of caregiver support other than frail wife.      Co-evaluation               End of Session Equipment Utilized During Treatment: Gait belt Activity Tolerance: Patient tolerated treatment well Patient left: in chair;with call bell/phone within reach;with chair alarm set Nurse Communication: Mobility status         Time: 1751-0258 PT Time Calculation (min) (ACUTE ONLY): 25 min   Charges:   PT Evaluation $Initial PT Evaluation Tier I: 1 Procedure PT Treatments $Gait Training: 8-22 mins   PT G Codes:        Sheyna Pettibone F 03/12/2015, 9:40 AM

## 2015-03-13 ENCOUNTER — Encounter: Payer: Medicare PPO | Admitting: Nurse Practitioner

## 2015-03-13 LAB — BASIC METABOLIC PANEL
Anion gap: 10 (ref 5–15)
BUN: 23 mg/dL — AB (ref 6–20)
CALCIUM: 9.2 mg/dL (ref 8.9–10.3)
CO2: 26 mmol/L (ref 22–32)
CREATININE: 1.41 mg/dL — AB (ref 0.61–1.24)
Chloride: 101 mmol/L (ref 101–111)
GFR calc Af Amer: 50 mL/min — ABNORMAL LOW (ref >60)
GFR, EST NON AFRICAN AMERICAN: 44 mL/min — AB (ref >60)
GLUCOSE: 103 mg/dL — AB (ref 65–99)
Potassium: 3.8 mmol/L (ref 3.5–5.1)
Sodium: 137 mmol/L (ref 135–145)

## 2015-03-13 MED ORDER — RANOLAZINE ER 1000 MG PO TB12: 1000.0000 mg | ORAL_TABLET | Freq: Two times a day (BID) | ORAL | Status: DC

## 2015-03-13 MED ORDER — AMLODIPINE BESYLATE 2.5 MG PO TABS
2.5000 mg | ORAL_TABLET | Freq: Every day | ORAL | Status: DC
  Administered 2015-03-13: 2.5 mg via ORAL
  Filled 2015-03-13: qty 1

## 2015-03-13 MED ORDER — AMLODIPINE BESYLATE 2.5 MG PO TABS: 2.5000 mg | ORAL_TABLET | Freq: Every day | ORAL | Status: DC

## 2015-03-13 MED ORDER — MEXILETINE HCL 200 MG PO CAPS: 200.0000 mg | ORAL_CAPSULE | Freq: Two times a day (BID) | ORAL | Status: DC

## 2015-03-13 NOTE — Discharge Instructions (Signed)
**  PLEASE REMEMBER TO BRING ALL OF YOUR MEDICATIONS TO EACH OF YOUR FOLLOW-UP OFFICE VISITS.  NO DRIVING FOR 

## 2015-03-13 NOTE — Progress Notes (Signed)
    SUBJECTIVE: The patient is doing well today.  At this time, he denies chest pain, shortness of breath, or any new concerns.  No VT in last 48 hours No additional syncope or vagal episodes.  CURRENT MEDICATIONS: . amLODipine  5 mg Oral Daily  . aspirin EC  81 mg Oral Daily  . atorvastatin  10 mg Oral QHS  . clopidogrel  75 mg Oral Daily  . isosorbide mononitrate  120 mg Oral Daily  . mexiletine  200 mg Oral Q12H  . pantoprazole  40 mg Oral Daily  . ranolazine  1,000 mg Oral BID  . sodium chloride  3 mL Intravenous Q12H   . sodium chloride 50 mL/hr at 03/11/15 0600    OBJECTIVE: Physical Exam: Filed Vitals:   03/12/15 1020 03/12/15 1325 03/12/15 2027 03/13/15 0508  BP: 112/73 111/55 103/62 106/72  Pulse:  67 62 68  Temp:  97.8 F (36.6 C) 98 F (36.7 C) 98 F (36.7 C)  TempSrc:  Oral Oral Oral  Resp:  Height:      Weight:    129 lb 12.8 oz (58.877 kg)  SpO2:  97% 97% 99%    Intake/Output Summary (Last 24 hours) at 03/13/15 0749 Last data filed at 03/13/15 0500  Gross per 24 hour  Intake    843 ml  Output    451 ml  Net    392 ml    Telemetry reveals sinus rhythm/sinus bradycardia   GEN- The patient is elderly appearing, alert and oriented x 3 today.   Head- normocephalic, atraumatic Eyes-  Sclera clear, conjunctiva pink Ears- hearing intact Oropharynx- clear Neck- supple  Lungs- Clear to ausculation bilaterally, normal work of breathing Heart- Regular rate and rhythm, no murmurs, rubs or gallops  GI- soft, NT, ND, + BS Extremities- no clubbing, cyanosis, or edema Skin- no rash or lesion Psych- euthymic mood, full affect Neuro- strength and sensation are intact  LABS: CBC:  Recent Labs  03/11/15 0326  WBC 5.5  HGB 11.1*  HCT 33.2*  MCV 99.4  PLT 142*    RADIOLOGY: Dg Chest Port 1 View 03/05/2015  CLINICAL DATA:  tachcardia EXAM: PORTABLE CHEST - 1 VIEW COMPARISON:  02/12/2014 FINDINGS: Previous CABG. Atheromatous aorta. Heart size  normal. Lungs clear. Surgical clips at the thoracic inlet on the left. Multiple monitoring leads overlie the patient. No definite pneumothorax. No effusion. IMPRESSION: 1. No acute disease post CABG. Electronically Signed   By: Corlis Leak M.D.   On: 03/05/2015 16:06    ASSESSMENT AND PLAN:  Principal Problem:   Ventricular tachycardia (HCC) Active Problems:   Crohn's disease (HCC)   CAD (coronary artery disease)   HTN (hypertension)   PVD (peripheral vascular disease) (HCC)   Carotid stenosis   Ischemic cardiomyopathy   Chronic systolic heart failure (HCC)   RBBB   CKD (chronic kidney disease), stage III   NSVT (nonsustained ventricular tachycardia) (HCC)    1.  Sustained, hemodynamically stable VT    2.  CAD/ICM   3.  Conduction system disease/sinus bradycardia   4.  CKD    On mex and ranexa for VT Decrease amldopiine for borderline BP No recurrent VT or bradycardia    For discaareg today F/u EP 2 weeks

## 2015-03-13 NOTE — Discharge Summary (Signed)
ELECTROPHYSIOLOGY  DISCHARGE SUMMARY    Patient ID: Karl Stevenson,  MRN: [id number], DOB/AGE: 1926/09/12 81 y.o.  Admit date: 03/05/2015 Discharge date: 03/13/2015  Primary Care Physician: Garlan Fillers, MD Primary Cardiologist: Dr. Excell Seltzer Electrophysiologist: Dr. Graciela Husbands  Primary Discharge Diagnosis:  Ischemic cardiomyopathy, VT  Secondary Discharge Diagnosis:  CAD, stable without symptoms PVD, stable without symptoms Hyperlipidemia CKD III   Allergies  Allergen Reactions  . Novocain [Procaine Hcl] Other (See Comments)    Cold sweats and very bad headache.   . Metoprolol Hives, Itching and Rash    Brief HPI: The patient was admitted to [facility] after he was found to have VT on an outpatient event monitor.  Hospital Course: TAYMAR NIVAR is a 81 y.o. male admitted to [facility] 03/05/15 with PMHx of CAD (s/p CABG in 1987, re-do in 2008, last cath 2014 with patent SVG-OM1/OM2, SVG-LAD, and SVG-OM3), ischemic DCM (EF 35-40% by echo in 2012), PVD (s/p left CEA 2010), AAA, renal artery stenosis, moderate left subclavian stenosis, chronic systolic CHF, HTN, HLD, COPD, and intermittent bradycardia and tachycardia with bifasicular block who presents to the [facility] ED on 03/05/2015 for tachycardia. He has some years of undiagnosed palpitations until recently on an event monitor he was noted to have 36 second run of VT and referred to the hospital. He had observed VT here in the hospital treated with IV then PO amiodarone, after much discussion regarding device implant verses medical therapy, he was placed on Mexiletine and Ranexa with supression of his VT.  The patient with his VT felt palpitations, some SOB, no near syncope or syncope though, no CP. He did develop a brief symptomatic bradycardic spell, felt likely to be more of a vasovagal event, though his amiodarone was stopped at that point.  His HR and rhythm have remained stable since then and no device implant is being recommended  at this time.  His renal function appears stable today.  The patient has been ambulating, and is feeling well without palpitations, his telemetry has been reviewed today with Dr. Graciela Husbands who as seen and examined the patient and felt to be stable for discharge today.  Avoid nodal blocking agents for BP control given conduction disease and bradycardia noted during his stay.  THE PATIENT HAS BEEN INSTRUCTED NO DRIVING FOR .   Physical Exam: Filed Vitals:   03/12/15 1020 03/12/15 1325 03/12/15 2027 03/13/15 0508  BP: 112/73 111/55 103/62 106/72  Pulse:  67 62 68  Temp:  97.8 F (36.6 C) 98 F (36.7 C) 98 F (36.7 C)  TempSrc:  Oral Oral Oral  Resp:  17 18 18   Height:      Weight:    129 lb 12.8 oz (58.877 kg)  SpO2:  97% 97% 99%     Labs:   Lab Results  Component Value Date   WBC 5.5 03/11/2015   HGB 11.1* 03/11/2015   HCT 33.2* 03/11/2015   MCV 99.4 03/11/2015   PLT 142* 03/11/2015     Recent Labs Lab 03/13/15 0324  NA 137  K 3.8  CL 101  CO2 26  BUN 23*  CREATININE 1.41*  CALCIUM 9.2  GLUCOSE 103*     Discharge Medications:    Medication List    STOP taking these medications        losartan 100 MG tablet  Commonly known as:  COZAAR     propranolol 10 MG tablet  Commonly known as:  INDERAL  TAKE these medications        acetaminophen 500 MG tablet  Commonly known as:  TYLENOL  Take 1,000 mg by mouth every 6 (six) hours as needed for pain.     amLODipine 2.5 MG tablet  Commonly known as:  NORVASC  Take 1 tablet (2.5 mg total) by mouth daily.     aspirin 81 MG tablet  Take 81 mg by mouth daily.     atorvastatin 10 MG tablet  Commonly known as:  LIPITOR  Take 10 mg by mouth at bedtime.     clopidogrel 75 MG tablet  Commonly known as:  PLAVIX  Take 75 mg by mouth daily.     COLCRYS 0.6 MG tablet  Generic drug:  colchicine  Take 0.6 mg by mouth daily as needed (gout).     ipratropium 0.03 % nasal spray  Commonly known as:  ATROVENT    Place 1 spray into the nose daily as needed.     isosorbide mononitrate 120 MG 24 hr tablet  Commonly known as:  IMDUR  TAKE ONE TABLET BY MOUTH ONCE DAILY.     mexiletine 200 MG capsule  Commonly known as:  MEXITIL  Take 1 capsule (200 mg total) by mouth every 12 (twelve) hours.     MULTIVITAMIN PO  Take 2 tablets by mouth daily.     nitroGLYCERIN 0.4 MG SL tablet  Commonly known as:  NITROSTAT  Place 0.4 mg under the tongue every 5 (five) minutes as needed for chest pain.     OVER THE COUNTER MEDICATION  1 scoop. Barley life otc medication by aim.     pantoprazole 40 MG tablet  Commonly known as:  PROTONIX  TAKE ONE TABLET BY MOUTH ONCE DAILY     ranolazine 1000 MG SR tablet  Commonly known as:  RANEXA  Take 1 tablet (1,000 mg total) by mouth 2 (two) times daily.        Disposition:  Discharge Instructions    Diet - low sodium heart healthy    Complete by:  As directed      Increase activity slowly    Complete by:  As directed           Follow-up Information    Follow up with Sheilah Pigeon, PA-C On 04/02/2015.   Specialty:  Cardiology   Why:  11:00AM   Contact information:   [address] [place] [address] [phone]       Follow up with Norma Fredrickson, NP On 04/16/2015.   Specialties:  Nurse Practitioner, Interventional Cardiology, Cardiology, Radiology   Why:  11:00AM   Contact information:   [address] [place] [address] [phone]       Duration of Discharge Encounter: Greater than 30 minutes including physician time.  Norma Fredrickson, PA-C 03/13/2015 10:27 AM

## 2015-03-14 DIAGNOSIS — I255 Ischemic cardiomyopathy: Secondary | ICD-10-CM | POA: Diagnosis not present

## 2015-03-14 DIAGNOSIS — I10 Essential (primary) hypertension: Secondary | ICD-10-CM | POA: Diagnosis not present

## 2015-03-14 DIAGNOSIS — E785 Hyperlipidemia, unspecified: Secondary | ICD-10-CM | POA: Diagnosis not present

## 2015-03-14 DIAGNOSIS — I5022 Chronic systolic (congestive) heart failure: Secondary | ICD-10-CM | POA: Diagnosis not present

## 2015-03-14 DIAGNOSIS — K5090 Crohn's disease, unspecified, without complications: Secondary | ICD-10-CM | POA: Diagnosis not present

## 2015-03-14 DIAGNOSIS — I739 Peripheral vascular disease, unspecified: Secondary | ICD-10-CM | POA: Diagnosis not present

## 2015-03-14 DIAGNOSIS — I472 Ventricular tachycardia: Secondary | ICD-10-CM | POA: Diagnosis not present

## 2015-03-14 DIAGNOSIS — I2510 Atherosclerotic heart disease of native coronary artery without angina pectoris: Secondary | ICD-10-CM | POA: Diagnosis not present

## 2015-03-14 DIAGNOSIS — J449 Chronic obstructive pulmonary disease, unspecified: Secondary | ICD-10-CM | POA: Diagnosis not present

## 2015-03-18 ENCOUNTER — Encounter: Payer: Medicare PPO | Admitting: Physician Assistant

## 2015-03-18 DIAGNOSIS — I472 Ventricular tachycardia: Secondary | ICD-10-CM | POA: Diagnosis not present

## 2015-03-18 DIAGNOSIS — I10 Essential (primary) hypertension: Secondary | ICD-10-CM | POA: Diagnosis not present

## 2015-03-18 DIAGNOSIS — I2510 Atherosclerotic heart disease of native coronary artery without angina pectoris: Secondary | ICD-10-CM | POA: Diagnosis not present

## 2015-03-18 DIAGNOSIS — I739 Peripheral vascular disease, unspecified: Secondary | ICD-10-CM | POA: Diagnosis not present

## 2015-03-18 DIAGNOSIS — I5022 Chronic systolic (congestive) heart failure: Secondary | ICD-10-CM | POA: Diagnosis not present

## 2015-03-18 DIAGNOSIS — K5090 Crohn's disease, unspecified, without complications: Secondary | ICD-10-CM | POA: Diagnosis not present

## 2015-03-18 DIAGNOSIS — J449 Chronic obstructive pulmonary disease, unspecified: Secondary | ICD-10-CM | POA: Diagnosis not present

## 2015-03-18 DIAGNOSIS — E785 Hyperlipidemia, unspecified: Secondary | ICD-10-CM | POA: Diagnosis not present

## 2015-03-18 DIAGNOSIS — I255 Ischemic cardiomyopathy: Secondary | ICD-10-CM | POA: Diagnosis not present

## 2015-03-19 DIAGNOSIS — E785 Hyperlipidemia, unspecified: Secondary | ICD-10-CM | POA: Diagnosis not present

## 2015-03-19 DIAGNOSIS — I10 Essential (primary) hypertension: Secondary | ICD-10-CM | POA: Diagnosis not present

## 2015-03-19 DIAGNOSIS — I5022 Chronic systolic (congestive) heart failure: Secondary | ICD-10-CM | POA: Diagnosis not present

## 2015-03-19 DIAGNOSIS — I255 Ischemic cardiomyopathy: Secondary | ICD-10-CM | POA: Diagnosis not present

## 2015-03-19 DIAGNOSIS — J449 Chronic obstructive pulmonary disease, unspecified: Secondary | ICD-10-CM | POA: Diagnosis not present

## 2015-03-19 DIAGNOSIS — I739 Peripheral vascular disease, unspecified: Secondary | ICD-10-CM | POA: Diagnosis not present

## 2015-03-19 DIAGNOSIS — K5090 Crohn's disease, unspecified, without complications: Secondary | ICD-10-CM | POA: Diagnosis not present

## 2015-03-19 DIAGNOSIS — I2510 Atherosclerotic heart disease of native coronary artery without angina pectoris: Secondary | ICD-10-CM | POA: Diagnosis not present

## 2015-03-19 DIAGNOSIS — I472 Ventricular tachycardia: Secondary | ICD-10-CM | POA: Diagnosis not present

## 2015-03-20 DIAGNOSIS — J449 Chronic obstructive pulmonary disease, unspecified: Secondary | ICD-10-CM | POA: Diagnosis not present

## 2015-03-20 DIAGNOSIS — I2510 Atherosclerotic heart disease of native coronary artery without angina pectoris: Secondary | ICD-10-CM | POA: Diagnosis not present

## 2015-03-20 DIAGNOSIS — I472 Ventricular tachycardia: Secondary | ICD-10-CM | POA: Diagnosis not present

## 2015-03-20 DIAGNOSIS — I5022 Chronic systolic (congestive) heart failure: Secondary | ICD-10-CM | POA: Diagnosis not present

## 2015-03-20 DIAGNOSIS — I255 Ischemic cardiomyopathy: Secondary | ICD-10-CM | POA: Diagnosis not present

## 2015-03-20 DIAGNOSIS — I739 Peripheral vascular disease, unspecified: Secondary | ICD-10-CM | POA: Diagnosis not present

## 2015-03-20 DIAGNOSIS — K5090 Crohn's disease, unspecified, without complications: Secondary | ICD-10-CM | POA: Diagnosis not present

## 2015-03-20 DIAGNOSIS — I10 Essential (primary) hypertension: Secondary | ICD-10-CM | POA: Diagnosis not present

## 2015-03-20 DIAGNOSIS — E785 Hyperlipidemia, unspecified: Secondary | ICD-10-CM | POA: Diagnosis not present

## 2015-03-21 DIAGNOSIS — I255 Ischemic cardiomyopathy: Secondary | ICD-10-CM | POA: Diagnosis not present

## 2015-03-21 DIAGNOSIS — K5090 Crohn's disease, unspecified, without complications: Secondary | ICD-10-CM | POA: Diagnosis not present

## 2015-03-21 DIAGNOSIS — I10 Essential (primary) hypertension: Secondary | ICD-10-CM | POA: Diagnosis not present

## 2015-03-21 DIAGNOSIS — I739 Peripheral vascular disease, unspecified: Secondary | ICD-10-CM | POA: Diagnosis not present

## 2015-03-21 DIAGNOSIS — J449 Chronic obstructive pulmonary disease, unspecified: Secondary | ICD-10-CM | POA: Diagnosis not present

## 2015-03-21 DIAGNOSIS — E785 Hyperlipidemia, unspecified: Secondary | ICD-10-CM | POA: Diagnosis not present

## 2015-03-21 DIAGNOSIS — I5022 Chronic systolic (congestive) heart failure: Secondary | ICD-10-CM | POA: Diagnosis not present

## 2015-03-21 DIAGNOSIS — I2510 Atherosclerotic heart disease of native coronary artery without angina pectoris: Secondary | ICD-10-CM | POA: Diagnosis not present

## 2015-03-21 DIAGNOSIS — I472 Ventricular tachycardia: Secondary | ICD-10-CM | POA: Diagnosis not present

## 2015-03-26 DIAGNOSIS — K5090 Crohn's disease, unspecified, without complications: Secondary | ICD-10-CM | POA: Diagnosis not present

## 2015-03-26 DIAGNOSIS — I739 Peripheral vascular disease, unspecified: Secondary | ICD-10-CM | POA: Diagnosis not present

## 2015-03-26 DIAGNOSIS — I10 Essential (primary) hypertension: Secondary | ICD-10-CM | POA: Diagnosis not present

## 2015-03-26 DIAGNOSIS — I472 Ventricular tachycardia: Secondary | ICD-10-CM | POA: Diagnosis not present

## 2015-03-26 DIAGNOSIS — J449 Chronic obstructive pulmonary disease, unspecified: Secondary | ICD-10-CM | POA: Diagnosis not present

## 2015-03-26 DIAGNOSIS — I255 Ischemic cardiomyopathy: Secondary | ICD-10-CM | POA: Diagnosis not present

## 2015-03-26 DIAGNOSIS — I2510 Atherosclerotic heart disease of native coronary artery without angina pectoris: Secondary | ICD-10-CM | POA: Diagnosis not present

## 2015-03-26 DIAGNOSIS — E785 Hyperlipidemia, unspecified: Secondary | ICD-10-CM | POA: Diagnosis not present

## 2015-03-26 DIAGNOSIS — I5022 Chronic systolic (congestive) heart failure: Secondary | ICD-10-CM | POA: Diagnosis not present

## 2015-03-28 DIAGNOSIS — E785 Hyperlipidemia, unspecified: Secondary | ICD-10-CM | POA: Diagnosis not present

## 2015-03-28 DIAGNOSIS — I5022 Chronic systolic (congestive) heart failure: Secondary | ICD-10-CM | POA: Diagnosis not present

## 2015-03-28 DIAGNOSIS — I739 Peripheral vascular disease, unspecified: Secondary | ICD-10-CM | POA: Diagnosis not present

## 2015-03-28 DIAGNOSIS — I10 Essential (primary) hypertension: Secondary | ICD-10-CM | POA: Diagnosis not present

## 2015-03-28 DIAGNOSIS — K5090 Crohn's disease, unspecified, without complications: Secondary | ICD-10-CM | POA: Diagnosis not present

## 2015-03-28 DIAGNOSIS — J449 Chronic obstructive pulmonary disease, unspecified: Secondary | ICD-10-CM | POA: Diagnosis not present

## 2015-03-28 DIAGNOSIS — I472 Ventricular tachycardia: Secondary | ICD-10-CM | POA: Diagnosis not present

## 2015-03-28 DIAGNOSIS — I2510 Atherosclerotic heart disease of native coronary artery without angina pectoris: Secondary | ICD-10-CM | POA: Diagnosis not present

## 2015-03-28 DIAGNOSIS — I255 Ischemic cardiomyopathy: Secondary | ICD-10-CM | POA: Diagnosis not present

## 2015-03-31 DIAGNOSIS — E785 Hyperlipidemia, unspecified: Secondary | ICD-10-CM | POA: Diagnosis not present

## 2015-03-31 DIAGNOSIS — K5090 Crohn's disease, unspecified, without complications: Secondary | ICD-10-CM | POA: Diagnosis not present

## 2015-03-31 DIAGNOSIS — I5022 Chronic systolic (congestive) heart failure: Secondary | ICD-10-CM | POA: Diagnosis not present

## 2015-03-31 DIAGNOSIS — I10 Essential (primary) hypertension: Secondary | ICD-10-CM | POA: Diagnosis not present

## 2015-03-31 DIAGNOSIS — I739 Peripheral vascular disease, unspecified: Secondary | ICD-10-CM | POA: Diagnosis not present

## 2015-03-31 DIAGNOSIS — I2510 Atherosclerotic heart disease of native coronary artery without angina pectoris: Secondary | ICD-10-CM | POA: Diagnosis not present

## 2015-03-31 DIAGNOSIS — J449 Chronic obstructive pulmonary disease, unspecified: Secondary | ICD-10-CM | POA: Diagnosis not present

## 2015-03-31 DIAGNOSIS — I472 Ventricular tachycardia: Secondary | ICD-10-CM | POA: Diagnosis not present

## 2015-03-31 DIAGNOSIS — I255 Ischemic cardiomyopathy: Secondary | ICD-10-CM | POA: Diagnosis not present

## 2015-04-01 NOTE — Progress Notes (Signed)
Cardiology Office Note Date:  04/02/2015  Patient ID:  Karl Stevenson, DOB 10/24/1926, MRN [id number] PCP:  Garlan Fillers, MD  Cardiologist:  Dr. Excell Seltzer Electrophysiologist: Dr. Graciela Husbands    Chief Complaint:   History of Present Illness: Karl Stevenson is a 81 y.o. male with history of CAD, AAA, RAS, moderate L subclavian stenosis, chronic Systolic CHF, HTN, HLD, and COPD and CRI.  He has longstanding history of palpitations, most recently found to have VT on an event monitor and was referred to [facility] for further evalaution.  He was started on IV amiodarone, then was started on Ranexa, Mexiletine, and PO amiodarone.  He developed a near syncopal event associated bradycardia 30'sBPM, though felt to be vagal, the amiodarone was stopped.  To note, records indicate his only symptom with the VT was palpitations, no near syncope or syncope.  It was recommended upon discharge to avoid nodal blocking agents with conduction disease onhis EKG and the bradycardic event  Today he feels a little tired, but "OK".  He is accompanied by his wife, they state since the hospital he has had 3 "episodes",  He mentions one event of palpitations about a week after going home, lasted a few minutes, not associated with any other particular symptoms.  The other episodes he describes are feeling like he is weak, like his legs wont hold him or are shakey.  His wife states Monday of last week was the last event, he woke to use the restroom early AM, prior to getting up though, he felt like his legs were weak, and he felt a little "Shakey", not diaphoretic or clammy, he sat at the edge of the bed, eventually walked with a walker to the restroom and back and eventually felt better.  He denies feeling like he would faint, and has not had any syncope.  This has happened while on the sofa sitting as well, but these are not associate with the palpitations.  Once he did get cool/sweaty, sat and felt better after a few minutes.  He has  a Science writer and therapist, opposite days, his wife states when they check his BP has been low and upon standing told even lower.  Orthostatic BP check here is significantly positive with the reported symptoms, legs feel shaky/weak.  When asked though, overall the patient feels like as far as his palpitations, these are improved with the addition of the medicines   Past Medical History  Diagnosis Date  . Hyperlipidemia   . Hypertension   . Carotid artery occlusion     a. s/p L CEA in 2010;  b. 06/2014 Carotid U/S: stable LICA CEA site w/o significant stenosis bilat.  . Crohn's disease (HCC)     a. s/p colon resection  . Bradycardia     a. previous intolerance to beta blockers - low dose toprol started 10/2011 for tachycardia but had rash and was discontinued;  b. tolerating low dose propranolol.  . Chronic systolic heart failure (HCC)     a. 02/2015 Echo:  35-40% (echo 11/2010); c. 02/2015 Echo: EF 20-25%, diff HK, mid-apicalanteroseptal and apical AK, Gr 1 DD, mild MR.  . Ischemic cardiomyopathy     a. EF 34% (Lexiscan 06/2011); b. 35-40% (echo 11/2010); c. 02/2015 Echo: EF 20-25%, diff HK, mid-apicalanteroseptal and apical AK, Gr 1 DD, mild MR.  . Coronary artery disease     a. 1969 s/p MI;  b. S/P CABG 1987;  c. S/P redo 2008;  d. 07/2012 Cath: LM  95, LAD 100p, 95apical after graft insertion, D1 70, LCX 100p, VG->OM1 60d, VG->mLAD from VG->OM (Y graft) nl, VG->OM2->LPDA patent, LIMA->LAD known atresis, EF 40%; e. 02/2014 MV: anterior and posterolateral/apical infarct with mild peri-infarct ischemia->Med Rx.  . Peripheral vascular disease (HCC)     a. Carotid dz s/p L CEA, RAS, AAA, LE dz  . AAA (abdominal aortic aneurysm) (HCC)     a. Ectatic abdominal aorta with fusiform dilitation 3.4x3.4 and moderate thrombus burden 12/2010  . RBBB   . Tachycardia     a. 10/2011 - atrial tach vs avnrt - BB started.  [name] COPD (chronic obstructive pulmonary disease) (HCC)   . Ventricular tachycardia (HCC)      a. 02/2015 ->amio 200 daily added.  . Gout   . Renal artery stenosis (HCC)     a. Dopplers 12/2010 - 1-59% R ostial renal artery stenosis, 2 L renal arteries both widely patent    Past Surgical History  Procedure Laterality Date  . Carotid endarterectomy Left 2010  . Colon resection  2008  . Tonsillectomy      as a child  . Pr vein bypass graft,aorto-fem-pop  10/1985  . Femoral endarterectomy Left  06/2011    ileofemoral endarterectomy with bovine patch angioplasty  . Endarterectomy  08/26/2011    Procedure: ENDARTERECTOMY ILIAC;  Surgeon: [name], MD;  Location: [facility] OR;  Service: Vascular;  Laterality: Right;  Right Femoral Artery Endarterectomy with vascu guard patch angioplasty & intraoperative arteriogram.  . Cardiac catheterization  07/28/2012    Native 3v CAD, continued patency of the SVG-OM1-LAD and SVG-OM2-left PDA. LVEF 40% with multiple WMAs  . Abdominal aortagram N/A 06/11/2011    Procedure: ABDOMINAL [name];  Surgeon: [name], MD;  Location: [facility] CATH LAB;  Service: Cardiovascular;  Laterality: N/A;  . Left heart catheterization with coronary angiogram N/A 07/28/2012    Procedure: LEFT HEART CATHETERIZATION WITH CORONARY ANGIOGRAM;  Surgeon: [name], MD;  Location: [facility] CATH LAB;  Service: Cardiovascular;  Laterality: N/A;  . Colon surgery    . Cataract extraction, bilateral    . Coronary artery bypass graft  10/31/85 & 08/19/06  . Coronary angioplasty    . Cardiac catheterization N/A 03/08/2015    Procedure: Left Heart Cath and Coronary Angiography;  Surgeon: [name], MD;  Location: [facility] INVASIVE CV LAB;  Service: Cardiovascular;  Laterality: N/A;    Current Outpatient Prescriptions  Medication Sig Dispense Refill  . acetaminophen (TYLENOL) 500 MG tablet Take 1,000 mg by mouth every 6 (six) hours as needed for pain.    [name] aspirin 81 MG tablet Take 81 mg by mouth daily.     [name] atorvastatin (LIPITOR) 10 MG tablet Take 10 mg by mouth at bedtime.     .  clopidogrel (PLAVIX) 75 MG tablet Take 75 mg by mouth daily.     [name] COLCRYS 0.6 MG tablet Take 0.6 mg by mouth daily as needed (gout).     [name] ipratropium (ATROVENT) 0.03 % nasal spray Place 1 spray into the nose daily as needed.     . isosorbide mononitrate (IMDUR) 120 MG 24 hr tablet TAKE ONE TABLET BY MOUTH ONCE DAILY. 180 tablet 2  . mexiletine (MEXITIL) 200 MG capsule Take 1 capsule (200 mg total) by mouth every 12 (twelve) hours. 60 capsule 3  . Multiple Vitamins-Minerals (MULTIVITAMIN PO) Take 2 tablets by mouth daily.     . nitroGLYCERIN (NITROSTAT) 0.4 MG SL tablet Place 0.4 mg under the tongue  every 5 (five) minutes as needed for chest pain.    [name] OVER THE COUNTER MEDICATION 1 scoop. Barley life otc medication by aim.    . pantoprazole (PROTONIX) 40 MG tablet TAKE ONE TABLET BY MOUTH ONCE DAILY 90 tablet 0  . RANEXA 500 MG 12 hr tablet Take 1 tablet by mouth every 12 (twelve) hours.    . ranolazine (RANEXA) 1000 MG SR tablet Take 1 tablet (1,000 mg total) by mouth 2 (two) times daily. 60 tablet 3   No current facility-administered medications for this visit.    Allergies:   Novocain and Metoprolol   Social History:  The patient  reports that he has never smoked. He has never used smokeless tobacco. He reports that he does not drink alcohol or use illicit drugs.   Family History:  The patient's family history includes CAD in his brother; COPD in his brother and mother; Cancer in his mother; Deep vein thrombosis in his brother; Heart disease in his father. There is no history of Anesthesia problems.  ROS:  Please see the history of present illness.   All other systems are reviewed and otherwise negative.   PHYSICAL EXAM:  Supine 108/68 70bpm Sitting 100/58, HR 69 Standing 70/46, HR 79 (+ symptoms) 2 minute stand 90/58, HR 60 VS:  BP 90/58 mmHg  Pulse 60  Ht 5\' 6"  (1.676 m)  Wt 134 lb 1.9 oz (60.836 kg)  BMI 21.66 kg/m2  SpO2 96% BMI: Body mass index is 21.66 kg/(m^2). Thin,  elderly, in no acute distress HEENT: normocephalic, atraumatic Neck: no JVD, carotid bruits or masses Cardiac:  normal S1, S2; RRR; no significant murmurs, no rubs, or gallops Lungs:  clear to auscultation bilaterally, no wheezing, rhonchi or rales Abd: soft, nontender,  + BS MS: no deformity or atrophy Ext: no edema Skin: warm and dry, no rash Neuro:  No gross deficits appreciated Psych: euthymic mood, full affect  EKG:  Done today shows SR, 73bpm, RBBB, LAFB, bifasicular block  Recent Labs: 03/05/2015: Magnesium 1.6* 03/11/2015: Hemoglobin 11.1*; Platelets 142* 03/13/2015: BUN 23*; Creatinine, Ser 1.41*; Potassium 3.8; Sodium 137  03/06/2015: Cholesterol 99; HDL 30*; LDL Cholesterol 41; Total CHOL/HDL Ratio 3.3; Triglycerides 138; VLDL 28   Estimated Creatinine Clearance: 32.3 mL/min (by C-G formula based on Cr of 1.41).   Wt Readings from Last 3 Encounters:  04/02/15 134 lb 1.9 oz (60.836 kg)  03/13/15 129 lb 12.8 oz (58.877 kg)  12/11/14 138 lb (62.596 kg)    03/08/15: Cardiac cath Impression:  Known severe native coronary disease. Disabled contrast the native arteries were not evaluated.  Occluded old SVG-OM1 - the dye remained standing in the proximal vessel for several minutes following angiography - not PCI amenable  This is the likely culprit for the patient's episode of what sounds like anginal symptoms about a month ago. This may also be a cause of now worsening ventricular tachycardia.   Widely patent Y graft with one limb providing sequential grafting to OM1 and OM 2 as well as a separate limb serving as a graft to the LAD which retrograde fills the diagonal branch. This redo graft now provides the entirety of the patient's coronary circulation.  The graft obtuse marginal branches are relatively free of disease with retrograde filling back to the distal circumflex system which consists of the posterolateral branch and left PDA.  The grafted LAD itself was a very  diffusely diseased vessel, but no change from previous.  Known small nondominant right coronary artery  system.  Borderline low LVEDP Recommendations:  No PCI targets.  Standard post radial cath care with TR band removal  Would proceed with ICD placement per EP  03/06/15: Echocardiogram Study Conclusions - Left ventricle: The cavity size was normal. Systolic function was severely reduced. The estimated ejection fraction was in the range of 20% to 25%. Diffuse hypokinesis. There is akinesis of the mid-apicalanteroseptal and apical myocardium. Doppler parameters are consistent with abnormal left ventricular relaxation (grade 1 diastolic dysfunction). - Mitral valve: Calcified annulus. There was mild regurgitation. Impressions: - When compared to prior echocardiogram, EF is reduced (prior 35%).  Other studies reviewed: Additional studies/records reviewed today include: summarized above  ASSESSMENT AND PLAN:  1. VT     Re-counseled No driving     Palpitations are improved per the patient    Continue the same Mexilitine and ranexa and f/u in 26month again, sooner if palpitations increase    The patient and his wife would prefer to avoid device implant 2. CAD, ICM     s/p cath 03/08/15, as above, no intervention/targets, medical management     Not on betablocker with bradycardia, conduction disease. no ace likely secondry to CRI, stage III by his records     no anginal c/o     on ASA, Plavix, statin     Appears compensated, no evidence of fluid OL on his exam today 3. HTN     Relative hypotension now 4. Conduction system disease     EKG is unchanged from previous     Episodes of weakness, In-hospital a more profound event then he describes associated with bradycardic event was felt to be vagally mediated, there is mention of + orthostatic BP changes at home vias the patient's wife as reported to her by the visiting RN/therapist     + othostatic hypotension here, sounds  more likely the culprit of somemost of his symptoms     Will hold his amlodipine     Encouraged adequate hydration while watching his weight for overload   Disposition: F/u with Norma Fredrickson, NP in weeks, instructed to keep that appointment, we will see him back in 6weeks for EP sooner if needed.  Current medicines are reviewed at length with the patient today.  The patient did not have any concerns regarding medicines.  Judith Blonder, PA-C 04/02/2015 11:59 AM     [facility] [address] [place] [address] [phone] (office)  [phone] (fax)

## 2015-04-02 ENCOUNTER — Ambulatory Visit (INDEPENDENT_AMBULATORY_CARE_PROVIDER_SITE_OTHER): Payer: Medicare PPO | Admitting: Physician Assistant

## 2015-04-02 ENCOUNTER — Encounter: Payer: Self-pay | Admitting: Physician Assistant

## 2015-04-02 VITALS — BP 90/58 | HR 60 | Ht 66.0 in | Wt 134.1 lb

## 2015-04-02 DIAGNOSIS — R001 Bradycardia, unspecified: Secondary | ICD-10-CM | POA: Diagnosis not present

## 2015-04-02 DIAGNOSIS — I5022 Chronic systolic (congestive) heart failure: Secondary | ICD-10-CM | POA: Diagnosis not present

## 2015-04-02 DIAGNOSIS — I255 Ischemic cardiomyopathy: Secondary | ICD-10-CM | POA: Diagnosis not present

## 2015-04-02 DIAGNOSIS — K5090 Crohn's disease, unspecified, without complications: Secondary | ICD-10-CM | POA: Diagnosis not present

## 2015-04-02 DIAGNOSIS — I2510 Atherosclerotic heart disease of native coronary artery without angina pectoris: Secondary | ICD-10-CM | POA: Diagnosis not present

## 2015-04-02 DIAGNOSIS — I472 Ventricular tachycardia, unspecified: Secondary | ICD-10-CM

## 2015-04-02 DIAGNOSIS — I739 Peripheral vascular disease, unspecified: Secondary | ICD-10-CM | POA: Diagnosis not present

## 2015-04-02 DIAGNOSIS — E785 Hyperlipidemia, unspecified: Secondary | ICD-10-CM | POA: Diagnosis not present

## 2015-04-02 DIAGNOSIS — I10 Essential (primary) hypertension: Secondary | ICD-10-CM | POA: Diagnosis not present

## 2015-04-02 DIAGNOSIS — J449 Chronic obstructive pulmonary disease, unspecified: Secondary | ICD-10-CM | POA: Diagnosis not present

## 2015-04-02 DIAGNOSIS — I25810 Atherosclerosis of coronary artery bypass graft(s) without angina pectoris: Secondary | ICD-10-CM

## 2015-04-02 DIAGNOSIS — I951 Orthostatic hypotension: Secondary | ICD-10-CM

## 2015-04-02 NOTE — Patient Instructions (Signed)
Medication Instructions:   STOP TAKING AMLODIPINE    If you need a refill on your cardiac medications before your next appointment, please call your pharmacy.  Labwork: NONE ORDER TODAY    Testing/Procedures:  NONE ORDER TODAY    Follow-Up:    IN 6 WEEKS WITH RENEE URSUY   Any Other Special Instructions Will Be Listed Below (If Applicable).  PLEASE GET YOUR NURSE TO CHART BLOOD PRESSURE READINGS AND NOTIFY CARDIAC OFFICE IF BLOOD PRESSURE IS     RUNNIING IN THE 160'S CONSISTENTLY WITH STOPPING AMLODIPINE.

## 2015-04-03 DIAGNOSIS — I255 Ischemic cardiomyopathy: Secondary | ICD-10-CM | POA: Diagnosis not present

## 2015-04-03 DIAGNOSIS — K5090 Crohn's disease, unspecified, without complications: Secondary | ICD-10-CM | POA: Diagnosis not present

## 2015-04-03 DIAGNOSIS — J449 Chronic obstructive pulmonary disease, unspecified: Secondary | ICD-10-CM | POA: Diagnosis not present

## 2015-04-03 DIAGNOSIS — I2510 Atherosclerotic heart disease of native coronary artery without angina pectoris: Secondary | ICD-10-CM | POA: Diagnosis not present

## 2015-04-03 DIAGNOSIS — I5022 Chronic systolic (congestive) heart failure: Secondary | ICD-10-CM | POA: Diagnosis not present

## 2015-04-03 DIAGNOSIS — I739 Peripheral vascular disease, unspecified: Secondary | ICD-10-CM | POA: Diagnosis not present

## 2015-04-03 DIAGNOSIS — I472 Ventricular tachycardia: Secondary | ICD-10-CM | POA: Diagnosis not present

## 2015-04-03 DIAGNOSIS — E785 Hyperlipidemia, unspecified: Secondary | ICD-10-CM | POA: Diagnosis not present

## 2015-04-03 DIAGNOSIS — I10 Essential (primary) hypertension: Secondary | ICD-10-CM | POA: Diagnosis not present

## 2015-04-08 ENCOUNTER — Other Ambulatory Visit: Payer: Self-pay | Admitting: Cardiovascular Disease

## 2015-04-08 DIAGNOSIS — I255 Ischemic cardiomyopathy: Secondary | ICD-10-CM | POA: Diagnosis not present

## 2015-04-08 DIAGNOSIS — I10 Essential (primary) hypertension: Secondary | ICD-10-CM | POA: Diagnosis not present

## 2015-04-08 DIAGNOSIS — I2510 Atherosclerotic heart disease of native coronary artery without angina pectoris: Secondary | ICD-10-CM | POA: Diagnosis not present

## 2015-04-08 DIAGNOSIS — K5090 Crohn's disease, unspecified, without complications: Secondary | ICD-10-CM | POA: Diagnosis not present

## 2015-04-08 DIAGNOSIS — J449 Chronic obstructive pulmonary disease, unspecified: Secondary | ICD-10-CM | POA: Diagnosis not present

## 2015-04-08 DIAGNOSIS — I472 Ventricular tachycardia: Secondary | ICD-10-CM | POA: Diagnosis not present

## 2015-04-08 DIAGNOSIS — E785 Hyperlipidemia, unspecified: Secondary | ICD-10-CM | POA: Diagnosis not present

## 2015-04-08 DIAGNOSIS — I739 Peripheral vascular disease, unspecified: Secondary | ICD-10-CM | POA: Diagnosis not present

## 2015-04-08 DIAGNOSIS — I5022 Chronic systolic (congestive) heart failure: Secondary | ICD-10-CM | POA: Diagnosis not present

## 2015-04-09 DIAGNOSIS — E785 Hyperlipidemia, unspecified: Secondary | ICD-10-CM | POA: Diagnosis not present

## 2015-04-09 DIAGNOSIS — I2510 Atherosclerotic heart disease of native coronary artery without angina pectoris: Secondary | ICD-10-CM | POA: Diagnosis not present

## 2015-04-09 DIAGNOSIS — I10 Essential (primary) hypertension: Secondary | ICD-10-CM | POA: Diagnosis not present

## 2015-04-09 DIAGNOSIS — K5090 Crohn's disease, unspecified, without complications: Secondary | ICD-10-CM | POA: Diagnosis not present

## 2015-04-09 DIAGNOSIS — I472 Ventricular tachycardia: Secondary | ICD-10-CM | POA: Diagnosis not present

## 2015-04-09 DIAGNOSIS — I255 Ischemic cardiomyopathy: Secondary | ICD-10-CM | POA: Diagnosis not present

## 2015-04-09 DIAGNOSIS — J449 Chronic obstructive pulmonary disease, unspecified: Secondary | ICD-10-CM | POA: Diagnosis not present

## 2015-04-09 DIAGNOSIS — I739 Peripheral vascular disease, unspecified: Secondary | ICD-10-CM | POA: Diagnosis not present

## 2015-04-09 DIAGNOSIS — I5022 Chronic systolic (congestive) heart failure: Secondary | ICD-10-CM | POA: Diagnosis not present

## 2015-04-10 DIAGNOSIS — R21 Rash and other nonspecific skin eruption: Secondary | ICD-10-CM | POA: Diagnosis not present

## 2015-04-10 DIAGNOSIS — I739 Peripheral vascular disease, unspecified: Secondary | ICD-10-CM | POA: Diagnosis not present

## 2015-04-10 DIAGNOSIS — K5090 Crohn's disease, unspecified, without complications: Secondary | ICD-10-CM | POA: Diagnosis not present

## 2015-04-10 DIAGNOSIS — I2510 Atherosclerotic heart disease of native coronary artery without angina pectoris: Secondary | ICD-10-CM | POA: Diagnosis not present

## 2015-04-10 DIAGNOSIS — R531 Weakness: Secondary | ICD-10-CM | POA: Diagnosis not present

## 2015-04-10 DIAGNOSIS — I5022 Chronic systolic (congestive) heart failure: Secondary | ICD-10-CM | POA: Diagnosis not present

## 2015-04-10 DIAGNOSIS — E785 Hyperlipidemia, unspecified: Secondary | ICD-10-CM | POA: Diagnosis not present

## 2015-04-10 DIAGNOSIS — N183 Chronic kidney disease, stage 3 (moderate): Secondary | ICD-10-CM | POA: Diagnosis not present

## 2015-04-10 DIAGNOSIS — I10 Essential (primary) hypertension: Secondary | ICD-10-CM | POA: Diagnosis not present

## 2015-04-10 DIAGNOSIS — J449 Chronic obstructive pulmonary disease, unspecified: Secondary | ICD-10-CM | POA: Diagnosis not present

## 2015-04-10 DIAGNOSIS — Z6822 Body mass index (BMI) 22.0-22.9, adult: Secondary | ICD-10-CM | POA: Diagnosis not present

## 2015-04-10 DIAGNOSIS — I499 Cardiac arrhythmia, unspecified: Secondary | ICD-10-CM | POA: Diagnosis not present

## 2015-04-10 DIAGNOSIS — I255 Ischemic cardiomyopathy: Secondary | ICD-10-CM | POA: Diagnosis not present

## 2015-04-10 DIAGNOSIS — I472 Ventricular tachycardia: Secondary | ICD-10-CM | POA: Diagnosis not present

## 2015-04-11 ENCOUNTER — Other Ambulatory Visit: Payer: Self-pay | Admitting: Cardiovascular Disease

## 2015-04-15 DIAGNOSIS — E784 Other hyperlipidemia: Secondary | ICD-10-CM | POA: Diagnosis not present

## 2015-04-15 DIAGNOSIS — R531 Weakness: Secondary | ICD-10-CM | POA: Diagnosis not present

## 2015-04-15 DIAGNOSIS — I2510 Atherosclerotic heart disease of native coronary artery without angina pectoris: Secondary | ICD-10-CM | POA: Diagnosis not present

## 2015-04-15 DIAGNOSIS — I10 Essential (primary) hypertension: Secondary | ICD-10-CM | POA: Diagnosis not present

## 2015-04-15 DIAGNOSIS — I499 Cardiac arrhythmia, unspecified: Secondary | ICD-10-CM | POA: Diagnosis not present

## 2015-04-15 DIAGNOSIS — J302 Other seasonal allergic rhinitis: Secondary | ICD-10-CM | POA: Diagnosis not present

## 2015-04-15 DIAGNOSIS — R05 Cough: Secondary | ICD-10-CM | POA: Diagnosis not present

## 2015-04-15 DIAGNOSIS — Z6821 Body mass index (BMI) 21.0-21.9, adult: Secondary | ICD-10-CM | POA: Diagnosis not present

## 2015-04-16 ENCOUNTER — Encounter: Payer: Self-pay | Admitting: Nurse Practitioner

## 2015-04-16 ENCOUNTER — Ambulatory Visit (INDEPENDENT_AMBULATORY_CARE_PROVIDER_SITE_OTHER): Payer: Medicare PPO | Admitting: Nurse Practitioner

## 2015-04-16 VITALS — BP 120/70 | HR 77 | Ht 67.0 in | Wt 135.8 lb

## 2015-04-16 DIAGNOSIS — I5022 Chronic systolic (congestive) heart failure: Secondary | ICD-10-CM | POA: Diagnosis not present

## 2015-04-16 DIAGNOSIS — I255 Ischemic cardiomyopathy: Secondary | ICD-10-CM | POA: Diagnosis not present

## 2015-04-16 DIAGNOSIS — I2510 Atherosclerotic heart disease of native coronary artery without angina pectoris: Secondary | ICD-10-CM | POA: Diagnosis not present

## 2015-04-16 DIAGNOSIS — I10 Essential (primary) hypertension: Secondary | ICD-10-CM

## 2015-04-16 DIAGNOSIS — I2589 Other forms of chronic ischemic heart disease: Secondary | ICD-10-CM | POA: Diagnosis not present

## 2015-04-16 DIAGNOSIS — I472 Ventricular tachycardia, unspecified: Secondary | ICD-10-CM

## 2015-04-16 DIAGNOSIS — I739 Peripheral vascular disease, unspecified: Secondary | ICD-10-CM | POA: Diagnosis not present

## 2015-04-16 LAB — BASIC METABOLIC PANEL
BUN: 25 mg/dL (ref 7–25)
CO2: 22 mmol/L (ref 20–31)
Calcium: 8.7 mg/dL (ref 8.6–10.3)
Chloride: 108 mmol/L (ref 98–110)
Creat: 1.4 mg/dL — ABNORMAL HIGH (ref 0.70–1.11)
Glucose, Bld: 88 mg/dL (ref 65–99)
Potassium: 4.4 mmol/L (ref 3.5–5.3)
Sodium: 139 mmol/L (ref 135–146)

## 2015-04-16 LAB — CBC
HCT: 36.8 % — ABNORMAL LOW (ref 39.0–52.0)
Hemoglobin: 12.1 g/dL — ABNORMAL LOW (ref 13.0–17.0)
MCH: 33.6 pg (ref 26.0–34.0)
MCHC: 32.9 g/dL (ref 30.0–36.0)
MCV: 102.2 fL — ABNORMAL HIGH (ref 78.0–100.0)
MPV: 10 fL (ref 8.6–12.4)
Platelets: 193 10*3/uL (ref 150–400)
RBC: 3.6 MIL/uL — ABNORMAL LOW (ref 4.22–5.81)
RDW: 13.2 % (ref 11.5–15.5)
WBC: 6.9 10*3/uL (ref 4.0–10.5)

## 2015-04-16 LAB — MAGNESIUM: Magnesium: 1.7 mg/dL (ref 1.5–2.5)

## 2015-04-16 NOTE — Patient Instructions (Addendum)
We will be checking the following labs today - BMET, CBC, MG   Medication Instructions:    Continue with your current medicines.     Testing/Procedures To Be Arranged:  N/A  Follow-Up:   See me in 3 months.     Other Special Instructions:   Go back to wearing your support stockings.   If BP is low (like in the 70s or 80s on the top number) have some salt.     If you need a refill on your cardiac medications before your next appointment, please call your pharmacy.   Call the [facility] office at [phone] if you have any questions, problems or concerns.

## 2015-04-16 NOTE — Progress Notes (Signed)
CARDIOLOGY OFFICE NOTE  Date:  04/16/2015    Karl Stevenson Date of Birth: 1927-01-12 Medical Record #[id number]  PCP:  Garlan Fillers, MD  Cardiologist:  Excell Seltzer    Chief Complaint  Patient presents with  . Coronary Artery Disease    Follow up visit - seen for Dr. Excell Seltzer  . Hyperlipidemia  . Hypertension  . Irregular Heart Beat    History of Present Illness: Karl Stevenson is a 81 y.o. male who presents today for a follow up visit. Seen for Dr. Excell Seltzer. He has CAD with past CABG in 1987, with redo in 2008 and last cath in 2014 showing patent SVG to OM1/OM2, patent SVG to LAD, patent SVG to OM3. He has an ischemic DCM EF 35-40% by echo in 2012, PVD with Left CEA and mild to moderate left subclavian stenosis and known atretic LIMA graft. He has renal artery stenosis and AAA, chronic systolic CHF, HTN, dyslipidemia, RBBB and COPD. He has had remote bout of colitis/Chron's and had to be hospitalized. He has had chronic stable angina for years. Other issues include bradycardia and tachycardia with a bifascicular block but has not required PTVP implant. He previously used prn propranolol if his heart rate is elevated and had not been maintained on any chronic beta blocker therapy.   Seen several times over the past year - has had some progressive angina - Myoview got updated and showed scar and only perhaps mild ischemia (per Dr. Jenene Slicker review) and no further testing indicated and medicines were adjusted. Had more issues with his heart rate as well - got put on daily dosing of Inderal.   I last saw him in August - his cardiac status was ok - having more GI issues and he had forgot that he had had Crohn's.   Presented to the [facility] ED on 03/05/2015 for tachycardia. He had noted some palpitations and I placed an event monitor - he was noted to have 36 second run of VT and referred to the hospital. He had observed VT here in the hospital treated with IV then PO amiodarone,  after much discussion regarding device implant verses medical therapy, he was placed on Mexiletine and Ranexa with supression of his VT. The patient with his VT felt palpitations, some SOB, no near syncope or syncope though, no CP. He did develop a brief symptomatic bradycardic spell, felt likely to be more of a vasovagal event, though his amiodarone was stopped at that point. His HR and rhythm have remained stable since then and NO device implant is being recommended at this time. Was to avoid nodal blocking agents for BP control given conduction disease and bradycardia noted during his stay. THE PATIENT HAS BEEN INSTRUCTED NO DRIVING FOR .  He was seen by Francis Dowse, PA right before Thanksgiving. He was having orthostasis. Norvasc was stopped. His palpitations seemed improved. Noted that he wished to avoid device implant.   Comes back today. Here with his wife. He is not driving. Not doing well. Having weak spells but says he is having less of them. Has to lay back down. He will shake, legs tremble. Had one this morning - HR was 100 and BP 98/60. Spell before that was about 2 weeks prior. No chest pain. Breathing is ok. BP diary from home is reviewed and for the most part, BP is ok, but will have some low and high readings on occasion. Tolerating his medicines. No falls. Trying to get more in  the way of activity.    Past Medical History  Diagnosis Date  . Hyperlipidemia   . Hypertension   . Carotid artery occlusion     a. s/p L CEA in 2010;  b. 06/2014 Carotid U/S: stable LICA CEA site w/o significant stenosis bilat.  . Crohn's disease (HCC)     a. s/p colon resection  . Bradycardia     a. previous intolerance to beta blockers - low dose toprol started 10/2011 for tachycardia but had rash and was discontinued;  b. tolerating low dose propranolol.  . Chronic systolic heart failure (HCC)     a. 02/2015 Echo:  35-40% (echo 11/2010); c. 02/2015 Echo: EF 20-25%, diff HK, mid-apicalanteroseptal  and apical AK, Gr 1 DD, mild MR.  . Ischemic cardiomyopathy     a. EF 34% (Lexiscan 06/2011); b. 35-40% (echo 11/2010); c. 02/2015 Echo: EF 20-25%, diff HK, mid-apicalanteroseptal and apical AK, Gr 1 DD, mild MR.  . Coronary artery disease     a. 1969 s/p MI;  b. S/P CABG 1987;  c. S/P redo 2008;  d. 07/2012 Cath: LM 95, LAD 100p, 95apical after graft insertion, D1 70, LCX 100p, VG->OM1 60d, VG->mLAD from VG->OM (Y graft) nl, VG->OM2->LPDA patent, LIMA->LAD known atresis, EF 40%; e. 02/2014 MV: anterior and posterolateral/apical infarct with mild peri-infarct ischemia->Med Rx.  . Peripheral vascular disease (HCC)     a. Carotid dz s/p L CEA, RAS, AAA, LE dz  . AAA (abdominal aortic aneurysm) (HCC)     a. Ectatic abdominal aorta with fusiform dilitation 3.4x3.4 and moderate thrombus burden 12/2010  . RBBB   . Tachycardia     a. 10/2011 - atrial tach vs avnrt - BB started.  [name] COPD (chronic obstructive pulmonary disease) (HCC)   . Ventricular tachycardia (HCC)     a. 02/2015 ->amio 200 daily added.  . Gout   . Renal artery stenosis (HCC)     a. Dopplers 12/2010 - 1-59% R ostial renal artery stenosis, 2 L renal arteries both widely patent    Past Surgical History  Procedure Laterality Date  . Carotid endarterectomy Left 2010  . Colon resection  2008  . Tonsillectomy      as a child  . Pr vein bypass graft,aorto-fem-pop  10/1985  . Femoral endarterectomy Left  06/2011    ileofemoral endarterectomy with bovine patch angioplasty  . Endarterectomy  08/26/2011    Procedure: ENDARTERECTOMY ILIAC;  Surgeon: [name], MD;  Location: [facility] OR;  Service: Vascular;  Laterality: Right;  Right Femoral Artery Endarterectomy with vascu guard patch angioplasty & intraoperative arteriogram.  . Cardiac catheterization  07/28/2012    Native 3v CAD, continued patency of the SVG-OM1-LAD and SVG-OM2-left PDA. LVEF 40% with multiple WMAs  . Abdominal aortagram N/A 06/11/2011    Procedure: ABDOMINAL [name];  Surgeon:  [name], MD;  Location: [facility] CATH LAB;  Service: Cardiovascular;  Laterality: N/A;  . Left heart catheterization with coronary angiogram N/A 07/28/2012    Procedure: LEFT HEART CATHETERIZATION WITH CORONARY ANGIOGRAM;  Surgeon: [name], MD;  Location: [facility] CATH LAB;  Service: Cardiovascular;  Laterality: N/A;  . Colon surgery    . Cataract extraction, bilateral    . Coronary artery bypass graft  10/31/85 & 08/19/06  . Coronary angioplasty    . Cardiac catheterization N/A 03/08/2015    Procedure: Left Heart Cath and Coronary Angiography;  Surgeon: [name], MD;  Location: [facility] INVASIVE CV LAB;  Service: Cardiovascular;  Laterality: N/A;  Medications: Current Outpatient Prescriptions  Medication Sig Dispense Refill  . acetaminophen (TYLENOL) 500 MG tablet Take 1,000 mg by mouth every 6 (six) hours as needed for pain.    [name] aspirin 81 MG tablet Take 81 mg by mouth daily.     [name] atorvastatin (LIPITOR) 10 MG tablet Take 10 mg by mouth at bedtime.     . benzonatate (TESSALON) 100 MG capsule Take 100 mg by mouth 3 (three) times daily as needed for cough.     . clopidogrel (PLAVIX) 75 MG tablet Take 75 mg by mouth daily.     [name] COLCRYS 0.6 MG tablet Take 0.6 mg by mouth daily as needed (gout).     [name] ipratropium (ATROVENT) 0.03 % nasal spray Place 1 spray into the nose daily as needed.     . isosorbide mononitrate (IMDUR) 120 MG 24 hr tablet TAKE ONE TABLET BY MOUTH ONCE DAILY. 180 tablet 2  . mexiletine (MEXITIL) 200 MG capsule Take 1 capsule (200 mg total) by mouth every 12 (twelve) hours. 60 capsule 3  . Multiple Vitamins-Minerals (MULTIVITAMIN PO) Take 2 tablets by mouth daily.     . nitroGLYCERIN (NITROSTAT) 0.4 MG SL tablet Place 0.4 mg under the tongue every 5 (five) minutes as needed for chest pain.    [name] OVER THE COUNTER MEDICATION 1 scoop. Barley life otc medication by aim.    . pantoprazole (PROTONIX) 40 MG tablet TAKE ONE TABLET BY MOUTH ONCE DAILY 90 tablet 2  . ranolazine  (RANEXA) 1000 MG SR tablet Take 1 tablet (1,000 mg total) by mouth 2 (two) times daily. 60 tablet 3   No current facility-administered medications for this visit.    Allergies: Allergies  Allergen Reactions  . Novocain [Procaine Hcl] Other (See Comments)    Cold sweats and very bad headache.   . Metoprolol Hives, Itching and Rash    Social History: The patient  reports that he has never smoked. He has never used smokeless tobacco. He reports that he does not drink alcohol or use illicit drugs.   Family History: The patient's family history includes CAD in his brother; COPD in his brother and mother; Cancer in his mother; Deep vein thrombosis in his brother; Heart disease in his father. There is no history of Anesthesia problems.   Review of Systems: Please see the history of present illness.   Otherwise, the review of systems is positive for none.   All other systems are reviewed and negative.   Physical Exam: VS:  BP 120/70 mmHg  Pulse 77  Ht 5\' 7"  (1.702 m)  Wt 135 lb 12.8 oz (61.598 kg)  BMI 21.26 kg/m2 .  BMI Body mass index is 21.26 kg/(m^2).  Wt Readings from Last 3 Encounters:  04/16/15 135 lb 12.8 oz (61.598 kg)  04/02/15 134 lb 1.9 oz (60.836 kg)  03/13/15 129 lb 12.8 oz (58.877 kg)    BP 110/60 sitting and 124/70 standing by me  General: Pleasant. Elderly male. He is alert and in no acute distress. HEENT: Normal. Neck: Supple, no JVD, carotid bruits, or masses noted.  Cardiac: Regular rate and rhythm.  No edema.  Respiratory:  Lungs are clear to auscultation bilaterally with normal work of breathing.  GI: Soft and nontender.  MS: No deformity or atrophy. Gait and ROM intact. Skin: Warm and dry. Color is normal.  Neuro:  Strength and sensation are intact and no gross focal deficits noted.  Psych: Alert, appropriate and with normal affect.  LABORATORY DATA:  EKG:  EKG is ordered today. This demonstrates NSR with 1st degree AV block and bifascicular  block.  Lab Results  Component Value Date   WBC 5.5 03/11/2015   HGB 11.1* 03/11/2015   HCT 33.2* 03/11/2015   PLT 142* 03/11/2015   GLUCOSE 103* 03/13/2015   CHOL 99 03/06/2015   TRIG 138 03/06/2015   HDL 30* 03/06/2015   LDLCALC 41 03/06/2015   ALT 16 02/13/2014   AST 20 02/13/2014   NA 137 03/13/2015   K 3.8 03/13/2015   CL 101 03/13/2015   CREATININE 1.41* 03/13/2015   BUN 23* 03/13/2015   CO2 26 03/13/2015   TSH 3.850 02/13/2014   INR 1.20 03/06/2015    BNP (last 3 results) No results for input(s): BNP in the last 8760 hours.  ProBNP (last 3 results) No results for input(s): PROBNP in the last 8760 hours.   Other Studies Reviewed Today:  03/08/15: Cardiac cath Impression:  Known severe native coronary disease. Disabled contrast the native arteries were not evaluated.  Occluded old SVG-OM1 - the dye remained standing in the proximal vessel for several minutes following angiography - not PCI amenable  This is the likely culprit for the patient's episode of what sounds like anginal symptoms about a month ago. This may also be a cause of now worsening ventricular tachycardia.   Widely patent Y graft with one limb providing sequential grafting to OM1 and OM 2 as well as a separate limb serving as a graft to the LAD which retrograde fills the diagonal branch. This redo graft now provides the entirety of the patient's coronary circulation.  The graft obtuse marginal branches are relatively free of disease with retrograde filling back to the distal circumflex system which consists of the posterolateral branch and left PDA.  The grafted LAD itself was a very diffusely diseased vessel, but no change from previous.  Known small nondominant right coronary artery system.  Borderline low LVEDP Recommendations:  No PCI targets.  Standard post radial cath care with TR band removal  Would proceed with ICD placement per EP  03/06/15: Echocardiogram Study  Conclusions - Left ventricle: The cavity size was normal. Systolic function was severely reduced. The estimated ejection fraction was in the range of 20% to 25%. Diffuse hypokinesis. There is akinesis of the mid-apicalanteroseptal and apical myocardium. Doppler parameters are consistent with abnormal left ventricular relaxation (grade 1 diastolic dysfunction). - Mitral valve: Calcified annulus. There was mild regurgitation. Impressions: - When compared to prior echocardiogram, EF is reduced (prior 35%).  MYOCARDIAL IMAGING WITH SPECT (REST AND PHARMACOLOGIC-STRESS)  GATED LEFT VENTRICULAR WALL MOTION STUDY  LEFT VENTRICULAR EJECTION FRACTION  TECHNIQUE:  Standard myocardial SPECT imaging was performed after resting  intravenous injection of 10 mCi Tc-69m sestamibi. Subsequently,  intravenous infusion of Lexiscan was performed under the supervision  of the Cardiology staff. At peak effect of the drug, 30 mCi Tc-75m  sestamibi was injected intravenously and standard myocardial SPECT  imaging was performed. Quantitative gated imaging was also performed  to evaluate left ventricular wall motion, and estimate left  ventricular ejection fraction.  COMPARISON: Chest x-ray 02/12/2014  FINDINGS:  Perfusion: Large fixed defect in radiotracer uptake in the anterior  wall from the mid ventricle to the true apex wrapping around into  the apical posterolateral wall. There is a suggestion of slightly  exaggerated decreased radiotracer uptake in the antral lateral wall  in the mid ventricle which may represent a small focus of  superimposed  reversibility.  Wall Motion: Hypokinesis bordering on akinesis involving the  anterior wall and true apex. Hypokinesis of the posterolateral wall.  Left Ventricular Ejection Fraction: 45 %  End diastolic volume 109 ml  End systolic volume 59 ml   IMPRESSION:  1. Large fixed defect with associated hypokinesis beginning in  the  anterior wall of the mid ventricle extending around the true apex  and into the posterolateral apical wall consistent with a region of  remote infarct/ scarring. There may be a small amount of  superimposed reversibility in anterolateral wall of the mid  ventricle.  2. Normal left ventricular wall motion.  3. Left ventricular ejection fraction 45%  4. Moderate-risk stress test findings*.   By: Malachy Moan M.D.  On: 02/13/2014 11:23   Assessment / Plan: 1. CAD - managed medically -  Recent cath with no targets for PCI - he is without symptoms.   2. HTN - BP overall looks ok but with some lability - would keep on his current regimen. He remains off of Norvasc. He is NOT orthostatic today.   3. HLD - on statin therapy   4. PVD - followed by [facility].   5. Tachycardia/bradycardia with bifascicular block- back off of AV nodal agents  6. VT - now on AAD therapy along with Ranexa - doing ok clinically.    Current medicines are reviewed with the patient today.  The patient does not have concerns regarding medicines other than what has been noted above.  The following changes have been made:  See above.  Labs/ tests ordered today include:    Orders Placed This Encounter  Procedures  . Basic metabolic panel  . CBC  . Magnesium  . EKG 12-Lead     Disposition:   FU with me in 3 months. Seeing EP later this month. Will need to follow closely going forward.   Patient is agreeable to this plan and will call if any problems develop in the interim.   Signed: Rosalio Macadamia, RN, ANP-C 04/16/2015 11:17 AM  [facility] [address] [place], [place]  [address] Phone: [phone] Fax: [phone]

## 2015-04-24 DIAGNOSIS — I472 Ventricular tachycardia: Secondary | ICD-10-CM | POA: Diagnosis not present

## 2015-04-24 DIAGNOSIS — I739 Peripheral vascular disease, unspecified: Secondary | ICD-10-CM | POA: Diagnosis not present

## 2015-04-24 DIAGNOSIS — I10 Essential (primary) hypertension: Secondary | ICD-10-CM | POA: Diagnosis not present

## 2015-04-24 DIAGNOSIS — I2510 Atherosclerotic heart disease of native coronary artery without angina pectoris: Secondary | ICD-10-CM | POA: Diagnosis not present

## 2015-04-24 DIAGNOSIS — I255 Ischemic cardiomyopathy: Secondary | ICD-10-CM | POA: Diagnosis not present

## 2015-04-24 DIAGNOSIS — K5090 Crohn's disease, unspecified, without complications: Secondary | ICD-10-CM | POA: Diagnosis not present

## 2015-04-24 DIAGNOSIS — E785 Hyperlipidemia, unspecified: Secondary | ICD-10-CM | POA: Diagnosis not present

## 2015-04-24 DIAGNOSIS — J449 Chronic obstructive pulmonary disease, unspecified: Secondary | ICD-10-CM | POA: Diagnosis not present

## 2015-04-24 DIAGNOSIS — I5022 Chronic systolic (congestive) heart failure: Secondary | ICD-10-CM | POA: Diagnosis not present

## 2015-04-26 ENCOUNTER — Encounter (HOSPITAL_COMMUNITY): Payer: Self-pay

## 2015-04-26 ENCOUNTER — Observation Stay (HOSPITAL_COMMUNITY)
Admission: EM | Admit: 2015-04-26 | Discharge: 2015-04-27 | Disposition: A | Discharge status: Home / Self Care | Payer: Medicare PPO | Attending: Internal Medicine | Admitting: Internal Medicine

## 2015-04-26 ENCOUNTER — Emergency Department (HOSPITAL_COMMUNITY): Payer: Medicare PPO

## 2015-04-26 DIAGNOSIS — M109 Gout, unspecified: Secondary | ICD-10-CM | POA: Insufficient documentation

## 2015-04-26 DIAGNOSIS — I4729 Other ventricular tachycardia: Secondary | ICD-10-CM

## 2015-04-26 DIAGNOSIS — K5090 Crohn's disease, unspecified, without complications: Secondary | ICD-10-CM | POA: Insufficient documentation

## 2015-04-26 DIAGNOSIS — I714 Abdominal aortic aneurysm, without rupture: Secondary | ICD-10-CM | POA: Insufficient documentation

## 2015-04-26 DIAGNOSIS — N183 Chronic kidney disease, stage 3 unspecified: Secondary | ICD-10-CM | POA: Diagnosis present

## 2015-04-26 DIAGNOSIS — I2510 Atherosclerotic heart disease of native coronary artery without angina pectoris: Secondary | ICD-10-CM | POA: Insufficient documentation

## 2015-04-26 DIAGNOSIS — Z7902 Long term (current) use of antithrombotics/antiplatelets: Secondary | ICD-10-CM | POA: Insufficient documentation

## 2015-04-26 DIAGNOSIS — Z66 Do not resuscitate: Secondary | ICD-10-CM | POA: Diagnosis not present

## 2015-04-26 DIAGNOSIS — I739 Peripheral vascular disease, unspecified: Secondary | ICD-10-CM | POA: Diagnosis not present

## 2015-04-26 DIAGNOSIS — I255 Ischemic cardiomyopathy: Secondary | ICD-10-CM | POA: Diagnosis present

## 2015-04-26 DIAGNOSIS — I130 Hypertensive heart and chronic kidney disease with heart failure and stage 1 through stage 4 chronic kidney disease, or unspecified chronic kidney disease: Secondary | ICD-10-CM | POA: Diagnosis not present

## 2015-04-26 DIAGNOSIS — Z79899 Other long term (current) drug therapy: Secondary | ICD-10-CM | POA: Insufficient documentation

## 2015-04-26 DIAGNOSIS — Z951 Presence of aortocoronary bypass graft: Secondary | ICD-10-CM | POA: Diagnosis present

## 2015-04-26 DIAGNOSIS — R4182 Altered mental status, unspecified: Secondary | ICD-10-CM | POA: Diagnosis not present

## 2015-04-26 DIAGNOSIS — Z7982 Long term (current) use of aspirin: Secondary | ICD-10-CM | POA: Insufficient documentation

## 2015-04-26 DIAGNOSIS — I5022 Chronic systolic (congestive) heart failure: Secondary | ICD-10-CM | POA: Insufficient documentation

## 2015-04-26 DIAGNOSIS — R55 Syncope and collapse: Principal | ICD-10-CM | POA: Diagnosis present

## 2015-04-26 DIAGNOSIS — R531 Weakness: Secondary | ICD-10-CM | POA: Diagnosis not present

## 2015-04-26 DIAGNOSIS — I252 Old myocardial infarction: Secondary | ICD-10-CM | POA: Diagnosis not present

## 2015-04-26 DIAGNOSIS — I340 Nonrheumatic mitral (valve) insufficiency: Secondary | ICD-10-CM | POA: Insufficient documentation

## 2015-04-26 DIAGNOSIS — I472 Ventricular tachycardia: Secondary | ICD-10-CM | POA: Diagnosis not present

## 2015-04-26 DIAGNOSIS — I452 Bifascicular block: Secondary | ICD-10-CM | POA: Insufficient documentation

## 2015-04-26 DIAGNOSIS — R404 Transient alteration of awareness: Secondary | ICD-10-CM | POA: Diagnosis not present

## 2015-04-26 DIAGNOSIS — E785 Hyperlipidemia, unspecified: Secondary | ICD-10-CM | POA: Diagnosis not present

## 2015-04-26 DIAGNOSIS — Z888 Allergy status to other drugs, medicaments and biological substances status: Secondary | ICD-10-CM | POA: Insufficient documentation

## 2015-04-26 DIAGNOSIS — I701 Atherosclerosis of renal artery: Secondary | ICD-10-CM | POA: Diagnosis not present

## 2015-04-26 DIAGNOSIS — J449 Chronic obstructive pulmonary disease, unspecified: Secondary | ICD-10-CM | POA: Insufficient documentation

## 2015-04-26 DIAGNOSIS — I10 Essential (primary) hypertension: Secondary | ICD-10-CM | POA: Diagnosis present

## 2015-04-26 LAB — CBC
HEMATOCRIT: 33.8 % — AB (ref 39.0–52.0)
Hemoglobin: 11.2 g/dL — ABNORMAL LOW (ref 13.0–17.0)
MCH: 33.9 pg (ref 26.0–34.0)
MCHC: 33.1 g/dL (ref 30.0–36.0)
MCV: 102.4 fL — AB (ref 78.0–100.0)
Platelets: 161 10*3/uL (ref 150–400)
RBC: 3.3 MIL/uL — AB (ref 4.22–5.81)
RDW: 13.7 % (ref 11.5–15.5)
WBC: 6.7 10*3/uL (ref 4.0–10.5)

## 2015-04-26 LAB — I-STAT CHEM 8, ED
BUN: 28 mg/dL — ABNORMAL HIGH (ref 6–20)
CHLORIDE: 106 mmol/L (ref 101–111)
CREATININE: 1.5 mg/dL — AB (ref 0.61–1.24)
Calcium, Ion: 1.14 mmol/L (ref 1.13–1.30)
GLUCOSE: 119 mg/dL — AB (ref 65–99)
HEMATOCRIT: 34 % — AB (ref 39.0–52.0)
Hemoglobin: 11.6 g/dL — ABNORMAL LOW (ref 13.0–17.0)
POTASSIUM: 4.3 mmol/L (ref 3.5–5.1)
Sodium: 142 mmol/L (ref 135–145)
TCO2: 24 mmol/L (ref 0–100)

## 2015-04-26 LAB — COMPREHENSIVE METABOLIC PANEL
ALT: 17 U/L (ref 17–63)
AST: 21 U/L (ref 15–41)
Albumin: 3.4 g/dL — ABNORMAL LOW (ref 3.5–5.0)
Alkaline Phosphatase: 64 U/L (ref 38–126)
Anion gap: 9 (ref 5–15)
BUN: 26 mg/dL — AB (ref 6–20)
CHLORIDE: 108 mmol/L (ref 101–111)
CO2: 22 mmol/L (ref 22–32)
Calcium: 8.6 mg/dL — ABNORMAL LOW (ref 8.9–10.3)
Creatinine, Ser: 1.54 mg/dL — ABNORMAL HIGH (ref 0.61–1.24)
GFR, EST AFRICAN AMERICAN: 45 mL/min — AB (ref >60)
GFR, EST NON AFRICAN AMERICAN: 39 mL/min — AB (ref >60)
Glucose, Bld: 129 mg/dL — ABNORMAL HIGH (ref 65–99)
POTASSIUM: 4.4 mmol/L (ref 3.5–5.1)
SODIUM: 139 mmol/L (ref 135–145)
Total Bilirubin: 0.2 mg/dL — ABNORMAL LOW (ref 0.3–1.2)
Total Protein: 5.9 g/dL — ABNORMAL LOW (ref 6.5–8.1)

## 2015-04-26 LAB — I-STAT TROPONIN, ED: TROPONIN I, POC: 0.01 ng/mL (ref 0.00–0.08)

## 2015-04-26 LAB — PROTIME-INR
INR: 1.09 (ref 0.00–1.49)
Prothrombin Time: 14.3 seconds (ref 11.6–15.2)

## 2015-04-26 LAB — CBG MONITORING, ED: Glucose-Capillary: 109 mg/dL — ABNORMAL HIGH (ref 65–99)

## 2015-04-26 LAB — I-STAT CG4 LACTIC ACID, ED: Lactic Acid, Venous: 1.09 mmol/L (ref 0.5–2.0)

## 2015-04-26 NOTE — ED Provider Notes (Signed)
CSN: [id number]     Arrival date & time 04/26/15  2113 History   First MD Initiated Contact with Patient 04/26/15 2115     Chief Complaint  Patient presents with  . Altered Mental Status     Level 5 caveat due to altered mental status. Patient is a 81 y.o. male presenting with altered mental status.  Altered Mental Status  patient had stated that he didn't feel good and later found unresponsive by his wife. History of ventricular tachycardia and ischemic cardiomyopathy. Has had 2 different cabbages and has known disease. No chest pain. No trouble breathing. Reportedly had some mild bradycardia for EMS and was hypotensive. No fevers. Initially is confused and somewhat slow to answer but does know his name and where he is at. Has difficulty recognizing his wife.  Past Medical History  Diagnosis Date  . Hyperlipidemia   . Hypertension   . Carotid artery occlusion     a. s/p L CEA in 2010;  b. 06/2014 Carotid U/S: stable LICA CEA site w/o significant stenosis bilat.  . Crohn's disease (HCC)     a. s/p colon resection  . Bradycardia     a. previous intolerance to beta blockers - low dose toprol started 10/2011 for tachycardia but had rash and was discontinued;  b. tolerating low dose propranolol.  . Chronic systolic heart failure (HCC)     a. 02/2015 Echo:  35-40% (echo 11/2010); c. 02/2015 Echo: EF 20-25%, diff HK, mid-apicalanteroseptal and apical AK, Gr 1 DD, mild MR.  . Ischemic cardiomyopathy     a. EF 34% (Lexiscan 06/2011); b. 35-40% (echo 11/2010); c. 02/2015 Echo: EF 20-25%, diff HK, mid-apicalanteroseptal and apical AK, Gr 1 DD, mild MR.  . Coronary artery disease     a. 1969 s/p MI;  b. S/P CABG 1987;  c. S/P redo 2008;  d. 07/2012 Cath: LM 95, LAD 100p, 95apical after graft insertion, D1 70, LCX 100p, VG->OM1 60d, VG->mLAD from VG->OM (Y graft) nl, VG->OM2->LPDA patent, LIMA->LAD known atresis, EF 40%; e. 02/2014 MV: anterior and posterolateral/apical infarct with mild peri-infarct  ischemia->Med Rx.  . Peripheral vascular disease (HCC)     a. Carotid dz s/p L CEA, RAS, AAA, LE dz  . AAA (abdominal aortic aneurysm) (HCC)     a. Ectatic abdominal aorta with fusiform dilitation 3.4x3.4 and moderate thrombus burden 12/2010  . RBBB   . Tachycardia     a. 10/2011 - atrial tach vs avnrt - BB started.  [name] COPD (chronic obstructive pulmonary disease) (HCC)   . Ventricular tachycardia (HCC)     a. 02/2015 ->amio 200 daily added.  . Gout   . Renal artery stenosis (HCC)     a. Dopplers 12/2010 - 1-59% R ostial renal artery stenosis, 2 L renal arteries both widely patent   Past Surgical History  Procedure Laterality Date  . Carotid endarterectomy Left 2010  . Colon resection  2008  . Tonsillectomy      as a child  . Pr vein bypass graft,aorto-fem-pop  10/1985  . Femoral endarterectomy Left  06/2011    ileofemoral endarterectomy with bovine patch angioplasty  . Endarterectomy  08/26/2011    Procedure: ENDARTERECTOMY ILIAC;  Surgeon: [name], MD;  Location: [facility] OR;  Service: Vascular;  Laterality: Right;  Right Femoral Artery Endarterectomy with vascu guard patch angioplasty & intraoperative arteriogram.  . Cardiac catheterization  07/28/2012    Native 3v CAD, continued patency of the SVG-OM1-LAD and SVG-OM2-left PDA. LVEF 40%  with multiple WMAs  . Abdominal aortagram N/A 06/11/2011    Procedure: ABDOMINAL [name];  Surgeon: [name], MD;  Location: [facility] CATH LAB;  Service: Cardiovascular;  Laterality: N/A;  . Left heart catheterization with coronary angiogram N/A 07/28/2012    Procedure: LEFT HEART CATHETERIZATION WITH CORONARY ANGIOGRAM;  Surgeon: [name], MD;  Location: [facility] CATH LAB;  Service: Cardiovascular;  Laterality: N/A;  . Colon surgery    . Cataract extraction, bilateral    . Coronary artery bypass graft  10/31/85 & 08/19/06  . Coronary angioplasty    . Cardiac catheterization N/A 03/08/2015    Procedure: Left Heart Cath and Coronary Angiography;  Surgeon:  [name], MD;  Location: [facility] INVASIVE CV LAB;  Service: Cardiovascular;  Laterality: N/A;   Family History  Problem Relation Age of Onset  . Heart disease Father   . Cancer Mother   . COPD Mother   . Anesthesia problems Neg Hx   . Deep vein thrombosis Brother   . COPD Brother   . CAD Brother    Social History  Substance Use Topics  . Smoking status: Never Smoker   . Smokeless tobacco: Never Used  . Alcohol Use: No    Review of Systems  Unable to perform ROS: Mental status change  Neurological: Positive for syncope.      Allergies  Novocain and Metoprolol  Home Medications   Prior to Admission medications   Medication Sig Start Date End Date Taking? Authorizing Provider  acetaminophen (TYLENOL) 500 MG tablet Take 1,000 mg by mouth every 6 (six) hours as needed for pain.   Yes Historical Provider, MD  aspirin 81 MG tablet Take 81 mg by mouth daily.    Yes Historical Provider, MD  atorvastatin (LIPITOR) 10 MG tablet Take 10 mg by mouth at bedtime.    Yes Historical Provider, MD  benzonatate (TESSALON) 100 MG capsule Take 100 mg by mouth 3 (three) times daily as needed for cough.  04/15/15  Yes Historical Provider, MD  clopidogrel (PLAVIX) 75 MG tablet Take 75 mg by mouth daily.  01/09/14  Yes Historical Provider, MD  COLCRYS 0.6 MG tablet Take 0.6 mg by mouth daily as needed (gout).  04/17/11  Yes Historical Provider, MD  ipratropium (ATROVENT) 0.03 % nasal spray Place 1 spray into the nose daily as needed.    Yes Historical Provider, MD  isosorbide mononitrate (IMDUR) 120 MG 24 hr tablet TAKE ONE TABLET BY MOUTH ONCE DAILY. 03/04/15  Yes [name], MD  mexiletine (MEXITIL) 200 MG capsule Take 1 capsule (200 mg total) by mouth every 12 (twelve) hours. 03/13/15  Yes Renee Norberto Sorenson, PA-C  Multiple Vitamins-Minerals (MULTIVITAMIN PO) Take 2 tablets by mouth daily.    Yes Historical Provider, MD  nitroGLYCERIN (NITROSTAT) 0.4 MG SL tablet Place 0.4 mg under the tongue  every 5 (five) minutes as needed for chest pain.   Yes Historical Provider, MD  OVER THE COUNTER MEDICATION 1 scoop. Barley life otc medication by aim.   Yes Historical Provider, MD  pantoprazole (PROTONIX) 40 MG tablet TAKE ONE TABLET BY MOUTH ONCE DAILY 04/09/15  Yes [name], MD  ranolazine (RANEXA) 1000 MG SR tablet Take 1 tablet (1,000 mg total) by mouth 2 (two) times daily. 03/13/15  Yes Renee Norberto Sorenson, PA-C   BP 161/81 mmHg  Pulse 58  Temp(Src) 96.5 F (35.8 C) (Rectal)  Resp 17  SpO2 96% Physical Exam  Constitutional: He appears well-developed.  HENT:  Head: Atraumatic.  Eyes: Pupils are equal, round, and reactive to light.  Cardiovascular:  Mild bradycardia  Pulmonary/Chest: Effort normal.  Abdominal: There is no tenderness.  Musculoskeletal: Normal range of motion.  Neurological:  Initially somewhat somnolent but will arouse. Slow to answer. Repeatedly asks his wife why she is at the hospital. Mental status later improved and he is able answer more questions.  Skin: Skin is warm.    ED Course  Procedures (including critical care time) Labs Review Labs Reviewed  COMPREHENSIVE METABOLIC PANEL - Abnormal; Notable for the following:    Glucose, Bld 129 (*)    BUN 26 (*)    Creatinine, Ser 1.54 (*)    Calcium 8.6 (*)    Total Protein 5.9 (*)    Albumin 3.4 (*)    Total Bilirubin 0.2 (*)    GFR calc non Af Amer 39 (*)    GFR calc Af Amer 45 (*)    All other components within normal limits  CBC - Abnormal; Notable for the following:    RBC 3.30 (*)    Hemoglobin 11.2 (*)    HCT 33.8 (*)    MCV 102.4 (*)    All other components within normal limits  CBG MONITORING, ED - Abnormal; Notable for the following:    Glucose-Capillary 109 (*)    All other components within normal limits  I-STAT CHEM 8, ED - Abnormal; Notable for the following:    BUN 28 (*)    Creatinine, Ser 1.50 (*)    Glucose, Bld 119 (*)    Hemoglobin 11.6 (*)    HCT 34.0 (*)    All other  components within normal limits  PROTIME-INR  URINALYSIS, ROUTINE W REFLEX MICROSCOPIC (NOT AT [facility])  I-STAT CG4 LACTIC ACID, ED  I-STAT TROPOININ, ED    Imaging Review Ct Head Wo Contrast  04/26/2015  CLINICAL DATA:  Altered mental status. EXAM: CT HEAD WITHOUT CONTRAST TECHNIQUE: Contiguous axial images were obtained from the base of the skull through the vertex without intravenous contrast. COMPARISON:  None. FINDINGS: Generalized atrophy, cerebellar greater than cerebral. Mild chronic small vessel ischemia. Small lacunar infarct in left basal ganglia. No intracranial hemorrhage, mass effect, or midline shift. No hydrocephalus. The basilar cisterns are patent. No evidence of territorial infarct. No intracranial fluid collection. Calvarium is intact. Minimal mucosal thickening of maxillary sinuses and ethmoid air cells. Diffuse opacification of right mastoid air cells, left mastoid air cells are clear. IMPRESSION: 1. Generalized atrophy with mild chronic small vessel ischemia. Small lacunar infarcts in the left basal ganglia, likely remote but age indeterminate. 2. No acute intracranial abnormality. 3. Mild paranasal sinus inflammatory change. Opacification of right mastoid air cells. Electronically Signed   By: Rubye Oaks M.D.   On: 04/26/2015 23:16   Dg Chest Portable 1 View  04/26/2015  CLINICAL DATA:  Altered mental status. EXAM: PORTABLE CHEST 1 VIEW COMPARISON:  03/05/2015 FINDINGS: Postoperative changes in the mediastinum. Shallow inspiration with elevation of the left hemidiaphragm. Normal heart size and pulmonary vascularity. Atelectasis in the lung bases. No focal consolidation. No blunting of costophrenic angles. No pneumothorax. Calcified and tortuous aorta. Degenerative changes in the spine. IMPRESSION: Shallow inspiration with atelectasis in the lung bases. Electronically Signed   By: Burman Nieves M.D.   On: 04/26/2015 23:47   I have personally reviewed and evaluated these  images and lab results as part of my medical decision-making.   EKG Interpretation   Date/Time:  Friday April 26 2015 21:19:19 EST Ventricular  Rate:  57 PR Interval:  250 QRS Duration: 197 QT Interval:  555 QTC Calculation: 540 R Axis:   -27 Text Interpretation:  Sinus rhythm Prolonged PR interval IVCD, consider  atypical RBBB No significant change since last tracing Confirmed by  Rubin Payor  MD, Harrold Donath [phone]) on 04/26/2015 9:30:58 PM      MDM   Final diagnoses:  Syncope, unspecified syncope type    Patient presents with altered mental status. Has improved while in the ER. With patient's history it is concerning for an arrhythmia as the cause of the altered mental status. EMS did not catch it however. Mental status is improving. Was acute onset. Has been discussion of a possible ASD in the past. Will admit to internal medicine and will likely need cardiology consult.    Benjiman Core, MD 04/26/15 2352

## 2015-04-26 NOTE — ED Notes (Signed)
Wife is here and and states that pt has a living will and has DNR

## 2015-04-26 NOTE — ED Notes (Signed)
Pt from home with ems. Pt was sitting on the couch with his wife and the pt stated "I don't feel good" pt then became responsive, pale and diaphoretic. Upon EMS arrivfal pt was altered with slow respirations, HR 50 and BP initially 90/54. Pt was given bolus and BP up to 138/80 and HR up to 60. 12 lead showed LBBB. Upon arrival to the ED pt is alert and self but lethargic.

## 2015-04-27 ENCOUNTER — Encounter (HOSPITAL_COMMUNITY): Payer: Self-pay | Admitting: Internal Medicine

## 2015-04-27 DIAGNOSIS — N183 Chronic kidney disease, stage 3 (moderate): Secondary | ICD-10-CM

## 2015-04-27 DIAGNOSIS — I2584 Coronary atherosclerosis due to calcified coronary lesion: Secondary | ICD-10-CM

## 2015-04-27 DIAGNOSIS — I10 Essential (primary) hypertension: Secondary | ICD-10-CM

## 2015-04-27 DIAGNOSIS — I255 Ischemic cardiomyopathy: Secondary | ICD-10-CM | POA: Diagnosis not present

## 2015-04-27 DIAGNOSIS — R55 Syncope and collapse: Secondary | ICD-10-CM | POA: Diagnosis present

## 2015-04-27 DIAGNOSIS — I2510 Atherosclerotic heart disease of native coronary artery without angina pectoris: Secondary | ICD-10-CM

## 2015-04-27 DIAGNOSIS — I472 Ventricular tachycardia: Secondary | ICD-10-CM

## 2015-04-27 LAB — URINE MICROSCOPIC-ADD ON

## 2015-04-27 LAB — URINALYSIS, ROUTINE W REFLEX MICROSCOPIC
Bilirubin Urine: NEGATIVE
Glucose, UA: NEGATIVE mg/dL
Hgb urine dipstick: NEGATIVE
KETONES UR: NEGATIVE mg/dL
NITRITE: NEGATIVE
PH: 5 (ref 5.0–8.0)
PROTEIN: NEGATIVE mg/dL
Specific Gravity, Urine: 1.024 (ref 1.005–1.030)

## 2015-04-27 LAB — BASIC METABOLIC PANEL
ANION GAP: 7 (ref 5–15)
BUN: 24 mg/dL — ABNORMAL HIGH (ref 6–20)
CHLORIDE: 106 mmol/L (ref 101–111)
CO2: 27 mmol/L (ref 22–32)
Calcium: 8.6 mg/dL — ABNORMAL LOW (ref 8.9–10.3)
Creatinine, Ser: 1.5 mg/dL — ABNORMAL HIGH (ref 0.61–1.24)
GFR calc non Af Amer: 40 mL/min — ABNORMAL LOW (ref >60)
GFR, EST AFRICAN AMERICAN: 47 mL/min — AB (ref >60)
Glucose, Bld: 123 mg/dL — ABNORMAL HIGH (ref 65–99)
Potassium: 4.2 mmol/L (ref 3.5–5.1)
Sodium: 140 mmol/L (ref 135–145)

## 2015-04-27 LAB — TROPONIN I
Troponin I: <0.03 ng/mL (ref <0.031)
Troponin I: 0.03 ng/mL (ref <0.031)

## 2015-04-27 MED ORDER — PANTOPRAZOLE SODIUM 40 MG PO TBEC
40.0000 mg | DELAYED_RELEASE_TABLET | Freq: Every day | ORAL | Status: DC
  Filled 2015-04-27: qty 1

## 2015-04-27 MED ORDER — ASPIRIN 81 MG PO CHEW
81.0000 mg | CHEWABLE_TABLET | Freq: Every day | ORAL | Status: DC
  Filled 2015-04-27 (×2): qty 1

## 2015-04-27 MED ORDER — SODIUM CHLORIDE 0.9 % IJ SOLN
3.0000 mL | Freq: Two times a day (BID) | INTRAMUSCULAR | Status: DC
  Administered 2015-04-27: 3 mL via INTRAVENOUS

## 2015-04-27 MED ORDER — ATORVASTATIN CALCIUM 10 MG PO TABS: 10.0000 mg | ORAL_TABLET | Freq: Every day | ORAL | Status: DC

## 2015-04-27 MED ORDER — ACETAMINOPHEN 325 MG PO TABS: 650.0000 mg | ORAL_TABLET | Freq: Four times a day (QID) | ORAL | Status: DC | PRN

## 2015-04-27 MED ORDER — ISOSORBIDE MONONITRATE ER 60 MG PO TB24
120.0000 mg | ORAL_TABLET | Freq: Every day | ORAL | Status: DC
  Filled 2015-04-27 (×2): qty 2

## 2015-04-27 MED ORDER — CLOPIDOGREL BISULFATE 75 MG PO TABS
75.0000 mg | ORAL_TABLET | Freq: Every day | ORAL | Status: DC
  Filled 2015-04-27 (×2): qty 1

## 2015-04-27 MED ORDER — FAMOTIDINE 20 MG PO TABS
20.0000 mg | ORAL_TABLET | Freq: Two times a day (BID) | ORAL | Status: DC
  Administered 2015-04-27: 20 mg via ORAL
  Filled 2015-04-27 (×2): qty 1

## 2015-04-27 MED ORDER — COLCHICINE 0.6 MG PO TABS: 0.6000 mg | ORAL_TABLET | Freq: Every day | ORAL | Status: DC | PRN

## 2015-04-27 MED ORDER — MEXILETINE HCL 200 MG PO CAPS
200.0000 mg | ORAL_CAPSULE | Freq: Two times a day (BID) | ORAL | Status: DC
  Filled 2015-04-27 (×2): qty 1

## 2015-04-27 MED ORDER — IPRATROPIUM BROMIDE 0.03 % NA SOLN
1.0000 | Freq: Every day | NASAL | Status: DC | PRN
  Filled 2015-04-27: qty 30

## 2015-04-27 MED ORDER — ENOXAPARIN SODIUM 30 MG/0.3ML ~~LOC~~ SOLN
30.0000 mg | SUBCUTANEOUS | Status: DC
  Filled 2015-04-27: qty 0.3

## 2015-04-27 MED ORDER — NITROGLYCERIN 0.4 MG SL SUBL
0.4000 mg | SUBLINGUAL_TABLET | SUBLINGUAL | Status: DC | PRN
Start: 2015-04-27 — End: 2015-04-27

## 2015-04-27 MED ORDER — RANOLAZINE ER 500 MG PO TB12
1000.0000 mg | ORAL_TABLET | Freq: Two times a day (BID) | ORAL | Status: DC
  Filled 2015-04-27: qty 2

## 2015-04-27 NOTE — H&P (Signed)
[facility] History and Physical  Karl Stevenson ZOX:[id number] DOB: [birth year] DOA: 04/26/2015  Referring physician: Benjiman Core, M.D. PCP: Garlan Fillers, MD   Chief Complaint: Altered mental status.  HPI: Karl Stevenson is a 81 y.o. male  with a past medical history of hyperlipidemia, hypertension, coronary artery occlusion, status post CABG 2, Crohn's disease, chronic systolic CHF with an EF of 20-25%, ischemic cardiomyopathy, ventricular tachycardia on 10 2016, peripheral vascular disease, COPD who comes to the emergency department via EMS after having altered mental status at home and being found unresponsive by his wife. The patient has stated earlier in the evening after dinner that he did not feel good. He has had a good day until that point. He says that he was trying to get to the bathroom, without using his walker when he felt weak on his legs and lightheaded. He denies chest pain, palpitations, diaphoresis or dyspnea during this episode.  He states that he remembers the paramedics being at home, but he does not remember when they arrived there. The patient was initially altered and confused, but is now back to baseline.  Review of Systems:  Constitutional:  Positive fatigue.  No weight loss, night sweats, Fevers, chills,  HEENT:  No headaches, Difficulty swallowing,Tooth/dental problems,Sore throat,  No sneezing, itching, ear ache, nasal congestion, post nasal drip,  Cardio-vascular:  As above mentioned. GI:  No heartburn, indigestion, abdominal pain, nausea, vomiting, diarrhea, change in bowel habits, loss of appetite  Resp:  No shortness of breath with exertion or at rest. No excess mucus, no productive cough, No non-productive cough, No coughing up of blood.No change in color of mucus.No wheezing.No chest wall deformity  Skin:  no rash or lesions.  GU:  Difficulty initiating miction no dysuria, change in color of urine, no urgency or frequency. No  flank pain.  Musculoskeletal:  Occasional arthralgias. Psych:  No change in mood or affect. No depression or anxiety. No memory loss.   Past Medical History  Diagnosis Date  . Hyperlipidemia   . Hypertension   . Carotid artery occlusion     a. s/p L CEA in 2010;  b. 06/2014 Carotid U/S: stable LICA CEA site w/o significant stenosis bilat.  . Crohn's disease (HCC)     a. s/p colon resection  . Bradycardia     a. previous intolerance to beta blockers - low dose toprol started 10/2011 for tachycardia but had rash and was discontinued;  b. tolerating low dose propranolol.  . Chronic systolic heart failure (HCC)     a. 02/2015 Echo:  35-40% (echo 11/2010); c. 02/2015 Echo: EF 20-25%, diff HK, mid-apicalanteroseptal and apical AK, Gr 1 DD, mild MR.  . Ischemic cardiomyopathy     a. EF 34% (Lexiscan 06/2011); b. 35-40% (echo 11/2010); c. 02/2015 Echo: EF 20-25%, diff HK, mid-apicalanteroseptal and apical AK, Gr 1 DD, mild MR.  . Coronary artery disease     a. 1969 s/p MI;  b. S/P CABG 1987;  c. S/P redo 2008;  d. 07/2012 Cath: LM 95, LAD 100p, 95apical after graft insertion, D1 70, LCX 100p, VG->OM1 60d, VG->mLAD from VG->OM (Y graft) nl, VG->OM2->LPDA patent, LIMA->LAD known atresis, EF 40%; e. 02/2014 MV: anterior and posterolateral/apical infarct with mild peri-infarct ischemia->Med Rx.  . Peripheral vascular disease (HCC)     a. Carotid dz s/p L CEA, RAS, AAA, LE dz  . AAA (abdominal aortic aneurysm) (HCC)     a. Ectatic abdominal aorta with fusiform dilitation 3.4x3.4 and  moderate thrombus burden 12/2010  . RBBB   . Tachycardia     a. 10/2011 - atrial tach vs avnrt - BB started.  [name] COPD (chronic obstructive pulmonary disease) (HCC)   . Ventricular tachycardia (HCC)     a. 02/2015 ->amio 200 daily added.  . Gout   . Renal artery stenosis (HCC)     a. Dopplers 12/2010 - 1-59% R ostial renal artery stenosis, 2 L renal arteries both widely patent   Past Surgical History  Procedure Laterality  Date  . Carotid endarterectomy Left 2010  . Colon resection  2008  . Tonsillectomy      as a child  . Pr vein bypass graft,aorto-fem-pop  10/1985  . Femoral endarterectomy Left  06/2011    ileofemoral endarterectomy with bovine patch angioplasty  . Endarterectomy  08/26/2011    Procedure: ENDARTERECTOMY ILIAC;  Surgeon: [name], MD;  Location: [facility] OR;  Service: Vascular;  Laterality: Right;  Right Femoral Artery Endarterectomy with vascu guard patch angioplasty & intraoperative arteriogram.  . Cardiac catheterization  07/28/2012    Native 3v CAD, continued patency of the SVG-OM1-LAD and SVG-OM2-left PDA. LVEF 40% with multiple WMAs  . Abdominal aortagram N/A 06/11/2011    Procedure: ABDOMINAL [name];  Surgeon: [name], MD;  Location: [facility] CATH LAB;  Service: Cardiovascular;  Laterality: N/A;  . Left heart catheterization with coronary angiogram N/A 07/28/2012    Procedure: LEFT HEART CATHETERIZATION WITH CORONARY ANGIOGRAM;  Surgeon: [name], MD;  Location: [facility] CATH LAB;  Service: Cardiovascular;  Laterality: N/A;  . Colon surgery    . Cataract extraction, bilateral    . Coronary artery bypass graft  10/31/85 & 08/19/06  . Coronary angioplasty    . Cardiac catheterization N/A 03/08/2015    Procedure: Left Heart Cath and Coronary Angiography;  Surgeon: [name], MD;  Location: [facility] INVASIVE CV LAB;  Service: Cardiovascular;  Laterality: N/A;   Social History:  reports that he has never smoked. He has never used smokeless tobacco. He reports that he does not drink alcohol or use illicit drugs.  Allergies  Allergen Reactions  . Novocain [Procaine Hcl] Other (See Comments)    Cold sweats and very bad headache.   . Metoprolol Hives, Itching and Rash    Family History  Problem Relation Age of Onset  . Heart disease Father   . Cancer Mother   . COPD Mother   . Anesthesia problems Neg Hx   . Deep vein thrombosis Brother   . COPD Brother   . CAD Brother      Prior to  Admission medications   Medication Sig Start Date End Date Taking? Authorizing Provider  acetaminophen (TYLENOL) 500 MG tablet Take 1,000 mg by mouth every 6 (six) hours as needed for pain.   Yes Historical Provider, MD  aspirin 81 MG tablet Take 81 mg by mouth daily.    Yes Historical Provider, MD  atorvastatin (LIPITOR) 10 MG tablet Take 10 mg by mouth at bedtime.    Yes Historical Provider, MD  benzonatate (TESSALON) 100 MG capsule Take 100 mg by mouth 3 (three) times daily as needed for cough.  04/15/15  Yes Historical Provider, MD  clopidogrel (PLAVIX) 75 MG tablet Take 75 mg by mouth daily.  01/09/14  Yes Historical Provider, MD  COLCRYS 0.6 MG tablet Take 0.6 mg by mouth daily as needed (gout).  04/17/11  Yes Historical Provider, MD  ipratropium (ATROVENT) 0.03 % nasal spray Place 1 spray into  the nose daily as needed.    Yes Historical Provider, MD  isosorbide mononitrate (IMDUR) 120 MG 24 hr tablet TAKE ONE TABLET BY MOUTH ONCE DAILY. 03/04/15  Yes [name], MD  mexiletine (MEXITIL) 200 MG capsule Take 1 capsule (200 mg total) by mouth every 12 (twelve) hours. 03/13/15  Yes Renee Norberto Sorenson, PA-C  Multiple Vitamins-Minerals (MULTIVITAMIN PO) Take 2 tablets by mouth daily.    Yes Historical Provider, MD  nitroGLYCERIN (NITROSTAT) 0.4 MG SL tablet Place 0.4 mg under the tongue every 5 (five) minutes as needed for chest pain.   Yes Historical Provider, MD  OVER THE COUNTER MEDICATION 1 scoop. Barley life otc medication by aim.   Yes Historical Provider, MD  pantoprazole (PROTONIX) 40 MG tablet TAKE ONE TABLET BY MOUTH ONCE DAILY 04/09/15  Yes [name], MD  ranolazine (RANEXA) 1000 MG SR tablet Take 1 tablet (1,000 mg total) by mouth 2 (two) times daily. 03/13/15  Yes Renee Norberto Sorenson, PA-C   Physical Exam: Filed Vitals:   04/27/15 0115 04/27/15 0130 04/27/15 0145 04/27/15 0200  BP: 130/90 143/65 119/53 113/62  Pulse: 64 68 62 60  Temp:      TempSrc:      Resp: SpO2:  100% 100% 96% 96%    Wt Readings from Last 3 Encounters:  04/16/15 61.598 kg (135 lb 12.8 oz)  04/02/15 60.836 kg (134 lb 1.9 oz)  03/13/15 58.877 kg (129 lb 12.8 oz)    General:  Appears calm and comfortable Eyes: PERRL, normal lids, irises & conjunctiva ENT: grossly normal hearing, lips & tongue Neck: no LAD, masses or thyromegaly Cardiovascular: RRR with frequent extrasystoles, no m/r/g. No LE edema. Telemetry: Frequent multiple PVCs Respiratory: CTA bilaterally, no w/r/r. Normal respiratory effort. Abdomen: soft, ntnd Skin: no rash or induration seen on limited exam Musculoskeletal: grossly normal tone BUE/BLE Psychiatric:  grossly normal mood and affect, speech fluent and appropriate Neurologic: Awake alert oriented 3,Generalized weakness, but grossly non-focal.          Labs on Admission:  Basic Metabolic Panel:  Recent Labs Lab 04/26/15 2152 04/26/15 2210  NA 139 142  K 4.4 4.3  CL 108 106  CO2 22  --   GLUCOSE 129* 119*  BUN 26* 28*  CREATININE 1.54* 1.50*  CALCIUM 8.6*  --    Liver Function Tests:  Recent Labs Lab 04/26/15 2152  AST 21  ALT 17  ALKPHOS 64  BILITOT 0.2*  PROT 5.9*  ALBUMIN 3.4*   CBC:  Recent Labs Lab 04/26/15 2152 04/26/15 2210  WBC 6.7  --   HGB 11.2* 11.6*  HCT 33.8* 34.0*  MCV 102.4*  --   PLT 161  --     CBG:  Recent Labs Lab 04/26/15 2201  GLUCAP 109*    Radiological Exams on Admission: Ct Head Wo Contrast  04/26/2015  CLINICAL DATA:  Altered mental status. EXAM: CT HEAD WITHOUT CONTRAST TECHNIQUE: Contiguous axial images were obtained from the base of the skull through the vertex without intravenous contrast. COMPARISON:  None. FINDINGS: Generalized atrophy, cerebellar greater than cerebral. Mild chronic small vessel ischemia. Small lacunar infarct in left basal ganglia. No intracranial hemorrhage, mass effect, or midline shift. No hydrocephalus. The basilar cisterns are patent. No evidence of territorial  infarct. No intracranial fluid collection. Calvarium is intact. Minimal mucosal thickening of maxillary sinuses and ethmoid air cells. Diffuse opacification of right mastoid air cells, left mastoid air cells are clear. IMPRESSION: 1.  Generalized atrophy with mild chronic small vessel ischemia. Small lacunar infarcts in the left basal ganglia, likely remote but age indeterminate. 2. No acute intracranial abnormality. 3. Mild paranasal sinus inflammatory change. Opacification of right mastoid air cells. Electronically Signed   By: Rubye Oaks M.D.   On: 04/26/2015 23:16   Dg Chest Portable 1 View  04/26/2015  CLINICAL DATA:  Altered mental status. EXAM: PORTABLE CHEST 1 VIEW COMPARISON:  03/05/2015 FINDINGS: Postoperative changes in the mediastinum. Shallow inspiration with elevation of the left hemidiaphragm. Normal heart size and pulmonary vascularity. Atelectasis in the lung bases. No focal consolidation. No blunting of costophrenic angles. No pneumothorax. Calcified and tortuous aorta. Degenerative changes in the spine. IMPRESSION: Shallow inspiration with atelectasis in the lung bases. Electronically Signed   By: Burman Nieves M.D.   On: 04/26/2015 23:47  . Echocardiogram 03/06/2015 ------------------------------------------------------------------- LV EF: 20% -  25%  ------------------------------------------------------------------- Indications:   Ventricular tachycardia 427.1&quot;,.  ------------------------------------------------------------------- History:  PMH:  Coronary artery disease. Chronic obstructive pulmonary disease. PMH:  Myocardial infarction. Risk factors: Hypertension. Dyslipidemia.  ------------------------------------------------------------------- Study Conclusions  - Left ventricle: The cavity size was normal. Systolic function was severely reduced. The estimated ejection fraction was in the range of 20% to 25%. Diffuse hypokinesis. There is  akinesis of the mid-apicalanteroseptal and apical myocardium. Doppler parameters are consistent with abnormal left ventricular relaxation (grade 1 diastolic dysfunction). - Mitral valve: Calcified annulus. There was mild regurgitation.  Impressions:  - When compared to prior echocardiogram, EF is reduced (prior 35%).     Left Heart Cath and Coronary Angiography 03/08/2015    Conclusion     Sequential SVG-OM2-OM3 was injected is large, and is anatomically normal.  Y Graft SVG-LAD from SVG-OM is large, and is anatomically normal. The downstream LAD is small caliber and diffusely diseased.  LM lesion, 100% stenosed.  Ost LAD to Prox LAD lesion, 100% stenosed.  Ost 2nd Mrg to 2nd Mrg lesion, 100% stenosed.  SVG was injected is small. JR4 Catheter  Prox Original SVG-OM1 100% stenosed.  Ost Cx to Mid Cx lesion, 100% stenosed.  Borderline LOW LVEDP   Impression:  Known severe native coronary disease. Disabled contrast the native arteries were not evaluated.  Occluded old SVG-OM1 - the dye remained standing in the proximal vessel for several minutes following angiography - not PCI amenable  This is the likely culprit for the patient's episode of what sounds like anginal symptoms about a month ago. This may also be a cause of now worsening ventricular tachycardia.   Widely patent Y graft with one limb providing sequential grafting to OM1 and OM 2 as well as a separate limb serving as a graft to the LAD which retrograde fills the diagonal branch. This redo graft now provides the entirety of the patient's coronary circulation.  The graft obtuse marginal branches are relatively free of disease with retrograde filling back to the distal circumflex system which consists of the posterolateral branch and left PDA.  The grafted LAD itself was a very diffusely diseased vessel, but no change from previous.  Known small nondominant right coronary artery system.  Borderline  low LVEDP   Recommendations:  No PCI targets.  Standard post radial cath care with TR band removal  Would proceed with ICD placement per EP     EKG: Independently reviewed. Vent. rate 57 BPM PR interval 250 ms QRS duration 197 ms QT/QTc 555/540 ms P-R-T axes 59 -27 89 Sinus rhythm Prolonged PR interval IVCD, consider atypical RBBB  Assessment/Plan Principal Problem:   Syncope  History of NSVT (nonsustained ventricular tachycardia) (HCC) Admit to telemetry. Trend troponin levels. Patient advised to rise very slowly from bed or seats as suggested by cardiology in the past. Advised to use his walker when needed.  Active Problems:   CAD (coronary artery disease)   Ischemic cardiomyopathy   Peripheral vascular disease, unspecified (HCC)   Carotid artery disease Continue current antiplatelet agents multi-therapy. Continue Imdur 120 mg every 24 hours Given the extension of atherosclerosis, long-term prognosis is poor.      CKD (chronic kidney disease), stage III Monitor BUN and creatinine.      HTN (hypertension) Monitor blood pressure.   Code Status: Full code. The patient's wife stated that he has a living will and a DO NOT RESUSCITATE document. However, when I asked the patient, he wanted to be a full code. He has been a full code in all of his previous admissions.  DVT Prophylaxis: Mechanical with SCDs. Family Communication:  Disposition Plan: Admit to telemetry and trend troponin levels.  Time spent: Over 70 minutes were spent in the process of this admission.  Bobette Mo [facility] Pager [phone].

## 2015-04-27 NOTE — Discharge Summary (Signed)
Physician Discharge Summary  Karl Stevenson ZOX:[id number] DOB: 02-13-1927 DOA: 04/26/2015  PCP: Garlan Fillers, MD  Admit date: 04/26/2015 Discharge date: 04/27/2015  Time spent: < 30 minutes  Recommendations for Outpatient Follow-up:  1. Follow up with PCP in 1-2 weeks 2. Follow up with Cardiology as needed  Discharge Diagnoses:  Principal Problem:   Syncope Active Problems:   CAD (coronary artery disease)   HTN (hypertension)   Ischemic cardiomyopathy   Peripheral vascular disease, unspecified (HCC)   CKD (chronic kidney disease), stage III   NSVT (nonsustained ventricular tachycardia) (HCC)  Discharge Condition: stable  Diet recommendation: heart healthy  History of present illness:  See H&P, Labs, Consult and Test reports for all details in brief, patient is a 81 y.o. male with a past medical history of hyperlipidemia, hypertension, coronary artery occlusion, status post CABG 2, Crohn's disease, chronic systolic CHF with an EF of 20-25%, ischemic cardiomyopathy, ventricular tachycardia on 10 2016, peripheral vascular disease, COPD admitted with pre-syncope and malaise.  Hospital Course:  Patient was admitted to the hospital on telemetry floor, given arrhythmia history and recurrent prior VT episodes cardiology was consulted and have evaluated patient while hospitalized. On admission he was felt to be somewhat dehydrated and received IV fluids with symptomatic improvement. On the day of discharge, patient was feeling at baseline without further problems, I discussed with EP and this does not seems to have been an event related to cardiac arrhythmias, and given rapid improvement with IVF appears to have been due to dehydration. Among differential is a vagal response since the episode happened after patient used the restroom. His renal function has remained stable and at baseline for him, and his cardiac enzymes have remained negative. He was discharged home in stable  condition and is to follow up with PCP and cardiology as an outpatient.   Procedures:  None    Consultations:  Cardiology - EP  Discharge Exam: Filed Vitals:   04/27/15 0145 04/27/15 0200 04/27/15 0302 04/27/15 1237  BP: 119/53 113/62 126/74 146/78  Pulse: 62 60 68 78  Temp:   97.9 F (36.6 C) 97.5 F (36.4 C)  TempSrc:   Oral Oral  Resp: Height:    (1.702 m)   SpO2: 96% 96% 96% 98%    General: NAD Cardiovascular: RRR Respiratory: CTA biL  Discharge Instructions Activity:  As tolerated   Get Medicines reviewed and adjusted: Please take all your medications with you for your next visit with your Primary MD  Please request your Primary MD to go over all hospital tests and procedure/radiological results at the follow up, please ask your Primary MD to get all Hospital records sent to his/her office.  If you experience worsening of your admission symptoms, develop shortness of breath, life threatening emergency, suicidal or homicidal thoughts you must seek medical attention immediately by calling 911 or calling your MD immediately if symptoms less severe.  You must read complete instructions/literature along with all the possible adverse reactions/side effects for all the Medicines you take and that have been prescribed to you. Take any new Medicines after you have completely understood and accpet all the possible adverse reactions/side effects.   Do not drive when taking Pain medications.   Do not take more than prescribed Pain, Sleep and Anxiety Medications  Special Instructions: If you have smoked or chewed Tobacco in the last 2 yrs please stop smoking, stop any regular Alcohol and or any Recreational drug use.  Wear Seat belts while driving.  Please note  You were cared for by a hospitalist during your hospital stay. Once you are discharged, your primary care physician will handle any further medical issues. Please note that NO REFILLS for any  discharge medications will be authorized once you are discharged, as it is imperative that you return to your primary care physician (or establish a relationship with a primary care physician if you do not have one) for your aftercare needs so that they can reassess your need for medications and monitor your lab values.    Medication List    TAKE these medications        acetaminophen 500 MG tablet  Commonly known as:  TYLENOL  Take 1,000 mg by mouth every 6 (six) hours as needed for pain.     aspirin 81 MG tablet  Take 81 mg by mouth daily.     atorvastatin 10 MG tablet  Commonly known as:  LIPITOR  Take 10 mg by mouth at bedtime.     benzonatate 100 MG capsule  Commonly known as:  TESSALON  Take 100 mg by mouth 3 (three) times daily as needed for cough.     clopidogrel 75 MG tablet  Commonly known as:  PLAVIX  Take 75 mg by mouth daily.     COLCRYS 0.6 MG tablet  Generic drug:  colchicine  Take 0.6 mg by mouth daily as needed (gout).     ipratropium 0.03 % nasal spray  Commonly known as:  ATROVENT  Place 1 spray into the nose daily as needed.     isosorbide mononitrate 120 MG 24 hr tablet  Commonly known as:  IMDUR  TAKE ONE TABLET BY MOUTH ONCE DAILY.     mexiletine 200 MG capsule  Commonly known as:  MEXITIL  Take 1 capsule (200 mg total) by mouth every 12 (twelve) hours.     MULTIVITAMIN PO  Take 2 tablets by mouth daily.     nitroGLYCERIN 0.4 MG SL tablet  Commonly known as:  NITROSTAT  Place 0.4 mg under the tongue every 5 (five) minutes as needed for chest pain.     OVER THE COUNTER MEDICATION  1 scoop. Barley life otc medication by aim.     pantoprazole 40 MG tablet  Commonly known as:  PROTONIX  TAKE ONE TABLET BY MOUTH ONCE DAILY     ranolazine 1000 MG SR tablet  Commonly known as:  RANEXA  Take 1 tablet (1,000 mg total) by mouth 2 (two) times daily.           Follow-up Information    Follow up with Garlan Fillers, MD. Schedule an  appointment as soon as possible for a visit in 1 week.   Specialty:  Internal Medicine   Contact information:   [address] [place] [address] [phone]      The results of significant diagnostics from this hospitalization (including imaging, microbiology, ancillary and laboratory) are listed below for reference.    Significant Diagnostic Studies: Ct Head Wo Contrast  04/26/2015  CLINICAL DATA:  Altered mental status. EXAM: CT HEAD WITHOUT CONTRAST TECHNIQUE: Contiguous axial images were obtained from the base of the skull through the vertex without intravenous contrast. COMPARISON:  None. FINDINGS: Generalized atrophy, cerebellar greater than cerebral. Mild chronic small vessel ischemia. Small lacunar infarct in left basal ganglia. No intracranial hemorrhage, mass effect, or midline shift. No hydrocephalus. The basilar cisterns are patent. No evidence of territorial infarct. No intracranial fluid  collection. Calvarium is intact. Minimal mucosal thickening of maxillary sinuses and ethmoid air cells. Diffuse opacification of right mastoid air cells, left mastoid air cells are clear. IMPRESSION: 1. Generalized atrophy with mild chronic small vessel ischemia. Small lacunar infarcts in the left basal ganglia, likely remote but age indeterminate. 2. No acute intracranial abnormality. 3. Mild paranasal sinus inflammatory change. Opacification of right mastoid air cells. Electronically Signed   By: Rubye Oaks M.D.   On: 04/26/2015 23:16   Dg Chest Portable 1 View  04/26/2015  CLINICAL DATA:  Altered mental status. EXAM: PORTABLE CHEST 1 VIEW COMPARISON:  03/05/2015 FINDINGS: Postoperative changes in the mediastinum. Shallow inspiration with elevation of the left hemidiaphragm. Normal heart size and pulmonary vascularity. Atelectasis in the lung bases. No focal consolidation. No blunting of costophrenic angles. No pneumothorax. Calcified and tortuous aorta. Degenerative changes in the  spine. IMPRESSION: Shallow inspiration with atelectasis in the lung bases. Electronically Signed   By: Burman Nieves M.D.   On: 04/26/2015 23:47   Labs: Basic Metabolic Panel:  Recent Labs Lab 04/26/15 2152 04/26/15 2210 04/27/15 0427  NA 139 142 140  K 4.4 4.3 4.2  CL 108 106 106  CO2 22  --  27  GLUCOSE 129* 119* 123*  BUN 26* 28* 24*  CREATININE 1.54* 1.50* 1.50*  CALCIUM 8.6*  --  8.6*   Liver Function Tests:  Recent Labs Lab 04/26/15 2152  AST 21  ALT 17  ALKPHOS 64  BILITOT 0.2*  PROT 5.9*  ALBUMIN 3.4*   CBC:  Recent Labs Lab 04/26/15 2152 04/26/15 2210  WBC 6.7  --   HGB 11.2* 11.6*  HCT 33.8* 34.0*  MCV 102.4*  --   PLT 161  --    Cardiac Enzymes:  Recent Labs Lab 04/27/15 0427 04/27/15 1031  TROPONINI <0.03 0.03   CBG:  Recent Labs Lab 04/26/15 2201  GLUCAP 109*   Signed:  Lakendrick Paradis  [facility] 04/27/2015, 3:34 PM

## 2015-04-27 NOTE — Consult Note (Signed)
CARDIOLOGY - ELECTROPHYSIOLOGY CONSULT NOTE   Patient ID: Karl Stevenson MRN: [id number], DOB/AGE: 79-Oct-1928   Admit date: 04/26/2015 Date of Consult: 04/27/2015 Reason for Consult: Presyncope; History of NSVT   Primary Physician: Garlan Fillers, MD Primary Cardiologist: Dr. Excell Seltzer Electrophysiologist: Dr. Graciela Husbands   HPI: Karl Stevenson is a 81 y.o. male with past medical history of CAD (s/p CABG in 1987, re-do in 2008, last cath 02/2015 with occluded SVG-OM1, patent SVG-OM2, SVG-LAD, and SVG-OM3), ischemic DCM (EF 20-25% by echo in 02/2015), PVD (s/p left CEA 2010), AAA, renal artery stenosis, moderate left subclavian stenosis, chronic systolic CHF, HTN, HLD, COPD, and intermittent bradycardia and tachycardia with bifasicular block who presented to [facility] on 04/26/2015 following a syncopal event.  The patient reports while home on 04/26/2015, he was returning from the restroom when his legs began to tremble. He took a seat on the couch, told his wife he did not feel well, then became unresponsive, diaphoretic and pale. He believes his syncopal event only lasted a few seconds. Unfortunately, his wife is not present at the time of this encounter to verify the events which took place. EMS was called and his initial BP was 90/54 and HR was 50 upon their arrival. A fluid bolus was administered at that time. He reports this feels nothing like the events that occurred in October which brought him to the hospital. Denies any chest pain, palpitations, or shortness of breath. Reports consuming very little fluids, saying "I drink water with my medications and that is about it".   While admitted, his telemetry has shown no episodes of VT. EKG shows NSR, prolonged PR interval, and known RBBB. Troponin negative.  Creatinine elevated to 1.50. Urine sample showed amber colored urine, normal specific gravity. No other abnormalities.  His BP has remained stable at 113/53 - 167/94 while admitted. Has  been walking around his room this morning with no recurrence in his symptoms. Denies any repeat lightheadedness, dizziness, or syncope.  He was recently admitted from 03/05/2015 to 03/13/2015 following a 36 second run of VT. During that admission, the options of ICD vs. Medical therapy were discussed and the patient and his wife favored medical therapy. He was placed on Mexiletine and Ranexa at that time. He had repeat bouts of bradycardia in the 30's, so Amiodarone was discontinued at that time.   Was recently seen in the office on 04/16/2015 and reported having two "weak spells" in the past month which required him to sit down and rest due to his legs trembling. Denied any repeat palpitations or pre-syncopal events at that time.   Problem List  Past Medical History  Diagnosis Date  . Hyperlipidemia   . Hypertension   . Carotid artery occlusion     a. s/p L CEA in 2010;  b. 06/2014 Carotid U/S: stable LICA CEA site w/o significant stenosis bilat.  . Crohn's disease (HCC)     a. s/p colon resection  . Bradycardia     a. previous intolerance to beta blockers - low dose toprol started 10/2011 for tachycardia but had rash and was discontinued;  b. tolerating low dose propranolol.  . Chronic systolic heart failure (HCC)     a. 02/2015 Echo:  35-40% (echo 11/2010); c. 02/2015 Echo: EF 20-25%, diff HK, mid-apicalanteroseptal and apical AK, Gr 1 DD, mild MR.  . Ischemic cardiomyopathy     a. EF 34% (Lexiscan 06/2011); b. 35-40% (echo 11/2010); c. 02/2015 Echo: EF 20-25%, diff HK, mid-apicalanteroseptal and apical  AK, Gr 1 DD, mild MR.  . Coronary artery disease     a. 1969 s/p MI;  b. S/P CABG 1987;  c. S/P redo 2008;  d. 07/2012 Cath: LM 95, LAD 100p, 95apical after graft insertion, D1 70, LCX 100p, VG->OM1 60d, VG->mLAD from VG->OM (Y graft) nl, VG->OM2->LPDA patent, LIMA->LAD known atresis, EF 40%; e. 02/2014 MV: anterior and posterolateral/apical infarct with mild peri-infarct ischemia->Med Rx.  .  Peripheral vascular disease (HCC)     a. Carotid dz s/p L CEA, RAS, AAA, LE dz  . AAA (abdominal aortic aneurysm) (HCC)     a. Ectatic abdominal aorta with fusiform dilitation 3.4x3.4 and moderate thrombus burden 12/2010  . RBBB   . Tachycardia     a. 10/2011 - atrial tach vs avnrt - BB started.  [name] COPD (chronic obstructive pulmonary disease) (HCC)   . Ventricular tachycardia (HCC)     a. 02/2015 ->amio 200 daily added.  . Gout   . Renal artery stenosis (HCC)     a. Dopplers 12/2010 - 1-59% R ostial renal artery stenosis, 2 L renal arteries both widely patent    Past Surgical History  Procedure Laterality Date  . Carotid endarterectomy Left 2010  . Colon resection  2008  . Tonsillectomy      as a child  . Pr vein bypass graft,aorto-fem-pop  10/1985  . Femoral endarterectomy Left  06/2011    ileofemoral endarterectomy with bovine patch angioplasty  . Endarterectomy  08/26/2011    Procedure: ENDARTERECTOMY ILIAC;  Surgeon: [name], MD;  Location: [facility] OR;  Service: Vascular;  Laterality: Right;  Right Femoral Artery Endarterectomy with vascu guard patch angioplasty & intraoperative arteriogram.  . Cardiac catheterization  07/28/2012    Native 3v CAD, continued patency of the SVG-OM1-LAD and SVG-OM2-left PDA. LVEF 40% with multiple WMAs  . Abdominal aortagram N/A 06/11/2011    Procedure: ABDOMINAL [name];  Surgeon: [name], MD;  Location: [facility] CATH LAB;  Service: Cardiovascular;  Laterality: N/A;  . Left heart catheterization with coronary angiogram N/A 07/28/2012    Procedure: LEFT HEART CATHETERIZATION WITH CORONARY ANGIOGRAM;  Surgeon: [name], MD;  Location: [facility] CATH LAB;  Service: Cardiovascular;  Laterality: N/A;  . Colon surgery    . Cataract extraction, bilateral    . Coronary artery bypass graft  10/31/85 & 08/19/06  . Coronary angioplasty    . Cardiac catheterization N/A 03/08/2015    Procedure: Left Heart Cath and Coronary Angiography;  Surgeon: [name], MD;   Location: [facility] INVASIVE CV LAB;  Service: Cardiovascular;  Laterality: N/A;     Allergies  Allergies  Allergen Reactions  . Novocain [Procaine Hcl] Other (See Comments)    Cold sweats and very bad headache.   . Metoprolol Hives, Itching and Rash      Inpatient Medications  . aspirin  81 mg Oral Daily  . atorvastatin  10 mg Oral QHS  . clopidogrel  75 mg Oral Daily  . enoxaparin (LOVENOX) injection  30 mg Subcutaneous Q24H  . famotidine  20 mg Oral BID  . isosorbide mononitrate  120 mg Oral Daily  . mexiletine  200 mg Oral Q12H  . pantoprazole  40 mg Oral Daily  . ranolazine  1,000 mg Oral BID  . sodium chloride  3 mL Intravenous Q12H    Family History Family History  Problem Relation Age of Onset  . Heart disease Father   . Cancer Mother   . COPD Mother   .  Anesthesia problems Neg Hx   . Deep vein thrombosis Brother   . COPD Brother   . CAD Brother      Social History Social History   Social History  . Marital Status: Married    Spouse Name: N/A  . Number of Children: N/A  . Years of Education: N/A   Occupational History  . Not on file.   Social History Main Topics  . Smoking status: Never Smoker   . Smokeless tobacco: Never Used  . Alcohol Use: No  . Drug Use: No  . Sexual Activity: Not on file   Other Topics Concern  . Not on file   Social History Narrative     Review of Systems General:  No chills, fever, night sweats or weight changes. Positive for generalized fatigue. Cardiovascular:  No chest pain, dyspnea on exertion, edema, orthopnea, palpitations, paroxysmal nocturnal dyspnea. Dermatological: No rash, lesions/masses Respiratory: No cough, dyspnea Urologic: No hematuria, dysuria Abdominal:   No nausea, vomiting, diarrhea, bright red blood per rectum, melena, or hematemesis Neurologic:  No visual changes, changes in mental status. Positive for dizziness, lightheadedness, and syncope. All other systems reviewed and are otherwise negative  except as noted above.  Physical Exam  Blood pressure 126/74, pulse 68, temperature 97.9 F (36.6 C), temperature source Oral, resp. rate 18, height  (1.702 m), SpO2 96 %.  General: Pleasant, elderly Caucasian male in NAD Psych: Normal affect. Neuro: Alert and oriented X 3. Moves all extremities spontaneously. HEENT: Normal  Neck: Supple without bruits or JVD. Lungs:  Resp regular and unlabored, CTA without wheezing or rales. Heart: RRR no s3, s4, or murmurs. Abdomen: Soft, non-tender, non-distended, BS + x 4.  Extremities: No clubbing, cyanosis or edema. DP/PT/Radials 2+ and equal bilaterally.  Labs   Recent Labs  04/27/15 0427  TROPONINI <0.03   Lab Results  Component Value Date   WBC 6.7 04/26/2015   HGB 11.6* 04/26/2015   HCT 34.0* 04/26/2015   MCV 102.4* 04/26/2015   PLT 161 04/26/2015    Recent Labs Lab 04/26/15 2152  04/27/15 0427  NA 139  < > 140  K 4.4  < > 4.2  CL 108  < > 106  CO2 22  --  27  BUN 26*  < > 24*  CREATININE 1.54*  < > 1.50*  CALCIUM 8.6*  --  8.6*  PROT 5.9*  --   --   BILITOT 0.2*  --   --   ALKPHOS 64  --   --   ALT 17  --   --   AST 21  --   --   GLUCOSE 129*  < > 123*  < > = values in this interval not displayed. Lab Results  Component Value Date   CHOL 99 03/06/2015   HDL 30* 03/06/2015   LDLCALC 41 03/06/2015   TRIG 138 03/06/2015   Lab Results  Component Value Date   DDIMER 11.79* 11/24/2010    Radiology/Studies  Ct Head Wo Contrast: 04/26/2015  CLINICAL DATA:  Altered mental status. EXAM: CT HEAD WITHOUT CONTRAST TECHNIQUE: Contiguous axial images were obtained from the base of the skull through the vertex without intravenous contrast. COMPARISON:  None. FINDINGS: Generalized atrophy, cerebellar greater than cerebral. Mild chronic small vessel ischemia. Small lacunar infarct in left basal ganglia. No intracranial hemorrhage, mass effect, or midline shift. No hydrocephalus. The basilar cisterns are patent. No  evidence of territorial infarct. No intracranial fluid collection. Calvarium is intact. Minimal  mucosal thickening of maxillary sinuses and ethmoid air cells. Diffuse opacification of right mastoid air cells, left mastoid air cells are clear. IMPRESSION: 1. Generalized atrophy with mild chronic small vessel ischemia. Small lacunar infarcts in the left basal ganglia, likely remote but age indeterminate. 2. No acute intracranial abnormality. 3. Mild paranasal sinus inflammatory change. Opacification of right mastoid air cells. Electronically Signed   By: Rubye Oaks M.D.   On: 04/26/2015 23:16   Dg Chest Portable 1 View: 04/26/2015  CLINICAL DATA:  Altered mental status. EXAM: PORTABLE CHEST 1 VIEW COMPARISON:  03/05/2015 FINDINGS: Postoperative changes in the mediastinum. Shallow inspiration with elevation of the left hemidiaphragm. Normal heart size and pulmonary vascularity. Atelectasis in the lung bases. No focal consolidation. No blunting of costophrenic angles. No pneumothorax. Calcified and tortuous aorta. Degenerative changes in the spine. IMPRESSION: Shallow inspiration with atelectasis in the lung bases. Electronically Signed   By: Burman Nieves M.D.   On: 04/26/2015 23:47    ECG:  NSR, HR 50's,  prolonged PR interval, and known RBBB  ECHOCARDIOGRAM: 03/06/2015 Study Conclusions - Left ventricle: The cavity size was normal. Systolic function was severely reduced. The estimated ejection fraction was in the range of 20% to 25%. Diffuse hypokinesis. There is akinesis of the mid-apicalanteroseptal and apical myocardium. Doppler parameters are consistent with abnormal left ventricular relaxation (grade 1 diastolic dysfunction). - Mitral valve: Calcified annulus. There was mild regurgitation.  Impressions: - When compared to prior echocardiogram, EF is reduced (prior 35%).  ASSESSMENT AND PLAN  1. Syncope in the setting of Recurrent Episodes of NSVT - reports his event last  night was different from the events which occurred in October (had palpitations and 36 beats of NSVT at that time. Preferred medical therapy over an ICD). Denies any palpitations in the past month.  - BP was in the 90's upon the arrival of EMS. Fluid bolus was administered and he reported significant improvement in his symptoms. Denies any repeat bouts of lightheadedness, dizziness, or syncope since. - telemetry has shown no evidence of Ventricular Tachycardia. The patient reports good compliance with his Ranexa and Mexiletine.  - while this event could have been due to VT, seems unlikely given he denies any of the symptoms he experienced with previous episodes. - patient prefers to continue with medical therapy for his VT at this time, saying he has felt significantly better in the past month than the previous year.    2. History of CAD - s/p CABG in 1987, re-do in 2008 - recent cath in 02/2015 showed occluded SVG-OM1 with patent SVG-OM2, SVG-LAD, and SVG-OM3 - continue ASA, Plavix, Statin,  Imdur, and Ranexa. No BB due to episodes of bradycardia.  3. Ischemic DCM (EF 20 - 25% by echo in 02/2015) - does not appear volume overloaded on exam - continue current medication regimen. No BB due to bradycardia.  4. HTN - BP has been 113/53 - 167/94 while admitted  5. HLD - continue statin therapy.  6. Acute on Chronic CKD Stage 3 - Baseline around 1.2 - 1.3. - Creatinine at 1.50 on 04/27/2015.   Signed, Ellsworth Lennox, PA-C 04/27/2015, 8:52 AM Pager: [phone]  I have seen and examined this patient with [place].  Agree with above, note added to reflect my findings.  On exam, regular rhythm, no murmurs, lungs clear.  Had an episode of confusion yesterday associated with weakness of his arms and legs which required hospitalization. After IV fluids he felt improved but this took  4-5 hours. In speaking with him this is different from his episodes of VT in the past where he felt  palpitations. The VT episodes were short-lived and he felt well after returning to normal rhythm. It is unclear exactly what happened during this event but it is possible that he was dehydrated as he improved with IV fluids. From a cardiac standpoint there is no further workup that is needed. He should follow up in cardiology clinic with his regularly scheduled visit. If he does decide that he would like a defibrillator placed that can be discussed at that visit. At this point he does not want any further intervention aside for medical management.    Will M. Camnitz MD 04/27/2015 12:03 PM

## 2015-05-02 ENCOUNTER — Telehealth: Payer: Self-pay | Admitting: Cardiovascular Disease

## 2015-05-02 NOTE — Telephone Encounter (Signed)
Patient is having a difficult time swallowing his Ranexa 1000 mg tablet. Can we order him Ranexa 500 mg instead and have him take two tablets twice today? Will forward to Dr. Excell Seltzer.

## 2015-05-02 NOTE — Telephone Encounter (Signed)
New Message  Pt c/o medication issue: 1. Name of Medication: ranolazine (RANEXA) 1000 MG SR tablet  4. What is your medication issue? Tried to take the 1000 mg having a problem swallowing it. Request a call back to discuss

## 2015-05-07 MED ORDER — RANOLAZINE ER 500 MG PO TB12: 1000.0000 mg | ORAL_TABLET | Freq: Two times a day (BID) | ORAL | Status: DC

## 2015-05-07 NOTE — Telephone Encounter (Signed)
I looked at Ranexa 500mg  and 1000mg  tablets in the sample closet and there is a difference in size of tablets. Due to the pt having difficulty swallowing the 1000mg  tablet I will change Rx to 500mg  take 2 tablets by mouth twice a day. Updated Rx sent to the pharmacy. I left voicemail on the pt's answering machine that Rx has been changed.

## 2015-05-08 NOTE — Progress Notes (Signed)
Cardiology Office Note Date:  05/08/2015  Patient ID:  Karl Stevenson, DOB 1926/12/29, MRN [id number] PCP:  Garlan Fillers, MD  Cardiologist:  Dr. Excell Seltzer Electrophysiologist: Dr. Graciela Husbands    Chief Complaint: f/u visit after adjustment inhis medicines.  History of Present Illness: Karl Stevenson is a 81 y.o. male with history of CAD, AAA, RAS, moderate L subclavian stenosis, chronic Systolic CHF, HTN, HLD, and COPD and CRI.  He has longstanding history of palpitations, most recently found to have VT on an event monitor and was referred to [facility] for further evalaution.  He was started on IV amiodarone, then was started on Ranexa, Mexiletine, and PO amiodarone.  He developed a near syncopal event associated bradycardia 30'sBPM, though felt to be vagal, the amiodarone was stopped.  To note, records indicate his only symptom with the VT was palpitations, no near syncope or syncope.  It was recommended upon discharge to avoid nodal blocking agents with conduction disease onhis EKG and the bradycardic event.  The patient/wife have expressed that they would like to avoid devioce implant, given advancing age, felt reasonable.  He was seen in f/u post hospital 04/02/15 with what was felt to be orthostatic symptoms noting + orthostatic BP change here and reported + orthostatic BP checks by his home visiting nurse/therapist, his amlodipine was stopped. At that visit reported his palpitations generally improved but not resolved.  He saw Norma Fredrickson, NP on 04/16/15 feeling less frequent symptoms and not orthostatic at that visit.  His wife called 911 on 04/26/15 with an episode where he seemed to be staring into space, did not respond to her, but did not fall, he was walking to the BR at the time, she says she was able to help him lie down on the couch  "he was just not there, was not himself"  ER record was reviewed, they report EMS found him "mildly bradycardic" though no specific HR reported and record  mentions his SBP was in the 90's that resolved with IVF.  The record described him with AMS, confused, he states he was aware of FR being at his home, and ER states he was oriented to self and place, but his wife states slow to recognize her and not himself, that resolved to baseline, the duration it took until he was at baseline is reported to have been about 4 hours by the wife.  He was seen by Dr. Elberta Fortis who did not feel this event was attribute to his VT by description and no VT or significant bradycardia was reported to be observed.  His discharge summary reports episode felt to be relate to dehydration.  The patient today reports states that he has had no palpitations at all, and from this standpoint feels much better.  He had a episode of feeling weak in his legs when getting up but his grandson walked with him up/down the hall and this resolved.  He has not had recurrent episodes of confusion, and his home BP are being reported 120's-150's.  He has not had any CP or SOB. Denies DOE, no symptoms of PND or orthopnea.  He mentions since November he noted that when he swallows he can feel it radiate to his L ear and hs hearing in the ear is diminished as well since starting the mexiletine, this not a known or observed side effect of the medicine (or ranexa) and possibly a coincidence of timing.  To see his PMD or ENT for evaluation and input.  Past  Medical History  Diagnosis Date  . Hyperlipidemia   . Hypertension   . Carotid artery occlusion     a. s/p L CEA in 2010;  b. 06/2014 Carotid U/S: stable LICA CEA site w/o significant stenosis bilat.  . Crohn's disease (HCC)     a. s/p colon resection  . Bradycardia     a. previous intolerance to beta blockers - low dose toprol started 10/2011 for tachycardia but had rash and was discontinued;  b. tolerating low dose propranolol.  . Chronic systolic heart failure (HCC)     a. 02/2015 Echo:  35-40% (echo 11/2010); c. 02/2015 Echo: EF 20-25%, diff HK,  mid-apicalanteroseptal and apical AK, Gr 1 DD, mild MR.  . Ischemic cardiomyopathy     a. EF 34% (Lexiscan 06/2011); b. 35-40% (echo 11/2010); c. 02/2015 Echo: EF 20-25%, diff HK, mid-apicalanteroseptal and apical AK, Gr 1 DD, mild MR.  . Coronary artery disease     a. 1969 s/p MI;  b. S/P CABG 1987;  c. S/P redo 2008;  d. 07/2012 Cath: LM 95, LAD 100p, 95apical after graft insertion, D1 70, LCX 100p, VG->OM1 60d, VG->mLAD from VG->OM (Y graft) nl, VG->OM2->LPDA patent, LIMA->LAD known atresis, EF 40%; e. 02/2014 MV: anterior and posterolateral/apical infarct with mild peri-infarct ischemia->Med Rx.  . Peripheral vascular disease (HCC)     a. Carotid dz s/p L CEA, RAS, AAA, LE dz  . AAA (abdominal aortic aneurysm) (HCC)     a. Ectatic abdominal aorta with fusiform dilitation 3.4x3.4 and moderate thrombus burden 12/2010  . RBBB   . Tachycardia     a. 10/2011 - atrial tach vs avnrt - BB started.  [name] COPD (chronic obstructive pulmonary disease) (HCC)   . Ventricular tachycardia (HCC)     a. 02/2015 ->amio 200 daily added.  . Gout   . Renal artery stenosis (HCC)     a. Dopplers 12/2010 - 1-59% R ostial renal artery stenosis, 2 L renal arteries both widely patent    Past Surgical History  Procedure Laterality Date  . Carotid endarterectomy Left 2010  . Colon resection  2008  . Tonsillectomy      as a child  . Pr vein bypass graft,aorto-fem-pop  10/1985  . Femoral endarterectomy Left  06/2011    ileofemoral endarterectomy with bovine patch angioplasty  . Endarterectomy  08/26/2011    Procedure: ENDARTERECTOMY ILIAC;  Surgeon: [name], MD;  Location: [facility];  Service: Vascular;  Laterality: Right;  Right Femoral Artery Endarterectomy with vascu guard patch angioplasty & intraoperative arteriogram.  . Cardiac catheterization  07/28/2012    Native 3v CAD, continued patency of the SVG-OM1-LAD and SVG-OM2-left PDA. LVEF 40% with multiple WMAs  . Abdominal aortagram N/A 06/11/2011    Procedure:  ABDOMINAL [name];  Surgeon: [name], MD;  Location: [facility] CATH LAB;  Service: Cardiovascular;  Laterality: N/A;  . Left heart catheterization with coronary angiogram N/A 07/28/2012    Procedure: LEFT HEART CATHETERIZATION WITH CORONARY ANGIOGRAM;  Surgeon: [name], MD;  Location: [facility] CATH LAB;  Service: Cardiovascular;  Laterality: N/A;  . Colon surgery    . Cataract extraction, bilateral    . Coronary artery bypass graft  10/31/85 & 08/19/06  . Coronary angioplasty    . Cardiac catheterization N/A 03/08/2015    Procedure: Left Heart Cath and Coronary Angiography;  Surgeon: [name], MD;  Location: [facility] INVASIVE CV LAB;  Service: Cardiovascular;  Laterality: N/A;    Current Outpatient Prescriptions  Medication  Sig Dispense Refill  . acetaminophen (TYLENOL) 500 MG tablet Take 1,000 mg by mouth every 6 (six) hours as needed for pain.    [name] aspirin 81 MG tablet Take 81 mg by mouth daily.     [name] atorvastatin (LIPITOR) 10 MG tablet Take 10 mg by mouth at bedtime.     . benzonatate (TESSALON) 100 MG capsule Take 100 mg by mouth 3 (three) times daily as needed for cough.     . clopidogrel (PLAVIX) 75 MG tablet Take 75 mg by mouth daily.     [name] COLCRYS 0.6 MG tablet Take 0.6 mg by mouth daily as needed (gout).     [name] ipratropium (ATROVENT) 0.03 % nasal spray Place 1 spray into the nose daily as needed.     . isosorbide mononitrate (IMDUR) 120 MG 24 hr tablet TAKE ONE TABLET BY MOUTH ONCE DAILY. 180 tablet 2  . mexiletine (MEXITIL) 200 MG capsule Take 1 capsule (200 mg total) by mouth every 12 (twelve) hours. 60 capsule 3  . Multiple Vitamins-Minerals (MULTIVITAMIN PO) Take 2 tablets by mouth daily.     . nitroGLYCERIN (NITROSTAT) 0.4 MG SL tablet Place 0.4 mg under the tongue every 5 (five) minutes as needed for chest pain.    [name] OVER THE COUNTER MEDICATION 1 scoop. Barley life otc medication by aim.    . pantoprazole (PROTONIX) 40 MG tablet TAKE ONE TABLET BY MOUTH ONCE DAILY 90 tablet 2    . ranolazine (RANEXA) 500 MG 12 hr tablet Take 2 tablets (1,000 mg total) by mouth 2 (two) times daily. 120 tablet 6   No current facility-administered medications for this visit.    Allergies:   Novocain and Metoprolol   Social History:  The patient  reports that he has never smoked. He has never used smokeless tobacco. He reports that he does not drink alcohol or use illicit drugs.   Family History:  The patient's family history includes CAD in his brother; COPD in his brother and mother; Cancer in his mother; Deep vein thrombosis in his brother; Heart disease in his father. There is no history of Anesthesia problems.  ROS:  Please see the history of present illness.   All other systems are reviewed and otherwise negative.   PHYSICAL EXAM:  VS:  There were no vitals taken for this visit. BMI: There is no weight on file to calculate BMI. Thin, elderly, in no acute distress HEENT: normocephalic, atraumatic Neck: no JVD, carotid bruits or masses Cardiac:  normal S1, S2; RRR; no significant murmurs, no rubs, or gallops Lungs:  clear to auscultation bilaterally, no wheezing, rhonchi or rales Abd: soft, nontender MS: no deformity, age appropriate atrophy Ext: no edema Skin: warm and dry, no rash Neuro:  No gross deficits appreciated Psych: euthymic mood, full affect  EKG:  04/26/15 SB, 57, 1st degree AVBlock, RBBB, LAD 04/02/15 shows SR, 73bpm, RBBB, LAFB, bifasicular block  Recent Labs: 04/16/2015: Magnesium 1.7 04/26/2015: ALT 17; Hemoglobin 11.6*; Platelets 161 04/27/2015: BUN 24*; Creatinine, Ser 1.50*; Potassium 4.2; Sodium 140  03/06/2015: Cholesterol 99; HDL 30*; LDL Cholesterol 41; Total CHOL/HDL Ratio 3.3; Triglycerides 138; VLDL 28   CrCl cannot be calculated (Unknown ideal weight.).   Wt Readings from Last 3 Encounters:  04/16/15 135 lb 12.8 oz (61.598 kg)  04/02/15 134 lb 1.9 oz (60.836 kg)  03/13/15 129 lb 12.8 oz (58.877 kg)    03/08/15: Cardiac  cath Impression:  Known severe native coronary disease. Disabled contrast the native arteries  were not evaluated.  Occluded old SVG-OM1 - the dye remained standing in the proximal vessel for several minutes following angiography - not PCI amenable  This is the likely culprit for the patient's episode of what sounds like anginal symptoms about a month ago. This may also be a cause of now worsening ventricular tachycardia.   Widely patent Y graft with one limb providing sequential grafting to OM1 and OM 2 as well as a separate limb serving as a graft to the LAD which retrograde fills the diagonal branch. This redo graft now provides the entirety of the patient's coronary circulation.  The graft obtuse marginal branches are relatively free of disease with retrograde filling back to the distal circumflex system which consists of the posterolateral branch and left PDA.  The grafted LAD itself was a very diffusely diseased vessel, but no change from previous.  Known small nondominant right coronary artery system.  Borderline low LVEDP Recommendations:  No PCI targets.  Standard post radial cath care with TR band removal  Would proceed with ICD placement per EP  03/06/15: Echocardiogram Study Conclusions - Left ventricle: The cavity size was normal. Systolic function was severely reduced. The estimated ejection fraction was in the range of 20% to 25%. Diffuse hypokinesis. There is akinesis of the mid-apicalanteroseptal and apical myocardium. Doppler parameters are consistent with abnormal left ventricular relaxation (grade 1 diastolic dysfunction). - Mitral valve: Calcified annulus. There was mild regurgitation. Impressions: - When compared to prior echocardiogram, EF is reduced (prior 35%).  Other studies reviewed: Additional studies/records reviewed today include: summarized above  ASSESSMENT AND PLAN:  1. VT, conduction system disease    Re-counseled No driving  60months post his most recent hospital stay    Palpitations are resolved per the patient    Continue the same Mexilitine and ranexa     The patient and his wife would prefer to avoid device implant given not to be helpful with his episodes of low BP which appears to be what he has been symptomatic with of late, though this also sounds improved     Avoid any nodal blocking agents  2. CAD, ICM     s/p cath 03/08/15, as above, no intervention/targets, medical management     Not on betablocker with bradycardia, conduction disease. no ace likely secondry to CRI, stage III by his records     no anginal c/o     on ASA, Plavix, statin     Appears compensated, no evidence of fluid OL on his exam today  3. HTN    Orthostatic hypotension off norvasc is significantly better with much fewer episodes and negative orthostatic check here today and at his last cardiology visit    Encouraged adequate hydration while watching his weight for overload  4. Syncope?  AMS/confusion is described 04/26/15      No significant bradycardia or VT is noted to have been observed, reportedly took hours to resolve      Hypotension was noted, responding to IVF and felt to have a component of dehydration as etiology             Disposition: F/u in 2 months, sooner if needed.  He has a carotid US and vascular f/u coming up as well. And is instructed to discuss with his PMD this event and any further evaluation felt indicated.   Judith Blonder, PA-C 05/08/2015 2:53 PM     [facility] [address] [place] [address] [phone] (office)  [phone] (fax)

## 2015-05-09 ENCOUNTER — Encounter: Payer: Self-pay | Admitting: Physician Assistant

## 2015-05-09 ENCOUNTER — Ambulatory Visit (INDEPENDENT_AMBULATORY_CARE_PROVIDER_SITE_OTHER): Payer: Medicare PPO | Admitting: Physician Assistant

## 2015-05-09 VITALS — BP 156/80 | HR 72 | Ht 67.0 in | Wt 137.4 lb

## 2015-05-09 DIAGNOSIS — I255 Ischemic cardiomyopathy: Secondary | ICD-10-CM | POA: Diagnosis not present

## 2015-05-09 DIAGNOSIS — I5022 Chronic systolic (congestive) heart failure: Secondary | ICD-10-CM | POA: Diagnosis not present

## 2015-05-09 DIAGNOSIS — E785 Hyperlipidemia, unspecified: Secondary | ICD-10-CM | POA: Diagnosis not present

## 2015-05-09 DIAGNOSIS — I472 Ventricular tachycardia, unspecified: Secondary | ICD-10-CM

## 2015-05-09 DIAGNOSIS — I2510 Atherosclerotic heart disease of native coronary artery without angina pectoris: Secondary | ICD-10-CM | POA: Diagnosis not present

## 2015-05-09 DIAGNOSIS — I739 Peripheral vascular disease, unspecified: Secondary | ICD-10-CM | POA: Diagnosis not present

## 2015-05-09 DIAGNOSIS — K5090 Crohn's disease, unspecified, without complications: Secondary | ICD-10-CM | POA: Diagnosis not present

## 2015-05-09 DIAGNOSIS — I10 Essential (primary) hypertension: Secondary | ICD-10-CM | POA: Diagnosis not present

## 2015-05-09 DIAGNOSIS — J449 Chronic obstructive pulmonary disease, unspecified: Secondary | ICD-10-CM | POA: Diagnosis not present

## 2015-05-09 NOTE — Patient Instructions (Signed)
Medication Instructions:   Your physician recommends that you continue on your current medications as directed. Please refer to the Current Medication list given to you today.     If you need a refill on your cardiac medications before your next appointment, please call your pharmacy.  Labwork:  NONE ORDER TODAY   Testing/Procedures:  NONE ORDER TODAY    Follow-Up:  WITH DR Graciela Husbands  IN 2 MONTHS  IF NOT AVAILABLE CAN PUT IN RECALL FOR RENEE URSUY IN 2 MONTHS   Any Other Special Instructions Will Be Listed Below (If Applicable).

## 2015-05-30 DIAGNOSIS — H7201 Central perforation of tympanic membrane, right ear: Secondary | ICD-10-CM | POA: Diagnosis not present

## 2015-05-30 DIAGNOSIS — H9072 Mixed conductive and sensorineural hearing loss, unilateral, left ear, with unrestricted hearing on the contralateral side: Secondary | ICD-10-CM | POA: Diagnosis not present

## 2015-05-30 DIAGNOSIS — Z125 Encounter for screening for malignant neoplasm of prostate: Secondary | ICD-10-CM | POA: Diagnosis not present

## 2015-05-30 DIAGNOSIS — R070 Pain in throat: Secondary | ICD-10-CM | POA: Diagnosis not present

## 2015-05-30 DIAGNOSIS — E784 Other hyperlipidemia: Secondary | ICD-10-CM | POA: Diagnosis not present

## 2015-05-30 DIAGNOSIS — M109 Gout, unspecified: Secondary | ICD-10-CM | POA: Diagnosis not present

## 2015-05-30 DIAGNOSIS — I10 Essential (primary) hypertension: Secondary | ICD-10-CM | POA: Diagnosis not present

## 2015-06-05 DIAGNOSIS — H906 Mixed conductive and sensorineural hearing loss, bilateral: Secondary | ICD-10-CM | POA: Diagnosis not present

## 2015-06-05 DIAGNOSIS — H7201 Central perforation of tympanic membrane, right ear: Secondary | ICD-10-CM | POA: Diagnosis not present

## 2015-06-05 DIAGNOSIS — H9311 Tinnitus, right ear: Secondary | ICD-10-CM | POA: Diagnosis not present

## 2015-06-06 DIAGNOSIS — N183 Chronic kidney disease, stage 3 (moderate): Secondary | ICD-10-CM | POA: Diagnosis not present

## 2015-06-06 DIAGNOSIS — R42 Dizziness and giddiness: Secondary | ICD-10-CM | POA: Diagnosis not present

## 2015-06-06 DIAGNOSIS — K5090 Crohn's disease, unspecified, without complications: Secondary | ICD-10-CM | POA: Diagnosis not present

## 2015-06-06 DIAGNOSIS — Z0000 Encounter for general adult medical examination without abnormal findings: Secondary | ICD-10-CM | POA: Diagnosis not present

## 2015-06-06 DIAGNOSIS — E784 Other hyperlipidemia: Secondary | ICD-10-CM | POA: Diagnosis not present

## 2015-06-06 DIAGNOSIS — R110 Nausea: Secondary | ICD-10-CM | POA: Diagnosis not present

## 2015-06-06 DIAGNOSIS — J449 Chronic obstructive pulmonary disease, unspecified: Secondary | ICD-10-CM | POA: Diagnosis not present

## 2015-06-06 DIAGNOSIS — I2510 Atherosclerotic heart disease of native coronary artery without angina pectoris: Secondary | ICD-10-CM | POA: Diagnosis not present

## 2015-06-06 DIAGNOSIS — M109 Gout, unspecified: Secondary | ICD-10-CM | POA: Diagnosis not present

## 2015-06-07 ENCOUNTER — Other Ambulatory Visit: Payer: Self-pay | Admitting: Internal Medicine

## 2015-06-07 DIAGNOSIS — R1011 Right upper quadrant pain: Secondary | ICD-10-CM

## 2015-06-14 ENCOUNTER — Ambulatory Visit
Admission: RE | Admit: 2015-06-14 | Discharge: 2015-06-14 | Disposition: A | Discharge status: Home / Self Care | Payer: Medicare PPO | Source: Ambulatory Visit | Attending: Internal Medicine | Admitting: Internal Medicine

## 2015-06-14 DIAGNOSIS — R1011 Right upper quadrant pain: Secondary | ICD-10-CM

## 2015-06-14 DIAGNOSIS — K8020 Calculus of gallbladder without cholecystitis without obstruction: Secondary | ICD-10-CM | POA: Diagnosis not present

## 2015-06-20 ENCOUNTER — Other Ambulatory Visit (HOSPITAL_COMMUNITY): Payer: Self-pay | Admitting: Internal Medicine

## 2015-06-20 DIAGNOSIS — K8020 Calculus of gallbladder without cholecystitis without obstruction: Secondary | ICD-10-CM

## 2015-06-28 ENCOUNTER — Encounter: Payer: Self-pay | Admitting: Family

## 2015-06-28 ENCOUNTER — Ambulatory Visit (HOSPITAL_COMMUNITY): Payer: Medicare PPO

## 2015-07-01 ENCOUNTER — Other Ambulatory Visit: Payer: Self-pay | Admitting: Physician Assistant

## 2015-07-02 DIAGNOSIS — I10 Essential (primary) hypertension: Secondary | ICD-10-CM | POA: Diagnosis not present

## 2015-07-02 DIAGNOSIS — Z6821 Body mass index (BMI) 21.0-21.9, adult: Secondary | ICD-10-CM | POA: Diagnosis not present

## 2015-07-03 ENCOUNTER — Telehealth: Payer: Self-pay | Admitting: Internal Medicine

## 2015-07-03 NOTE — Telephone Encounter (Signed)
Spoke with pt. He states when he went to [facility] pharmacy to pick up Mexilitine he was told it was not covered by insurance. Pt reports he has 4 days of medication left.  I told pt I would follow up on this. I called [facility] pharmacy and was told prescription was processed through [facility] but insurance did not pay anything. No prior authorization was requested.  Cost for 30 day supply is $90.

## 2015-07-03 NOTE — Telephone Encounter (Signed)
Left message on home and cell numbers to call back 

## 2015-07-03 NOTE — Telephone Encounter (Signed)
Karl Stevenson is returning your call

## 2015-07-03 NOTE — Telephone Encounter (Signed)
I spoke with [employer] and mexiletine is a Tier 4 medication. Pt has $325 deductible to meet for Tier 3,4 and 5 medications and then mexiletine would be covered at $99 or less. I spoke with pt and gave him this information.  He is able to pay for medication this month and will pick up. He has office visit on 07/16/15 with Dr. Graciela Husbands and will discuss with him at this office visit.

## 2015-07-03 NOTE — Telephone Encounter (Signed)
Pt says his insurance will no longer pay for his Mexiletin.Please call to advise on what he can take in the place of it.

## 2015-07-05 ENCOUNTER — Ambulatory Visit (HOSPITAL_COMMUNITY)
Admission: RE | Admit: 2015-07-05 | Discharge: 2015-07-05 | Disposition: A | Discharge status: Home / Self Care | Payer: Medicare PPO | Source: Ambulatory Visit | Attending: Family | Admitting: Family

## 2015-07-05 ENCOUNTER — Encounter: Payer: Self-pay | Admitting: Family

## 2015-07-05 ENCOUNTER — Ambulatory Visit (INDEPENDENT_AMBULATORY_CARE_PROVIDER_SITE_OTHER): Payer: Medicare PPO | Admitting: Family

## 2015-07-05 VITALS — BP 122/70 | HR 71 | Temp 97.2°F | Resp 16 | Ht 67.0 in | Wt 134.0 lb

## 2015-07-05 DIAGNOSIS — I10 Essential (primary) hypertension: Secondary | ICD-10-CM | POA: Insufficient documentation

## 2015-07-05 DIAGNOSIS — Z48812 Encounter for surgical aftercare following surgery on the circulatory system: Secondary | ICD-10-CM | POA: Diagnosis not present

## 2015-07-05 DIAGNOSIS — I6523 Occlusion and stenosis of bilateral carotid arteries: Secondary | ICD-10-CM

## 2015-07-05 DIAGNOSIS — Z9889 Other specified postprocedural states: Secondary | ICD-10-CM | POA: Diagnosis not present

## 2015-07-05 DIAGNOSIS — E785 Hyperlipidemia, unspecified: Secondary | ICD-10-CM | POA: Diagnosis not present

## 2015-07-05 NOTE — Patient Instructions (Signed)
Stroke Prevention Some medical conditions and behaviors are associated with an increased chance of having a stroke. You may prevent a stroke by making healthy choices and managing medical conditions. HOW CAN I REDUCE MY RISK OF HAVING A STROKE?   Stay physically active. Get at least 30 minutes of activity on most or all days.  Do not smoke. It may also be helpful to avoid exposure to secondhand smoke.  Limit alcohol use. Moderate alcohol use is considered to be:  No more than 2 drinks per day for men.  No more than 1 drink per day for nonpregnant women.  Eat healthy foods. This involves:  Eating 5 or more servings of fruits and vegetables a day.  Making dietary changes that address high blood pressure (hypertension), high cholesterol, diabetes, or obesity.  Manage your cholesterol levels.  Making food choices that are high in fiber and low in saturated fat, trans fat, and cholesterol may control cholesterol levels.  Take any prescribed medicines to control cholesterol as directed by your health care provider.  Manage your diabetes.  Controlling your carbohydrate and sugar intake is recommended to manage diabetes.  Take any prescribed medicines to control diabetes as directed by your health care provider.  Control your hypertension.  Making food choices that are low in salt (sodium), saturated fat, trans fat, and cholesterol is recommended to manage hypertension.  Ask your health care provider if you need treatment to lower your blood pressure. Take any prescribed medicines to control hypertension as directed by your health care provider.  If you are 18-39 years of age, have your blood pressure checked every 3-5 years. If you are 40 years of age or older, have your blood pressure checked every year.  Maintain a healthy weight.  Reducing calorie intake and making food choices that are low in sodium, saturated fat, trans fat, and cholesterol are recommended to manage  weight.  Stop drug abuse.  Avoid taking birth control pills.  Talk to your health care provider about the risks of taking birth control pills if you are over 35 years old, smoke, get migraines, or have ever had a blood clot.  Get evaluated for sleep disorders (sleep apnea).  Talk to your health care provider about getting a sleep evaluation if you snore a lot or have excessive sleepiness.  Take medicines only as directed by your health care provider.  For some people, aspirin or blood thinners (anticoagulants) are helpful in reducing the risk of forming abnormal blood clots that can lead to stroke. If you have the irregular heart rhythm of atrial fibrillation, you should be on a blood thinner unless there is a good reason you cannot take them.  Understand all your medicine instructions.  Make sure that other conditions (such as anemia or atherosclerosis) are addressed. SEEK IMMEDIATE MEDICAL CARE IF:   You have sudden weakness or numbness of the face, arm, or leg, especially on one side of the body.  Your face or eyelid droops to one side.  You have sudden confusion.  You have trouble speaking (aphasia) or understanding.  You have sudden trouble seeing in one or both eyes.  You have sudden trouble walking.  You have dizziness.  You have a loss of balance or coordination.  You have a sudden, severe headache with no known cause.  You have new chest pain or an irregular heartbeat. Any of these symptoms may represent a serious problem that is an emergency. Do not wait to see if the symptoms will   go away. Get medical help at once. Call your local emergency services (911 in [place] Do not drive yourself to the hospital.   This information is not intended to replace advice given to you by your health care provider. Make sure you discuss any questions you have with your health care provider.   Document Released: 06/04/2004 Document Revised: 05/18/2014 Document Reviewed:  10/28/2012 Elsevier Interactive Patient Education 2016 [employer]  

## 2015-07-05 NOTE — Progress Notes (Signed)
Chief Complaint: Extracranial Carotid Artery Stenosis/PAOD   History of Present Illness  Karl Stevenson is a 82 y.o. male patient of Dr. Imogene Burn who is s/p bilateral iliofemoral endarterectomies with patch angioplasty (Date: R 08/26/11, L 07/01/11), L CEA by Dr. Madilyn Fireman (11/20/08). The prior R inguinal seroma has resolved. The patient's treatment regimen currently includes: maximal medical management. He returns today for follow up. Toes of both feet have tingling and pain, right is worse than left, started about 2012, gradually worsening; he does not have DM, denies any known back problems. Pt states his podiatrist told him he has neuropathy.  He had a CABG in 1987 with redo in 2008, veins removed from both legs for CABG.  He was hospitalized at [facility] in October 2016 with v-tach, is seeing Dr. Graciela Husbands and Dr. Excell Seltzer who increased his Ranexa dose to 1 gm bid and started pt on Mexitil.   Pt denies any history of stroke or TIA.  He remains active.  His legs feel weak sometimes; this has been since veins were harvested from his legs for his CABG; otherwise it seems he does not have claudication sx's with walking, denies non healing wounds.  Pt Diabetic: No Pt smoker: non-smoker  Pt meds include: Statin :Yes Betablocker: Yes ASA: Yes Other anticoagulants/antiplatelets: Plavix   Past Medical History  Diagnosis Date  . Hyperlipidemia   . Hypertension   . Carotid artery occlusion     a. s/p L CEA in 2010;  b. 06/2014 Carotid U/S: stable LICA CEA site w/o significant stenosis bilat.  . Crohn's disease (HCC)     a. s/p colon resection  . Bradycardia     a. previous intolerance to beta blockers - low dose toprol started 10/2011 for tachycardia but had rash and was discontinued;  b. tolerating low dose propranolol.  . Chronic systolic heart failure (HCC)     a. 02/2015 Echo:  35-40% (echo 11/2010); c. 02/2015 Echo: EF 20-25%, diff HK, mid-apicalanteroseptal and apical AK, Gr 1 DD, mild MR.  .  Ischemic cardiomyopathy     a. EF 34% (Lexiscan 06/2011); b. 35-40% (echo 11/2010); c. 02/2015 Echo: EF 20-25%, diff HK, mid-apicalanteroseptal and apical AK, Gr 1 DD, mild MR.  . Coronary artery disease     a. 1969 s/p MI;  b. S/P CABG 1987;  c. S/P redo 2008;  d. 07/2012 Cath: LM 95, LAD 100p, 95apical after graft insertion, D1 70, LCX 100p, VG->OM1 60d, VG->mLAD from VG->OM (Y graft) nl, VG->OM2->LPDA patent, LIMA->LAD known atresis, EF 40%; e. 02/2014 MV: anterior and posterolateral/apical infarct with mild peri-infarct ischemia->Med Rx.  . Peripheral vascular disease (HCC)     a. Carotid dz s/p L CEA, RAS, AAA, LE dz  . AAA (abdominal aortic aneurysm) (HCC)     a. Ectatic abdominal aorta with fusiform dilitation 3.4x3.4 and moderate thrombus burden 12/2010  . RBBB   . Tachycardia     a. 10/2011 - atrial tach vs avnrt - BB started.  [name] COPD (chronic obstructive pulmonary disease) (HCC)   . Ventricular tachycardia (HCC)     a. 02/2015 ->amio 200 daily added.  . Gout   . Renal artery stenosis (HCC)     a. Dopplers 12/2010 - 1-59% R ostial renal artery stenosis, 2 L renal arteries both widely patent    Social History Social History  Substance Use Topics  . Smoking status: Never Smoker   . Smokeless tobacco: Never Used  . Alcohol Use: No    Family  History Family History  Problem Relation Age of Onset  . Heart disease Father   . Cancer Mother   . COPD Mother   . Anesthesia problems Neg Hx   . Deep vein thrombosis Brother   . COPD Brother   . CAD Brother     Surgical History Past Surgical History  Procedure Laterality Date  . Carotid endarterectomy Left 2010  . Colon resection  2008  . Tonsillectomy      as a child  . Pr vein bypass graft,aorto-fem-pop  10/1985  . Femoral endarterectomy Left  06/2011    ileofemoral endarterectomy with bovine patch angioplasty  . Endarterectomy  08/26/2011    Procedure: ENDARTERECTOMY ILIAC;  Surgeon: [name], MD;  Location: [facility] OR;  Service:  Vascular;  Laterality: Right;  Right Femoral Artery Endarterectomy with vascu guard patch angioplasty & intraoperative arteriogram.  . Cardiac catheterization  07/28/2012    Native 3v CAD, continued patency of the SVG-OM1-LAD and SVG-OM2-left PDA. LVEF 40% with multiple WMAs  . Abdominal aortagram N/A 06/11/2011    Procedure: ABDOMINAL [name];  Surgeon: [name], MD;  Location: [facility] CATH LAB;  Service: Cardiovascular;  Laterality: N/A;  . Left heart catheterization with coronary angiogram N/A 07/28/2012    Procedure: LEFT HEART CATHETERIZATION WITH CORONARY ANGIOGRAM;  Surgeon: [name], MD;  Location: [facility] CATH LAB;  Service: Cardiovascular;  Laterality: N/A;  . Colon surgery    . Cataract extraction, bilateral    . Coronary artery bypass graft  10/31/85 & 08/19/06  . Coronary angioplasty    . Cardiac catheterization N/A 03/08/2015    Procedure: Left Heart Cath and Coronary Angiography;  Surgeon: [name], MD;  Location: [facility] INVASIVE CV LAB;  Service: Cardiovascular;  Laterality: N/A;  . Eye surgery      Allergies  Allergen Reactions  . Novocain [Procaine Hcl] Other (See Comments)    Cold sweats and very bad headache.   . Metoprolol Hives, Itching and Rash    Current Outpatient Prescriptions  Medication Sig Dispense Refill  . acetaminophen (TYLENOL) 500 MG tablet Take 1,000 mg by mouth every 6 (six) hours as needed for pain.    [name] aspirin 81 MG tablet Take 81 mg by mouth daily.     [name] atorvastatin (LIPITOR) 10 MG tablet Take 10 mg by mouth at bedtime.     . benzonatate (TESSALON) 100 MG capsule Take 100 mg by mouth 3 (three) times daily as needed for cough.     . clopidogrel (PLAVIX) 75 MG tablet Take 75 mg by mouth daily.     [name] COLCRYS 0.6 MG tablet Take 0.6 mg by mouth daily as needed (gout).     [name] ipratropium (ATROVENT) 0.03 % nasal spray Place 1 spray into the nose daily as needed.     . isosorbide mononitrate (IMDUR) 120 MG 24 hr tablet TAKE ONE TABLET BY MOUTH ONCE  DAILY. 180 tablet 2  . mexiletine (MEXITIL) 200 MG capsule TAKE ONE CAPSULE BY MOUTH EVERY 12 HOURS 60 capsule 1  . Multiple Vitamins-Minerals (MULTIVITAMIN PO) Take 2 tablets by mouth daily.     . nitroGLYCERIN (NITROSTAT) 0.4 MG SL tablet Place 0.4 mg under the tongue every 5 (five) minutes as needed for chest pain.    [name] OVER THE COUNTER MEDICATION 1 scoop. Barley life otc medication by aim.    . pantoprazole (PROTONIX) 40 MG tablet TAKE ONE TABLET BY MOUTH ONCE DAILY 90 tablet 2  . ranolazine (RANEXA) 500 MG  12 hr tablet Take 2 tablets (1,000 mg total) by mouth 2 (two) times daily. 120 tablet 6   No current facility-administered medications for this visit.    Review of Systems : See HPI for pertinent positives and negatives.  Physical Examination  Filed Vitals:   07/05/15 1030 07/05/15 1032  BP: 116/66 122/70  Pulse: 72 71  Temp:  97.2 F (36.2 C)  TempSrc:  Oral  Resp:  16  Height:  5\' 7"  (1.702 m)  Weight:  134 lb (60.782 kg)  SpO2:  100%   Body mass index is 20.98 kg/(m^2).  General: A&O x 3, elderly but fit appearing  Pulmonary: Sym exp, good air movt, CTAB, no rales, rhonchi, & wheezing   Cardiac: RRR, Nl S1, S2, no detected murmur  Vascular:  Vessel  Right  Left   Radial  2+Palpable  2+Palpable   Carotid  Palpable, without bruit  Palpable, without bruit   Aorta  Non-palpable  N/A   Femoral  2+Palpable  2+Palpable   Popliteal  2+palpable  2+ palpable   PT  2+Palpable  2+Palpable   DP  2+Palpable  2+Palpable    Musculoskeletal: M/S 4/5 throughout , Extremities without ischemic changes, right ankle pitting edema at 2+, bilateral trace pretibial pitting edema  Neurologic: Pain and light touch intact in extremities , Motor exam as listed above                Non-Invasive Vascular Imaging CAROTID DUPLEX 07/05/2015   Right ICA: <40% stenosis. Left ICA: CEA site with no restenosis. No significant change  compared to 06/29/14   Assessment: RICKARDO BRINEGAR is a 82 y.o. male who is s/p bilateral iliofemoral endarterectomies with patch angioplasty (Date: R 08/26/11, L 07/01/11), L CEA by Dr. Madilyn Fireman (11/20/08).   His legs feel weak sometimes but ABI's and TBI's in February 2016 remained normal with all triphasic waveforms compared th the previous year. He has no hx of stroke or TIA. Today's carotid duplex suggests minimal right ICA stenosis and no restenosis of left CEA site. No significant change compared to 06/29/14.   His atherosclerotic risk factors include significant CAD with a a redo of a CABG. Fortunately he has no DM and has never used tobacco.  He appears fit despite his age and CAD status.   Plan: Follow-up in 1  with Carotid Duplex scan.   I discussed in depth with the patient the nature of atherosclerosis, and emphasized the importance of maximal medical management including strict control of blood pressure, blood glucose, and lipid levels, obtaining regular exercise, and continued cessation of smoking.  The patient is aware that without maximal medical management the underlying atherosclerotic disease process will progress, limiting the benefit of any interventions. The patient was given information about stroke prevention and what symptoms should prompt the patient to seek immediate medical care. Thank you for allowing [place] to participate in this patient's care.  Charisse March, RN, MSN, FNP-C [facility] Office: [phone]  Clinic Physician: Imogene Burn  07/05/2015 10:45 AM

## 2015-07-08 ENCOUNTER — Ambulatory Visit (HOSPITAL_COMMUNITY)
Admission: RE | Admit: 2015-07-08 | Discharge: 2015-07-08 | Disposition: A | Discharge status: Home / Self Care | Payer: Medicare PPO | Source: Ambulatory Visit | Attending: Internal Medicine | Admitting: Internal Medicine

## 2015-07-08 DIAGNOSIS — K8020 Calculus of gallbladder without cholecystitis without obstruction: Secondary | ICD-10-CM | POA: Insufficient documentation

## 2015-07-08 MED ORDER — TECHNETIUM TC 99M MEBROFENIN IV KIT
4.8900 | PACK | Freq: Once | INTRAVENOUS | Status: AC | PRN
  Administered 2015-07-08: 5 via INTRAVENOUS

## 2015-07-08 MED ORDER — SINCALIDE 5 MCG IJ SOLR
INTRAMUSCULAR | Status: AC
  Administered 2015-07-08: 1.2 ug via INTRAVENOUS
  Filled 2015-07-08: qty 5

## 2015-07-08 MED ORDER — SINCALIDE 5 MCG IJ SOLR
0.0200 ug/kg | Freq: Once | INTRAMUSCULAR | Status: AC
Start: 2015-07-08 — End: 2015-07-08
  Administered 2015-07-08: 1.2 ug via INTRAVENOUS

## 2015-07-16 ENCOUNTER — Ambulatory Visit: Payer: Medicare PPO | Admitting: Internal Medicine

## 2015-07-16 ENCOUNTER — Ambulatory Visit: Payer: Medicare PPO | Admitting: Nurse Practitioner

## 2015-07-17 ENCOUNTER — Ambulatory Visit: Payer: Medicare PPO | Admitting: Internal Medicine

## 2015-07-18 ENCOUNTER — Encounter: Payer: Self-pay | Admitting: Internal Medicine

## 2015-07-18 ENCOUNTER — Ambulatory Visit (INDEPENDENT_AMBULATORY_CARE_PROVIDER_SITE_OTHER): Payer: Medicare PPO | Admitting: Internal Medicine

## 2015-07-18 VITALS — BP 147/82 | HR 82 | Ht 67.0 in | Wt 137.0 lb

## 2015-07-18 DIAGNOSIS — I472 Ventricular tachycardia, unspecified: Secondary | ICD-10-CM

## 2015-07-18 DIAGNOSIS — I951 Orthostatic hypotension: Secondary | ICD-10-CM | POA: Diagnosis not present

## 2015-07-18 DIAGNOSIS — I255 Ischemic cardiomyopathy: Secondary | ICD-10-CM

## 2015-07-18 NOTE — Progress Notes (Signed)
Patient Care Team: Jarome Matin, MD as PCP - General (Internal Medicine) [name], MD (Cardiology)   HPI  Karl Stevenson is a 82 y.o. male Seen in follow-up for ventricular tachycardia in the setting of ischemic heart disease prior bypass surgery modest depression of LV function most recently 35-40%.  She's been managed with a combination of mexiletine and amiodarone and ranolazine.  Intercurrently the amiodarone has been discontinued  He denies intercurrent palpitations chest pain or shortness of breath.  Records and Results Reviewed hospital records  Past Medical History  Diagnosis Date  . Hyperlipidemia   . Hypertension   . Carotid artery occlusion     a. s/p L CEA in 2010;  b. 06/2014 Carotid U/S: stable LICA CEA site w/o significant stenosis bilat.  . Crohn's disease (HCC)     a. s/p colon resection  . Bradycardia     a. previous intolerance to beta blockers - low dose toprol started 10/2011 for tachycardia but had rash and was discontinued;  b. tolerating low dose propranolol.  . Chronic systolic heart failure (HCC)     a. 02/2015 Echo:  35-40% (echo 11/2010); c. 02/2015 Echo: EF 20-25%, diff HK, mid-apicalanteroseptal and apical AK, Gr 1 DD, mild MR.  . Ischemic cardiomyopathy     a. EF 34% (Lexiscan 06/2011); b. 35-40% (echo 11/2010); c. 02/2015 Echo: EF 20-25%, diff HK, mid-apicalanteroseptal and apical AK, Gr 1 DD, mild MR.  . Coronary artery disease     a. 1969 s/p MI;  b. S/P CABG 1987;  c. S/P redo 2008;  d. 07/2012 Cath: LM 95, LAD 100p, 95apical after graft insertion, D1 70, LCX 100p, VG->OM1 60d, VG->mLAD from VG->OM (Y graft) nl, VG->OM2->LPDA patent, LIMA->LAD known atresis, EF 40%; e. 02/2014 MV: anterior and posterolateral/apical infarct with mild peri-infarct ischemia->Med Rx.  . Peripheral vascular disease (HCC)     a. Carotid dz s/p L CEA, RAS, AAA, LE dz  . AAA (abdominal aortic aneurysm) (HCC)     a. Ectatic abdominal aorta with fusiform  dilitation 3.4x3.4 and moderate thrombus burden 12/2010  . RBBB   . Tachycardia     a. 10/2011 - atrial tach vs avnrt - BB started.  [name] COPD (chronic obstructive pulmonary disease) (HCC)   . Ventricular tachycardia (HCC)     a. 02/2015 ->amio 200 daily added.  . Gout   . Renal artery stenosis (HCC)     a. Dopplers 12/2010 - 1-59% R ostial renal artery stenosis, 2 L renal arteries both widely patent    Past Surgical History  Procedure Laterality Date  . Carotid endarterectomy Left 2010  . Colon resection  2008  . Tonsillectomy      as a child  . Pr vein bypass graft,aorto-fem-pop  10/1985  . Femoral endarterectomy Left  06/2011    ileofemoral endarterectomy with bovine patch angioplasty  . Endarterectomy  08/26/2011    Procedure: ENDARTERECTOMY ILIAC;  Surgeon: [name], MD;  Location: [facility] OR;  Service: Vascular;  Laterality: Right;  Right Femoral Artery Endarterectomy with vascu guard patch angioplasty & intraoperative arteriogram.  . Cardiac catheterization  07/28/2012    Native 3v CAD, continued patency of the SVG-OM1-LAD and SVG-OM2-left PDA. LVEF 40% with multiple WMAs  . Abdominal aortagram N/A 06/11/2011    Procedure: ABDOMINAL [name];  Surgeon: [name], MD;  Location: [facility] CATH LAB;  Service: Cardiovascular;  Laterality: N/A;  . Left heart catheterization with coronary angiogram N/A 07/28/2012  Procedure: LEFT HEART CATHETERIZATION WITH CORONARY ANGIOGRAM;  Surgeon: [name], MD;  Location: [facility] CATH LAB;  Service: Cardiovascular;  Laterality: N/A;  . Colon surgery    . Cataract extraction, bilateral    . Coronary artery bypass graft  10/31/85 & 08/19/06  . Coronary angioplasty    . Cardiac catheterization N/A 03/08/2015    Procedure: Left Heart Cath and Coronary Angiography;  Surgeon: [name], MD;  Location: [facility] INVASIVE CV LAB;  Service: Cardiovascular;  Laterality: N/A;  . Eye surgery      Current Outpatient Prescriptions  Medication Sig Dispense Refill  .  acetaminophen (TYLENOL) 500 MG tablet Take 1,000 mg by mouth every 6 (six) hours as needed for pain.    [name] aspirin 81 MG tablet Take 81 mg by mouth daily.     [name] atorvastatin (LIPITOR) 10 MG tablet Take 10 mg by mouth at bedtime.     . benzonatate (TESSALON) 100 MG capsule Take 100 mg by mouth 3 (three) times daily as needed for cough.     . clopidogrel (PLAVIX) 75 MG tablet Take 75 mg by mouth daily.     [name] COLCRYS 0.6 MG tablet Take 0.6 mg by mouth daily as needed (gout).     [name] ipratropium (ATROVENT) 0.03 % nasal spray Place 1 spray into the nose daily as needed.     . isosorbide mononitrate (IMDUR) 120 MG 24 hr tablet TAKE ONE TABLET BY MOUTH ONCE DAILY. 180 tablet 2  . losartan (COZAAR) 100 MG tablet Take 100 mg by mouth daily.    [name] mexiletine (MEXITIL) 200 MG capsule TAKE ONE CAPSULE BY MOUTH EVERY 12 HOURS 60 capsule 1  . Multiple Vitamins-Minerals (MULTIVITAMIN PO) Take 2 tablets by mouth daily.     . nitroGLYCERIN (NITROSTAT) 0.4 MG SL tablet Place 0.4 mg under the tongue every 5 (five) minutes as needed for chest pain.    [name] OVER THE COUNTER MEDICATION 1 scoop. Barley life otc medication by aim.    . pantoprazole (PROTONIX) 40 MG tablet TAKE ONE TABLET BY MOUTH ONCE DAILY 90 tablet 2  . ranolazine (RANEXA) 500 MG 12 hr tablet Take 2 tablets (1,000 mg total) by mouth 2 (two) times daily. 120 tablet 6   No current facility-administered medications for this visit.    Allergies  Allergen Reactions  . Novocain [Procaine Hcl] Other (See Comments)    Cold sweats and very bad headache.   . Metoprolol Hives, Itching and Rash      Review of Systems negative except from HPI and PMH  Physical Exam BP 147/82 mmHg  Pulse 82  Ht  (1.702 m)  Wt 137 lb (62.143 kg)  BMI 21.45 kg/m2 Well developed and well nourished in no acute distress HENT normal E scleral and icterus clear Neck Supple JVP flat; carotids brisk and full Clear to ausculation  Regular rate and rhythm, no murmurs  gallops or rub Soft with active bowel sounds No clubbing cyanosis  Edema Alert and oriented, grossly normal motor and sensory function Skin Warm and Dry  ECG demonstrates sinus rhythm at 84 Intervals 26/17/48 Axis -55 Bifascicular block   Assessment and  Plan  VEtnricular tachycardia  Coronary artery disease with depressed LV function EF 35%  Status post CABG   The patient has had no intercurrent symptomatic ventricular tachycardia. We will continue him on his current medications.

## 2015-07-18 NOTE — Patient Instructions (Signed)
Medication Instructions:  Your physician recommends that you continue on your current medications as directed. Please refer to the Current Medication list given to you today.  Labwork: None ordered  Testing/Procedures: None ordered  Follow-Up: Your physician recommends that you schedule a follow-up appointment in: 3 months with Dr. Excell Seltzer.  Your physician wants you to follow-up in: 6 months with Francis Dowse, PA.  You will receive a reminder letter in the mail two months in advance. If you don't receive a letter, please call our office to schedule the follow-up appointment.  Your physician wants you to follow-up in: 1 year with Dr. Graciela Husbands.  You will receive a reminder letter in the mail two months in advance. If you don't receive a letter, please call our office to schedule the follow-up appointment.    If you need a refill on your cardiac medications before your next appointment, please call your pharmacy.  Thank you for choosing [facility]!!

## 2015-07-26 ENCOUNTER — Observation Stay (HOSPITAL_COMMUNITY)
Admission: EM | Admit: 2015-07-26 | Discharge: 2015-07-28 | Disposition: A | Discharge status: Home / Self Care | Payer: Medicare PPO | Attending: Cardiovascular Disease | Admitting: Cardiovascular Disease

## 2015-07-26 ENCOUNTER — Encounter (HOSPITAL_COMMUNITY): Payer: Self-pay | Admitting: Emergency Medicine

## 2015-07-26 ENCOUNTER — Emergency Department (HOSPITAL_COMMUNITY): Payer: Medicare PPO

## 2015-07-26 DIAGNOSIS — I472 Ventricular tachycardia, unspecified: Secondary | ICD-10-CM

## 2015-07-26 DIAGNOSIS — I25110 Atherosclerotic heart disease of native coronary artery with unstable angina pectoris: Secondary | ICD-10-CM | POA: Insufficient documentation

## 2015-07-26 DIAGNOSIS — I255 Ischemic cardiomyopathy: Secondary | ICD-10-CM | POA: Diagnosis present

## 2015-07-26 DIAGNOSIS — E785 Hyperlipidemia, unspecified: Secondary | ICD-10-CM | POA: Diagnosis not present

## 2015-07-26 DIAGNOSIS — I6529 Occlusion and stenosis of unspecified carotid artery: Secondary | ICD-10-CM | POA: Insufficient documentation

## 2015-07-26 DIAGNOSIS — I200 Unstable angina: Secondary | ICD-10-CM | POA: Diagnosis not present

## 2015-07-26 DIAGNOSIS — J449 Chronic obstructive pulmonary disease, unspecified: Secondary | ICD-10-CM | POA: Insufficient documentation

## 2015-07-26 DIAGNOSIS — Z79899 Other long term (current) drug therapy: Secondary | ICD-10-CM | POA: Insufficient documentation

## 2015-07-26 DIAGNOSIS — I714 Abdominal aortic aneurysm, without rupture: Secondary | ICD-10-CM | POA: Diagnosis not present

## 2015-07-26 DIAGNOSIS — R079 Chest pain, unspecified: Secondary | ICD-10-CM | POA: Diagnosis not present

## 2015-07-26 DIAGNOSIS — Z7982 Long term (current) use of aspirin: Secondary | ICD-10-CM | POA: Insufficient documentation

## 2015-07-26 DIAGNOSIS — I739 Peripheral vascular disease, unspecified: Secondary | ICD-10-CM | POA: Diagnosis present

## 2015-07-26 DIAGNOSIS — I5022 Chronic systolic (congestive) heart failure: Secondary | ICD-10-CM | POA: Insufficient documentation

## 2015-07-26 DIAGNOSIS — R0789 Other chest pain: Secondary | ICD-10-CM

## 2015-07-26 DIAGNOSIS — I10 Essential (primary) hypertension: Secondary | ICD-10-CM | POA: Diagnosis not present

## 2015-07-26 DIAGNOSIS — R55 Syncope and collapse: Principal | ICD-10-CM | POA: Insufficient documentation

## 2015-07-26 DIAGNOSIS — Z951 Presence of aortocoronary bypass graft: Secondary | ICD-10-CM | POA: Diagnosis present

## 2015-07-26 DIAGNOSIS — Z7902 Long term (current) use of antithrombotics/antiplatelets: Secondary | ICD-10-CM | POA: Diagnosis not present

## 2015-07-26 DIAGNOSIS — M109 Gout, unspecified: Secondary | ICD-10-CM | POA: Insufficient documentation

## 2015-07-26 DIAGNOSIS — K5090 Crohn's disease, unspecified, without complications: Secondary | ICD-10-CM | POA: Insufficient documentation

## 2015-07-26 LAB — BASIC METABOLIC PANEL
Anion gap: 12 (ref 5–15)
BUN: 28 mg/dL — ABNORMAL HIGH (ref 6–20)
CO2: 25 mmol/L (ref 22–32)
Calcium: 9.4 mg/dL (ref 8.9–10.3)
Chloride: 105 mmol/L (ref 101–111)
Creatinine, Ser: 1.55 mg/dL — ABNORMAL HIGH (ref 0.61–1.24)
GFR calc Af Amer: 45 mL/min — ABNORMAL LOW (ref >60)
GFR calc non Af Amer: 39 mL/min — ABNORMAL LOW (ref >60)
Glucose, Bld: 137 mg/dL — ABNORMAL HIGH (ref 65–99)
Potassium: 4.5 mmol/L (ref 3.5–5.1)
Sodium: 142 mmol/L (ref 135–145)

## 2015-07-26 LAB — CBC WITH DIFFERENTIAL/PLATELET
Basophils Absolute: 0 10*3/uL (ref 0.0–0.1)
Basophils Relative: 0 %
Eosinophils Absolute: 0 10*3/uL (ref 0.0–0.7)
Eosinophils Relative: 0 %
HCT: 36.6 % — ABNORMAL LOW (ref 39.0–52.0)
Hemoglobin: 12.2 g/dL — ABNORMAL LOW (ref 13.0–17.0)
Lymphocytes Relative: 10 %
Lymphs Abs: 0.9 10*3/uL (ref 0.7–4.0)
MCH: 34.3 pg — ABNORMAL HIGH (ref 26.0–34.0)
MCHC: 33.3 g/dL (ref 30.0–36.0)
MCV: 102.8 fL — ABNORMAL HIGH (ref 78.0–100.0)
Monocytes Absolute: 0.5 10*3/uL (ref 0.1–1.0)
Monocytes Relative: 6 %
Neutro Abs: 7.8 10*3/uL — ABNORMAL HIGH (ref 1.7–7.7)
Neutrophils Relative %: 84 %
Platelets: 127 10*3/uL — ABNORMAL LOW (ref 150–400)
RBC: 3.56 MIL/uL — ABNORMAL LOW (ref 4.22–5.81)
RDW: 12.8 % (ref 11.5–15.5)
WBC: 9.3 10*3/uL (ref 4.0–10.5)

## 2015-07-26 LAB — TROPONIN I: Troponin I: 0.03 ng/mL (ref <0.031)

## 2015-07-26 NOTE — ED Notes (Signed)
Patient transported to X-ray 

## 2015-07-26 NOTE — H&P (Addendum)
Admit date: 07/26/2015 Referring Physician: Dr. Blinda Leatherwood Primary Cardiologist: Dr. Verlon Au Chief complaint/reason for admission:Chest pain  HPI: This is an 82yo male with a history of CAD with past CABG in 1987, with redo in 2008 and last cath in 02/2015 showing occluded ost to mid LCX, occluded LM, occluded ost to prox LAD, previously known small nondominant RCA was not injected,  patent SVG to OM3/OM4, patent SVG to LAD with diffuse disease distal to graft insertion, severe diffuse disease in free SVG graft to OM2 and prox graft insertion 100% stenosed. He has an ischemic DCM EF 35-40% by echo in 2012, PVD with Left CEA and mild to moderate left subclavian stenosis and known atretic LIMA graft. He has renal artery stenosis and AAA, chronic systolic CHF, HTN, dyslipidemia, RBBB and COPD.  He has had chronic stable angina for years. Other issues include bradycardia and tachycardia with a bifascicular block but has not required PTVP implant. He previously used prn propranolol if his heart rate is elevated and had not been maintained on any chronic beta blocker therapy.   Seen several times over the past year - has had some progressive angina - Myoview got updated and showed scar and only perhaps mild ischemia (per Dr. Jenene Slicker review) and no further testing indicated and medicines were adjusted. Had more issues with his heart rate as well - got put on daily dosing of Inderal.   Presented to the [facility] ED on 03/05/2015 for ventricular tachycardia in the setting of ischemic heart disease prior bypass surgery modest depression of LV function most recently 35-40%.  He received a cath at that time as stated above.  He has been managed initially on a combination of mexiletine/Amio and Ranexa and then Amio was discontinued.    She's been managed with a combination of mexiletine and amiodarone and ranolazine. Intercurrently the amiodarone has been discontinued.  He was just seen by Dr. Graciela Husbands  earlier this month in clinic and was doing well.    Tonight he presented to the ER with complaints of chest pain.  This afternoon after lunch he had sudden onset of CP that lasted until about 7pm when he decided to call EMS.  He took SL NTG at home with no relief According to his wife he passed out but the patient had no recollection of any dizziness or passing out and had been on the couch.  The other famly members think that he had just dose off to nap.  By the time he arrived in the ER his CP was gone.  EMS had given him another SL NTG en route.  He currently is asymptomatic.  Cardiology is now asked to admit patient for further evaluation.  He says that this is the first CP that he has had in a long while.  He denies any SOB, dizziness or palpitations.  No PND or orthopnea.  PMH:    Past Medical History  Diagnosis Date  . Hyperlipidemia   . Hypertension   . Carotid artery occlusion     a. s/p L CEA in 2010;  b. 06/2014 Carotid U/S: stable LICA CEA site w/o significant stenosis bilat.  . Crohn's disease (HCC)     a. s/p colon resection  . Bradycardia     a. previous intolerance to beta blockers - low dose toprol started 10/2011 for tachycardia but had rash and was discontinued;  b. tolerating low dose propranolol.  . Chronic systolic heart failure (HCC)     a. 02/2015  Echo:  35-40% (echo 11/2010); c. 02/2015 Echo: EF 20-25%, diff HK, mid-apicalanteroseptal and apical AK, Gr 1 DD, mild MR.  . Ischemic cardiomyopathy     a. EF 34% (Lexiscan 06/2011); b. 35-40% (echo 11/2010); c. 02/2015 Echo: EF 20-25%, diff HK, mid-apicalanteroseptal and apical AK, Gr 1 DD, mild MR.  . Coronary artery disease     a. 1969 s/p MI;  b. S/P CABG 1987;  c. S/P redo 2008;  d. 07/2012 Cath: LM 95, LAD 100p, 95apical after graft insertion, D1 70, LCX 100p, VG->OM1 60d, VG->mLAD from VG->OM (Y graft) nl, VG->OM2->LPDA patent, LIMA->LAD known atresis, EF 40%; e. 02/2014 MV: anterior and posterolateral/apical infarct with mild  peri-infarct ischemia->Med Rx.  . Peripheral vascular disease (HCC)     a. Carotid dz s/p L CEA, RAS, AAA, LE dz  . AAA (abdominal aortic aneurysm) (HCC)     a. Ectatic abdominal aorta with fusiform dilitation 3.4x3.4 and moderate thrombus burden 12/2010  . RBBB   . Tachycardia     a. 10/2011 - atrial tach vs avnrt - BB started.  [name] COPD (chronic obstructive pulmonary disease) (HCC)   . Ventricular tachycardia (HCC)     a. 02/2015 ->amio 200 daily added.  . Gout   . Renal artery stenosis (HCC)     a. Dopplers 12/2010 - 1-59% R ostial renal artery stenosis, 2 L renal arteries both widely patent    PSH:    Past Surgical History  Procedure Laterality Date  . Carotid endarterectomy Left 2010  . Colon resection  2008  . Tonsillectomy      as a child  . Pr vein bypass graft,aorto-fem-pop  10/1985  . Femoral endarterectomy Left  06/2011    ileofemoral endarterectomy with bovine patch angioplasty  . Endarterectomy  08/26/2011    Procedure: ENDARTERECTOMY ILIAC;  Surgeon: [name], MD;  Location: [facility] OR;  Service: Vascular;  Laterality: Right;  Right Femoral Artery Endarterectomy with vascu guard patch angioplasty & intraoperative arteriogram.  . Cardiac catheterization  07/28/2012    Native 3v CAD, continued patency of the SVG-OM1-LAD and SVG-OM2-left PDA. LVEF 40% with multiple WMAs  . Abdominal aortagram N/A 06/11/2011    Procedure: ABDOMINAL [name];  Surgeon: [name], MD;  Location: [facility] CATH LAB;  Service: Cardiovascular;  Laterality: N/A;  . Left heart catheterization with coronary angiogram N/A 07/28/2012    Procedure: LEFT HEART CATHETERIZATION WITH CORONARY ANGIOGRAM;  Surgeon: [name], MD;  Location: [facility] CATH LAB;  Service: Cardiovascular;  Laterality: N/A;  . Colon surgery    . Cataract extraction, bilateral    . Coronary artery bypass graft  10/31/85 & 08/19/06  . Coronary angioplasty    . Cardiac catheterization N/A 03/08/2015    Procedure: Left Heart Cath and Coronary  Angiography;  Surgeon: [name], MD;  Location: [facility] INVASIVE CV LAB;  Service: Cardiovascular;  Laterality: N/A;  . Eye surgery      ALLERGIES:   Novocain and Metoprolol  Prior to Admit Meds:   (Not in a hospital admission) Family HX:    Family History  Problem Relation Age of Onset  . Heart disease Father   . Cancer Mother   . COPD Mother   . Anesthesia problems Neg Hx   . Deep vein thrombosis Brother   . COPD Brother   . CAD Brother    Social HX:    Social History   Social History  . Marital Status: Married    Spouse Name: N/A  .  Number of Children: N/A  . Years of Education: N/A   Occupational History  . Not on file.   Social History Main Topics  . Smoking status: Never Smoker   . Smokeless tobacco: Never Used  . Alcohol Use: No  . Drug Use: No  . Sexual Activity: Not on file   Other Topics Concern  . Not on file   Social History Narrative     ROS:  All 11 ROS were addressed and are negative except what is stated in the HPI  PHYSICAL EXAM Filed Vitals:   07/26/15 2300 07/26/15 2330  BP: 131/72 145/83  Pulse: 72 78  Temp:    Resp: 14 22   General: Well developed, well nourished, in no acute distress Head: Eyes PERRLA, No xanthomas.   Normal cephalic and atramatic  Lungs:   Clear bilaterally to auscultation and percussion. Heart:   HRRR S1 S2 Pulses are 2+ & equal.            No carotid bruit. No JVD.  No abdominal bruits. No femoral bruits. Abdomen: Bowel sounds are positive, abdomen soft and non-tender without masses  Extremities:   No clubbing, cyanosis or edema.  DP +1 Neuro: Alert and oriented X 3. Psych:  Good affect, responds appropriately   Labs:   Lab Results  Component Value Date   WBC 9.3 07/26/2015   HGB 12.2* 07/26/2015   HCT 36.6* 07/26/2015   MCV 102.8* 07/26/2015   PLT 127* 07/26/2015    Recent Labs Lab 07/26/15 2241  NA 142  K 4.5  CL 105  CO2 25  BUN 28*  CREATININE 1.55*  CALCIUM 9.4  GLUCOSE 137*   Lab  Results  Component Value Date   CKTOTAL 46 10/18/2011   CKMB 3.3 10/18/2011   TROPONINI 0.03 07/26/2015   No results found for: PTT Lab Results  Component Value Date   INR 1.09 04/26/2015   INR 1.20 03/06/2015   INR 1.17 03/05/2015     Lab Results  Component Value Date   CHOL 99 03/06/2015   CHOL 102 01/09/2014   CHOL 87 10/18/2011   Lab Results  Component Value Date   HDL 30* 03/06/2015   HDL 33.30* 01/09/2014   HDL 36* 10/18/2011   Lab Results  Component Value Date   LDLCALC 41 03/06/2015   LDLCALC 48 01/09/2014   LDLCALC 34 10/18/2011   Lab Results  Component Value Date   TRIG 138 03/06/2015   TRIG 106.0 01/09/2014   TRIG 83 10/18/2011   Lab Results  Component Value Date   CHOLHDL 3.3 03/06/2015   CHOLHDL 3 01/09/2014   CHOLHDL 2.4 10/18/2011   No results found for: LDLDIRECT    Radiology:  Dg Chest 2 View  07/26/2015  CLINICAL DATA:  Sudden onset chest pain around 19:00. Subsequent syncopal episode after self administration of nitroglycerin. EXAM: CHEST  2 VIEW COMPARISON:  04/26/2015 FINDINGS: There is prior sternotomy and CABG. There is mild cardiomegaly and aortic tortuosity, unchanged. The lungs are clear. Pulmonary vasculature is normal. There is no pleural effusion. Hilar, mediastinal and cardiac contours are unremarkable and unchanged. Multiple remote healed fracture deformities are present involving several left ribs. IMPRESSION: Unchanged mild cardiomegaly and aortic tortuosity. No acute cardiopulmonary findings. Electronically Signed   By: Ellery Plunk M.D.   On: 07/26/2015 22:17    EKG:  NSR with nonspecific IVCD unchanged from 04/26/2015  ASSESSMENT/PLAN:   1.  Chest pain starting this afternoon and now resolved after  a total of 4 SL NTG.  Patient has a long complicated history of CAD and last cath in 02/2015 as stated above on medical management.  Troponin negative x 1 and EKG unchanged from prior EKG in December 2016.  Currently pain free.    Will admit to telemetry.  Cycle cardiac enzymes.  Given mildly reduced platelet ct, will hold on IV Heparin gtt for now unless he has recurrent CP or enzymes become positive.  Not many options left for treatment of angina.  He is on max dose Ranexa.  Cannot use BB due to conduction system disease.  He is on max dose long acting nitrates.   2. ? Syncope vs dosing off.  Wife thinks he passed out but patient denies any dizziness, syncope or palpitations.He has had syncope in the setting of VT in the past but dosent sound like he really passed out according to other family members.   Monitor on Tele.   3 . Severe 3 vessel ASCAD s/p CABG with last cath showing patent SVG to OM3/OM4, patent SVG to LAD with diffuse disease distal to graft insertion, severe diffuse disease in free SVG to OM2 and prox graft insertion 100% occluded on medical management.  Continue ASA/statin/Plavix/Long acting nitrates and ranexa.    4.  Ischemic DCM EF 35-40% - no BB secondary to conduction system disease.  Continue ARB.    5.  Ventricular tachycardia which has been controlled on Mexiletene and Ranexa.  Plan was to avoid nodal blocking agents for BP control given conduction disease and bradycardia noted during prior hospital stay.  Will continue current antiarrhythmic therapy.    6.  HTN - controlled on ARB.    7.  Tachy/brady syndrome with bifascicular block in setting of syncope off all AV Nodal blocking agents.    Quintella Reichert, MD  07/26/2015  11:58 PM

## 2015-07-26 NOTE — ED Notes (Signed)
Patient arrived via [facility]. EMS reports: patient reported sudden onset of chest pain approx 1900. Patient lives at home. EMS called at approx 2015. Also, reports syncopal episode following self administration of NTG x 1. LOC reported by spouse. Denies head injury from syncopal episode. Patient also took ASA 324 mg independently. EMS administered NTG x 1 and Zofran 4 mg IV. EKG - RBBB noted. BP 144[address], Pulse 74, 92-93% on room air, 98% on 2 LPM via nasal cannula. CBG 120. Chest pain 8/10 initially - 5/10 after NTG administration.

## 2015-07-26 NOTE — ED Notes (Signed)
Dr. Blinda Leatherwood at bedside at this time with patient/family.

## 2015-07-26 NOTE — ED Provider Notes (Signed)
CSN: [id number]     Arrival date & time 07/26/15  2119 History  By signing my name below, I, Karl Stevenson, attest that this documentation has been prepared under the direction and in the presence of [name], MD. Electronically Signed: [name], ED Scribe. 07/26/2015. 11:24 PM.     Chief Complaint  Patient presents with  . Chest Pain   Patient is a 82 y.o. male presenting with chest pain. The history is provided by the patient. No language interpreter was used.  Chest Pain  HPI Comments: Karl Stevenson is a 82 y.o. male with PMHx listed below brought in by ambulance, who presents to the Emergency Department complaining of sudden CP onset this afternoon. Per Ems Pt tried nitroglycerin at home with no relief that caused him to have syncopal episode. Pt reports that the CP has resolved since being in the ED. He states that he received nitroglycerin in route with relief. Pt also tried 324 ASA PTA. He states that he initially though the pain was due to stress. Pt doesn't report any other symptoms at this time.     Past Medical History  Diagnosis Date  . Hyperlipidemia   . Hypertension   . Carotid artery occlusion     a. s/p L CEA in 2010;  b. 06/2014 Carotid U/S: stable LICA CEA site w/o significant stenosis bilat.  . Crohn's disease (HCC)     a. s/p colon resection  . Bradycardia     a. previous intolerance to beta blockers - low dose toprol started 10/2011 for tachycardia but had rash and was discontinued;  b. tolerating low dose propranolol.  . Chronic systolic heart failure (HCC)     a. 02/2015 Echo:  35-40% (echo 11/2010); c. 02/2015 Echo: EF 20-25%, diff HK, mid-apicalanteroseptal and apical AK, Gr 1 DD, mild MR.  . Ischemic cardiomyopathy     a. EF 34% (Lexiscan 06/2011); b. 35-40% (echo 11/2010); c. 02/2015 Echo: EF 20-25%, diff HK, mid-apicalanteroseptal and apical AK, Gr 1 DD, mild MR.  . Coronary artery disease     a. 1969 s/p MI;  b. S/P CABG 1987;  c. S/P redo  2008;  d. 07/2012 Cath: LM 95, LAD 100p, 95apical after graft insertion, D1 70, LCX 100p, VG->OM1 60d, VG->mLAD from VG->OM (Y graft) nl, VG->OM2->LPDA patent, LIMA->LAD known atresis, EF 40%; e. 02/2014 MV: anterior and posterolateral/apical infarct with mild peri-infarct ischemia->Med Rx.  . Peripheral vascular disease (HCC)     a. Carotid dz s/p L CEA, RAS, AAA, LE dz  . AAA (abdominal aortic aneurysm) (HCC)     a. Ectatic abdominal aorta with fusiform dilitation 3.4x3.4 and moderate thrombus burden 12/2010  . RBBB   . Tachycardia     a. 10/2011 - atrial tach vs avnrt - BB started.  [name] COPD (chronic obstructive pulmonary disease) (HCC)   . Ventricular tachycardia (HCC)     a. 02/2015 ->amio 200 daily added.  . Gout   . Renal artery stenosis (HCC)     a. Dopplers 12/2010 - 1-59% R ostial renal artery stenosis, 2 L renal arteries both widely patent   Past Surgical History  Procedure Laterality Date  . Carotid endarterectomy Left 2010  . Colon resection  2008  . Tonsillectomy      as a child  . Pr vein bypass graft,aorto-fem-pop  10/1985  . Femoral endarterectomy Left  06/2011    ileofemoral endarterectomy with bovine patch angioplasty  . Endarterectomy  08/26/2011  Procedure: ENDARTERECTOMY ILIAC;  Surgeon: [name], MD;  Location: [facility] OR;  Service: Vascular;  Laterality: Right;  Right Femoral Artery Endarterectomy with vascu guard patch angioplasty & intraoperative arteriogram.  . Cardiac catheterization  07/28/2012    Native 3v CAD, continued patency of the SVG-OM1-LAD and SVG-OM2-left PDA. LVEF 40% with multiple WMAs  . Abdominal aortagram N/A 06/11/2011    Procedure: ABDOMINAL [name];  Surgeon: [name], MD;  Location: [facility] CATH LAB;  Service: Cardiovascular;  Laterality: N/A;  . Left heart catheterization with coronary angiogram N/A 07/28/2012    Procedure: LEFT HEART CATHETERIZATION WITH CORONARY ANGIOGRAM;  Surgeon: [name], MD;  Location[facility] CATH LAB;  Service:  Cardiovascular;  Laterality: N/A;  . Colon surgery    . Cataract extraction, bilateral    . Coronary artery bypass graft  10/31/85 & 08/19/06  . Coronary angioplasty    . Cardiac catheterization N/A 03/08/2015    Procedure: Left Heart Cath and Coronary Angiography;  Surgeon: [name], MD;  Location: [facility] INVASIVE CV LAB;  Service: Cardiovascular;  Laterality: N/A;  . Eye surgery     Family History  Problem Relation Age of Onset  . Heart disease Father   . Cancer Mother   . COPD Mother   . Anesthesia problems Neg Hx   . Deep vein thrombosis Brother   . COPD Brother   . CAD Brother    Social History  Substance Use Topics  . Smoking status: Never Smoker   . Smokeless tobacco: Never Used  . Alcohol Use: No    Review of Systems  Cardiovascular: Positive for chest pain.  All other systems reviewed and are negative.    Allergies  Novocain and Metoprolol  Home Medications   Prior to Admission medications   Medication Sig Start Date End Date Taking? Authorizing Provider  acetaminophen (TYLENOL) 500 MG tablet Take 1,000 mg by mouth every 6 (six) hours as needed for pain.   Yes Historical Provider, MD  aspirin 81 MG tablet Take 81 mg by mouth daily.    Yes Historical Provider, MD  atorvastatin (LIPITOR) 10 MG tablet Take 10 mg by mouth at bedtime.    Yes Historical Provider, MD  clopidogrel (PLAVIX) 75 MG tablet Take 75 mg by mouth daily.  01/09/14  Yes Historical Provider, MD  COLCRYS 0.6 MG tablet Take 0.6 mg by mouth daily as needed (gout).  04/17/11  Yes Historical Provider, MD  ipratropium (ATROVENT) 0.03 % nasal spray Place 1 spray into the nose daily.    Yes Historical Provider, MD  isosorbide mononitrate (IMDUR) 120 MG 24 hr tablet TAKE ONE TABLET BY MOUTH ONCE DAILY. 03/04/15  Yes [name], MD  losartan (COZAAR) 100 MG tablet Take 100 mg by mouth daily.   Yes Historical Provider, MD  mexiletine (MEXITIL) 200 MG capsule TAKE ONE CAPSULE BY MOUTH EVERY 12 HOURS  07/01/15  Yes Duke Salvia, MD  Multiple Vitamins-Minerals (MULTIVITAMIN PO) Take 2 tablets by mouth daily.    Yes Historical Provider, MD  nitroGLYCERIN (NITROSTAT) 0.4 MG SL tablet Place 0.4 mg under the tongue every 5 (five) minutes as needed for chest pain.   Yes Historical Provider, MD  OVER THE COUNTER MEDICATION Take 1 scoop by mouth daily. Barley life otc medication by aim.   Yes Historical Provider, MD  pantoprazole (PROTONIX) 40 MG tablet TAKE ONE TABLET BY MOUTH ONCE DAILY 04/09/15  Yes [name], MD  ranolazine (RANEXA) 500 MG 12 hr tablet Take 2  tablets (1,000 mg total) by mouth 2 (two) times daily. 05/07/15  Yes [name], MD   BP 136/66 mmHg  Pulse 73  Temp(Src) 97.4 F (36.3 C) (Oral)  Resp 16  Ht 5\' 7"  (1.702 m)  Wt 135 lb (61.236 kg)  BMI 21.14 kg/m2  SpO2 98%   Physical Exam  Constitutional: He is oriented to person, place, and time. He appears well-developed and well-nourished. No distress.  HENT:  Head: Normocephalic and atraumatic.  Right Ear: Hearing normal.  Left Ear: Hearing normal.  Nose: Nose normal.  Mouth/Throat: Oropharynx is clear and moist and mucous membranes are normal.  Eyes: Conjunctivae and EOM are normal. Pupils are equal, round, and reactive to light.  Neck: Normal range of motion. Neck supple.  Cardiovascular: Regular rhythm, S1 normal and S2 normal.  Exam reveals no gallop and no friction rub.   No murmur heard. Pulmonary/Chest: Effort normal and breath sounds normal. No respiratory distress. He exhibits no tenderness.  Abdominal: Soft. Normal appearance and bowel sounds are normal. There is no hepatosplenomegaly. There is no tenderness. There is no rebound, no guarding, no tenderness at McBurney's point and negative Murphy's sign. No hernia.  Musculoskeletal: Normal range of motion.  Neurological: He is alert and oriented to person, place, and time. He has normal strength. No cranial nerve deficit or sensory deficit. Coordination  normal. GCS eye subscore is 4. GCS verbal subscore is 5. GCS motor subscore is 6.  Skin: Skin is warm, dry and intact. No rash noted. No cyanosis.  Psychiatric: He has a normal mood and affect. His speech is normal and behavior is normal. Thought content normal.  Nursing note and vitals reviewed.   ED Course  Procedures (including critical care time) DIAGNOSTIC STUDIES: Oxygen Saturation is 98% on RA, normal by my interpretation.    COORDINATION OF CARE: 11:12 PM-Discussed treatment plan with pt at bedside and pt agreed to plan.    Labs Review Labs Reviewed  CBC WITH DIFFERENTIAL/PLATELET - Abnormal; Notable for the following:    RBC 3.56 (*)    Hemoglobin 12.2 (*)    HCT 36.6 (*)    MCV 102.8 (*)    MCH 34.3 (*)    Platelets 127 (*)    Neutro Abs 7.8 (*)    All other components within normal limits  BASIC METABOLIC PANEL  TROPONIN I    Imaging Review Dg Chest 2 View  07/26/2015  CLINICAL DATA:  Sudden onset chest pain around 19:00. Subsequent syncopal episode after self administration of nitroglycerin. EXAM: CHEST  2 VIEW COMPARISON:  04/26/2015 FINDINGS: There is prior sternotomy and CABG. There is mild cardiomegaly and aortic tortuosity, unchanged. The lungs are clear. Pulmonary vasculature is normal. There is no pleural effusion. Hilar, mediastinal and cardiac contours are unremarkable and unchanged. Multiple remote healed fracture deformities are present involving several left ribs. IMPRESSION: Unchanged mild cardiomegaly and aortic tortuosity. No acute cardiopulmonary findings. Electronically Signed   By: Ellery Plunk M.D.   On: 07/26/2015 22:17   I have personally reviewed and evaluated these images and lab results as part of my medical decision-making.   EKG Interpretation   Date/Time:  Friday July 26 2015 21:23:55 EDT Ventricular Rate:  72 PR Interval:  222 QRS Duration: 177 QT Interval:  495 QTC Calculation: 542 R Axis:   -74 Text Interpretation:  Sinus  rhythm Prolonged PR interval Probable left  atrial enlargement Nonspecific IVCD with LAD No significant change since  last tracing Confirmed by [facility]  MD, Cristal Deer [phone]) on 07/26/2015  11:00:57 PM      MDM   Final diagnoses:  None  Chest Pain Syncope  Patient presents to the ER by ambulance for chest pain. Patient has a history of significant coronary artery disease, status post bypass surgery in 1987 and then again in 2008. He has a history of ischemic cardiomyopathy. He has had multiple arrhythmias including ventricular tachycardia in the past. Patient had cardiac catheterization last year with following results:  Impression:  Known severe native coronary disease. Disabled contrast the native arteries were not evaluated.  Occluded old SVG-OM1 - the dye remained standing in the proximal vessel for several minutes following angiography - not PCI amenable  This is the likely culprit for the patient's episode of what sounds like anginal symptoms about a month ago. This may also be a cause of now worsening ventricular tachycardia.   Widely patent Y graft with one limb providing sequential grafting to OM1 and OM 2 as well as a separate limb serving as a graft to the LAD which retrograde fills the diagonal Stevenson. This redo graft now provides the entirety of the patient's coronary circulation.  The graft obtuse marginal branches are relatively free of disease with retrograde filling back to the distal circumflex system which consists of the posterolateral Stevenson and left PDA.  The grafted LAD itself was a very diffusely diseased vessel, but no change from previous.  Known small nondominant right coronary artery system.  Borderline low LVEDP  Patient had onset of pain this afternoon that was present for several hours. He attempted to take nitroglycerin at home and did not have any improvement in his pain. Family reports that he did have a syncopal episode sometime after taking the  nitroglycerin. This was likely secondary to hypotension, but given his history of significant arrhythmias, this needs to be considered. Patient reports that he has had resolution of the pain after being given nitroglycerin by EMS during transport. He is currently pain-free. Workup thus far is negative. EKG shows nonspecific IVCD, similar to previous. First troponin negative. Based on his significant cardiac history and known coronary artery disease with associated angina, will require hospitalization for further management.  I personally performed the services described in this documentation, which was scribed in my presence. The recorded information has been reviewed and is accurate.      [name], MD 07/27/15 0001

## 2015-07-27 DIAGNOSIS — I255 Ischemic cardiomyopathy: Secondary | ICD-10-CM

## 2015-07-27 DIAGNOSIS — I2510 Atherosclerotic heart disease of native coronary artery without angina pectoris: Secondary | ICD-10-CM

## 2015-07-27 DIAGNOSIS — I10 Essential (primary) hypertension: Secondary | ICD-10-CM

## 2015-07-27 DIAGNOSIS — R072 Precordial pain: Secondary | ICD-10-CM

## 2015-07-27 DIAGNOSIS — R079 Chest pain, unspecified: Secondary | ICD-10-CM

## 2015-07-27 LAB — CBC WITH DIFFERENTIAL/PLATELET
BASOS ABS: 0 10*3/uL (ref 0.0–0.1)
BASOS PCT: 0 %
EOS PCT: 0 %
Eosinophils Absolute: 0 10*3/uL (ref 0.0–0.7)
HCT: 38.3 % — ABNORMAL LOW (ref 39.0–52.0)
Hemoglobin: 12.6 g/dL — ABNORMAL LOW (ref 13.0–17.0)
LYMPHS PCT: 14 %
Lymphs Abs: 1.1 10*3/uL (ref 0.7–4.0)
MCH: 33.9 pg (ref 26.0–34.0)
MCHC: 32.9 g/dL (ref 30.0–36.0)
MCV: 103 fL — AB (ref 78.0–100.0)
Monocytes Absolute: 0.7 10*3/uL (ref 0.1–1.0)
Monocytes Relative: 8 %
Neutro Abs: 6.3 10*3/uL (ref 1.7–7.7)
Neutrophils Relative %: 78 %
PLATELETS: 135 10*3/uL — AB (ref 150–400)
RBC: 3.72 MIL/uL — AB (ref 4.22–5.81)
RDW: 12.8 % (ref 11.5–15.5)
WBC: 8.1 10*3/uL (ref 4.0–10.5)

## 2015-07-27 LAB — COMPREHENSIVE METABOLIC PANEL
ALT: 18 U/L (ref 17–63)
AST: 26 U/L (ref 15–41)
Albumin: 3.9 g/dL (ref 3.5–5.0)
Alkaline Phosphatase: 64 U/L (ref 38–126)
Anion gap: 13 (ref 5–15)
BUN: 27 mg/dL — AB (ref 6–20)
CHLORIDE: 105 mmol/L (ref 101–111)
CO2: 27 mmol/L (ref 22–32)
CREATININE: 1.62 mg/dL — AB (ref 0.61–1.24)
Calcium: 9.7 mg/dL (ref 8.9–10.3)
GFR calc non Af Amer: 37 mL/min — ABNORMAL LOW (ref >60)
GFR, EST AFRICAN AMERICAN: 43 mL/min — AB (ref >60)
Glucose, Bld: 117 mg/dL — ABNORMAL HIGH (ref 65–99)
Potassium: 4.2 mmol/L (ref 3.5–5.1)
SODIUM: 145 mmol/L (ref 135–145)
Total Bilirubin: 0.6 mg/dL (ref 0.3–1.2)
Total Protein: 6.3 g/dL — ABNORMAL LOW (ref 6.5–8.1)

## 2015-07-27 LAB — TROPONIN I
TROPONIN I: 0.08 ng/mL — AB (ref <0.031)
Troponin I: 0.03 ng/mL (ref <0.031)
Troponin I: 0.04 ng/mL — ABNORMAL HIGH (ref <0.031)

## 2015-07-27 LAB — MAGNESIUM: Magnesium: 1.7 mg/dL (ref 1.7–2.4)

## 2015-07-27 LAB — TSH: TSH: 3.025 u[IU]/mL (ref 0.350–4.500)

## 2015-07-27 LAB — PROTIME-INR
INR: 1.2 (ref 0.00–1.49)
PROTHROMBIN TIME: 15.3 s — AB (ref 11.6–15.2)

## 2015-07-27 LAB — APTT: APTT: 33 s (ref 24–37)

## 2015-07-27 MED ORDER — LOSARTAN POTASSIUM 50 MG PO TABS
100.0000 mg | ORAL_TABLET | Freq: Every day | ORAL | Status: DC
  Administered 2015-07-27 – 2015-07-28 (×2): 100 mg via ORAL
  Filled 2015-07-27 (×2): qty 2

## 2015-07-27 MED ORDER — ACETAMINOPHEN 500 MG PO TABS: 1000.0000 mg | ORAL_TABLET | Freq: Four times a day (QID) | ORAL | Status: DC | PRN

## 2015-07-27 MED ORDER — PANTOPRAZOLE SODIUM 40 MG PO TBEC
40.0000 mg | DELAYED_RELEASE_TABLET | Freq: Every day | ORAL | Status: DC
  Administered 2015-07-27 – 2015-07-28 (×2): 40 mg via ORAL
  Filled 2015-07-27 (×2): qty 1

## 2015-07-27 MED ORDER — IPRATROPIUM BROMIDE 0.06 % NA SOLN
1.0000 | Freq: Every day | NASAL | Status: DC
  Administered 2015-07-27 – 2015-07-28 (×2): 1 via NASAL
  Filled 2015-07-27: qty 30
  Filled 2015-07-27: qty 15

## 2015-07-27 MED ORDER — CLOPIDOGREL BISULFATE 75 MG PO TABS
75.0000 mg | ORAL_TABLET | Freq: Every day | ORAL | Status: DC
  Administered 2015-07-27 – 2015-07-28 (×2): 75 mg via ORAL
  Filled 2015-07-27 (×2): qty 1

## 2015-07-27 MED ORDER — ASPIRIN EC 81 MG PO TBEC
81.0000 mg | DELAYED_RELEASE_TABLET | Freq: Every day | ORAL | Status: DC
  Administered 2015-07-27 – 2015-07-28 (×2): 81 mg via ORAL
  Filled 2015-07-27 (×2): qty 1

## 2015-07-27 MED ORDER — ISOSORBIDE MONONITRATE ER 60 MG PO TB24
120.0000 mg | ORAL_TABLET | Freq: Every day | ORAL | Status: DC
  Administered 2015-07-27: 120 mg via ORAL
  Filled 2015-07-27 (×3): qty 2

## 2015-07-27 MED ORDER — NITROGLYCERIN 0.4 MG SL SUBL
0.4000 mg | SUBLINGUAL_TABLET | SUBLINGUAL | Status: DC | PRN
Start: 2015-07-27 — End: 2015-07-28

## 2015-07-27 MED ORDER — ISOSORBIDE MONONITRATE ER 60 MG PO TB24
180.0000 mg | ORAL_TABLET | Freq: Every day | ORAL | Status: DC
  Administered 2015-07-28: 180 mg via ORAL
  Filled 2015-07-27: qty 3

## 2015-07-27 MED ORDER — ONDANSETRON HCL 4 MG/2ML IJ SOLN
4.0000 mg | Freq: Four times a day (QID) | INTRAMUSCULAR | Status: DC | PRN
  Administered 2015-07-27: 4 mg via INTRAVENOUS
  Filled 2015-07-27: qty 2

## 2015-07-27 MED ORDER — MEXILETINE HCL 200 MG PO CAPS
200.0000 mg | ORAL_CAPSULE | Freq: Two times a day (BID) | ORAL | Status: DC
  Administered 2015-07-27 – 2015-07-28 (×3): 200 mg via ORAL
  Filled 2015-07-27 (×5): qty 1

## 2015-07-27 MED ORDER — ATORVASTATIN CALCIUM 10 MG PO TABS
10.0000 mg | ORAL_TABLET | Freq: Every day | ORAL | Status: DC
  Administered 2015-07-27: 10 mg via ORAL
  Filled 2015-07-27: qty 1

## 2015-07-27 MED ORDER — RANOLAZINE ER 500 MG PO TB12
1000.0000 mg | ORAL_TABLET | Freq: Two times a day (BID) | ORAL | Status: DC
  Administered 2015-07-27 – 2015-07-28 (×3): 1000 mg via ORAL
  Filled 2015-07-27 (×4): qty 2

## 2015-07-27 NOTE — ED Notes (Signed)
Cardiology at bedside at this time

## 2015-07-27 NOTE — Progress Notes (Signed)
SUBJECTIVE:  No further chest pain.  Mild nausea this morning.     PHYSICAL EXAM Filed Vitals:   07/27/15 1112 07/27/15 1130 07/27/15 1200 07/27/15 1326  BP: 126/81 116/77 102/62 130/69  Pulse: 126 103 100 91  Temp:    98.1 F (36.7 C)  TempSrc:    Oral  Resp: 18 17 19 18   Height:    5\' 7"  (1.702 m)  Weight:    128 lb 12.8 oz (58.423 kg)  SpO2: 100% 100% 97% 100%   General:  No acute distress Lungs:  Clear Heart:  RRR Abdomen:  Positive bowel sounds, no rebound no guarding Extremities:  No edema   LABS: Lab Results  Component Value Date   TROPONINI 0.04* 07/27/2015   Results for orders placed or performed during the hospital encounter of 07/26/15 (from the past 24 hour(s))  CBC with Differential     Status: Abnormal   Collection Time: 07/26/15 10:41 PM  Result Value Ref Range   WBC 9.3 4.0 - 10.5 K/uL   RBC 3.56 (L) 4.22 - 5.81 MIL/uL   Hemoglobin 12.2 (L) 13.0 - 17.0 g/dL   HCT 85.2 (L) 77.8 - 24.2 %   MCV 102.8 (H) 78.0 - 100.0 fL   MCH 34.3 (H) 26.0 - 34.0 pg   MCHC 33.3 30.0 - 36.0 g/dL   RDW 35.3 61.4 - 43.1 %   Platelets 127 (L) 150 - 400 K/uL   Neutrophils Relative % 84 %   Neutro Abs 7.8 (H) 1.7 - 7.7 K/uL   Lymphocytes Relative 10 %   Lymphs Abs 0.9 0.7 - 4.0 K/uL   Monocytes Relative 6 %   Monocytes Absolute 0.5 0.1 - 1.0 K/uL   Eosinophils Relative 0 %   Eosinophils Absolute 0.0 0.0 - 0.7 K/uL   Basophils Relative 0 %   Basophils Absolute 0.0 0.0 - 0.1 K/uL  Basic metabolic panel     Status: Abnormal   Collection Time: 07/26/15 10:41 PM  Result Value Ref Range   Sodium 142 135 - 145 mmol/L   Potassium 4.5 3.5 - 5.1 mmol/L   Chloride 105 101 - 111 mmol/L   CO2 25 22 - 32 mmol/L   Glucose, Bld 137 (H) 65 - 99 mg/dL   BUN 28 (H) 6 - 20 mg/dL   Creatinine, Ser 5.40 (H) 0.61 - 1.24 mg/dL   Calcium 9.4 8.9 - 08.6 mg/dL   GFR calc non Af Amer 39 (L) >60 mL/min   GFR calc Af Amer 45 (L) >60 mL/min   Anion gap 12 5 - 15  Troponin I     Status:  None   Collection Time: 07/26/15 10:41 PM  Result Value Ref Range   Troponin I 0.03 <0.031 ng/mL  Comprehensive metabolic panel     Status: Abnormal   Collection Time: 07/27/15  2:56 AM  Result Value Ref Range   Sodium 145 135 - 145 mmol/L   Potassium 4.2 3.5 - 5.1 mmol/L   Chloride 105 101 - 111 mmol/L   CO2 27 22 - 32 mmol/L   Glucose, Bld 117 (H) 65 - 99 mg/dL   BUN 27 (H) 6 - 20 mg/dL   Creatinine, Ser 7.61 (H) 0.61 - 1.24 mg/dL   Calcium 9.7 8.9 - 95.0 mg/dL   Total Protein 6.3 (L) 6.5 - 8.1 g/dL   Albumin 3.9 3.5 - 5.0 g/dL   AST 26 15 - 41 U/L   ALT 18 17 - 63  U/L   Alkaline Phosphatase 64 38 - 126 U/L   Total Bilirubin 0.6 0.3 - 1.2 mg/dL   GFR calc non Af Amer 37 (L) >60 mL/min   GFR calc Af Amer 43 (L) >60 mL/min   Anion gap 13 5 - 15  Magnesium     Status: None   Collection Time: 07/27/15  2:56 AM  Result Value Ref Range   Magnesium 1.7 1.7 - 2.4 mg/dL  TSH     Status: None   Collection Time: 07/27/15  2:56 AM  Result Value Ref Range   TSH 3.025 0.350 - 4.500 uIU/mL  Troponin I     Status: None   Collection Time: 07/27/15  2:56 AM  Result Value Ref Range   Troponin I 0.03 <0.031 ng/mL  CBC WITH DIFFERENTIAL     Status: Abnormal   Collection Time: 07/27/15  2:56 AM  Result Value Ref Range   WBC 8.1 4.0 - 10.5 K/uL   RBC 3.72 (L) 4.22 - 5.81 MIL/uL   Hemoglobin 12.6 (L) 13.0 - 17.0 g/dL   HCT 40.9 (L) 81.1 - 91.4 %   MCV 103.0 (H) 78.0 - 100.0 fL   MCH 33.9 26.0 - 34.0 pg   MCHC 32.9 30.0 - 36.0 g/dL   RDW 78.2 95.6 - 21.3 %   Platelets 135 (L) 150 - 400 K/uL   Neutrophils Relative % 78 %   Neutro Abs 6.3 1.7 - 7.7 K/uL   Lymphocytes Relative 14 %   Lymphs Abs 1.1 0.7 - 4.0 K/uL   Monocytes Relative 8 %   Monocytes Absolute 0.7 0.1 - 1.0 K/uL   Eosinophils Relative 0 %   Eosinophils Absolute 0.0 0.0 - 0.7 K/uL   Basophils Relative 0 %   Basophils Absolute 0.0 0.0 - 0.1 K/uL  Troponin I     Status: Abnormal   Collection Time: 07/27/15  8:39 AM    Result Value Ref Range   Troponin I 0.04 (H) <0.031 ng/mL   No intake or output data in the 24 hours ending 07/27/15 1526   ASSESSMENT AND PLAN:  CHEST PAIN:    No chest pain since admission.  Nausea this AM.   I don't think that his very slight troponin elevation is clinically significant.    QUESTIONABLE LOC:  No clear LOC.  No significant arrhythmias.  ISCHEMIC CM:   Euvolemic.    HTN:    BP stable since admit.  Hypertensive episodes are either the primary event or secondary.  Increase Imdur.  Might need PRN hydralazine at home.    Roney Youtz 07/27/2015 3:26 PM

## 2015-07-28 ENCOUNTER — Encounter (HOSPITAL_COMMUNITY): Payer: Self-pay | Admitting: Nurse Practitioner

## 2015-07-28 DIAGNOSIS — R0789 Other chest pain: Secondary | ICD-10-CM

## 2015-07-28 DIAGNOSIS — R072 Precordial pain: Secondary | ICD-10-CM | POA: Diagnosis not present

## 2015-07-28 MED ORDER — ISOSORBIDE MONONITRATE ER 60 MG PO TB24: 60.0000 mg | ORAL_TABLET | Freq: Every day | ORAL | Status: DC

## 2015-07-28 NOTE — Discharge Summary (Signed)
Discharge Summary   Patient ID: Karl Stevenson,  MRN: [id number], DOB/AGE: 1926/09/08 82 y.o.  Admit date: 07/26/2015 Discharge date: 07/28/2015  Primary Care Provider: Garlan Fillers Primary Cardiologist: Judie Petit. Excell Seltzer, MD / S. Graciela Husbands, MD   Discharge Diagnoses    Principal Problem:   Midsternal chest pain/elevated troponin  Active Problems:   CAD (coronary artery disease)   Ventricular tachycardia (HCC)   HTN (hypertension)   Ischemic cardiomyopathy   Chronic systolic CHF   PVD (peripheral vascular disease) (HCC)  Allergies Allergies  Allergen Reactions  . Novocain [Procaine Hcl] Other (See Comments)    Cold sweats and very bad headache.   . Metoprolol Hives, Itching and Rash    Diagnostic Studies/Procedures    None _____________   History of Present Illness  82 y/o ? with a h/o CAD s/p CABG, ICM, chronic systolic CHF, HTN, and PVD who was in his usual state of health until 07/26/2015 when he had sudden onset of substernal chest pain that was unrelieved by sl NTG.  He called EMS and was taken to the [facility] ED where ECG was non-acute and c/p free.  He had received another sl NTG en route. His wife also reported and episode where she thought that he may have lost consciousness while lying on the cough.  The patient and other family members felt that he more likely dozed off.  Troponin was normal.  He was admitted for further evaluation.  Hospital Course   Consultants: None   Mr. Demore had no further chest pain.  His troponin did rise to 0.08 however this was not felt to represent ACS as his trend remained flat.  In light of recent catheterization in 02/2015, decision was made to continue medical therapy including ASA, plavix, statin, and nitrate, and his nitrate dose was titrated to 180 mg daily.  He has since been ambulating without recurrent symptoms and will be discharged home today in good condition.  Notably, he did not have any significant arrhythmias on the monitor  and no further work-up is planned at this point for questionable syncope vs falling asleep prior to admission. _____________  Discharge Vitals Blood pressure 150/80, pulse 78, temperature 97.8 F (36.6 C), temperature source Oral, resp. rate 14, height 5\' 7"  (1.702 m), weight 129 lb 9.6 oz (58.786 kg), SpO2 98 %.  Filed Weights   07/26/15 2127 07/27/15 1326 07/28/15 0430  Weight: 135 lb (61.236 kg) 128 lb 12.8 oz (58.423 kg) 129 lb 9.6 oz (58.786 kg)    Labs     CBC  Recent Labs  07/26/15 2241 07/27/15 0256  WBC 9.3 8.1  NEUTROABS 7.8* 6.3  HGB 12.2* 12.6*  HCT 36.6* 38.3*  MCV 102.8* 103.0*  PLT 127* 135*   Basic Metabolic Panel  Recent Labs  07/26/15 2241 07/27/15 0256  NA 142 145  K 4.5 4.2  CL 105 105  CO2 25 27  GLUCOSE 137* 117*  BUN 28* 27*  CREATININE 1.55* 1.62*  CALCIUM 9.4 9.7  MG  --  1.7   Liver Function Tests  Recent Labs  07/27/15 0256  AST 26  ALT 18  ALKPHOS 64  BILITOT 0.6  PROT 6.3*  ALBUMIN 3.9   Cardiac Enzymes  Recent Labs  07/27/15 0256 07/27/15 0839 07/27/15 1450  TROPONINI 0.03 0.04* 0.08*   Thyroid Function Tests  Recent Labs  07/27/15 0256  TSH 3.025    Disposition   Pt is being discharged home today in good condition.  Follow-up  Plans & Appointments        Follow-up Information    Follow up with [name], MD On 10/28/2015.   Specialty:  Cardiology   Why:  3:15 PM   Contact information:   [address] [place] [address] [phone]       Follow up with [facility] In 2 weeks.   Why:  we will contact you for a f/u appt.   Contact information:   [address] [place] [address] [phone]       Discharge Medications     Medication List    TAKE these medications        acetaminophen 500 MG tablet  Commonly known as:  TYLENOL  Take 1,000 mg by mouth every 6 (six) hours as needed  for pain.     aspirin 81 MG tablet  Take 81 mg by mouth daily.     atorvastatin 10 MG tablet  Commonly known as:  LIPITOR  Take 10 mg by mouth at bedtime.     clopidogrel 75 MG tablet  Commonly known as:  PLAVIX  Take 75 mg by mouth daily.     COLCRYS 0.6 MG tablet  Generic drug:  colchicine  Take 0.6 mg by mouth daily as needed (gout).     ipratropium 0.03 % nasal spray  Commonly known as:  ATROVENT  Place 1 spray into the nose daily.     isosorbide mononitrate 120 MG 24 hr tablet  Commonly known as:  IMDUR  TAKE ONE TABLET BY MOUTH ONCE DAILY.     isosorbide mononitrate 60 MG 24 hr tablet  Commonly known as:  IMDUR  Take 1 tablet (60 mg total) by mouth daily. In addition to isosorbide  tab daily     losartan 100 MG tablet  Commonly known as:  COZAAR  Take 100 mg by mouth daily.     mexiletine 200 MG capsule  Commonly known as:  MEXITIL  TAKE ONE CAPSULE BY MOUTH EVERY 12 HOURS     MULTIVITAMIN PO  Take 2 tablets by mouth daily.     nitroGLYCERIN 0.4 MG SL tablet  Commonly known as:  NITROSTAT  Place 0.4 mg under the tongue every 5 (five) minutes as needed for chest pain.     OVER THE COUNTER MEDICATION  Take 1 scoop by mouth daily. Barley life otc medication by aim.     pantoprazole 40 MG tablet  Commonly known as:  PROTONIX  TAKE ONE TABLET BY MOUTH ONCE DAILY     ranolazine 500 MG 12 hr tablet  Commonly known as:  RANEXA  Take 2 tablets (1,000 mg total) by mouth 2 (two) times daily.         Outstanding Labs/Studies   None  Duration of Discharge Encounter   Greater than 30 minutes including physician time.  Signed, Nicolasa Ducking NP 07/28/2015, 12:25 PM

## 2015-07-28 NOTE — Progress Notes (Signed)
    Subjective:  Denies CP or dyspnea; " shaky".   Objective:  Filed Vitals:   07/27/15 1326 07/27/15 2100 07/28/15 0430 07/28/15 0725  BP: 130/69 110/57 94/48 150/80  Pulse: 91 83 64 78  Temp: 98.1 F (36.7 C) 98.1 F (36.7 C) 97.8 F (36.6 C)   TempSrc: Oral     Resp: 18 17 14    Height: 5\' 7"  (1.702 m)     Weight: 128 lb 12.8 oz (58.423 kg)  129 lb 9.6 oz (58.786 kg)   SpO2: 100% 97% 98%     Intake/Output from previous day:  Intake/Output Summary (Last 24 hours) at 07/28/15 0737 Last data filed at 07/28/15 0431  Gross per 24 hour  Intake    180 ml  Output      0 ml  Net    180 ml    Physical Exam: Physical exam: Well-developed well-nourished in no acute distress.  Skin is warm and dry.  HEENT is normal.  Neck is supple.  Chest is clear to auscultation with normal expansion.  Cardiovascular exam is regular rate and rhythm.  Abdominal exam nontender or distended. No masses palpated. Extremities show no edema. neuro grossly intact    Lab Results: Basic Metabolic Panel:  Recent Labs  94/85/46 2241 07/27/15 0256  NA 142 145  K 4.5 4.2  CL 105 105  CO2 25 27  GLUCOSE 137* 117*  BUN 28* 27*  CREATININE 1.55* 1.62*  CALCIUM 9.4 9.7  MG  --  1.7   CBC:  Recent Labs  07/26/15 2241 07/27/15 0256  WBC 9.3 8.1  NEUTROABS 7.8* 6.3  HGB 12.2* 12.6*  HCT 36.6* 38.3*  MCV 102.8* 103.0*  PLT 127* 135*   Cardiac Enzymes:  Recent Labs  07/27/15 0256 07/27/15 0839 07/27/15 1450  TROPONINI 0.03 0.04* 0.08*     Assessment/Plan:  1 chest pain-symptoms have resolved. Troponin minimally elevated but trend is flat. I do not think this is consistent with an acute coronary syndrome. He had recent cardiac catheterization in October and medical therapy recommended. I think this is best option for now. Continue aspirin, Plavix, statin and nitrates. Patient cannot be on a beta blocker given baseline conduction abnormalities. 2 question syncope-this is not  clearly documented. He has not had significant arrhythmias on telemetry. We will not pursue further workup at this point. 3 history of ventricular tachycardia-continue mexiletine. 4 hypertension-continue present medications. 5 hyperlipidemia-continue statin. 6 ischemic cardiomyopathy-continue ARB. No beta blocker given conduction abnormalities and history of bradycardia. We will ambulate this morning and if stable discharge later this afternoon. Follow-up Dr. Excell Seltzer. > 30 min PA and physician time D2 Olga Millers 07/28/2015, 7:37 AM

## 2015-07-28 NOTE — Discharge Instructions (Signed)
***  PLEASE REMEMBER TO BRING ALL OF YOUR MEDICATIONS TO EACH OF YOUR FOLLOW-UP OFFICE VISITS.  

## 2015-08-01 DIAGNOSIS — I2510 Atherosclerotic heart disease of native coronary artery without angina pectoris: Secondary | ICD-10-CM | POA: Diagnosis not present

## 2015-08-01 DIAGNOSIS — R05 Cough: Secondary | ICD-10-CM | POA: Diagnosis not present

## 2015-08-01 DIAGNOSIS — J028 Acute pharyngitis due to other specified organisms: Secondary | ICD-10-CM | POA: Diagnosis not present

## 2015-08-01 DIAGNOSIS — K5090 Crohn's disease, unspecified, without complications: Secondary | ICD-10-CM | POA: Diagnosis not present

## 2015-08-01 DIAGNOSIS — I10 Essential (primary) hypertension: Secondary | ICD-10-CM | POA: Diagnosis not present

## 2015-08-01 DIAGNOSIS — E784 Other hyperlipidemia: Secondary | ICD-10-CM | POA: Diagnosis not present

## 2015-08-01 DIAGNOSIS — Z6821 Body mass index (BMI) 21.0-21.9, adult: Secondary | ICD-10-CM | POA: Diagnosis not present

## 2015-08-01 DIAGNOSIS — I498 Other specified cardiac arrhythmias: Secondary | ICD-10-CM | POA: Diagnosis not present

## 2015-08-07 ENCOUNTER — Ambulatory Visit: Payer: Medicare PPO | Admitting: Cardiology

## 2015-08-13 ENCOUNTER — Ambulatory Visit: Payer: Medicare PPO | Admitting: Physician Assistant

## 2015-08-14 NOTE — Progress Notes (Signed)
Cardiology Office Note:    Date:  08/15/2015   ID:  Karl Stevenson, DOB 1926-08-26, MRN [id number]  PCP:  Garlan Fillers, MD  Cardiologist:  Dr. [name]   Electrophysiologist:  Dr. Sherryl Manges   Referring MD: Jarome Matin, MD   Chief Complaint  Patient presents with  . Hospitalization Follow-up    Admx with chest pain >> med rx    History of Present Illness:     Karl Stevenson is a 82 y.o. male with a hx of CAD status post CABG (original surgery in 1987, redo in 2008), ischemic cardiomyopathy, systolic CHF, HTN, PAD, carotid stenosis status post L CEA, AAA, mild R renal artery stenosis, COPD.  Admitted in 11/16 with ventricular tachycardia. Patient and his family wanted to avoid device implantation.  Therefore, he was treated medically.  He was placed on amiodarone, mexiletine and Ranexa.  Amiodarone was DC'd 2/2 bradycardia.    Admitted 3/17-3/19 with chest pain with questionable loss of consciousness. He had minimal elevation in troponin without clear trend. This was not felt to represent ACS. Recent cardiac catheterization in 10/16 with stable anatomy and no target for PCI. Decision was made to continue medical therapy. His nitrate dose was increased.  Returns for FU.  Her discharge, he developed influenza. His symptoms have now resolved. He denies further chest pain. Denies significant dyspnea. He denies orthopnea, PND or edema. Denies syncope. However, he does note dizziness especially with standing. Denies palpitations.  Past Medical History  Diagnosis Date  . Hyperlipidemia   . Hypertension   . Carotid artery occlusion     a. s/p L CEA in 2010;  b. 06/2014 Carotid U/S: stable LICA CEA site w/o significant stenosis bilat.  . Crohn's disease (HCC)     a. s/p colon resection  . Bradycardia     a. previous intolerance to beta blockers - low dose toprol started 10/2011 for tachycardia but had rash and was discontinued;  b. tolerating low dose propranolol.  .  Chronic systolic heart failure (HCC)     a. 02/2015 Echo:  35-40% (echo 11/2010); c. 02/2015 Echo: EF 20-25%, diff HK, mid-apicalanteroseptal and apical AK, Gr 1 DD, mild MR.  . Ischemic cardiomyopathy     a. EF 34% (Lexiscan 06/2011); b. 35-40% (echo 11/2010); c. 02/2015 Echo: EF 20-25%, diff HK, mid-apicalanteroseptal and apical AK, Gr 1 DD, mild MR.  . Coronary artery disease     a. 1969 s/p MI;  b. S/P CABG 1987;  c. S/P redo 2008;  d. 07/2012 Cath: stable->Med Rx, EF 40%; e. 02/2014 MV: anterior and posterolateral/apical infarct with mild peri-infarct ischemia->Med Rx; f. 02/2015 Cath: LM 100d, LAD 100ost, LCX 100ost, OM2 100, VG->OM3->OM4 nl, VG-> LAD nl (Y from VG->OM), VG->OM2 100p.  [name] Peripheral vascular disease (HCC)     a. Carotid dz s/p L CEA, RAS, AAA, LE dz  . AAA (abdominal aortic aneurysm) (HCC)     a. Ectatic abdominal aorta with fusiform dilitation 3.4x3.4 and moderate thrombus burden 12/2010  . RBBB   . Tachycardia     a. 10/2011 - atrial tach vs avnrt - BB started.  [name] COPD (chronic obstructive pulmonary disease) (HCC)   . Ventricular tachycardia (HCC)     a. 02/2015 ->amio 200 daily added-->later switched to mexilitene.  . Gout   . Renal artery stenosis (HCC)     a. Dopplers 12/2010 - 1-59% R ostial renal artery stenosis, 2 L renal arteries both widely patent  Past Surgical History  Procedure Laterality Date  . Carotid endarterectomy Left 2010  . Colon resection  2008  . Tonsillectomy      as a child  . Pr vein bypass graft,aorto-fem-pop  10/1985  . Femoral endarterectomy Left  06/2011    ileofemoral endarterectomy with bovine patch angioplasty  . Endarterectomy  08/26/2011    Procedure: ENDARTERECTOMY ILIAC;  Surgeon: [name], MD;  Location: [facility] OR;  Service: Vascular;  Laterality: Right;  Right Femoral Artery Endarterectomy with vascu guard patch angioplasty & intraoperative arteriogram.  . Cardiac catheterization  07/28/2012    Native 3v CAD, continued patency of  the SVG-OM1-LAD and SVG-OM2-left PDA. LVEF 40% with multiple WMAs  . Abdominal aortagram N/A 06/11/2011    Procedure: ABDOMINAL [name];  Surgeon: [name], MD;  Location: [facility] CATH LAB;  Service: Cardiovascular;  Laterality: N/A;  . Left heart catheterization with coronary angiogram N/A 07/28/2012    Procedure: LEFT HEART CATHETERIZATION WITH CORONARY ANGIOGRAM;  Surgeon: [name], MD;  Location: [facility] CATH LAB;  Service: Cardiovascular;  Laterality: N/A;  . Colon surgery    . Cataract extraction, bilateral    . Coronary artery bypass graft  10/31/85 & 08/19/06  . Coronary angioplasty    . Cardiac catheterization N/A 03/08/2015    Procedure: Left Heart Cath and Coronary Angiography;  Surgeon: [name], MD;  Location: [facility] INVASIVE CV LAB;  Service: Cardiovascular;  Laterality: N/A;  . Eye surgery      Current Medications: Outpatient Prescriptions Prior to Visit  Medication Sig Dispense Refill  . acetaminophen (TYLENOL) 500 MG tablet Take 1,000 mg by mouth every 6 (six) hours as needed for pain.    [name] aspirin 81 MG tablet Take 81 mg by mouth daily.     [name] atorvastatin (LIPITOR) 10 MG tablet Take 10 mg by mouth at bedtime.     . clopidogrel (PLAVIX) 75 MG tablet Take 75 mg by mouth daily.     [name] COLCRYS 0.6 MG tablet Take 0.6 mg by mouth daily as needed (gout).     [name] ipratropium (ATROVENT) 0.03 % nasal spray Place 1 spray into the nose daily.     . isosorbide mononitrate (IMDUR) 120 MG 24 hr tablet TAKE ONE TABLET BY MOUTH ONCE DAILY. 180 tablet 2  . isosorbide mononitrate (IMDUR) 60 MG 24 hr tablet Take 1 tablet (60 mg total) by mouth daily. In addition to isosorbide 120mg  tab daily 30 tablet 6  . mexiletine (MEXITIL) 200 MG capsule TAKE ONE CAPSULE BY MOUTH EVERY 12 HOURS 60 capsule 1  . Multiple Vitamins-Minerals (MULTIVITAMIN PO) Take 2 tablets by mouth daily.     . nitroGLYCERIN (NITROSTAT) 0.4 MG SL tablet Place 0.4 mg under the tongue every 5 (five) minutes as needed for chest  pain.    [name] OVER THE COUNTER MEDICATION Take 1 scoop by mouth daily. Barley life otc medication by aim.    . pantoprazole (PROTONIX) 40 MG tablet TAKE ONE TABLET BY MOUTH ONCE DAILY 90 tablet 2  . ranolazine (RANEXA) 500 MG 12 hr tablet Take 2 tablets (1,000 mg total) by mouth 2 (two) times daily. 120 tablet 6  . losartan (COZAAR) 100 MG tablet Take 100 mg by mouth daily.     No facility-administered medications prior to visit.     Allergies:   Novocain and Metoprolol   Social History   Social History  . Marital Status: Married    Spouse Name: N/A  . Number of  Children: N/A  . Years of Education: N/A   Social History Main Topics  . Smoking status: Never Smoker   . Smokeless tobacco: Never Used  . Alcohol Use: No  . Drug Use: No  . Sexual Activity: Not Asked   Other Topics Concern  . None   Social History Narrative     Family History:  The patient's family history includes CAD in his brother; COPD in his brother and mother; Cancer in his mother; Deep vein thrombosis in his brother; Heart disease in his father. There is no history of Anesthesia problems.   ROS:   Please see the history of present illness.    ROS All other systems reviewed and are negative.   Physical Exam:    VS:  BP 90/50 mmHg  Pulse 76  Ht 5\' 7"  (1.702 m)  Wt 132 lb 6.4 oz (60.056 kg)  BMI 20.73 kg/m2   GEN: Well nourished, well developed, in no acute distress HEENT: normal Neck: no JVD at 90, no masses Cardiac: Normal S1/S2, RRR; no murmurs, rubs, or gallops, no edema;     Respiratory:  clear to auscultation bilaterally; no wheezing, rhonchi or rales GI: soft, nontender, nondistended MS: no deformity or atrophy Skin: warm and dry Neuro: No focal deficits  Psych: Alert and oriented x 3, normal affect  Wt Readings from Last 3 Encounters:  08/15/15 132 lb 6.4 oz (60.056 kg)  07/28/15 129 lb 9.6 oz (58.786 kg)  07/18/15 137 lb (62.143 kg)      Studies/Labs Reviewed:     EKG:  EKG is   ordered today.  The ekg ordered today demonstrates NSR, HR 76, RBBB, PR 236 ms, QTc 546 (464 with corr for RBBB), no sig changes.   Recent Labs: 07/27/2015: ALT 18; BUN 27*; Creatinine, Ser 1.62*; Hemoglobin 12.6*; Magnesium 1.7; Platelets 135*; Potassium 4.2; Sodium 145; TSH 3.025   Recent Lipid Panel    Component Value Date/Time   CHOL 99 03/06/2015 0341   TRIG 138 03/06/2015 0341   HDL 30* 03/06/2015 0341   CHOLHDL 3.3 03/06/2015 0341   VLDL 28 03/06/2015 0341   LDLCALC 41 03/06/2015 0341    Additional studies/ records that were reviewed today include:   Carotid US 2/17 R < 40%; L CEA patent  LHC 10/16 LM 100% - Known to have severe dLM stenosis with occluded LAD & Cx LAD ostial 100% LCx ostial 100% - CTO; OM2 100% RCA not injected - known to be small and non-dominant S-OM3/OM4 ok S-LAD ok S-OM2 prox 100%  No PCI targets  Echo 10/16 EF 20-25%, diff HK, ant-septal and apical AK, Gr 1 DD, mild MR  Event Monitor 10/16 The basic rhythm is sinus There are episode of sustained wide-complex tachycardia consistent with ventricular tachycardia  Myoview 10/15 IMPRESSION: 1. Large fixed defect with associated hypokinesis beginning in the anterior wall of the mid ventricle extending around the true apex and into the posterolateral apical wall consistent with a region of remote infarct/ scarring. There may be a small amount of superimposed reversibility in anterolateral wall of the mid ventricle. 2. Normal left ventricular wall motion. 3. Left ventricular ejection fraction 45% 4. Moderate-risk stress test findings*.   ASSESSMENT:     1. Coronary artery disease involving coronary bypass graft of native heart with unspecified angina pectoris   2. Chronic systolic heart failure (HCC)   3. Ischemic cardiomyopathy   4. Ventricular tachycardia (HCC)   5. Essential hypertension   6. HLD (hyperlipidemia)  7. PVD (peripheral vascular disease) (HCC)   8. CKD (chronic kidney  disease), stage III     PLAN:     In order of problems listed above:  1. CAD - s/p redo CABG 2008.  LHC in 10/16 with occluded S-OM2 but patent S-LAD and S-OM2/OM4.  Med Rx recommended.  Recent admit with CP.  Troponin levels were minimally elevated without clear trend.  This was not felt to represent ACS. Given recent LHC without clear targets for PCI, no further workup was pursued. Nitrate dose was increased. He is not on beta blocker therapy given prior history of bradycardia. He continues on Ranexa. He has not had further chest discomfort. Continue aspirin, Plavix, statin, nitrates, Ranexa.+  2. Chronic systolic CHF - NYHA 2b. Volume appears stable. He is not on chronic diuretics.  3. Ischemic cardiomyopathy - His blood pressure is running low. I suspect that this is related to his recent bout with the flu in addition to a large dose of nitrates. He's had some orthostatic intolerance in the past. I will have him hold his losartan for 1 day and then resume a lower dose losartan 25 mg daily.  As noted, he is not on beta-blocker due to bradycardia.  Continue nitrates.    4. Ventricular tachycardia - Maintaining NSR today.  As noted, he has declined ICD implantation in the past and opted for medical Rx of his VT.  Given his recent bout with influenza, I will check a BMET, magnesium today to ensure his electrolytes are stable.  Continue mexiletine and Ranexa. Follow-up with EP as planned.  5. HTN -  As noted blood pressure running low. Adjust losartan as noted above.  6. HL - Continue statin.  7. PAD - FU with [facility]VS as planned.   8. CKD - Recent creatinines stable.  Obtain BMET today as noted.     Medication Adjustments/Labs and Tests Ordered: Current medicines are reviewed at length with the patient today.  Concerns regarding medicines are outlined above.  Medication changes, Labs and Tests ordered today are outlined in the Patient Instructions noted below. Patient Instructions  Medication  Instructions:  1 HOLD LOSARTAN FOR 1 DAY ;AFTER HOLDING LOSARTAN FOR 1 DAY YOU WILL START NEW DECREASED DOSE OF LOSARTAN 25 MG DAILY; NEW RX SENT IN TODAY  Labwork: TODAY BMET, MAGNESIUM LEVEL  Testing/Procedures: NONE  Follow-Up: KEEP YOUR APPT WITH DR. Excell Seltzer 10/2015  Any Other Special Instructions Will Be Listed Below (If Applicable).  If you need a refill on your cardiac medications before your next appointment, please call your pharmacy.    Signed, Tereso Newcomer, PA-C  08/15/2015 1:33 PM    [facility] [address] [place], [place], [place]  [address] Phone: [phone]; Fax: [phone]

## 2015-08-15 ENCOUNTER — Encounter: Payer: Self-pay | Admitting: Physician Assistant

## 2015-08-15 ENCOUNTER — Ambulatory Visit (INDEPENDENT_AMBULATORY_CARE_PROVIDER_SITE_OTHER): Payer: Medicare PPO | Admitting: Physician Assistant

## 2015-08-15 VITALS — BP 90/50 | HR 76 | Ht 67.0 in | Wt 132.4 lb

## 2015-08-15 DIAGNOSIS — I472 Ventricular tachycardia, unspecified: Secondary | ICD-10-CM

## 2015-08-15 DIAGNOSIS — E785 Hyperlipidemia, unspecified: Secondary | ICD-10-CM

## 2015-08-15 DIAGNOSIS — I5022 Chronic systolic (congestive) heart failure: Secondary | ICD-10-CM | POA: Diagnosis not present

## 2015-08-15 DIAGNOSIS — I5020 Unspecified systolic (congestive) heart failure: Secondary | ICD-10-CM

## 2015-08-15 DIAGNOSIS — I255 Ischemic cardiomyopathy: Secondary | ICD-10-CM | POA: Diagnosis not present

## 2015-08-15 DIAGNOSIS — I739 Peripheral vascular disease, unspecified: Secondary | ICD-10-CM

## 2015-08-15 DIAGNOSIS — I25709 Atherosclerosis of coronary artery bypass graft(s), unspecified, with unspecified angina pectoris: Secondary | ICD-10-CM | POA: Diagnosis not present

## 2015-08-15 DIAGNOSIS — N183 Chronic kidney disease, stage 3 unspecified: Secondary | ICD-10-CM

## 2015-08-15 DIAGNOSIS — I10 Essential (primary) hypertension: Secondary | ICD-10-CM

## 2015-08-15 LAB — BASIC METABOLIC PANEL
BUN: 29 mg/dL — ABNORMAL HIGH (ref 7–25)
CHLORIDE: 108 mmol/L (ref 98–110)
CO2: 21 mmol/L (ref 20–31)
Calcium: 8.4 mg/dL — ABNORMAL LOW (ref 8.6–10.3)
Creat: 1.51 mg/dL — ABNORMAL HIGH (ref 0.70–1.11)
GLUCOSE: 96 mg/dL (ref 65–99)
POTASSIUM: 4.2 mmol/L (ref 3.5–5.3)
SODIUM: 141 mmol/L (ref 135–146)

## 2015-08-15 LAB — MAGNESIUM: Magnesium: 1.3 mg/dL — ABNORMAL LOW (ref 1.5–2.5)

## 2015-08-15 MED ORDER — LOSARTAN POTASSIUM 25 MG PO TABS: 25.0000 mg | ORAL_TABLET | Freq: Every day | ORAL | Status: DC

## 2015-08-15 NOTE — Patient Instructions (Addendum)
Medication Instructions:  1 HOLD LOSARTAN FOR 1 DAY ;AFTER HOLDING LOSARTAN FOR 1 DAY YOU WILL START NEW DECREASED DOSE OF LOSARTAN 25 MG DAILY; NEW RX SENT IN TODAY  Labwork: TODAY BMET, MAGNESIUM LEVEL  Testing/Procedures: NONE  Follow-Up: KEEP YOUR APPT WITH DR. Excell Seltzer 10/2015  Any Other Special Instructions Will Be Listed Below (If Applicable).  If you need a refill on your cardiac medications before your next appointment, please call your pharmacy.

## 2015-08-16 ENCOUNTER — Telehealth: Payer: Self-pay | Admitting: *Deleted

## 2015-08-16 DIAGNOSIS — I10 Essential (primary) hypertension: Secondary | ICD-10-CM

## 2015-08-16 DIAGNOSIS — N183 Chronic kidney disease, stage 3 unspecified: Secondary | ICD-10-CM

## 2015-08-16 MED ORDER — MAGNESIUM 400 MG PO TABS: 400.0000 mg | ORAL_TABLET | Freq: Two times a day (BID) | ORAL | Status: DC

## 2015-08-16 NOTE — Telephone Encounter (Signed)
Pt notified of lab results and findings of Magnesium low. Pt agreeable to start magnesium 400 mg twice daily; Rx sent in, bmet, mag lab 4/24

## 2015-09-01 ENCOUNTER — Other Ambulatory Visit: Payer: Self-pay | Admitting: Internal Medicine

## 2015-09-02 ENCOUNTER — Other Ambulatory Visit (INDEPENDENT_AMBULATORY_CARE_PROVIDER_SITE_OTHER): Payer: Medicare PPO | Admitting: *Deleted

## 2015-09-02 ENCOUNTER — Telehealth: Payer: Self-pay | Admitting: *Deleted

## 2015-09-02 DIAGNOSIS — I10 Essential (primary) hypertension: Secondary | ICD-10-CM

## 2015-09-02 DIAGNOSIS — N183 Chronic kidney disease, stage 3 unspecified: Secondary | ICD-10-CM

## 2015-09-02 LAB — BASIC METABOLIC PANEL
BUN: 24 mg/dL (ref 7–25)
CALCIUM: 8.9 mg/dL (ref 8.6–10.3)
CO2: 26 mmol/L (ref 20–31)
CREATININE: 1.31 mg/dL — AB (ref 0.70–1.11)
Chloride: 103 mmol/L (ref 98–110)
GLUCOSE: 90 mg/dL (ref 65–99)
Potassium: 4.4 mmol/L (ref 3.5–5.3)
Sodium: 140 mmol/L (ref 135–146)

## 2015-09-02 LAB — MAGNESIUM: Magnesium: 1.7 mg/dL (ref 1.5–2.5)

## 2015-09-02 NOTE — Telephone Encounter (Signed)
Pt has been notified of lab results by phone with verbal understanding. 

## 2015-09-05 ENCOUNTER — Telehealth: Payer: Self-pay | Admitting: Physician Assistant

## 2015-09-05 NOTE — Telephone Encounter (Signed)
New message      Pt c/o BP issue: STAT if pt c/o blurred vision, one-sided weakness or slurred speech  1. What are your last 5 BP readings? 87/44 2. Are you having any other symptoms (ex. Dizziness, headache, blurred vision, passed out)?  no 3. What is your BP issue?  bp is too low.  Pt is taking losartan 25mg  daily

## 2015-09-05 NOTE — Telephone Encounter (Signed)
Instructed patient to check BP PRIOR to taking Losartan.  He understands if BP is low (less than 100 systolic), to hold medication and call [facility]. Patient was grateful for call.

## 2015-09-05 NOTE — Telephone Encounter (Signed)
If BP low again, hold Losartan and call us. Tereso Newcomer, PA-C   09/05/2015 5:44 PM

## 2015-09-05 NOTE — Telephone Encounter (Signed)
Patient st earlier today, his BP dropped to 89/44. He was weak, sweaty, and nauseated. He took another Losartan 25 mg to see if he would feel better (in addition to the Losartan 25 daily he already took this AM). The episode lasted about 30 minutes and now he feels fine. His BP is now 125/62 and HR 63. Instructed patient to ONLY TAKE Losartan 25 mg daily and to never take extra doses unless instructed by a medical professional. Reiterated to patient that Losartan lowers blood pressure and should actually be held if BP is very low. Patient now understands this and knows to never take more than what is ordered. Patient understands to call if BP drops again or if he experiences symptoms.  He understands he will be called if Tereso Newcomer, PA has any further instruction.

## 2015-10-01 ENCOUNTER — Telehealth: Payer: Self-pay | Admitting: Cardiovascular Disease

## 2015-10-01 NOTE — Telephone Encounter (Signed)
I spoke with the pt and he wanted to make sure that his BP readings were okay since he is taking Losartan. I made him aware that he is okay to continue taking his current medication.  The pt denies any further episodes of BP dropping, weakness, diaphoresis or nausea.

## 2015-10-01 NOTE — Telephone Encounter (Signed)
New Message  Pt called   Pt c/o BP issue:  1. What are your last 5 BP readings?   09/21/15 @ 8:09a BP 95/67 102 Pulse  09/21/15 @ 9:02p BP 135/84 99 Pulse   09/22/15 @ 6:45a BP 141/74 74 Pulse 09/22/15 @ 3:40p BP 128/65 65 Pulse   09/23/15 @ 6:35a BP 158/87 79 Pulse 09/23/15 @ 7:39p BP 138/79 77 Pulse   09/24/15 @ 6:44a BP 155/80 69 Pulse  09/24/15 @ 7:47p Bp 140/71 70 Pulse   09/25/15 at 7:02a BP 150/84 65 pulse  ( before he took losartan)  09/25/15 at 7:59a BP 128/69 70 pulse  (after he took losatan)    2. Are you having any other symptoms (ex. Dizziness, headache, blurred vision, passed out)? No  3. What is your medication issue? Pt states that his BP has been fairly low after taking losartan

## 2015-10-08 ENCOUNTER — Telehealth: Payer: Self-pay | Admitting: Cardiovascular Disease

## 2015-10-08 NOTE — Telephone Encounter (Signed)
New messqgae   Pt has a question for rh he wants another prescription that can take the place of his Ranexa  and its 4x day  Pt wants to go back to the 2x day pills and when he went to the hospital they increased it to 4x day  Pt will get next prescription next week the amount 266.00 for 30 days/ Pt is in donut hole

## 2015-10-08 NOTE — Telephone Encounter (Signed)
I spoke with the pt and he has to spend $1500 out of pocket before his drug coverage resumes.  The pt wondered about lowering Ranexa to 500mg  twice a day. I made the pt aware that there are no other medications available on the market in comparison to Ranexa. I also advised him that the out of pocket expense may be the exact same for the lower dosage and he would need to check with his pharmacy about pricing.  The pt will contact the office if he decides he wants to decrease Ranexa dosage.

## 2015-10-08 NOTE — Telephone Encounter (Signed)
This is not one of our patients. Dr.McLean and Dr.Bensimhon are the only doctors here at the heart failure clinic. It looks like patient is being followed by [facility] on [address]

## 2015-10-10 ENCOUNTER — Telehealth: Payer: Self-pay | Admitting: Cardiovascular Disease

## 2015-10-10 NOTE — Telephone Encounter (Signed)
New message   Pt called to leave message for RN he verbalized that   he has not checked price difference on medication Ranexa  and he decided to leave things as it is.  Pt said he do not need any refills he said he was speaking to rn about different prices due to the insurance for the medication

## 2015-10-10 NOTE — Telephone Encounter (Signed)
Will forward to [name], Dr. Earmon Phoenix nurse, to make her aware.

## 2015-10-15 DIAGNOSIS — G6289 Other specified polyneuropathies: Secondary | ICD-10-CM | POA: Diagnosis not present

## 2015-10-15 DIAGNOSIS — R110 Nausea: Secondary | ICD-10-CM | POA: Diagnosis not present

## 2015-10-15 DIAGNOSIS — I2510 Atherosclerotic heart disease of native coronary artery without angina pectoris: Secondary | ICD-10-CM | POA: Diagnosis not present

## 2015-10-15 DIAGNOSIS — Z6821 Body mass index (BMI) 21.0-21.9, adult: Secondary | ICD-10-CM | POA: Diagnosis not present

## 2015-10-15 DIAGNOSIS — I7389 Other specified peripheral vascular diseases: Secondary | ICD-10-CM | POA: Diagnosis not present

## 2015-10-15 DIAGNOSIS — K5909 Other constipation: Secondary | ICD-10-CM | POA: Diagnosis not present

## 2015-10-18 DIAGNOSIS — Z961 Presence of intraocular lens: Secondary | ICD-10-CM | POA: Diagnosis not present

## 2015-10-18 DIAGNOSIS — H02839 Dermatochalasis of unspecified eye, unspecified eyelid: Secondary | ICD-10-CM | POA: Diagnosis not present

## 2015-10-18 DIAGNOSIS — H18413 Arcus senilis, bilateral: Secondary | ICD-10-CM | POA: Diagnosis not present

## 2015-10-24 ENCOUNTER — Emergency Department (HOSPITAL_COMMUNITY): Payer: Medicare PPO

## 2015-10-24 ENCOUNTER — Encounter (HOSPITAL_COMMUNITY): Payer: Self-pay | Admitting: Emergency Medicine

## 2015-10-24 ENCOUNTER — Observation Stay (HOSPITAL_COMMUNITY)
Admission: EM | Admit: 2015-10-24 | Discharge: 2015-10-25 | Disposition: A | Discharge status: Home / Self Care | Payer: Medicare PPO | Attending: Internal Medicine | Admitting: Internal Medicine

## 2015-10-24 DIAGNOSIS — A09 Infectious gastroenteritis and colitis, unspecified: Secondary | ICD-10-CM

## 2015-10-24 DIAGNOSIS — Z7982 Long term (current) use of aspirin: Secondary | ICD-10-CM | POA: Insufficient documentation

## 2015-10-24 DIAGNOSIS — R42 Dizziness and giddiness: Secondary | ICD-10-CM | POA: Diagnosis not present

## 2015-10-24 DIAGNOSIS — I739 Peripheral vascular disease, unspecified: Secondary | ICD-10-CM | POA: Diagnosis present

## 2015-10-24 DIAGNOSIS — I714 Abdominal aortic aneurysm, without rupture: Secondary | ICD-10-CM | POA: Diagnosis not present

## 2015-10-24 DIAGNOSIS — I951 Orthostatic hypotension: Secondary | ICD-10-CM

## 2015-10-24 DIAGNOSIS — I5022 Chronic systolic (congestive) heart failure: Secondary | ICD-10-CM | POA: Diagnosis present

## 2015-10-24 DIAGNOSIS — J449 Chronic obstructive pulmonary disease, unspecified: Secondary | ICD-10-CM | POA: Insufficient documentation

## 2015-10-24 DIAGNOSIS — I10 Essential (primary) hypertension: Secondary | ICD-10-CM | POA: Diagnosis present

## 2015-10-24 DIAGNOSIS — Z79899 Other long term (current) drug therapy: Secondary | ICD-10-CM | POA: Insufficient documentation

## 2015-10-24 DIAGNOSIS — E785 Hyperlipidemia, unspecified: Secondary | ICD-10-CM | POA: Diagnosis not present

## 2015-10-24 DIAGNOSIS — I2510 Atherosclerotic heart disease of native coronary artery without angina pectoris: Secondary | ICD-10-CM | POA: Insufficient documentation

## 2015-10-24 DIAGNOSIS — R112 Nausea with vomiting, unspecified: Secondary | ICD-10-CM | POA: Diagnosis not present

## 2015-10-24 DIAGNOSIS — Z7902 Long term (current) use of antithrombotics/antiplatelets: Secondary | ICD-10-CM | POA: Diagnosis not present

## 2015-10-24 DIAGNOSIS — I4510 Unspecified right bundle-branch block: Secondary | ICD-10-CM | POA: Insufficient documentation

## 2015-10-24 DIAGNOSIS — I130 Hypertensive heart and chronic kidney disease with heart failure and stage 1 through stage 4 chronic kidney disease, or unspecified chronic kidney disease: Secondary | ICD-10-CM | POA: Diagnosis not present

## 2015-10-24 DIAGNOSIS — D696 Thrombocytopenia, unspecified: Secondary | ICD-10-CM | POA: Diagnosis not present

## 2015-10-24 DIAGNOSIS — Z951 Presence of aortocoronary bypass graft: Secondary | ICD-10-CM | POA: Diagnosis not present

## 2015-10-24 DIAGNOSIS — R55 Syncope and collapse: Principal | ICD-10-CM | POA: Diagnosis present

## 2015-10-24 DIAGNOSIS — N183 Chronic kidney disease, stage 3 unspecified: Secondary | ICD-10-CM | POA: Diagnosis present

## 2015-10-24 DIAGNOSIS — E860 Dehydration: Secondary | ICD-10-CM | POA: Diagnosis not present

## 2015-10-24 DIAGNOSIS — I472 Ventricular tachycardia, unspecified: Secondary | ICD-10-CM

## 2015-10-24 DIAGNOSIS — M109 Gout, unspecified: Secondary | ICD-10-CM | POA: Diagnosis not present

## 2015-10-24 DIAGNOSIS — I255 Ischemic cardiomyopathy: Secondary | ICD-10-CM | POA: Diagnosis present

## 2015-10-24 DIAGNOSIS — R197 Diarrhea, unspecified: Secondary | ICD-10-CM | POA: Diagnosis present

## 2015-10-24 DIAGNOSIS — D649 Anemia, unspecified: Secondary | ICD-10-CM | POA: Diagnosis present

## 2015-10-24 DIAGNOSIS — D631 Anemia in chronic kidney disease: Secondary | ICD-10-CM | POA: Insufficient documentation

## 2015-10-24 HISTORY — DX: Syncope and collapse: R55

## 2015-10-24 LAB — COMPREHENSIVE METABOLIC PANEL
ALT: 17 U/L (ref 17–63)
AST: 21 U/L (ref 15–41)
Albumin: 3.5 g/dL (ref 3.5–5.0)
Alkaline Phosphatase: 59 U/L (ref 38–126)
Anion gap: 5 (ref 5–15)
BUN: 26 mg/dL — ABNORMAL HIGH (ref 6–20)
CHLORIDE: 110 mmol/L (ref 101–111)
CO2: 25 mmol/L (ref 22–32)
Calcium: 8.7 mg/dL — ABNORMAL LOW (ref 8.9–10.3)
Creatinine, Ser: 1.49 mg/dL — ABNORMAL HIGH (ref 0.61–1.24)
GFR, EST AFRICAN AMERICAN: 47 mL/min — AB (ref >60)
GFR, EST NON AFRICAN AMERICAN: 41 mL/min — AB (ref >60)
Glucose, Bld: 140 mg/dL — ABNORMAL HIGH (ref 65–99)
POTASSIUM: 4 mmol/L (ref 3.5–5.1)
SODIUM: 140 mmol/L (ref 135–145)
Total Bilirubin: 0.5 mg/dL (ref 0.3–1.2)
Total Protein: 6.1 g/dL — ABNORMAL LOW (ref 6.5–8.1)

## 2015-10-24 LAB — CBC WITH DIFFERENTIAL/PLATELET
BASOS ABS: 0 10*3/uL (ref 0.0–0.1)
Basophils Relative: 0 %
EOS ABS: 0.1 10*3/uL (ref 0.0–0.7)
EOS PCT: 1 %
HCT: 36.6 % — ABNORMAL LOW (ref 39.0–52.0)
Hemoglobin: 11.7 g/dL — ABNORMAL LOW (ref 13.0–17.0)
LYMPHS ABS: 0.9 10*3/uL (ref 0.7–4.0)
LYMPHS PCT: 14 %
MCH: 34 pg (ref 26.0–34.0)
MCHC: 32 g/dL (ref 30.0–36.0)
MCV: 106.4 fL — AB (ref 78.0–100.0)
MONO ABS: 0.5 10*3/uL (ref 0.1–1.0)
Monocytes Relative: 7 %
Neutro Abs: 5.2 10*3/uL (ref 1.7–7.7)
Neutrophils Relative %: 78 %
PLATELETS: 138 10*3/uL — AB (ref 150–400)
RBC: 3.44 MIL/uL — AB (ref 4.22–5.81)
RDW: 13.3 % (ref 11.5–15.5)
WBC: 6.7 10*3/uL (ref 4.0–10.5)

## 2015-10-24 LAB — URINALYSIS, ROUTINE W REFLEX MICROSCOPIC
Bilirubin Urine: NEGATIVE
Glucose, UA: NEGATIVE mg/dL
Hgb urine dipstick: NEGATIVE
Ketones, ur: NEGATIVE mg/dL
LEUKOCYTES UA: NEGATIVE
NITRITE: NEGATIVE
PH: 5.5 (ref 5.0–8.0)
Protein, ur: NEGATIVE mg/dL
SPECIFIC GRAVITY, URINE: 1.022 (ref 1.005–1.030)

## 2015-10-24 LAB — I-STAT TROPONIN, ED: TROPONIN I, POC: 0.02 ng/mL (ref 0.00–0.08)

## 2015-10-24 LAB — TSH: TSH: 2.053 u[IU]/mL (ref 0.350–4.500)

## 2015-10-24 LAB — TROPONIN I: TROPONIN I: 0.03 ng/mL (ref <0.031)

## 2015-10-24 MED ORDER — PANTOPRAZOLE SODIUM 40 MG PO TBEC
40.0000 mg | DELAYED_RELEASE_TABLET | Freq: Every day | ORAL | Status: DC
  Administered 2015-10-25: 40 mg via ORAL
  Filled 2015-10-24: qty 1

## 2015-10-24 MED ORDER — NITROGLYCERIN 0.4 MG SL SUBL: 0.4000 mg | SUBLINGUAL_TABLET | SUBLINGUAL | Status: DC | PRN

## 2015-10-24 MED ORDER — COLCHICINE 0.6 MG PO TABS: 0.6000 mg | ORAL_TABLET | Freq: Every day | ORAL | Status: DC | PRN

## 2015-10-24 MED ORDER — ATORVASTATIN CALCIUM 10 MG PO TABS
10.0000 mg | ORAL_TABLET | Freq: Every day | ORAL | Status: DC
  Administered 2015-10-24: 10 mg via ORAL
  Filled 2015-10-24: qty 1

## 2015-10-24 MED ORDER — MEXILETINE HCL 200 MG PO CAPS
200.0000 mg | ORAL_CAPSULE | Freq: Two times a day (BID) | ORAL | Status: DC
  Administered 2015-10-24 – 2015-10-25 (×2): 200 mg via ORAL
  Filled 2015-10-24 (×4): qty 1

## 2015-10-24 MED ORDER — ISOSORBIDE MONONITRATE ER 60 MG PO TB24: 120.0000 mg | ORAL_TABLET | Freq: Every day | ORAL | Status: DC

## 2015-10-24 MED ORDER — ONDANSETRON HCL 4 MG/2ML IJ SOLN: 4.0000 mg | Freq: Four times a day (QID) | INTRAMUSCULAR | Status: DC | PRN

## 2015-10-24 MED ORDER — SODIUM CHLORIDE 0.9 % IV SOLN: INTRAVENOUS | Status: DC

## 2015-10-24 MED ORDER — ONDANSETRON HCL 4 MG PO TABS: 4.0000 mg | ORAL_TABLET | Freq: Four times a day (QID) | ORAL | Status: DC | PRN

## 2015-10-24 MED ORDER — ASPIRIN EC 81 MG PO TBEC
81.0000 mg | DELAYED_RELEASE_TABLET | Freq: Every day | ORAL | Status: DC
  Administered 2015-10-25: 81 mg via ORAL
  Filled 2015-10-24: qty 1

## 2015-10-24 MED ORDER — MAGNESIUM OXIDE 400 (241.3 MG) MG PO TABS
400.0000 mg | ORAL_TABLET | Freq: Two times a day (BID) | ORAL | Status: DC
  Administered 2015-10-24 – 2015-10-25 (×2): 400 mg via ORAL
  Filled 2015-10-24: qty 1

## 2015-10-24 MED ORDER — IPRATROPIUM BROMIDE 0.06 % NA SOLN
1.0000 | Freq: Every day | NASAL | Status: DC
  Administered 2015-10-25: 1 via NASAL
  Filled 2015-10-24: qty 15

## 2015-10-24 MED ORDER — ACETAMINOPHEN 650 MG RE SUPP: 650.0000 mg | Freq: Four times a day (QID) | RECTAL | Status: DC | PRN

## 2015-10-24 MED ORDER — ISOSORBIDE MONONITRATE ER 60 MG PO TB24: 60.0000 mg | ORAL_TABLET | Freq: Every day | ORAL | Status: DC

## 2015-10-24 MED ORDER — IPRATROPIUM BROMIDE 0.03 % NA SOLN
1.0000 | Freq: Every day | NASAL | Status: DC
  Filled 2015-10-24: qty 30

## 2015-10-24 MED ORDER — CLOPIDOGREL BISULFATE 75 MG PO TABS
75.0000 mg | ORAL_TABLET | Freq: Every day | ORAL | Status: DC
  Administered 2015-10-24 – 2015-10-25 (×2): 75 mg via ORAL
  Filled 2015-10-24 (×2): qty 1

## 2015-10-24 MED ORDER — MAGNESIUM 400 MG PO TABS
400.0000 mg | ORAL_TABLET | Freq: Two times a day (BID) | ORAL | Status: DC
  Filled 2015-10-24 (×2): qty 1

## 2015-10-24 MED ORDER — SODIUM CHLORIDE 0.9 % IV SOLN: Freq: Once | INTRAVENOUS | Status: DC

## 2015-10-24 MED ORDER — RANOLAZINE ER 500 MG PO TB12
1000.0000 mg | ORAL_TABLET | Freq: Two times a day (BID) | ORAL | Status: DC
  Administered 2015-10-24 – 2015-10-25 (×2): 1000 mg via ORAL
  Filled 2015-10-24 (×2): qty 2

## 2015-10-24 MED ORDER — ISOSORBIDE MONONITRATE ER 60 MG PO TB24
180.0000 mg | ORAL_TABLET | Freq: Every day | ORAL | Status: DC
  Administered 2015-10-25: 180 mg via ORAL
  Filled 2015-10-24: qty 3

## 2015-10-24 MED ORDER — ENOXAPARIN SODIUM 30 MG/0.3ML ~~LOC~~ SOLN: 30.0000 mg | SUBCUTANEOUS | Status: DC

## 2015-10-24 MED ORDER — ASPIRIN 81 MG PO TABS: 81.0000 mg | ORAL_TABLET | Freq: Every day | ORAL | Status: DC

## 2015-10-24 MED ORDER — ACETAMINOPHEN 325 MG PO TABS: 650.0000 mg | ORAL_TABLET | Freq: Four times a day (QID) | ORAL | Status: DC | PRN

## 2015-10-24 MED ORDER — SODIUM CHLORIDE 0.9% FLUSH
3.0000 mL | Freq: Two times a day (BID) | INTRAVENOUS | Status: DC
  Administered 2015-10-25: 3 mL via INTRAVENOUS
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
## 2015-10-24 NOTE — ED Provider Notes (Addendum)
CSN: [id number]     Arrival date & time 10/24/15  1010 History   First MD Initiated Contact with Patient 10/24/15 1013     Chief Complaint  Patient presents with  . Loss of Consciousness  . Nausea     (Consider location/radiation/quality/duration/timing/severity/associated sxs/prior Treatment) HPI 82 year old male who presents with syncope. He has a history of ischemic cardiomyopathy with EF of 25%, hypertension, hyperlipidemia, abdominal aortic aneurysm. States that he was eating breakfast at 9 AM this morning when he began to feel very nauseated and had tremoring in his extremities. States that he felt lightheaded and subsequently passed out. His wife called EMS and on their arrival he was alert and oriented. States that he may initially have had some slurring of his speech that was very mild, but this is fully resolved. States that the day before yesterday he vomited once, and had 3-4 episodes of diarrhea. Did not have chest pain or difficulty breathing, cough, fevers or chills, abdominal pain, back pain, palpitations, vision changes, focal numbness or weakness. Does report generalized weakness.  Past Medical History  Diagnosis Date  . Hyperlipidemia   . Hypertension   . Carotid artery occlusion     a. s/p L CEA in 2010;  b. 06/2014 Carotid U/S: stable LICA CEA site w/o significant stenosis bilat.  . Crohn's disease (HCC)     a. s/p colon resection  . Bradycardia     a. previous intolerance to beta blockers - low dose toprol started 10/2011 for tachycardia but had rash and was discontinued;  b. tolerating low dose propranolol.  . Chronic systolic heart failure (HCC)     a. 02/2015 Echo:  35-40% (echo 11/2010); c. 02/2015 Echo: EF 20-25%, diff HK, mid-apicalanteroseptal and apical AK, Gr 1 DD, mild MR.  . Ischemic cardiomyopathy     a. EF 34% (Lexiscan 06/2011); b. 35-40% (echo 11/2010); c. 02/2015 Echo: EF 20-25%, diff HK, mid-apicalanteroseptal and apical AK, Gr 1 DD, mild MR.  . Coronary  artery disease     a. 1969 s/p MI;  b. S/P CABG 1987;  c. S/P redo 2008;  d. 07/2012 Cath: stable->Med Rx, EF 40%; e. 02/2014 MV: anterior and posterolateral/apical infarct with mild peri-infarct ischemia->Med Rx; f. 02/2015 Cath: LM 100d, LAD 100ost, LCX 100ost, OM2 100, VG->OM3->OM4 nl, VG-> LAD nl (Y from VG->OM), VG->OM2 100p.  [name] Peripheral vascular disease (HCC)     a. Carotid dz s/p L CEA, RAS, AAA, LE dz  . AAA (abdominal aortic aneurysm) (HCC)     a. Ectatic abdominal aorta with fusiform dilitation 3.4x3.4 and moderate thrombus burden 12/2010  . RBBB   . Tachycardia     a. 10/2011 - atrial tach vs avnrt - BB started.  [name] COPD (chronic obstructive pulmonary disease) (HCC)   . Ventricular tachycardia (HCC)     a. 02/2015 ->amio 200 daily added-->later switched to mexilitene.  . Gout   . Renal artery stenosis (HCC)     a. Dopplers 12/2010 - 1-59% R ostial renal artery stenosis, 2 L renal arteries both widely patent  . Syncope    Past Surgical History  Procedure Laterality Date  . Carotid endarterectomy Left 2010  . Colon resection  2008  . Tonsillectomy      as a child  . Pr vein bypass graft,aorto-fem-pop  10/1985  . Femoral endarterectomy Left  06/2011    ileofemoral endarterectomy with bovine patch angioplasty  . Endarterectomy  08/26/2011    Procedure: ENDARTERECTOMY ILIAC;  Surgeon: Arlys John  Rolena Infante, MD;  Location: [facility] OR;  Service: Vascular;  Laterality: Right;  Right Femoral Artery Endarterectomy with vascu guard patch angioplasty & intraoperative arteriogram.  . Cardiac catheterization  07/28/2012    Native 3v CAD, continued patency of the SVG-OM1-LAD and SVG-OM2-left PDA. LVEF 40% with multiple WMAs  . Abdominal aortagram N/A 06/11/2011    Procedure: ABDOMINAL [name];  Surgeon: [name], MD;  Location: [facility] CATH LAB;  Service: Cardiovascular;  Laterality: N/A;  . Left heart catheterization with coronary angiogram N/A 07/28/2012    Procedure: LEFT HEART CATHETERIZATION WITH  CORONARY ANGIOGRAM;  Surgeon: [name], MD;  Location: [facility] CATH LAB;  Service: Cardiovascular;  Laterality: N/A;  . Colon surgery    . Cataract extraction, bilateral    . Coronary artery bypass graft  10/31/85 & 08/19/06  . Coronary angioplasty    . Cardiac catheterization N/A 03/08/2015    Procedure: Left Heart Cath and Coronary Angiography;  Surgeon: [name], MD;  Location: [facility] INVASIVE CV LAB;  Service: Cardiovascular;  Laterality: N/A;  . Eye surgery     Family History  Problem Relation Age of Onset  . Heart disease Father   . Cancer Mother   . COPD Mother   . Anesthesia problems Neg Hx   . Deep vein thrombosis Brother   . COPD Brother   . CAD Brother    Social History  Substance Use Topics  . Smoking status: Never Smoker   . Smokeless tobacco: Never Used  . Alcohol Use: No    Review of Systems 10/14 systems reviewed and are negative other than those stated in the HPI    Allergies  Novocain and Metoprolol  Home Medications   Prior to Admission medications   Medication Sig Start Date End Date Taking? Authorizing Provider  acetaminophen (TYLENOL) 500 MG tablet Take 1,000 mg by mouth every 6 (six) hours as needed for pain.    Historical Provider, MD  aspirin 81 MG tablet Take 81 mg by mouth daily.     Historical Provider, MD  atorvastatin (LIPITOR) 10 MG tablet Take 10 mg by mouth at bedtime.     Historical Provider, MD  clopidogrel (PLAVIX) 75 MG tablet Take 75 mg by mouth daily.  01/09/14   Historical Provider, MD  COLCRYS 0.6 MG tablet Take 0.6 mg by mouth daily as needed (gout).  04/17/11   Historical Provider, MD  ipratropium (ATROVENT) 0.03 % nasal spray Place 1 spray into the nose daily.     Historical Provider, MD  isosorbide mononitrate (IMDUR) 120 MG 24 hr tablet TAKE ONE TABLET BY MOUTH ONCE DAILY. 03/04/15   [name], MD  isosorbide mononitrate (IMDUR) 60 MG 24 hr tablet Take 1 tablet (60 mg total) by mouth daily. In addition to isosorbide 120mg   tab daily 07/28/15   Ok Anis, NP  losartan (COZAAR) 25 MG tablet Take 1 tablet (25 mg total) by mouth daily. 08/15/15   Beatrice Lecher, PA-C  Magnesium 400 MG TABS Take 400 mg by mouth 2 (two) times daily. 08/16/15   Beatrice Lecher, PA-C  mexiletine (MEXITIL) 200 MG capsule TAKE ONE CAPSULE BY MOUTH EVERY 12 HOURS 09/03/15   Duke Salvia, MD  Multiple Vitamins-Minerals (MULTIVITAMIN PO) Take 2 tablets by mouth daily.     Historical Provider, MD  nitroGLYCERIN (NITROSTAT) 0.4 MG SL tablet Place 0.4 mg under the tongue every 5 (five) minutes as needed for chest pain.    Historical Provider, MD  OVER  THE COUNTER MEDICATION Take 1 scoop by mouth daily. Barley life otc medication by aim.    Historical Provider, MD  pantoprazole (PROTONIX) 40 MG tablet TAKE ONE TABLET BY MOUTH ONCE DAILY 04/09/15   [name], MD  ranolazine (RANEXA) 500 MG 12 hr tablet Take 2 tablets (1,000 mg total) by mouth 2 (two) times daily. 05/07/15   [name], MD   BP 156/76 mmHg  Pulse 59  Temp(Src) 97.8 F (36.6 C) (Oral)  Resp 17  SpO2 98% Physical Exam Physical Exam  Nursing note and vitals reviewed. Constitutional: Elderly man, frail and cachectic appearing, non-toxic, and in no acute distress Head: Normocephalic and atraumatic.  Mouth/Throat: Oropharynx is clear and mucous membranes dry.  Neck: Normal range of motion. Neck supple.  Cardiovascular: Normal rate and regular rhythm.  No edema. +2 DP and radial pulses bilaterally Pulmonary/Chest: Effort normal and breath sounds normal.  Abdominal: Soft. There is no tenderness. There is no rebound and no guarding.  Musculoskeletal: Normal range of motion.  Neurological: Alert, no facial droop, fluent speech, moves all extremities symmetrically Skin: Skin is warm and dry.  Psychiatric: Cooperative  ED Course  Procedures (including critical care time) Labs Review Labs Reviewed  CBC WITH DIFFERENTIAL/PLATELET - Abnormal; Notable for the following:     RBC 3.44 (*)    Hemoglobin 11.7 (*)    HCT 36.6 (*)    MCV 106.4 (*)    Platelets 138 (*)    All other components within normal limits  COMPREHENSIVE METABOLIC PANEL - Abnormal; Notable for the following:    Glucose, Bld 140 (*)    BUN 26 (*)    Creatinine, Ser 1.49 (*)    Calcium 8.7 (*)    Total Protein 6.1 (*)    GFR calc non Af Amer 41 (*)    GFR calc Af Amer 47 (*)    All other components within normal limits  URINALYSIS, ROUTINE W REFLEX MICROSCOPIC (NOT AT [facility]) - Abnormal; Notable for the following:    Color, Urine AMBER (*)    All other components within normal limits  TSH  TROPONIN I  Rosezena Sensor, ED    Imaging Review Dg Chest 2 View  10/24/2015  CLINICAL DATA:  Syncope.  Weakness. EXAM: CHEST  2 VIEW COMPARISON:  07/26/2015 FINDINGS: There are stable changes from previous CABG surgery. Cardiac silhouette top-normal in size. The aorta is uncoiled. No mediastinal or hilar masses or evidence of adenopathy. Clear lungs.  No pleural effusion or pneumothorax. Bony thorax is demineralized but grossly intact. IMPRESSION: No acute cardiopulmonary disease. Electronically Signed   By: Amie Portland M.D.   On: 10/24/2015 11:02   Ct Head Wo Contrast  10/24/2015  CLINICAL DATA:  Dizziness, syncopal episode, generalized weakness, initially slurred speech which has now resolved, history hypertension, carotid artery disease, chronic systolic heart failure, coronary artery disease, ischemic cardiomyopathy, COPD EXAM: CT HEAD WITHOUT CONTRAST TECHNIQUE: Contiguous axial images were obtained from the base of the skull through the vertex without intravenous contrast. Sagittal and coronal MPR images reconstructed from axial data set. COMPARISON:  04/26/2015 FINDINGS: Generalized atrophy. Normal ventricular morphology. No midline shift or mass effect. Otherwise normal appearance of brain parenchyma. No intracranial hemorrhage, mass lesion, evidence acute infarction, or extra-axial fluid  collection. Hypoplastic mastoid air cells. Mucosal thickening paranasal sinuses. Atherosclerotic calcifications in internal carotid and vertebral arteries. No acute osseous findings. IMPRESSION: Generalized atrophy. No acute intracranial abnormalities. Electronically Signed   By: Angelyn Punt.D.  On: 10/24/2015 11:22   I have personally reviewed and evaluated these images and lab results as part of my medical decision-making.   EKG Interpretation   Date/Time:  Thursday October 24 2015 10:16:52 EDT Ventricular Rate:  64 PR Interval:  234 QRS Duration: 191 QT Interval:  551 QTC Calculation: 569 R Axis:   -39 Text Interpretation:  Sinus rhythm Prolonged PR interval IVCD, consider  atypical RBBB Similar to prior EKG  Confirmed by Verdine Grenfell MD, Katrenia Alkins ([id number]) on  10/24/2015 10:31:41 AM      MDM   Final diagnoses:  Nausea vomiting and diarrhea  Syncope and collapse    82 year old male who presents with syncope. He is in no acute distress on presentation, with stable vital signs. Mentating normally. Despite initial slurred speech reported by EMS, he at baseline now with normal neuro exam. Unlikely stroke and likely due to recovering from syncope.  He clinically appears dry, and I suspect that in the setting of this recent diarrhea and episode of vomiting that dehydration may have played a role in his syncopal episode today. EKG shows baseline right bundle branch block and first-degree A-V block without acute ischemic changes. He does have history of ischemic cardiomyopathy with an EF of 25%, and at high risk of cardiogenic syncope as well. Basic blood work overall with baseline renal function, no major electrolyte or metabolic derangements. Given age and risk factors for more serious etiology of syncope, will admit for observation on telemetry while getting rehydration.    Lavera Guise, MD 10/24/15 1644  Lavera Guise, MD 10/24/15 [phone]

## 2015-10-24 NOTE — Care Management Note (Signed)
Case Management Note  Patient Details  Name: MASAICHI VANACKER MRN: [id number] Date of Birth: [birth year]  Subjective/Objective:                  82 y.o. male with medical history significant for ischemic cardiomyopathy with an EF of 25%, hypertension, hyperlipidemia, abdominal aortic aneurysm, Crohn's disease, CAD, COPD, renal artery stenosis, presents to the emergency Department chief complaint of syncope. /From home with spouse.  Action/Plan: Follow for disposition needs.   Expected Discharge Date:  10/26/15               Expected Discharge Plan:  Home w Home Health Services  In-House Referral:     Discharge planning Services  CM Consult  Post Acute Care Choice:    Choice offered to:     DME Arranged:    DME Agency:     HH Arranged:    HH Agency:     Status of Service:  In process, will continue to follow  Medicare Important Message Given:    Date Medicare IM Given:    Medicare IM give by:    Date Additional Medicare IM Given:    Additional Medicare Important Message give by:     If discussed at Long Length of Stay Meetings, dates discussed:    Additional Comments:  Oletta Cohn, RN 10/24/2015, 3:13 PM

## 2015-10-24 NOTE — H&P (Signed)
History and Physical    [name]:[id number] DOB: 1926/09/29 DOA: 10/24/2015  PCP: Garlan Fillers, MD Patient coming from: home  Chief Complaint: syncope  HPI: Karl Stevenson is a delightful 82 y.o. male with medical history significant for ischemic cardiomyopathy with an EF of 25%, hypertension, hyperlipidemia, abdominal aortic aneurysm, Crohn's disease, CAD, COPD, renal artery stenosis, presents to the emergency Department chief complaint of syncope. Initial evaluation in the emergency department reveals dehydration.  Information is obtained from the patient and the chart. He reports 2 days ago he had 6 or more episodes of watery stool. He denies any abdominal pain nausea vomiting at that time. Addition he states he was not eating and drinking his normal amount. Yesterday he reports feeling "weak" and felt he was "dehydrated". He reports trying to increase his oral intake and feels he was unable to accomplish this. This morning he awakened and felt much better and was eating breakfast. Suddenly he felt nauseated had one episode of vomiting and his next memory is waken up with EMS standing in front of him. He denies any chest pain palpitation headache dizziness visual disturbances numbness tingling of extremities. He denies any abdominal pain dysuria hematuria frequency or urgency. Denies lower extremity edema orthopnea fever chills recent travel or sick contacts.    ED Course: In the emergency department he is provided with IV fluids.  Review of Systems: As per HPI otherwise 10 point review of systems negative.   Ambulatory Status: He uses a cane or walker. He denies any recent falls.  Past Medical History  Diagnosis Date  . Hyperlipidemia   . Hypertension   . Carotid artery occlusion     a. s/p L CEA in 2010;  b. 06/2014 Carotid U/S: stable LICA CEA site w/o significant stenosis bilat.  . Crohn's disease (HCC)     a. s/p colon resection  . Bradycardia     a. previous  intolerance to beta blockers - low dose toprol started 10/2011 for tachycardia but had rash and was discontinued;  b. tolerating low dose propranolol.  . Chronic systolic heart failure (HCC)     a. 02/2015 Echo:  35-40% (echo 11/2010); c. 02/2015 Echo: EF 20-25%, diff HK, mid-apicalanteroseptal and apical AK, Gr 1 DD, mild MR.  . Ischemic cardiomyopathy     a. EF 34% (Lexiscan 06/2011); b. 35-40% (echo 11/2010); c. 02/2015 Echo: EF 20-25%, diff HK, mid-apicalanteroseptal and apical AK, Gr 1 DD, mild MR.  . Coronary artery disease     a. 1969 s/p MI;  b. S/P CABG 1987;  c. S/P redo 2008;  d. 07/2012 Cath: stable->Med Rx, EF 40%; e. 02/2014 MV: anterior and posterolateral/apical infarct with mild peri-infarct ischemia->Med Rx; f. 02/2015 Cath: LM 100d, LAD 100ost, LCX 100ost, OM2 100, VG->OM3->OM4 nl, VG-> LAD nl (Y from VG->OM), VG->OM2 100p.  [name] Peripheral vascular disease (HCC)     a. Carotid dz s/p L CEA, RAS, AAA, LE dz  . AAA (abdominal aortic aneurysm) (HCC)     a. Ectatic abdominal aorta with fusiform dilitation 3.4x3.4 and moderate thrombus burden 12/2010  . RBBB   . Tachycardia     a. 10/2011 - atrial tach vs avnrt - BB started.  [name] COPD (chronic obstructive pulmonary disease) (HCC)   . Ventricular tachycardia (HCC)     a. 02/2015 ->amio 200 daily added-->later switched to mexilitene.  . Gout   . Renal artery stenosis (HCC)     a. Dopplers 12/2010 - 1-59% R ostial  renal artery stenosis, 2 L renal arteries both widely patent  . Syncope     Past Surgical History  Procedure Laterality Date  . Carotid endarterectomy Left 2010  . Colon resection  2008  . Tonsillectomy      as a child  . Pr vein bypass graft,aorto-fem-pop  10/1985  . Femoral endarterectomy Left  06/2011    ileofemoral endarterectomy with bovine patch angioplasty  . Endarterectomy  08/26/2011    Procedure: ENDARTERECTOMY ILIAC;  Surgeon: [name], MD;  [facility] OR;  Service: Vascular;  Laterality: Right;  Right Femoral  Artery Endarterectomy with vascu guard patch angioplasty & intraoperative arteriogram.  . Cardiac catheterization  07/28/2012    Native 3v CAD, continued patency of the SVG-OM1-LAD and SVG-OM2-left PDA. LVEF 40% with multiple WMAs  . Abdominal aortagram N/A 06/11/2011    Procedure: ABDOMINAL [name];  Surgeon: [name], MD;  Location: [facility] CATH LAB;  Service: Cardiovascular;  Laterality: N/A;  . Left heart catheterization with coronary angiogram N/A 07/28/2012    Procedure: LEFT HEART CATHETERIZATION WITH CORONARY ANGIOGRAM;  Surgeon: [name], MD;  Location: [facility] CATH LAB;  Service: Cardiovascular;  Laterality: N/A;  . Colon surgery    . Cataract extraction, bilateral    . Coronary artery bypass graft  10/31/85 & 08/19/06  . Coronary angioplasty    . Cardiac catheterization N/A 03/08/2015    Procedure: Left Heart Cath and Coronary Angiography;  Surgeon: [name], MD;  Location: [facility] INVASIVE CV LAB;  Service: Cardiovascular;  Laterality: N/A;  . Eye surgery      Social History   Social History  . Marital Status: Married    Spouse Name: N/A  . Number of Children: N/A  . Years of Education: N/A   Occupational History  . Not on file.   Social History Main Topics  . Smoking status: Never Smoker   . Smokeless tobacco: Never Used  . Alcohol Use: No  . Drug Use: No  . Sexual Activity: Not on file   Other Topics Concern  . Not on file   Social History Narrative  He lives at home with his wife has done so for the last 15 years. He uses a cane or walker for ambulation. He is a retired Airline pilot. He is independent with ADLs  Allergies  Allergen Reactions  . Novocain [Procaine Hcl] Other (See Comments)    Cold sweats and very bad headache.   . Metoprolol Hives, Itching and Rash    Family History  Problem Relation Age of Onset  . Heart disease Father   . Cancer Mother   . COPD Mother   . Anesthesia problems Neg Hx   . Deep vein thrombosis Brother   . COPD Brother     . CAD Brother     Prior to Admission medications   Medication Sig Start Date End Date Taking? Authorizing Provider  acetaminophen (TYLENOL) 500 MG tablet Take 1,000 mg by mouth every 6 (six) hours as needed for pain.    Historical Provider, MD  aspirin 81 MG tablet Take 81 mg by mouth daily.     Historical Provider, MD  atorvastatin (LIPITOR) 10 MG tablet Take 10 mg by mouth at bedtime.     Historical Provider, MD  clopidogrel (PLAVIX) 75 MG tablet Take 75 mg by mouth daily.  01/09/14   Historical Provider, MD  COLCRYS 0.6 MG tablet Take 0.6 mg by mouth daily as needed (gout).  04/17/11   Historical Provider,  MD  ipratropium (ATROVENT) 0.03 % nasal spray Place 1 spray into the nose daily.     Historical Provider, MD  isosorbide mononitrate (IMDUR) 120 MG 24 hr tablet TAKE ONE TABLET BY MOUTH ONCE DAILY. 03/04/15   [name], MD  isosorbide mononitrate (IMDUR) 60 MG 24 hr tablet Take 1 tablet (60 mg total) by mouth daily. In addition to isosorbide 120mg  tab daily 07/28/15   Ok Anis, NP  losartan (COZAAR) 25 MG tablet Take 1 tablet (25 mg total) by mouth daily. 08/15/15   Beatrice Lecher, PA-C  Magnesium 400 MG TABS Take 400 mg by mouth 2 (two) times daily. 08/16/15   Beatrice Lecher, PA-C  mexiletine (MEXITIL) 200 MG capsule TAKE ONE CAPSULE BY MOUTH EVERY 12 HOURS 09/03/15   Duke Salvia, MD  Multiple Vitamins-Minerals (MULTIVITAMIN PO) Take 2 tablets by mouth daily.     Historical Provider, MD  nitroGLYCERIN (NITROSTAT) 0.4 MG SL tablet Place 0.4 mg under the tongue every 5 (five) minutes as needed for chest pain.    Historical Provider, MD  OVER THE COUNTER MEDICATION Take 1 scoop by mouth daily. Barley life otc medication by aim.    Historical Provider, MD  pantoprazole (PROTONIX) 40 MG tablet TAKE ONE TABLET BY MOUTH ONCE DAILY 04/09/15   [name], MD  ranolazine (RANEXA) 500 MG 12 hr tablet Take 2 tablets (1,000 mg total) by mouth 2 (two) times daily. 05/07/15   [name], MD    Physical Exam: Filed Vitals:   10/24/15 1200 10/24/15 1215 10/24/15 1230 10/24/15 1245  BP: 134/69 142/72 138/70 119/63  Pulse: 59 61 57 58  Temp:      TempSrc:      Resp: 20 17 15 15   SpO2: 100% 100% 99% 98%     General:  Appears calm and comfortable, somewhat thin and frail appearing Eyes:  PERRL, EOMI, normal lids, iris ENT:  grossly normal hearing, lips & tongue, mucous membranes of his mouth are pink but dry Neck:  no LAD, masses or thyromegaly Cardiovascular:  RRR, no m/r/g. No LE edema. Pedal pulses present and palpable Respiratory:  CTA bilaterally, no w/r/r. Normal respiratory effort. Abdomen:  soft, ntnd, positive bowel sounds throughout no guarding or rebounding Skin:  no rash or induration seen on limited exam Musculoskeletal:  grossly poor tone BUE/BLE, good ROM, no bony abnormality is without swelling/erythema Psychiatric:  grossly normal mood and affect, speech fluent and appropriate, AOx3 Neurologic:  CN 2-12 grossly intact, moves all extremities in coordinated fashion, sensation intact speech clear facial symmetry  Labs on Admission: I have personally reviewed following labs and imaging studies  CBC:  Recent Labs Lab 10/24/15 1056  WBC 6.7  NEUTROABS 5.2  HGB 11.7*  HCT 36.6*  MCV 106.4*  PLT 138*   Basic Metabolic Panel:  Recent Labs Lab 10/24/15 1056  NA 140  K 4.0  CL 110  CO2 25  GLUCOSE 140*  BUN 26*  CREATININE 1.49*  CALCIUM 8.7*   GFR: CrCl cannot be calculated (Unknown ideal weight.). Liver Function Tests:  Recent Labs Lab 10/24/15 1056  AST 21  ALT 17  ALKPHOS 59  BILITOT 0.5  PROT 6.1*  ALBUMIN 3.5   No results for input(s): LIPASE, AMYLASE in the last 168 hours. No results for input(s): AMMONIA in the last 168 hours. Coagulation Profile: No results for input(s): INR, PROTIME in the last 168 hours. Cardiac Enzymes: No results for input(s): CKTOTAL, CKMB, CKMBINDEX, TROPONINI in  the last 168 hours. BNP  (last 3 results) No results for input(s): PROBNP in the last 8760 hours. HbA1C: No results for input(s): HGBA1C in the last 72 hours. CBG: No results for input(s): GLUCAP in the last 168 hours. Lipid Profile: No results for input(s): CHOL, HDL, LDLCALC, TRIG, CHOLHDL, LDLDIRECT in the last 72 hours. Thyroid Function Tests: No results for input(s): TSH, T4TOTAL, FREET4, T3FREE, THYROIDAB in the last 72 hours. Anemia Panel: No results for input(s): VITAMINB12, FOLATE, FERRITIN, TIBC, IRON, RETICCTPCT in the last 72 hours. Urine analysis:    Component Value Date/Time   COLORURINE AMBER* 04/27/2015 0153   APPEARANCEUR CLEAR 04/27/2015 0153   LABSPEC 1.024 04/27/2015 0153   PHURINE 5.0 04/27/2015 0153   GLUCOSEU NEGATIVE 04/27/2015 0153   HGBUR NEGATIVE 04/27/2015 0153   BILIRUBINUR NEGATIVE 04/27/2015 0153   KETONESUR NEGATIVE 04/27/2015 0153   PROTEINUR NEGATIVE 04/27/2015 0153   UROBILINOGEN 0.2 02/10/2013 0059   NITRITE NEGATIVE 04/27/2015 0153   LEUKOCYTESUR TRACE* 04/27/2015 0153    Creatinine Clearance: CrCl cannot be calculated (Unknown ideal weight.).  Sepsis Labs: (procalcitonin:4,lacticidven:4) )No results found for this or any previous visit (from the past 240 hour(s)).   Radiological Exams on Admission: Dg Chest 2 View  10/24/2015  CLINICAL DATA:  Syncope.  Weakness. EXAM: CHEST  2 VIEW COMPARISON:  07/26/2015 FINDINGS: There are stable changes from previous CABG surgery. Cardiac silhouette top-normal in size. The aorta is uncoiled. No mediastinal or hilar masses or evidence of adenopathy. Clear lungs.  No pleural effusion or pneumothorax. Bony thorax is demineralized but grossly intact. IMPRESSION: No acute cardiopulmonary disease. Electronically Signed   By: Amie Portland M.D.   On: 10/24/2015 11:02   Ct Head Wo Contrast  10/24/2015  CLINICAL DATA:  Dizziness, syncopal episode, generalized weakness, initially slurred speech which has now resolved, history  hypertension, carotid artery disease, chronic systolic heart failure, coronary artery disease, ischemic cardiomyopathy, COPD EXAM: CT HEAD WITHOUT CONTRAST TECHNIQUE: Contiguous axial images were obtained from the base of the skull through the vertex without intravenous contrast. Sagittal and coronal MPR images reconstructed from axial data set. COMPARISON:  04/26/2015 FINDINGS: Generalized atrophy. Normal ventricular morphology. No midline shift or mass effect. Otherwise normal appearance of brain parenchyma. No intracranial hemorrhage, mass lesion, evidence acute infarction, or extra-axial fluid collection. Hypoplastic mastoid air cells. Mucosal thickening paranasal sinuses. Atherosclerotic calcifications in internal carotid and vertebral arteries. No acute osseous findings. IMPRESSION: Generalized atrophy. No acute intracranial abnormalities. Electronically Signed   By: Ulyses Southward M.D.   On: 10/24/2015 11:22    EKG: Independently reviewed. Sinus rhythm Prolonged PR interval IVCD, consider atypical RBBB  Assessment/Plan Principal Problem:   Syncope Active Problems:   Anemia   CAD (coronary artery disease)   HTN (hypertension)   PVD (peripheral vascular disease) (HCC)   Ischemic cardiomyopathy   Chronic systolic heart failure (HCC)   CKD (chronic kidney disease), stage III   Dehydration   Nausea and vomiting   Diarrhea   #1. Syncope. Likely related to vasovagal response setting of nausea/vomiting, complicated by dehydration secondary to recent diarrhea and decreased oral intake. CT of the head with generalized atrophy no acute intracranial abnormalities. Chest x-ray with no acute cardiopulmonary disease, urinalysis unremarkable, initial troponin is negative EKG without acute changes. Basic metabolic panel at his baseline without derangement, CBC at his baseline. Medications reviewed no recent changes He is afebrile hemodynamically stable and not hypoxic.  -Admit to telemetry -Gentle IV  fluids -Obtain orthostatics -cycle troponin -hold  sedating med -recheck orthostatic in am  #2. Nausea and vomiting. One episode this morning. Likely related to dehydration secondary to recent diarrhea and decreased oral intake. Resolved on admission. -anti-emetic -iv fluids -diet as tolerated  3. Diarrhea. Patient with a history of stones disease with diarrhea. Several episodes 2 days ago. No further episodes. He is afebrile no leukocytosis recent antibiotic use -See #1 and #2 -If any further episodes will send stool for GI pathogen -Monitor output  #4. Chronic kidney disease stage III. Creatinine 1.49. Chart review indicates this is close to his baseline. -We'll hold nephrotoxins -Gentle IV fluids as noted above -Monitor urine output -Recheck in the morning  #5. Chronic systolic heart failure. Recent echo in October 2016 reveals systolic function was severely reduced. The estimated ejection fraction was in the  range of 20% to 25%. Diffuse hypokinesis. There is akinesis of the mid-apicalanteroseptal and apical myocardium. Doppler  parameters are consistent with abnormal left ventricular relaxation (grade 1 diastolic dysfunction). Home medications include imdur,losartan -Hold losartan for now -Monitor blood pressure closely -Resume losartan when indicated -Obtain daily weights -Monitor intake and output  6. CAD status post CABG 1987 and a redo in 2008. Denies chest pain. Initial troponin negative. EKG without acute changes. Was admitted March of this year with chest pain and questionable loss of consciousness. Recent visit to cardiology note indicates recent cardiac cath in October 2016 with stable anatomy. Decision made to continue medical therapy -monitor on telemetry -Cycle troponin 1 -Continue home meds  #7. Htn. Fair control in the emergency department. Home medications as noted above. -Obtain orthostatics on admission and in the morning -Continue home meds -Monitor blood  pressure closely    DVT prophylaxis:  plavix Code Status: full Family Communication: none  Disposition Plan: home  Consults called: none  Admission status: obs    Toya Smothers M MD [facility]  If 7PM-7AM, please contact night-coverage [email or web address] Password [email or web address]  10/24/2015, 2:24 PM

## 2015-10-25 ENCOUNTER — Other Ambulatory Visit: Payer: Self-pay

## 2015-10-25 DIAGNOSIS — R55 Syncope and collapse: Secondary | ICD-10-CM | POA: Diagnosis not present

## 2015-10-25 DIAGNOSIS — I951 Orthostatic hypotension: Secondary | ICD-10-CM

## 2015-10-25 DIAGNOSIS — E860 Dehydration: Secondary | ICD-10-CM | POA: Diagnosis not present

## 2015-10-25 DIAGNOSIS — I5022 Chronic systolic (congestive) heart failure: Secondary | ICD-10-CM | POA: Diagnosis not present

## 2015-10-25 DIAGNOSIS — I472 Ventricular tachycardia: Secondary | ICD-10-CM

## 2015-10-25 DIAGNOSIS — R112 Nausea with vomiting, unspecified: Secondary | ICD-10-CM | POA: Diagnosis not present

## 2015-10-25 DIAGNOSIS — I2510 Atherosclerotic heart disease of native coronary artery without angina pectoris: Secondary | ICD-10-CM | POA: Diagnosis not present

## 2015-10-25 DIAGNOSIS — I255 Ischemic cardiomyopathy: Secondary | ICD-10-CM | POA: Diagnosis not present

## 2015-10-25 DIAGNOSIS — R197 Diarrhea, unspecified: Secondary | ICD-10-CM | POA: Diagnosis not present

## 2015-10-25 LAB — BASIC METABOLIC PANEL
Anion gap: 6 (ref 5–15)
BUN: 18 mg/dL (ref 6–20)
CHLORIDE: 111 mmol/L (ref 101–111)
CO2: 23 mmol/L (ref 22–32)
Calcium: 8.4 mg/dL — ABNORMAL LOW (ref 8.9–10.3)
Creatinine, Ser: 1.27 mg/dL — ABNORMAL HIGH (ref 0.61–1.24)
GFR calc non Af Amer: 49 mL/min — ABNORMAL LOW (ref >60)
GFR, EST AFRICAN AMERICAN: 57 mL/min — AB (ref >60)
Glucose, Bld: 84 mg/dL (ref 65–99)
POTASSIUM: 3.8 mmol/L (ref 3.5–5.1)
SODIUM: 140 mmol/L (ref 135–145)

## 2015-10-25 LAB — GLUCOSE, CAPILLARY: Glucose-Capillary: 95 mg/dL (ref 65–99)

## 2015-10-25 LAB — CBC
HEMATOCRIT: 33.9 % — AB (ref 39.0–52.0)
HEMOGLOBIN: 10.9 g/dL — AB (ref 13.0–17.0)
MCH: 33.4 pg (ref 26.0–34.0)
MCHC: 32.2 g/dL (ref 30.0–36.0)
MCV: 104 fL — AB (ref 78.0–100.0)
Platelets: 127 10*3/uL — ABNORMAL LOW (ref 150–400)
RBC: 3.26 MIL/uL — AB (ref 4.22–5.81)
RDW: 13 % (ref 11.5–15.5)
WBC: 5 10*3/uL (ref 4.0–10.5)

## 2015-10-25 NOTE — Evaluation (Signed)
Physical Therapy Evaluation Patient Details Name: Karl Stevenson MRN: [id number] DOB: Jul 06, 1926 Today's Date: 10/25/2015   History of Present Illness  Pt adm with syncope. PMH - Chron's, HTN, CAD, PVD  Clinical Impression  Pt doing well with mobility and no further PT needed.  Ready for dc from PT standpoint.      Follow Up Recommendations No PT follow up    Equipment Recommendations  None recommended by PT    Recommendations for Other Services       Precautions / Restrictions Precautions Precautions: None Restrictions Weight Bearing Restrictions: No      Mobility  Bed Mobility Overal bed mobility: Independent                Transfers Overall transfer level: Modified independent                  Ambulation/Gait Ambulation/Gait assistance: Modified independent (Device/Increase time) Ambulation Distance (Feet): 400 Feet Assistive device: Straight cane Gait Pattern/deviations: Step-through pattern;Decreased stride length     General Gait Details: Steady gait with walker  Stairs            Wheelchair Mobility    Modified Rankin (Stroke Patients Only)       Balance Overall balance assessment: Modified Independent                                           Pertinent Vitals/Pain Pain Assessment: No/denies pain    Home Living Family/patient expects to be discharged to:: Private residence Living Arrangements: Spouse/significant other Available Help at Discharge: Family;Available 24 hours/day Type of Home: House Home Access: Stairs to enter   [employer] of Steps: 2 Home Layout: One level Home Equipment: Cane - single point      Prior Function Level of Independence: Independent with assistive device(s)         Comments: Uses cane or rolling walker     Hand Dominance   Dominant Hand: Right    Extremity/Trunk Assessment   Upper Extremity Assessment: Overall WFL for tasks assessed            Lower Extremity Assessment: Overall WFL for tasks assessed         Communication   Communication: No difficulties  Cognition Arousal/Alertness: Awake/alert Behavior During Therapy: WFL for tasks assessed/performed Overall Cognitive Status: Within Functional Limits for tasks assessed                      General Comments      Exercises        Assessment/Plan    PT Assessment Patent does not need any further PT services  PT Diagnosis Difficulty walking   PT Problem List    PT Treatment Interventions     PT Goals (Current goals can be found in the Care Plan section) Acute Rehab PT Goals PT Goal Formulation: All assessment and education complete, DC therapy    Frequency     Barriers to discharge        Co-evaluation               End of Session   Activity Tolerance: Patient tolerated treatment well Patient left: in chair;with call bell/phone within reach Nurse Communication: Mobility status    Functional Assessment Tool Used: clinical judgement Functional Limitation: Mobility: Walking and moving around Mobility: Walking and Moving Around Current Status ([id number]):  At least 1 percent but less than 20 percent impaired, limited or restricted Mobility: Walking and Moving Around Goal Status [phone]): At least 1 percent but less than 20 percent impaired, limited or restricted Mobility: Walking and Moving Around Discharge Status [phone]): At least 1 percent but less than 20 percent impaired, limited or restricted    Time: 1006-1014 PT Time Calculation (min) (ACUTE ONLY): 8 min   Charges:   PT Evaluation $PT Eval Low Complexity: 1 Procedure     PT G Codes:   PT G-Codes **NOT FOR INPATIENT CLASS** Functional Assessment Tool Used: clinical judgement Functional Limitation: Mobility: Walking and moving around Mobility: Walking and Moving Around Current Status ([id number]): At least 1 percent but less than 20 percent impaired, limited or restricted Mobility:  Walking and Moving Around Goal Status [phone]): At least 1 percent but less than 20 percent impaired, limited or restricted Mobility: Walking and Moving Around Discharge Status [phone]): At least 1 percent but less than 20 percent impaired, limited or restricted    [facility] 10/25/2015, 10:49 AM [facility] PT [phone]

## 2015-10-25 NOTE — Progress Notes (Signed)
Pt has orders to be discharged. Discharge instructions given and pt has no additional questions at this time. Medication regimen reviewed and pt educated. Pt verbalized understanding and has no additional questions. Telemetry box removed. IV removed and site in good condition. Pt stable and waiting for transportation.   Felesia Stahlecker RN 

## 2015-10-25 NOTE — Discharge Summary (Signed)
Physician Discharge Summary  Karl Stevenson WGN:[id number] DOB: 06-17-1926 DOA: 10/24/2015  PCP: Garlan Fillers, MD  Admit date: 10/24/2015 Discharge date: 10/25/2015  Recommendations for Outpatient Follow-up:  1. Dr. Excell Seltzer on Monday at already scheduled appointment 2. Dr. Graciela Husbands as needed  Discharge Diagnoses:  Principal Problem:   Syncope Active Problems:   Anemia   CAD (coronary artery disease)   HTN (hypertension)   PVD (peripheral vascular disease) (HCC)   Ischemic cardiomyopathy   Chronic systolic heart failure (HCC)   CKD (chronic kidney disease), stage III   Ventricular tachycardia (HCC)   Dehydration   Nausea and vomiting   Diarrhea   Orthostatic hypotension   Discharge Condition: stable, improved  Diet recommendation: healthy heart  Wt Readings from Last 3 Encounters:  10/25/15 58.378 kg (128 lb 11.2 oz)  08/15/15 60.056 kg (132 lb 6.4 oz)  07/28/15 58.786 kg (129 lb 9.6 oz)    History of present illness:   Karl Stevenson is a delightful 82 y.o. male with medical history significant for ischemic cardiomyopathy with an EF of 25%, hypertension, hyperlipidemia, abdominal aortic aneurysm, Crohn's disease, CAD, COPD, renal artery stenosis, presents to the emergency Department chief complaint of syncope. Initial evaluation in the emergency department reveals dehydration.  Information is obtained from the patient and the chart. He reports 2 days ago he had 6 or more episodes of watery stool. He denies any abdominal pain nausea vomiting at that time. Addition he states he was not eating and drinking his normal amount. Yesterday he reports feeling "weak" and felt he was "dehydrated". He reports trying to increase his oral intake and feels he was unable to accomplish this. This morning he awakened and felt much better and was eating breakfast. Suddenly he felt nauseated had one episode of vomiting and his next memory is waken up with EMS standing in front of him. He  denies any chest pain palpitation headache dizziness visual disturbances numbness tingling of extremities. He denies any abdominal pain dysuria hematuria frequency or urgency. Denies lower extremity edema orthopnea fever chills recent travel or sick contacts.  Hospital Course:   Syncope, may have been a vasovagal response secondary to nausea/vomiting.  He recently had diarrhea and may have been still a little orthostatic.  He feels that this episode is similar to his "VT" for which he sees Dr. Graciela Husbands.  He previously had similar but less severe episodes about 2-3 times per week, but prior to this fainting episode, he had not had an episode in over three weeks.  He typically experiences suddenly lightheadedness with associated nausea and severe weakness.  Normally, he is able to get to his recliner to rest and they resolve after several minutes.  On the date of admission, he waited too long to get to his chair and when he tried to stand up from the kitchen table, he fainted.  CT of the head with generalized atrophy no acute intracranial abnormalities. Chest x-ray with no acute cardiopulmonary disease, urinalysis unremarkable.  Basic metabolic panel at his baseline without derangement, CBC at his baseline.  ECG unchanged.  No recent medication changes.    -  Telemetry:  NSR with wide QRS -  VT was not documented by EMS when they arrived, nor did we capture any episodes during his hospitalization.  He declined ICD placement in the past.  Case was discussed with Dr. Graciela Husbands who recommended that he follow up with his already scheduled appointment with Dr. Excell Seltzer on Monday.  No medication changes  were made.  His mexiletine dose was kept this same. -  Troponins negative -  Orthostatics negative -  IVF helped him feel better -  Patient worked with PT who recommended no follow up  -    Nausea and vomiting.  May have been due to vasovagal response or arrhythmia.   Resolved on admission.  Tolerated diet.  Diarrhea,  likely infectious and resolved.  Although there is a documented history of Crohn's, this was only a suspected diagnosis and colonoscopy did NOT confirm Crohn's.  His symptoms have completely resolved and he had a normal bowel movement on the date of admission.    Chronic kidney disease stage III. Creatinine 1.49. Chart review indicates this is close to his baseline.  Chronic systolic heart failure. Recent echo in October 2016 reveals systolic function was in the range of 20% to 25%. Diffuse hypokinesis. There is akinesis of the mid-apicalanteroseptal and apical myocardium. Doppler parameters are consistent with abnormal left ventricular relaxation (grade 1 diastolic dysfunction).  He continued ARB.  He is not on beta blocker (hx of bradycardia) or diuretic. Defer to cardiology.  CAD status post CABG 1987 and a redo in 2008. Denied chest pain.  Troponins negative. EKG without acute changes. Was admitted March of this year with chest pain and questionable loss of consciousness. Recent visit to cardiology note indicates recent cardiac cath in October 2016 with stable anatomy. Decision made to continue medical therapy.   -  Continued imdur, ranexa, aspirin, plavix and atorvastatin.    Htn, blood pressure remained stable.   Anemia of chronic renal disease, hemoglobin near baseline  Mild chronic thrombocytopenia, near baseline.    Procedures:  none  Consultations:  Case discussed by phone with Dr. Graciela Husbands  Discharge Exam: Filed Vitals:   10/25/15 0133 10/25/15 0619  BP: 142/78 131/67  Pulse: 81 72  Temp: 98 F (36.7 C) 98.5 F (36.9 C)  Resp: 18 16   Filed Vitals:   10/24/15 1712 10/24/15 2218 10/25/15 0133 10/25/15 0619  BP: 179/87 149/63 142/78 131/67  Pulse:  71 81 72  Temp:  97.9 F (36.6 C) 98 F (36.7 C) 98.5 F (36.9 C)  TempSrc:  Oral Oral Oral  Resp:  18 18 16   Height:      Weight:    58.378 kg (128 lb 11.2 oz)  SpO2:  99% 99% 98%    General: adult male, no acute  distress  Cardiovascular: RRR, no gallop Respiratory: CTAB ABD:  NABS, soft, NT/ND MSK:  No lower extremity edema, normal tone and bulk Neuro:  CN II-XII grossly intact, strength 5/5 throughout, sensation intact to light touch.  Physically fit for age despite comorbidities.    Discharge Instructions      Discharge Instructions    Call MD for:  difficulty breathing, headache or visual disturbances    Complete by:  As directed      Call MD for:  extreme fatigue    Complete by:  As directed      Call MD for:  hives    Complete by:  As directed      Call MD for:  persistant dizziness or light-headedness    Complete by:  As directed      Call MD for:  persistant nausea and vomiting    Complete by:  As directed      Call MD for:  severe uncontrolled pain    Complete by:  As directed      Call MD for:  temperature >100.4    Complete by:  As directed      Diet - low sodium heart healthy    Complete by:  As directed      Driving Restrictions    Complete by:  As directed   No driving, operating heavy machinery, bathing in bathtub or swimming unsupervised until cleared by your doctor.     Increase activity slowly    Complete by:  As directed             Medication List    TAKE these medications        acetaminophen 500 MG tablet  Commonly known as:  TYLENOL  Take 1,000 mg by mouth every 6 (six) hours as needed for pain.     aspirin 81 MG tablet  Take 81 mg by mouth daily.     atorvastatin 10 MG tablet  Commonly known as:  LIPITOR  Take 10 mg by mouth at bedtime.     clopidogrel 75 MG tablet  Commonly known as:  PLAVIX  Take 75 mg by mouth daily.     COLCRYS 0.6 MG tablet  Generic drug:  colchicine  Take 0.6 mg by mouth daily as needed (gout).     ipratropium 0.03 % nasal spray  Commonly known as:  ATROVENT  Place 1 spray into the nose daily.     isosorbide mononitrate 120 MG 24 hr tablet  Commonly known as:  IMDUR  TAKE ONE TABLET BY MOUTH ONCE DAILY.      isosorbide mononitrate 60 MG 24 hr tablet  Commonly known as:  IMDUR  Take 1 tablet (60 mg total) by mouth daily. In addition to isosorbide  tab daily     losartan 25 MG tablet  Commonly known as:  COZAAR  Take 1 tablet (25 mg total) by mouth daily.     Magnesium 400 MG Tabs  Take 400 mg by mouth 2 (two) times daily.     mexiletine 200 MG capsule  Commonly known as:  MEXITIL  TAKE ONE CAPSULE BY MOUTH EVERY 12 HOURS     MULTIVITAMIN PO  Take 2 tablets by mouth daily.     nitroGLYCERIN 0.4 MG SL tablet  Commonly known as:  NITROSTAT  Place 0.4 mg under the tongue every 5 (five) minutes as needed for chest pain.     OVER THE COUNTER MEDICATION  Take 1 scoop by mouth daily. Barley life otc medication by aim.     pantoprazole 40 MG tablet  Commonly known as:  PROTONIX  TAKE ONE TABLET BY MOUTH ONCE DAILY     ranolazine 500 MG 12 hr tablet  Commonly known as:  RANEXA  Take 2 tablets (1,000 mg total) by mouth 2 (two) times daily.       Follow-up Information    Schedule an appointment as soon as possible for a visit with Garlan Fillers, MD.   Specialty:  Internal Medicine   Why:  As needed   Contact information:   [address] [place] [address] [phone]       Follow up with [name], MD On 10/28/2015.   Specialty:  Cardiology   Why:  already scheduled appointment   Contact information:   [address] [place] [address] [phone]       Follow up with Sherryl Manges, MD.   Specialty:  Cardiology   Why:  As needed   Contact information:   [address]  [place] [address] [phone]        The results of significant diagnostics from this hospitalization (including imaging, microbiology, ancillary and laboratory) are listed below for reference.    Significant Diagnostic Studies: Dg Chest 2 View  10/24/2015  CLINICAL DATA:  Syncope.  Weakness. EXAM: CHEST  2 VIEW COMPARISON:  07/26/2015  FINDINGS: There are stable changes from previous CABG surgery. Cardiac silhouette top-normal in size. The aorta is uncoiled. No mediastinal or hilar masses or evidence of adenopathy. Clear lungs.  No pleural effusion or pneumothorax. Bony thorax is demineralized but grossly intact. IMPRESSION: No acute cardiopulmonary disease. Electronically Signed   By: Amie Portland M.D.   On: 10/24/2015 11:02   Ct Head Wo Contrast  10/24/2015  CLINICAL DATA:  Dizziness, syncopal episode, generalized weakness, initially slurred speech which has now resolved, history hypertension, carotid artery disease, chronic systolic heart failure, coronary artery disease, ischemic cardiomyopathy, COPD EXAM: CT HEAD WITHOUT CONTRAST TECHNIQUE: Contiguous axial images were obtained from the base of the skull through the vertex without intravenous contrast. Sagittal and coronal MPR images reconstructed from axial data set. COMPARISON:  04/26/2015 FINDINGS: Generalized atrophy. Normal ventricular morphology. No midline shift or mass effect. Otherwise normal appearance of brain parenchyma. No intracranial hemorrhage, mass lesion, evidence acute infarction, or extra-axial fluid collection. Hypoplastic mastoid air cells. Mucosal thickening paranasal sinuses. Atherosclerotic calcifications in internal carotid and vertebral arteries. No acute osseous findings. IMPRESSION: Generalized atrophy. No acute intracranial abnormalities. Electronically Signed   By: Ulyses Southward M.D.   On: 10/24/2015 11:22    Microbiology: No results found for this or any previous visit (from the past 240 hour(s)).   Labs: Basic Metabolic Panel:  Recent Labs Lab 10/24/15 1056 10/25/15 0355  NA 140 140  K 4.0 3.8  CL 110 111  CO2 25 23  GLUCOSE 140* 84  BUN 26* 18  CREATININE 1.49* 1.27*  CALCIUM 8.7* 8.4*   Liver Function Tests:  Recent Labs Lab 10/24/15 1056  AST 21  ALT 17  ALKPHOS 59  BILITOT 0.5  PROT 6.1*  ALBUMIN 3.5   No results for  input(s): LIPASE, AMYLASE in the last 168 hours. No results for input(s): AMMONIA in the last 168 hours. CBC:  Recent Labs Lab 10/24/15 1056 10/25/15 0355  WBC 6.7 5.0  NEUTROABS 5.2  --   HGB 11.7* 10.9*  HCT 36.6* 33.9*  MCV 106.4* 104.0*  PLT 138* 127*   Cardiac Enzymes:  Recent Labs Lab 10/24/15 1936  TROPONINI 0.03   BNP: BNP (last 3 results) No results for input(s): BNP in the last 8760 hours.  ProBNP (last 3 results) No results for input(s): PROBNP in the last 8760 hours.  CBG:  Recent Labs Lab 10/25/15 0617  GLUCAP 95    Time coordinating discharge: 35 minutes  Signed:  Zayda Angell  [facility] 10/25/2015, 2:01 PM

## 2015-10-25 NOTE — Care Management Obs Status (Signed)
MEDICARE OBSERVATION STATUS NOTIFICATION   Patient Details  Name: Karl Stevenson MRN: [id number] Date of Birth: 1926-12-07   Medicare Observation Status Notification Given:  Yes    MayoChapman Fitch, RN 10/25/2015, 10:15 AM

## 2015-10-28 ENCOUNTER — Encounter: Payer: Self-pay | Admitting: Cardiovascular Disease

## 2015-10-28 ENCOUNTER — Ambulatory Visit (INDEPENDENT_AMBULATORY_CARE_PROVIDER_SITE_OTHER): Payer: Medicare PPO | Admitting: Cardiovascular Disease

## 2015-10-28 VITALS — BP 160/80 | HR 68 | Ht 67.0 in | Wt 134.2 lb

## 2015-10-28 DIAGNOSIS — I5022 Chronic systolic (congestive) heart failure: Secondary | ICD-10-CM

## 2015-10-28 DIAGNOSIS — R55 Syncope and collapse: Secondary | ICD-10-CM | POA: Diagnosis not present

## 2015-10-28 NOTE — Patient Instructions (Signed)
**Note De-Identified  Obfuscation** Medication Instructions:  Same-no changes  Labwork: None  Testing/Procedures: Your physician has requested that you have an echocardiogram. Echocardiography is a painless test that uses sound waves to create images of your heart. It provides your doctor with information about the size and shape of your heart and how well your heart's chambers and valves are working. This procedure takes approximately one hour. There are no restrictions for this procedure.  Follow-Up: In 4 months with Norma Fredrickson, NP.     If you need a refill on your cardiac medications before your next appointment, please call your pharmacy.

## 2015-10-28 NOTE — Progress Notes (Signed)
Cardiology Office Note Date:  10/28/2015   ID:  Karl Stevenson, DOB 02/23/1927, MRN [id number]  PCP:  Garlan Fillers, MD  Cardiologist:  [name], MD    Chief Complaint  Patient presents with  . Coronary Artery Disease  . Hypertension   History of Present Illness: Karl Stevenson is a 82 y.o. male who presents for follow-up evaluation. This appointment was previously scheduled, but the patient was just hospitalized a few days ago with syncope. He had developed a GI illness with watery stool and decreased oral intake. He lost consciousness after eating breakfast and slumped over in his chair. He did not fall to the ground. He was hospitalized overnight in resuscitated with fluids. There were no arrhythmias seen on telemetry.  The patient has an extensive cardiac history. He underwent redo CABG in 2008 after initial CABG surgery in 1987. He's been treated for chronic systolic heart failure. He has a history of ventricular tachycardia and he is treated with mexiletine.  He's feeling better. Brings in home blood pressure readings with one elevated reading of 180/85. His other readings are all in range. His GI symptoms have now resolved. He's had no recent chest pain or pressure. He feels like he could get back out and play golf on one of his good days. He would want to have someone with him to help him. He denies edema, orthopnea, or PND.   Past Medical History  Diagnosis Date  . Hyperlipidemia   . Hypertension   . Carotid artery occlusion     a. s/p L CEA in 2010;  b. 06/2014 Carotid U/S: stable LICA CEA site w/o significant stenosis bilat.  . Crohn's disease (HCC)     a. s/p colon resection  . Bradycardia     a. previous intolerance to beta blockers - low dose toprol started 10/2011 for tachycardia but had rash and was discontinued;  b. tolerating low dose propranolol.  . Chronic systolic heart failure (HCC)     a. 02/2015 Echo:  35-40% (echo 11/2010); c. 02/2015 Echo: EF  20-25%, diff HK, mid-apicalanteroseptal and apical AK, Gr 1 DD, mild MR.  . Ischemic cardiomyopathy     a. EF 34% (Lexiscan 06/2011); b. 35-40% (echo 11/2010); c. 02/2015 Echo: EF 20-25%, diff HK, mid-apicalanteroseptal and apical AK, Gr 1 DD, mild MR.  . Coronary artery disease     a. 1969 s/p MI;  b. S/P CABG 1987;  c. S/P redo 2008;  d. 07/2012 Cath: stable->Med Rx, EF 40%; e. 02/2014 MV: anterior and posterolateral/apical infarct with mild peri-infarct ischemia->Med Rx; f. 02/2015 Cath: LM 100d, LAD 100ost, LCX 100ost, OM2 100, VG->OM3->OM4 nl, VG-> LAD nl (Y from VG->OM), VG->OM2 100p.  [name] Peripheral vascular disease (HCC)     a. Carotid dz s/p L CEA, RAS, AAA, LE dz  . AAA (abdominal aortic aneurysm) (HCC)     a. Ectatic abdominal aorta with fusiform dilitation 3.4x3.4 and moderate thrombus burden 12/2010  . RBBB   . Tachycardia     a. 10/2011 - atrial tach vs avnrt - BB started.  [name] COPD (chronic obstructive pulmonary disease) (HCC)   . Ventricular tachycardia (HCC)     a. 02/2015 ->amio 200 daily added-->later switched to mexilitene.  . Gout   . Renal artery stenosis (HCC)     a. Dopplers 12/2010 - 1-59% R ostial renal artery stenosis, 2 L renal arteries both widely patent  . Syncope     Past Surgical History  Procedure Laterality  Date  . Carotid endarterectomy Left 2010  . Colon resection  2008  . Tonsillectomy      as a child  . Pr vein bypass graft,aorto-fem-pop  10/1985  . Femoral endarterectomy Left  06/2011    ileofemoral endarterectomy with bovine patch angioplasty  . Endarterectomy  08/26/2011    Procedure: ENDARTERECTOMY ILIAC;  Surgeon: [name], MD;  Location: [facility] OR;  Service: Vascular;  Laterality: Right;  Right Femoral Artery Endarterectomy with vascu guard patch angioplasty & intraoperative arteriogram.  . Cardiac catheterization  07/28/2012    Native 3v CAD, continued patency of the SVG-OM1-LAD and SVG-OM2-left PDA. LVEF 40% with multiple WMAs  . Abdominal aortagram  N/A 06/11/2011    Procedure: ABDOMINAL [name];  Surgeon: [name], MD;  Location: [facility] CATH LAB;  Service: Cardiovascular;  Laterality: N/A;  . Left heart catheterization with coronary angiogram N/A 07/28/2012    Procedure: LEFT HEART CATHETERIZATION WITH CORONARY ANGIOGRAM;  Surgeon: [name], MD;  Location: [facility] CATH LAB;  Service: Cardiovascular;  Laterality: N/A;  . Colon surgery    . Cataract extraction, bilateral    . Coronary artery bypass graft  10/31/85 & 08/19/06  . Coronary angioplasty    . Cardiac catheterization N/A 03/08/2015    Procedure: Left Heart Cath and Coronary Angiography;  Surgeon: [name], MD;  Location: [facility] INVASIVE CV LAB;  Service: Cardiovascular;  Laterality: N/A;  . Eye surgery      Current Outpatient Prescriptions  Medication Sig Dispense Refill  . acetaminophen (TYLENOL) 500 MG tablet Take 1,000 mg by mouth every 6 (six) hours as needed for pain.    [name] aspirin 81 MG tablet Take 81 mg by mouth daily.     [name] atorvastatin (LIPITOR) 10 MG tablet Take 10 mg by mouth at bedtime.     . clopidogrel (PLAVIX) 75 MG tablet Take 75 mg by mouth daily.     [name] COLCRYS 0.6 MG tablet Take 0.6 mg by mouth daily as needed (gout).     [name] ipratropium (ATROVENT) 0.03 % nasal spray Place 1 spray into the nose daily.     . isosorbide mononitrate (IMDUR) 120 MG 24 hr tablet TAKE ONE TABLET BY MOUTH ONCE DAILY. 180 tablet 2  . isosorbide mononitrate (IMDUR) 60 MG 24 hr tablet Take 1 tablet (60 mg total) by mouth daily. In addition to isosorbide 120mg  tab daily 30 tablet 6  . losartan (COZAAR) 25 MG tablet Take 1 tablet (25 mg total) by mouth daily. 90 tablet 3  . Magnesium 400 MG TABS Take 400 mg by mouth 2 (two) times daily. 60 tablet 11  . mexiletine (MEXITIL) 200 MG capsule TAKE ONE CAPSULE BY MOUTH EVERY 12 HOURS 60 capsule 11  . Multiple Vitamins-Minerals (MULTIVITAMIN PO) Take 2 tablets by mouth daily.     . nitroGLYCERIN (NITROSTAT) 0.4 MG SL tablet Place 0.4 mg under  the tongue every 5 (five) minutes as needed for chest pain.    [name] OVER THE COUNTER MEDICATION Take 1 scoop by mouth daily. Barley life otc medication by aim.    . pantoprazole (PROTONIX) 40 MG tablet TAKE ONE TABLET BY MOUTH ONCE DAILY 90 tablet 2  . polyethylene glycol powder (GLYCOLAX/MIRALAX) powder Take 17 g by mouth daily.    . ranolazine (RANEXA) 500 MG 12 hr tablet Take 2 tablets (1,000 mg total) by mouth 2 (two) times daily. 120 tablet 6   No current facility-administered medications for this visit.    Allergies:  Novocain and Metoprolol   Social History:  The patient  reports that he has never smoked. He has never used smokeless tobacco. He reports that he does not drink alcohol or use illicit drugs.   Family History:  The patient's family history includes CAD in his brother; COPD in his brother and mother; Cancer in his mother; Deep vein thrombosis in his brother; Heart disease in his father. There is no history of Anesthesia problems.    ROS:  Please see the history of present illness.  All other systems are reviewed and negative.    PHYSICAL EXAM: VS:  BP 160/80 mmHg  Pulse 68  Ht  (1.702 m)  Wt 134 lb 3.2 oz (60.873 kg)  BMI 21.01 kg/m2 , BMI Body mass index is 21.01 kg/(m^2). GEN: Pleasant elderly male in no acute distress HEENT: normal Neck: no JVD, no masses. No carotid bruits Cardiac: RRR without murmur or gallop                Respiratory:  clear to auscultation bilaterally, normal work of breathing GI: soft, nontender, nondistended, + BS MS: no deformity or atrophy Ext: no pretibial edema Skin: warm and dry, no rash Neuro:  Strength and sensation are intact Psych: euthymic mood, full affect  EKG:  EKG is not ordered today.  Recent Labs: 09/02/2015: Magnesium 1.7 10/24/2015: ALT 17; TSH 2.053 10/25/2015: BUN 18; Creatinine, Ser 1.27*; Hemoglobin 10.9*; Platelets 127*; Potassium 3.8; Sodium 140   Lipid Panel     Component Value Date/Time   CHOL 99  03/06/2015 0341   TRIG 138 03/06/2015 0341   HDL 30* 03/06/2015 0341   CHOLHDL 3.3 03/06/2015 0341   VLDL 28 03/06/2015 0341   LDLCALC 41 03/06/2015 0341   Wt Readings from Last 3 Encounters:  10/28/15 134 lb 3.2 oz (60.873 kg)  10/25/15 128 lb 11.2 oz (58.378 kg)  08/15/15 132 lb 6.4 oz (60.056 kg)    Cardiac Studies Reviewed: Cath 03-08-2015: Conclusion     Sequential SVG-OM2-OM3 was injected is large, and is anatomically normal.  Y Graft SVG-LAD from SVG-OM is large, and is anatomically normal. The downstream LAD is small caliber and diffusely diseased.  LM lesion, 100% stenosed.  Ost LAD to Prox LAD lesion, 100% stenosed.  Ost 2nd Mrg to 2nd Mrg lesion, 100% stenosed.  SVG was injected is small. JR4 Catheter  Prox Original SVG-OM1 100% stenosed.  Ost Cx to Mid Cx lesion, 100% stenosed.  Borderline LOW LVEDP   Impression:  Known severe native coronary disease. Disabled contrast the native arteries were not evaluated.  Occluded old SVG-OM1 - the dye remained standing in the proximal vessel for several minutes following angiography - not PCI amenable  This is the likely culprit for the patient's episode of what sounds like anginal symptoms about a month ago. This may also be a cause of now worsening ventricular tachycardia.   Widely patent Y graft with one limb providing sequential grafting to OM1 and OM 2 as well as a separate limb serving as a graft to the LAD which retrograde fills the diagonal branch. This redo graft now provides the entirety of the patient's coronary circulation.  The graft obtuse marginal branches are relatively free of disease with retrograde filling back to the distal circumflex system which consists of the posterolateral branch and left PDA.  The grafted LAD itself was a very diffusely diseased vessel, but no change from previous.  Known small nondominant right coronary artery system.  Borderline low  LVEDP   Recommendations:  No  PCI targets.  Standard post radial cath care with TR band removal  Would proceed with ICD placement per EP    Echo 03-06-2015: Study Conclusions  - Left ventricle: The cavity size was normal. Systolic function was  severely reduced. The estimated ejection fraction was in the  range of 20% to 25%. Diffuse hypokinesis. There is akinesis of  the mid-apicalanteroseptal and apical myocardium. Doppler  parameters are consistent with abnormal left ventricular  relaxation (grade 1 diastolic dysfunction). - Mitral valve: Calcified annulus. There was mild regurgitation.  Impressions:  - When compared to prior echocardiogram, EF is reduced (prior 35%).  ASSESSMENT AND PLAN: 1.  Syncope: appears that this event was related to volume depletion. The patient had no documented arrhythmia by EMS or while on telemetry. He will continue current cardiac Rx's.   2. Chronic systolic heart failure, and Y Shea functional class II. The patient continues on losartan and isosorbide. He has not been able to tolerate beta blockers because of bradycardic events. Repeat echo prior to his office visit.  3. Paroxysmal ventricular tachycardia: Followed by Dr. Graciela Husbands. Treated with mexiletine and Ranexa.  4. Hypertension: Would tolerate higher blood pressures. He is pretty frail at this point. Continue current medicines.  5. Hyperlipidemia: Treated with atorvastatin.  6. Chronic kidney disease stage III: Recent labs reviewed and stable.  Current medicines are reviewed with the patient today.  The patient does not have concerns regarding medicines.  Labs/ tests ordered today include:  No orders of the defined types were placed in this encounter.    Disposition:   FU 4 months with Norma Fredrickson, echo prior to that visit  Signed, [name], MD  10/28/2015 3:25 PM    [facility] [address] [place], [place], [place]  [address] Phone: [phone]; Fax: [phone]

## 2015-11-14 DIAGNOSIS — I10 Essential (primary) hypertension: Secondary | ICD-10-CM | POA: Diagnosis not present

## 2015-11-14 DIAGNOSIS — R42 Dizziness and giddiness: Secondary | ICD-10-CM | POA: Diagnosis not present

## 2015-11-14 DIAGNOSIS — Z6821 Body mass index (BMI) 21.0-21.9, adult: Secondary | ICD-10-CM | POA: Diagnosis not present

## 2015-11-14 DIAGNOSIS — I2510 Atherosclerotic heart disease of native coronary artery without angina pectoris: Secondary | ICD-10-CM | POA: Diagnosis not present

## 2015-11-14 DIAGNOSIS — I7389 Other specified peripheral vascular diseases: Secondary | ICD-10-CM | POA: Diagnosis not present

## 2015-11-19 ENCOUNTER — Other Ambulatory Visit: Payer: Self-pay

## 2015-11-19 ENCOUNTER — Ambulatory Visit (HOSPITAL_COMMUNITY): Payer: Medicare PPO | Attending: Cardiology

## 2015-11-19 DIAGNOSIS — N183 Chronic kidney disease, stage 3 (moderate): Secondary | ICD-10-CM | POA: Diagnosis not present

## 2015-11-19 DIAGNOSIS — I255 Ischemic cardiomyopathy: Secondary | ICD-10-CM | POA: Diagnosis not present

## 2015-11-19 DIAGNOSIS — E785 Hyperlipidemia, unspecified: Secondary | ICD-10-CM | POA: Insufficient documentation

## 2015-11-19 DIAGNOSIS — R55 Syncope and collapse: Secondary | ICD-10-CM | POA: Diagnosis not present

## 2015-11-19 DIAGNOSIS — I5022 Chronic systolic (congestive) heart failure: Secondary | ICD-10-CM | POA: Insufficient documentation

## 2015-11-19 DIAGNOSIS — I130 Hypertensive heart and chronic kidney disease with heart failure and stage 1 through stage 4 chronic kidney disease, or unspecified chronic kidney disease: Secondary | ICD-10-CM | POA: Insufficient documentation

## 2015-11-19 DIAGNOSIS — I509 Heart failure, unspecified: Secondary | ICD-10-CM | POA: Diagnosis present

## 2015-11-19 DIAGNOSIS — I2510 Atherosclerotic heart disease of native coronary artery without angina pectoris: Secondary | ICD-10-CM | POA: Diagnosis not present

## 2015-12-13 ENCOUNTER — Other Ambulatory Visit: Payer: Self-pay | Admitting: Cardiovascular Disease

## 2016-01-13 ENCOUNTER — Other Ambulatory Visit: Payer: Self-pay | Admitting: Cardiovascular Disease

## 2016-01-19 ENCOUNTER — Other Ambulatory Visit: Payer: Self-pay | Admitting: Cardiovascular Disease

## 2016-02-17 ENCOUNTER — Encounter (HOSPITAL_COMMUNITY): Payer: Self-pay

## 2016-02-17 ENCOUNTER — Emergency Department (HOSPITAL_COMMUNITY): Payer: Medicare PPO

## 2016-02-17 ENCOUNTER — Emergency Department (HOSPITAL_COMMUNITY)
Admission: EM | Admit: 2016-02-17 | Discharge: 2016-02-17 | Disposition: A | Discharge status: Home / Self Care | Payer: Medicare PPO | Attending: Emergency Medicine | Admitting: Emergency Medicine

## 2016-02-17 DIAGNOSIS — W228XXA Striking against or struck by other objects, initial encounter: Secondary | ICD-10-CM | POA: Insufficient documentation

## 2016-02-17 DIAGNOSIS — R531 Weakness: Secondary | ICD-10-CM | POA: Diagnosis not present

## 2016-02-17 DIAGNOSIS — I2510 Atherosclerotic heart disease of native coronary artery without angina pectoris: Secondary | ICD-10-CM | POA: Diagnosis not present

## 2016-02-17 DIAGNOSIS — W19XXXA Unspecified fall, initial encounter: Secondary | ICD-10-CM

## 2016-02-17 DIAGNOSIS — R55 Syncope and collapse: Secondary | ICD-10-CM | POA: Diagnosis not present

## 2016-02-17 DIAGNOSIS — Y9289 Other specified places as the place of occurrence of the external cause: Secondary | ICD-10-CM | POA: Insufficient documentation

## 2016-02-17 DIAGNOSIS — Z951 Presence of aortocoronary bypass graft: Secondary | ICD-10-CM | POA: Insufficient documentation

## 2016-02-17 DIAGNOSIS — R404 Transient alteration of awareness: Secondary | ICD-10-CM | POA: Diagnosis not present

## 2016-02-17 DIAGNOSIS — S0101XA Laceration without foreign body of scalp, initial encounter: Secondary | ICD-10-CM

## 2016-02-17 DIAGNOSIS — S0990XA Unspecified injury of head, initial encounter: Secondary | ICD-10-CM | POA: Insufficient documentation

## 2016-02-17 DIAGNOSIS — Y939 Activity, unspecified: Secondary | ICD-10-CM | POA: Insufficient documentation

## 2016-02-17 DIAGNOSIS — I5022 Chronic systolic (congestive) heart failure: Secondary | ICD-10-CM | POA: Diagnosis not present

## 2016-02-17 DIAGNOSIS — Z23 Encounter for immunization: Secondary | ICD-10-CM | POA: Insufficient documentation

## 2016-02-17 DIAGNOSIS — J449 Chronic obstructive pulmonary disease, unspecified: Secondary | ICD-10-CM | POA: Diagnosis not present

## 2016-02-17 DIAGNOSIS — N183 Chronic kidney disease, stage 3 (moderate): Secondary | ICD-10-CM | POA: Diagnosis not present

## 2016-02-17 DIAGNOSIS — S0191XA Laceration without foreign body of unspecified part of head, initial encounter: Secondary | ICD-10-CM | POA: Diagnosis present

## 2016-02-17 DIAGNOSIS — Z7982 Long term (current) use of aspirin: Secondary | ICD-10-CM | POA: Insufficient documentation

## 2016-02-17 DIAGNOSIS — Y999 Unspecified external cause status: Secondary | ICD-10-CM | POA: Diagnosis not present

## 2016-02-17 DIAGNOSIS — I130 Hypertensive heart and chronic kidney disease with heart failure and stage 1 through stage 4 chronic kidney disease, or unspecified chronic kidney disease: Secondary | ICD-10-CM | POA: Insufficient documentation

## 2016-02-17 DIAGNOSIS — Z79899 Other long term (current) drug therapy: Secondary | ICD-10-CM | POA: Diagnosis not present

## 2016-02-17 LAB — URINALYSIS, ROUTINE W REFLEX MICROSCOPIC
BILIRUBIN URINE: NEGATIVE
Glucose, UA: NEGATIVE mg/dL
HGB URINE DIPSTICK: NEGATIVE
KETONES UR: NEGATIVE mg/dL
Leukocytes, UA: NEGATIVE
NITRITE: NEGATIVE
PROTEIN: NEGATIVE mg/dL
Specific Gravity, Urine: 1.02 (ref 1.005–1.030)
pH: 5.5 (ref 5.0–8.0)

## 2016-02-17 MED ORDER — LIDOCAINE-EPINEPHRINE 2 %-1:100000 IJ SOLN
10.0000 mL | Freq: Once | INTRAMUSCULAR | Status: AC
  Administered 2016-02-17: 10 mL via INTRADERMAL
  Filled 2016-02-17: qty 10

## 2016-02-17 MED ORDER — TETANUS-DIPHTH-ACELL PERTUSSIS 5-2.5-18.5 LF-MCG/0.5 IM SUSP
0.5000 mL | Freq: Once | INTRAMUSCULAR | Status: AC
  Administered 2016-02-17: 0.5 mL via INTRAMUSCULAR
  Filled 2016-02-17: qty 0.5

## 2016-02-17 NOTE — ED Notes (Signed)
Pt's head cleansed and wrapped pt tolerated well pt brought to lobby via wheelchair in no distress

## 2016-02-17 NOTE — ED Triage Notes (Signed)
Pt took a fall earlier today no LOC pt states that his knees simply gave out

## 2016-02-17 NOTE — Discharge Instructions (Signed)
Please read and follow all provided instructions.  Your diagnoses today include:  1. Laceration of scalp without foreign body, initial encounter   2. Fall    Tests performed today include: CT of the affected area that did not show any foreign bodies or broken bones Vital signs. See below for your results today.   Medications prescribed:   Take any prescribed medications only as directed.   Home care instructions:  Follow any educational materials and wound care instructions contained in this packet.   You may shower and wash the area with soap and water, just be sure to pat the area dry and not rub over the stitches. Do no put your stiches underwater (in a bath, pool, or lake). Getting stiches wet can slow down healing and increase your chances of getting an infection. You may apply Bacitracin or Neosporin twice a day for 7 days, and keep the ara clean with  bandage or gauze. Do not apply alcohol or hydrogen peroxide. Cover the area if it draining or weeping.   Follow-up instructions: Suture Removal: Return to the Emergency Department or see your primary care care doctor in 5-8 days for a recheck of your wound and removal of your sutures or staples.    Return instructions:  Return to the Emergency Department if you have: Fever Worsening pain Worsening swelling of the wound Pus draining from the wound Redness of the skin that moves away from the wound, especially if it streaks away from the affected area  Any other emergent concerns  Your vital signs today were: BP 148/83    Pulse 76    Temp 97.8 F (36.6 C) (Oral)    Resp 12    Ht 5\' 7"  (1.702 m)    Wt 59 kg    SpO2 100%    BMI 20.36 kg/m  If your blood pressure (BP) was elevated above 135/85 this visit, please have this repeated by your doctor within one month. --------------

## 2016-02-17 NOTE — ED Notes (Signed)
Cleaned patient's head with a combination of normal saline and hydrogen peroxide; visitors at bedside

## 2016-02-17 NOTE — ED Provider Notes (Signed)
[facility]-EMERGENCY DEPT Provider Note   CSN: [id number] Arrival date & time: 02/17/16  1327  History   Chief Complaint Chief Complaint  Patient presents with  . Fall    fall with a laceration noed to back of head bleeding is minimal at this point    HPI Karl Stevenson is a 82 y.o. male.  HPI  82 y.o. male with a hx of Chronic Systolic Heart Failure, COPD, CAD, Crohns Disease, HTN, HLD, PVD, presents to the Emergency Department today via EMS due to fall. Per patient, he had a mechanical fall when he was operating the fireplace in his states that he was crouching down and felt his legs give out. Notes hx of falls of similar in the past. Notes falling backwards and striking the posterior aspect of his head. No LOC. Notes bleeding initially. Pt recalls entire event. No N/V. No headache. No vision changes. No syncope. No CP/SOB/ABD pain. Pt able to ambulate without difficulty. Pt takes ASA and Plavix due to previous heart surgery. Denies pain currently. No other symptoms noted.    Past Medical History:  Diagnosis Date  . AAA (abdominal aortic aneurysm) (HCC)    a. Ectatic abdominal aorta with fusiform dilitation 3.4x3.4 and moderate thrombus burden 12/2010  . Bradycardia    a. previous intolerance to beta blockers - low dose toprol started 10/2011 for tachycardia but had rash and was discontinued;  b. tolerating low dose propranolol.  . Carotid artery occlusion    a. s/p L CEA in 2010;  b. 06/2014 Carotid U/S: stable LICA CEA site w/o significant stenosis bilat.  . Chronic systolic heart failure (HCC)    a. 02/2015 Echo:  35-40% (echo 11/2010); c. 02/2015 Echo: EF 20-25%, diff HK, mid-apicalanteroseptal and apical AK, Gr 1 DD, mild MR.  [name]. COPD (chronic obstructive pulmonary disease) (HCC)   . Coronary artery disease    a. 1969 s/p MI;  b. S/P CABG 1987;  c. S/P redo 2008;  d. 07/2012 Cath: stable->Med Rx, EF 40%; e. 02/2014 MV: anterior and posterolateral/apical infarct with mild peri-infarct  ischemia->Med Rx; f. 02/2015 Cath: LM 100d, LAD 100ost, LCX 100ost, OM2 100, VG->OM3->OM4 nl, VG-> LAD nl (Y from VG->OM), VG->OM2 100p.  . Crohn's disease (HCC)    a. s/p colon resection  . Gout   . Hyperlipidemia   . Hypertension   . Ischemic cardiomyopathy    a. EF 34% (Lexiscan 06/2011); b. 35-40% (echo 11/2010); c. 02/2015 Echo: EF 20-25%, diff HK, mid-apicalanteroseptal and apical AK, Gr 1 DD, mild MR.  . Peripheral vascular disease (HCC)    a. Carotid dz s/p L CEA, RAS, AAA, LE dz  . RBBB   . Renal artery stenosis (HCC)    a. Dopplers 12/2010 - 1-59% R ostial renal artery stenosis, 2 L renal arteries both widely patent  . Syncope   . Tachycardia    a. 10/2011 - atrial tach vs avnrt - BB started.  . Ventricular tachycardia (HCC)    a. 02/2015 ->amio 200 daily added-->later switched to mexilitene.    Patient Active Problem List   Diagnosis Date Noted  . Orthostatic hypotension 10/25/2015  . Dehydration 10/24/2015  . Nausea and vomiting 10/24/2015  . Diarrhea 10/24/2015  . HLD (hyperlipidemia) 08/15/2015  . Midsternal chest pain 07/28/2015  . Syncope 04/27/2015  . Ventricular tachycardia (HCC) 03/07/2015  . NSVT (nonsustained ventricular tachycardia) (HCC) 03/05/2015  . Unstable angina pectoris (HCC) 02/12/2014  . Abdominal pain, unspecified site 02/14/2013    Class:  Acute  . SBO (small bowel obstruction) 02/10/2013  . Ankle edema 09/02/2012  . Chronic venous insufficiency 09/02/2012  . Unstable angina (HCC) 07/28/2012  . CKD (chronic kidney disease), stage III 07/28/2012  . Atherosclerosis of native arteries of the extremities, unspecified 10/30/2011  . Tachycardia 10/18/2011  . Bradycardia 10/18/2011  . RBBB 10/17/2011  . Chest pain 10/17/2011  . Sinus tachycardia 10/17/2011  . Aftercare following surgery of the circulatory system, NEC 09/11/2011  . Bilateral femoral artery stenosis (HCC) 09/11/2011  . Hypotension 08/27/2011  . Peripheral vascular disease,  unspecified (HCC) 07/24/2011  . Ischemic cardiomyopathy 06/16/2011  . Chronic systolic heart failure (HCC)   . Occlusion and stenosis of carotid artery without mention of cerebral infarction 06/05/2011  . Pain, limb, left 06/05/2011  . Intermittent claudication (HCC) 06/05/2011  . Carotid stenosis 06/05/2011  . Atherosclerosis of renal artery (HCC) 01/06/2011  . CAD (coronary artery disease) 11/21/2010  . HTN (hypertension) 11/21/2010  . PVD (peripheral vascular disease) (HCC) 11/21/2010  . Crohn's disease (HCC)   . Anemia   . Anemia     Past Surgical History:  Procedure Laterality Date  . ABDOMINAL AORTAGRAM N/A 06/11/2011   Procedure: ABDOMINAL [name];  Surgeon: [name], MD;  Location: [facility] CATH LAB;  Service: Cardiovascular;  Laterality: N/A;  . CARDIAC CATHETERIZATION  07/28/2012   Native 3v CAD, continued patency of the SVG-OM1-LAD and SVG-OM2-left PDA. LVEF 40% with multiple WMAs  . CARDIAC CATHETERIZATION N/A 03/08/2015   Procedure: Left Heart Cath and Coronary Angiography;  Surgeon: [name], MD;  Location: [facility] INVASIVE CV LAB;  Service: Cardiovascular;  Laterality: N/A;  . CAROTID ENDARTERECTOMY Left 2010  . CATARACT EXTRACTION, BILATERAL    . COLON RESECTION  2008  . COLON SURGERY    . CORONARY ANGIOPLASTY    . CORONARY ARTERY BYPASS GRAFT  10/31/85 & 08/19/06  . ENDARTERECTOMY  08/26/2011   Procedure: ENDARTERECTOMY ILIAC;  Surgeon: [name], MD;  Location: [facility] OR;  Service: Vascular;  Laterality: Right;  Right Femoral Artery Endarterectomy with vascu guard patch angioplasty & intraoperative arteriogram.  . EYE SURGERY    . FEMORAL ENDARTERECTOMY Left  06/2011   ileofemoral endarterectomy with bovine patch angioplasty  . LEFT HEART CATHETERIZATION WITH CORONARY ANGIOGRAM N/A 07/28/2012   Procedure: LEFT HEART CATHETERIZATION WITH CORONARY ANGIOGRAM;  Surgeon: [name], MD;  Location: [facility] CATH LAB;  Service: Cardiovascular;  Laterality: N/A;  . PR VEIN  BYPASS GRAFT,AORTO-FEM-POP  10/1985  . TONSILLECTOMY     as a child       Home Medications    Prior to Admission medications   Medication Sig Start Date End Date Taking? Authorizing Provider  acetaminophen (TYLENOL) 500 MG tablet Take 1,000 mg by mouth every 6 (six) hours as needed for pain.    Historical Provider, MD  aspirin 81 MG tablet Take 81 mg by mouth daily.     Historical Provider, MD  atorvastatin (LIPITOR) 10 MG tablet Take 10 mg by mouth at bedtime.     Historical Provider, MD  clopidogrel (PLAVIX) 75 MG tablet TAKE ONE TABLET BY MOUTH ONCE DAILY 01/20/16   [name], MD  COLCRYS 0.6 MG tablet Take 0.6 mg by mouth daily as needed (gout).  04/17/11   Historical Provider, MD  ipratropium (ATROVENT) 0.03 % nasal spray Place 1 spray into the nose daily.     Historical Provider, MD  isosorbide mononitrate (IMDUR) 120 MG 24 hr tablet TAKE ONE TABLET BY MOUTH ONCE  DAILY. 03/04/15   [name], MD  isosorbide mononitrate (IMDUR) 60 MG 24 hr tablet Take 1 tablet (60 mg total) by mouth daily. In addition to isosorbide 120mg  tab daily 07/28/15   Ok Anis, NP  losartan (COZAAR) 25 MG tablet Take 1 tablet (25 mg total) by mouth daily. 08/15/15   Beatrice Lecher, PA-C  Magnesium 400 MG TABS Take 400 mg by mouth 2 (two) times daily. 08/16/15   Beatrice Lecher, PA-C  mexiletine (MEXITIL) 200 MG capsule TAKE ONE CAPSULE BY MOUTH EVERY 12 HOURS 09/03/15   Duke Salvia, MD  Multiple Vitamins-Minerals (MULTIVITAMIN PO) Take 2 tablets by mouth daily.     Historical Provider, MD  nitroGLYCERIN (NITROSTAT) 0.4 MG SL tablet Place 0.4 mg under the tongue every 5 (five) minutes as needed for chest pain.    Historical Provider, MD  OVER THE COUNTER MEDICATION Take 1 scoop by mouth daily. Barley life otc medication by aim.    Historical Provider, MD  pantoprazole (PROTONIX) 40 MG tablet TAKE ONE TABLET BY MOUTH ONCE DAILY 01/14/16   [name], MD  polyethylene glycol powder  (GLYCOLAX/MIRALAX) powder Take 17 g by mouth daily. 10/15/15   Historical Provider, MD  ranolazine (RANEXA) 500 MG 12 hr tablet Take 2 tablets (1,000 mg total) by mouth 2 (two) times daily. 01/14/16   [name], MD    Family History Family History  Problem Relation Age of Onset  . Heart disease Father   . Cancer Mother   . COPD Mother   . Deep vein thrombosis Brother   . COPD Brother   . CAD Brother   . Anesthesia problems Neg Hx     Social History Social History  Substance Use Topics  . Smoking status: Never Smoker  . Smokeless tobacco: Never Used  . Alcohol use No     Allergies   Novocain [procaine hcl] and Metoprolol   Review of Systems Review of Systems ROS reviewed and all are negative for acute change except as noted in the HPI.  Physical Exam Updated Vital Signs BP 130/83 (BP Location: Left Arm)   Pulse 99   Temp 97.8 F (36.6 C) (Oral)   Resp 16   Ht 5\' 7"  (1.702 m)   Wt 59 kg   SpO2 99%   BMI 20.36 kg/m   Physical Exam  Constitutional: He is oriented to person, place, and time. Vital signs are normal. He appears well-developed and well-nourished.  HENT:  Head: Normocephalic. Head is with laceration. Head is without raccoon's eyes and without Battle's sign.  Right Ear: Hearing normal.  Left Ear: Hearing normal.  2cm linear superficial laceration noted on posterior/superior aspect of scalp. Bleeding controlled.   Eyes: Conjunctivae and EOM are normal. Pupils are equal, round, and reactive to light.  Neck: Trachea normal, normal range of motion, full passive range of motion without pain and phonation normal. Neck supple. No spinous process tenderness and no muscular tenderness present. No erythema and normal range of motion present.  Cardiovascular: Normal rate, regular rhythm, normal heart sounds and intact distal pulses.   Pulmonary/Chest: Effort normal and breath sounds normal.  Neurological: He is alert and oriented to person, place, and time. He  has normal strength. No cranial nerve deficit or sensory deficit.  Cranial Nerves:  II: Pupils equal, round, reactive to light III,IV, VI: ptosis not present, extra-ocular motions intact bilaterally  V,VII: smile symmetric, facial light touch sensation equal VIII: hearing grossly normal bilaterally  IX,X:  midline uvula rise  XI: bilateral shoulder shrug equal and strong XII: midline tongue extension  Skin: Skin is warm and dry.  Psychiatric: He has a normal mood and affect. His speech is normal and behavior is normal. Thought content normal.  Nursing note and vitals reviewed.  ED Treatments / Results  Labs (all labs ordered are listed, but only abnormal results are displayed) Labs Reviewed  URINALYSIS, ROUTINE W REFLEX MICROSCOPIC (NOT AT [facility]) - Abnormal; Notable for the following:       Result Value   Color, Urine AMBER (*)    All other components within normal limits    EKG  EKG Interpretation None      Radiology Ct Head Wo Contrast  Result Date: 02/17/2016 CLINICAL DATA:  Head laceration after hitting table without loss of consciousness. EXAM: CT HEAD WITHOUT CONTRAST CT CERVICAL SPINE WITHOUT CONTRAST TECHNIQUE: Multidetector CT imaging of the head and cervical spine was performed following the standard protocol without intravenous contrast. Multiplanar CT image reconstructions of the cervical spine were also generated. COMPARISON:  CT scan of October 24, 2015. FINDINGS: CT HEAD FINDINGS Brain: Mild diffuse cerebral and cerebellar cortical atrophy is noted. Mild chronic ischemic white matter disease is noted. No mass effect or midline shift is noted. Ventricular size is within normal limits. There is no evidence of mass lesion, hemorrhage or acute infarction. Vascular: Atherosclerotic calcifications of internal carotid arteries is noted. Skull: Bony calvarium appears intact. Sinuses/Orbits: Visualized paranasal sinuses appear normal. Other: None. CT CERVICAL SPINE FINDINGS  Alignment: Normal. Skull base and vertebrae: No fracture is noted. Soft tissues and spinal canal: No abnormality seen. Disc levels: Moderate degenerative disc disease is noted at C4-5 and C5-6. Severe degenerative disc disease is noted at C6-7. Upper chest: Visualized lung apices are unremarkable. Other: Degenerative changes several left-sided posterior facet joints is noted. IMPRESSION: Mild diffuse cortical atrophy. Mild chronic ischemic white matter disease. No acute intracranial abnormality seen. Multilevel degenerative disc disease. No acute abnormality seen in the cervical spine. Electronically Signed   By: Lupita Raider, M.D.   On: 02/17/2016 15:58   Ct Cervical Spine Wo Contrast  Result Date: 02/17/2016 CLINICAL DATA:  Head laceration after hitting table without loss of consciousness. EXAM: CT HEAD WITHOUT CONTRAST CT CERVICAL SPINE WITHOUT CONTRAST TECHNIQUE: Multidetector CT imaging of the head and cervical spine was performed following the standard protocol without intravenous contrast. Multiplanar CT image reconstructions of the cervical spine were also generated. COMPARISON:  CT scan of October 24, 2015. FINDINGS: CT HEAD FINDINGS Brain: Mild diffuse cerebral and cerebellar cortical atrophy is noted. Mild chronic ischemic white matter disease is noted. No mass effect or midline shift is noted. Ventricular size is within normal limits. There is no evidence of mass lesion, hemorrhage or acute infarction. Vascular: Atherosclerotic calcifications of internal carotid arteries is noted. Skull: Bony calvarium appears intact. Sinuses/Orbits: Visualized paranasal sinuses appear normal. Other: None. CT CERVICAL SPINE FINDINGS Alignment: Normal. Skull base and vertebrae: No fracture is noted. Soft tissues and spinal canal: No abnormality seen. Disc levels: Moderate degenerative disc disease is noted at C4-5 and C5-6. Severe degenerative disc disease is noted at C6-7. Upper chest: Visualized lung apices are  unremarkable. Other: Degenerative changes several left-sided posterior facet joints is noted. IMPRESSION: Mild diffuse cortical atrophy. Mild chronic ischemic white matter disease. No acute intracranial abnormality seen. Multilevel degenerative disc disease. No acute abnormality seen in the cervical spine. Electronically Signed   By: Lupita Raider, M.D.  On: 02/17/2016 15:58    Procedures .Marland KitchenLaceration Repair Date/Time: 02/17/2016 4:49 PM Performed by: Audry Pili Authorized by: Audry Pili   Consent:    Consent obtained:  Verbal   Consent given by:  Patient   Risks discussed:  Infection and pain Anesthesia (see MAR for exact dosages):    Anesthesia method:  Local infiltration   Local anesthetic:  Lidocaine 1% WITH epi Laceration details:    Location:  Scalp   Scalp location:  Crown   Length (cm):  2 Pre-procedure details:    Preparation:  Patient was prepped and draped in usual sterile fashion and imaging obtained to evaluate for foreign bodies Exploration:    Hemostasis achieved with:  Direct pressure and epinephrine   Wound exploration: entire depth of wound probed and visualized   Treatment:    Area cleansed with:  Hibiclens   Amount of cleaning:  Standard   Irrigation solution:  Sterile saline   Irrigation method:  Pressure wash   Visualized foreign bodies/material removed: no   Skin repair:    Repair method:  Sutures   Suture size:  5-0   Suture material:  Prolene   Number of sutures:  5 Approximation:    Approximation:  Close   Vermilion border: well-aligned   Post-procedure details:    Dressing:  Antibiotic ointment   Patient tolerance of procedure:  Tolerated well, no immediate complications   (including critical care time)  Medications Ordered in ED Medications - No data to display   Initial Impression / Assessment and Plan / ED Course  I have reviewed the triage vital signs and the nursing notes.  Pertinent labs & imaging results that were available  during my care of the patient were reviewed by me and considered in my medical decision making (see chart for details).  Clinical Course   Final Clinical Impressions(s) / ED Diagnoses  I have reviewed and evaluated the relevant imaging studies.  I have reviewed the relevant previous healthcare records. I obtained HPI from historian. Patient discussed with supervising physician  ED Course:  Assessment: Pt is a 88yM with hx Chronic Systolic Heart Failure, COPD, CAD, Crohns Disease, HTN, HLD, PVD who presents with mechanical fall with lac on posterior scalp. Hx falls. No LOC. No CP/SOB. No headache. No vision changes. On exam, pt in NAD. Nontoxic/nonseptic appearing. VSS. Afebrile. Lungs CTA. Heart RRR. CN evaluated and unremarkable. 2cm linear superficial lac on posterior/superior scalp. Bleeding controlled. Pt on ASA and Plavix. CT Head/Neck unreamrkable. Repaired laceration with 5-0 prolene #5. Tdap shot given. Plan is to DC home with follow up to PCP. At time of discharge, Patient is in no acute distress. Vital Signs are stable. Patient is able to ambulate. Patient able to tolerate PO.    Disposition/Plan:  DC Home Additional Verbal discharge instructions given and discussed with patient.  Pt Instructed to f/u with PCP in the next week for evaluation and treatment of symptoms. Return precautions given Pt acknowledges and agrees with plan  Supervising Physician Loren Racer, MD   Final diagnoses:  Fall    New Prescriptions New Prescriptions   No medications on file     Audry Pili, PA-C 02/17/16 1715    Loren Racer, MD 02/22/16 1301

## 2016-02-17 NOTE — ED Notes (Signed)
Papers reviewed and pt. And family verbalize understanding of follow up instructions and suture care.

## 2016-02-19 ENCOUNTER — Encounter: Payer: Self-pay | Admitting: Nurse Practitioner

## 2016-02-22 DIAGNOSIS — Z23 Encounter for immunization: Secondary | ICD-10-CM | POA: Diagnosis not present

## 2016-02-24 ENCOUNTER — Other Ambulatory Visit: Payer: Self-pay | Admitting: Nurse Practitioner

## 2016-02-24 DIAGNOSIS — Z4802 Encounter for removal of sutures: Secondary | ICD-10-CM | POA: Diagnosis not present

## 2016-02-24 DIAGNOSIS — Z6822 Body mass index (BMI) 22.0-22.9, adult: Secondary | ICD-10-CM | POA: Diagnosis not present

## 2016-02-24 DIAGNOSIS — Z5189 Encounter for other specified aftercare: Secondary | ICD-10-CM | POA: Diagnosis not present

## 2016-03-03 ENCOUNTER — Ambulatory Visit (INDEPENDENT_AMBULATORY_CARE_PROVIDER_SITE_OTHER): Payer: Medicare PPO | Admitting: Nurse Practitioner

## 2016-03-03 ENCOUNTER — Encounter: Payer: Self-pay | Admitting: Nurse Practitioner

## 2016-03-03 VITALS — BP 110/64 | HR 83 | Ht 67.0 in | Wt 134.4 lb

## 2016-03-03 DIAGNOSIS — E7800 Pure hypercholesterolemia, unspecified: Secondary | ICD-10-CM

## 2016-03-03 DIAGNOSIS — I472 Ventricular tachycardia, unspecified: Secondary | ICD-10-CM

## 2016-03-03 LAB — CBC
HCT: 34.1 % — ABNORMAL LOW (ref 38.5–50.0)
Hemoglobin: 11.5 g/dL — ABNORMAL LOW (ref 13.2–17.1)
MCH: 35.2 pg — ABNORMAL HIGH (ref 27.0–33.0)
MCHC: 33.7 g/dL (ref 32.0–36.0)
MCV: 104.3 fL — ABNORMAL HIGH (ref 80.0–100.0)
MPV: 10.5 fL (ref 7.5–12.5)
Platelets: 170 10*3/uL (ref 140–400)
RBC: 3.27 MIL/uL — ABNORMAL LOW (ref 4.20–5.80)
RDW: 13 % (ref 11.0–15.0)
WBC: 7.2 10*3/uL (ref 3.8–10.8)

## 2016-03-03 NOTE — Progress Notes (Signed)
CARDIOLOGY OFFICE NOTE  Date:  03/03/2016    Karl Stevenson Date of Birth: [birth year] Medical Record #[id number]  PCP:  Garlan Fillers, MD  Cardiologist:  Kirt Boys   Chief Complaint  Patient presents with  . Coronary Artery Disease    Seen for Dr. Excell Seltzer    History of Present Illness: Karl Stevenson is a 82 y.o. male who presents today for a follow up visit. Seen for Dr. Verlon Au.   He has a hx of CAD status post CABG (original surgery in 1987, redo in 2008), ischemic cardiomyopathy, systolic CHF - last echo from July 2017 showed his EF had improved back to his baseline (35%), HTN, PAD, carotid stenosis status post L CEA, AAA, mild R renal artery stenosis, COPD.  Admitted in 11/16 with ventricular tachycardia. Patient and his family wanted to avoid device implantation.  Therefore, he was treated medically.  He was placed on amiodarone, mexiletine and Ranexa. Amiodarone was DC'd 2/2 bradycardia.    Admitted 3/17-3/19 with chest pain with questionable loss of consciousness. He had minimal elevation in troponin without clear trend. This was not felt to represent ACS. Last cardiac catheterization in 10/16 with stable anatomy and no target for PCI. Decision was made to continue medical therapy. His nitrate dose was increased.  Saw Tereso Newcomer, [place] back in April. Saw Dr. Excell Seltzer in June had just been discharged after a syncopal spell in the setting of GI illness.   Comes in today. Here alone. Says he is doing ok. Some days he feels a little wobbly - has to use a walker but most of the time can get around ok. No more syncopal spells. Fell earlier this month while messing with his gas logs - hit his head - had to have 5 stitches. BP readings from home are mostly ok. No chest pain. Breathing is ok. Turning 89 later this week. Asking about lab work. Apparently not on his Mag Ox anymore - not really clear why not.   Past Medical History:  Diagnosis Date  . AAA (abdominal  aortic aneurysm) (HCC)    a. Ectatic abdominal aorta with fusiform dilitation 3.4x3.4 and moderate thrombus burden 12/2010  . Bradycardia    a. previous intolerance to beta blockers - low dose toprol started 10/2011 for tachycardia but had rash and was discontinued;  b. tolerating low dose propranolol.  . Carotid artery occlusion    a. s/p L CEA in 2010;  b. 06/2014 Carotid U/S: stable LICA CEA site w/o significant stenosis bilat.  . Chronic systolic heart failure (HCC)    a. 02/2015 Echo:  35-40% (echo 11/2010); c. 02/2015 Echo: EF 20-25%, diff HK, mid-apicalanteroseptal and apical AK, Gr 1 DD, mild MR.  [name] COPD (chronic obstructive pulmonary disease) (HCC)   . Coronary artery disease    a. 1969 s/p MI;  b. S/P CABG 1987;  c. S/P redo 2008;  d. 07/2012 Cath: stable->Med Rx, EF 40%; e. 02/2014 MV: anterior and posterolateral/apical infarct with mild peri-infarct ischemia->Med Rx; f. 02/2015 Cath: LM 100d, LAD 100ost, LCX 100ost, OM2 100, VG->OM3->OM4 nl, VG-> LAD nl (Y from VG->OM), VG->OM2 100p.  . Crohn's disease (HCC)    a. s/p colon resection  . Gout   . Hyperlipidemia   . Hypertension   . Ischemic cardiomyopathy    a. EF 34% (Lexiscan 06/2011); b. 35-40% (echo 11/2010); c. 02/2015 Echo: EF 20-25%, diff HK, mid-apicalanteroseptal and apical AK, Gr 1 DD, mild MR.  . Peripheral  vascular disease (HCC)    a. Carotid dz s/p L CEA, RAS, AAA, LE dz  . RBBB   . Renal artery stenosis (HCC)    a. Dopplers 12/2010 - 1-59% R ostial renal artery stenosis, 2 L renal arteries both widely patent  . Syncope   . Tachycardia    a. 10/2011 - atrial tach vs avnrt - BB started.  . Ventricular tachycardia (HCC)    a. 02/2015 ->amio 200 daily added-->later switched to mexilitene.    Past Surgical History:  Procedure Laterality Date  . ABDOMINAL AORTAGRAM N/A 06/11/2011   Procedure: ABDOMINAL [name];  Surgeon: [name], MD;  Location: [facility] CATH LAB;  Service: Cardiovascular;  Laterality: N/A;  . CARDIAC  CATHETERIZATION  07/28/2012   Native 3v CAD, continued patency of the SVG-OM1-LAD and SVG-OM2-left PDA. LVEF 40% with multiple WMAs  . CARDIAC CATHETERIZATION N/A 03/08/2015   Procedure: Left Heart Cath and Coronary Angiography;  Surgeon: [name], MD;  Location: [facility] INVASIVE CV LAB;  Service: Cardiovascular;  Laterality: N/A;  . CAROTID ENDARTERECTOMY Left 2010  . CATARACT EXTRACTION, BILATERAL    . COLON RESECTION  2008  . COLON SURGERY    . CORONARY ANGIOPLASTY    . CORONARY ARTERY BYPASS GRAFT  10/31/85 & 08/19/06  . ENDARTERECTOMY  08/26/2011   Procedure: ENDARTERECTOMY ILIAC;  Surgeon: [name], MD;  Location: [facility] OR;  Service: Vascular;  Laterality: Right;  Right Femoral Artery Endarterectomy with vascu guard patch angioplasty & intraoperative arteriogram.  . EYE SURGERY    . FEMORAL ENDARTERECTOMY Left  06/2011   ileofemoral endarterectomy with bovine patch angioplasty  . LEFT HEART CATHETERIZATION WITH CORONARY ANGIOGRAM N/A 07/28/2012   Procedure: LEFT HEART CATHETERIZATION WITH CORONARY ANGIOGRAM;  Surgeon: [name], MD;  Location: [facility] CATH LAB;  Service: Cardiovascular;  Laterality: N/A;  . PR VEIN BYPASS GRAFT,AORTO-FEM-POP  10/1985  . TONSILLECTOMY     as a child     Medications: Current Outpatient Prescriptions  Medication Sig Dispense Refill  . acetaminophen (TYLENOL) 500 MG tablet Take 1,000 mg by mouth every 6 (six) hours as needed (pain).     [name] aspirin EC 81 MG tablet Take 81 mg by mouth daily.    [name] atorvastatin (LIPITOR) 10 MG tablet Take 10 mg by mouth at bedtime.     . carboxymethylcellulose (REFRESH TEARS) 0.5 % SOLN Place 1 drop into both eyes 4 (four) times daily.    . clopidogrel (PLAVIX) 75 MG tablet TAKE ONE TABLET BY MOUTH ONCE DAILY 90 tablet 3  . ipratropium (ATROVENT) 0.03 % nasal spray Place 1 spray into both nostrils daily.     . isosorbide mononitrate (IMDUR) 120 MG 24 hr tablet TAKE ONE TABLET BY MOUTH ONCE DAILY. (Patient taking differently:  TAKE ONE TABLET BY MOUTH ONCE DAILY. (TAKE WITH A 60 MG TABLET FOR A 180 MG DOSE)) 180 tablet 2  . isosorbide mononitrate (IMDUR) 60 MG 24 hr tablet TAKE ONE TABLET BY MOUTH ONCE DAILY IN ADDITION  TO ISOSORBIDE 120 MG TABLET DAILY 90 tablet 2  . losartan (COZAAR) 25 MG tablet Take 1 tablet (25 mg total) by mouth daily. 90 tablet 3  . Magnesium 400 MG TABS Take 400 mg by mouth 2 (two) times daily. 60 tablet 11  . mexiletine (MEXITIL) 200 MG capsule TAKE ONE CAPSULE BY MOUTH EVERY 12 HOURS 60 capsule 11  . Multiple Vitamin (MULTIVITAMIN WITH MINERALS) TABS tablet Take 2 tablets by mouth daily.    [name]  nitroGLYCERIN (NITROSTAT) 0.4 MG SL tablet Place 0.4 mg under the tongue every 5 (five) minutes as needed for chest pain.    [name] OVER THE COUNTER MEDICATION Take 1 scoop by mouth daily. Barley Life by Aim - OTC    . pantoprazole (PROTONIX) 40 MG tablet TAKE ONE TABLET BY MOUTH ONCE DAILY 90 tablet 3  . polyethylene glycol powder (GLYCOLAX/MIRALAX) powder Take 17 g by mouth at bedtime. Mix in 8 oz liquid and drink    . ranolazine (RANEXA) 500 MG 12 hr tablet Take 2 tablets (1,000 mg total) by mouth 2 (two) times daily. 120 tablet 11   No current facility-administered medications for this visit.     Allergies: Allergies  Allergen Reactions  . Novocain [Procaine Hcl] Other (See Comments)    Cold sweats and very bad headache.   . Metoprolol Hives, Itching and Rash    Social History: The patient  reports that he has never smoked. He has never used smokeless tobacco. He reports that he does not drink alcohol or use drugs.   Family History: The patient's family history includes CAD in his brother; COPD in his brother and mother; Cancer in his mother; Deep vein thrombosis in his brother; Heart disease in his father.   Review of Systems: Please see the history of present illness.   Otherwise, the review of systems is positive for none.   All other systems are reviewed and negative.   Physical  Exam: VS:  BP 110/64   Pulse 83   Ht 5\' 7"  (1.702 m)   Wt 134 lb 6.4 oz (61 kg)   SpO2 98% Comment: at rest  BMI 21.05 kg/m  .  BMI Body mass index is 21.05 kg/m.  Wt Readings from Last 3 Encounters:  03/03/16 134 lb 6.4 oz (61 kg)  02/17/16 130 lb (59 kg)  10/28/15 134 lb 3.2 oz (60.9 kg)    General: Pleasant. Elderly male who is alert and in no acute distress.   HEENT: Normal.  Neck: Supple, no JVD, carotid bruits, or masses noted.  Cardiac: Heart tones are distant. No edema.  Respiratory:  Lungs are clear to auscultation bilaterally with normal work of breathing.  GI: Soft and nontender.  MS: No deformity or atrophy. Gait and ROM intact.  Skin: Warm and dry. Color is normal.  Neuro:  Strength and sensation are intact and no gross focal deficits noted.  Psych: Alert, appropriate and with normal affect.   LABORATORY DATA:  EKG:  EKG is ordered today. This shows NSR with 1st degree AV block and bifascicular block.   Lab Results  Component Value Date   WBC 5.0 10/25/2015   HGB 10.9 (L) 10/25/2015   HCT 33.9 (L) 10/25/2015   PLT 127 (L) 10/25/2015   GLUCOSE 84 10/25/2015   CHOL 99 03/06/2015   TRIG 138 03/06/2015   HDL 30 (L) 03/06/2015   LDLCALC 41 03/06/2015   ALT 17 10/24/2015   AST 21 10/24/2015   NA 140 10/25/2015   K 3.8 10/25/2015   CL 111 10/25/2015   CREATININE 1.27 (H) 10/25/2015   BUN 18 10/25/2015   CO2 23 10/25/2015   TSH 2.053 10/24/2015   INR 1.20 07/27/2015    BNP (last 3 results) No results for input(s): BNP in the last 8760 hours.  ProBNP (last 3 results) No results for input(s): PROBNP in the last 8760 hours.   Other Studies Reviewed Today:  Echo Study Conclusions from 11/2015  - Left ventricle:  The cavity size was normal. Systolic function was   moderately to severely reduced. The estimated ejection fraction   was in the range of 30% to 35%. There is akinesis of the   apicalanterior, lateral, inferolateral, inferior, and apical    myocardium. There is akinesis of the midanteroseptal myocardium.   There was an increased relative contribution of atrial   contraction to ventricular filling. Doppler parameters are   consistent with abnormal left ventricular relaxation (grade 1   diastolic dysfunction). - Aortic valve: Trileaflet; normal thickness, mildly calcified   leaflets. - Mitral valve: Calcified annulus. There was mild regurgitation.    Cath 03-08-2015:    Conclusion     Sequential SVG-OM2-OM3 was injected is large, and is anatomically normal.  Y Graft SVG-LAD from SVG-OM is large, and is anatomically normal. The downstream LAD is small caliber and diffusely diseased.  LM lesion, 100% stenosed.  Ost LAD to Prox LAD lesion, 100% stenosed.  Ost 2nd Mrg to 2nd Mrg lesion, 100% stenosed.  SVG was injected is small. JR4 Catheter  Prox Original SVG-OM1 100% stenosed.  Ost Cx to Mid Cx lesion, 100% stenosed.  Borderline LOW LVEDP   Impression:  Known severe native coronary disease. Disabled contrast the native arteries were not evaluated.  Occluded old SVG-OM1 - the dye remained standing in the proximal vessel for several minutes following angiography - not PCI amenable ? This is the likely culprit for the patient's episode of what sounds like anginal symptoms about a month ago. This may also be a cause of now worsening ventricular tachycardia.   Widely patent Y graft with one limb providing sequential grafting to OM1 and OM 2 as well as a separate limb serving as a graft to the LAD which retrograde fills the diagonal branch. This redo graft now provides the entirety of the patient's coronary circulation.  The graft obtuse marginal branches are relatively free of disease with retrograde filling back to the distal circumflex system which consists of the posterolateral branch and left PDA.  The grafted LAD itself was a very diffusely diseased vessel, but no change from previous.  Known small  nondominant right coronary artery system.  Borderline low LVEDP   Recommendations:  No PCI targets.  Standard post radial cath care with TR band removal  Would proceed with ICD placement per EP    ASSESSMENT AND PLAN: 1.  CAD - managed medically.   2. Past syncope: last event was related to volume depletion. No recurrence  2. Chronic systolic heart failure - looks compensated.  He has not been able to tolerate beta blockers because of bradycardic events. Last echo showed that his EF had improved to his baseline at 35%. No change with current regimen fornow.   3. Paroxysmal ventricular tachycardia: Followed by Dr. Graciela Husbands. Treated with mexiletine and Ranexa. Has declined device implant. No longer on his magnesium - recheck today.   4. Hypertension: Would tolerate higher blood pressures. He is pretty frail at this point. Continue current medicines.  5. Hyperlipidemia: Treated with atorvastatin. Lab today  6. Chronic kidney disease stage III: lab today  7. Recent fall - he is getting more frail.    Current medicines are reviewed with the patient today.  The patient does not have concerns regarding medicines other than what has been noted above.  The following changes have been made:  See above.  Labs/ tests ordered today include:    Orders Placed This Encounter  Procedures  . Basic metabolic panel  .  CBC  . Magnesium  . Lipid panel  . Hepatic function panel     Disposition:   FU with   Patient is agreeable to this plan and will call if any problems develop in the interim.   Signed: Rosalio MacadamiaLori C. Rogue Rafalski, RN, ANP-C 03/03/2016 12:32 PM  [facility] [address] [place], [place]  [address] Phone: [phone] Fax: [phone]) [phone]

## 2016-03-03 NOTE — Patient Instructions (Addendum)
We will be checking the following labs today - BMET, Mag level, HPF, Lipids and CBC   Medication Instructions:    Continue with your current medicines.     Testing/Procedures To Be Arranged:  N/A  Follow-Up:   See me in 4 months    Other Special Instructions:   Stay safe!  Happy Birthday!    If you need a refill on your cardiac medications before your next appointment, please call your pharmacy.   Call the [facility] office at [phone] if you have any questions, problems or concerns.

## 2016-03-04 LAB — HEPATIC FUNCTION PANEL
ALT: 11 U/L (ref 9–46)
AST: 16 U/L (ref 10–35)
Albumin: 3.8 g/dL (ref 3.6–5.1)
Alkaline Phosphatase: 63 U/L (ref 40–115)
Bilirubin, Direct: 0.1 mg/dL (ref <=0.2)
Indirect Bilirubin: 0.4 mg/dL (ref 0.2–1.2)
Total Bilirubin: 0.5 mg/dL (ref 0.2–1.2)
Total Protein: 6.2 g/dL (ref 6.1–8.1)

## 2016-03-04 LAB — BASIC METABOLIC PANEL
BUN: 25 mg/dL (ref 7–25)
CO2: 24 mmol/L (ref 20–31)
Calcium: 8.9 mg/dL (ref 8.6–10.3)
Chloride: 103 mmol/L (ref 98–110)
Creat: 1.49 mg/dL — ABNORMAL HIGH (ref 0.70–1.11)
Glucose, Bld: 83 mg/dL (ref 65–99)
Potassium: 4.5 mmol/L (ref 3.5–5.3)
Sodium: 138 mmol/L (ref 135–146)

## 2016-03-04 LAB — LIPID PANEL
Cholesterol: 122 mg/dL — ABNORMAL LOW (ref 125–200)
HDL: 39 mg/dL — ABNORMAL LOW (ref >=40)
LDL Cholesterol: 48 mg/dL (ref <130)
Total CHOL/HDL Ratio: 3.1 Ratio (ref <=5.0)
Triglycerides: 174 mg/dL — ABNORMAL HIGH (ref <150)
VLDL: 35 mg/dL — ABNORMAL HIGH (ref <30)

## 2016-03-04 LAB — MAGNESIUM: Magnesium: 2 mg/dL (ref 1.5–2.5)

## 2016-03-07 ENCOUNTER — Other Ambulatory Visit: Payer: Self-pay | Admitting: Cardiovascular Disease

## 2016-03-09 ENCOUNTER — Other Ambulatory Visit: Payer: Self-pay | Admitting: Nurse Practitioner

## 2016-04-20 DIAGNOSIS — H903 Sensorineural hearing loss, bilateral: Secondary | ICD-10-CM | POA: Diagnosis not present

## 2016-04-27 DIAGNOSIS — H9211 Otorrhea, right ear: Secondary | ICD-10-CM | POA: Diagnosis not present

## 2016-04-27 DIAGNOSIS — H90A31 Mixed conductive and sensorineural hearing loss, unilateral, right ear with restricted hearing on the contralateral side: Secondary | ICD-10-CM | POA: Insufficient documentation

## 2016-04-27 DIAGNOSIS — H7291 Unspecified perforation of tympanic membrane, right ear: Secondary | ICD-10-CM | POA: Diagnosis not present

## 2016-06-04 DIAGNOSIS — Z125 Encounter for screening for malignant neoplasm of prostate: Secondary | ICD-10-CM | POA: Diagnosis not present

## 2016-06-04 DIAGNOSIS — M109 Gout, unspecified: Secondary | ICD-10-CM | POA: Diagnosis not present

## 2016-06-04 DIAGNOSIS — I10 Essential (primary) hypertension: Secondary | ICD-10-CM | POA: Diagnosis not present

## 2016-06-04 DIAGNOSIS — E784 Other hyperlipidemia: Secondary | ICD-10-CM | POA: Diagnosis not present

## 2016-06-04 DIAGNOSIS — R8299 Other abnormal findings in urine: Secondary | ICD-10-CM | POA: Diagnosis not present

## 2016-06-08 DIAGNOSIS — H90A22 Sensorineural hearing loss, unilateral, left ear, with restricted hearing on the contralateral side: Secondary | ICD-10-CM | POA: Diagnosis not present

## 2016-06-08 DIAGNOSIS — H7291 Unspecified perforation of tympanic membrane, right ear: Secondary | ICD-10-CM | POA: Diagnosis not present

## 2016-06-08 DIAGNOSIS — H90A31 Mixed conductive and sensorineural hearing loss, unilateral, right ear with restricted hearing on the contralateral side: Secondary | ICD-10-CM | POA: Diagnosis not present

## 2016-06-08 DIAGNOSIS — H7201 Central perforation of tympanic membrane, right ear: Secondary | ICD-10-CM | POA: Diagnosis not present

## 2016-06-12 DIAGNOSIS — I7389 Other specified peripheral vascular diseases: Secondary | ICD-10-CM | POA: Diagnosis not present

## 2016-06-12 DIAGNOSIS — I2510 Atherosclerotic heart disease of native coronary artery without angina pectoris: Secondary | ICD-10-CM | POA: Diagnosis not present

## 2016-06-12 DIAGNOSIS — I499 Cardiac arrhythmia, unspecified: Secondary | ICD-10-CM | POA: Diagnosis not present

## 2016-06-12 DIAGNOSIS — Z0000 Encounter for general adult medical examination without abnormal findings: Secondary | ICD-10-CM | POA: Diagnosis not present

## 2016-06-12 DIAGNOSIS — N183 Chronic kidney disease, stage 3 (moderate): Secondary | ICD-10-CM | POA: Diagnosis not present

## 2016-06-12 DIAGNOSIS — E784 Other hyperlipidemia: Secondary | ICD-10-CM | POA: Diagnosis not present

## 2016-06-12 DIAGNOSIS — J449 Chronic obstructive pulmonary disease, unspecified: Secondary | ICD-10-CM | POA: Diagnosis not present

## 2016-06-12 DIAGNOSIS — M109 Gout, unspecified: Secondary | ICD-10-CM | POA: Diagnosis not present

## 2016-06-12 DIAGNOSIS — R141 Gas pain: Secondary | ICD-10-CM | POA: Diagnosis not present

## 2016-06-16 NOTE — Progress Notes (Signed)
CARDIOLOGY OFFICE NOTE  Date:  06/17/2016    Karl Stevenson Date of Birth: 1927-04-06 Medical Record #[id number]  PCP:  Garlan Fillers, MD  Cardiologist:  [facility]    Chief Complaint  Patient presents with  . Coronary Artery Disease    Follow up visit - seen for Dr. Verlon Au    History of Present Illness: Karl Stevenson is a 83 y.o. male who presents today for a follow up visit. Seen for Dr. Verlon Au.   He has a hx of CAD status post CABG (original surgery in 1987, redo in 2008), ischemic cardiomyopathy, systolic CHF - last echo from July 2017 showed his EF had improved back to his baseline (35%), HTN, PAD, carotid stenosis status post L CEA, AAA, mild R renal artery stenosis, COPD. Admitted in 11/16 with ventricular tachycardia. Patient and his family wanted to avoid device implantation. Therefore, he was treated medically. He was placed on amiodarone, mexiletine and Ranexa. Amiodarone was DC'd 2/2 bradycardia.   Admitted March 2017 with chest pain with questionable loss of consciousness. He had minimal elevation in troponin without clear trend. This was not felt to represent ACS. Last cardiac catheterization in 10/16 with stable anatomy and no target for PCI. Decision was made to continue medical therapy. His nitrate dose was increased.  Saw Dr. Excell Seltzer in June of 2017 had just been discharged after a syncopal spell in the setting of GI illness.   I last saw him back in October - getting frail but cardiac status seemed ok. Had had a fall and needed stitches to his head.   Comes in today. Here alone. Now using a cane. Unsteady on his feet. He has stopped driving. He had some allergy/ear issue and has seen ENT. He says his heart is doing well. No more elevated HR's. No real angina. He has gotten more sedentary. No falls since last time here. BP ok for the most part. Recent labs noted and looks ok.   Past Medical History:  Diagnosis Date  . AAA  (abdominal aortic aneurysm) (HCC)    a. Ectatic abdominal aorta with fusiform dilitation 3.4x3.4 and moderate thrombus burden 12/2010  . Bradycardia    a. previous intolerance to beta blockers - low dose toprol started 10/2011 for tachycardia but had rash and was discontinued;  b. tolerating low dose propranolol.  . Carotid artery occlusion    a. s/p L CEA in 2010;  b. 06/2014 Carotid U/S: stable LICA CEA site w/o significant stenosis bilat.  . Chronic systolic heart failure (HCC)    a. 02/2015 Echo:  35-40% (echo 11/2010); c. 02/2015 Echo: EF 20-25%, diff HK, mid-apicalanteroseptal and apical AK, Gr 1 DD, mild MR.  [name] COPD (chronic obstructive pulmonary disease) (HCC)   . Coronary artery disease    a. 1969 s/p MI;  b. S/P CABG 1987;  c. S/P redo 2008;  d. 07/2012 Cath: stable->Med Rx, EF 40%; e. 02/2014 MV: anterior and posterolateral/apical infarct with mild peri-infarct ischemia->Med Rx; f. 02/2015 Cath: LM 100d, LAD 100ost, LCX 100ost, OM2 100, VG->OM3->OM4 nl, VG-> LAD nl (Y from VG->OM), VG->OM2 100p.  . Crohn's disease (HCC)    a. s/p colon resection  . Gout   . Hyperlipidemia   . Hypertension   . Ischemic cardiomyopathy    a. EF 34% (Lexiscan 06/2011); b. 35-40% (echo 11/2010); c. 02/2015 Echo: EF 20-25%, diff HK, mid-apicalanteroseptal and apical AK, Gr 1 DD, mild MR.  . Peripheral vascular disease (HCC)  a. Carotid dz s/p L CEA, RAS, AAA, LE dz  . RBBB   . Renal artery stenosis (HCC)    a. Dopplers 12/2010 - 1-59% R ostial renal artery stenosis, 2 L renal arteries both widely patent  . Syncope   . Tachycardia    a. 10/2011 - atrial tach vs avnrt - BB started.  . Ventricular tachycardia (HCC)    a. 02/2015 ->amio 200 daily added-->later switched to mexilitene.    Past Surgical History:  Procedure Laterality Date  . ABDOMINAL AORTAGRAM N/A 06/11/2011   Procedure: ABDOMINAL [name];  Surgeon: [name], MD;  Location: [facility] CATH LAB;  Service: Cardiovascular;  Laterality: N/A;  .  CARDIAC CATHETERIZATION  07/28/2012   Native 3v CAD, continued patency of the SVG-OM1-LAD and SVG-OM2-left PDA. LVEF 40% with multiple WMAs  . CARDIAC CATHETERIZATION N/A 03/08/2015   Procedure: Left Heart Cath and Coronary Angiography;  Surgeon: [name], MD;  Location: [facility] INVASIVE CV LAB;  Service: Cardiovascular;  Laterality: N/A;  . CAROTID ENDARTERECTOMY Left 2010  . CATARACT EXTRACTION, BILATERAL    . COLON RESECTION  2008  . COLON SURGERY    . CORONARY ANGIOPLASTY    . CORONARY ARTERY BYPASS GRAFT  10/31/85 & 08/19/06  . ENDARTERECTOMY  08/26/2011   Procedure: ENDARTERECTOMY ILIAC;  Surgeon: [name], MD;  Location[facility] OR;  Service: Vascular;  Laterality: Right;  Right Femoral Artery Endarterectomy with vascu guard patch angioplasty & intraoperative arteriogram.  . EYE SURGERY    . FEMORAL ENDARTERECTOMY Left  06/2011   ileofemoral endarterectomy with bovine patch angioplasty  . LEFT HEART CATHETERIZATION WITH CORONARY ANGIOGRAM N/A 07/28/2012   Procedure: LEFT HEART CATHETERIZATION WITH CORONARY ANGIOGRAM;  Surgeon: [name], MD;  Location: [facility] CATH LAB;  Service: Cardiovascular;  Laterality: N/A;  . PR VEIN BYPASS GRAFT,AORTO-FEM-POP  10/1985  . TONSILLECTOMY     as a child     Medications: Current Outpatient Prescriptions  Medication Sig Dispense Refill  . acetaminophen (TYLENOL) 500 MG tablet Take 1,000 mg by mouth every 6 (six) hours as needed (pain).     [name] aspirin EC 81 MG tablet Take 81 mg by mouth daily.    [name] atorvastatin (LIPITOR) 10 MG tablet Take 10 mg by mouth at bedtime.     . carboxymethylcellulose (REFRESH TEARS) 0.5 % SOLN Place 1 drop into both eyes 4 (four) times daily.    . clopidogrel (PLAVIX) 75 MG tablet TAKE ONE TABLET BY MOUTH ONCE DAILY 90 tablet 3  . ipratropium (ATROVENT) 0.03 % nasal spray Place 1 spray into both nostrils daily.     . isosorbide mononitrate (IMDUR) 120 MG 24 hr tablet TAKE ONE TABLET BY MOUTH ONCE DAILY 90 tablet 3  .  isosorbide mononitrate (IMDUR) 60 MG 24 hr tablet TAKE ONE TABLET BY MOUTH ONCE DAILY IN ADDITION  TO ISOSORBIDE 120 MG TABLET DAILY 90 tablet 2  . lactase (LACTAID) 3000 units tablet Take 1,000 Units by mouth daily with breakfast.    . losartan (COZAAR) 25 MG tablet Take 1 tablet (25 mg total) by mouth daily. 90 tablet 3  . mexiletine (MEXITIL) 200 MG capsule TAKE ONE CAPSULE BY MOUTH EVERY 12 HOURS 60 capsule 11  . Multiple Vitamin (MULTIVITAMIN WITH MINERALS) TABS tablet Take 2 tablets by mouth daily.    . nitroGLYCERIN (NITROSTAT) 0.4 MG SL tablet Place 0.4 mg under the tongue every 5 (five) minutes as needed for chest pain.    [name] OVER THE COUNTER  MEDICATION Take 1 scoop by mouth daily. Barley Life by Aim - OTC    . pantoprazole (PROTONIX) 40 MG tablet TAKE ONE TABLET BY MOUTH ONCE DAILY 90 tablet 3  . polyethylene glycol powder (GLYCOLAX/MIRALAX) powder Take 17 g by mouth at bedtime. Mix in 8 oz liquid and drink    . ranolazine (RANEXA) 500 MG 12 hr tablet Take 2 tablets (1,000 mg total) by mouth 2 (two) times daily. 120 tablet 11  . vitamin B-12 (CYANOCOBALAMIN) 1000 MCG tablet Take 2,000 mcg by mouth daily.     No current facility-administered medications for this visit.     Allergies: Allergies  Allergen Reactions  . Novocain [Procaine Hcl] Other (See Comments)    Cold sweats and very bad headache.   . Metoprolol Hives, Itching and Rash    Social History: The patient  reports that he has never smoked. He has never used smokeless tobacco. He reports that he does not drink alcohol or use drugs.   Family History: The patient's family history includes CAD in his brother; COPD in his brother and mother; Cancer in his mother; Deep vein thrombosis in his brother; Heart disease in his father.   Review of Systems: Please see the history of present illness.   Otherwise, the review of systems is positive for none.   All other systems are reviewed and negative.   Physical Exam: VS:  BP  110/70   Pulse 74   Ht 5\' 6"  (1.676 m)   Wt 134 lb 1.9 oz (60.8 kg)   SpO2 99% Comment: at rest  BMI 21.65 kg/m  .  BMI Body mass index is 21.65 kg/m.  Wt Readings from Last 3 Encounters:  06/17/16 134 lb 1.9 oz (60.8 kg)  03/03/16 134 lb 6.4 oz (61 kg)  02/17/16 130 lb (59 kg)    General: Pleasant. Elderly male who is alert and in no acute distress.  He is unsteady on his feet.  HEENT: Normal.  Neck: Supple, no JVD, carotid bruits, or masses noted.  Cardiac: Regular rate and rhythm. Soft murmur noted. No edema.  Respiratory:  Lungs are clear to auscultation bilaterally with normal work of breathing.  GI: Soft and nontender.  MS: No deformity or atrophy. Gait and ROM intact. He is using a cane.  Skin: Warm and dry. Color is normal.  Neuro:  Strength and sensation are intact and no gross focal deficits noted.  Psych: Alert, appropriate and with normal affect.   LABORATORY DATA:  EKG:  EKG is not ordered today.  Lab Results  Component Value Date   WBC 7.2 03/03/2016   HGB 11.5 (L) 03/03/2016   HCT 34.1 (L) 03/03/2016   PLT 170 03/03/2016   GLUCOSE 83 03/03/2016   CHOL 122 (L) 03/03/2016   TRIG 174 (H) 03/03/2016   HDL 39 (L) 03/03/2016   LDLCALC 48 03/03/2016   ALT 11 03/03/2016   AST 16 03/03/2016   NA 138 03/03/2016   K 4.5 03/03/2016   CL 103 03/03/2016   CREATININE 1.49 (H) 03/03/2016   BUN 25 03/03/2016   CO2 24 03/03/2016   TSH 2.053 10/24/2015   INR 1.20 07/27/2015    BNP (last 3 results) No results for input(s): BNP in the last 8760 hours.  ProBNP (last 3 results) No results for input(s): PROBNP in the last 8760 hours.   Other Studies Reviewed Today:  Echo Study Conclusions from 11/2015  - Left ventricle: The cavity size was normal. Systolic function was  moderately to severely reduced. The estimated ejection fraction was in the range of 30% to 35%. There is akinesis of the apicalanterior, lateral, inferolateral, inferior, and  apical myocardium. There is akinesis of the midanteroseptal myocardium. There was an increased relative contribution of atrial contraction to ventricular filling. Doppler parameters are consistent with abnormal left ventricular relaxation (grade 1 diastolic dysfunction). - Aortic valve: Trileaflet; normal thickness, mildly calcified leaflets. - Mitral valve: Calcified annulus. There was mild regurgitation.    Cath 03-08-2015:    Conclusion    Sequential SVG-OM2-OM3 was injected is large, and is anatomically normal.  Y Graft SVG-LAD from SVG-OM is large, and is anatomically normal. The downstream LAD is small caliber and diffusely diseased.  LM lesion, 100% stenosed.  Ost LAD to Prox LAD lesion, 100% stenosed.  Ost 2nd Mrg to 2nd Mrg lesion, 100% stenosed.  SVG was injected is small. JR4 Catheter  Prox Original SVG-OM1 100% stenosed.  Ost Cx to Mid Cx lesion, 100% stenosed.  Borderline LOW LVEDP   Impression:  Known severe native coronary disease. Disabled contrast the native arteries were not evaluated.  Occluded old SVG-OM1 - the dye remained standing in the proximal vessel for several minutes following angiography - not PCI amenable ? This is the likely culprit for the patient's episode of what sounds like anginal symptoms about a month ago. This may also be a cause of now worsening ventricular tachycardia.   Widely patent Y graft with one limb providing sequential grafting to OM1 and OM 2 as well as a separate limb serving as a graft to the LAD which retrograde fills the diagonal branch. This redo graft now provides the entirety of the patient's coronary circulation.  The graft obtuse marginal branches are relatively free of disease with retrograde filling back to the distal circumflex system which consists of the posterolateral branch and left PDA.  The grafted LAD itself was a very diffusely diseased vessel, but no change from  previous.  Known small nondominant right coronary artery system.  Borderline low LVEDP   Recommendations:  No PCI targets.  Standard post radial cath care with TR band removal  Would proceed with ICD placement per EP    ASSESSMENT AND PLAN: 1. CAD - managed medically. He is doing well clinically. I have left him on his current regimen.   2. Past syncope: last event was related to volume depletion. No recurrence  2. Chronic systolic heart failure - looks compensated.  He has not been able to tolerate beta blockers because of bradycardic events. Last echo showed that his EF had improved to his baseline at 35%. No change with current regimen for now. Would continue for now.   3. Paroxysmal ventricular tachycardia: Followed by Dr. Graciela Husbands. Treated with mexiletine and Ranexa. Has declined device implant. Recent labs from PCP noted. Potassium is good at 4.9.  4. Hypertension: Would tolerate higher blood pressures but BP ok today and looks ok with his prior readings from home. He does a good job of monitoring. He is pretty frail at this point. Continue current medicines.  5. Hyperlipidemia: Treated with atorvastatin. Labs by PCP recently noted.   6. Chronic kidney disease stage III: recent lab noted.   7. Advancing age - will continue with conservative management.    Current medicines are reviewed with the patient today.  The patient does not have concerns regarding medicines other than what has been noted above.  He was subsequently seen with Dr. Excell Seltzer - we both feel he is  doing well.   The following changes have been made:  See above.  Labs/ tests ordered today include:   No orders of the defined types were placed in this encounter.    Disposition:   FU with me in 3 months with EKG.   Patient is agreeable to this plan and will call if any problems develop in the interim.   SignedNorma Fredrickson, NP  06/17/2016 11:23 AM  [facility]  [facility] [address] [place], [place]  [address] Phone: [phone] Fax: [phone]

## 2016-06-17 ENCOUNTER — Ambulatory Visit (INDEPENDENT_AMBULATORY_CARE_PROVIDER_SITE_OTHER): Payer: Medicare PPO | Admitting: Nurse Practitioner

## 2016-06-17 ENCOUNTER — Encounter: Payer: Self-pay | Admitting: Nurse Practitioner

## 2016-06-17 VITALS — BP 110/70 | HR 74 | Ht 66.0 in | Wt 134.1 lb

## 2016-06-17 DIAGNOSIS — I472 Ventricular tachycardia, unspecified: Secondary | ICD-10-CM

## 2016-06-17 DIAGNOSIS — E7800 Pure hypercholesterolemia, unspecified: Secondary | ICD-10-CM | POA: Diagnosis not present

## 2016-06-17 DIAGNOSIS — I10 Essential (primary) hypertension: Secondary | ICD-10-CM | POA: Diagnosis not present

## 2016-06-17 DIAGNOSIS — I255 Ischemic cardiomyopathy: Secondary | ICD-10-CM | POA: Diagnosis not present

## 2016-06-17 DIAGNOSIS — I5022 Chronic systolic (congestive) heart failure: Secondary | ICD-10-CM | POA: Diagnosis not present

## 2016-06-17 NOTE — Patient Instructions (Addendum)
We will be checking the following labs today - NONE   Medication Instructions:    Continue with your current medicines.     Testing/Procedures To Be Arranged:  N/A  Follow-Up:   See me in about 3 months with EKG    Other Special Instructions:   N/A    If you need a refill on your cardiac medications before your next appointment, please call your pharmacy.   Call the [facility] office at [phone] if you have any questions, problems or concerns.

## 2016-07-01 ENCOUNTER — Encounter: Payer: Self-pay | Admitting: Family

## 2016-07-08 ENCOUNTER — Ambulatory Visit (INDEPENDENT_AMBULATORY_CARE_PROVIDER_SITE_OTHER): Payer: Medicare PPO | Admitting: Family

## 2016-07-08 ENCOUNTER — Ambulatory Visit (HOSPITAL_COMMUNITY)
Admission: RE | Admit: 2016-07-08 | Discharge: 2016-07-08 | Disposition: A | Discharge status: Home / Self Care | Payer: Medicare PPO | Source: Ambulatory Visit | Attending: Family | Admitting: Family

## 2016-07-08 ENCOUNTER — Encounter: Payer: Self-pay | Admitting: Family

## 2016-07-08 VITALS — BP 137/80 | HR 73 | Temp 96.7°F | Resp 18 | Ht 66.0 in | Wt 131.0 lb

## 2016-07-08 DIAGNOSIS — I6521 Occlusion and stenosis of right carotid artery: Secondary | ICD-10-CM | POA: Insufficient documentation

## 2016-07-08 DIAGNOSIS — Z9889 Other specified postprocedural states: Secondary | ICD-10-CM | POA: Diagnosis not present

## 2016-07-08 DIAGNOSIS — I779 Disorder of arteries and arterioles, unspecified: Secondary | ICD-10-CM

## 2016-07-08 DIAGNOSIS — Z48812 Encounter for surgical aftercare following surgery on the circulatory system: Secondary | ICD-10-CM | POA: Insufficient documentation

## 2016-07-08 DIAGNOSIS — I6523 Occlusion and stenosis of bilateral carotid arteries: Secondary | ICD-10-CM | POA: Diagnosis not present

## 2016-07-08 LAB — VAS US CAROTID
LCCADSYS: 68 cm/s
LCCAPDIAS: 24 cm/s
LEFT ECA DIAS: -9 cm/s
LEFT VERTEBRAL DIAS: -9 cm/s
LICADSYS: -75 cm/s
Left CCA dist dias: 16 cm/s
Left CCA prox sys: 101 cm/s
Left ICA dist dias: -26 cm/s
Left ICA prox dias: 18 cm/s
Left ICA prox sys: 75 cm/s
RCCADSYS: 54 cm/s
RCCAPDIAS: -15 cm/s
RCCAPSYS: -56 cm/s
RIGHT CCA MID DIAS: 18 cm/s
RIGHT ECA DIAS: -8 cm/s
RIGHT VERTEBRAL DIAS: 14 cm/s

## 2016-07-08 NOTE — Progress Notes (Signed)
Chief Complaint: Follow up Extracranial Carotid Artery Stenosis/PAOD   History of Present Illness  Karl Stevenson is a 83 y.o. male patient of Dr. Imogene Burn who is s/p bilateral iliofemoral endarterectomies with patch angioplasty (Date: R 08/26/11, L 07/01/11), L CEA by Dr. Madilyn Fireman (11/20/08). The prior R inguinal seroma has resolved. The patient's treatment regimen currently includes: maximal medical management. He returns today for follow up. Toes of both feet have tingling and pain, right is worse than left, started about 2012, gradually worsening; he does not have DM, denies any known back problems. Pt states his podiatrist told him he has neuropathy.  He had a CABG in 1987 with redo in 2008, veins removed from both legs for CABG.  He was hospitalized at [facility] in October 2016 with v-tach, is seeing Dr. Graciela Husbands and Dr. Excell Seltzer who increased his Ranexa dose to 1 gm bid and started pt on Mexitil.   Pt denies any history of stroke or TIA.  He remains active.  His legs feel weak sometimes; this has been since veins were harvested from his legs for his CABG; otherwise it seems he does not have claudication sx's with walking, denies non healing wounds. Pt states Dr. Graciela Husbands, electrophysiologist, told him that he has "VT" and that he needs to sit down when this occurs until it passes.   Pt Diabetic: No Pt smoker: non-smoker  Pt meds include: Statin :Yes Betablocker: Yes ASA: Yes Other anticoagulants/antiplatelets: Plavix    Past Medical History:  Diagnosis Date  . AAA (abdominal aortic aneurysm) (HCC)    a. Ectatic abdominal aorta with fusiform dilitation 3.4x3.4 and moderate thrombus burden 12/2010  . Bradycardia    a. previous intolerance to beta blockers - low dose toprol started 10/2011 for tachycardia but had rash and was discontinued;  b. tolerating low dose propranolol.  . Carotid artery occlusion    a. s/p L CEA in 2010;  b. 06/2014 Carotid U/S: stable LICA CEA site w/o significant  stenosis bilat.  . Chronic systolic heart failure (HCC)    a. 02/2015 Echo:  35-40% (echo 11/2010); c. 02/2015 Echo: EF 20-25%, diff HK, mid-apicalanteroseptal and apical AK, Gr 1 DD, mild MR.  [name] COPD (chronic obstructive pulmonary disease) (HCC)   . Coronary artery disease    a. 1969 s/p MI;  b. S/P CABG 1987;  c. S/P redo 2008;  d. 07/2012 Cath: stable->Med Rx, EF 40%; e. 02/2014 MV: anterior and posterolateral/apical infarct with mild peri-infarct ischemia->Med Rx; f. 02/2015 Cath: LM 100d, LAD 100ost, LCX 100ost, OM2 100, VG->OM3->OM4 nl, VG-> LAD nl (Y from VG->OM), VG->OM2 100p.  . Crohn's disease (HCC)    a. s/p colon resection  . Gout   . Hyperlipidemia   . Hypertension   . Ischemic cardiomyopathy    a. EF 34% (Lexiscan 06/2011); b. 35-40% (echo 11/2010); c. 02/2015 Echo: EF 20-25%, diff HK, mid-apicalanteroseptal and apical AK, Gr 1 DD, mild MR.  . Peripheral vascular disease (HCC)    a. Carotid dz s/p L CEA, RAS, AAA, LE dz  . RBBB   . Renal artery stenosis (HCC)    a. Dopplers 12/2010 - 1-59% R ostial renal artery stenosis, 2 L renal arteries both widely patent  . Syncope   . Tachycardia    a. 10/2011 - atrial tach vs avnrt - BB started.  . Ventricular tachycardia (HCC)    a. 02/2015 ->amio 200 daily added-->later switched to mexilitene.    Social History Social History  Substance Use Topics  .  Smoking status: Never Smoker  . Smokeless tobacco: Never Used  . Alcohol use No    Family History Family History  Problem Relation Age of Onset  . Heart disease Father   . Cancer Mother   . COPD Mother   . Deep vein thrombosis Brother   . COPD Brother   . CAD Brother   . Anesthesia problems Neg Hx     Surgical History Past Surgical History:  Procedure Laterality Date  . ABDOMINAL AORTAGRAM N/A 06/11/2011   Procedure: ABDOMINAL [name];  Surgeon: [name], MD;  Location: [facility] CATH LAB;  Service: Cardiovascular;  Laterality: N/A;  . CARDIAC CATHETERIZATION  07/28/2012    Native 3v CAD, continued patency of the SVG-OM1-LAD and SVG-OM2-left PDA. LVEF 40% with multiple WMAs  . CARDIAC CATHETERIZATION N/A 03/08/2015   Procedure: Left Heart Cath and Coronary Angiography;  Surgeon: [name], MD;  Location: [facility] INVASIVE CV LAB;  Service: Cardiovascular;  Laterality: N/A;  . CAROTID ENDARTERECTOMY Left 2010  . CATARACT EXTRACTION, BILATERAL    . COLON RESECTION  2008  . COLON SURGERY    . CORONARY ANGIOPLASTY    . CORONARY ARTERY BYPASS GRAFT  10/31/85 & 08/19/06  . ENDARTERECTOMY  08/26/2011   Procedure: ENDARTERECTOMY ILIAC;  Surgeon: [name], MD;  Location: [facility] OR;  Service: Vascular;  Laterality: Right;  Right Femoral Artery Endarterectomy with vascu guard patch angioplasty & intraoperative arteriogram.  . EYE SURGERY    . FEMORAL ENDARTERECTOMY Left  06/2011   ileofemoral endarterectomy with bovine patch angioplasty  . LEFT HEART CATHETERIZATION WITH CORONARY ANGIOGRAM N/A 07/28/2012   Procedure: LEFT HEART CATHETERIZATION WITH CORONARY ANGIOGRAM;  Surgeon: [name], MD;  Location: [facility] CATH LAB;  Service: Cardiovascular;  Laterality: N/A;  . PR VEIN BYPASS GRAFT,AORTO-FEM-POP  10/1985  . TONSILLECTOMY     as a child    Allergies  Allergen Reactions  . Novocain [Procaine Hcl] Other (See Comments)    Cold sweats and very bad headache.   . Procaine Other (See Comments)  . Metoprolol Hives, Itching and Rash    Current Outpatient Prescriptions  Medication Sig Dispense Refill  . acetaminophen (TYLENOL) 500 MG tablet Take 1,000 mg by mouth every 6 (six) hours as needed (pain).     [name] aspirin EC 81 MG tablet Take 81 mg by mouth daily.    [name] atorvastatin (LIPITOR) 10 MG tablet Take 10 mg by mouth at bedtime.     . carboxymethylcellulose (REFRESH TEARS) 0.5 % SOLN Place 1 drop into both eyes 4 (four) times daily.    . clopidogrel (PLAVIX) 75 MG tablet TAKE ONE TABLET BY MOUTH ONCE DAILY 90 tablet 3  . ipratropium (ATROVENT) 0.03 % nasal spray Place 1  spray into both nostrils daily.     . isosorbide mononitrate (IMDUR) 120 MG 24 hr tablet TAKE ONE TABLET BY MOUTH ONCE DAILY 90 tablet 3  . isosorbide mononitrate (IMDUR) 60 MG 24 hr tablet TAKE ONE TABLET BY MOUTH ONCE DAILY IN ADDITION  TO ISOSORBIDE 120 MG TABLET DAILY 90 tablet 2  . lactase (LACTAID) 3000 units tablet Take 1,000 Units by mouth daily with breakfast.    . losartan (COZAAR) 25 MG tablet Take 1 tablet (25 mg total) by mouth daily. 90 tablet 3  . mexiletine (MEXITIL) 200 MG capsule TAKE ONE CAPSULE BY MOUTH EVERY 12 HOURS 60 capsule 11  . Multiple Vitamin (MULTIVITAMIN WITH MINERALS) TABS tablet Take 2 tablets by mouth daily.    [name]  nitroGLYCERIN (NITROSTAT) 0.4 MG SL tablet Place 0.4 mg under the tongue every 5 (five) minutes as needed for chest pain.    [name] OVER THE COUNTER MEDICATION Take 1 scoop by mouth daily. Barley Life by Aim - OTC    . pantoprazole (PROTONIX) 40 MG tablet TAKE ONE TABLET BY MOUTH ONCE DAILY 90 tablet 3  . polyethylene glycol powder (GLYCOLAX/MIRALAX) powder Take 17 g by mouth at bedtime. Mix in 8 oz liquid and drink    . ranolazine (RANEXA) 500 MG 12 hr tablet Take 2 tablets (1,000 mg total) by mouth 2 (two) times daily. 120 tablet 11  . vitamin B-12 (CYANOCOBALAMIN) 1000 MCG tablet Take 2,000 mcg by mouth daily.     No current facility-administered medications for this visit.     Review of Systems : See HPI for pertinent positives and negatives.  Physical Examination  Vitals:   07/08/16 1055 07/08/16 1057  BP: 130/75 137/80  Pulse: 74 73  Resp: 18   Temp: (!) 96.7 F (35.9 C)   SpO2: 99%   Weight: 131 lb (59.4 kg)   Height: 5\' 6"  (1.676 m)    Body mass index is 21.14 kg/m.  General: A&O x 3, elderly, walking with a cane.  Pulmonary: Sym exp, respirations are non labored, good air movt, CTAB, no rales, rhonchi, or wheezing   Cardiac: RRR, Nl S1, S2, no detected murmur  Vascular:  Vessel  Right  Left   Radial  2+Palpable   1+Palpable   Carotid  Palpable, without bruit  Palpable, without bruit   Aorta  Non-palpable  N/A   Femoral  2+Palpable  2+Palpable   Popliteal  2+palpable  2+ palpable   PT  2+Palpable  2+Palpable   DP  1+Palpable  1+Palpable    Musculoskeletal: M/S 4/5 throughout , Extremities without ischemic changes, right ankle pitting edema at 2+, bilateral trace pretibial pitting edema  Neurologic: Pain and light touch intact in extremities , Motor exam as listed above. CN 2-12 intact except is hard of hearing.       Assessment: Karl Stevenson is a 83 y.o. male  who is s/p bilateral iliofemoral endarterectomies with patch angioplasty (Date: R 08/26/11, L 07/01/11), L CEA by Dr. Madilyn Fireman (11/20/08).   His legs feel weak sometimes but ABI's and TBI's in February 2016 remained normal with all triphasic waveforms compared th the previous year. He has no hx of stroke or TIA.  His atherosclerotic risk factors include significant CAD with a a redo of a CABG. Fortunately he has no DM and has never used tobacco.    DATA Today's carotid duplex suggests minimal right ICA stenosis and no restenosis of left CEA site.  Bilateral vertebral artery flow is antegrade.  Bilateral subclavian artery waveforms are normal.  No significant change compared to previous exams on 06/29/14 and 07-05-15.   Plan:  Follow-up in 18 months  with Carotid Duplex scan and ABI's.   I discussed in depth with the patient the nature of atherosclerosis, and emphasized the importance of maximal medical management including strict control of blood pressure, blood glucose, and lipid levels, obtaining regular exercise, and continued cessation of smoking.  The patient is aware that without maximal medical management the underlying atherosclerotic disease process will progress, limiting the benefit of any interventions. The patient was given information about stroke prevention and what symptoms should  prompt the patient to seek immediate medical care. Thank you for allowing [place] to participate in this patient's care.  Charisse March, RN, MSN, FNP-C [facility] Office: [phone]  Clinic Physician: Darrick Penna  07/08/16 11:03 AM

## 2016-07-08 NOTE — Patient Instructions (Addendum)
Stroke Prevention Some medical conditions and behaviors are associated with an increased chance of having a stroke. You may prevent a stroke by making healthy choices and managing medical conditions. How can I reduce my risk of having a stroke?  Stay physically active. Get at least 30 minutes of activity on most or all days.  Do not smoke. It may also be helpful to avoid exposure to secondhand smoke.  Limit alcohol use. Moderate alcohol use is considered to be:  No more than 2 drinks per day for men.  No more than 1 drink per day for nonpregnant women.  Eat healthy foods. This involves:  Eating 5 or more servings of fruits and vegetables a day.  Making dietary changes that address high blood pressure (hypertension), high cholesterol, diabetes, or obesity.  Manage your cholesterol levels.  Making food choices that are high in fiber and low in saturated fat, trans fat, and cholesterol may control cholesterol levels.  Take any prescribed medicines to control cholesterol as directed by your health care provider.  Manage your diabetes.  Controlling your carbohydrate and sugar intake is recommended to manage diabetes.  Take any prescribed medicines to control diabetes as directed by your health care provider.  Control your hypertension.  Making food choices that are low in salt (sodium), saturated fat, trans fat, and cholesterol is recommended to manage hypertension.  Ask your health care provider if you need treatment to lower your blood pressure. Take any prescribed medicines to control hypertension as directed by your health care provider.  If you are 18-39 years of age, have your blood pressure checked every 3-5 years. If you are 40 years of age or older, have your blood pressure checked every year.  Maintain a healthy weight.  Reducing calorie intake and making food choices that are low in sodium, saturated fat, trans fat, and cholesterol are recommended to manage  weight.  Stop drug abuse.  Avoid taking birth control pills.  Talk to your health care provider about the risks of taking birth control pills if you are over 35 years old, smoke, get migraines, or have ever had a blood clot.  Get evaluated for sleep disorders (sleep apnea).  Talk to your health care provider about getting a sleep evaluation if you snore a lot or have excessive sleepiness.  Take medicines only as directed by your health care provider.  For some people, aspirin or blood thinners (anticoagulants) are helpful in reducing the risk of forming abnormal blood clots that can lead to stroke. If you have the irregular heart rhythm of atrial fibrillation, you should be on a blood thinner unless there is a good reason you cannot take them.  Understand all your medicine instructions.  Make sure that other conditions (such as anemia or atherosclerosis) are addressed. Get help right away if:  You have sudden weakness or numbness of the face, arm, or leg, especially on one side of the body.  Your face or eyelid droops to one side.  You have sudden confusion.  You have trouble speaking (aphasia) or understanding.  You have sudden trouble seeing in one or both eyes.  You have sudden trouble walking.  You have dizziness.  You have a loss of balance or coordination.  You have a sudden, severe headache with no known cause.  You have new chest pain or an irregular heartbeat. Any of these symptoms may represent a serious problem that is an emergency. Do not wait to see if the symptoms will go away.   Get medical help at once. Call your local emergency services (911 in [place] Do not drive yourself to the hospital.  This information is not intended to replace advice given to you by your health care provider. Make sure you discuss any questions you have with your health care provider. Document Released: 06/04/2004 Document Revised: 10/03/2015 Document Reviewed: 10/28/2012 Elsevier  Interactive Patient Education  2017 Elsevier Inc.      Peripheral Vascular Disease Peripheral vascular disease (PVD) is a disease of the blood vessels that are not part of your heart and brain. A simple term for PVD is poor circulation. In most cases, PVD narrows the blood vessels that carry blood from your heart to the rest of your body. This can result in a decreased supply of blood to your arms, legs, and internal organs, like your stomach or kidneys. However, it most often affects a person's lower legs and feet. There are two types of PVD.  Organic PVD. This is the more common type. It is caused by damage to the structure of blood vessels.  Functional PVD. This is caused by conditions that make blood vessels contract and tighten (spasm). Without treatment, PVD tends to get worse over time. PVD can also lead to acute ischemic limb. This is when an arm or limb suddenly has trouble getting enough blood. This is a medical emergency. Follow these instructions at home:  Take medicines only as told by your doctor.  Do not use any tobacco products, including cigarettes, chewing tobacco, or electronic cigarettes. If you need help quitting, ask your doctor.  Lose weight if you are overweight, and maintain a healthy weight as told by your doctor.  Eat a diet that is low in fat and cholesterol. If you need help, ask your doctor.  Exercise regularly. Ask your doctor for some good activities for you.  Take good care of your feet.  Wear comfortable shoes that fit well.  Check your feet often for any cuts or sores. Contact a doctor if:  You have cramps in your legs while walking.  You have leg pain when you are at rest.  You have coldness in a leg or foot.  Your skin changes.  You are unable to get or have an erection (erectile dysfunction).  You have cuts or sores on your feet that are not healing. Get help right away if:  Your arm or leg turns cold and blue.  Your arms or legs  become red, warm, swollen, painful, or numb.  You have chest pain or trouble breathing.  You suddenly have weakness in your face, arm, or leg.  You become very confused or you cannot speak.  You suddenly have a very bad headache.  You suddenly cannot see. This information is not intended to replace advice given to you by your health care provider. Make sure you discuss any questions you have with your health care provider. Document Released: 07/22/2009 Document Revised: 10/03/2015 Document Reviewed: 10/05/2013 Elsevier Interactive Patient Education  2017 Elsevier Inc.  

## 2016-07-17 NOTE — Addendum Note (Signed)
Addended by: Burton Apley A on: 07/17/2016 08:58 AM   Modules accepted: Orders

## 2016-07-21 DIAGNOSIS — L570 Actinic keratosis: Secondary | ICD-10-CM | POA: Diagnosis not present

## 2016-07-21 DIAGNOSIS — L821 Other seborrheic keratosis: Secondary | ICD-10-CM | POA: Diagnosis not present

## 2016-07-21 DIAGNOSIS — C44519 Basal cell carcinoma of skin of other part of trunk: Secondary | ICD-10-CM | POA: Diagnosis not present

## 2016-07-21 DIAGNOSIS — Z1283 Encounter for screening for malignant neoplasm of skin: Secondary | ICD-10-CM | POA: Diagnosis not present

## 2016-07-21 DIAGNOSIS — X32XXXD Exposure to sunlight, subsequent encounter: Secondary | ICD-10-CM | POA: Diagnosis not present

## 2016-08-04 ENCOUNTER — Other Ambulatory Visit: Payer: Self-pay | Admitting: Physician Assistant

## 2016-08-11 ENCOUNTER — Other Ambulatory Visit: Payer: Self-pay | Admitting: Internal Medicine

## 2016-08-19 ENCOUNTER — Emergency Department (HOSPITAL_COMMUNITY): Payer: Medicare PPO

## 2016-08-19 ENCOUNTER — Observation Stay (HOSPITAL_COMMUNITY)
Admission: EM | Admit: 2016-08-19 | Discharge: 2016-08-20 | Disposition: A | Discharge status: Home / Self Care | Payer: Medicare PPO | Attending: Cardiovascular Disease | Admitting: Cardiovascular Disease

## 2016-08-19 ENCOUNTER — Encounter (HOSPITAL_COMMUNITY): Payer: Self-pay | Admitting: Emergency Medicine

## 2016-08-19 DIAGNOSIS — Z95820 Peripheral vascular angioplasty status with implants and grafts: Secondary | ICD-10-CM | POA: Diagnosis not present

## 2016-08-19 DIAGNOSIS — I440 Atrioventricular block, first degree: Secondary | ICD-10-CM | POA: Diagnosis not present

## 2016-08-19 DIAGNOSIS — I5022 Chronic systolic (congestive) heart failure: Secondary | ICD-10-CM | POA: Diagnosis not present

## 2016-08-19 DIAGNOSIS — I472 Ventricular tachycardia: Secondary | ICD-10-CM | POA: Diagnosis not present

## 2016-08-19 DIAGNOSIS — I452 Bifascicular block: Secondary | ICD-10-CM | POA: Insufficient documentation

## 2016-08-19 DIAGNOSIS — Z7982 Long term (current) use of aspirin: Secondary | ICD-10-CM | POA: Diagnosis not present

## 2016-08-19 DIAGNOSIS — N183 Chronic kidney disease, stage 3 unspecified: Secondary | ICD-10-CM | POA: Diagnosis present

## 2016-08-19 DIAGNOSIS — I255 Ischemic cardiomyopathy: Secondary | ICD-10-CM | POA: Diagnosis present

## 2016-08-19 DIAGNOSIS — I739 Peripheral vascular disease, unspecified: Secondary | ICD-10-CM | POA: Diagnosis not present

## 2016-08-19 DIAGNOSIS — Z951 Presence of aortocoronary bypass graft: Secondary | ICD-10-CM | POA: Diagnosis not present

## 2016-08-19 DIAGNOSIS — R0789 Other chest pain: Secondary | ICD-10-CM | POA: Diagnosis not present

## 2016-08-19 DIAGNOSIS — Z79899 Other long term (current) drug therapy: Secondary | ICD-10-CM | POA: Diagnosis not present

## 2016-08-19 DIAGNOSIS — I714 Abdominal aortic aneurysm, without rupture: Secondary | ICD-10-CM | POA: Diagnosis not present

## 2016-08-19 DIAGNOSIS — I10 Essential (primary) hypertension: Secondary | ICD-10-CM | POA: Diagnosis present

## 2016-08-19 DIAGNOSIS — I129 Hypertensive chronic kidney disease with stage 1 through stage 4 chronic kidney disease, or unspecified chronic kidney disease: Secondary | ICD-10-CM | POA: Insufficient documentation

## 2016-08-19 DIAGNOSIS — I4510 Unspecified right bundle-branch block: Secondary | ICD-10-CM | POA: Diagnosis present

## 2016-08-19 DIAGNOSIS — R079 Chest pain, unspecified: Principal | ICD-10-CM | POA: Diagnosis present

## 2016-08-19 DIAGNOSIS — Z7902 Long term (current) use of antithrombotics/antiplatelets: Secondary | ICD-10-CM | POA: Insufficient documentation

## 2016-08-19 DIAGNOSIS — J449 Chronic obstructive pulmonary disease, unspecified: Secondary | ICD-10-CM | POA: Insufficient documentation

## 2016-08-19 DIAGNOSIS — I25110 Atherosclerotic heart disease of native coronary artery with unstable angina pectoris: Secondary | ICD-10-CM | POA: Diagnosis not present

## 2016-08-19 LAB — BASIC METABOLIC PANEL
ANION GAP: 9 (ref 5–15)
BUN: 25 mg/dL — ABNORMAL HIGH (ref 6–20)
CHLORIDE: 105 mmol/L (ref 101–111)
CO2: 23 mmol/L (ref 22–32)
Calcium: 8.8 mg/dL — ABNORMAL LOW (ref 8.9–10.3)
Creatinine, Ser: 1.47 mg/dL — ABNORMAL HIGH (ref 0.61–1.24)
GFR calc non Af Amer: 41 mL/min — ABNORMAL LOW (ref >60)
GFR, EST AFRICAN AMERICAN: 48 mL/min — AB (ref >60)
GLUCOSE: 116 mg/dL — AB (ref 65–99)
Potassium: 4.3 mmol/L (ref 3.5–5.1)
Sodium: 137 mmol/L (ref 135–145)

## 2016-08-19 LAB — CBC
HCT: 35.4 % — ABNORMAL LOW (ref 39.0–52.0)
HEMATOCRIT: 35.2 % — AB (ref 39.0–52.0)
HEMOGLOBIN: 11.9 g/dL — AB (ref 13.0–17.0)
Hemoglobin: 11.7 g/dL — ABNORMAL LOW (ref 13.0–17.0)
MCH: 35.1 pg — AB (ref 26.0–34.0)
MCH: 34.4 pg — ABNORMAL HIGH (ref 26.0–34.0)
MCHC: 33.8 g/dL (ref 30.0–36.0)
MCHC: 33.1 g/dL (ref 30.0–36.0)
MCV: 103.8 fL — AB (ref 78.0–100.0)
MCV: 104.1 fL — ABNORMAL HIGH (ref 78.0–100.0)
Platelets: 137 10*3/uL — ABNORMAL LOW (ref 150–400)
Platelets: 140 10*3/uL — ABNORMAL LOW (ref 150–400)
RBC: 3.39 MIL/uL — AB (ref 4.22–5.81)
RBC: 3.4 MIL/uL — ABNORMAL LOW (ref 4.22–5.81)
RDW: 12.9 % (ref 11.5–15.5)
RDW: 12.9 % (ref 11.5–15.5)
WBC: 6.1 10*3/uL (ref 4.0–10.5)
WBC: 6.1 10*3/uL (ref 4.0–10.5)

## 2016-08-19 LAB — I-STAT TROPONIN, ED
Troponin i, poc: 0.01 ng/mL (ref 0.00–0.08)
Troponin i, poc: 0.02 ng/mL (ref 0.00–0.08)

## 2016-08-19 LAB — CREATININE, SERUM
Creatinine, Ser: 1.52 mg/dL — ABNORMAL HIGH (ref 0.61–1.24)
GFR calc Af Amer: 46 mL/min — ABNORMAL LOW (ref >60)
GFR calc non Af Amer: 39 mL/min — ABNORMAL LOW (ref >60)

## 2016-08-19 LAB — TROPONIN I: Troponin I: 0.03 ng/mL (ref <0.03)

## 2016-08-19 MED ORDER — IPRATROPIUM BROMIDE 0.03 % NA SOLN
1.0000 | Freq: Every day | NASAL | Status: DC
  Filled 2016-08-19: qty 30

## 2016-08-19 MED ORDER — MEXILETINE HCL 200 MG PO CAPS
200.0000 mg | ORAL_CAPSULE | Freq: Two times a day (BID) | ORAL | Status: DC
  Administered 2016-08-19 – 2016-08-20 (×2): 200 mg via ORAL
  Filled 2016-08-19 (×2): qty 1

## 2016-08-19 MED ORDER — CLOPIDOGREL BISULFATE 75 MG PO TABS
75.0000 mg | ORAL_TABLET | Freq: Every day | ORAL | Status: DC
  Administered 2016-08-20: 75 mg via ORAL
  Filled 2016-08-19: qty 1

## 2016-08-19 MED ORDER — RANOLAZINE ER 500 MG PO TB12
1000.0000 mg | ORAL_TABLET | Freq: Two times a day (BID) | ORAL | Status: DC
  Administered 2016-08-19 – 2016-08-20 (×2): 1000 mg via ORAL
  Filled 2016-08-19 (×2): qty 2

## 2016-08-19 MED ORDER — ASPIRIN EC 81 MG PO TBEC
81.0000 mg | DELAYED_RELEASE_TABLET | Freq: Every day | ORAL | Status: DC
  Administered 2016-08-20: 81 mg via ORAL
  Filled 2016-08-19: qty 1

## 2016-08-19 MED ORDER — NITROGLYCERIN 0.4 MG SL SUBL: 0.4000 mg | SUBLINGUAL_TABLET | SUBLINGUAL | Status: DC | PRN

## 2016-08-19 MED ORDER — ATORVASTATIN CALCIUM 10 MG PO TABS
10.0000 mg | ORAL_TABLET | Freq: Every day | ORAL | Status: DC
  Administered 2016-08-19: 10 mg via ORAL
  Filled 2016-08-19: qty 1

## 2016-08-19 MED ORDER — IPRATROPIUM BROMIDE 0.06 % NA SOLN
2.0000 | Freq: Every day | NASAL | Status: DC
  Administered 2016-08-19 – 2016-08-20 (×2): 2 via NASAL
  Filled 2016-08-19 (×2): qty 15

## 2016-08-19 MED ORDER — AMLODIPINE 1 MG/ML ORAL SUSPENSION
2.5000 mg | Freq: Every day | ORAL | Status: DC
Start: 2016-08-19 — End: 2016-08-19

## 2016-08-19 MED ORDER — CARBOXYMETHYLCELLULOSE SODIUM 0.5 % OP SOLN: 1.0000 [drp] | Freq: Four times a day (QID) | OPHTHALMIC | Status: DC

## 2016-08-19 MED ORDER — LOSARTAN POTASSIUM 25 MG PO TABS
25.0000 mg | ORAL_TABLET | Freq: Every day | ORAL | Status: DC
  Administered 2016-08-20: 25 mg via ORAL
  Filled 2016-08-19: qty 1

## 2016-08-19 MED ORDER — RANOLAZINE ER 500 MG PO TB12
500.0000 mg | ORAL_TABLET | Freq: Two times a day (BID) | ORAL | Status: DC
  Filled 2016-08-19: qty 1

## 2016-08-19 MED ORDER — SODIUM CHLORIDE 0.9% FLUSH
3.0000 mL | Freq: Two times a day (BID) | INTRAVENOUS | Status: DC
  Administered 2016-08-19: 3 mL via INTRAVENOUS

## 2016-08-19 MED ORDER — ISOSORBIDE MONONITRATE ER 30 MG PO TB24: 60.0000 mg | ORAL_TABLET | Freq: Every day | ORAL | Status: DC

## 2016-08-19 MED ORDER — POLYVINYL ALCOHOL 1.4 % OP SOLN
1.0000 [drp] | Freq: Four times a day (QID) | OPHTHALMIC | Status: DC
  Administered 2016-08-19 – 2016-08-20 (×2): 1 [drp] via OPHTHALMIC
  Filled 2016-08-19: qty 15

## 2016-08-19 MED ORDER — ASPIRIN EC 81 MG PO TBEC: 81.0000 mg | DELAYED_RELEASE_TABLET | Freq: Every day | ORAL | Status: DC

## 2016-08-19 MED ORDER — PANTOPRAZOLE SODIUM 40 MG PO TBEC
40.0000 mg | DELAYED_RELEASE_TABLET | Freq: Every day | ORAL | Status: DC
  Administered 2016-08-20: 40 mg via ORAL
  Filled 2016-08-19: qty 1

## 2016-08-19 MED ORDER — ISOSORBIDE MONONITRATE ER 60 MG PO TB24
180.0000 mg | ORAL_TABLET | Freq: Every day | ORAL | Status: DC
  Administered 2016-08-20: 180 mg via ORAL
  Filled 2016-08-19: qty 3

## 2016-08-19 MED ORDER — ADULT MULTIVITAMIN W/MINERALS CH
2.0000 | ORAL_TABLET | Freq: Every day | ORAL | Status: DC
  Filled 2016-08-19: qty 2

## 2016-08-19 MED ORDER — HEPARIN SODIUM (PORCINE) 5000 UNIT/ML IJ SOLN
5000.0000 [IU] | Freq: Three times a day (TID) | INTRAMUSCULAR | Status: DC
  Administered 2016-08-19 – 2016-08-20 (×2): 5000 [IU] via SUBCUTANEOUS
  Filled 2016-08-19 (×2): qty 1

## 2016-08-19 MED ORDER — SODIUM CHLORIDE 0.9 % IV SOLN: 250.0000 mL | INTRAVENOUS | Status: DC | PRN

## 2016-08-19 MED ORDER — ISOSORBIDE MONONITRATE ER 30 MG PO TB24: 120.0000 mg | ORAL_TABLET | Freq: Every day | ORAL | Status: DC

## 2016-08-19 MED ORDER — VITAMIN B-12 1000 MCG PO TABS
2000.0000 ug | ORAL_TABLET | Freq: Every day | ORAL | Status: DC
  Administered 2016-08-20: 2000 ug via ORAL
  Filled 2016-08-19: qty 2

## 2016-08-19 MED ORDER — LACTASE 3000 UNITS PO TABS
3000.0000 [IU] | ORAL_TABLET | Freq: Every day | ORAL | Status: DC
  Administered 2016-08-20: 3000 [IU] via ORAL
  Filled 2016-08-19: qty 1

## 2016-08-19 MED ORDER — ONDANSETRON HCL 4 MG/2ML IJ SOLN: 4.0000 mg | Freq: Four times a day (QID) | INTRAMUSCULAR | Status: DC | PRN

## 2016-08-19 MED ORDER — POLYETHYLENE GLYCOL 3350 17 GM/SCOOP PO POWD
17.0000 g | Freq: Every day | ORAL | Status: DC
  Filled 2016-08-19: qty 255

## 2016-08-19 MED ORDER — POLYETHYLENE GLYCOL 3350 17 G PO PACK: 17.0000 g | PACK | Freq: Every day | ORAL | Status: DC

## 2016-08-19 MED ORDER — AMLODIPINE BESYLATE 2.5 MG PO TABS
2.5000 mg | ORAL_TABLET | Freq: Every day | ORAL | Status: DC
  Administered 2016-08-20: 2.5 mg via ORAL
  Filled 2016-08-19: qty 1

## 2016-08-19 MED ORDER — ACETAMINOPHEN 325 MG PO TABS: 650.0000 mg | ORAL_TABLET | ORAL | Status: DC | PRN

## 2016-08-19 NOTE — Progress Notes (Signed)
Critical troponin 0.03 received. Azalee Course, [place] made aware.

## 2016-08-19 NOTE — Progress Notes (Signed)
Patient arrived from ED to 3e18.  Patient is alert and oriented x4 and has no complaints of pain.  Patient oriented to call bell and phone and how to call if needs help.

## 2016-08-19 NOTE — ED Provider Notes (Signed)
[facility]-EMERGENCY DEPT Provider Note   CSN: [id number] Arrival date & time: 08/19/16  0940     History   Chief Complaint No chief complaint on file.   HPI Karl Stevenson is a 83 y.o. male.  Patient presents for evaluation of chest discomfort, which began around 1 AM this morning, and continues here in the emergency department.  Around the time of onset he took 2 sublingual nitroglycerin without effect on the pain.  He has not taken nitro recently.  He feels that the nitro did not do anything to help but he eventually went back to sleep.  The pain was there on awakening this morning, but he did not take anything for it.  He did not eat breakfast this morning.  He lives with his wife but she is currently out of town.  He presents by EMS, and was treated with aspirin during transport.  No recent problems with his heart.  He cannot recall his last cardiac intervention.  He denies fever, chills, nausea, vomiting, shortness of breath, back pain or dizziness.  There are no other known modifying factors.  HPI  Past Medical History:  Diagnosis Date  . AAA (abdominal aortic aneurysm) (HCC)    a. Ectatic abdominal aorta with fusiform dilitation 3.4x3.4 and moderate thrombus burden 12/2010  . Bradycardia    a. previous intolerance to beta blockers - low dose toprol started 10/2011 for tachycardia but had rash and was discontinued;  b. tolerating low dose propranolol.  . Carotid artery occlusion    a. s/p L CEA in 2010;  b. 06/2014 Carotid U/S: stable LICA CEA site w/o significant stenosis bilat.  . Chronic systolic heart failure (HCC)    a. 02/2015 Echo:  35-40% (echo 11/2010); c. 02/2015 Echo: EF 20-25%, diff HK, mid-apicalanteroseptal and apical AK, Gr 1 DD, mild MR.  [name] COPD (chronic obstructive pulmonary disease) (HCC)   . Coronary artery disease    a. 1969 s/p MI;  b. S/P CABG 1987;  c. S/P redo 2008;  d. 07/2012 Cath: stable->Med Rx, EF 40%; e. 02/2014 MV: anterior and posterolateral/apical infarct  with mild peri-infarct ischemia->Med Rx; f. 02/2015 Cath: LM 100d, LAD 100ost, LCX 100ost, OM2 100, VG->OM3->OM4 nl, VG-> LAD nl (Y from VG->OM), VG->OM2 100p.  . Crohn's disease (HCC)    a. s/p colon resection  . Gout   . Hyperlipidemia   . Hypertension   . Ischemic cardiomyopathy    a. EF 34% (Lexiscan 06/2011); b. 35-40% (echo 11/2010); c. 02/2015 Echo: EF 20-25%, diff HK, mid-apicalanteroseptal and apical AK, Gr 1 DD, mild MR.  . Peripheral vascular disease (HCC)    a. Carotid dz s/p L CEA, RAS, AAA, LE dz  . RBBB   . Renal artery stenosis (HCC)    a. Dopplers 12/2010 - 1-59% R ostial renal artery stenosis, 2 L renal arteries both widely patent  . Syncope   . Tachycardia    a. 10/2011 - atrial tach vs avnrt - BB started.  . Ventricular tachycardia (HCC)    a. 02/2015 ->amio 200 daily added-->later switched to mexilitene.    Patient Active Problem List   Diagnosis Date Noted  . Orthostatic hypotension 10/25/2015  . Dehydration 10/24/2015  . Nausea and vomiting 10/24/2015  . Diarrhea 10/24/2015  . HLD (hyperlipidemia) 08/15/2015  . Midsternal chest pain 07/28/2015  . Syncope 04/27/2015  . Ventricular tachycardia (HCC) 03/07/2015  . NSVT (nonsustained ventricular tachycardia) (HCC) 03/05/2015  . Unstable angina pectoris (HCC) 02/12/2014  . Abdominal  pain, unspecified site 02/14/2013    Class: Acute  . SBO (small bowel obstruction) 02/10/2013  . Ankle edema 09/02/2012  . Chronic venous insufficiency 09/02/2012  . Unstable angina (HCC) 07/28/2012  . CKD (chronic kidney disease), stage III 07/28/2012  . Atherosclerosis of native arteries of the extremities, unspecified 10/30/2011  . Tachycardia 10/18/2011  . Bradycardia 10/18/2011  . RBBB 10/17/2011  . Chest pain 10/17/2011  . Sinus tachycardia 10/17/2011  . Aftercare following surgery of the circulatory system, NEC 09/11/2011  . Bilateral femoral artery stenosis (HCC) 09/11/2011  . Hypotension 08/27/2011  . Peripheral  vascular disease, unspecified (HCC) 07/24/2011  . Ischemic cardiomyopathy 06/16/2011  . Chronic systolic heart failure (HCC)   . Occlusion and stenosis of carotid artery without mention of cerebral infarction 06/05/2011  . Pain, limb, left 06/05/2011  . Intermittent claudication (HCC) 06/05/2011  . Carotid stenosis 06/05/2011  . Atherosclerosis of renal artery (HCC) 01/06/2011  . CAD (coronary artery disease) 11/21/2010  . HTN (hypertension) 11/21/2010  . PVD (peripheral vascular disease) (HCC) 11/21/2010  . Crohn's disease (HCC)   . Anemia   . Anemia     Past Surgical History:  Procedure Laterality Date  . ABDOMINAL AORTAGRAM N/A 06/11/2011   Procedure: ABDOMINAL [name];  Surgeon: [name], MD;  Location: [facility] CATH LAB;  Service: Cardiovascular;  Laterality: N/A;  . CARDIAC CATHETERIZATION  07/28/2012   Native 3v CAD, continued patency of the SVG-OM1-LAD and SVG-OM2-left PDA. LVEF 40% with multiple WMAs  . CARDIAC CATHETERIZATION N/A 03/08/2015   Procedure: Left Heart Cath and Coronary Angiography;  Surgeon: [name], MD;  Location: [facility] INVASIVE CV LAB;  Service: Cardiovascular;  Laterality: N/A;  . CAROTID ENDARTERECTOMY Left 2010  . CATARACT EXTRACTION, BILATERAL    . COLON RESECTION  2008  . COLON SURGERY    . CORONARY ANGIOPLASTY    . CORONARY ARTERY BYPASS GRAFT  10/31/85 & 08/19/06  . ENDARTERECTOMY  08/26/2011   Procedure: ENDARTERECTOMY ILIAC;  Surgeon: [name], MD;  Location: [facility] OR;  Service: Vascular;  Laterality: Right;  Right Femoral Artery Endarterectomy with vascu guard patch angioplasty & intraoperative arteriogram.  . EYE SURGERY    . FEMORAL ENDARTERECTOMY Left  06/2011   ileofemoral endarterectomy with bovine patch angioplasty  . LEFT HEART CATHETERIZATION WITH CORONARY ANGIOGRAM N/A 07/28/2012   Procedure: LEFT HEART CATHETERIZATION WITH CORONARY ANGIOGRAM;  Surgeon: [name], MD;  Location: [facility] CATH LAB;  Service: Cardiovascular;  Laterality:  N/A;  . PR VEIN BYPASS GRAFT,AORTO-FEM-POP  10/1985  . TONSILLECTOMY     as a child       Home Medications    Prior to Admission medications   Medication Sig Start Date End Date Taking? Authorizing Provider  acetaminophen (TYLENOL) 500 MG tablet Take 1,000 mg by mouth every 6 (six) hours as needed (pain).     Historical Provider, MD  aspirin EC 81 MG tablet Take 81 mg by mouth daily.    Historical Provider, MD  atorvastatin (LIPITOR) 10 MG tablet Take 10 mg by mouth at bedtime.     Historical Provider, MD  carboxymethylcellulose (REFRESH TEARS) 0.5 % SOLN Place 1 drop into both eyes 4 (four) times daily.    Historical Provider, MD  clopidogrel (PLAVIX) 75 MG tablet TAKE ONE TABLET BY MOUTH ONCE DAILY 01/20/16   [name], MD  ipratropium (ATROVENT) 0.03 % nasal spray Place 1 spray into both nostrils daily.     Historical Provider, MD  isosorbide mononitrate (IMDUR) 120  MG 24 hr tablet TAKE ONE TABLET BY MOUTH ONCE DAILY 03/09/16   [name], MD  isosorbide mononitrate (IMDUR) 60 MG 24 hr tablet TAKE ONE TABLET BY MOUTH ONCE DAILY IN ADDITION  TO ISOSORBIDE 120 MG TABLET DAILY 02/25/16   Ok Anis, NP  lactase (LACTAID) 3000 units tablet Take 1,000 Units by mouth daily with breakfast.    Historical Provider, MD  losartan (COZAAR) 25 MG tablet TAKE ONE TABLET BY MOUTH ONCE DAILY 08/05/16   Beatrice Lecher, PA-C  mexiletine (MEXITIL) 200 MG capsule TAKE ONE CAPSULE BY MOUTH EVERY 12 HOURS 08/12/16   Duke Salvia, MD  Multiple Vitamin (MULTIVITAMIN WITH MINERALS) TABS tablet Take 2 tablets by mouth daily.    Historical Provider, MD  nitroGLYCERIN (NITROSTAT) 0.4 MG SL tablet Place 0.4 mg under the tongue every 5 (five) minutes as needed for chest pain.    Historical Provider, MD  OVER THE COUNTER MEDICATION Take 1 scoop by mouth daily. Barley Life by Aim - OTC    Historical Provider, MD  pantoprazole (PROTONIX) 40 MG tablet TAKE ONE TABLET BY MOUTH ONCE DAILY 01/14/16   [name], MD  polyethylene glycol powder (GLYCOLAX/MIRALAX) powder Take 17 g by mouth at bedtime. Mix in 8 oz liquid and drink 10/15/15   Historical Provider, MD  ranolazine (RANEXA) 500 MG 12 hr tablet Take 2 tablets (1,000 mg total) by mouth 2 (two) times daily. 01/14/16   [name], MD  vitamin B-12 (CYANOCOBALAMIN) 1000 MCG tablet Take 2,000 mcg by mouth daily.    Historical Provider, MD    Family History Family History  Problem Relation Age of Onset  . Heart disease Father   . Cancer Mother   . COPD Mother   . Deep vein thrombosis Brother   . COPD Brother   . CAD Brother   . Anesthesia problems Neg Hx     Social History Social History  Substance Use Topics  . Smoking status: Never Smoker  . Smokeless tobacco: Never Used  . Alcohol use No     Allergies   Novocain [procaine hcl]; Procaine; and Metoprolol   Review of Systems Review of Systems  All other systems reviewed and are negative.    Physical Exam Updated Vital Signs BP 126/74   Pulse 78   Temp 97.7 F (36.5 C) (Oral)   Resp (!) 24   SpO2 99%   Physical Exam  Constitutional: He is oriented to person, place, and time. He appears well-developed.  Elderly, frail  HENT:  Head: Normocephalic and atraumatic.  Right Ear: External ear normal.  Left Ear: External ear normal.  Eyes: Conjunctivae and EOM are normal. Pupils are equal, round, and reactive to light.  Neck: Normal range of motion and phonation normal. Neck supple.  Cardiovascular: Normal rate, regular rhythm and normal heart sounds.   Pulmonary/Chest: Effort normal and breath sounds normal. He exhibits no tenderness and no bony tenderness.  Abdominal: Soft. He exhibits no distension. There is no tenderness.  Musculoskeletal: Normal range of motion. He exhibits no edema, tenderness or deformity.  Neurological: He is alert and oriented to person, place, and time. No cranial nerve deficit or sensory deficit. He exhibits normal muscle tone.  Coordination normal.  Skin: Skin is warm, dry and intact.  Psychiatric: He has a normal mood and affect. His behavior is normal. Judgment and thought content normal.  Nursing note and vitals reviewed.    ED Treatments / Results  Labs (all labs ordered  are listed, but only abnormal results are displayed) Labs Reviewed  BASIC METABOLIC PANEL - Abnormal; Notable for the following:       Result Value   Glucose, Bld 116 (*)    BUN 25 (*)    Creatinine, Ser 1.47 (*)    Calcium 8.8 (*)    GFR calc non Af Amer 41 (*)    GFR calc Af Amer 48 (*)    All other components within normal limits  CBC - Abnormal; Notable for the following:    RBC 3.39 (*)    Hemoglobin 11.9 (*)    HCT 35.2 (*)    MCV 103.8 (*)    MCH 35.1 (*)    Platelets 137 (*)    All other components within normal limits  I-STAT TROPOININ, ED  I-STAT TROPOININ, ED    EKG  EKG Interpretation  Date/Time:  Wednesday August 19 2016 09:41:04 EDT Ventricular Rate:  83 PR Interval:    QRS Duration: 189 QT Interval:  469 QTC Calculation: 552 R Axis:   -30 Text Interpretation:  Sinus or ectopic atrial rhythm Prolonged PR interval Right bundle branch block since last tracing no significant change Confirmed by Effie Shy  MD, Sophia Sperry [phone]) on 08/19/2016 9:54:22 AM       Radiology Dg Chest 2 View  Result Date: 08/19/2016 CLINICAL DATA:  Midline chest pain today, history of hypertension and ischemic cardiomyopathy EXAM: CHEST  2 VIEW COMPARISON:  Chest x-ray of 10/24/2015 FINDINGS: No active infiltrate or effusion is seen. Mediastinal and hilar contours are unremarkable. The lungs remain somewhat hyperaerated. The heart is within upper limits normal. Median sternotomy sutures are noted prior CABG. No acute bony abnormality is seen. IMPRESSION: Hyperaeration. No active lung disease. Stable borderline cardiomegaly. Electronically Signed   By: Dwyane Dee M.D.   On: 08/19/2016 11:06    Procedures Procedures (including critical  care time)  Medications Ordered in ED Medications - No data to display   Initial Impression / Assessment and Plan / ED Course  I have reviewed the triage vital signs and the nursing notes.  Pertinent labs & imaging results that were available during my care of the patient were reviewed by me and considered in my medical decision making (see chart for details).  Clinical Course as of Aug 19 1412  Wed Aug 19, 2016  1411 Case discussed with cardiology, Dr. Gery Pray, who will see the patient in the emergency department  [EW]    Clinical Course User Index [EW] Mancel Bale, MD    Medications - No data to display  Patient Vitals for the past 24 hrs:  BP Temp Temp src Pulse Resp SpO2  08/19/16 1300 126/74 - - 78 (!) 24 99 %  08/19/16 1245 110/75 - - 77 (!) 23 98 %  08/19/16 1230 (!) 117/94 - - 77 16 100 %  08/19/16 1200 126/76 - - 79 12 96 %  08/19/16 1145 122/71 - - 77 16 100 %  08/19/16 1054 (!) 155/81 - - 80 14 97 %  08/19/16 0944 131/83 97.7 F (36.5 C) Oral 87 19 97 %    2:12 PM Reevaluation with update and discussion. After initial assessment and treatment, an updated evaluation reveals he continues to have chest "tightness", which he now describes as 3/10.Mancel Bale L    Final Clinical Impressions(s) / ED Diagnoses   Final diagnoses:  Nonspecific chest pain    Nonspecific chest pain, with known coronary disease, CABG, and disease and CABG vessels.  Patient stable in the ED but continues to have chest discomfort, and has had 2 negative troponins.  Patient to be evaluated by cardiology, prior to disposition.  Nursing Notes Reviewed/ Care Coordinated Applicable Imaging Reviewed Interpretation of Laboratory Data incorporated into ED treatment  Plan-cardiology evaluation in the emergency department  New Prescriptions New Prescriptions   No medications on file     Mancel Bale, MD 08/19/16 1414

## 2016-08-19 NOTE — ED Notes (Signed)
Patient transported to X-ray 

## 2016-08-19 NOTE — ED Triage Notes (Signed)
Pt arrives via [facility]Ms from home with substernal chest pressure that comes and goes while at rest since 3am. Pt had 324mg , 20g LAC. VSS. Denies pain. NAD at present. VSS.

## 2016-08-19 NOTE — ED Notes (Signed)
Ordered pt lunch tray 

## 2016-08-19 NOTE — H&P (Signed)
Patient ID: Karl Stevenson MRN: [id number], DOB/AGE: 81-Dec-1928   Admit date: 08/19/2016   Primary Physician: Garlan Fillers, MD Primary Cardiologist: Dr Excell Seltzer  HPI: 83 y/o male with a hx of CAD status post CABG (original surgery in 1987, redo in 2008). Lastcardiac catheterization in 10/16 with stable anatomy and no target for PCI. Decision was made to continue medical therapy. Other problems include ischemic cardiomyopathy, chronic systolic CHF - last echo from July 2017 showed his EF had improved back to his baseline (35%), HTN, PAD, status post L CEA, s/p bif fem BPG, known AAA, mild R renal artery stenosis, and COPD. He was admitted in 11/16 with ventricular tachycardia. Patient and his family wanted to avoid device implantation,  therefore, he was treated medically. He was placed on amiodarone, mexiletine and Ranexa. Amiodarone was DC'd 2/2 bradycardia.   He presents to the ED now with mid sternal chest pain. The pt says he woke up at 3 am and felt "sharp" mid sternal pain that would last "seconds" the go away and come back in a few seconds. He took NTG x 2 without relief. His wife is out of town. He tried to call the office to see if he could be seen in the office but the answering service put him on hold "for 30 minutes" and he gave up and called 911.  He denies any associated SOB, nausea, or diaphoresis. He continues to complain of "sharp" chest pain. No change with movement, palpation, or deep breathing.    Problem List: Past Medical History:  Diagnosis Date  . AAA (abdominal aortic aneurysm) (HCC)    a. Ectatic abdominal aorta with fusiform dilitation 3.4x3.4 and moderate thrombus burden 12/2010  . Bradycardia    a. previous intolerance to beta blockers - low dose toprol started 10/2011 for tachycardia but had rash and was discontinued;  b. tolerating low dose propranolol.  . Carotid artery occlusion    a. s/p L CEA in 2010;  b. 06/2014 Carotid U/S: stable LICA CEA site  w/o significant stenosis bilat.  . Chronic systolic heart failure (HCC)    a. 02/2015 Echo:  35-40% (echo 11/2010); c. 02/2015 Echo: EF 20-25%, diff HK, mid-apicalanteroseptal and apical AK, Gr 1 DD, mild MR.  [name] COPD (chronic obstructive pulmonary disease) (HCC)   . Coronary artery disease    a. 1969 s/p MI;  b. S/P CABG 1987;  c. S/P redo 2008;  d. 07/2012 Cath: stable->Med Rx, EF 40%; e. 02/2014 MV: anterior and posterolateral/apical infarct with mild peri-infarct ischemia->Med Rx; f. 02/2015 Cath: LM 100d, LAD 100ost, LCX 100ost, OM2 100, VG->OM3->OM4 nl, VG-> LAD nl (Y from VG->OM), VG->OM2 100p.  . Crohn's disease (HCC)    a. s/p colon resection  . Gout   . Hyperlipidemia   . Hypertension   . Ischemic cardiomyopathy    a. EF 34% (Lexiscan 06/2011); b. 35-40% (echo 11/2010); c. 02/2015 Echo: EF 20-25%, diff HK, mid-apicalanteroseptal and apical AK, Gr 1 DD, mild MR.  . Peripheral vascular disease (HCC)    a. Carotid dz s/p L CEA, RAS, AAA, LE dz  . RBBB   . Renal artery stenosis (HCC)    a. Dopplers 12/2010 - 1-59% R ostial renal artery stenosis, 2 L renal arteries both widely patent  . Syncope   . Tachycardia    a. 10/2011 - atrial tach vs avnrt - BB started.  . Ventricular tachycardia (HCC)    a. 02/2015 ->amio 200 daily added-->later switched to  mexilitene.    Past Surgical History:  Procedure Laterality Date  . ABDOMINAL AORTAGRAM N/A 06/11/2011   Procedure: ABDOMINAL [name];  Surgeon: [name], MD;  Location: [facility] CATH LAB;  Service: Cardiovascular;  Laterality: N/A;  . CARDIAC CATHETERIZATION  07/28/2012   Native 3v CAD, continued patency of the SVG-OM1-LAD and SVG-OM2-left PDA. LVEF 40% with multiple WMAs  . CARDIAC CATHETERIZATION N/A 03/08/2015   Procedure: Left Heart Cath and Coronary Angiography;  Surgeon: [name], MD;  Location: [facility] INVASIVE CV LAB;  Service: Cardiovascular;  Laterality: N/A;  . CAROTID ENDARTERECTOMY Left 2010  . CATARACT EXTRACTION, BILATERAL      . COLON RESECTION  2008  . COLON SURGERY    . CORONARY ANGIOPLASTY    . CORONARY ARTERY BYPASS GRAFT  10/31/85 & 08/19/06  . ENDARTERECTOMY  08/26/2011   Procedure: ENDARTERECTOMY ILIAC;  Surgeon: [name], MD;  Location: [facility] OR;  Service: Vascular;  Laterality: Right;  Right Femoral Artery Endarterectomy with vascu guard patch angioplasty & intraoperative arteriogram.  . EYE SURGERY    . FEMORAL ENDARTERECTOMY Left  06/2011   ileofemoral endarterectomy with bovine patch angioplasty  . LEFT HEART CATHETERIZATION WITH CORONARY ANGIOGRAM N/A 07/28/2012   Procedure: LEFT HEART CATHETERIZATION WITH CORONARY ANGIOGRAM;  Surgeon: [name], MD;  Location: [facility] CATH LAB;  Service: Cardiovascular;  Laterality: N/A;  . PR VEIN BYPASS GRAFT,AORTO-FEM-POP  10/1985  . TONSILLECTOMY     as a child     Allergies:  Allergies  Allergen Reactions  . Novocain [Procaine Hcl] Other (See Comments)    Cold sweats and very bad headache.   . Procaine Other (See Comments)  . Metoprolol Hives, Itching and Rash     Home Medications  Past Medical History:  Diagnosis Date  . AAA (abdominal aortic aneurysm) (HCC)    a. Ectatic abdominal aorta with fusiform dilitation 3.4x3.4 and moderate thrombus burden 12/2010  . Bradycardia    a. previous intolerance to beta blockers - low dose toprol started 10/2011 for tachycardia but had rash and was discontinued;  b. tolerating low dose propranolol.  . Carotid artery occlusion    a. s/p L CEA in 2010;  b. 06/2014 Carotid U/S: stable LICA CEA site w/o significant stenosis bilat.  . Chronic systolic heart failure (HCC)    a. 02/2015 Echo:  35-40% (echo 11/2010); c. 02/2015 Echo: EF 20-25%, diff HK, mid-apicalanteroseptal and apical AK, Gr 1 DD, mild MR.  [name] COPD (chronic obstructive pulmonary disease) (HCC)   . Coronary artery disease    a. 1969 s/p MI;  b. S/P CABG 1987;  c. S/P redo 2008;  d. 07/2012 Cath: stable->Med Rx, EF 40%; e. 02/2014 MV: anterior and  posterolateral/apical infarct with mild peri-infarct ischemia->Med Rx; f. 02/2015 Cath: LM 100d, LAD 100ost, LCX 100ost, OM2 100, VG->OM3->OM4 nl, VG-> LAD nl (Y from VG->OM), VG->OM2 100p.  . Crohn's disease (HCC)    a. s/p colon resection  . Gout   . Hyperlipidemia   . Hypertension   . Ischemic cardiomyopathy    a. EF 34% (Lexiscan 06/2011); b. 35-40% (echo 11/2010); c. 02/2015 Echo: EF 20-25%, diff HK, mid-apicalanteroseptal and apical AK, Gr 1 DD, mild MR.  . Peripheral vascular disease (HCC)    a. Carotid dz s/p L CEA, RAS, AAA, LE dz  . RBBB   . Renal artery stenosis (HCC)    a. Dopplers 12/2010 - 1-59% R ostial renal artery stenosis, 2 L renal arteries both widely patent  .  Syncope   . Tachycardia    a. 10/2011 - atrial tach vs avnrt - BB started.  . Ventricular tachycardia (HCC)    a. 02/2015 ->amio 200 daily added-->later switched to mexilitene.    Prior to Admission medications   Medication Sig Start Date End Date Taking? Authorizing Provider  acetaminophen (TYLENOL) 500 MG tablet Take 1,000 mg by mouth every 6 (six) hours as needed (pain).     Historical Provider, MD  aspirin EC 81 MG tablet Take 81 mg by mouth daily.    Historical Provider, MD  atorvastatin (LIPITOR) 10 MG tablet Take 10 mg by mouth at bedtime.     Historical Provider, MD  carboxymethylcellulose (REFRESH TEARS) 0.5 % SOLN Place 1 drop into both eyes 4 (four) times daily.    Historical Provider, MD  clopidogrel (PLAVIX) 75 MG tablet TAKE ONE TABLET BY MOUTH ONCE DAILY 01/20/16   [name], MD  ipratropium (ATROVENT) 0.03 % nasal spray Place 1 spray into both nostrils daily.     Historical Provider, MD  isosorbide mononitrate (IMDUR) 120 MG 24 hr tablet TAKE ONE TABLET BY MOUTH ONCE DAILY 03/09/16   [name], MD  isosorbide mononitrate (IMDUR) 60 MG 24 hr tablet TAKE ONE TABLET BY MOUTH ONCE DAILY IN ADDITION  TO ISOSORBIDE 120 MG TABLET DAILY 02/25/16   Ok Anis, NP  lactase (LACTAID) 3000  units tablet Take 1,000 Units by mouth daily with breakfast.    Historical Provider, MD  losartan (COZAAR) 25 MG tablet TAKE ONE TABLET BY MOUTH ONCE DAILY 08/05/16   Beatrice Lecher, PA-C  mexiletine (MEXITIL) 200 MG capsule TAKE ONE CAPSULE BY MOUTH EVERY 12 HOURS 08/12/16   Duke Salvia, MD  Multiple Vitamin (MULTIVITAMIN WITH MINERALS) TABS tablet Take 2 tablets by mouth daily.    Historical Provider, MD  nitroGLYCERIN (NITROSTAT) 0.4 MG SL tablet Place 0.4 mg under the tongue every 5 (five) minutes as needed for chest pain.    Historical Provider, MD  OVER THE COUNTER MEDICATION Take 1 scoop by mouth daily. Barley Life by Aim - OTC    Historical Provider, MD  pantoprazole (PROTONIX) 40 MG tablet TAKE ONE TABLET BY MOUTH ONCE DAILY 01/14/16   [name], MD  polyethylene glycol powder (GLYCOLAX/MIRALAX) powder Take 17 g by mouth at bedtime. Mix in 8 oz liquid and drink 10/15/15   Historical Provider, MD  ranolazine (RANEXA) 500 MG 12 hr tablet Take 2 tablets (1,000 mg total) by mouth 2 (two) times daily. 01/14/16   [name], MD  vitamin B-12 (CYANOCOBALAMIN) 1000 MCG tablet Take 2,000 mcg by mouth daily.    Historical Provider, MD     Family History  Problem Relation Age of Onset  . Heart disease Father   . Cancer Mother   . COPD Mother   . Deep vein thrombosis Brother   . COPD Brother   . CAD Brother   . Anesthesia problems Neg Hx      Social History   Social History  . Marital status: Married    Spouse name: N/A  . Number of children: N/A  . Years of education: N/A   Occupational History  . Not on file.   Social History Main Topics  . Smoking status: Never Smoker  . Smokeless tobacco: Never Used  . Alcohol use No  . Drug use: No  . Sexual activity: Not on file   Other Topics Concern  . Not on file   Social History Narrative  .  No narrative on file     Review of Systems: General: negative for chills, fever, night sweats or weight changes.  Cardiovascular:  negative for dyspnea on exertion, edema, orthopnea, palpitations, paroxysmal nocturnal dyspnea or shortness of breath HEENT: negative for any visual disturbances, blindness, glaucoma Dermatological: negative for rash Respiratory: negative for cough, hemoptysis, or wheezing Urologic: negative for hematuria or dysuria Abdominal: negative for nausea, vomiting, diarrhea, bright red blood per rectum, melena, or hematemesis Neurologic: negative for visual changes, syncope, or dizziness Musculoskeletal: negative for back pain, joint pain, or swelling Psych: cooperative and appropriate All other systems reviewed and are otherwise negative except as noted above.  Physical Exam: Blood pressure (!) 172/97, pulse 79, temperature 97.7 F (36.5 C), temperature source Oral, resp. rate 15, SpO2 100 %.  General appearance: alert, cooperative, appears stated age and no distress Neck: no carotid bruit, no JVD and LCE scar Lungs: clear to auscultation bilaterally Heart: regular rate and rhythm Abdomen: soft, non-tender; bowel sounds normal; no masses,  no organomegaly and suprapubic surgical scar (SBO post CABG), and bi femoral surgical scars Extremities: no edema Pulses: 2+ and symmetric Skin: Skin color, texture, turgor normal. No rashes or lesions Neurologic: Grossly normal    Labs:   Results for orders placed or performed during the hospital encounter of 08/19/16 (from the past 24 hour(s))  Basic metabolic panel     Status: Abnormal   Collection Time: 08/19/16  9:57 AM  Result Value Ref Range   Sodium 137 135 - 145 mmol/L   Potassium 4.3 3.5 - 5.1 mmol/L   Chloride 105 101 - 111 mmol/L   CO2 23 22 - 32 mmol/L   Glucose, Bld 116 (H) 65 - 99 mg/dL   BUN 25 (H) 6 - 20 mg/dL   Creatinine, Ser 1.61 (H) 0.61 - 1.24 mg/dL   Calcium 8.8 (L) 8.9 - 10.3 mg/dL   GFR calc non Af Amer 41 (L) >60 mL/min   GFR calc Af Amer 48 (L) >60 mL/min   Anion gap 9 5 - 15  CBC     Status: Abnormal   Collection  Time: 08/19/16  9:57 AM  Result Value Ref Range   WBC 6.1 4.0 - 10.5 K/uL   RBC 3.39 (L) 4.22 - 5.81 MIL/uL   Hemoglobin 11.9 (L) 13.0 - 17.0 g/dL   HCT 09.6 (L) 04.5 - 40.9 %   MCV 103.8 (H) 78.0 - 100.0 fL   MCH 35.1 (H) 26.0 - 34.0 pg   MCHC 33.8 30.0 - 36.0 g/dL   RDW 81.1 91.4 - 78.2 %   Platelets 137 (L) 150 - 400 K/uL  I-stat troponin, ED     Status: None   Collection Time: 08/19/16 10:12 AM  Result Value Ref Range   Troponin i, poc 0.01 0.00 - 0.08 ng/mL   Comment 3          I-stat troponin, ED     Status: None   Collection Time: 08/19/16 12:57 PM  Result Value Ref Range   Troponin i, poc 0.02 0.00 - 0.08 ng/mL   Comment 3             Radiology/Studies: Dg Chest 2 View  Result Date: 08/19/2016 CLINICAL DATA:  Midline chest pain today, history of hypertension and ischemic cardiomyopathy EXAM: CHEST  2 VIEW COMPARISON:  Chest x-ray of 10/24/2015 FINDINGS: No active infiltrate or effusion is seen. Mediastinal and hilar contours are unremarkable. The lungs remain somewhat hyperaerated. The heart is within  upper limits normal. Median sternotomy sutures are noted prior CABG. No acute bony abnormality is seen. IMPRESSION: Hyperaeration. No active lung disease. Stable borderline cardiomegaly. Electronically Signed   By: Dwyane Dee M.D.   On: 08/19/2016 11:06    EKG:NSR, 1st degree AVB, RBBB, rate 82  ASSESSMENT AND PLAN:     Chest pain with moderate risk of acute coronary syndrome Admit for observation. Troponin negative x 2. Could consider addition of low dose Norvasc but all other medications maxed out.     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   PLAN: I would decrease Ranexa to 500 mg BID and add Amlodipine for angina and HTN. Admit to observation.    Jolene Provost, PA-C 08/19/2016, 2:48 PM [phone]  Agree with note written by Corine Shelter [facility]   Agree with note by Corine Shelter PAC. We are asked to see this delightful 83 year old gentleman with long complex past medical history of CAD status post bypass grafting twice. He has LV dysfunction with EF of 35%, hypertension, peripheral arterial disease status post left carotid endarterectomy and aortobifemoral bypass grafting with a known abdominal aortic aneurysm plus renal artery stenosis. He has had ventricular tachycardia currently on mexiletine thought not to be a ICD candidate. His last cath revealed diffuse disease not amenable to intervention and medical therapy was recommended. The patient does not desire an invasive approach. His son was present during the interview and he is incompas mentis. His wife is in [place] on vacation until this Saturday. He has been stable until 3:00 this morning when he awoke with sharp chest pain as well as some chest pressure. He called our office and was unable to get through to the healthcare provider and therefore came to the emergency room. He is currently pain-free. His EKG shows trifascicular block. He is on maximal medical therapy including Ranexa. His exam is benign. His hemoglobin was stable. We will admit him for observation and cycle his enzymes. I'm going to decrease his Ranexa 500 mg by mouth twice a day and add low-dose amlodipine. Nanetta Batty 08/19/2016 3:26 PM

## 2016-08-19 NOTE — ED Notes (Signed)
ED Provider at bedside. 

## 2016-08-20 DIAGNOSIS — N183 Chronic kidney disease, stage 3 (moderate): Secondary | ICD-10-CM | POA: Diagnosis not present

## 2016-08-20 DIAGNOSIS — I4510 Unspecified right bundle-branch block: Secondary | ICD-10-CM | POA: Diagnosis not present

## 2016-08-20 DIAGNOSIS — I739 Peripheral vascular disease, unspecified: Secondary | ICD-10-CM | POA: Diagnosis not present

## 2016-08-20 DIAGNOSIS — I5022 Chronic systolic (congestive) heart failure: Secondary | ICD-10-CM | POA: Diagnosis not present

## 2016-08-20 DIAGNOSIS — Z951 Presence of aortocoronary bypass graft: Secondary | ICD-10-CM | POA: Diagnosis not present

## 2016-08-20 DIAGNOSIS — I129 Hypertensive chronic kidney disease with stage 1 through stage 4 chronic kidney disease, or unspecified chronic kidney disease: Secondary | ICD-10-CM | POA: Diagnosis not present

## 2016-08-20 DIAGNOSIS — I472 Ventricular tachycardia: Secondary | ICD-10-CM | POA: Diagnosis not present

## 2016-08-20 DIAGNOSIS — I440 Atrioventricular block, first degree: Secondary | ICD-10-CM | POA: Diagnosis not present

## 2016-08-20 DIAGNOSIS — I255 Ischemic cardiomyopathy: Secondary | ICD-10-CM | POA: Diagnosis not present

## 2016-08-20 DIAGNOSIS — J449 Chronic obstructive pulmonary disease, unspecified: Secondary | ICD-10-CM | POA: Diagnosis not present

## 2016-08-20 DIAGNOSIS — I452 Bifascicular block: Secondary | ICD-10-CM | POA: Diagnosis not present

## 2016-08-20 DIAGNOSIS — R079 Chest pain, unspecified: Secondary | ICD-10-CM | POA: Diagnosis not present

## 2016-08-20 DIAGNOSIS — I10 Essential (primary) hypertension: Secondary | ICD-10-CM | POA: Diagnosis not present

## 2016-08-20 LAB — TROPONIN I: Troponin I: 0.03 ng/mL (ref <0.03)

## 2016-08-20 MED ORDER — AMLODIPINE BESYLATE 2.5 MG PO TABS: 2.5000 mg | ORAL_TABLET | Freq: Every day | ORAL | 6 refills | Status: DC

## 2016-08-20 NOTE — Progress Notes (Signed)
Progress Note  Patient Name: Karl Stevenson Date of Encounter: 08/20/2016  Primary Cardiologist: Excell Seltzer  Subjective   Karl Stevenson is a 83 year old patient of Dr. Earmon Phoenix with a long complex past medical history of CAD status post bypass grafting in 1987 and redo in 2008. He has peripheral arterial disease as well as ventricular tachycardia on mexiletine not thought to be a candidate for ICD implantation. He was admitted with atypical chest pain. He ruled out for myocardial infarction. He is currently pain-free.  Inpatient Medications    Scheduled Meds: . amLODipine  2.5 mg Oral Daily  . aspirin EC  81 mg Oral Daily  . atorvastatin  10 mg Oral QHS  . clopidogrel  75 mg Oral Daily  . heparin  5,000 Units Subcutaneous Q8H  . ipratropium  2 spray Each Nare Daily  . isosorbide mononitrate  180 mg Oral Daily  . lactase  3,000 Units Oral Q breakfast  . losartan  25 mg Oral Daily  . mexiletine  200 mg Oral Q12H  . multivitamin with minerals  2 tablet Oral Daily  . pantoprazole  40 mg Oral Daily  . polyethylene glycol  17 g Oral QHS  . polyvinyl alcohol  1 drop Both Eyes QID  . ranolazine  1,000 mg Oral BID  . sodium chloride flush  3 mL Intravenous Q12H  . vitamin B-12  2,000 mcg Oral Daily   Continuous Infusions:  PRN Meds: sodium chloride, acetaminophen, nitroGLYCERIN, ondansetron (ZOFRAN) IV   Vital Signs    Vitals:   08/19/16 2100 08/20/16 0123 08/20/16 0553 08/20/16 0924  BP: (!) 142/51 (!) 117/48 140/74 (!) 118/52  Pulse: 67 70 74 67  Resp: 18 18 18 18   Temp: 98 F (36.7 C) 97.6 F (36.4 C) 97.9 F (36.6 C) 97.3 F (36.3 C)  TempSrc: Oral Oral Oral Oral  SpO2: 100% 100% 100% 100%  Weight:   127 lb 3.2 oz (57.7 kg)   Height:        Intake/Output Summary (Last 24 hours) at 08/20/16 0942 Last data filed at 08/20/16 0925  Gross per 24 hour  Intake              480 ml  Output              620 ml  Net             -140 ml   Filed Weights   08/19/16 1729  08/20/16 0553  Weight: 127 lb 8 oz (57.8 kg) 127 lb 3.2 oz (57.7 kg)    Telemetry    Normal sinus rhythm with first-degree AV block, no arrhythmias - Personally Reviewed  ECG    Normal sinus rhythm with trifascicular block, right bundle branch block, first-degree AV block and left anterior fascicular block - Personally Reviewed  Physical Exam   GEN: No acute distress.   Neck: No JVD Cardiac: RRR, no murmurs, rubs, or gallops.  Respiratory: Clear to auscultation bilaterally. GI: Soft, nontender, non-distended  MS: No edema; No deformity. Neuro:  Nonfocal  Psych: Normal affect   Labs    Chemistry Recent Labs Lab 08/19/16 0957 08/19/16 1746  NA 137  --   K 4.3  --   CL 105  --   CO2 23  --   GLUCOSE 116*  --   BUN 25*  --   CREATININE 1.47* 1.52*  CALCIUM 8.8*  --   GFRNONAA 41* 39*  GFRAA 48* 46*  ANIONGAP 9  --  Hematology Recent Labs Lab 08/19/16 0957 08/19/16 1746  WBC 6.1 6.1  RBC 3.39* 3.40*  HGB 11.9* 11.7*  HCT 35.2* 35.4*  MCV 103.8* 104.1*  MCH 35.1* 34.4*  MCHC 33.8 33.1  RDW 12.9 12.9  PLT 137* 140*    Cardiac Enzymes Recent Labs Lab 08/19/16 1746 08/19/16 2354  TROPONINI 0.03* 0.03*    Recent Labs Lab 08/19/16 1012 08/19/16 1257  TROPIPOC 0.01 0.02     BNPNo results for input(s): BNP, PROBNP in the last 168 hours.   DDimer No results for input(s): DDIMER in the last 168 hours.   Radiology    Dg Chest 2 View  Result Date: 08/19/2016 CLINICAL DATA:  Midline chest pain today, history of hypertension and ischemic cardiomyopathy EXAM: CHEST  2 VIEW COMPARISON:  Chest x-ray of 10/24/2015 FINDINGS: No active infiltrate or effusion is seen. Mediastinal and hilar contours are unremarkable. The lungs remain somewhat hyperaerated. The heart is within upper limits normal. Median sternotomy sutures are noted prior CABG. No acute bony abnormality is seen. IMPRESSION: Hyperaeration. No active lung disease. Stable borderline cardiomegaly.  Electronically Signed   By: Dwyane Dee M.D.   On: 08/19/2016 11:06    Cardiac Studies   None performed  Patient Profile     83 y.o. male admitted with atypical chest pain. He ruled out for myocardial infarction. He is currently asymptomatic. Plan discharge home today  Assessment & Plan    1: Atypical chest pain-Karl Stevenson is a patient of Dr. Earmon Phoenix with a long history of ischemic heart disease status post bypass grafting in 1987 with redo in 2008. He has a history of ischemic cardiomyopathy, hypertension, peripheral arterial disease and COPD as well as ventricular tachycardia on mexiletine. His enzymes were negative. He had no acute EKG changes. He currently is pain-free. He can be discharged home today with follow-up in Dr. Earmon Phoenix office in several weeks.  Alphonsus Sias, MD  08/20/2016, 9:42 AM

## 2016-08-20 NOTE — Progress Notes (Signed)
DC instructions reviewed with patient and son, questions answered, verbalized understanding.  Patient transported via wheelchair to front of hospital to be taken home by son.

## 2016-08-20 NOTE — Consult Note (Signed)
   [facility] CM Inpatient Consult   08/20/2016  Cosimo Schertzer [facility] 09/24/1926 [id number]  Patient here under observation for chest pain and was reported his wife is in [place]. Chart review reveals the patient is an 83 y/o male with a hx of CAD status post CABG (original surgery in 1987, redo in 2008), ischemic cardiomyopathy, chronic systolic CHF - last echo from July 2017 showed his EF had improved back to his baseline (35%), HTN, HLD, PAD, status post L CEA, s/p bif fem BPG, known AAA, mild R renal artery stenosis, CKD stage III andCOPD came to ER 08/19/16 for chest pain.  Patient had been assigned [facility] HF calls by inpatient for post hospital follow up. Met with the patient to assess for community follow up needs.  Patient states he is not having any difficulty taking his medications or affording them.  He states he wife drives him to appointments and no problems with this.  His wife is currently out of town but he states, "I have two sons who are looking after me and taking turns with me until she gets back."  Patient denies any other needs and accepted the post hospital follow up calls with EMMI.  For questions, please contact:  Natividad Brood, RN BSN [facility] Liaison  [phone] business mobile phone Toll free office [phone]

## 2016-08-20 NOTE — Discharge Summary (Signed)
Discharge Summary    Patient ID: Karl Stevenson,  MRN: [id number], DOB/AGE: 1927-05-04 83 y.o.  Admit date: 08/19/2016 Discharge date: 08/20/2016  Primary Care Provider: Garlan Fillers Primary Cardiologist: Dr. Excell Seltzer EP: Dr. Graciela Husbands  Discharge Diagnoses    Principal Problem:   Chest pain with moderate risk of acute coronary syndrome Active Problems:   Hx of CABG '87, '08   HTN (hypertension)   PVD (peripheral vascular disease) (HCC)   Ischemic cardiomyopathy   Chronic systolic heart failure (HCC)   RBBB   CKD (chronic kidney disease), stage III   Allergies Allergies  Allergen Reactions  . Novocain [Procaine Hcl] Other (See Comments)    Cold sweats and very bad headache.   . Procaine Other (See Comments)    Reaction not recalled by the patient  . Metoprolol Hives, Itching and Rash    Diagnostic Studies/Procedures    None  History of Present Illness     83 y/o male with a hx of CAD status post CABG (original surgery in 1987, redo in 2008), ischemic cardiomyopathy, chronic systolic CHF - last echo from July 2017 showed his EF had improved back to his baseline (35%), HTN, HLD, PAD, status post L CEA, s/p bif fem BPG, known AAA, mild R renal artery stenosis, CKD stage III and COPD came to ER 08/19/16 for chest pain.   Lastcardiac catheterization in 10/16 with stable anatomy and no target for PCI. Decision was made to continue medical therapy.   He was admitted in 11/16 with ventricular tachycardia. Patient and his family wanted to avoid device implantation,  therefore, he was treated medically. He was placed on amiodarone, mexiletine and Ranexa. Amiodarone was DC'd 2/2 bradycardia.   He presents to the ED 4/11 with mid sternal chest pain. The pt says he woke up at 3 am and felt "sharp" mid sternal pain that would last "seconds" the go away and come back in a few seconds. He took NTG x 2 without relief. His wife is out of town. He tried to call the office to see if  he could be seen in the office but the answering service put him on hold "for 30 minutes" and he gave up and called 911.  He denies any associated SOB, nausea, or diaphoresis. He continues to complain of "sharp" chest pain. No change with movement, palpation, or deep breathing.   Hospital Course     Consultants: None  The patient was admitted for observation and ruled out. Remained chest pain free. His enzymes were negative. He had no acute EKG changes. At presentation his BP is elevated. Added Amlodipine to regiment with improvement of blood pressures.  Discharged home without intervention. Ambulated well.   The patient has been seen by Dr. Allyson Sabal today and deemed ready for discharge home. All follow-up appointments have been scheduled. Discharge medications are listed below.    Discharge Vitals Blood pressure (!) 129/55, pulse 67, temperature 97.3 F (36.3 C), temperature source Oral, resp. rate 18, height 5\' 7"  (1.702 m), weight 127 lb 3.2 oz (57.7 kg), SpO2 100 %.  Filed Weights   08/19/16 1729 08/20/16 0553  Weight: 127 lb 8 oz (57.8 kg) 127 lb 3.2 oz (57.7 kg)    Labs & Radiologic Studies     CBC  Recent Labs  08/19/16 0957 08/19/16 1746  WBC 6.1 6.1  HGB 11.9* 11.7*  HCT 35.2* 35.4*  MCV 103.8* 104.1*  PLT 137* 140*   Basic Metabolic Panel  Recent  Labs  08/19/16 0957 08/19/16 1746  NA 137  --   K 4.3  --   CL 105  --   CO2 23  --   GLUCOSE 116*  --   BUN 25*  --   CREATININE 1.47* 1.52*  CALCIUM 8.8*  --    Liver Function Tests No results for input(s): AST, ALT, ALKPHOS, BILITOT, PROT, ALBUMIN in the last 72 hours. No results for input(s): LIPASE, AMYLASE in the last 72 hours. Cardiac Enzymes  Recent Labs  08/19/16 1746 08/19/16 2354  TROPONINI 0.03* 0.03*   BNP Invalid input(s): POCBNP D-Dimer No results for input(s): DDIMER in the last 72 hours. Hemoglobin A1C No results for input(s): HGBA1C in the last 72 hours. Fasting Lipid Panel No  results for input(s): CHOL, HDL, LDLCALC, TRIG, CHOLHDL, LDLDIRECT in the last 72 hours. Thyroid Function Tests No results for input(s): TSH, T4TOTAL, T3FREE, THYROIDAB in the last 72 hours.  Invalid input(s): FREET3  Dg Chest 2 View  Result Date: 08/19/2016 CLINICAL DATA:  Midline chest pain today, history of hypertension and ischemic cardiomyopathy EXAM: CHEST  2 VIEW COMPARISON:  Chest x-ray of 10/24/2015 FINDINGS: No active infiltrate or effusion is seen. Mediastinal and hilar contours are unremarkable. The lungs remain somewhat hyperaerated. The heart is within upper limits normal. Median sternotomy sutures are noted prior CABG. No acute bony abnormality is seen. IMPRESSION: Hyperaeration. No active lung disease. Stable borderline cardiomegaly. Electronically Signed   By: Dwyane Dee M.D.   On: 08/19/2016 11:06    Disposition   Pt is being discharged home today in good condition.  Follow-up Plans & Appointments       Discharge Medications   Current Discharge Medication List    CONTINUE these medications which have NOT CHANGED   Details  acetaminophen (TYLENOL) 500 MG tablet Take 1,000 mg by mouth every 6 (six) hours as needed (pain).     aspirin EC 81 MG tablet Take 81 mg by mouth daily.    atorvastatin (LIPITOR) 10 MG tablet Take 10 mg by mouth at bedtime.     carboxymethylcellulose (REFRESH TEARS) 0.5 % SOLN Place 1 drop into both eyes 4 (four) times daily.    clopidogrel (PLAVIX) 75 MG tablet TAKE ONE TABLET BY MOUTH ONCE DAILY Qty: 90 tablet, Refills: 3    ipratropium (ATROVENT) 0.03 % nasal spray Place 1 spray into both nostrils daily.     !! isosorbide mononitrate (IMDUR) 120 MG 24 hr tablet TAKE ONE TABLET BY MOUTH ONCE DAILY Qty: 90 tablet, Refills: 3    !! isosorbide mononitrate (IMDUR) 60 MG 24 hr tablet TAKE ONE TABLET BY MOUTH ONCE DAILY IN ADDITION  TO ISOSORBIDE 120 MG TABLET DAILY Qty: 90 tablet, Refills: 2    Lactase (LACTAID PO) Take 1 tablet by  mouth 2 (two) times daily.    losartan (COZAAR) 25 MG tablet TAKE ONE TABLET BY MOUTH ONCE DAILY Qty: 90 tablet, Refills: 2    mexiletine (MEXITIL) 200 MG capsule TAKE ONE CAPSULE BY MOUTH EVERY 12 HOURS Qty: 60 capsule, Refills: 11    Multiple Vitamin (MULTIVITAMIN WITH MINERALS) TABS tablet Take 2 tablets by mouth daily.    nitroGLYCERIN (NITROSTAT) 0.4 MG SL tablet Place 0.4 mg under the tongue every 5 (five) minutes as needed for chest pain.    OVER THE COUNTER MEDICATION See admin instructions. Barley Life by Aim (ground barley): Mix 1 scoop into 6-8 ounces of juice and drink once a day    pantoprazole (PROTONIX)  40 MG tablet TAKE ONE TABLET BY MOUTH ONCE DAILY Qty: 90 tablet, Refills: 3    polyethylene glycol powder (GLYCOLAX/MIRALAX) powder Take 17 g by mouth at bedtime. Mix in 8 oz liquid and drink    ranolazine (RANEXA) 500 MG 12 hr tablet Take 2 tablets (1,000 mg total) by mouth 2 (two) times daily. Qty: 120 tablet, Refills: 11    vitamin B-12 (CYANOCOBALAMIN) 1000 MCG tablet Take 2,000 mcg by mouth daily.     !! - Potential duplicate medications found. Please discuss with provider.         Outstanding Labs/Studies   None  Duration of Discharge Encounter   Greater than 30 minutes including physician time.  Signed, Bhagat,Bhavinkumar PA-C 08/20/2016, 10:00 AM  Agree with note by Chelsea Aus PA-C  Patient ruled out for myocardial infarction. He is pain-free this morning. Okay for discharge. Follow-up with Dr. Excell Seltzer.  Runell Gess, M.D., FACP, [facility], [facility] [place] [address] [place], [place]  [address]  [phone] 08/20/2016 10:30 AM

## 2016-08-20 NOTE — Progress Notes (Signed)
Patient walked length of hall with mobility tech without difficulty.

## 2016-08-29 ENCOUNTER — Telehealth: Payer: Self-pay | Admitting: Cardiology

## 2016-08-29 NOTE — Telephone Encounter (Signed)
Pt called reporting that his systolic BP has been in the 80s today, asymptomatic. Already took his morning medications, including his antihypertensives. Instructed to hold his Ranexa this evening. Follow up on his blood pressure. If systolic remains <161 tomorrow instructed to hold his blood pressure medications and give a return follow up phone call. He was agreeable to this and thanked me for the follow up call.   Laverda Page NP-c

## 2016-08-31 ENCOUNTER — Other Ambulatory Visit: Payer: Self-pay

## 2016-08-31 NOTE — Patient Outreach (Signed)
[facility]) Care Management  08/31/2016  Mivaan Stills [facility] Jan 07, 1927 [id number]  Nurse call line Nurse call line date; 08/29/16 REFERRAL REASON; Blood pressure low - systolic 90 or less with dizzy or weak symptoms.  Nurse call line recommendation: call 911/ emergency room visit [facility] care management referral date: 08/31/16  SUBJECTIVE:  Telephone call to patient regarding nurse call line referral.  RNCM discussed with patient call was in response to him calling the nurse call line. Patient states he called the on call doctor on Saturday 08/29/16. Patient states the on call doctor told him not to take his blood pressure medication the next day, Sunday 08/30/16 if his blood pressure was below 100. Patient states his blood pressure was in the 140's top number but unsure of bottom number. Patient states he had a follow up appointment with his primary MD today.  Patient reports his doctor instructed him to discontinue the Norvasc and follow up with his cardiologist to re-address. Patient states he has an appointment with his cardiologist on 09/16/15.  Patient states he has not had anymore symptoms since he discontinued the blood pressure medicine. Patient reports his last dose of the norvasc was Saturday 08/29/16. Patient denies any further needs at this time.  Patient advised to take medication as prescribed. Patient given 24 hour nurse advise line number. Patient instructed to call 911 for severe/ emergent symptoms.  Patient advised to follow up with doctor for other symptoms/ concerns.  PLAN: RNCM will refer patient to care management assistant to close due to patient being assessed and having no further needs. RNCM will notify patients doctor of closure reason.   George Ina RN,BSN,CCM [facility] Telephonic  [phone]

## 2016-09-15 ENCOUNTER — Ambulatory Visit (INDEPENDENT_AMBULATORY_CARE_PROVIDER_SITE_OTHER): Payer: Medicare PPO | Admitting: Nurse Practitioner

## 2016-09-15 ENCOUNTER — Encounter: Payer: Self-pay | Admitting: Nurse Practitioner

## 2016-09-15 VITALS — BP 122/60 | HR 86 | Ht 67.0 in | Wt 134.0 lb

## 2016-09-15 DIAGNOSIS — E7800 Pure hypercholesterolemia, unspecified: Secondary | ICD-10-CM

## 2016-09-15 DIAGNOSIS — I5022 Chronic systolic (congestive) heart failure: Secondary | ICD-10-CM | POA: Diagnosis not present

## 2016-09-15 DIAGNOSIS — I255 Ischemic cardiomyopathy: Secondary | ICD-10-CM | POA: Diagnosis not present

## 2016-09-15 DIAGNOSIS — I2510 Atherosclerotic heart disease of native coronary artery without angina pectoris: Secondary | ICD-10-CM

## 2016-09-15 NOTE — Patient Instructions (Addendum)
We will be checking the following labs today - NONE   Medication Instructions:    Continue with your current medicines.     Testing/Procedures To Be Arranged:  N/A  Follow-Up:   See Dr. Graciela Husbands in July - Wednesday 12/02/16 2 11:45 am  I will see you in October - Wednesday 02/17/17 @ 11:00 am    Other Special Instructions:   N/A    If you need a refill on your cardiac medications before your next appointment, please call your pharmacy.   Call the [facility] office at [phone] if you have any questions, problems or concerns.

## 2016-09-15 NOTE — Progress Notes (Signed)
CARDIOLOGY OFFICE NOTE  Date:  09/15/2016     Karl Stevenson Date of Birth: 15-Nov-1926 Medical Record #[id number]  PCP:  Jarome Matin, MD  Cardiologist:  Kirt Boys  Chief Complaint  Patient presents with  . Coronary Artery Disease    Follow up visit - seen for Dr. Excell Seltzer    History of Present Illness: Karl Stevenson is a 83 y.o. male who presents today for a follow up visit. Seen for Dr. Verlon Au.   He has a hx of CAD status post CABG (original surgery in 1987, redo in 2008), ischemic cardiomyopathy, systolic CHF - last echo from July 2017 showed his EF had improved back to his baseline (35%), HTN, PAD, carotid stenosis status post L CEA, AAA, mild R renal artery stenosis, COPD. Admitted in 11/16 with ventricular tachycardia. Patient and his family wanted to avoid device implantation. Therefore, he was treated medically. He was placed on amiodarone, mexiletine and Ranexa. Amiodarone was DC'd 2/2 bradycardia.   Admitted March 2017 with chest pain with questionable loss of consciousness. He had minimal elevation in troponin without clear trend. This was not felt to represent ACS. Lastcardiac catheterization in 10/16 with stable anatomy and no target for PCI. Decision was made to continue medical therapy. His nitrate dose was increased.  Saw Dr. Excell Seltzer in June of 2017 had just been discharged after a syncopal spell in the setting of GI illness.   I  saw him back in October - getting frail but cardiac status seemed ok. Had had a fall and needed stitches to his head. Last visit with me was back in February - he had stopped driving. No angina. More sedentary. Overall, cardiac status seemed stable.    Admitted last month with chest pain. He woke up at 3 am and felt "sharp" mid sternal pain that would last "seconds" the go away and come back in a few seconds. He took NTG x 2 without relief. His wife was out of town. He tried to call the office to see if he could  be seen in the office but the answering service put him on hold "for 30 minutes" and he gave up and called 911. Subsequently admitted. BP elevated and Norvasc was added back to his regimen. Ruled out for MI.   Comes in today. Here alone. Using his cane. He says he is doing better. BP down right after discharge - saw his PCP - Norvasc changed to only take if BP over 130. His multiple readings are reviewed - for the most part they are ok. No more chest pain. No syncope. He did have some fast heart rates notes - especially right after this last discharge and in the setting of the lower BP's. I would suspect the high HR is what drove the BP down. He is happy with how he is doing. No more falls but unsteady.   Past Medical History:  Diagnosis Date  . AAA (abdominal aortic aneurysm) (HCC)    a. Ectatic abdominal aorta with fusiform dilitation 3.4x3.4 and moderate thrombus burden 12/2010  . Bradycardia    a. previous intolerance to beta blockers - low dose toprol started 10/2011 for tachycardia but had rash and was discontinued;  b. tolerating low dose propranolol.  . Carotid artery occlusion    a. s/p L CEA in 2010;  b. 06/2014 Carotid U/S: stable LICA CEA site w/o significant stenosis bilat.  . Chronic systolic heart failure (HCC)    a. 02/2015 Echo:  35-40% (echo 11/2010); c. 02/2015 Echo: EF 20-25%, diff HK, mid-apicalanteroseptal and apical AK, Gr 1 DD, mild MR.  [name] COPD (chronic obstructive pulmonary disease) (HCC)   . Coronary artery disease    a. 1969 s/p MI;  b. S/P CABG 1987;  c. S/P redo 2008;  d. 07/2012 Cath: stable->Med Rx, EF 40%; e. 02/2014 MV: anterior and posterolateral/apical infarct with mild peri-infarct ischemia->Med Rx; f. 02/2015 Cath: LM 100d, LAD 100ost, LCX 100ost, OM2 100, VG->OM3->OM4 nl, VG-> LAD nl (Y from VG->OM), VG->OM2 100p.  . Crohn's disease (HCC)    a. s/p colon resection  . Gout   . Hyperlipidemia   . Hypertension   . Ischemic cardiomyopathy    a. EF 34% (Lexiscan  06/2011); b. 35-40% (echo 11/2010); c. 02/2015 Echo: EF 20-25%, diff HK, mid-apicalanteroseptal and apical AK, Gr 1 DD, mild MR.  . Peripheral vascular disease (HCC)    a. Carotid dz s/p L CEA, RAS, AAA, LE dz  . RBBB   . Renal artery stenosis (HCC)    a. Dopplers 12/2010 - 1-59% R ostial renal artery stenosis, 2 L renal arteries both widely patent  . Syncope   . Tachycardia    a. 10/2011 - atrial tach vs avnrt - BB started.  . Ventricular tachycardia (HCC)    a. 02/2015 ->amio 200 daily added-->later switched to mexilitene.    Past Surgical History:  Procedure Laterality Date  . ABDOMINAL AORTAGRAM N/A 06/11/2011   Procedure: ABDOMINAL [name];  Surgeon: [name], MD;  Location: [facility] CATH LAB;  Service: Cardiovascular;  Laterality: N/A;  . CARDIAC CATHETERIZATION  07/28/2012   Native 3v CAD, continued patency of the SVG-OM1-LAD and SVG-OM2-left PDA. LVEF 40% with multiple WMAs  . CARDIAC CATHETERIZATION N/A 03/08/2015   Procedure: Left Heart Cath and Coronary Angiography;  Surgeon: [name], MD;  Location: [facility] INVASIVE CV LAB;  Service: Cardiovascular;  Laterality: N/A;  . CAROTID ENDARTERECTOMY Left 2010  . CATARACT EXTRACTION, BILATERAL    . COLON RESECTION  2008  . COLON SURGERY    . CORONARY ANGIOPLASTY    . CORONARY ARTERY BYPASS GRAFT  10/31/85 & 08/19/06  . ENDARTERECTOMY  08/26/2011   Procedure: ENDARTERECTOMY ILIAC;  Surgeon: [name], MD;  Location: [facility] OR;  Service: Vascular;  Laterality: Right;  Right Femoral Artery Endarterectomy with vascu guard patch angioplasty & intraoperative arteriogram.  . EYE SURGERY    . FEMORAL ENDARTERECTOMY Left  06/2011   ileofemoral endarterectomy with bovine patch angioplasty  . LEFT HEART CATHETERIZATION WITH CORONARY ANGIOGRAM N/A 07/28/2012   Procedure: LEFT HEART CATHETERIZATION WITH CORONARY ANGIOGRAM;  Surgeon: [name], MD;  Location: [facility] CATH LAB;  Service: Cardiovascular;  Laterality: N/A;  . PR VEIN BYPASS  GRAFT,AORTO-FEM-POP  10/1985  . TONSILLECTOMY     as a child     Medications: Current Outpatient Prescriptions  Medication Sig Dispense Refill  . acetaminophen (TYLENOL) 500 MG tablet Take 1,000 mg by mouth every 6 (six) hours as needed (pain).     [name] amLODipine (NORVASC) 2.5 MG tablet Take 1 tablet (2.5 mg total) by mouth daily. 30 tablet 6  . aspirin EC 81 MG tablet Take 81 mg by mouth daily.    [name] atorvastatin (LIPITOR) 10 MG tablet Take 10 mg by mouth at bedtime.     . carboxymethylcellulose (REFRESH TEARS) 0.5 % SOLN Place 1 drop into both eyes 4 (four) times daily.    . clopidogrel (PLAVIX) 75 MG tablet TAKE ONE TABLET  BY MOUTH ONCE DAILY 90 tablet 3  . ipratropium (ATROVENT) 0.03 % nasal spray Place 1 spray into both nostrils daily.     . isosorbide mononitrate (IMDUR) 120 MG 24 hr tablet TAKE ONE TABLET BY MOUTH ONCE DAILY (Patient taking differently: Take 120 mg by mouth once a day IN CONJUNCTION WITH ONE 60 MG TABLET TO EQUAL A TOTAL DOSE OF 180 MILLIGRAMS) 90 tablet 3  . isosorbide mononitrate (IMDUR) 60 MG 24 hr tablet TAKE ONE TABLET BY MOUTH ONCE DAILY IN ADDITION  TO ISOSORBIDE 120 MG TABLET DAILY (Patient taking differently: Take 60 mg by mouth once a day IN CONJUNCTION WITH ONE 120 MG TABLET TO EQUAL A TOTAL DOSE OF 180 MILLIGRAMS) 90 tablet 2  . Lactase (LACTAID PO) Take 1 tablet by mouth 2 (two) times daily.    [name] losartan (COZAAR) 25 MG tablet TAKE ONE TABLET BY MOUTH ONCE DAILY 90 tablet 2  . mexiletine (MEXITIL) 200 MG capsule TAKE ONE CAPSULE BY MOUTH EVERY 12 HOURS 60 capsule 11  . Multiple Vitamin (MULTIVITAMIN WITH MINERALS) TABS tablet Take 2 tablets by mouth daily.    . nitroGLYCERIN (NITROSTAT) 0.4 MG SL tablet Place 0.4 mg under the tongue every 5 (five) minutes as needed for chest pain.    [name] OVER THE COUNTER MEDICATION See admin instructions. Barley Life by Aim (ground barley): Mix 1 scoop into 6-8 ounces of juice and drink once a day    . pantoprazole (PROTONIX) 40  MG tablet TAKE ONE TABLET BY MOUTH ONCE DAILY 90 tablet 3  . polyethylene glycol powder (GLYCOLAX/MIRALAX) powder Take 17 g by mouth at bedtime. Mix in 8 oz liquid and drink    . ranolazine (RANEXA) 500 MG 12 hr tablet Take 2 tablets (1,000 mg total) by mouth 2 (two) times daily. 120 tablet 11  . vitamin B-12 (CYANOCOBALAMIN) 1000 MCG tablet Take 2,000 mcg by mouth daily.     No current facility-administered medications for this visit.     Allergies: Allergies  Allergen Reactions  . Novocain [Procaine Hcl] Other (See Comments)    Cold sweats and very bad headache.   . Procaine Other (See Comments)    Reaction not recalled by the patient  . Metoprolol Hives, Itching and Rash    Social History: The patient  reports that he has never smoked. He has never used smokeless tobacco. He reports that he does not drink alcohol or use drugs.   Family History: The patient's family history includes CAD in his brother; COPD in his brother and mother; Cancer in his mother; Deep vein thrombosis in his brother; Heart disease in his father.   Review of Systems: Please see the history of present illness.   Otherwise, the review of systems is positive for none.   All other systems are reviewed and negative.   Physical Exam: VS:  BP 122/60   Pulse 86   Ht 5\' 7"  (1.702 m)   Wt 134 lb (60.8 kg)   SpO2 98%   BMI 20.99 kg/m  .  BMI Body mass index is 20.99 kg/m.  Wt Readings from Last 3 Encounters:  09/15/16 134 lb (60.8 kg)  08/20/16 127 lb 3.2 oz (57.7 kg)  07/08/16 131 lb (59.4 kg)    General: Elderly male. He is alert and in no acute distress. Getting more frail.   HEENT: Normal.  Neck: Supple, no JVD, carotid bruits, or masses noted.  Cardiac: Regular rate and rhythm. No murmurs, rubs, or gallops. No  edema.  Respiratory:  Lungs are clear to auscultation bilaterally with normal work of breathing.  GI: Soft and nontender.  MS: No deformity or atrophy. Gait and ROM intact. Using a cane.    Skin: Warm and dry. Color is normal.  Neuro:  Strength and sensation are intact and no gross focal deficits noted.  Psych: Alert, appropriate and with normal affect.   LABORATORY DATA:  EKG:  EKG is not ordered today.  Lab Results  Component Value Date   WBC 6.1 08/19/2016   HGB 11.7 (L) 08/19/2016   HCT 35.4 (L) 08/19/2016   PLT 140 (L) 08/19/2016   GLUCOSE 116 (H) 08/19/2016   CHOL 122 (L) 03/03/2016   TRIG 174 (H) 03/03/2016   HDL 39 (L) 03/03/2016   LDLCALC 48 03/03/2016   ALT 11 03/03/2016   AST 16 03/03/2016   NA 137 08/19/2016   K 4.3 08/19/2016   CL 105 08/19/2016   CREATININE 1.52 (H) 08/19/2016   BUN 25 (H) 08/19/2016   CO2 23 08/19/2016   TSH 2.053 10/24/2015   INR 1.20 07/27/2015    BNP (last 3 results) No results for input(s): BNP in the last 8760 hours.  ProBNP (last 3 results) No results for input(s): PROBNP in the last 8760 hours.   Other Studies Reviewed Today:  Echo Study Conclusions from 11/2015  - Left ventricle: The cavity size was normal. Systolic function was moderately to severely reduced. The estimated ejection fraction was in the range of 30% to 35%. There is akinesis of the apicalanterior, lateral, inferolateral, inferior, and apical myocardium. There is akinesis of the midanteroseptal myocardium. There was an increased relative contribution of atrial contraction to ventricular filling. Doppler parameters are consistent with abnormal left ventricular relaxation (grade 1 diastolic dysfunction). - Aortic valve: Trileaflet; normal thickness, mildly calcified leaflets. - Mitral valve: Calcified annulus. There was mild regurgitation.    Cath 03-08-2015:    Conclusion    Sequential SVG-OM2-OM3 was injected is large, and is anatomically normal.  Y Graft SVG-LAD from SVG-OM is large, and is anatomically normal. The downstream LAD is small caliber and diffusely diseased.  LM lesion, 100% stenosed.  Ost  LAD to Prox LAD lesion, 100% stenosed.  Ost 2nd Mrg to 2nd Mrg lesion, 100% stenosed.  SVG was injected is small. JR4 Catheter  Prox Original SVG-OM1 100% stenosed.  Ost Cx to Mid Cx lesion, 100% stenosed.  Borderline LOW LVEDP   Impression:  Known severe native coronary disease. Disabled contrast the native arteries were not evaluated.  Occluded old SVG-OM1 - the dye remained standing in the proximal vessel for several minutes following angiography - not PCI amenable ? This is the likely culprit for the patient's episode of what sounds like anginal symptoms about a month ago. This may also be a cause of now worsening ventricular tachycardia.   Widely patent Y graft with one limb providing sequential grafting to OM1 and OM 2 as well as a separate limb serving as a graft to the LAD which retrograde fills the diagonal branch. This redo graft now provides the entirety of the patient's coronary circulation.  The graft obtuse marginal branches are relatively free of disease with retrograde filling back to the distal circumflex system which consists of the posterolateral branch and left PDA.  The grafted LAD itself was a very diffusely diseased vessel, but no change from previous.  Known small nondominant right coronary artery system.  Borderline low LVEDP   Recommendations:  No PCI targets.  Standard post radial cath care with TR band removal  Would proceed with ICD placement per EP    ASSESSMENT AND PLAN:  1. CAD - managed medically. No targets for PCI.  I have left him on his current regimen. Recent admission for chest pain with negative troponins. Chest pain has resolved.   2. Past syncope: lastevent was related to volume depletion. No recurrence  2. Chronic systolic heart failure - looks compensated. He has not been able to tolerate beta blockers because of bradycardic events. Last echo showed that his EF had improved to his baseline at 35%. No change with  current regimen for now. Would continue for now.   3. Paroxysmal ventricular tachycardia: Followed by Dr. Graciela Husbands. Treated with mexiletine and Ranexa. Has declined device implant. He has had some fast HR notes but more recently this looks stable. He will continue to monitor. No syncope.   4. Hypertension:  He does a good job of monitoring. He is now taking Norvasc if systolic is above 130. He is pretty frail at this point. Continue current medicines.  5. Hyperlipidemia: Treated with atorvastatin. Labs by PCP recently noted.   6. Chronic kidney disease stage III:   7. Advancing age - will continue with conservative management. Wife notes he is sleeping more and less active.    Current medicines are reviewed with the patient today.  The patient does not have concerns regarding medicines other than what has been noted above.  The following changes have been made:  See above.  Labs/ tests ordered today include:   No orders of the defined types were placed in this encounter.    Disposition:   FU with me in October. See Dr. Graciela Husbands in June/July.    Patient is agreeable to this plan and will call if any problems develop in the interim.   SignedNorma Fredrickson, NP  09/15/2016 11:11 AM  [facility] [address] [place], [place]  [address] Phone: [phone] Fax: [phone]

## 2016-09-18 DIAGNOSIS — I2510 Atherosclerotic heart disease of native coronary artery without angina pectoris: Secondary | ICD-10-CM | POA: Diagnosis not present

## 2016-09-18 DIAGNOSIS — E784 Other hyperlipidemia: Secondary | ICD-10-CM | POA: Diagnosis not present

## 2016-09-18 DIAGNOSIS — N183 Chronic kidney disease, stage 3 (moderate): Secondary | ICD-10-CM | POA: Diagnosis not present

## 2016-09-18 DIAGNOSIS — I255 Ischemic cardiomyopathy: Secondary | ICD-10-CM | POA: Diagnosis not present

## 2016-09-18 DIAGNOSIS — I249 Acute ischemic heart disease, unspecified: Secondary | ICD-10-CM | POA: Diagnosis not present

## 2016-09-18 DIAGNOSIS — I10 Essential (primary) hypertension: Secondary | ICD-10-CM | POA: Diagnosis not present

## 2016-09-25 ENCOUNTER — Other Ambulatory Visit: Payer: Self-pay | Admitting: *Deleted

## 2016-09-25 NOTE — Patient Outreach (Signed)
[facility]) Care Management  09/25/2016  Karl Stevenson Karl Stevenson [facility] [birth year] [id number]  Referral via EMMI-Heart Failure: Telephone call to patient who advised that he was not having any heart failure problems. States he is weighing daily and recording. States he had not had any weight gain & knows when to notify his MD with weight gain issues.  States he attends MD appointments & is taking medications as prescribed by his doctors.   Voices that he has no concerns & is requesting to have EMMI calls discontinued.   Plan: Send to care management assistant to close out & stop EMMI Heart Failure calls.  Colleen Can, RN BSN CCM Care Management Coordinator [facility] Care Management  [phone]

## 2016-10-20 DIAGNOSIS — H18413 Arcus senilis, bilateral: Secondary | ICD-10-CM | POA: Diagnosis not present

## 2016-10-20 DIAGNOSIS — Z961 Presence of intraocular lens: Secondary | ICD-10-CM | POA: Diagnosis not present

## 2016-10-20 DIAGNOSIS — H02839 Dermatochalasis of unspecified eye, unspecified eyelid: Secondary | ICD-10-CM | POA: Diagnosis not present

## 2016-10-26 IMAGING — CR DG CHEST 2V
2 series · 2 of 2 positions shown · non-contrast
Comparison: 07/26/2015

CLINICAL DATA: Syncope.  Weakness.

EXAM:
CHEST  2 VIEW

[chest lat]
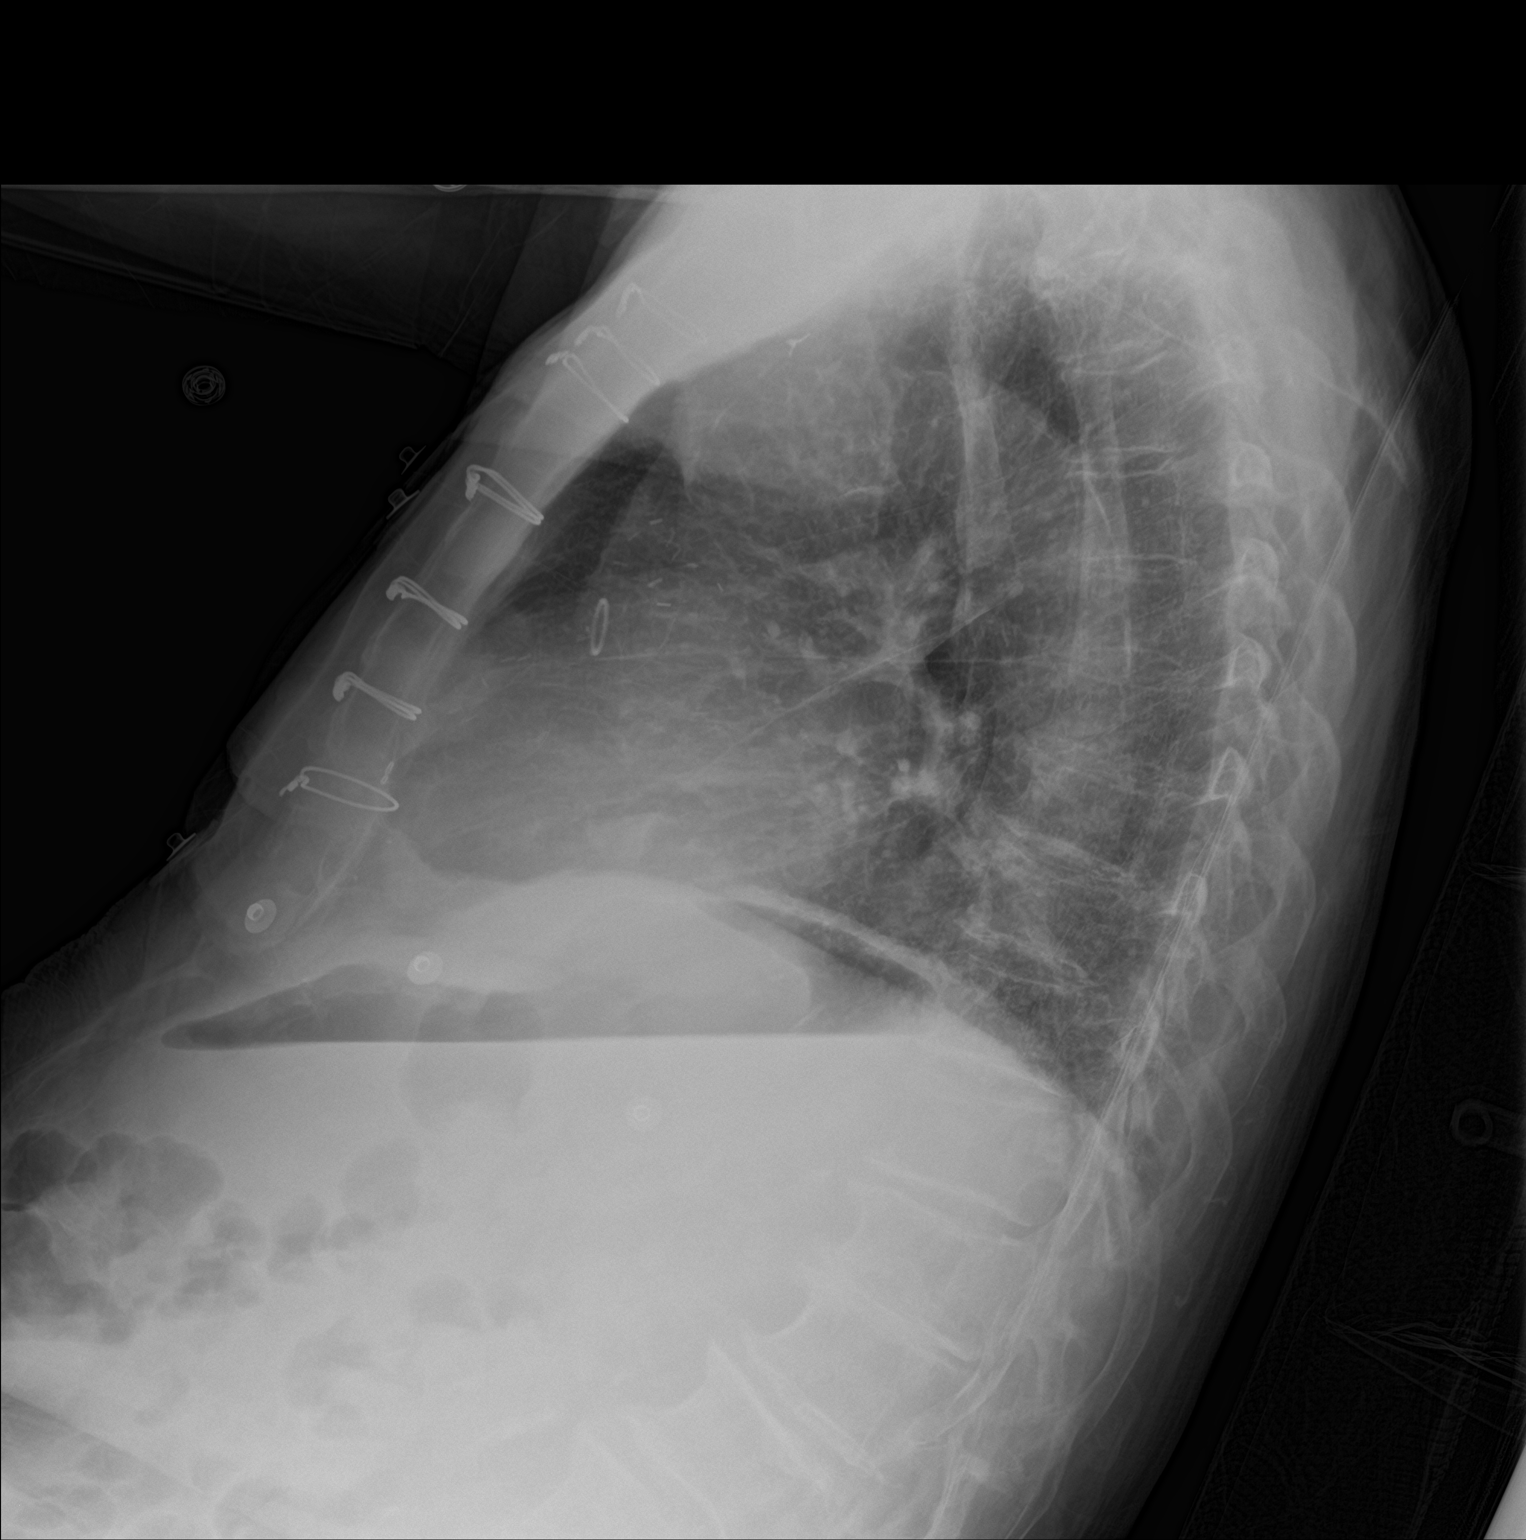

[chest ap]
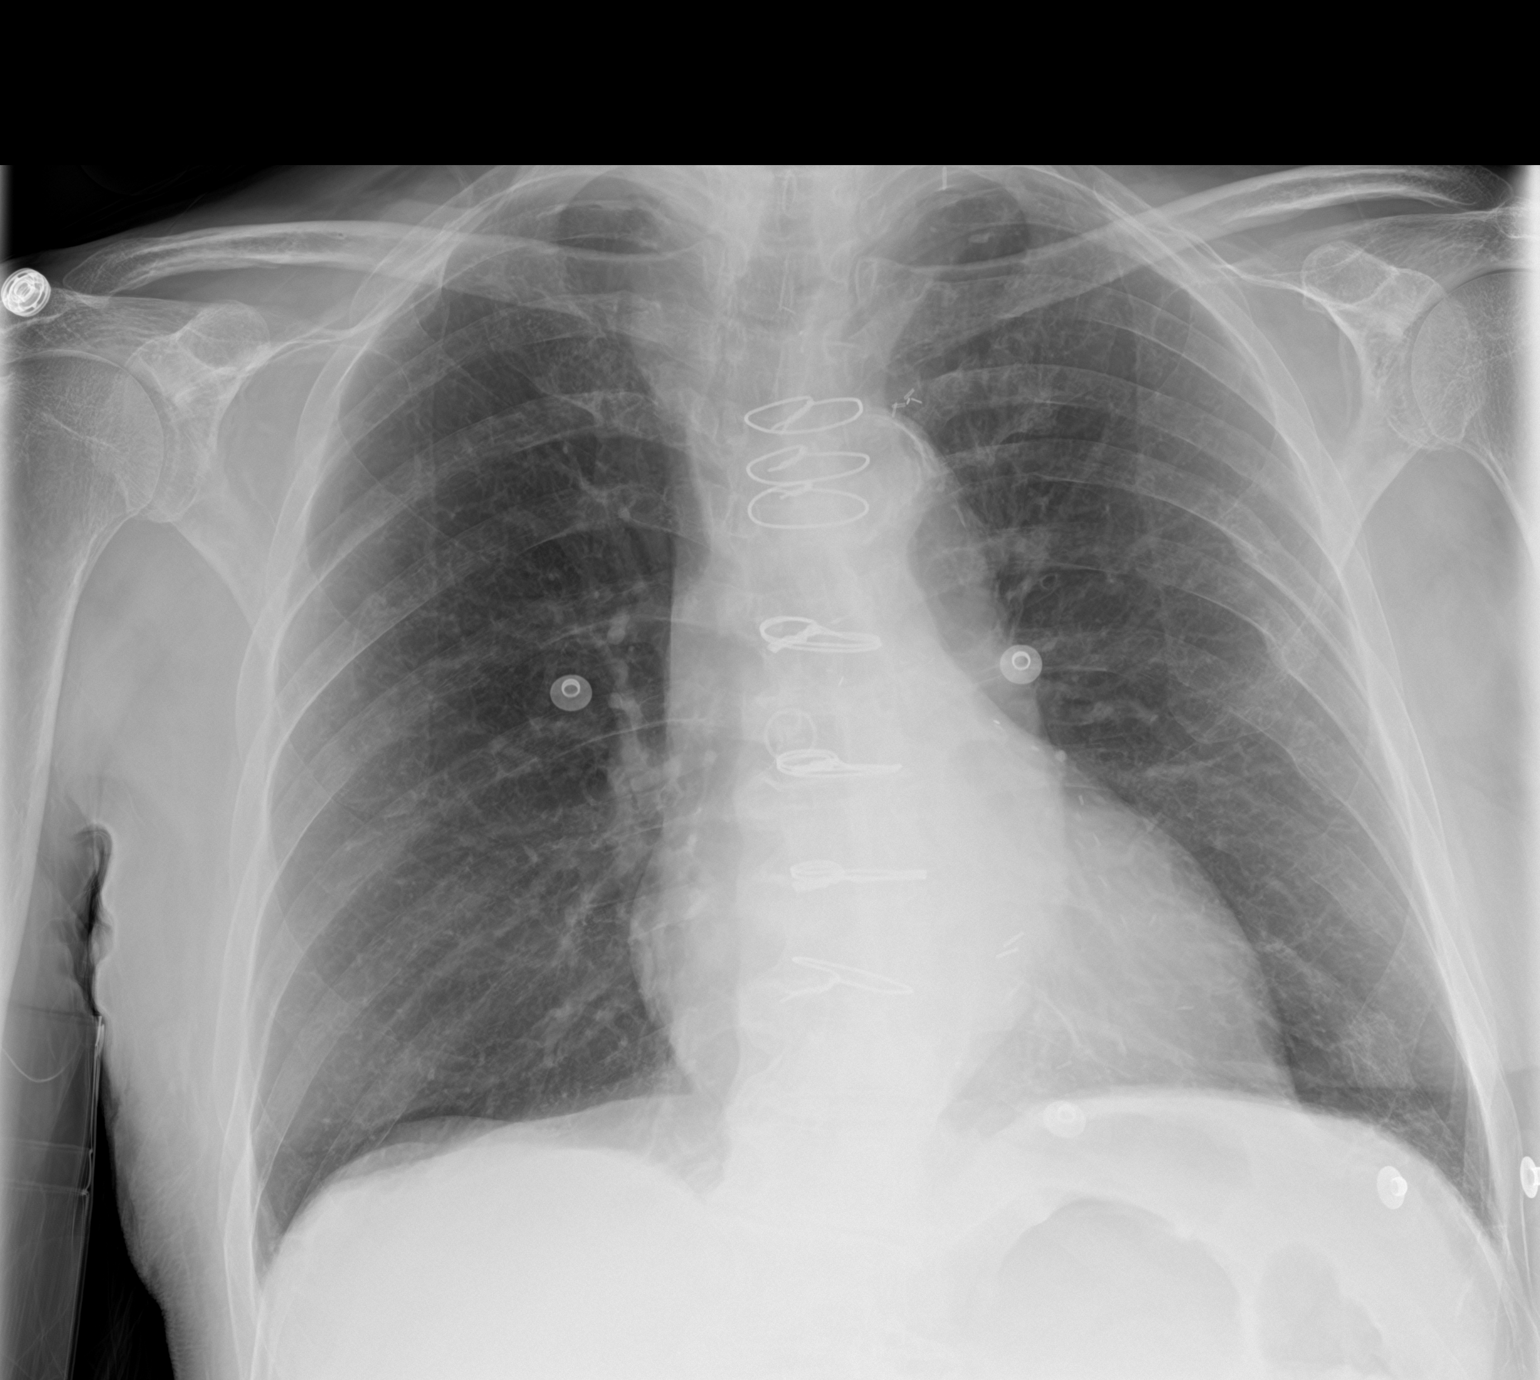

[2 of 2 positions shown; findings below may reference images not displayed]

FINDINGS: There are stable changes from previous CABG surgery. Cardiac
silhouette top-normal in size. The aorta is uncoiled.

No mediastinal or hilar masses or evidence of adenopathy.

Clear lungs.  No pleural effusion or pneumothorax.

Bony thorax is demineralized but grossly intact.
IMPRESSION: No acute cardiopulmonary disease.

## 2016-11-09 ENCOUNTER — Other Ambulatory Visit: Payer: Self-pay | Admitting: Nurse Practitioner

## 2016-12-02 ENCOUNTER — Ambulatory Visit (INDEPENDENT_AMBULATORY_CARE_PROVIDER_SITE_OTHER): Payer: Medicare PPO | Admitting: Internal Medicine

## 2016-12-02 ENCOUNTER — Encounter: Payer: Self-pay | Admitting: Internal Medicine

## 2016-12-02 VITALS — BP 128/70 | HR 80 | Ht 67.0 in | Wt 136.0 lb

## 2016-12-02 DIAGNOSIS — I472 Ventricular tachycardia, unspecified: Secondary | ICD-10-CM

## 2016-12-02 DIAGNOSIS — I259 Chronic ischemic heart disease, unspecified: Secondary | ICD-10-CM

## 2016-12-02 NOTE — Patient Instructions (Signed)
Medication Instructions:  Your physician recommends that you continue on your current medications as directed. Please refer to the Current Medication list given to you today.   Labwork: None Ordered   Testing/Procedures: None Ordered   Follow-Up: Your physician wants you to follow-up in: 6 months with Gypsy Balsam, NP.  You will receive a reminder letter in the mail two months in advance. If you don't receive a letter, please call our office to schedule the follow-up appointment.   If you need a refill on your cardiac medications before your next appointment, please call your pharmacy.   Thank you for choosing [facility]! Eligha Bridegroom, RN [phone]

## 2016-12-02 NOTE — Progress Notes (Signed)
Patient Care Team: Jarome Matin, MD as PCP - General (Internal Medicine) [name], MD (Cardiology)   HPI  Karl Stevenson is a 83 y.o. male Seen in follow-up for ventricular tachycardia in the setting of ischemic heart disease prior bypass surgery modest depression of LV function most recently 35-40%.  He's been managed with a combination of mexiletine and amiodarone and ranolazine.  Intercurrently the amiodarone has been discontinued  He continues to have episodes of nausea. When he checks his vital signs, notably his heart rate is normal. He brings in a record of vital signs.   Hypotension is a frequent concomitant of tachycardia. This can be associated with palpitations as well  Records and Results Reviewed hospital records  Past Medical History:  Diagnosis Date  . AAA (abdominal aortic aneurysm) (HCC)    a. Ectatic abdominal aorta with fusiform dilitation 3.4x3.4 and moderate thrombus burden 12/2010  . Bradycardia    a. previous intolerance to beta blockers - low dose toprol started 10/2011 for tachycardia but had rash and was discontinued;  b. tolerating low dose propranolol.  . Carotid artery occlusion    a. s/p L CEA in 2010;  b. 06/2014 Carotid U/S: stable LICA CEA site w/o significant stenosis bilat.  . Chronic systolic heart failure (HCC)    a. 02/2015 Echo:  35-40% (echo 11/2010); c. 02/2015 Echo: EF 20-25%, diff HK, mid-apicalanteroseptal and apical AK, Gr 1 DD, mild MR.  [name] COPD (chronic obstructive pulmonary disease) (HCC)   . Coronary artery disease    a. 1969 s/p MI;  b. S/P CABG 1987;  c. S/P redo 2008;  d. 07/2012 Cath: stable->Med Rx, EF 40%; e. 02/2014 MV: anterior and posterolateral/apical infarct with mild peri-infarct ischemia->Med Rx; f. 02/2015 Cath: LM 100d, LAD 100ost, LCX 100ost, OM2 100, VG->OM3->OM4 nl, VG-> LAD nl (Y from VG->OM), VG->OM2 100p.  . Crohn's disease (HCC)    a. s/p colon resection  . Gout   . Hyperlipidemia   .  Hypertension   . Ischemic cardiomyopathy    a. EF 34% (Lexiscan 06/2011); b. 35-40% (echo 11/2010); c. 02/2015 Echo: EF 20-25%, diff HK, mid-apicalanteroseptal and apical AK, Gr 1 DD, mild MR.  . Peripheral vascular disease (HCC)    a. Carotid dz s/p L CEA, RAS, AAA, LE dz  . RBBB   . Renal artery stenosis (HCC)    a. Dopplers 12/2010 - 1-59% R ostial renal artery stenosis, 2 L renal arteries both widely patent  . Syncope   . Tachycardia    a. 10/2011 - atrial tach vs avnrt - BB started.  . Ventricular tachycardia (HCC)    a. 02/2015 ->amio 200 daily added-->later switched to mexilitene.    Past Surgical History:  Procedure Laterality Date  . ABDOMINAL AORTAGRAM N/A 06/11/2011   Procedure: ABDOMINAL [name];  Surgeon: [name], MD;  Location: [facility] CATH LAB;  Service: Cardiovascular;  Laterality: N/A;  . CARDIAC CATHETERIZATION  07/28/2012   Native 3v CAD, continued patency of the SVG-OM1-LAD and SVG-OM2-left PDA. LVEF 40% with multiple WMAs  . CARDIAC CATHETERIZATION N/A 03/08/2015   Procedure: Left Heart Cath and Coronary Angiography;  Surgeon: [name], MD;  Location: [facility] INVASIVE CV LAB;  Service: Cardiovascular;  Laterality: N/A;  . CAROTID ENDARTERECTOMY Left 2010  . CATARACT EXTRACTION, BILATERAL    . COLON RESECTION  2008  . COLON SURGERY    . CORONARY ANGIOPLASTY    . CORONARY ARTERY BYPASS GRAFT  10/31/85 &  08/19/06  . ENDARTERECTOMY  08/26/2011   Procedure: ENDARTERECTOMY ILIAC;  Surgeon: [name], MD;  Location: [facility] OR;  Service: Vascular;  Laterality: Right;  Right Femoral Artery Endarterectomy with vascu guard patch angioplasty & intraoperative arteriogram.  . EYE SURGERY    . FEMORAL ENDARTERECTOMY Left  06/2011   ileofemoral endarterectomy with bovine patch angioplasty  . LEFT HEART CATHETERIZATION WITH CORONARY ANGIOGRAM N/A 07/28/2012   Procedure: LEFT HEART CATHETERIZATION WITH CORONARY ANGIOGRAM;  Surgeon: [name], MD;  Location: [facility] CATH LAB;  Service:  Cardiovascular;  Laterality: N/A;  . PR VEIN BYPASS GRAFT,AORTO-FEM-POP  10/1985  . TONSILLECTOMY     as a child    Current Outpatient Prescriptions  Medication Sig Dispense Refill  . acetaminophen (TYLENOL) 500 MG tablet Take 1,000 mg by mouth every 6 (six) hours as needed (pain).     [name] amLODipine (NORVASC) 2.5 MG tablet Take 1 tablet (2.5 mg total) by mouth daily. 30 tablet 6  . aspirin EC 81 MG tablet Take 81 mg by mouth daily.    [name] atorvastatin (LIPITOR) 10 MG tablet Take 10 mg by mouth at bedtime.     . carboxymethylcellulose (REFRESH TEARS) 0.5 % SOLN Place 1 drop into both eyes 4 (four) times daily.    . clopidogrel (PLAVIX) 75 MG tablet TAKE ONE TABLET BY MOUTH ONCE DAILY 90 tablet 3  . ipratropium (ATROVENT) 0.03 % nasal spray Place 1 spray into both nostrils daily.     . isosorbide mononitrate (IMDUR) 120 MG 24 hr tablet TAKE ONE TABLET BY MOUTH ONCE DAILY (Patient taking differently: Take 120 mg by mouth once a day IN CONJUNCTION WITH ONE 60 MG TABLET TO EQUAL A TOTAL DOSE OF 180 MILLIGRAMS) 90 tablet 3  . isosorbide mononitrate (IMDUR) 60 MG 24 hr tablet TAKE ONE TABLET BY MOUTH ONCE DAILY IN ADDITION TO ISOSORBIDE 120 MG TABLET DAILY 90 tablet 0  . Lactase (LACTAID PO) Take 1 tablet by mouth 2 (two) times daily.    [name] losartan (COZAAR) 25 MG tablet TAKE ONE TABLET BY MOUTH ONCE DAILY 90 tablet 2  . mexiletine (MEXITIL) 200 MG capsule TAKE ONE CAPSULE BY MOUTH EVERY 12 HOURS 60 capsule 11  . Multiple Vitamin (MULTIVITAMIN WITH MINERALS) TABS tablet Take 2 tablets by mouth daily.    . nitroGLYCERIN (NITROSTAT) 0.4 MG SL tablet Place 0.4 mg under the tongue every 5 (five) minutes as needed for chest pain.    [name] OVER THE COUNTER MEDICATION See admin instructions. Barley Life by Aim (ground barley): Mix 1 scoop into 6-8 ounces of juice and drink once a day    . pantoprazole (PROTONIX) 40 MG tablet TAKE ONE TABLET BY MOUTH ONCE DAILY 90 tablet 3  . polyethylene glycol powder  (GLYCOLAX/MIRALAX) powder Take 17 g by mouth at bedtime. Mix in 8 oz liquid and drink    . ranolazine (RANEXA) 500 MG 12 hr tablet Take 2 tablets (1,000 mg total) by mouth 2 (two) times daily. 120 tablet 11  . vitamin B-12 (CYANOCOBALAMIN) 1000 MCG tablet Take 2,000 mcg by mouth daily.     No current facility-administered medications for this visit.     Allergies  Allergen Reactions  . Novocain [Procaine Hcl] Other (See Comments)    Cold sweats and very bad headache.   . Procaine Other (See Comments)    Reaction not recalled by the patient  . Metoprolol Hives, Itching and Rash      Review of Systems negative except from  HPI and PMH  Physical Exam BP 128/70   Pulse 80   Ht 5\' 7"  (1.702 m)   Wt 136 lb (61.7 kg)   SpO2 97%   BMI 21.30 kg/m  Well developed and well nourished in no acute distress HENT normal E scleral and icterus clear Neck Supple JVP flat; carotids brisk and full Clear to ausculation  Regular rate and rhythm, no murmurs gallops or rub Soft with active bowel sounds No clubbing cyanosis  Edema Alert and oriented, grossly normal motor and sensory function Skin Warm and Dry  ECG demonstrates sinus rhythm at 68 Intervals 22/17/48 Axis -42 Bifascicular block   Assessment and  Plan  Ventricular Tachycardia   Coronary artery disease with depressed LV function EF 35%  Status post CABG   Without symptoms of ischemia  Recurrent tachypalpitations I think are likely ventricular tachycardia. They are relatively brief. He is tolerating them well. This juncture we will continue him on mexiletine  As well as ranolazine. Both of these doses could be uptitrated as necessary.  Otherwise on guideline directed therapy. Without evidence of heart failure.

## 2016-12-28 ENCOUNTER — Other Ambulatory Visit: Payer: Self-pay | Admitting: Cardiovascular Disease

## 2017-01-06 ENCOUNTER — Other Ambulatory Visit: Payer: Self-pay | Admitting: Cardiovascular Disease

## 2017-01-07 ENCOUNTER — Other Ambulatory Visit: Payer: Self-pay | Admitting: Cardiovascular Disease

## 2017-01-12 DIAGNOSIS — D225 Melanocytic nevi of trunk: Secondary | ICD-10-CM | POA: Diagnosis not present

## 2017-01-12 DIAGNOSIS — X32XXXD Exposure to sunlight, subsequent encounter: Secondary | ICD-10-CM | POA: Diagnosis not present

## 2017-01-12 DIAGNOSIS — L570 Actinic keratosis: Secondary | ICD-10-CM | POA: Diagnosis not present

## 2017-02-05 ENCOUNTER — Encounter: Payer: Self-pay | Admitting: Nurse Practitioner

## 2017-02-06 DIAGNOSIS — Z23 Encounter for immunization: Secondary | ICD-10-CM | POA: Diagnosis not present

## 2017-02-09 ENCOUNTER — Other Ambulatory Visit: Payer: Self-pay | Admitting: Cardiovascular Disease

## 2017-02-09 ENCOUNTER — Other Ambulatory Visit: Payer: Self-pay | Admitting: Nurse Practitioner

## 2017-02-17 ENCOUNTER — Ambulatory Visit (INDEPENDENT_AMBULATORY_CARE_PROVIDER_SITE_OTHER): Payer: Medicare PPO | Admitting: Nurse Practitioner

## 2017-02-17 ENCOUNTER — Encounter: Payer: Self-pay | Admitting: Nurse Practitioner

## 2017-02-17 VITALS — BP 120/58 | HR 71 | Ht 67.0 in | Wt 137.4 lb

## 2017-02-17 DIAGNOSIS — I259 Chronic ischemic heart disease, unspecified: Secondary | ICD-10-CM

## 2017-02-17 DIAGNOSIS — I5022 Chronic systolic (congestive) heart failure: Secondary | ICD-10-CM | POA: Diagnosis not present

## 2017-02-17 DIAGNOSIS — I255 Ischemic cardiomyopathy: Secondary | ICD-10-CM | POA: Diagnosis not present

## 2017-02-17 DIAGNOSIS — E7800 Pure hypercholesterolemia, unspecified: Secondary | ICD-10-CM

## 2017-02-17 DIAGNOSIS — I472 Ventricular tachycardia, unspecified: Secondary | ICD-10-CM

## 2017-02-17 NOTE — Progress Notes (Signed)
CARDIOLOGY OFFICE NOTE  Date:  02/17/2017    Karl Stevenson Date of Birth: [birth year] Medical Record #[id number]  PCP:  Jarome Matin, MD  Cardiologist:  Kirt Boys   Chief Complaint  Patient presents with  . Coronary Artery Disease    Follow up visit - seen for Dr. Excell Seltzer    History of Present Illness: Karl Stevenson is a 83 y.o. male who presents today for a follow up visit. Seen for Dr. Verlon Au.   He has a hx of CAD status post CABG (original surgery in 1987, redo in 2008), ischemic cardiomyopathy, systolic CHF - last echo from July 2017 showed his EF had improved back to his baseline (35%), HTN, PAD, carotid stenosis status post L CEA, AAA, mild R renal artery stenosis, COPD. Admitted in 11/16 with ventricular tachycardia. Patient and his family wanted to avoid device implantation. Therefore, he was treated medically. He was placed on amiodarone, mexiletine and Ranexa. Amiodarone was DC'd 2/2 bradycardia.   Admitted March 2017with chest pain with questionable loss of consciousness. He had minimal elevation in troponin without clear trend. This was not felt to represent ACS. Lastcardiac catheterization in 10/16 with stable anatomy and no target for PCI. Decision was made to continue medical therapy. His nitrate dose was increased.  Saw Dr. Excell Seltzer in June of 2017 had just been discharged after a syncopal spell in the setting of GI illness.   I  saw him back in October of 2017 - getting frail but cardiac status seemed ok. Had had a fall and needed stitches to his head.He had stopped driving earlier this year in 2018.   I last saw him back in May - he had been admitted the month prior with chest pain - BP was up and Norvasc was added back to his regimen and then changed to just prn SBG over 130. At his return visit he was doing ok.   Comes in today. Here alone. Wife (Olive) in the lobby. He says he is doing ok. Having some memory issues. No chest pain.  He tells me that Dr. Graciela Husbands stopped the prn use of Norvasc, so he is not using this. His readings from home are noted and for the most part are ok - some lability noted but ok overall.  He says he has not really had any of the fast heart beating since his visit with Dr. Graciela Husbands but I do see readings where he had low BP (down in the 80's systolic) with elevated HR (i.e., 131). No passing out. Trying to walk and says he is getting around pretty good. No falls. He feels like he is doing ok.   Past Medical History:  Diagnosis Date  . AAA (abdominal aortic aneurysm) (HCC)    a. Ectatic abdominal aorta with fusiform dilitation 3.4x3.4 and moderate thrombus burden 12/2010  . Bradycardia    a. previous intolerance to beta blockers - low dose toprol started 10/2011 for tachycardia but had rash and was discontinued;  b. tolerating low dose propranolol.  . Carotid artery occlusion    a. s/p L CEA in 2010;  b. 06/2014 Carotid U/S: stable LICA CEA site w/o significant stenosis bilat.  . Chronic systolic heart failure (HCC)    a. 02/2015 Echo:  35-40% (echo 11/2010); c. 02/2015 Echo: EF 20-25%, diff HK, mid-apicalanteroseptal and apical AK, Gr 1 DD, mild MR.  [name] COPD (chronic obstructive pulmonary disease) (HCC)   . Coronary artery disease    a.  1969 s/p MI;  b. S/P CABG 1987;  c. S/P redo 2008;  d. 07/2012 Cath: stable->Med Rx, EF 40%; e. 02/2014 MV: anterior and posterolateral/apical infarct with mild peri-infarct ischemia->Med Rx; f. 02/2015 Cath: LM 100d, LAD 100ost, LCX 100ost, OM2 100, VG->OM3->OM4 nl, VG-> LAD nl (Y from VG->OM), VG->OM2 100p.  . Crohn's disease (HCC)    a. s/p colon resection  . Gout   . Hyperlipidemia   . Hypertension   . Ischemic cardiomyopathy    a. EF 34% (Lexiscan 06/2011); b. 35-40% (echo 11/2010); c. 02/2015 Echo: EF 20-25%, diff HK, mid-apicalanteroseptal and apical AK, Gr 1 DD, mild MR.  . Peripheral vascular disease (HCC)    a. Carotid dz s/p L CEA, RAS, AAA, LE dz  . RBBB   .  Renal artery stenosis (HCC)    a. Dopplers 12/2010 - 1-59% R ostial renal artery stenosis, 2 L renal arteries both widely patent  . Syncope   . Tachycardia    a. 10/2011 - atrial tach vs avnrt - BB started.  . Ventricular tachycardia (HCC)    a. 02/2015 ->amio 200 daily added-->later switched to mexilitene.    Past Surgical History:  Procedure Laterality Date  . ABDOMINAL AORTAGRAM N/A 06/11/2011   Procedure: ABDOMINAL [name];  Surgeon: [name], MD;  Location: [facility] CATH LAB;  Service: Cardiovascular;  Laterality: N/A;  . CARDIAC CATHETERIZATION  07/28/2012   Native 3v CAD, continued patency of the SVG-OM1-LAD and SVG-OM2-left PDA. LVEF 40% with multiple WMAs  . CARDIAC CATHETERIZATION N/A 03/08/2015   Procedure: Left Heart Cath and Coronary Angiography;  Surgeon: [name], MD;  Location: [facility] INVASIVE CV LAB;  Service: Cardiovascular;  Laterality: N/A;  . CAROTID ENDARTERECTOMY Left 2010  . CATARACT EXTRACTION, BILATERAL    . COLON RESECTION  2008  . COLON SURGERY    . CORONARY ANGIOPLASTY    . CORONARY ARTERY BYPASS GRAFT  10/31/85 & 08/19/06  . ENDARTERECTOMY  08/26/2011   Procedure: ENDARTERECTOMY ILIAC;  Surgeon: [name], MD;  Location: [facility] OR;  Service: Vascular;  Laterality: Right;  Right Femoral Artery Endarterectomy with vascu guard patch angioplasty & intraoperative arteriogram.  . EYE SURGERY    . FEMORAL ENDARTERECTOMY Left  06/2011   ileofemoral endarterectomy with bovine patch angioplasty  . LEFT HEART CATHETERIZATION WITH CORONARY ANGIOGRAM N/A 07/28/2012   Procedure: LEFT HEART CATHETERIZATION WITH CORONARY ANGIOGRAM;  Surgeon: [name], MD;  Location: [facility] CATH LAB;  Service: Cardiovascular;  Laterality: N/A;  . PR VEIN BYPASS GRAFT,AORTO-FEM-POP  10/1985  . TONSILLECTOMY     as a child     Medications: Current Meds  Medication Sig  . acetaminophen (TYLENOL) 500 MG tablet Take 1,000 mg by mouth every 6 (six) hours as needed (pain).   [name] aspirin EC 81 MG  tablet Take 81 mg by mouth daily.  [name] atorvastatin (LIPITOR) 10 MG tablet Take 10 mg by mouth at bedtime.   . carboxymethylcellulose (REFRESH TEARS) 0.5 % SOLN Place 1 drop into both eyes 4 (four) times daily.  . clopidogrel (PLAVIX) 75 MG tablet TAKE ONE TABLET BY MOUTH ONCE DAILY  . ipratropium (ATROVENT) 0.03 % nasal spray Place 1 spray into both nostrils daily.   . isosorbide mononitrate (IMDUR) 120 MG 24 hr tablet TAKE ONE TABLET BY MOUTH ONCE DAILY  . isosorbide mononitrate (IMDUR) 60 MG 24 hr tablet TAKE 1 TABLET BY MOUTH ONCE DAILY IN ADDITION TO ISOSORBIDE 120 MG TABLETS DAILY  . Lactase (LACTAID PO)  Take 1 tablet by mouth 2 (two) times daily.  [name] losartan (COZAAR) 25 MG tablet TAKE ONE TABLET BY MOUTH ONCE DAILY  . mexiletine (MEXITIL) 200 MG capsule TAKE ONE CAPSULE BY MOUTH EVERY 12 HOURS  . Multiple Vitamin (MULTIVITAMIN WITH MINERALS) TABS tablet Take 2 tablets by mouth daily.  . nitroGLYCERIN (NITROSTAT) 0.4 MG SL tablet Place 0.4 mg under the tongue every 5 (five) minutes as needed for chest pain.  [name] OVER THE COUNTER MEDICATION See admin instructions. Barley Life by Aim (ground barley): Mix 1 scoop into 6-8 ounces of juice and drink once a day  . pantoprazole (PROTONIX) 40 MG tablet TAKE ONE TABLET BY MOUTH ONCE DAILY.  [name] polyethylene glycol powder (GLYCOLAX/MIRALAX) powder Take 17 g by mouth at bedtime. Mix in 8 oz liquid and drink  . ranolazine (RANEXA) 500 MG 12 hr tablet Take 2 tablets (1,000 mg total) by mouth 2 (two) times daily.  . vitamin B-12 (CYANOCOBALAMIN) 1000 MCG tablet Take 2,000 mcg by mouth daily.     Allergies: Allergies  Allergen Reactions  . Amoxicillin     diaharrea  . Novocain [Procaine Hcl] Other (See Comments)    Cold sweats and very bad headache.   . Procaine Other (See Comments)    Reaction not recalled by the patient  . Metoprolol Hives, Itching and Rash    Social History: The patient  reports that he has never smoked. He has never used  smokeless tobacco. He reports that he does not drink alcohol or use drugs.   Family History: The patient's family history includes CAD in his brother; COPD in his brother and mother; Cancer in his mother; Deep vein thrombosis in his brother; Heart disease in his father.   Review of Systems: Please see the history of present illness.   Otherwise, the review of systems is positive for none.   All other systems are reviewed and negative.   Physical Exam: VS:  BP (!) 120/58 (BP Location: Left Arm, Patient Position: Sitting, Cuff Size: Normal)   Pulse 71   Ht  (1.702 m)   Wt 137 lb 6.4 oz (62.3 kg)   SpO2 99% Comment: at rest  BMI 21.52 kg/m  .  BMI Body mass index is 21.52 kg/m.  Wt Readings from Last 3 Encounters:  02/17/17 137 lb 6.4 oz (62.3 kg)  12/02/16 136 lb (61.7 kg)  09/15/16 134 lb (60.8 kg)    General: Pleasant. Elderly male. Alert and in no acute distress.   HEENT: Normal.  Neck: Supple, no JVD, carotid bruits, or masses noted.  Cardiac: Regular rate and rhythm. No murmurs, rubs, or gallops. No edema.  Respiratory:  Lungs are clear to auscultation bilaterally with normal work of breathing.  GI: Soft and nontender.  MS: No deformity or atrophy. Gait and ROM intact.  Skin: Warm and dry. Color is normal.  Neuro:  Strength and sensation are intact and no gross focal deficits noted.  Psych: Alert, appropriate and with normal affect.   LABORATORY DATA:  EKG:  EKG is not ordered today.  Lab Results  Component Value Date   WBC 6.1 08/19/2016   HGB 11.7 (L) 08/19/2016   HCT 35.4 (L) 08/19/2016   PLT 140 (L) 08/19/2016   GLUCOSE 116 (H) 08/19/2016   CHOL 122 (L) 03/03/2016   TRIG 174 (H) 03/03/2016   HDL 39 (L) 03/03/2016   LDLCALC 48 03/03/2016   ALT 11 03/03/2016   AST 16 03/03/2016   NA 137  08/19/2016   K 4.3 08/19/2016   CL 105 08/19/2016   CREATININE 1.52 (H) 08/19/2016   BUN 25 (H) 08/19/2016   CO2 23 08/19/2016   TSH 2.053 10/24/2015   INR 1.20  07/27/2015     BNP (last 3 results) No results for input(s): BNP in the last 8760 hours.  ProBNP (last 3 results) No results for input(s): PROBNP in the last 8760 hours.   Other Studies Reviewed Today:  Echo Study Conclusions from 11/2015  - Left ventricle: The cavity size was normal. Systolic function was moderately to severely reduced. The estimated ejection fraction was in the range of 30% to 35%. There is akinesis of the apicalanterior, lateral, inferolateral, inferior, and apical myocardium. There is akinesis of the midanteroseptal myocardium. There was an increased relative contribution of atrial contraction to ventricular filling. Doppler parameters are consistent with abnormal left ventricular relaxation (grade 1 diastolic dysfunction). - Aortic valve: Trileaflet; normal thickness, mildly calcified leaflets. - Mitral valve: Calcified annulus. There was mild regurgitation.    Cath 03-08-2015:    Conclusion    Sequential SVG-OM2-OM3 was injected is large, and is anatomically normal.  Y Graft SVG-LAD from SVG-OM is large, and is anatomically normal. The downstream LAD is small caliber and diffusely diseased.  LM lesion, 100% stenosed.  Ost LAD to Prox LAD lesion, 100% stenosed.  Ost 2nd Mrg to 2nd Mrg lesion, 100% stenosed.  SVG was injected is small. JR4 Catheter  Prox Original SVG-OM1 100% stenosed.  Ost Cx to Mid Cx lesion, 100% stenosed.  Borderline LOW LVEDP   Impression:  Known severe native coronary disease. Disabled contrast the native arteries were not evaluated.  Occluded old SVG-OM1 - the dye remained standing in the proximal vessel for several minutes following angiography - not PCI amenable ? This is the likely culprit for the patient's episode of what sounds like anginal symptoms about a month ago. This may also be a cause of now worsening ventricular tachycardia.   Widely patent Y graft with one limb providing  sequential grafting to OM1 and OM 2 as well as a separate limb serving as a graft to the LAD which retrograde fills the diagonal branch. This redo graft now provides the entirety of the patient's coronary circulation.  The graft obtuse marginal branches are relatively free of disease with retrograde filling back to the distal circumflex system which consists of the posterolateral branch and left PDA.  The grafted LAD itself was a very diffusely diseased vessel, but no change from previous.  Known small nondominant right coronary artery system.  Borderline low LVEDP   Recommendations:  No PCI targets.  Standard post radial cath care with TR band removal  Would proceed with ICD placement per EP    ASSESSMENT AND PLAN:  1. CAD - managed medically. No targets for PCI.  I have left him on his current regimen. He is doing well clinically without chest pain.   2. Past syncope: lastevent was related to volume depletion. No recurrence fortunately.   2. Chronic systolic heart failure - looks pretty well compensated. He has not been able to tolerate beta blockers because of bradycardic events. Last echo showed that his EF had improved to his baseline at 35%. No change with current regimen for now. Would continue for now.   3. Paroxysmal ventricular tachycardia: Followed by Dr. Graciela Husbands. Treated with mexiletine and Ranexa. Has declined device implant. He has had some fast HR notes but more recently this looks stable and he clinically  feels much better. He will continue to monitor. No syncope.   4. Hypertension:  He does a good job of monitoring. His readings from home are ok for the most part. Continue current medicines.  5. Hyperlipidemia: Treated with atorvastatin. Seeing PCP in January for his physical and labs.    6. Chronic kidney disease stage III:          7. Advancing age    Current medicines are reviewed with the patient today.  The patient does not have concerns  regarding medicines other than what has been noted above.  The following changes have been made:  See above.  Labs/ tests ordered today include:   No orders of the defined types were placed in this encounter.    Disposition:   FU with Amber in January; I will see in April.    Patient is agreeable to this plan and will call if any problems develop in the interim.   SignedNorma Fredrickson, NP  02/17/2017 11:22 AM  [facility] [address] [place], [place]  [address] Phone: [phone] Fax: [phone]

## 2017-02-17 NOTE — Patient Instructions (Addendum)
We will be checking the following labs today - NONE   Medication Instructions:    Continue with your current medicines.     Testing/Procedures To Be Arranged:  N/A  Follow-Up:   See Amber in January  See me in April of 2019.    Other Special Instructions:   Keep up the good work!    If you need a refill on your cardiac medications before your next appointment, please call your pharmacy.   Call the [facility] office at [phone] if you have any questions, problems or concerns.

## 2017-03-19 ENCOUNTER — Telehealth: Payer: Self-pay | Admitting: Nurse Practitioner

## 2017-03-19 ENCOUNTER — Other Ambulatory Visit: Payer: Self-pay | Admitting: *Deleted

## 2017-03-19 MED ORDER — MEXILETINE HCL 200 MG PO CAPS: ORAL_CAPSULE | ORAL | 3 refills | Status: DC

## 2017-03-19 NOTE — Telephone Encounter (Signed)
New message    Pt is calling asking for a call back.   Pt c/o medication issue:  1. Name of Medication: mexilentine   2. How are you currently taking this medication (dosage and times per day)? 200 mg  3. Are you having a reaction (difficulty breathing--STAT)? no  4. What is your medication issue? Insurance will not cover after Jan. 1st. He wants to know if he can have a refill for 90 day

## 2017-03-19 NOTE — Telephone Encounter (Signed)
S/w pt stated received letter from [facility] was not going to cover the Mexilentine. Sent in a 90 day supply and when pt comes back to see Gypsy Balsam, NP, in December bring letter.

## 2017-03-27 ENCOUNTER — Other Ambulatory Visit: Payer: Self-pay | Admitting: Cardiovascular Disease

## 2017-04-22 ENCOUNTER — Other Ambulatory Visit: Payer: Self-pay | Admitting: Physician Assistant

## 2017-04-30 ENCOUNTER — Ambulatory Visit: Payer: Medicare PPO | Admitting: Nurse Practitioner

## 2017-04-30 ENCOUNTER — Encounter: Payer: Self-pay | Admitting: Nurse Practitioner

## 2017-04-30 ENCOUNTER — Telehealth: Payer: Self-pay

## 2017-04-30 VITALS — BP 124/70 | HR 72 | Ht 67.0 in | Wt 132.0 lb

## 2017-04-30 DIAGNOSIS — I2510 Atherosclerotic heart disease of native coronary artery without angina pectoris: Secondary | ICD-10-CM

## 2017-04-30 DIAGNOSIS — I472 Ventricular tachycardia, unspecified: Secondary | ICD-10-CM

## 2017-04-30 DIAGNOSIS — I5022 Chronic systolic (congestive) heart failure: Secondary | ICD-10-CM | POA: Diagnosis not present

## 2017-04-30 NOTE — Progress Notes (Signed)
Electrophysiology Office Note Date: 04/30/2017  ID:  OVERTON BOGGUS, DOB [birth year], MRN [id number]  PCP: Jarome Matin, MD Primary Cardiologist: Excell Seltzer Electrophysiologist: Graciela Husbands  CC: VT follow up  Karl Stevenson is a 83 y.o. male seen today for Dr Graciela Husbands.  He presents today for routine electrophysiology followup.  Since last being seen in our clinic, the patient reports doing very well.  He denies chest pain, palpitations, dyspnea, PND, orthopnea, nausea, vomiting, dizziness, syncope, edema, weight gain, or early satiety.  His blood pressure log that he brings with him today only has one high heart rate reading (113).  He continues to have balance issues but has not fallen.  He received a letter from his insurance that they will no longer cover Mexiletine after the first of the year - they are recommending amiodarone.   Past Medical History:  Diagnosis Date  . AAA (abdominal aortic aneurysm) (HCC)    a. Ectatic abdominal aorta with fusiform dilitation 3.4x3.4 and moderate thrombus burden 12/2010  . Bradycardia    a. previous intolerance to beta blockers - low dose toprol started 10/2011 for tachycardia but had rash and was discontinued;  b. tolerating low dose propranolol.  . Carotid artery occlusion    a. s/p L CEA in 2010;  b. 06/2014 Carotid U/S: stable LICA CEA site w/o significant stenosis bilat.  . Chronic systolic heart failure (HCC)    a. 02/2015 Echo:  35-40% (echo 11/2010); c. 02/2015 Echo: EF 20-25%, diff HK, mid-apicalanteroseptal and apical AK, Gr 1 DD, mild MR.  [name] COPD (chronic obstructive pulmonary disease) (HCC)   . Coronary artery disease    a. 1969 s/p MI;  b. S/P CABG 1987;  c. S/P redo 2008;  d. 07/2012 Cath: stable->Med Rx, EF 40%; e. 02/2014 MV: anterior and posterolateral/apical infarct with mild peri-infarct ischemia->Med Rx; f. 02/2015 Cath: LM 100d, LAD 100ost, LCX 100ost, OM2 100, VG->OM3->OM4 nl, VG-> LAD nl (Y from VG->OM), VG->OM2 100p.  . Crohn's  disease (HCC)    a. s/p colon resection  . Gout   . Hyperlipidemia   . Hypertension   . Ischemic cardiomyopathy    a. EF 34% (Lexiscan 06/2011); b. 35-40% (echo 11/2010); c. 02/2015 Echo: EF 20-25%, diff HK, mid-apicalanteroseptal and apical AK, Gr 1 DD, mild MR.  . Peripheral vascular disease (HCC)    a. Carotid dz s/p L CEA, RAS, AAA, LE dz  . RBBB   . Renal artery stenosis (HCC)    a. Dopplers 12/2010 - 1-59% R ostial renal artery stenosis, 2 L renal arteries both widely patent  . Syncope   . Tachycardia    a. 10/2011 - atrial tach vs avnrt - BB started.  . Ventricular tachycardia (HCC)    a. 02/2015 ->amio 200 daily added-->later switched to mexilitene.   Past Surgical History:  Procedure Laterality Date  . ABDOMINAL AORTAGRAM N/A 06/11/2011   Procedure: ABDOMINAL [name];  Surgeon: [name], MD;  Location: [facility] CATH LAB;  Service: Cardiovascular;  Laterality: N/A;  . CARDIAC CATHETERIZATION  07/28/2012   Native 3v CAD, continued patency of the SVG-OM1-LAD and SVG-OM2-left PDA. LVEF 40% with multiple WMAs  . CARDIAC CATHETERIZATION N/A 03/08/2015   Procedure: Left Heart Cath and Coronary Angiography;  Surgeon: [name], MD;  Location[facility] INVASIVE CV LAB;  Service: Cardiovascular;  Laterality: N/A;  . CAROTID ENDARTERECTOMY Left 2010  . CATARACT EXTRACTION, BILATERAL    . COLON RESECTION  2008  . COLON SURGERY    .  CORONARY ANGIOPLASTY    . CORONARY ARTERY BYPASS GRAFT  10/31/85 & 08/19/06  . ENDARTERECTOMY  08/26/2011   Procedure: ENDARTERECTOMY ILIAC;  Surgeon: Fransisco HertzBrian L Chen, MD;  [facility];  Service: Vascular;  Laterality: Right;  Right Femoral Artery Endarterectomy with vascu guard patch angioplasty & intraoperative arteriogram.  . EYE SURGERY    . FEMORAL ENDARTERECTOMY Left  06/2011   ileofemoral endarterectomy with bovine patch angioplasty  . LEFT HEART CATHETERIZATION WITH CORONARY ANGIOGRAM N/A 07/28/2012   Procedure: LEFT HEART CATHETERIZATION WITH CORONARY  ANGIOGRAM;  Surgeon: Tonny BollmanMichael Cooper, MD;  [facility];  Service: Cardiovascular;  Laterality: N/A;  . PR VEIN BYPASS GRAFT,AORTO-FEM-POP  10/1985  . TONSILLECTOMY     as a child    Current Outpatient Medications  Medication Sig Dispense Refill  . acetaminophen (TYLENOL) 500 MG tablet Take 1,000 mg by mouth every 6 (six) hours as needed (pain).     [name]. aspirin EC 81 MG tablet Take 81 mg by mouth daily.    [name]. atorvastatin (LIPITOR) 10 MG tablet Take 10 mg by mouth at bedtime.     . carboxymethylcellulose (REFRESH TEARS) 0.5 % SOLN Place 1 drop into both eyes 4 (four) times daily.    . clopidogrel (PLAVIX) 75 MG tablet TAKE ONE TABLET BY MOUTH ONCE DAILY 90 tablet 1  . ipratropium (ATROVENT) 0.03 % nasal spray Place 1 spray into both nostrils daily.     . isosorbide mononitrate (IMDUR) 120 MG 24 hr tablet TAKE ONE TABLET BY MOUTH ONCE DAILY 90 tablet 1  . isosorbide mononitrate (IMDUR) 60 MG 24 hr tablet TAKE 1 TABLET BY MOUTH ONCE DAILY IN ADDITION TO ISOSORBIDE 120 MG TABLETS DAILY 90 tablet 1  . Lactase (LACTAID PO) Take 1 tablet by mouth 2 (two) times daily.    [name]. losartan (COZAAR) 25 MG tablet TAKE 1 TABLET BY MOUTH ONCE DAILY 90 tablet 2  . mexiletine (MEXITIL) 200 MG capsule TAKE ONE CAPSULE BY MOUTH EVERY 12 HOURS 180 capsule 3  . Multiple Vitamin (MULTIVITAMIN WITH MINERALS) TABS tablet Take 2 tablets by mouth daily.    . nitroGLYCERIN (NITROSTAT) 0.4 MG SL tablet Place 0.4 mg under the tongue every 5 (five) minutes as needed for chest pain.    [name]. OVER THE COUNTER MEDICATION See admin instructions. Barley Life by Aim (ground barley): Mix 1 scoop into 6-8 ounces of juice and drink once a day    . pantoprazole (PROTONIX) 40 MG tablet TAKE ONE TABLET BY MOUTH ONCE DAILY. 90 tablet 3  . polyethylene glycol powder (GLYCOLAX/MIRALAX) powder Take 17 g by mouth at bedtime. Mix in 8 oz liquid and drink    . RANEXA 500 MG 12 hr tablet TAKE 2 TABLETS BY MOUTH TWICE DAILY 360 tablet 3  .  vitamin B-12 (CYANOCOBALAMIN) 1000 MCG tablet Take 2,000 mcg by mouth daily.     No current facility-administered medications for this visit.     Allergies:   Amoxicillin; Novocain [procaine hcl]; Procaine; and Metoprolol   Social History: Social History   Socioeconomic History  . Marital status: Married    Spouse name: Not on file  . Number of children: Not on file  . Years of education: Not on file  . Highest education level: Not on file  Social Needs  . Financial resource strain: Not on file  . Food insecurity - worry: Not on file  . Food insecurity - inability: Not on file  . Transportation needs - medical: Not  on file  . Transportation needs - non-medical: Not on file  Occupational History  . Not on file  Tobacco Use  . Smoking status: Never Smoker  . Smokeless tobacco: Never Used  Substance and Sexual Activity  . Alcohol use: No  . Drug use: No  . Sexual activity: Not on file  Other Topics Concern  . Not on file  Social History Narrative  . Not on file    Family History: Family History  Problem Relation Age of Onset  . Heart disease Father   . Cancer Mother   . COPD Mother   . Deep vein thrombosis Brother   . COPD Brother   . CAD Brother   . Anesthesia problems Neg Hx     Review of Systems: All other systems reviewed and are otherwise negative except as noted above.   Physical Exam: VS:  BP 124/70   Pulse 72   Ht 5\' 7"  (1.702 m)   Wt 132 lb (59.9 kg)   SpO2 98%   BMI 20.67 kg/m  , BMI Body mass index is 20.67 kg/m. Wt Readings from Last 3 Encounters:  04/30/17 132 lb (59.9 kg)  02/17/17 137 lb 6.4 oz (62.3 kg)  12/02/16 136 lb (61.7 kg)    GEN- The patient is elderly appearing, alert and oriented x 3 today.   HEENT: normocephalic, atraumatic; sclera clear, conjunctiva pink; hearing intact; oropharynx clear; neck supple Lungs- Clear to ausculation bilaterally, normal work of breathing.  No wheezes, rales, rhonchi Heart- Regular rate and  rhythm GI- soft, non-tender, non-distended, bowel sounds present, no hepatosplenomegaly Extremities- no clubbing, cyanosis, or edema MS- no significant deformity or atrophy Skin- warm and dry, no rash or lesion  Psych- euthymic mood, full affect Neuro- strength and sensation are intact   EKG:  EKG is not ordered today.  Recent Labs: 08/19/2016: BUN 25; Creatinine, Ser 1.52; Hemoglobin 11.7; Platelets 140; Potassium 4.3; Sodium 137    Other studies Reviewed: Additional studies/ records that were reviewed today include: Dr Graciela Husbands and Lori's office notes  Assessment and Plan:  1.  VT No significant recurrence on mexiletine and ranexa No syncope Continue current therapy Will have our prior authorization team call [employer] to try to get override for Mexiletine  2.  Chronic systolic heart failure Euvolemic on exam No changes today  3.  CAD No recent ischemic symptoms    Current medicines are reviewed at length with the patient today.   The patient does not have concerns regarding his medicines.  The following changes were made today:  none  Labs/ tests ordered today include: none No orders of the defined types were placed in this encounter.    Disposition:   Follow up with Dr Graciela Husbands in 6 months    Signed, Gypsy Balsam, NP 04/30/2017 11:25 AM   [facility] [address] [place] [address] [phone] (office) [phone] (fax)

## 2017-04-30 NOTE — Patient Instructions (Signed)
Medication Instructions:   Your physician recommends that you continue on your current medications as directed. Please refer to the Current Medication list given to you today.   If you need a refill on your cardiac medications before your next appointment, please call your pharmacy.  Labwork:  NONE ORDERED  TODAY    Testing/Procedures: NONE ORDERED  TODAY    Follow-Up:  Your physician wants you to follow-up in:  IN  6  MONTHS WITH DR KLEIN  You will receive a reminder letter in the mail two months in advance. If you don't receive a letter, please call our office to schedule the follow-up appointment.      Any Other Special Instructions Will Be Listed Below (If Applicable).                                                                                                                                                   

## 2017-04-30 NOTE — Telephone Encounter (Signed)
Patient brings a letter in from [facility] today, telling him that Mexitil will no longer be covered in 2019. I have submitted a prior auth for formulary exception for Mexitil.

## 2017-06-08 DIAGNOSIS — R82998 Other abnormal findings in urine: Secondary | ICD-10-CM | POA: Diagnosis not present

## 2017-06-08 DIAGNOSIS — M109 Gout, unspecified: Secondary | ICD-10-CM | POA: Diagnosis not present

## 2017-06-08 DIAGNOSIS — I10 Essential (primary) hypertension: Secondary | ICD-10-CM | POA: Diagnosis not present

## 2017-06-08 DIAGNOSIS — E7849 Other hyperlipidemia: Secondary | ICD-10-CM | POA: Diagnosis not present

## 2017-06-08 DIAGNOSIS — Z125 Encounter for screening for malignant neoplasm of prostate: Secondary | ICD-10-CM | POA: Diagnosis not present

## 2017-06-15 DIAGNOSIS — Z0000 Encounter for general adult medical examination without abnormal findings: Secondary | ICD-10-CM | POA: Diagnosis not present

## 2017-06-15 DIAGNOSIS — N183 Chronic kidney disease, stage 3 (moderate): Secondary | ICD-10-CM | POA: Diagnosis not present

## 2017-06-15 DIAGNOSIS — I2510 Atherosclerotic heart disease of native coronary artery without angina pectoris: Secondary | ICD-10-CM | POA: Diagnosis not present

## 2017-06-15 DIAGNOSIS — K5090 Crohn's disease, unspecified, without complications: Secondary | ICD-10-CM | POA: Diagnosis not present

## 2017-06-15 DIAGNOSIS — J449 Chronic obstructive pulmonary disease, unspecified: Secondary | ICD-10-CM | POA: Diagnosis not present

## 2017-06-15 DIAGNOSIS — G6289 Other specified polyneuropathies: Secondary | ICD-10-CM | POA: Diagnosis not present

## 2017-06-15 DIAGNOSIS — H6121 Impacted cerumen, right ear: Secondary | ICD-10-CM | POA: Diagnosis not present

## 2017-06-15 DIAGNOSIS — I7389 Other specified peripheral vascular diseases: Secondary | ICD-10-CM | POA: Diagnosis not present

## 2017-06-15 DIAGNOSIS — I255 Ischemic cardiomyopathy: Secondary | ICD-10-CM | POA: Diagnosis not present

## 2017-07-09 ENCOUNTER — Other Ambulatory Visit: Payer: Self-pay | Admitting: Cardiovascular Disease

## 2017-07-13 DIAGNOSIS — X32XXXD Exposure to sunlight, subsequent encounter: Secondary | ICD-10-CM | POA: Diagnosis not present

## 2017-07-13 DIAGNOSIS — L570 Actinic keratosis: Secondary | ICD-10-CM | POA: Diagnosis not present

## 2017-07-13 DIAGNOSIS — D225 Melanocytic nevi of trunk: Secondary | ICD-10-CM | POA: Diagnosis not present

## 2017-07-13 DIAGNOSIS — D044 Carcinoma in situ of skin of scalp and neck: Secondary | ICD-10-CM | POA: Diagnosis not present

## 2017-07-14 ENCOUNTER — Encounter: Payer: Self-pay | Admitting: Nurse Practitioner

## 2017-07-14 ENCOUNTER — Ambulatory Visit: Payer: Medicare PPO | Admitting: Nurse Practitioner

## 2017-07-14 VITALS — BP 150/70 | HR 77 | Ht 66.0 in | Wt 135.8 lb

## 2017-07-14 DIAGNOSIS — E7800 Pure hypercholesterolemia, unspecified: Secondary | ICD-10-CM | POA: Diagnosis not present

## 2017-07-14 DIAGNOSIS — I255 Ischemic cardiomyopathy: Secondary | ICD-10-CM

## 2017-07-14 DIAGNOSIS — I2510 Atherosclerotic heart disease of native coronary artery without angina pectoris: Secondary | ICD-10-CM | POA: Diagnosis not present

## 2017-07-14 DIAGNOSIS — I259 Chronic ischemic heart disease, unspecified: Secondary | ICD-10-CM

## 2017-07-14 DIAGNOSIS — I472 Ventricular tachycardia, unspecified: Secondary | ICD-10-CM

## 2017-07-14 DIAGNOSIS — I5022 Chronic systolic (congestive) heart failure: Secondary | ICD-10-CM

## 2017-07-14 NOTE — Progress Notes (Signed)
CARDIOLOGY OFFICE NOTE  Date:  07/14/2017    Karl Stevenson Date of Birth: 1926-11-22 Medical Record #[id number]  PCP:  Jarome Matin, MD  Cardiologist:  [facility]    Chief Complaint  Patient presents with  . Coronary Artery Disease  . Congestive Heart Failure  . Irregular Heart Beat    Follow up visit -seen for Dr. Excell Seltzer    History of Present Illness: Karl Stevenson is a 84 y.o. male who presents today for a follow up visit. Seen for Dr. Verlon Au. Former patient of Dr. Ronnald Nian.   He has a hx of CAD status post CABG (original surgery in 1987, redo in 2008), ischemic cardiomyopathy, systolic CHF - last echo from July 2017 showed his EF had improved back to his baseline (35%), HTN, PAD, carotid stenosis status post L CEA, AAA, mild R renal artery stenosis, COPD. Admitted in 11/16 with ventricular tachycardia. Patient and his family wanted to avoid device implantation. Therefore, he was treated medically. He was placed on amiodarone, mexiletine and Ranexa. Amiodarone was DC'd 2/2 bradycardia.   Admitted March 2017with chest pain with questionable loss of consciousness. He had minimal elevation in troponin without clear trend. This was not felt to represent ACS. Lastcardiac catheterization in 10/16 with stable anatomy and no target for PCI. Decision was made to continue medical therapy. His nitrate dose was increased.  Saw Dr. Excell Seltzer in June of 2017 had just been discharged after a syncopal spell in the setting of GI illness.   I saw him back in October of 2017 - getting frail but cardiac status seemed ok. Had had a fall and needed stitches to his head.  He had stopped driving in 2202.   I last saw him back in May - he had been admitted the month prior with chest pain - BP was up and Norvasc was added back to his regimen and then changed to just prn SBG over 130.  I last saw him in October - he was doing ok - some memory issues. Dr. Graciela Husbands had  stopped his PRN Norvasc. Seemed to be holding his own.   Comes in today. Here alone. Wife (Olive) in the lobby. He says he is doing ok.  No falls. Using a cane. Has seen Dr. Jarold Motto. He is doing chair exercises. He thinks this has helped his legs not be as weak. No heart pain. He has "stress" - he says it is just "life" and he will have pains all over his body. BP readings from home look ok. Little lability with his HR but nothing that seems excessive.   Past Medical History:  Diagnosis Date  . AAA (abdominal aortic aneurysm) (HCC)    a. Ectatic abdominal aorta with fusiform dilitation 3.4x3.4 and moderate thrombus burden 12/2010  . Bradycardia    a. previous intolerance to beta blockers - low dose toprol started 10/2011 for tachycardia but had rash and was discontinued;  b. tolerating low dose propranolol.  . Carotid artery occlusion    a. s/p L CEA in 2010;  b. 06/2014 Carotid U/S: stable LICA CEA site w/o significant stenosis bilat.  . Chronic systolic heart failure (HCC)    a. 02/2015 Echo:  35-40% (echo 11/2010); c. 02/2015 Echo: EF 20-25%, diff HK, mid-apicalanteroseptal and apical AK, Gr 1 DD, mild MR.  [name] COPD (chronic obstructive pulmonary disease) (HCC)   . Coronary artery disease    a. 1969 s/p MI;  b. S/P CABG 1987;  c.  S/P redo 2008;  d. 07/2012 Cath: stable->Med Rx, EF 40%; e. 02/2014 MV: anterior and posterolateral/apical infarct with mild peri-infarct ischemia->Med Rx; f. 02/2015 Cath: LM 100d, LAD 100ost, LCX 100ost, OM2 100, VG->OM3->OM4 nl, VG-> LAD nl (Y from VG->OM), VG->OM2 100p.  . Crohn's disease (HCC)    a. s/p colon resection  . Gout   . Hyperlipidemia   . Hypertension   . Ischemic cardiomyopathy    a. EF 34% (Lexiscan 06/2011); b. 35-40% (echo 11/2010); c. 02/2015 Echo: EF 20-25%, diff HK, mid-apicalanteroseptal and apical AK, Gr 1 DD, mild MR.  . Peripheral vascular disease (HCC)    a. Carotid dz s/p L CEA, RAS, AAA, LE dz  . RBBB   . Renal artery stenosis (HCC)     a. Dopplers 12/2010 - 1-59% R ostial renal artery stenosis, 2 L renal arteries both widely patent  . Syncope   . Tachycardia    a. 10/2011 - atrial tach vs avnrt - BB started.  . Ventricular tachycardia (HCC)    a. 02/2015 ->amio 200 daily added-->later switched to mexilitene.    Past Surgical History:  Procedure Laterality Date  . ABDOMINAL AORTAGRAM N/A 06/11/2011   Procedure: ABDOMINAL [name];  Surgeon: [name], MD;  Location: [facility] CATH LAB;  Service: Cardiovascular;  Laterality: N/A;  . CARDIAC CATHETERIZATION  07/28/2012   Native 3v CAD, continued patency of the SVG-OM1-LAD and SVG-OM2-left PDA. LVEF 40% with multiple WMAs  . CARDIAC CATHETERIZATION N/A 03/08/2015   Procedure: Left Heart Cath and Coronary Angiography;  Surgeon: [name], MD;  Location: [facility] INVASIVE CV LAB;  Service: Cardiovascular;  Laterality: N/A;  . CAROTID ENDARTERECTOMY Left 2010  . CATARACT EXTRACTION, BILATERAL    . COLON RESECTION  2008  . COLON SURGERY    . CORONARY ANGIOPLASTY    . CORONARY ARTERY BYPASS GRAFT  10/31/85 & 08/19/06  . ENDARTERECTOMY  08/26/2011   Procedure: ENDARTERECTOMY ILIAC;  Surgeon: [name], MD;  Location[facility] OR;  Service: Vascular;  Laterality: Right;  Right Femoral Artery Endarterectomy with vascu guard patch angioplasty & intraoperative arteriogram.  . EYE SURGERY    . FEMORAL ENDARTERECTOMY Left  06/2011   ileofemoral endarterectomy with bovine patch angioplasty  . LEFT HEART CATHETERIZATION WITH CORONARY ANGIOGRAM N/A 07/28/2012   Procedure: LEFT HEART CATHETERIZATION WITH CORONARY ANGIOGRAM;  Surgeon: [name], MD;  Location: [facility] CATH LAB;  Service: Cardiovascular;  Laterality: N/A;  . PR VEIN BYPASS GRAFT,AORTO-FEM-POP  10/1985  . TONSILLECTOMY     as a child     Medications: Current Meds  Medication Sig  . acetaminophen (TYLENOL) 500 MG tablet Take 1,000 mg by mouth every 6 (six) hours as needed (pain).   [name] aspirin EC 81 MG tablet Take 81 mg by mouth  daily.  [name] atorvastatin (LIPITOR) 10 MG tablet Take 10 mg by mouth at bedtime.   . carboxymethylcellulose (REFRESH TEARS) 0.5 % SOLN Place 1 drop into both eyes 4 (four) times daily.  . clopidogrel (PLAVIX) 75 MG tablet TAKE 1 TABLET BY MOUTH ONCE DAILY  . ipratropium (ATROVENT) 0.03 % nasal spray Place 1 spray into both nostrils daily.   . isosorbide mononitrate (IMDUR) 120 MG 24 hr tablet TAKE ONE TABLET BY MOUTH ONCE DAILY  . isosorbide mononitrate (IMDUR) 60 MG 24 hr tablet TAKE 1 TABLET BY MOUTH ONCE DAILY IN ADDITION TO ISOSORBIDE 120 MG TABLETS DAILY  . Lactase (LACTAID PO) Take 1 tablet by mouth 2 (two) times daily.  [name]  losartan (COZAAR) 25 MG tablet TAKE 1 TABLET BY MOUTH ONCE DAILY  . mexiletine (MEXITIL) 200 MG capsule TAKE ONE CAPSULE BY MOUTH EVERY 12 HOURS  . Multiple Vitamin (MULTIVITAMIN WITH MINERALS) TABS tablet Take 2 tablets by mouth daily.  . nitroGLYCERIN (NITROSTAT) 0.4 MG SL tablet Place 0.4 mg under the tongue every 5 (five) minutes as needed for chest pain.  . pantoprazole (PROTONIX) 40 MG tablet TAKE ONE TABLET BY MOUTH ONCE DAILY.  [name] polyethylene glycol powder (GLYCOLAX/MIRALAX) powder Take 17 g by mouth at bedtime. Mix in 8 oz liquid and drink  . RANEXA 500 MG 12 hr tablet TAKE 2 TABLETS BY MOUTH TWICE DAILY  . vitamin B-12 (CYANOCOBALAMIN) 1000 MCG tablet Take 2,000 mcg by mouth daily.     Allergies: Allergies  Allergen Reactions  . Amoxicillin     diaharrea  . Novocain [Procaine Hcl] Other (See Comments)    Cold sweats and very bad headache.   . Procaine Other (See Comments)    Reaction not recalled by the patient  . Metoprolol Hives, Itching and Rash    Social History: The patient  reports that  has never smoked. he has never used smokeless tobacco. He reports that he does not drink alcohol or use drugs.   Family History: The patient's family history includes CAD in his brother; COPD in his brother and mother; Cancer in his mother; Deep vein thrombosis  in his brother; Heart disease in his father.   Review of Systems: Please see the history of present illness.   Otherwise, the review of systems is positive for none.   All other systems are reviewed and negative.   Physical Exam: VS:  BP (!) 150/70 (BP Location: Left Arm, Patient Position: Sitting, Cuff Size: Normal)   Pulse 77   Ht 5\' 6"  (1.676 m)   Wt 135 lb 12.8 oz (61.6 kg)   SpO2 100% Comment: at rest  BMI 21.92 kg/m  .  BMI Body mass index is 21.92 kg/m.  Wt Readings from Last 3 Encounters:  07/14/17 135 lb 12.8 oz (61.6 kg)  04/30/17 132 lb (59.9 kg)  02/17/17 137 lb 6.4 oz (62.3 kg)    General: Pleasant. Elderly male. Alert and in no acute distress.   HEENT: Normal.  Neck: Supple, no JVD, carotid bruits, or masses noted.  Cardiac: Regular rate and rhythm. No murmurs, rubs, or gallops. No edema.  Respiratory:  Lungs are clear to auscultation bilaterally with normal work of breathing.  GI: Soft and nontender.  MS: No deformity or atrophy. Gait and ROM intact.  Skin: Warm and dry. Color is normal.  Neuro:  Strength and sensation are intact and no gross focal deficits noted.  Psych: Alert, appropriate and with normal affect.   LABORATORY DATA:  EKG:  EKG is not ordered today.  Lab Results  Component Value Date   WBC 6.1 08/19/2016   HGB 11.7 (L) 08/19/2016   HCT 35.4 (L) 08/19/2016   PLT 140 (L) 08/19/2016   GLUCOSE 116 (H) 08/19/2016   CHOL 122 (L) 03/03/2016   TRIG 174 (H) 03/03/2016   HDL 39 (L) 03/03/2016   LDLCALC 48 03/03/2016   ALT 11 03/03/2016   AST 16 03/03/2016   NA 137 08/19/2016   K 4.3 08/19/2016   CL 105 08/19/2016   CREATININE 1.52 (H) 08/19/2016   BUN 25 (H) 08/19/2016   CO2 23 08/19/2016   TSH 2.053 10/24/2015   INR 1.20 07/27/2015  BNP (last 3 results) No results for input(s): BNP in the last 8760 hours.  ProBNP (last 3 results) No results for input(s): PROBNP in the last 8760 hours.   Other Studies Reviewed  Today:  Echo Study Conclusions from 11/2015  - Left ventricle: The cavity size was normal. Systolic function was moderately to severely reduced. The estimated ejection fraction was in the range of 30% to 35%. There is akinesis of the apicalanterior, lateral, inferolateral, inferior, and apical myocardium. There is akinesis of the midanteroseptal myocardium. There was an increased relative contribution of atrial contraction to ventricular filling. Doppler parameters are consistent with abnormal left ventricular relaxation (grade 1 diastolic dysfunction). - Aortic valve: Trileaflet; normal thickness, mildly calcified leaflets. - Mitral valve: Calcified annulus. There was mild regurgitation.    Cath 03-08-2015:    Conclusion    Sequential SVG-OM2-OM3 was injected is large, and is anatomically normal.  Y Graft SVG-LAD from SVG-OM is large, and is anatomically normal. The downstream LAD is small caliber and diffusely diseased.  LM lesion, 100% stenosed.  Ost LAD to Prox LAD lesion, 100% stenosed.  Ost 2nd Mrg to 2nd Mrg lesion, 100% stenosed.  SVG was injected is small. JR4 Catheter  Prox Original SVG-OM1 100% stenosed.  Ost Cx to Mid Cx lesion, 100% stenosed.  Borderline LOW LVEDP   Impression:  Known severe native coronary disease. Disabled contrast the native arteries were not evaluated.  Occluded old SVG-OM1 - the dye remained standing in the proximal vessel for several minutes following angiography - not PCI amenable ? This is the likely culprit for the patient's episode of what sounds like anginal symptoms about a month ago. This may also be a cause of now worsening ventricular tachycardia.   Widely patent Y graft with one limb providing sequential grafting to OM1 and OM 2 as well as a separate limb serving as a graft to the LAD which retrograde fills the diagonal branch. This redo graft now provides the entirety of the patient's coronary  circulation.  The graft obtuse marginal branches are relatively free of disease with retrograde filling back to the distal circumflex system which consists of the posterolateral branch and left PDA.  The grafted LAD itself was a very diffusely diseased vessel, but no change from previous.  Known small nondominant right coronary artery system.  Borderline low LVEDP   Recommendations:  No PCI targets.  Standard post radial cath care with TR band removal  Would proceed with ICD placement per EP    ASSESSMENT AND PLAN:  1. CAD - managed medically. No targets for PCI. I have left him on his current regimen. He continues to do ok and does not sound like having  chest pain.   2. Past syncope: lastevent was related to volume depletion. No recurrence fortunately.   2. Chronic systolic heart failure - looks pretty well compensated. He has not been able to tolerate beta blockers because of bradycardic events. Last echo showed that his EF had improved to his baseline at 35%. No change with current regimen for now. Would continue for now.   3. Paroxysmal ventricular tachycardia: Followed by Dr. Graciela Husbands. Treated with mexiletine and Ranexa. Has declined device implant. He has had some fast HR notes but more recently this looks stable and he clinically feels much better. He will continue to monitor. No syncope.   4. Hypertension: He does a good job of monitoring. His readings from home are still ok for the most part. Continue current medicines.  No  changes made for today.   5. Hyperlipidemia: Treated with atorvastatin. has had recent lipids from January noted. Will try to obtain his other labs.   6. Chronic kidney disease stage III:          7. Advancing age     Current medicines are reviewed with the patient today.  The patient does not have concerns regarding medicines other than what has been noted above.  The following changes have been made:  See above.  Labs/ tests  ordered today include:   No orders of the defined types were placed in this encounter.    Disposition:   FU with Dr. Excell Seltzer in 4 months. I will see back in 8 months. Overall, he seems to be holding his own.   Patient is agreeable to this plan and will call if any problems develop in the interim.   SignedNorma Fredrickson, NP  07/14/2017 11:22 AM  [facility] [address] [place], [place]  [address] Phone: [phone] Fax: [phone]

## 2017-07-14 NOTE — Patient Instructions (Addendum)
We will be checking the following labs today - NONE  We will call Dr. Silvano Rusk office and get your recent labs   Medication Instructions:    Continue with your current medicines.     Testing/Procedures To Be Arranged:  N/A  Follow-Up:   See Dr. Excell Seltzer in 4 months  See me in 8 months    Other Special Instructions:   N/A    If you need a refill on your cardiac medications before your next appointment, please call your pharmacy.   Call the [facility] office at [phone] if you have any questions, problems or concerns.

## 2017-08-16 ENCOUNTER — Other Ambulatory Visit: Payer: Self-pay | Admitting: Nurse Practitioner

## 2017-08-16 ENCOUNTER — Other Ambulatory Visit: Payer: Self-pay | Admitting: Cardiovascular Disease

## 2017-08-24 DIAGNOSIS — Z08 Encounter for follow-up examination after completed treatment for malignant neoplasm: Secondary | ICD-10-CM | POA: Diagnosis not present

## 2017-08-24 DIAGNOSIS — Z85828 Personal history of other malignant neoplasm of skin: Secondary | ICD-10-CM | POA: Diagnosis not present

## 2017-08-24 DIAGNOSIS — H61001 Unspecified perichondritis of right external ear: Secondary | ICD-10-CM | POA: Diagnosis not present

## 2017-09-21 DIAGNOSIS — H61001 Unspecified perichondritis of right external ear: Secondary | ICD-10-CM | POA: Diagnosis not present

## 2017-09-21 DIAGNOSIS — X32XXXD Exposure to sunlight, subsequent encounter: Secondary | ICD-10-CM | POA: Diagnosis not present

## 2017-09-21 DIAGNOSIS — Z85828 Personal history of other malignant neoplasm of skin: Secondary | ICD-10-CM | POA: Diagnosis not present

## 2017-09-21 DIAGNOSIS — Z08 Encounter for follow-up examination after completed treatment for malignant neoplasm: Secondary | ICD-10-CM | POA: Diagnosis not present

## 2017-09-21 DIAGNOSIS — L570 Actinic keratosis: Secondary | ICD-10-CM | POA: Diagnosis not present

## 2017-11-01 ENCOUNTER — Ambulatory Visit: Payer: Medicare PPO | Admitting: Cardiovascular Disease

## 2017-11-01 ENCOUNTER — Encounter: Payer: Self-pay | Admitting: Cardiovascular Disease

## 2017-11-01 VITALS — BP 132/74 | HR 73 | Ht 66.0 in | Wt 135.5 lb

## 2017-11-01 DIAGNOSIS — I5022 Chronic systolic (congestive) heart failure: Secondary | ICD-10-CM | POA: Diagnosis not present

## 2017-11-01 DIAGNOSIS — I472 Ventricular tachycardia, unspecified: Secondary | ICD-10-CM

## 2017-11-01 NOTE — Progress Notes (Signed)
Cardiology Office Note Date:  11/01/2017   ID:  Karl Stevenson, DOB 11/24/1926, MRN [id number]  PCP:  Jarome Matin, MD  Cardiologist:  [name], MD    Chief Complaint  Patient presents with  . Follow-up    CAD     History of Present Illness: Karl Stevenson is a 84 y.o. male who presents for follow-up evaluation.  The patient has a history of coronary artery disease with redo CABG in 2008.  He has chronic systolic heart failure with the severe ischemic cardiomyopathy LVEF approximately 30 to 35%.  Multiple comorbid conditions include peripheral arterial disease, hypertension, carotid stenosis with history of left carotid endarterectomy, abdominal aortic aneurysm, and ventricular tachycardia.  He is followed by Dr. Graciela Husbands and is managed with mexiletine and Ranexa.  Amiodarone was discontinued because of bradycardia.  The patient is here alone today.  He is doing remarkably well.  He ambulates with a cane and tries to stay as active as he can.  He walks regularly.  He takes his medications at a fixed time every day.  He denies any recent heart palpitations, chest pain, shortness of breath, leg swelling, lightheadedness, or syncope.   Past Medical History:  Diagnosis Date  . AAA (abdominal aortic aneurysm) (HCC)    a. Ectatic abdominal aorta with fusiform dilitation 3.4x3.4 and moderate thrombus burden 12/2010  . Bradycardia    a. previous intolerance to beta blockers - low dose toprol started 10/2011 for tachycardia but had rash and was discontinued;  b. tolerating low dose propranolol.  . Carotid artery occlusion    a. s/p L CEA in 2010;  b. 06/2014 Carotid U/S: stable LICA CEA site w/o significant stenosis bilat.  . Chronic systolic heart failure (HCC)    a. 02/2015 Echo:  35-40% (echo 11/2010); c. 02/2015 Echo: EF 20-25%, diff HK, mid-apicalanteroseptal and apical AK, Gr 1 DD, mild MR.  [name] COPD (chronic obstructive pulmonary disease) (HCC)   . Coronary artery disease    a.  1969 s/p MI;  b. S/P CABG 1987;  c. S/P redo 2008;  d. 07/2012 Cath: stable->Med Rx, EF 40%; e. 02/2014 MV: anterior and posterolateral/apical infarct with mild peri-infarct ischemia->Med Rx; f. 02/2015 Cath: LM 100d, LAD 100ost, LCX 100ost, OM2 100, VG->OM3->OM4 nl, VG-> LAD nl (Y from VG->OM), VG->OM2 100p.  . Crohn's disease (HCC)    a. s/p colon resection  . Gout   . Hyperlipidemia   . Hypertension   . Ischemic cardiomyopathy    a. EF 34% (Lexiscan 06/2011); b. 35-40% (echo 11/2010); c. 02/2015 Echo: EF 20-25%, diff HK, mid-apicalanteroseptal and apical AK, Gr 1 DD, mild MR.  . Peripheral vascular disease (HCC)    a. Carotid dz s/p L CEA, RAS, AAA, LE dz  . RBBB   . Renal artery stenosis (HCC)    a. Dopplers 12/2010 - 1-59% R ostial renal artery stenosis, 2 L renal arteries both widely patent  . Syncope   . Tachycardia    a. 10/2011 - atrial tach vs avnrt - BB started.  . Ventricular tachycardia (HCC)    a. 02/2015 ->amio 200 daily added-->later switched to mexilitene.    Past Surgical History:  Procedure Laterality Date  . ABDOMINAL AORTAGRAM N/A 06/11/2011   Procedure: ABDOMINAL [name];  Surgeon: [name], MD;  Location: [facility] CATH LAB;  Service: Cardiovascular;  Laterality: N/A;  . CARDIAC CATHETERIZATION  07/28/2012   Native 3v CAD, continued patency of the SVG-OM1-LAD and SVG-OM2-left PDA. LVEF 40%  with multiple WMAs  . CARDIAC CATHETERIZATION N/A 03/08/2015   Procedure: Left Heart Cath and Coronary Angiography;  Surgeon: [name], MD;  Location: [facility] INVASIVE CV LAB;  Service: Cardiovascular;  Laterality: N/A;  . CAROTID ENDARTERECTOMY Left 2010  . CATARACT EXTRACTION, BILATERAL    . COLON RESECTION  2008  . COLON SURGERY    . CORONARY ANGIOPLASTY    . CORONARY ARTERY BYPASS GRAFT  10/31/85 & 08/19/06  . ENDARTERECTOMY  08/26/2011   Procedure: ENDARTERECTOMY ILIAC;  Surgeon: [name], MD;  Location: [facility] OR;  Service: Vascular;  Laterality: Right;  Right Femoral Artery  Endarterectomy with vascu guard patch angioplasty & intraoperative arteriogram.  . EYE SURGERY    . FEMORAL ENDARTERECTOMY Left  06/2011   ileofemoral endarterectomy with bovine patch angioplasty  . LEFT HEART CATHETERIZATION WITH CORONARY ANGIOGRAM N/A 07/28/2012   Procedure: LEFT HEART CATHETERIZATION WITH CORONARY ANGIOGRAM;  Surgeon: [name], MD;  Location: [facility] CATH LAB;  Service: Cardiovascular;  Laterality: N/A;  . PR VEIN BYPASS GRAFT,AORTO-FEM-POP  10/1985  . TONSILLECTOMY     as a child    Current Outpatient Medications  Medication Sig Dispense Refill  . acetaminophen (TYLENOL) 500 MG tablet Take 1,000 mg by mouth every 6 (six) hours as needed (pain).     [name] aspirin EC 81 MG tablet Take 81 mg by mouth daily.    [name] atorvastatin (LIPITOR) 10 MG tablet Take 10 mg by mouth at bedtime.     . carboxymethylcellulose (REFRESH TEARS) 0.5 % SOLN Place 1 drop into both eyes 4 (four) times daily.    . clopidogrel (PLAVIX) 75 MG tablet TAKE 1 TABLET BY MOUTH ONCE DAILY 90 tablet 2  . ipratropium (ATROVENT) 0.03 % nasal spray Place 1 spray into both nostrils daily.     . isosorbide mononitrate (IMDUR) 120 MG 24 hr tablet TAKE 1 TABLET BY MOUTH ONCE DAILY 90 tablet 2  . isosorbide mononitrate (IMDUR) 60 MG 24 hr tablet TAKE 1 TABLET BY MOUTH ONCE DAILY IN ADDITION TO ISOSORBIDE 120MG  TABLETS DAILY 90 tablet 1  . Lactase (LACTAID PO) Take 1 tablet by mouth 2 (two) times daily.    [name] losartan (COZAAR) 25 MG tablet TAKE 1 TABLET BY MOUTH ONCE DAILY 90 tablet 2  . mexiletine (MEXITIL) 200 MG capsule TAKE ONE CAPSULE BY MOUTH EVERY 12 HOURS 180 capsule 3  . Multiple Vitamin (MULTIVITAMIN WITH MINERALS) TABS tablet Take 2 tablets by mouth daily.    . nitroGLYCERIN (NITROSTAT) 0.4 MG SL tablet Place 0.4 mg under the tongue every 5 (five) minutes as needed for chest pain.    . pantoprazole (PROTONIX) 40 MG tablet TAKE ONE TABLET BY MOUTH ONCE DAILY. 90 tablet 3  . polyethylene glycol powder  (GLYCOLAX/MIRALAX) powder Take 17 g by mouth at bedtime. Mix in 8 oz liquid and drink    . RANEXA 500 MG 12 hr tablet TAKE 2 TABLETS BY MOUTH TWICE DAILY 360 tablet 3  . vitamin B-12 (CYANOCOBALAMIN) 1000 MCG tablet Take 2,000 mcg by mouth daily.     No current facility-administered medications for this visit.     Allergies:   Amoxicillin; Novocain [procaine hcl]; Procaine; and Metoprolol   Social History:  The patient  reports that he has never smoked. He has never used smokeless tobacco. He reports that he does not drink alcohol or use drugs.   Family History:  The patient's family history includes CAD in his brother; COPD in his brother and mother;  Cancer in his mother; Deep vein thrombosis in his brother; Heart disease in his father.   ROS:  Please see the history of present illness.  All other systems are reviewed and negative.   PHYSICAL EXAM: VS:  BP 132/74   Pulse 73   Ht 5\' 6"  (1.676 m)   Wt 135 lb 8 oz (61.5 kg)   SpO2 98%   BMI 21.87 kg/m  , BMI Body mass index is 21.87 kg/m. GEN: Well nourished, well developed, in no acute distress  HEENT: normal  Neck: no JVD, no masses. BL carotid bruits Cardiac: RRR without murmur or gallop      Respiratory:  clear to auscultation bilaterally, normal work of breathing GI: soft, nontender, nondistended, + BS MS: no deformity or atrophy  Ext: no pretibial edema Skin: warm and dry, no rash Neuro:  Strength and sensation are intact Psych: euthymic mood, full affect  EKG:  EKG is ordered today. The ekg ordered today shows sinus rhythm 67 bpm with first-degree AV block. right bundle branch block, left anterior fascicular block.  Recent Labs: No results found for requested labs within last 8760 hours.   Lipid Panel     Component Value Date/Time   CHOL 122 (L) 03/03/2016 1242   TRIG 174 (H) 03/03/2016 1242   HDL 39 (L) 03/03/2016 1242   CHOLHDL 3.1 03/03/2016 1242   VLDL 35 (H) 03/03/2016 1242   LDLCALC 48 03/03/2016 1242        Wt Readings from Last 3 Encounters:  11/01/17 135 lb 8 oz (61.5 kg)  07/14/17 135 lb 12.8 oz (61.6 kg)  04/30/17 132 lb (59.9 kg)     ASSESSMENT AND PLAN: 1.  Coronary artery disease, native vessel, with angina: He appears stable on his current regimen which includes a high dose of isosorbide.  No changes are made today.  2.  Chronic systolic heart failure: Medical therapy limited by bradycardia and significant conduction disease.  He will continue on his current regimen which includes a low-dose of losartan as well as isosorbide.  3.  Paroxysmal ventricular tachycardia: Appears to be stable and doing well on a combination of mexiletine and Ranexa.  Ranexa cost is an issue and work has been done to get him approved for mexiletine.  These are obviously critical medications for him without good alternatives.  4.  Hypertension: Blood pressure is well controlled.  5.  Hyperlipidemia: Treated with atorvastatin with most recent lipids reviewed  6.  Chronic kidney disease stage III: Most recent creatinine 1.3 mg/dL, treated with losartan.  Current medicines are reviewed with the patient today.  The patient does not have concerns regarding medicines.  Labs/ tests ordered today include:   Orders Placed This Encounter  Procedures  . EKG 12-Lead   Disposition:   FU as planned with Norma Fredrickson, NP  Signed, [name], MD  11/01/2017 1:35 PM    [facility] [address] [place], [place], [place]  [address] Phone: [phone]; Fax: [phone]

## 2017-11-01 NOTE — Patient Instructions (Signed)
Medication Instructions:  Your provider recommends that you continue on your current medications as directed. Please refer to the Current Medication list given to you today.    Labwork: Done  Testing/Procedures: None  Follow-Up: Please keep our appointment as scheduled with Norma Fredrickson, NP.  Any Other Special Instructions Will Be Listed Below (If Applicable).     If you need a refill on your cardiac medications before your next appointment, please call your pharmacy.

## 2017-11-24 ENCOUNTER — Telehealth: Payer: Self-pay | Admitting: Nurse Practitioner

## 2017-11-24 NOTE — Telephone Encounter (Signed)
   Pts wife called this evening reporting that since about 5p, pts BP has been 190's to 200's.  He is asymptomatic.  He takes losartan 25 mg daily, and took this this AM.   I rec that he take an additional 25mg  now and repeat BP in ~ 1 hr.  If systolic still > 132, they should call back for advice.  If BP stable tonight but up in AM, I did ask them to call back into office so that we can formally increase losartan dose to 50 mg daily.  Caller verbalized understanding and was grateful for the call back.  Nicolasa Ducking, NP 11/24/2017, 7:08 PM

## 2017-11-25 ENCOUNTER — Telehealth: Payer: Self-pay | Admitting: Cardiovascular Disease

## 2017-11-25 MED ORDER — LOSARTAN POTASSIUM 50 MG PO TABS: 50.0000 mg | ORAL_TABLET | Freq: Every day | ORAL | 3 refills | Status: DC

## 2017-11-25 NOTE — Telephone Encounter (Signed)
Noted  

## 2017-11-25 NOTE — Telephone Encounter (Signed)
New Message    Pt c/o BP issue:  1. What are your last 5 BP readings? 166/88 2. Are you having any other symptoms (ex. Dizziness, headache, blurred vision, passed out)? Chest pains  3. What is your medication issue? No    Pt c/o of Chest Pain: STAT if CP now or developed within 24 hours  1. Are you having CP right now? Yes, having chest pain   2. Are you experiencing any other symptoms (ex. SOB, nausea, vomiting, sweating)? No   3. How long have you been experiencing CP?   4. Is your CP continuous or coming and going? Continuous this morning  5. Have you taken Nitroglycerin? Yes  ?

## 2017-11-25 NOTE — Telephone Encounter (Signed)
Pt called back to office.    Pt had called yesterday in regards to asymptomatic high blood pressures.  Pt had been advised to take an extra losartan and to call office today if continued elevated pressures.  Today Pt BP is 166/88.  Pt states he has had chest "soreness tightness" over the last couple of days.  Pt has history of CAD with angina for which he takes high dose Imdur and Ranexa.  Pt states he took 1 nitro yesterday morning with no relief and then took 2 nitros yesterday evening with some relief.  This nurse advised Pt go to ER for further eval.  Pt does not want to go to ER, would like to be seen in office.  Pt states he "is feeling fine" and is just worried about his BP.  Advised Pt per note from yesterday with APP Pt can increase his losartan to 50 mg daily.  Pt very happy with that information.  Made appointment with [facility] for next week for assessment.  Pt indicates understanding and thanked nurse for call.  Advised if Pt had increasing chest pain or SOB he should not hesitate to go to ER.  Pt indicates understanding.

## 2017-11-25 NOTE — Telephone Encounter (Signed)
No further action at this time.

## 2017-11-30 ENCOUNTER — Encounter: Payer: Self-pay | Admitting: Nurse Practitioner

## 2017-11-30 ENCOUNTER — Ambulatory Visit: Payer: Medicare PPO | Admitting: Nurse Practitioner

## 2017-11-30 ENCOUNTER — Telehealth: Payer: Self-pay | Admitting: Nurse Practitioner

## 2017-11-30 VITALS — BP 142/78 | HR 78 | Ht 67.0 in | Wt 135.8 lb

## 2017-11-30 DIAGNOSIS — I2510 Atherosclerotic heart disease of native coronary artery without angina pectoris: Secondary | ICD-10-CM

## 2017-11-30 DIAGNOSIS — I10 Essential (primary) hypertension: Secondary | ICD-10-CM

## 2017-11-30 LAB — BASIC METABOLIC PANEL
BUN/Creatinine Ratio: 20 (ref 10–24)
BUN: 30 mg/dL — ABNORMAL HIGH (ref 8–27)
CO2: 21 mmol/L (ref 20–29)
Calcium: 8.9 mg/dL (ref 8.6–10.2)
Chloride: 107 mmol/L — ABNORMAL HIGH (ref 96–106)
Creatinine, Ser: 1.51 mg/dL — ABNORMAL HIGH (ref 0.76–1.27)
GFR calc Af Amer: 47 mL/min/{1.73_m2} — ABNORMAL LOW (ref >59)
GFR calc non Af Amer: 41 mL/min/{1.73_m2} — ABNORMAL LOW (ref >59)
Glucose: 105 mg/dL — ABNORMAL HIGH (ref 65–99)
Potassium: 4.2 mmol/L (ref 3.5–5.2)
Sodium: 143 mmol/L (ref 134–144)

## 2017-11-30 MED ORDER — LOSARTAN POTASSIUM 25 MG PO TABS
25.0000 mg | ORAL_TABLET | Freq: Two times a day (BID) | ORAL | 3 refills | Status: DC
Start: 2017-11-30 — End: 2018-08-04

## 2017-11-30 NOTE — Telephone Encounter (Signed)
New Message      Pharmacy is calling to verify the Losartan. 25 mg 2 x day or is it 50 mg 1 x a day, pls advise.

## 2017-11-30 NOTE — Telephone Encounter (Signed)
S/w Kristi to clarify losartan (25 mg ) by mouth twice a day.

## 2017-11-30 NOTE — Progress Notes (Signed)
CARDIOLOGY OFFICE NOTE  Date:  11/30/2017    Karl Stevenson Date of Birth: 03/21/1927 Medical Record #[id number]  PCP:  Jarome Matin, MD  Cardiologist:  Kirt Boys    Chief Complaint  Patient presents with  . Chest Pain    Work in visit - seen for Dr. Excell Seltzer     History of Present Illness: Karl Stevenson is a 84 y.o. male who presents today for a work in visit. Seen for Dr. Excell Seltzer.   He has a history of coronary artery disease with redo CABG in 2008.  He has chronic systolic heart failure with the severe ischemic cardiomyopathy with LVEF approximately 30 to 35%.  Multiple comorbid conditions include peripheral arterial disease, hypertension, carotid stenosis with history of left carotid endarterectomy, abdominal aortic aneurysm, and ventricular tachycardia.  He is followed by Dr. Graciela Husbands and is managed with mexiletine and Ranexa.  Amiodarone was discontinued because of bradycardia.  I last saw him back in March - he was doing pretty well.   Saw Dr. Excell Seltzer in June and continued to be doing ok. No real chest pain. BP was doing ok. He monitors frequently at home.   Phone call last week during the evening - BP was up - no real trigger. He had some ? associated chest discomfort. ARB was increased. He had called EMS the next morning - "to get checked out". He did not feel the need to go to the ER. He did use some NTG which brought his BP down.   Comes in today. Here with his son Gery Pray today. Has stopped driving. Has done ok since Thursday last week. BP has improved. He has really not had much in the way of chest pain in talking with him today. No real trigger - his readings from home show elevation in his BP prior to the med increase but there are no dates. Gery Pray wonders if the trigger was that he and his wife had cleaned out the office at their house and this seemed to upset Olive which in turn upset Mr. Swingler. Regardless, doing much better now.    Past Medical History:    Diagnosis Date  . AAA (abdominal aortic aneurysm) (HCC)    a. Ectatic abdominal aorta with fusiform dilitation 3.4x3.4 and moderate thrombus burden 12/2010  . Bradycardia    a. previous intolerance to beta blockers - low dose toprol started 10/2011 for tachycardia but had rash and was discontinued;  b. tolerating low dose propranolol.  . Carotid artery occlusion    a. s/p L CEA in 2010;  b. 06/2014 Carotid U/S: stable LICA CEA site w/o significant stenosis bilat.  . Chronic systolic heart failure (HCC)    a. 02/2015 Echo:  35-40% (echo 11/2010); c. 02/2015 Echo: EF 20-25%, diff HK, mid-apicalanteroseptal and apical AK, Gr 1 DD, mild MR.  [name] COPD (chronic obstructive pulmonary disease) (HCC)   . Coronary artery disease    a. 1969 s/p MI;  b. S/P CABG 1987;  c. S/P redo 2008;  d. 07/2012 Cath: stable->Med Rx, EF 40%; e. 02/2014 MV: anterior and posterolateral/apical infarct with mild peri-infarct ischemia->Med Rx; f. 02/2015 Cath: LM 100d, LAD 100ost, LCX 100ost, OM2 100, VG->OM3->OM4 nl, VG-> LAD nl (Y from VG->OM), VG->OM2 100p.  . Crohn's disease (HCC)    a. s/p colon resection  . Gout   . Hyperlipidemia   . Hypertension   . Ischemic cardiomyopathy    a. EF 34% (Lexiscan 06/2011); b. 35-40% (  echo 11/2010); c. 02/2015 Echo: EF 20-25%, diff HK, mid-apicalanteroseptal and apical AK, Gr 1 DD, mild MR.  . Peripheral vascular disease (HCC)    a. Carotid dz s/p L CEA, RAS, AAA, LE dz  . RBBB   . Renal artery stenosis (HCC)    a. Dopplers 12/2010 - 1-59% R ostial renal artery stenosis, 2 L renal arteries both widely patent  . Syncope   . Tachycardia    a. 10/2011 - atrial tach vs avnrt - BB started.  . Ventricular tachycardia (HCC)    a. 02/2015 ->amio 200 daily added-->later switched to mexilitene.    Past Surgical History:  Procedure Laterality Date  . ABDOMINAL AORTAGRAM N/A 06/11/2011   Procedure: ABDOMINAL [name];  Surgeon: [name], MD;  Location: [facility] CATH LAB;  Service: Cardiovascular;   Laterality: N/A;  . CARDIAC CATHETERIZATION  07/28/2012   Native 3v CAD, continued patency of the SVG-OM1-LAD and SVG-OM2-left PDA. LVEF 40% with multiple WMAs  . CARDIAC CATHETERIZATION N/A 03/08/2015   Procedure: Left Heart Cath and Coronary Angiography;  Surgeon: [name], MD;  Location: [facility] INVASIVE CV LAB;  Service: Cardiovascular;  Laterality: N/A;  . CAROTID ENDARTERECTOMY Left 2010  . CATARACT EXTRACTION, BILATERAL    . COLON RESECTION  2008  . COLON SURGERY    . CORONARY ANGIOPLASTY    . CORONARY ARTERY BYPASS GRAFT  10/31/85 & 08/19/06  . ENDARTERECTOMY  08/26/2011   Procedure: ENDARTERECTOMY ILIAC;  Surgeon: [name], MD;  Location: [facility] OR;  Service: Vascular;  Laterality: Right;  Right Femoral Artery Endarterectomy with vascu guard patch angioplasty & intraoperative arteriogram.  . EYE SURGERY    . FEMORAL ENDARTERECTOMY Left  06/2011   ileofemoral endarterectomy with bovine patch angioplasty  . LEFT HEART CATHETERIZATION WITH CORONARY ANGIOGRAM N/A 07/28/2012   Procedure: LEFT HEART CATHETERIZATION WITH CORONARY ANGIOGRAM;  Surgeon: [name], MD;  Location: [facility] CATH LAB;  Service: Cardiovascular;  Laterality: N/A;  . PR VEIN BYPASS GRAFT,AORTO-FEM-POP  10/1985  . TONSILLECTOMY     as a child     Medications: Current Meds  Medication Sig  . acetaminophen (TYLENOL) 500 MG tablet Take 1,000 mg by mouth every 6 (six) hours as needed (pain).   [name] aspirin EC 81 MG tablet Take 81 mg by mouth daily.  [name] atorvastatin (LIPITOR) 10 MG tablet Take 10 mg by mouth at bedtime.   . carboxymethylcellulose (REFRESH TEARS) 0.5 % SOLN Place 1 drop into both eyes 4 (four) times daily.  . clopidogrel (PLAVIX) 75 MG tablet TAKE 1 TABLET BY MOUTH ONCE DAILY  . ipratropium (ATROVENT) 0.03 % nasal spray Place 1 spray into both nostrils daily.   . isosorbide mononitrate (IMDUR) 120 MG 24 hr tablet TAKE 1 TABLET BY MOUTH ONCE DAILY  . isosorbide mononitrate (IMDUR) 60 MG 24 hr tablet TAKE 1  TABLET BY MOUTH ONCE DAILY IN ADDITION TO ISOSORBIDE 120MG  TABLETS DAILY  . Lactase (LACTAID PO) Take 1 tablet by mouth 2 (two) times daily.  [name] losartan (COZAAR) 25 MG tablet Take 1 tablet (25 mg total) by mouth 2 (two) times daily.  [name] mexiletine (MEXITIL) 200 MG capsule TAKE ONE CAPSULE BY MOUTH EVERY 12 HOURS  . Multiple Vitamin (MULTIVITAMIN WITH MINERALS) TABS tablet Take 2 tablets by mouth daily.  . nitroGLYCERIN (NITROSTAT) 0.4 MG SL tablet Place 0.4 mg under the tongue every 5 (five) minutes as needed for chest pain.  . pantoprazole (PROTONIX) 40 MG tablet TAKE ONE TABLET BY MOUTH  ONCE DAILY.  [name] polyethylene glycol powder (GLYCOLAX/MIRALAX) powder Take 17 g by mouth at bedtime. Mix in 8 oz liquid and drink  . RANEXA 500 MG 12 hr tablet TAKE 2 TABLETS BY MOUTH TWICE DAILY  . vitamin B-12 (CYANOCOBALAMIN) 1000 MCG tablet Take 2,000 mcg by mouth daily.  . [DISCONTINUED] losartan (COZAAR) 25 MG tablet Take 25 mg by mouth 2 (two) times daily.     Allergies: Allergies  Allergen Reactions  . Amoxicillin     diaharrea  . Novocain [Procaine Hcl] Other (See Comments)    Cold sweats and very bad headache.   . Procaine Other (See Comments)    Reaction not recalled by the patient  . Metoprolol Hives, Itching and Rash    Social History: The patient  reports that he has never smoked. He has never used smokeless tobacco. He reports that he does not drink alcohol or use drugs.   Family History: The patient's family history includes CAD in his brother; COPD in his brother and mother; Cancer in his mother; Deep vein thrombosis in his brother; Heart disease in his father.   Review of Systems: Please see the history of present illness.   Otherwise, the review of systems is positive for none.   All other systems are reviewed and negative.   Physical Exam: VS:  BP (!) 142/78 (BP Location: Left Arm, Patient Position: Sitting, Cuff Size: Normal)   Pulse 78   Ht 5\' 7"  (1.702 m)   Wt 135 lb 12.8  oz (61.6 kg)   BMI 21.27 kg/m  .  BMI Body mass index is 21.27 kg/m.  Wt Readings from Last 3 Encounters:  11/30/17 135 lb 12.8 oz (61.6 kg)  11/01/17 135 lb 8 oz (61.5 kg)  07/14/17 135 lb 12.8 oz (61.6 kg)    General: Pleasant. Elderly. Alert and in no acute distress.   HEENT: Normal.  Neck: Supple, no JVD, carotid bruits, or masses noted.  Cardiac: Regular rate and rhythm. Soft murmur noted.  No edema.  Respiratory:  Lungs are clear to auscultation bilaterally with normal work of breathing.  GI: Soft and nontender.  MS: No deformity or atrophy. Gait and ROM intact.  Skin: Warm and dry. Color is normal.  Neuro:  Strength and sensation are intact and no gross focal deficits noted.  Psych: Alert, appropriate and with normal affect.   LABORATORY DATA:  EKG:  EKG is ordered today. This demonstrates NSR with bifascicular block.  Lab Results  Component Value Date   WBC 6.1 08/19/2016   HGB 11.7 (L) 08/19/2016   HCT 35.4 (L) 08/19/2016   PLT 140 (L) 08/19/2016   GLUCOSE 116 (H) 08/19/2016   CHOL 122 (L) 03/03/2016   TRIG 174 (H) 03/03/2016   HDL 39 (L) 03/03/2016   LDLCALC 48 03/03/2016   ALT 11 03/03/2016   AST 16 03/03/2016   NA 137 08/19/2016   K 4.3 08/19/2016   CL 105 08/19/2016   CREATININE 1.52 (H) 08/19/2016   BUN 25 (H) 08/19/2016   CO2 23 08/19/2016   TSH 2.053 10/24/2015   INR 1.20 07/27/2015       BNP (last 3 results) No results for input(s): BNP in the last 8760 hours.  ProBNP (last 3 results) No results for input(s): PROBNP in the last 8760 hours.   Other Studies Reviewed Today:  Echo Study Conclusions from 11/2015  - Left ventricle: The cavity size was normal. Systolic function was moderately to severely reduced. The estimated ejection  fraction was in the range of 30% to 35%. There is akinesis of the apicalanterior, lateral, inferolateral, inferior, and apical myocardium. There is akinesis of the midanteroseptal  myocardium. There was an increased relative contribution of atrial contraction to ventricular filling. Doppler parameters are consistent with abnormal left ventricular relaxation (grade 1 diastolic dysfunction). - Aortic valve: Trileaflet; normal thickness, mildly calcified leaflets. - Mitral valve: Calcified annulus. There was mild regurgitation.    Cath 03-08-2015:    Conclusion    Sequential SVG-OM2-OM3 was injected is large, and is anatomically normal.  Y Graft SVG-LAD from SVG-OM is large, and is anatomically normal. The downstream LAD is small caliber and diffusely diseased.  LM lesion, 100% stenosed.  Ost LAD to Prox LAD lesion, 100% stenosed.  Ost 2nd Mrg to 2nd Mrg lesion, 100% stenosed.  SVG was injected is small. JR4 Catheter  Prox Original SVG-OM1 100% stenosed.  Ost Cx to Mid Cx lesion, 100% stenosed.  Borderline LOW LVEDP   Impression:  Known severe native coronary disease. Disabled contrast the native arteries were not evaluated.  Occluded old SVG-OM1 - the dye remained standing in the proximal vessel for several minutes following angiography - not PCI amenable ? This is the likely culprit for the patient's episode of what sounds like anginal symptoms about a month ago. This may also be a cause of now worsening ventricular tachycardia.   Widely patent Y graft with one limb providing sequential grafting to OM1 and OM 2 as well as a separate limb serving as a graft to the LAD which retrograde fills the diagonal branch. This redo graft now provides the entirety of the patient's coronary circulation.  The graft obtuse marginal branches are relatively free of disease with retrograde filling back to the distal circumflex system which consists of the posterolateral branch and left PDA.  The grafted LAD itself was a very diffusely diseased vessel, but no change from previous.  Known small nondominant right coronary artery  system.  Borderline low LVEDP   Recommendations:  No PCI targets.  Standard post radial cath care with TR band removal  Would proceed with ICD placement per EP    Assessment/Plan:  1.  HTN - recent exacerbation in his readings - ?trigger was the change in routine at his house - BP now better - will stay on current regimen - may need to cut the ARB back to QD if BP stays below 120 systolic. BMET today. I will see back as planned later this year.   2. Coronary artery disease, native vessel, with angina: He is not really having chest pain - I have left him on his current regimen.   3.  Chronic systolic heart failure: Medical therapy limited by bradycardia and significant conduction disease.  He is doing well - no changes made today.   4.  Paroxysmal ventricular tachycardia: He remains on a combination of mexiletine and Ranexa.  Ranexa cost is an issue and work has been done to get him approved for mexiletine.  These are obviously critical medications for him without good alternatives. Not changed today.   5.  Hyperlipidemia: remains on statin therapy  6.  Chronic kidney disease stage III: rechecking BMET today with the increase in ARB.    Current medicines are reviewed with the patient today.  The patient does not have concerns regarding medicines other than what has been noted above.  The following changes have been made:  See above.  Labs/ tests ordered today include:    Orders  Placed This Encounter  Procedures  . Basic metabolic panel  . EKG 12-Lead     Disposition:   FU with me as planned later this year.    Patient is agreeable to this plan and will call if any problems develop in the interim.   SignedNorma Fredrickson, NP  11/30/2017 10:52 AM  [facility] [address] [place], [place]  [address] Phone: [phone] Fax: [phone]

## 2017-11-30 NOTE — Patient Instructions (Addendum)
We will be checking the following labs today - BMET    Medication Instructions:    Continue with your current medicines.   We will stay on the twice a day Losartan - I sent in a refill for you    Testing/Procedures To Be Arranged:  N/A  Follow-Up:   See me as planned in November    Other Special Instructions:   Keep a check on your BP - if it starts staying low - we will go back to just once a day Losartan    If you need a refill on your cardiac medications before your next appointment, please call your pharmacy.   Call the [facility] office at [phone] if you have any questions, problems or concerns.

## 2017-12-14 DIAGNOSIS — Z961 Presence of intraocular lens: Secondary | ICD-10-CM | POA: Diagnosis not present

## 2017-12-14 DIAGNOSIS — H18413 Arcus senilis, bilateral: Secondary | ICD-10-CM | POA: Diagnosis not present

## 2017-12-14 DIAGNOSIS — H26491 Other secondary cataract, right eye: Secondary | ICD-10-CM | POA: Diagnosis not present

## 2017-12-14 DIAGNOSIS — H53001 Unspecified amblyopia, right eye: Secondary | ICD-10-CM | POA: Diagnosis not present

## 2017-12-14 DIAGNOSIS — H02839 Dermatochalasis of unspecified eye, unspecified eyelid: Secondary | ICD-10-CM | POA: Diagnosis not present

## 2017-12-14 DIAGNOSIS — H04123 Dry eye syndrome of bilateral lacrimal glands: Secondary | ICD-10-CM | POA: Diagnosis not present

## 2017-12-14 DIAGNOSIS — I10 Essential (primary) hypertension: Secondary | ICD-10-CM | POA: Diagnosis not present

## 2017-12-17 ENCOUNTER — Other Ambulatory Visit: Payer: Self-pay | Admitting: Cardiovascular Disease

## 2018-02-11 ENCOUNTER — Other Ambulatory Visit: Payer: Self-pay | Admitting: Nurse Practitioner

## 2018-02-11 MED ORDER — ISOSORBIDE MONONITRATE ER 120 MG PO TB24: 120.0000 mg | ORAL_TABLET | Freq: Every day | ORAL | 2 refills | Status: DC

## 2018-02-12 DIAGNOSIS — Z23 Encounter for immunization: Secondary | ICD-10-CM | POA: Diagnosis not present

## 2018-03-16 ENCOUNTER — Other Ambulatory Visit: Payer: Self-pay | Admitting: Internal Medicine

## 2018-03-22 ENCOUNTER — Ambulatory Visit: Payer: Medicare PPO | Admitting: Nurse Practitioner

## 2018-03-22 NOTE — Progress Notes (Deleted)
CARDIOLOGY OFFICE NOTE  Date:  03/22/2018    Karl Stevenson Date of Birth: 1926-12-24 Medical Record #[id number]  PCP:  Jarome Matin, MD  Cardiologist:  Tyrone Sage & ***    No chief complaint on file.   History of Present Illness: Karl Stevenson is a 84 y.o. male who presents today for a ***  Seen for Dr. Excell Seltzer.   He has a history of coronary artery disease with redo CABG in 2008. He has chronic systolic heart failure with the severe ischemic cardiomyopathy with LVEF approximately 30 to 35%. Multiple comorbid conditions include peripheral arterial disease, hypertension, carotid stenosis with history of left carotid endarterectomy, abdominal aortic aneurysm, and ventricular tachycardia. He is followed by Dr. Graciela Husbands and is managed with mexiletine and Ranexa. Amiodarone was discontinued because of bradycardia.  I last saw him back in March - he was doing pretty well.   Saw Dr. Excell Seltzer in June and continued to be doing ok. No real chest pain. BP was doing ok. He monitors frequently at home.   Phone call last week during the evening - BP was up - no real trigger. He had some ? associated chest discomfort. ARB was increased. He had called EMS the next morning - "to get checked out". He did not feel the need to go to the ER. He did use some NTG which brought his BP down.   Comes in today. Here with his son Karl Stevenson today. Has stopped driving. Has done ok since Thursday last week. BP has improved. He has really not had much in the way of chest pain in talking with him today. No real trigger - his readings from home show elevation in his BP prior to the med increase but there are no dates. Karl Stevenson wonders if the trigger was that he and his wife had cleaned out the office at their house and this seemed to upset Karl which in turn upset Mr. Stevenson. Regardless, doing much better now.    Comes in today. Here with   Past Medical History:  Diagnosis Date  . AAA (abdominal aortic  aneurysm) (HCC)    a. Ectatic abdominal aorta with fusiform dilitation 3.4x3.4 and moderate thrombus burden 12/2010  . Bradycardia    a. previous intolerance to beta blockers - low dose toprol started 10/2011 for tachycardia but had rash and was discontinued;  b. tolerating low dose propranolol.  . Carotid artery occlusion    a. s/p L CEA in 2010;  b. 06/2014 Carotid U/S: stable LICA CEA site w/o significant stenosis bilat.  . Chronic systolic heart failure (HCC)    a. 02/2015 Echo:  35-40% (echo 11/2010); c. 02/2015 Echo: EF 20-25%, diff HK, mid-apicalanteroseptal and apical AK, Gr 1 DD, mild MR.  [name] COPD (chronic obstructive pulmonary disease) (HCC)   . Coronary artery disease    a. 1969 s/p MI;  b. S/P CABG 1987;  c. S/P redo 2008;  d. 07/2012 Cath: stable->Med Rx, EF 40%; e. 02/2014 MV: anterior and posterolateral/apical infarct with mild peri-infarct ischemia->Med Rx; f. 02/2015 Cath: LM 100d, LAD 100ost, LCX 100ost, OM2 100, VG->OM3->OM4 nl, VG-> LAD nl (Y from VG->OM), VG->OM2 100p.  . Crohn's disease (HCC)    a. s/p colon resection  . Gout   . Hyperlipidemia   . Hypertension   . Ischemic cardiomyopathy    a. EF 34% (Lexiscan 06/2011); b. 35-40% (echo 11/2010); c. 02/2015 Echo: EF 20-25%, diff HK, mid-apicalanteroseptal and apical AK, Gr 1 DD, mild  MR.  . Peripheral vascular disease (HCC)    a. Carotid dz s/p L CEA, RAS, AAA, LE dz  . RBBB   . Renal artery stenosis (HCC)    a. Dopplers 12/2010 - 1-59% R ostial renal artery stenosis, 2 L renal arteries both widely patent  . Syncope   . Tachycardia    a. 10/2011 - atrial tach vs avnrt - BB started.  . Ventricular tachycardia (HCC)    a. 02/2015 ->amio 200 daily added-->later switched to mexilitene.    Past Surgical History:  Procedure Laterality Date  . ABDOMINAL AORTAGRAM N/A 06/11/2011   Procedure: ABDOMINAL [name];  Surgeon: [name], MD;  Location: [facility] CATH LAB;  Service: Cardiovascular;  Laterality: N/A;  . CARDIAC  CATHETERIZATION  07/28/2012   Native 3v CAD, continued patency of the SVG-OM1-LAD and SVG-OM2-left PDA. LVEF 40% with multiple WMAs  . CARDIAC CATHETERIZATION N/A 03/08/2015   Procedure: Left Heart Cath and Coronary Angiography;  Surgeon: [name], MD;  Location: [facility] INVASIVE CV LAB;  Service: Cardiovascular;  Laterality: N/A;  . CAROTID ENDARTERECTOMY Left 2010  . CATARACT EXTRACTION, BILATERAL    . COLON RESECTION  2008  . COLON SURGERY    . CORONARY ANGIOPLASTY    . CORONARY ARTERY BYPASS GRAFT  10/31/85 & 08/19/06  . ENDARTERECTOMY  08/26/2011   Procedure: ENDARTERECTOMY ILIAC;  Surgeon: [name], MD;  Location: [facility] OR;  Service: Vascular;  Laterality: Right;  Right Femoral Artery Endarterectomy with vascu guard patch angioplasty & intraoperative arteriogram.  . EYE SURGERY    . FEMORAL ENDARTERECTOMY Left  06/2011   ileofemoral endarterectomy with bovine patch angioplasty  . LEFT HEART CATHETERIZATION WITH CORONARY ANGIOGRAM N/A 07/28/2012   Procedure: LEFT HEART CATHETERIZATION WITH CORONARY ANGIOGRAM;  Surgeon: [name], MD;  Location: [facility] CATH LAB;  Service: Cardiovascular;  Laterality: N/A;  . PR VEIN BYPASS GRAFT,AORTO-FEM-POP  10/1985  . TONSILLECTOMY     as a child     Medications: No outpatient medications have been marked as taking for the 03/22/18 encounter (Appointment) with Rosalio Macadamia, NP.     Allergies: Allergies  Allergen Reactions  . Amoxicillin     diaharrea  . Novocain [Procaine Hcl] Other (See Comments)    Cold sweats and very bad headache.   . Procaine Other (See Comments)    Reaction not recalled by the patient  . Metoprolol Hives, Itching and Rash    Social History: The patient  reports that he has never smoked. He has never used smokeless tobacco. He reports that he does not drink alcohol or use drugs.   Family History: The patient's ***family history includes CAD in his brother; COPD in his brother and mother; Cancer in his mother;  Deep vein thrombosis in his brother; Heart disease in his father.   Review of Systems: Please see the history of present illness.   Otherwise, the review of systems is positive for {NONE DEFAULTED:18576::"none"}.   All other systems are reviewed and negative.   Physical Exam: VS:  There were no vitals taken for this visit. [name]  BMI There is no height or weight on file to calculate BMI.  Wt Readings from Last 3 Encounters:  11/30/17 135 lb 12.8 oz (61.6 kg)  11/01/17 135 lb 8 oz (61.5 kg)  07/14/17 135 lb 12.8 oz (61.6 kg)    General: Pleasant. Well developed, well nourished and in no acute distress.   HEENT: Normal.  Neck: Supple, no JVD, carotid bruits,  or masses noted.  Cardiac: ***Regular rate and rhythm. No murmurs, rubs, or gallops. No edema.  Respiratory:  Lungs are clear to auscultation bilaterally with normal work of breathing.  GI: Soft and nontender.  MS: No deformity or atrophy. Gait and ROM intact.  Skin: Warm and dry. Color is normal.  Neuro:  Strength and sensation are intact and no gross focal deficits noted.  Psych: Alert, appropriate and with normal affect.   LABORATORY DATA:  EKG:  EKG {ACTION; IS/IS VHQ:[id number]} ordered today. This demonstrates ***.  Lab Results  Component Value Date   WBC 6.1 08/19/2016   HGB 11.7 (L) 08/19/2016   HCT 35.4 (L) 08/19/2016   PLT 140 (L) 08/19/2016   GLUCOSE 105 (H) 11/30/2017   CHOL 122 (L) 03/03/2016   TRIG 174 (H) 03/03/2016   HDL 39 (L) 03/03/2016   LDLCALC 48 03/03/2016   ALT 11 03/03/2016   AST 16 03/03/2016   NA 143 11/30/2017   K 4.2 11/30/2017   CL 107 (H) 11/30/2017   CREATININE 1.51 (H) 11/30/2017   BUN 30 (H) 11/30/2017   CO2 21 11/30/2017   TSH 2.053 10/24/2015   INR 1.20 07/27/2015        BNP (last 3 results) No results for input(s): BNP in the last 8760 hours.  ProBNP (last 3 results) No results for input(s): PROBNP in the last 8760 hours.   Other Studies Reviewed  Today:   Assessment/Plan: Echo Study Conclusions from 11/2015  - Left ventricle: The cavity size was normal. Systolic function was moderately to severely reduced. The estimated ejection fraction was in the range of 30% to 35%. There is akinesis of the apicalanterior, lateral, inferolateral, inferior, and apical myocardium. There is akinesis of the midanteroseptal myocardium. There was an increased relative contribution of atrial contraction to ventricular filling. Doppler parameters are consistent with abnormal left ventricular relaxation (grade 1 diastolic dysfunction). - Aortic valve: Trileaflet; normal thickness, mildly calcified leaflets. - Mitral valve: Calcified annulus. There was mild regurgitation.    Cath 03-08-2015:    Conclusion    Sequential SVG-OM2-OM3 was injected is large, and is anatomically normal.  Y Graft SVG-LAD from SVG-OM is large, and is anatomically normal. The downstream LAD is small caliber and diffusely diseased.  LM lesion, 100% stenosed.  Ost LAD to Prox LAD lesion, 100% stenosed.  Ost 2nd Mrg to 2nd Mrg lesion, 100% stenosed.  SVG was injected is small. JR4 Catheter  Prox Original SVG-OM1 100% stenosed.  Ost Cx to Mid Cx lesion, 100% stenosed.  Borderline LOW LVEDP   Impression:  Known severe native coronary disease. Disabled contrast the native arteries were not evaluated.  Occluded old SVG-OM1 - the dye remained standing in the proximal vessel for several minutes following angiography - not PCI amenable ? This is the likely culprit for the patient's episode of what sounds like anginal symptoms about a month ago. This may also be a cause of now worsening ventricular tachycardia.   Widely patent Y graft with one limb providing sequential grafting to OM1 and OM 2 as well as a separate limb serving as a graft to the LAD which retrograde fills the diagonal branch. This redo graft now provides the entirety of  the patient's coronary circulation.  The graft obtuse marginal branches are relatively free of disease with retrograde filling back to the distal circumflex system which consists of the posterolateral branch and left PDA.  The grafted LAD itself was a very diffusely diseased vessel, but no change  from previous.  Known small nondominant right coronary artery system.  Borderline low LVEDP   Recommendations:  No PCI targets.  Standard post radial cath care with TR band removal  Would proceed with ICD placement per EP    Assessment/Plan:  1.HTN - recent exacerbation in his readings - ?trigger was the change in routine at his house - BP now better - will stay on current regimen - may need to cut the ARB back to QD if BP stays below 120 systolic. BMET today. I will see back as planned later this year.   2. Coronary artery disease, native vessel, with angina: He is not really having chest pain - I have left him on his current regimen.   3. Chronic systolic heart failure: Medical therapy limited by bradycardia and significant conduction disease. He is doing well - no changes made today.   4. Paroxysmal ventricular tachycardia: He remains on a combination of mexiletine and Ranexa. Ranexa cost is an issue and work has been done to get him approved for mexiletine. These are obviously critical medications for him without good alternatives. Not changed today.   5. Hyperlipidemia: remains on statin therapy  6. Chronic kidney disease stage III: rechecking BMET today with the increase in ARB.     Current medicines are reviewed with the patient today.  The patient does not have concerns regarding medicines other than what has been noted above.  The following changes have been made:  See above.  Labs/ tests ordered today include:   No orders of the defined types were placed in this encounter.    Disposition:   FU with *** in {gen number 1-47:829562} {Days to  years:10300}.   Patient is agreeable to this plan and will call if any problems develop in the interim.   SignedNorma Fredrickson, NP  03/22/2018 8:12 AM  [facility] [address] [place], [place]  [address] Phone: [phone] Fax: [phone]

## 2018-03-30 ENCOUNTER — Other Ambulatory Visit: Payer: Self-pay | Admitting: Cardiovascular Disease

## 2018-03-30 ENCOUNTER — Encounter: Payer: Self-pay | Admitting: Nurse Practitioner

## 2018-03-30 ENCOUNTER — Ambulatory Visit: Payer: Medicare PPO | Admitting: Nurse Practitioner

## 2018-03-30 VITALS — BP 144/66 | HR 76 | Ht 66.0 in | Wt 139.0 lb

## 2018-03-30 DIAGNOSIS — I472 Ventricular tachycardia, unspecified: Secondary | ICD-10-CM

## 2018-03-30 DIAGNOSIS — I5022 Chronic systolic (congestive) heart failure: Secondary | ICD-10-CM | POA: Diagnosis not present

## 2018-03-30 DIAGNOSIS — I10 Essential (primary) hypertension: Secondary | ICD-10-CM | POA: Diagnosis not present

## 2018-03-30 DIAGNOSIS — I2510 Atherosclerotic heart disease of native coronary artery without angina pectoris: Secondary | ICD-10-CM

## 2018-03-30 DIAGNOSIS — I255 Ischemic cardiomyopathy: Secondary | ICD-10-CM

## 2018-03-30 NOTE — Patient Instructions (Addendum)
We will be checking the following labs today - NONE  Medication Instructions:    Continue with your current medicines.    If you need a refill on your cardiac medications before your next appointment, please call your pharmacy.     Testing/Procedures To Be Arranged:  N/A  Follow-Up:   See me in 4 months.    At [facility], you and your health needs are our priority.  As part of our continuing mission to provide you with exceptional heart care, we have created designated Provider Care Teams.  These Care Teams include your primary Cardiologist (physician) and Advanced Practice Providers (APPs -  Physician Assistants and Nurse Practitioners) who all work together to provide you with the care you need, when you need it.  Special Instructions:  . None  Call the [facility] office at [phone] if you have any questions, problems or concerns.       

## 2018-03-30 NOTE — Progress Notes (Signed)
CARDIOLOGY OFFICE NOTE  Date:  03/30/2018    Karl Stevenson Date of Birth: 05/16/1926 Medical Record #[id number]  PCP:  Jarome Matin, MD  Cardiologist:  Kirt Boys    Chief Complaint  Patient presents with  . Coronary Artery Disease    Follow up visit - seen for Dr. Excell Seltzer    History of Present Illness: Karl Stevenson is a 84 y.o. male who presents today for a follow up visit. Seen for Dr. Excell Seltzer.   He has a history of coronary artery disease with redo CABG in 2008. He has chronic systolic heart failure with the severe ischemic cardiomyopathy with LVEF approximately 30 to 35%. Multiple comorbid conditions include peripheral arterial disease, hypertension, carotid stenosis with history of left carotid endarterectomy, abdominal aortic aneurysm, and ventricular tachycardia. He is followed by Dr. Graciela Husbands and is managed with mexiletine and Ranexa. Amiodarone was discontinued because of bradycardia.  Saw last saw Dr. Excell Seltzer in June and was doing ok. I last saw him back in July - he had stopped driving. Had had a spell of elevated BP. Otherwise was doing ok.  Comes in today. Here with [name] - but she does not come back with him. He feels like he is doing well. No chest pain. Breathing is ok. No fast heart beating. BP diary from home looks pretty good. No falls. He is trying to stay active as best he can.  Tolerating his medicines. Has just turned 89.   Past Medical History:  Diagnosis Date  . AAA (abdominal aortic aneurysm) (HCC)    a. Ectatic abdominal aorta with fusiform dilitation 3.4x3.4 and moderate thrombus burden 12/2010  . Bradycardia    a. previous intolerance to beta blockers - low dose toprol started 10/2011 for tachycardia but had rash and was discontinued;  b. tolerating low dose propranolol.  . Carotid artery occlusion    a. s/p L CEA in 2010;  b. 06/2014 Carotid U/S: stable LICA CEA site w/o significant stenosis bilat.  . Chronic systolic heart  failure (HCC)    a. 02/2015 Echo:  35-40% (echo 11/2010); c. 02/2015 Echo: EF 20-25%, diff HK, mid-apicalanteroseptal and apical AK, Gr 1 DD, mild MR.  [name] COPD (chronic obstructive pulmonary disease) (HCC)   . Coronary artery disease    a. 1969 s/p MI;  b. S/P CABG 1987;  c. S/P redo 2008;  d. 07/2012 Cath: stable->Med Rx, EF 40%; e. 02/2014 MV: anterior and posterolateral/apical infarct with mild peri-infarct ischemia->Med Rx; f. 02/2015 Cath: LM 100d, LAD 100ost, LCX 100ost, OM2 100, VG->OM3->OM4 nl, VG-> LAD nl (Y from VG->OM), VG->OM2 100p.  . Crohn's disease (HCC)    a. s/p colon resection  . Gout   . Hyperlipidemia   . Hypertension   . Ischemic cardiomyopathy    a. EF 34% (Lexiscan 06/2011); b. 35-40% (echo 11/2010); c. 02/2015 Echo: EF 20-25%, diff HK, mid-apicalanteroseptal and apical AK, Gr 1 DD, mild MR.  . Peripheral vascular disease (HCC)    a. Carotid dz s/p L CEA, RAS, AAA, LE dz  . RBBB   . Renal artery stenosis (HCC)    a. Dopplers 12/2010 - 1-59% R ostial renal artery stenosis, 2 L renal arteries both widely patent  . Syncope   . Tachycardia    a. 10/2011 - atrial tach vs avnrt - BB started.  . Ventricular tachycardia (HCC)    a. 02/2015 ->amio 200 daily added-->later switched to mexilitene.    Past Surgical History:  Procedure Laterality Date  . ABDOMINAL AORTAGRAM N/A 06/11/2011   Procedure: ABDOMINAL [name];  Surgeon: [name], MD;  Location: [facility] CATH LAB;  Service: Cardiovascular;  Laterality: N/A;  . CARDIAC CATHETERIZATION  07/28/2012   Native 3v CAD, continued patency of the SVG-OM1-LAD and SVG-OM2-left PDA. LVEF 40% with multiple WMAs  . CARDIAC CATHETERIZATION N/A 03/08/2015   Procedure: Left Heart Cath and Coronary Angiography;  Surgeon: [name], MD;  Location[facility] INVASIVE CV LAB;  Service: Cardiovascular;  Laterality: N/A;  . CAROTID ENDARTERECTOMY Left 2010  . CATARACT EXTRACTION, BILATERAL    . COLON RESECTION  2008  . COLON SURGERY    . CORONARY  ANGIOPLASTY    . CORONARY ARTERY BYPASS GRAFT  10/31/85 & 08/19/06  . ENDARTERECTOMY  08/26/2011   Procedure: ENDARTERECTOMY ILIAC;  Surgeon: [name], MD;  Location: [facility] OR;  Service: Vascular;  Laterality: Right;  Right Femoral Artery Endarterectomy with vascu guard patch angioplasty & intraoperative arteriogram.  . EYE SURGERY    . FEMORAL ENDARTERECTOMY Left  06/2011   ileofemoral endarterectomy with bovine patch angioplasty  . LEFT HEART CATHETERIZATION WITH CORONARY ANGIOGRAM N/A 07/28/2012   Procedure: LEFT HEART CATHETERIZATION WITH CORONARY ANGIOGRAM;  Surgeon: [name], MD;  Location: [facility] CATH LAB;  Service: Cardiovascular;  Laterality: N/A;  . PR VEIN BYPASS GRAFT,AORTO-FEM-POP  10/1985  . TONSILLECTOMY     as a child     Medications: Current Meds  Medication Sig  . acetaminophen (TYLENOL) 500 MG tablet Take 1,000 mg by mouth every 6 (six) hours as needed (pain).   [name] aspirin EC 81 MG tablet Take 81 mg by mouth daily.  [name] atorvastatin (LIPITOR) 10 MG tablet Take 10 mg by mouth at bedtime.   . carboxymethylcellulose (REFRESH TEARS) 0.5 % SOLN Place 1 drop into both eyes 4 (four) times daily.  . clopidogrel (PLAVIX) 75 MG tablet TAKE 1 TABLET BY MOUTH ONCE DAILY  . ipratropium (ATROVENT) 0.03 % nasal spray Place 1 spray into both nostrils daily.   . isosorbide mononitrate (IMDUR) 120 MG 24 hr tablet Take 1 tablet (120 mg total) by mouth daily.  . isosorbide mononitrate (IMDUR) 60 MG 24 hr tablet TAKE 1 TABLET BY MOUTH ONCE DAILY IN  ADDITION  TO  ISOSORBIDE  120MG   . Lactase (LACTAID PO) Take 1 tablet by mouth 2 (two) times daily.  [name] losartan (COZAAR) 25 MG tablet Take 1 tablet (25 mg total) by mouth 2 (two) times daily.  [name] mexiletine (MEXITIL) 200 MG capsule TAKE 1 CAPSULE BY MOUTH EVERY 12 HOURS  . Multiple Vitamin (MULTIVITAMIN WITH MINERALS) TABS tablet Take 2 tablets by mouth daily.  . nitroGLYCERIN (NITROSTAT) 0.4 MG SL tablet Place 0.4 mg under the tongue every 5 (five)  minutes as needed for chest pain.  . pantoprazole (PROTONIX) 40 MG tablet TAKE 1 TABLET BY MOUTH ONCE DAILY  . polyethylene glycol powder (GLYCOLAX/MIRALAX) powder Take 17 g by mouth at bedtime. Mix in 8 oz liquid and drink  . RANEXA 500 MG 12 hr tablet TAKE 2 TABLETS BY MOUTH TWICE DAILY  . vitamin B-12 (CYANOCOBALAMIN) 1000 MCG tablet Take 2,000 mcg by mouth daily.     Allergies: Allergies  Allergen Reactions  . Amoxicillin     diaharrea  . Novocain [Procaine Hcl] Other (See Comments)    Cold sweats and very bad headache.   . Procaine Other (See Comments)    Reaction not recalled by the patient  . Metoprolol Hives, Itching  and Rash    Social History: The patient  reports that he has never smoked. He has never used smokeless tobacco. He reports that he does not drink alcohol or use drugs.   Family History: The patient's family history includes CAD in his brother; COPD in his brother and mother; Cancer in his mother; Deep vein thrombosis in his brother; Heart disease in his father.   Review of Systems: Please see the history of present illness.   Otherwise, the review of systems is positive for none.   All other systems are reviewed and negative.   Physical Exam: VS:  BP (!) 144/66 (BP Location: Left Arm, Patient Position: Sitting, Cuff Size: Normal)   Pulse 76   Ht 5\' 6"  (1.676 m)   Wt 139 lb (63 kg)   SpO2 100% Comment: at rest  BMI 22.44 kg/m  .  BMI Body mass index is 22.44 kg/m.  Wt Readings from Last 3 Encounters:  03/30/18 139 lb (63 kg)  11/30/17 135 lb 12.8 oz (61.6 kg)  11/01/17 135 lb 8 oz (61.5 kg)    General: Elderly. Alert and in no acute distress.   HEENT: Normal.  Neck: Supple, no JVD, carotid bruits, or masses noted.  Cardiac: Regular rate and rhythm. No murmurs, rubs, or gallops. No edema.  Respiratory:  Lungs are clear to auscultation bilaterally with normal work of breathing.  GI: Soft and nontender.  MS: No deformity or atrophy. Gait and ROM  intact.  Skin: Warm and dry. Color is normal.  Neuro:  Strength and sensation are intact and no gross focal deficits noted.  Psych: Alert, appropriate and with normal affect.   LABORATORY DATA:  EKG:  EKG is not ordered today.  Lab Results  Component Value Date   WBC 6.1 08/19/2016   HGB 11.7 (L) 08/19/2016   HCT 35.4 (L) 08/19/2016   PLT 140 (L) 08/19/2016   GLUCOSE 105 (H) 11/30/2017   CHOL 122 (L) 03/03/2016   TRIG 174 (H) 03/03/2016   HDL 39 (L) 03/03/2016   LDLCALC 48 03/03/2016   ALT 11 03/03/2016   AST 16 03/03/2016   NA 143 11/30/2017   K 4.2 11/30/2017   CL 107 (H) 11/30/2017   CREATININE 1.51 (H) 11/30/2017   BUN 30 (H) 11/30/2017   CO2 21 11/30/2017   TSH 2.053 10/24/2015   INR 1.20 07/27/2015       BNP (last 3 results) No results for input(s): BNP in the last 8760 hours.  ProBNP (last 3 results) No results for input(s): PROBNP in the last 8760 hours.   Other Studies Reviewed Today:  Echo Study Conclusions from 11/2015  - Left ventricle: The cavity size was normal. Systolic function was moderately to severely reduced. The estimated ejection fraction was in the range of 30% to 35%. There is akinesis of the apicalanterior, lateral, inferolateral, inferior, and apical myocardium. There is akinesis of the midanteroseptal myocardium. There was an increased relative contribution of atrial contraction to ventricular filling. Doppler parameters are consistent with abnormal left ventricular relaxation (grade 1 diastolic dysfunction). - Aortic valve: Trileaflet; normal thickness, mildly calcified leaflets. - Mitral valve: Calcified annulus. There was mild regurgitation.    Cath 03-08-2015:    Conclusion    Sequential SVG-OM2-OM3 was injected is large, and is anatomically normal.  Y Graft SVG-LAD from SVG-OM is large, and is anatomically normal. The downstream LAD is small caliber and diffusely diseased.  LM lesion, 100%  stenosed.  Ost LAD to Prox LAD  lesion, 100% stenosed.  Ost 2nd Mrg to 2nd Mrg lesion, 100% stenosed.  SVG was injected is small. JR4 Catheter  Prox Original SVG-OM1 100% stenosed.  Ost Cx to Mid Cx lesion, 100% stenosed.  Borderline LOW LVEDP   Impression:  Known severe native coronary disease. Disabled contrast the native arteries were not evaluated.  Occluded old SVG-OM1 - the dye remained standing in the proximal vessel for several minutes following angiography - not PCI amenable ? This is the likely culprit for the patient's episode of what sounds like anginal symptoms about a month ago. This may also be a cause of now worsening ventricular tachycardia.   Widely patent Y graft with one limb providing sequential grafting to OM1 and OM 2 as well as a separate limb serving as a graft to the LAD which retrograde fills the diagonal branch. This redo graft now provides the entirety of the patient's coronary circulation.  The graft obtuse marginal branches are relatively free of disease with retrograde filling back to the distal circumflex system which consists of the posterolateral branch and left PDA.  The grafted LAD itself was a very diffusely diseased vessel, but no change from previous.  Known small nondominant right coronary artery system.  Borderline low LVEDP   Recommendations:  No PCI targets.  Standard post radial cath care with TR band removal  Would proceed with ICD placement per EP    Assessment/Plan:  1.CAD - long standing - managed medically. No active symptoms - would favor conservative management.   2. HTN - BP looks ok on his current regimen. I have left him on his current regimen.   3. Chronic systolic HF - his medical therapy has been limited by bradycardia and significant conduction disease - he looks fairly well compensated - no changes made.   4. Paroxysmal ventricular tachycardia: He remains on a combination of mexiletine and Ranexa.    5. Hyperlipidemia: remains on statin therapy - followed by PCP  6. Chronic kidney disease stage III: stable.   7. Advanced age  Current medicines are reviewed with the patient today.  The patient does not have concerns regarding medicines other than what has been noted above.  The following changes have been made:  See above.  Labs/ tests ordered today include:   No orders of the defined types were placed in this encounter.    Disposition:   FU with Dr. Excell Seltzer in 4 months - I will see back in 8 months.   Patient is agreeable to this plan and will call if any problems develop in the interim.   SignedNorma Fredrickson, NP  03/30/2018 2:52 PM  [facility] [address] [place], [place]  [address] Phone: [phone] Fax: [phone]

## 2018-04-14 ENCOUNTER — Other Ambulatory Visit: Payer: Self-pay | Admitting: Cardiovascular Disease

## 2018-04-14 MED ORDER — RANOLAZINE ER 500 MG PO TB12: 1000.0000 mg | ORAL_TABLET | Freq: Two times a day (BID) | ORAL | 3 refills | Status: DC

## 2018-06-09 DIAGNOSIS — M109 Gout, unspecified: Secondary | ICD-10-CM | POA: Diagnosis not present

## 2018-06-09 DIAGNOSIS — I10 Essential (primary) hypertension: Secondary | ICD-10-CM | POA: Diagnosis not present

## 2018-06-09 DIAGNOSIS — Z125 Encounter for screening for malignant neoplasm of prostate: Secondary | ICD-10-CM | POA: Diagnosis not present

## 2018-06-09 DIAGNOSIS — E7849 Other hyperlipidemia: Secondary | ICD-10-CM | POA: Diagnosis not present

## 2018-06-09 DIAGNOSIS — R82998 Other abnormal findings in urine: Secondary | ICD-10-CM | POA: Diagnosis not present

## 2018-06-20 DIAGNOSIS — I498 Other specified cardiac arrhythmias: Secondary | ICD-10-CM | POA: Diagnosis not present

## 2018-06-20 DIAGNOSIS — K5909 Other constipation: Secondary | ICD-10-CM | POA: Diagnosis not present

## 2018-06-20 DIAGNOSIS — K5090 Crohn's disease, unspecified, without complications: Secondary | ICD-10-CM | POA: Diagnosis not present

## 2018-06-20 DIAGNOSIS — I7389 Other specified peripheral vascular diseases: Secondary | ICD-10-CM | POA: Diagnosis not present

## 2018-06-20 DIAGNOSIS — E538 Deficiency of other specified B group vitamins: Secondary | ICD-10-CM | POA: Diagnosis not present

## 2018-06-20 DIAGNOSIS — M109 Gout, unspecified: Secondary | ICD-10-CM | POA: Diagnosis not present

## 2018-06-20 DIAGNOSIS — I255 Ischemic cardiomyopathy: Secondary | ICD-10-CM | POA: Diagnosis not present

## 2018-06-20 DIAGNOSIS — Z0000 Encounter for general adult medical examination without abnormal findings: Secondary | ICD-10-CM | POA: Diagnosis not present

## 2018-06-20 DIAGNOSIS — N183 Chronic kidney disease, stage 3 (moderate): Secondary | ICD-10-CM | POA: Diagnosis not present

## 2018-07-27 ENCOUNTER — Ambulatory Visit: Payer: Medicare PPO | Admitting: Nurse Practitioner

## 2018-08-03 ENCOUNTER — Other Ambulatory Visit: Payer: Self-pay | Admitting: Cardiovascular Disease

## 2018-08-03 MED ORDER — PANTOPRAZOLE SODIUM 40 MG PO TBEC: 40.0000 mg | DELAYED_RELEASE_TABLET | Freq: Every day | ORAL | 2 refills | Status: DC

## 2018-08-03 MED ORDER — RANOLAZINE ER 500 MG PO TB12: 1000.0000 mg | ORAL_TABLET | Freq: Two times a day (BID) | ORAL | 2 refills | Status: DC

## 2018-08-03 MED ORDER — ISOSORBIDE MONONITRATE ER 120 MG PO TB24: 120.0000 mg | ORAL_TABLET | Freq: Every day | ORAL | 2 refills | Status: DC

## 2018-08-03 MED ORDER — ISOSORBIDE MONONITRATE ER 60 MG PO TB24: ORAL_TABLET | ORAL | 2 refills | Status: DC

## 2018-08-03 MED ORDER — CLOPIDOGREL BISULFATE 75 MG PO TABS: 75.0000 mg | ORAL_TABLET | Freq: Every day | ORAL | 2 refills | Status: DC

## 2018-08-03 MED ORDER — MEXILETINE HCL 200 MG PO CAPS: ORAL_CAPSULE | ORAL | 1 refills | Status: DC

## 2018-08-03 NOTE — Addendum Note (Signed)
Addended by: Demetrios Loll on: 08/03/2018 03:39 PM   Modules accepted: Orders

## 2018-08-04 ENCOUNTER — Other Ambulatory Visit: Payer: Self-pay | Admitting: *Deleted

## 2018-08-04 MED ORDER — LOSARTAN POTASSIUM 25 MG PO TABS: 25.0000 mg | ORAL_TABLET | Freq: Two times a day (BID) | ORAL | 1 refills | Status: DC

## 2018-09-20 ENCOUNTER — Telehealth: Payer: Self-pay | Admitting: *Deleted

## 2018-09-20 NOTE — Telephone Encounter (Signed)
Virtual Visit Pre-Appointment Phone Call  "(Name), I am calling you today to discuss your upcoming appointment. We are currently trying to limit exposure to the virus that causes COVID-19 by seeing patients at home rather than in the office."  1. "What is the BEST phone number to call the day of the visit?" - include this in appointment notes  2. "Do you have or have access to (through a family member/friend) a smartphone with video capability that we can use for your visit?" a. If yes - list this number in appt notes as "cell" (if different from BEST phone #) and list the appointment type as a VIDEO visit in appointment notes b. If no - list the appointment type as a PHONE visit in appointment notes  3. Confirm consent - "In the setting of the current Covid19 crisis, you are scheduled for a (phone or video) visit with your provider on (Wednesday, May 13) at (2:45 pm).  Just as we do with many in-office visits, in order for you to participate in this visit, we must obtain consent.  If you'd like, I can send this to your mychart (if signed up) or email for you to review.  Otherwise, I can obtain your verbal consent now.  All virtual visits are billed to your insurance company just like a normal visit would be.  By agreeing to a virtual visit, we'd like you to understand that the technology does not allow for your provider to perform an examination, and thus may limit your provider's ability to fully assess your condition. If your provider identifies any concerns that need to be evaluated in person, we will make arrangements to do so.  Finally, though the technology is pretty good, we cannot assure that it will always work on either your or our end, and in the setting of a video visit, we may have to convert it to a phone-only visit.  In either situation, we cannot ensure that we have a secure connection.  Are you willing to proceed?" STAFF: Did the patient verbally acknowledge consent to telehealth  visit? Document YES/NO here: YES  4. Advise patient to be prepared - "Two hours prior to your appointment, go ahead and check your blood pressure, pulse, oxygen saturation, and your weight (if you have the equipment to check those) and write them all down. When your visit starts, your provider will ask you for this information. If you have an Apple Watch or Kardia device, please plan to have heart rate information ready on the day of your appointment. Please have a pen and paper handy nearby the day of the visit as well."  5. Give patient instructions for MyChart download to smartphone OR Doximity/Doxy.me as below if video visit (depending on what platform provider is using)  6. Inform patient they will receive a phone call 15 minutes prior to their appointment time (may be from unknown caller ID) so they should be prepared to answer    TELEPHONE CALL NOTE  Karl Stevenson has been deemed a candidate for a follow-up tele-health visit to limit community exposure during the Covid-19 pandemic. I spoke with the patient via phone to ensure availability of phone/video source, confirm preferred email & phone number, and discuss instructions and expectations.  I reminded Karl Stevenson to be prepared with any vital sign and/or heart rhythm information that could potentially be obtained via home monitoring, at the time of his visit. I reminded Karl Stevenson to expect a phone  call prior to his visit.  Karl Stevenson 09/20/2018 9:48 AM   INSTRUCTIONS FOR DOWNLOADING THE MYCHART APP TO SMARTPHONE  - The patient must first make sure to have activated MyChart and know their login information - If Apple, go to App Store and type in MyChart in the search bar and download the app. If Android, ask patient to go to [employer] and type in [place] in the search bar and download the app. The app is free but as with any other app downloads, their phone may require them to verify saved payment  information or Apple/Android password.  - The patient will need to then log into the app with their MyChart username and password, and select [facility] as their healthcare provider to link the account. When it is time for your visit, go to the MyChart app, find appointments, and click Begin Video Visit. Be sure to Select Allow for your device to access the Microphone and Camera for your visit. You will then be connected, and your provider will be with you shortly.  **If they have any issues connecting, or need assistance please contact MyChart service desk [phone])**  **If using a computer, in order to ensure the best quality for their visit they will need to use either of the following Internet Browsers: [employer] or Google Chrome**  IF USING DOXIMITY or DOXY.ME - The patient will receive a link just prior to their visit by text.     FULL LENGTH CONSENT FOR TELE-HEALTH VISIT   I hereby voluntarily request, consent and authorize [facility] and its employed or contracted physicians, physician assistants, nurse practitioners or other licensed health care professionals (the Practitioner), to provide me with telemedicine health care services (the "Services") as deemed necessary by the treating Practitioner. I acknowledge and consent to receive the Services by the Practitioner via telemedicine. I understand that the telemedicine visit will involve communicating with the Practitioner through live audiovisual communication technology and the disclosure of certain medical information by electronic transmission. I acknowledge that I have been given the opportunity to request an in-person assessment or other available alternative prior to the telemedicine visit and am voluntarily participating in the telemedicine visit.  I understand that I have the right to withhold or withdraw my consent to the use of telemedicine in the course of my care at any time, without affecting my right  to future care or treatment, and that the Practitioner or I may terminate the telemedicine visit at any time. I understand that I have the right to inspect all information obtained and/or recorded in the course of the telemedicine visit and may receive copies of available information for a reasonable fee.  I understand that some of the potential risks of receiving the Services via telemedicine include:  [name] Delay or interruption in medical evaluation due to technological equipment failure or disruption; . Information transmitted may not be sufficient (e.g. poor resolution of images) to allow for appropriate medical decision making by the Practitioner; and/or  . In rare instances, security protocols could fail, causing a breach of personal health information.  Furthermore, I acknowledge that it is my responsibility to provide information about my medical history, conditions and care that is complete and accurate to the best of my ability. I acknowledge that Practitioner's advice, recommendations, and/or decision may be based on factors not within their control, such as incomplete or inaccurate data provided by me or distortions of diagnostic images or specimens that may result from  electronic transmissions. I understand that the practice of medicine is not an exact science and that Practitioner makes no warranties or guarantees regarding treatment outcomes. I acknowledge that I will receive a copy of this consent concurrently upon execution via email to the email address I last provided but may also request a printed copy by calling the office of [facility].    I understand that my insurance will be billed for this visit.   I have read or had this consent read to me. . I understand the contents of this consent, which adequately explains the benefits and risks of the Services being provided via telemedicine.  . I have been provided ample opportunity to ask questions regarding this consent and the Services  and have had my questions answered to my satisfaction. . I give my informed consent for the services to be provided through the use of telemedicine in my medical care  By participating in this telemedicine visit I agree to the above.

## 2018-09-20 NOTE — Progress Notes (Addendum)
Telehealth Visit     Virtual Visit via Telephone Note   This visit type was conducted due to national recommendations for restrictions regarding the COVID-19 Pandemic (e.g. social distancing) in an effort to limit this patient's exposure and mitigate transmission in our community.  Due to his co-morbid illnesses, this patient is at least at moderate risk for complications without adequate follow up.  This format is felt to be most appropriate for this patient at this time.  The patient did not have access to video technology/had technical difficulties with video requiring transitioning to audio format only (telephone).  All issues noted in this document were discussed and addressed.  No physical exam could be performed with this format.  Please refer to the patient's chart for his  consent to telehealth for [facility].   Evaluation Performed:  Follow-up visit  This visit type was conducted due to national recommendations for restrictions regarding the COVID-19 Pandemic (e.g. social distancing).  This format is felt to be most appropriate for this patient at this time.  All issues noted in this document were discussed and addressed.  No physical exam was performed (except for noted visual exam findings with Video Visits).  Please refer to the patient's chart (MyChart message for video visits and phone note for telephone visits) for the patient's consent to telehealth for [facility].  Date:  09/21/2018   ID:  Karl Stevenson, DOB January 24, 1927, MRN [id number]  Patient Location:  Home  Provider location:   Home  PCP:  Jarome Matin, MD  Cardiologist:  Tyrone Sage [name], MD  Electrophysiologist:  None   Chief Complaint:  Followup  History of Present Illness:    Karl Stevenson is a 85 y.o. male who presents via audio/video conferencing for a telehealth visit today.  Seen for Dr. Excell Seltzer.   He has ahistory of coronary artery disease with redo CABG in 2008. He has chronic  systolic heart failure with the severe ischemic cardiomyopathywithLVEF approximately 30 to 35%. Multiple comorbid conditions include peripheral arterial disease, hypertension, carotid stenosis with history of left carotid endarterectomy, abdominal aortic aneurysm, and ventricular tachycardia. He is followed by Dr. Graciela Husbands and is managed with mexiletine and Ranexa. Amiodarone was discontinued because of bradycardia.  Saw last saw Dr. Excell Seltzer in June of 2019 and was doing ok. He has stopped driving. Last seen by me back in November - he was felt to be stable overall.   The patient does not have symptoms concerning for COVID-19 infection (fever, chills, cough, or new shortness of breath).   Seen today via telephone conversation. He has consented for this visit. He does not have a smart phone, does not use MyChart or have a computer/device. Olive, his wife, is in the background. They are staying in with the quarantine. No chest pain. Says he "does not even think about that anymore". His legs seem to be his biggest issue - trouble getting around - using a walker/cane. No falls. BP looks ok. Not dizzy or lightheaded.  He will have some occasional fast heart rate - he goes and sits - it goes away. He otherwise feels he is ok. They are grocery shopping online. His weight is lower. He is eating good.   Past Medical History:  Diagnosis Date  . AAA (abdominal aortic aneurysm) (HCC)    a. Ectatic abdominal aorta with fusiform dilitation 3.4x3.4 and moderate thrombus burden 12/2010  . Bradycardia    a. previous intolerance to beta blockers - low dose toprol  started 10/2011 for tachycardia but had rash and was discontinued;  b. tolerating low dose propranolol.  . Carotid artery occlusion    a. s/p L CEA in 2010;  b. 06/2014 Carotid U/S: stable LICA CEA site w/o significant stenosis bilat.  . Chronic systolic heart failure (HCC)    a. 02/2015 Echo:  35-40% (echo 11/2010); c. 02/2015 Echo: EF 20-25%, diff HK,  mid-apicalanteroseptal and apical AK, Gr 1 DD, mild MR.  [name] COPD (chronic obstructive pulmonary disease) (HCC)   . Coronary artery disease    a. 1969 s/p MI;  b. S/P CABG 1987;  c. S/P redo 2008;  d. 07/2012 Cath: stable->Med Rx, EF 40%; e. 02/2014 MV: anterior and posterolateral/apical infarct with mild peri-infarct ischemia->Med Rx; f. 02/2015 Cath: LM 100d, LAD 100ost, LCX 100ost, OM2 100, VG->OM3->OM4 nl, VG-> LAD nl (Y from VG->OM), VG->OM2 100p.  . Crohn's disease (HCC)    a. s/p colon resection  . Gout   . Hyperlipidemia   . Hypertension   . Ischemic cardiomyopathy    a. EF 34% (Lexiscan 06/2011); b. 35-40% (echo 11/2010); c. 02/2015 Echo: EF 20-25%, diff HK, mid-apicalanteroseptal and apical AK, Gr 1 DD, mild MR.  . Peripheral vascular disease (HCC)    a. Carotid dz s/p L CEA, RAS, AAA, LE dz  . RBBB   . Renal artery stenosis (HCC)    a. Dopplers 12/2010 - 1-59% R ostial renal artery stenosis, 2 L renal arteries both widely patent  . Syncope   . Tachycardia    a. 10/2011 - atrial tach vs avnrt - BB started.  . Ventricular tachycardia (HCC)    a. 02/2015 ->amio 200 daily added-->later switched to mexilitene.   Past Surgical History:  Procedure Laterality Date  . ABDOMINAL AORTAGRAM N/A 06/11/2011   Procedure: ABDOMINAL [name];  Surgeon: [name], MD;  Location: [facility] CATH LAB;  Service: Cardiovascular;  Laterality: N/A;  . CARDIAC CATHETERIZATION  07/28/2012   Native 3v CAD, continued patency of the SVG-OM1-LAD and SVG-OM2-left PDA. LVEF 40% with multiple WMAs  . CARDIAC CATHETERIZATION N/A 03/08/2015   Procedure: Left Heart Cath and Coronary Angiography;  Surgeon: [name], MD;  Location: [facility] INVASIVE CV LAB;  Service: Cardiovascular;  Laterality: N/A;  . CAROTID ENDARTERECTOMY Left 2010  . CATARACT EXTRACTION, BILATERAL    . COLON RESECTION  2008  . COLON SURGERY    . CORONARY ANGIOPLASTY    . CORONARY ARTERY BYPASS GRAFT  10/31/85 & 08/19/06  . ENDARTERECTOMY  08/26/2011    Procedure: ENDARTERECTOMY ILIAC;  Surgeon: [name], MD;  Location: [facility] OR;  Service: Vascular;  Laterality: Right;  Right Femoral Artery Endarterectomy with vascu guard patch angioplasty & intraoperative arteriogram.  . EYE SURGERY    . FEMORAL ENDARTERECTOMY Left  06/2011   ileofemoral endarterectomy with bovine patch angioplasty  . LEFT HEART CATHETERIZATION WITH CORONARY ANGIOGRAM N/A 07/28/2012   Procedure: LEFT HEART CATHETERIZATION WITH CORONARY ANGIOGRAM;  Surgeon: [name], MD;  Location: [facility] CATH LAB;  Service: Cardiovascular;  Laterality: N/A;  . PR VEIN BYPASS GRAFT,AORTO-FEM-POP  10/1985  . TONSILLECTOMY     as a child     Current Meds  Medication Sig  . acetaminophen (TYLENOL) 500 MG tablet Take 1,000 mg by mouth every 6 (six) hours as needed (pain).   [name] aspirin EC 81 MG tablet Take 81 mg by mouth daily.  [name] atorvastatin (LIPITOR) 10 MG tablet Take 10 mg by mouth at bedtime.   . carboxymethylcellulose (  REFRESH TEARS) 0.5 % SOLN Place 1 drop into both eyes 4 (four) times daily.  . clopidogrel (PLAVIX) 75 MG tablet Take 1 tablet (75 mg total) by mouth daily.  [name]. ipratropium (ATROVENT) 0.03 % nasal spray Place 1 spray into both nostrils daily.   . isosorbide mononitrate (IMDUR) 120 MG 24 hr tablet Take 1 tablet (120 mg total) by mouth daily.  . isosorbide mononitrate (IMDUR) 60 MG 24 hr tablet TAKE 1 TABLET BY MOUTH ONCE DAILY IN  ADDITION  TO  ISOSORBIDE  120MG   . losartan (COZAAR) 25 MG tablet Take 1 tablet (25 mg total) by mouth 2 (two) times daily.  [name]. mexiletine (MEXITIL) 200 MG capsule TAKE 1 CAPSULE BY MOUTH EVERY 12 HOURS  . Multiple Vitamin (MULTIVITAMIN WITH MINERALS) TABS tablet Take 2 tablets by mouth daily.  . nitroGLYCERIN (NITROSTAT) 0.4 MG SL tablet Place 0.4 mg under the tongue every 5 (five) minutes as needed for chest pain.  . pantoprazole (PROTONIX) 40 MG tablet Take 1 tablet (40 mg total) by mouth daily.  . polyethylene glycol powder (GLYCOLAX/MIRALAX)  powder Take 17 g by mouth at bedtime. Mix in 8 oz liquid and drink  . ranolazine (RANEXA) 500 MG 12 hr tablet Take 2 tablets (1,000 mg total) by mouth 2 (two) times daily.  . vitamin B-12 (CYANOCOBALAMIN) 1000 MCG tablet Take 2,000 mcg by mouth daily.  . [DISCONTINUED] Lactase (LACTAID PO) Take 1 tablet by mouth 2 (two) times daily.     Allergies:   Amoxicillin; Novocain [procaine hcl]; Procaine; and Metoprolol   Social History   Tobacco Use  . Smoking status: Never Smoker  . Smokeless tobacco: Never Used  Substance Use Topics  . Alcohol use: No  . Drug use: No     Family Hx: The patient's family history includes CAD in his brother; COPD in his brother and mother; Cancer in his mother; Deep vein thrombosis in his brother; Heart disease in his father. There is no history of Anesthesia problems.  ROS:   Please see the history of present illness.   All other systems reviewed are negative.    Objective:    Vital Signs:  BP (!) 144/70   Pulse 79   Temp (!) 96.8 F (36 C)   Ht 5\' 7"  (1.702 m)   Wt 130 lb 8 oz (59.2 kg)   BMI 20.44 kg/m    Wt Readings from Last 3 Encounters:  09/21/18 130 lb 8 oz (59.2 kg)  03/30/18 139 lb (63 kg)  11/30/17 135 lb 12.8 oz (61.6 kg)    Alert male in no acute distress. Not short of breath with conversation. He is losing weight.     Labs/Other Tests and Data Reviewed:    Lab Results  Component Value Date   WBC 6.1 08/19/2016   HGB 11.7 (L) 08/19/2016   HCT 35.4 (L) 08/19/2016   PLT 140 (L) 08/19/2016   GLUCOSE 105 (H) 11/30/2017   CHOL 122 (L) 03/03/2016   TRIG 174 (H) 03/03/2016   HDL 39 (L) 03/03/2016   LDLCALC 48 03/03/2016   ALT 11 03/03/2016   AST 16 03/03/2016   NA 143 11/30/2017   K 4.2 11/30/2017   CL 107 (H) 11/30/2017   CREATININE 1.51 (H) 11/30/2017   BUN 30 (H) 11/30/2017   CO2 21 11/30/2017   TSH 2.053 10/24/2015   INR 1.20 07/27/2015        BNP (last 3 results) No results for input(s): BNP in the last  8760  hours.  ProBNP (last 3 results) No results for input(s): PROBNP in the last 8760 hours.    Prior CV studies:    The following studies were reviewed today:  Echo Study Conclusions from 11/2015  - Left ventricle: The cavity size was normal. Systolic function was moderately to severely reduced. The estimated ejection fraction was in the range of 30% to 35%. There is akinesis of the apicalanterior, lateral, inferolateral, inferior, and apical myocardium. There is akinesis of the midanteroseptal myocardium. There was an increased relative contribution of atrial contraction to ventricular filling. Doppler parameters are consistent with abnormal left ventricular relaxation (grade 1 diastolic dysfunction). - Aortic valve: Trileaflet; normal thickness, mildly calcified leaflets. - Mitral valve: Calcified annulus. There was mild regurgitation.   Cath 03-08-2015:    Conclusion    Sequential SVG-OM2-OM3 was injected is large, and is anatomically normal.  Y Graft SVG-LAD from SVG-OM is large, and is anatomically normal. The downstream LAD is small caliber and diffusely diseased.  LM lesion, 100% stenosed.  Ost LAD to Prox LAD lesion, 100% stenosed.  Ost 2nd Mrg to 2nd Mrg lesion, 100% stenosed.  SVG was injected is small. JR4 Catheter  Prox Original SVG-OM1 100% stenosed.  Ost Cx to Mid Cx lesion, 100% stenosed.  Borderline LOW LVEDP   Impression:  Known severe native coronary disease. Disabled contrast the native arteries were not evaluated.  Occluded old SVG-OM1 - the dye remained standing in the proximal vessel for several minutes following angiography - not PCI amenable ? This is the likely culprit for the patient's episode of what sounds like anginal symptoms about a month ago. This may also be a cause of now worsening ventricular tachycardia.   Widely patent Y graft with one limb providing sequential grafting to OM1 and OM 2 as well as a  separate limb serving as a graft to the LAD which retrograde fills the diagonal branch. This redo graft now provides the entirety of the patient's coronary circulation.  The graft obtuse marginal branches are relatively free of disease with retrograde filling back to the distal circumflex system which consists of the posterolateral branch and left PDA.  The grafted LAD itself was a very diffusely diseased vessel, but no change from previous.  Known small nondominant right coronary artery system.  Borderline low LVEDP   Recommendations:  No PCI targets.  Standard post radial cath care with TR band removal  Would proceed with ICD placement per EP     ASSESSMENT & PLAN:    1.CAD - long standing - managed medically.  He seems to be doing well. No changes made today.   2. HTN - BP today is ok.    3. Chronic systolic HF - his medical therapy has been limited by bradycardia and significant conduction disease - seems stable at this time.   4. Paroxysmal ventricular tachycardia:He remainson a combination of mexiletine and Ranexa.   5. Hyperlipidemia:remains on statin therapy - followed by PCP - lipids from January noted off KPN  6. Chronic kidney disease stage RUE:[id number].   7. Advanced age  45. COVID-19 Education: The signs and symptoms of COVID-19 were discussed with the patient and how to seek care for testing (follow up with PCP or arrange E-visit).  The importance of social distancing, staying at home, hand hygiene and wearing a mask when out in public were discussed today.  Patient Risk:   After full review of this patient's clinical status, I feel that they are at least  moderate risk at this time.  Time:   Today, I have spent 8 minutes with the patient with telehealth technology discussing the above issues.     Medication Adjustments/Labs and Tests Ordered: Current medicines are reviewed at length with the patient today.  Concerns regarding medicines are  outlined above.   Tests Ordered: No orders of the defined types were placed in this encounter.   Medication Changes: No orders of the defined types were placed in this encounter.   Disposition:  FU with me in 3 months.   Patient is agreeable to this plan and will call if any problems develop in the interim.   Avelina Laine, NP  09/21/2018 2:55 PM    [facility]  Addendum: 09/21/18   There was discrepancy with his weight - they have called back after this visit - his current weight is 136#.  Rosalio Macadamia, RN, ANP-C [facility] [address] [place], [place]  [address] [phone]

## 2018-09-21 ENCOUNTER — Encounter: Payer: Self-pay | Admitting: Nurse Practitioner

## 2018-09-21 ENCOUNTER — Telehealth: Payer: Self-pay | Admitting: Nurse Practitioner

## 2018-09-21 ENCOUNTER — Telehealth (INDEPENDENT_AMBULATORY_CARE_PROVIDER_SITE_OTHER): Payer: Medicare PPO | Admitting: Nurse Practitioner

## 2018-09-21 ENCOUNTER — Other Ambulatory Visit: Payer: Self-pay

## 2018-09-21 VITALS — BP 144/70 | HR 79 | Temp 96.8°F | Ht 67.0 in | Wt 130.5 lb

## 2018-09-21 DIAGNOSIS — Z7189 Other specified counseling: Secondary | ICD-10-CM

## 2018-09-21 DIAGNOSIS — I472 Ventricular tachycardia, unspecified: Secondary | ICD-10-CM

## 2018-09-21 DIAGNOSIS — I5022 Chronic systolic (congestive) heart failure: Secondary | ICD-10-CM

## 2018-09-21 DIAGNOSIS — I110 Hypertensive heart disease with heart failure: Secondary | ICD-10-CM

## 2018-09-21 DIAGNOSIS — I2510 Atherosclerotic heart disease of native coronary artery without angina pectoris: Secondary | ICD-10-CM | POA: Diagnosis not present

## 2018-09-21 DIAGNOSIS — E7800 Pure hypercholesterolemia, unspecified: Secondary | ICD-10-CM

## 2018-09-21 DIAGNOSIS — I10 Essential (primary) hypertension: Secondary | ICD-10-CM

## 2018-09-21 DIAGNOSIS — I255 Ischemic cardiomyopathy: Secondary | ICD-10-CM

## 2018-09-21 NOTE — Telephone Encounter (Signed)
New message   Patient states that he gave his wrong weight during the televisit today it should be 136 lbs.

## 2018-09-21 NOTE — Patient Instructions (Addendum)
After Visit Summary:  We will be checking the following labs today - NONE   Medication Instructions:    Continue with your current medicines.    If you need a refill on your cardiac medications before your next appointment, please call your pharmacy.     Testing/Procedures To Be Arranged:  N/A  Follow-Up:   See me in about 3 months    At [facility], you and your health needs are our priority.  As part of our continuing mission to provide you with exceptional heart care, we have created designated Provider Care Teams.  These Care Teams include your primary Cardiologist (physician) and Advanced Practice Providers (APPs -  Physician Assistants and Nurse Practitioners) who all work together to provide you with the care you need, when you need it.  Special Instructions:  . Stay safe, stay home, wash your hands for at least 20 seconds and wear a mask when out in public.  . It was good to talk with you both today.    Call the [facility] office at [phone] if you have any questions, problems or concerns.

## 2018-09-21 NOTE — Telephone Encounter (Signed)
Norma Fredrickson, NP has been advised and will update pt's note.

## 2018-12-05 ENCOUNTER — Other Ambulatory Visit: Payer: Self-pay | Admitting: Cardiovascular Disease

## 2018-12-31 ENCOUNTER — Other Ambulatory Visit: Payer: Self-pay | Admitting: Internal Medicine

## 2019-01-20 NOTE — Progress Notes (Signed)
CARDIOLOGY OFFICE NOTE  Date:  01/25/2019    Karl Stevenson Date of Birth: 12-Oct-1926 Medical Record #[id number]  PCP:  [name], MD  Cardiologist:  Jerel Shepherd   Chief Complaint  Patient presents with  . Follow-up    History of Present Illness: Karl Stevenson is a 85 y.o. male who presents today for a follow up visit. Seen for Dr. Burt Knack.   He has ahistory of coronary artery disease with redo CABG in 2008. He has chronic systolic heart failure with the severe ischemic cardiomyopathywithLVEF approximately 30 to 35%. Multiple comorbid conditions include peripheral arterial disease, hypertension, carotid stenosis with history of left carotid endarterectomy, abdominal aortic aneurysm, and ventricular tachycardia. He is followed by Dr. Caryl Comes and is managed with mexiletine and Ranexa. Amiodarone was discontinued because of bradycardia.  Sawlast sawDr. Burt Knack in June of 2019 andwas doing ok. He has stopped driving. Last seen by me here in the office back in November - he was felt to be stable overall. We did a telehealth visit back in May - seemed to be doing ok - having more issues with ambulation.   The patient does not have symptoms concerning for COVID-19 infection (fever, chills, cough, or new shortness of breath).   Comes in today. Here alone. His wife has had a fall - no fracture but still with lots of pain. Their son is helping out. Mr. Lingelbach is having to do more as well. He is feeling good. No chest pain. Breathing is good. He has not had a fall. No chest pain noted. No real palpitation. He brought me his BP and HR readings on his "ticker tape" - looks pretty good for the most part. He feels like he is doing well. Spending time watching his birds.   Past Medical History:  Diagnosis Date  . AAA (abdominal aortic aneurysm) ([place])    a. Ectatic abdominal aorta with fusiform dilitation 3.4x3.4 and moderate thrombus burden 12/2010  . Bradycardia    a.  previous intolerance to beta blockers - low dose toprol started 10/2011 for tachycardia but had rash and was discontinued;  b. tolerating low dose propranolol.  . Carotid artery occlusion    a. s/p L CEA in 2010;  b. 06/2014 Carotid U/S: stable LICA CEA site w/o significant stenosis bilat.  . Chronic systolic heart failure ([place])    a. 02/2015 Echo:  35-40% (echo 11/2010); c. 02/2015 Echo: EF 20-25%, diff HK, mid-apicalanteroseptal and apical AK, Gr 1 DD, mild MR.  [name] COPD (chronic obstructive pulmonary disease) ([place])   . Coronary artery disease    a. 1969 s/p MI;  b. S/P CABG 1987;  c. S/P redo 2008;  d. 07/2012 Cath: stable->Med Rx, EF 40%; e. 02/2014 MV: anterior and posterolateral/apical infarct with mild peri-infarct ischemia->Med Rx; f. 02/2015 Cath: LM 100d, LAD 100ost, LCX 100ost, OM2 100, VG->OM3->OM4 nl, VG-> LAD nl (Y from VG->OM), VG->OM2 100p.  . Crohn's disease ([place])    a. s/p colon resection  . Gout   . Hyperlipidemia   . Hypertension   . Ischemic cardiomyopathy    a. EF 34% (Lexiscan 06/2011); b. 35-40% (echo 11/2010); c. 02/2015 Echo: EF 20-25%, diff HK, mid-apicalanteroseptal and apical AK, Gr 1 DD, mild MR.  . Peripheral vascular disease ([place])    a. Carotid dz s/p L CEA, RAS, AAA, LE dz  . RBBB   . Renal artery stenosis ([place])    a. Dopplers 12/2010 - 1-59% R ostial renal  artery stenosis, 2 L renal arteries both widely patent  . Syncope   . Tachycardia    a. 10/2011 - atrial tach vs avnrt - BB started.  . Ventricular tachycardia ([facility])    a. 02/2015 ->amio 200 daily added-->later switched to mexilitene.    Past Surgical History:  Procedure Laterality Date  . ABDOMINAL AORTAGRAM N/A 06/11/2011   Procedure: ABDOMINAL [name];  Surgeon: [name], MD;  Location: [facility] CATH LAB;  Service: Cardiovascular;  Laterality: N/A;  . CARDIAC CATHETERIZATION  07/28/2012   Native 3v CAD, continued patency of the SVG-OM1-LAD and SVG-OM2-left PDA. LVEF 40% with multiple WMAs  . CARDIAC  CATHETERIZATION N/A 03/08/2015   Procedure: Left Heart Cath and Coronary Angiography;  Surgeon: [name], MD;  Location: [facility] INVASIVE CV LAB;  Service: Cardiovascular;  Laterality: N/A;  . CAROTID ENDARTERECTOMY Left 2010  . CATARACT EXTRACTION, BILATERAL    . COLON RESECTION  2008  . COLON SURGERY    . CORONARY ANGIOPLASTY    . CORONARY ARTERY BYPASS GRAFT  10/31/85 & 08/19/06  . ENDARTERECTOMY  08/26/2011   Procedure: ENDARTERECTOMY ILIAC;  Surgeon: [name], MD;  Location: [facility] OR;  Service: Vascular;  Laterality: Right;  Right Femoral Artery Endarterectomy with vascu guard patch angioplasty & intraoperative arteriogram.  . EYE SURGERY    . FEMORAL ENDARTERECTOMY Left  06/2011   ileofemoral endarterectomy with bovine patch angioplasty  . LEFT HEART CATHETERIZATION WITH CORONARY ANGIOGRAM N/A 07/28/2012   Procedure: LEFT HEART CATHETERIZATION WITH CORONARY ANGIOGRAM;  Surgeon: [name], MD;  Location: [facility] CATH LAB;  Service: Cardiovascular;  Laterality: N/A;  . PR VEIN BYPASS GRAFT,AORTO-FEM-POP  10/1985  . TONSILLECTOMY     as a child     Medications: Current Meds  Medication Sig  . acetaminophen (TYLENOL) 500 MG tablet Take 1,000 mg by mouth every 6 (six) hours as needed (pain).   [name] aspirin EC 81 MG tablet Take 81 mg by mouth daily.  [name] atorvastatin (LIPITOR) 10 MG tablet Take 10 mg by mouth at bedtime.   . carboxymethylcellulose (REFRESH TEARS) 0.5 % SOLN Place 1 drop into both eyes 4 (four) times daily.  . clopidogrel (PLAVIX) 75 MG tablet Take 1 tablet (75 mg total) by mouth daily.  [name] ipratropium (ATROVENT) 0.03 % nasal spray Place 1 spray into both nostrils daily.   . isosorbide mononitrate (IMDUR) 120 MG 24 hr tablet Take 1 tablet (120 mg total) by mouth daily.  . isosorbide mononitrate (IMDUR) 60 MG 24 hr tablet TAKE 1 TABLET BY MOUTH ONCE DAILY IN  ADDITION  TO  ISOSORBIDE    . losartan (COZAAR) 25 MG tablet TAKE 1 TABLET TWO  TIMES DAILY.  [name] mexiletine  (MEXITIL) 200 MG capsule TAKE 1 CAPSULE BY MOUTH EVERY 12 HOURS  . Multiple Vitamin (MULTIVITAMIN WITH MINERALS) TABS tablet Take 2 tablets by mouth daily.  . nitroGLYCERIN (NITROSTAT) 0.4 MG SL tablet Place 0.4 mg under the tongue every 5 (five) minutes as needed for chest pain.  . pantoprazole (PROTONIX) 40 MG tablet Take 1 tablet (40 mg total) by mouth daily.  . polyethylene glycol powder (GLYCOLAX/MIRALAX) powder Take 17 g by mouth at bedtime. Mix in 8 oz liquid and drink  . ranolazine (RANEXA) 500 MG 12 hr tablet Take 2 tablets (1,000 mg total) by mouth 2 (two) times daily.  . vitamin B-12 (CYANOCOBALAMIN) 1000 MCG tablet Take 2,000 mcg by mouth daily.     Allergies: Allergies  Allergen Reactions  .  Amoxicillin     diaharrea  . Novocain [Procaine Hcl] Other (See Comments)    Cold sweats and very bad headache.   . Procaine Other (See Comments)    Reaction not recalled by the patient  . Metoprolol Hives, Itching and Rash    Social History: The patient  reports that he has never smoked. He has never used smokeless tobacco. He reports that he does not drink alcohol or use drugs.   Family History: The patient's family history includes CAD in his brother; COPD in his brother and mother; Cancer in his mother; Deep vein thrombosis in his brother; Heart disease in his father.   Review of Systems: Please see the history of present illness.   All other systems are reviewed and negative.   Physical Exam: VS:  BP (!) 160/80 (BP Location: Left Arm, Patient Position: Sitting, Cuff Size: Normal)   Pulse 69   Ht 5\' 7"  (1.702 m)   Wt 135 lb 12.8 oz (61.6 kg)   BMI 21.27 kg/m  .  BMI Body mass index is 21.27 kg/m.  Wt Readings from Last 3 Encounters:  01/25/19 135 lb 12.8 oz (61.6 kg)  09/21/18 130 lb 8 oz (59.2 kg)  03/30/18 139 lb (63 kg)    General: Pleasant. Elderly. Alert and in no acute distress.  Weight is down a few pounds.  HEENT: Normal.  Neck: Supple, no JVD, carotid  bruits, or masses noted.  Cardiac: Regular rate. Heart tones are distant. No edema.  Respiratory:  Lungs are clear to auscultation bilaterally with normal work of breathing.  GI: Soft and nontender.  MS: No deformity or atrophy. Gait and ROM intact.  Skin: Warm and dry. Color is normal.  Neuro:  Strength and sensation are intact and no gross focal deficits noted.  Psych: Alert, appropriate and with normal affect.   LABORATORY DATA:  EKG:  EKG is ordered today. This demonstrates NSR with 1st degree AV block - bifascicular block.  Lab Results  Component Value Date   WBC 6.1 08/19/2016   HGB 11.7 (L) 08/19/2016   HCT 35.4 (L) 08/19/2016   PLT 140 (L) 08/19/2016   GLUCOSE 105 (H) 11/30/2017   CHOL 122 (L) 03/03/2016   TRIG 174 (H) 03/03/2016   HDL 39 (L) 03/03/2016   LDLCALC 48 03/03/2016   ALT 11 03/03/2016   AST 16 03/03/2016   NA 143 11/30/2017   K 4.2 11/30/2017   CL 107 (H) 11/30/2017   CREATININE 1.51 (H) 11/30/2017   BUN 30 (H) 11/30/2017   CO2 21 11/30/2017   TSH 2.053 10/24/2015   INR 1.20 07/27/2015       BNP (last 3 results) No results for input(s): BNP in the last 8760 hours.  ProBNP (last 3 results) No results for input(s): PROBNP in the last 8760 hours.   Other Studies Reviewed Today:   Echo Study Conclusions from 11/2015  - Left ventricle: The cavity size was normal. Systolic function was moderately to severely reduced. The estimated ejection fraction was in the range of 30% to 35%. There is akinesis of the apicalanterior, lateral, inferolateral, inferior, and apical myocardium. There is akinesis of the midanteroseptal myocardium. There was an increased relative contribution of atrial contraction to ventricular filling. Doppler parameters are consistent with abnormal left ventricular relaxation (grade 1 diastolic dysfunction). - Aortic valve: Trileaflet; normal thickness, mildly calcified leaflets. - Mitral valve: Calcified  annulus. There was mild regurgitation.   Cath 03-08-2015:    Conclusion  Sequential SVG-OM2-OM3 was injected is large, and is anatomically normal.  Y Graft SVG-LAD from SVG-OM is large, and is anatomically normal. The downstream LAD is small caliber and diffusely diseased.  LM lesion, 100% stenosed.  Ost LAD to Prox LAD lesion, 100% stenosed.  Ost 2nd Mrg to 2nd Mrg lesion, 100% stenosed.  SVG was injected is small. JR4 Catheter  Prox Original SVG-OM1 100% stenosed.  Ost Cx to Mid Cx lesion, 100% stenosed.  Borderline LOW LVEDP   Impression:  Known severe native coronary disease. Disabled contrast the native arteries were not evaluated.  Occluded old SVG-OM1 - the dye remained standing in the proximal vessel for several minutes following angiography - not PCI amenable ? This is the likely culprit for the patient's episode of what sounds like anginal symptoms about a month ago. This may also be a cause of now worsening ventricular tachycardia.   Widely patent Y graft with one limb providing sequential grafting to OM1 and OM 2 as well as a separate limb serving as a graft to the LAD which retrograde fills the diagonal branch. This redo graft now provides the entirety of the patient's coronary circulation.  The graft obtuse marginal branches are relatively free of disease with retrograde filling back to the distal circumflex system which consists of the posterolateral branch and left PDA.  The grafted LAD itself was a very diffusely diseased vessel, but no change from previous.  Known small nondominant right coronary artery system.  Borderline low LVEDP   Recommendations:  No PCI targets.  Standard post radial cath care with TR band removal  Would proceed with ICD placement per EP     ASSESSMENT & PLAN:    1.CAD - extensive and long standing - he is managed medically - doing well - no changes made today. Lab today. He seems to be slowing down  in a general fashion but ok from our standpoint.    2.HTN -BP up some here today - he has fairly good control at home - tends to have some lability but overall it looks fine - I have left him on his current regimen.   3. Chronic systolic HF - with reduced EF - his medical therapy has been limited by bradycardia and significant conduction disease - his symptoms are stable - no changes made today.   4.Paroxysmal ventricular tachycardia:He remainson a combination of mexiletine and Ranexa. No reports of palpitations reported.   5. Hyperlipidemia:remains on statin - no recent labs - will get today.   6. Chronic kidney disease stage ZOX:[id number]:rechecking labs today.   7. Advanced age   248. COVID-19 Education: The signs and symptoms of COVID-19 were discussed with the patient and how to seek care for testing (follow up with PCP or arrange E-visit).  The importance of social distancing, staying at home, hand hygiene and wearing a mask when out in public were discussed today.  Current medicines are reviewed with the patient today.  The patient does not have concerns regarding medicines other than what has been noted above.  The following changes have been made:  See above.  Labs/ tests ordered today include:    Orders Placed This Encounter  Procedures  . Basic metabolic panel  . CBC  . Hepatic function panel  . Lipid panel  . EKG 12-Lead     Disposition:   FU with me in about 4 months.    Patient is agreeable to this plan and will call if any problems develop in the  interim.   SignedNorma Fredrickson: Anjalina Bergevin, NP  01/25/2019 2:27 PM  [facility] [address] [place], [place]  [address] Phone: [phone]) [phone] Fax: [phone]

## 2019-01-25 ENCOUNTER — Ambulatory Visit (INDEPENDENT_AMBULATORY_CARE_PROVIDER_SITE_OTHER): Payer: Medicare PPO | Admitting: Nurse Practitioner

## 2019-01-25 ENCOUNTER — Other Ambulatory Visit: Payer: Self-pay

## 2019-01-25 ENCOUNTER — Encounter: Payer: Self-pay | Admitting: Nurse Practitioner

## 2019-01-25 VITALS — BP 160/80 | HR 69 | Ht 67.0 in | Wt 135.8 lb

## 2019-01-25 DIAGNOSIS — I472 Ventricular tachycardia, unspecified: Secondary | ICD-10-CM

## 2019-01-25 DIAGNOSIS — E7800 Pure hypercholesterolemia, unspecified: Secondary | ICD-10-CM

## 2019-01-25 DIAGNOSIS — Z7189 Other specified counseling: Secondary | ICD-10-CM

## 2019-01-25 DIAGNOSIS — I2510 Atherosclerotic heart disease of native coronary artery without angina pectoris: Secondary | ICD-10-CM | POA: Diagnosis not present

## 2019-01-25 DIAGNOSIS — I10 Essential (primary) hypertension: Secondary | ICD-10-CM

## 2019-01-25 DIAGNOSIS — I5022 Chronic systolic (congestive) heart failure: Secondary | ICD-10-CM

## 2019-01-25 DIAGNOSIS — I255 Ischemic cardiomyopathy: Secondary | ICD-10-CM | POA: Diagnosis not present

## 2019-01-25 NOTE — Patient Instructions (Addendum)
After Visit Summary:  We will be checking the following labs today - BMET, CBC, HPF and Lipids   Medication Instructions:    Continue with your current medicines.    If you need a refill on your cardiac medications before your next appointment, please call your pharmacy.     Testing/Procedures To Be Arranged:  N/A  Follow-Up:   See me in about 4 months    At [facility], you and your health needs are our priority.  As part of our continuing mission to provide you with exceptional heart care, we have created designated Provider Care Teams.  These Care Teams include your primary Cardiologist (physician) and Advanced Practice Providers (APPs -  Physician Assistants and Nurse Practitioners) who all work together to provide you with the care you need, when you need it.  Special Instructions:  . Stay safe, stay home, wash your hands for at least 20 seconds and wear a mask when out in public.  . It was good to talk with you today.    Call the [facility] office at [phone] if you have any questions, problems or concerns.       

## 2019-01-26 LAB — LIPID PANEL
Chol/HDL Ratio: 3.1 ratio (ref 0.0–5.0)
Cholesterol, Total: 116 mg/dL (ref 100–199)
HDL: 37 mg/dL — ABNORMAL LOW (ref >39)
LDL Chol Calc (NIH): 39 mg/dL (ref 0–99)
Triglycerides: 259 mg/dL — ABNORMAL HIGH (ref 0–149)
VLDL Cholesterol Cal: 40 mg/dL (ref 5–40)

## 2019-01-26 LAB — CBC
Hematocrit: 33 % — ABNORMAL LOW (ref 37.5–51.0)
Hemoglobin: 10.8 g/dL — ABNORMAL LOW (ref 13.0–17.7)
MCH: 34.8 pg — ABNORMAL HIGH (ref 26.6–33.0)
MCHC: 32.7 g/dL (ref 31.5–35.7)
MCV: 107 fL — ABNORMAL HIGH (ref 79–97)
Platelets: 157 10*3/uL (ref 150–450)
RBC: 3.1 x10E6/uL — ABNORMAL LOW (ref 4.14–5.80)
RDW: 11.9 % (ref 11.6–15.4)
WBC: 5.1 10*3/uL (ref 3.4–10.8)

## 2019-01-26 LAB — BASIC METABOLIC PANEL
BUN/Creatinine Ratio: 21 (ref 10–24)
BUN: 27 mg/dL (ref 8–27)
CO2: 21 mmol/L (ref 20–29)
Calcium: 8.6 mg/dL (ref 8.6–10.2)
Chloride: 106 mmol/L (ref 96–106)
Creatinine, Ser: 1.3 mg/dL — ABNORMAL HIGH (ref 0.76–1.27)
GFR calc Af Amer: 56 mL/min/{1.73_m2} — ABNORMAL LOW (ref >59)
GFR calc non Af Amer: 48 mL/min/{1.73_m2} — ABNORMAL LOW (ref >59)
Glucose: 94 mg/dL (ref 65–99)
Potassium: 4.4 mmol/L (ref 3.5–5.2)
Sodium: 141 mmol/L (ref 134–144)

## 2019-01-26 LAB — HEPATIC FUNCTION PANEL
ALT: 13 IU/L (ref 0–44)
AST: 20 IU/L (ref 0–40)
Albumin: 4.3 g/dL (ref 3.6–4.6)
Alkaline Phosphatase: 68 IU/L (ref 39–117)
Bilirubin Total: 0.3 mg/dL (ref 0.0–1.2)
Bilirubin, Direct: 0.14 mg/dL (ref 0.00–0.40)
Total Protein: 6.2 g/dL (ref 6.0–8.5)

## 2019-02-21 ENCOUNTER — Other Ambulatory Visit: Payer: Self-pay | Admitting: Cardiovascular Disease

## 2019-03-25 ENCOUNTER — Other Ambulatory Visit: Payer: Self-pay | Admitting: Cardiovascular Disease

## 2019-05-19 NOTE — Progress Notes (Deleted)
CARDIOLOGY OFFICE NOTE  Date:  05/19/2019    Karl Stevenson Date of Birth: [birth year] Medical Record #[id number]  PCP:  Karl Battles, MD  Cardiologist:  Servando Snare & ***    No chief complaint on file.   History of Present Illness: Karl Stevenson is a 86 y.o. male who presents today for a *** Seen for Dr. Burt Knack.   He has ahistory of coronary artery disease with redo CABG in 2008. He has chronic systolic heart failure with the severe ischemic cardiomyopathywithLVEF approximately 30 to 35%. Multiple comorbid conditions include peripheral arterial disease, hypertension, carotid stenosis with history of left carotid endarterectomy, abdominal aortic aneurysm, and ventricular tachycardia. He is followed by Dr. Caryl Comes and is managed with mexiletine and Ranexa. Amiodarone was discontinued because of bradycardia.  Sawlast sawDr. Burt Knack in [place] 2019andwas doing ok.He has stopped driving. Last seen by me here in the office back in November - he was felt to be stable overall.We did a telehealth visit back in May - seemed to be doing ok - having more issues with ambulation.   The patient does not have symptoms concerning for COVID-19 infection (fever, chills, cough, or new shortness of breath).   Comes in today. Here alone. His wife has had a fall - no fracture but still with lots of pain. Their son is helping out. Karl Stevenson is having to do more as well. He is feeling good. No chest pain. Breathing is good. He has not had a fall. No chest pain noted. No real palpitation. He brought me his BP and HR readings on his "ticker tape" - looks pretty good for the most part. He feels like he is doing well. Spending time watching his birds.  The patient {does/does not:200015} have symptoms concerning for COVID-19 infection (fever, chills, cough, or new shortness of breath).   Comes in today. Here with   Past Medical History:  Diagnosis Date  . AAA (abdominal aortic aneurysm)  ([place])    a. Ectatic abdominal aorta with fusiform dilitation 3.4x3.4 and moderate thrombus burden 12/2010  . Bradycardia    a. previous intolerance to beta blockers - low dose toprol started 10/2011 for tachycardia but had rash and was discontinued;  b. tolerating low dose propranolol.  . Carotid artery occlusion    a. s/p L CEA in 2010;  b. 06/2014 Carotid U/S: stable LICA CEA site w/o significant stenosis bilat.  . Chronic systolic heart failure ([employer])    a. 02/2015 Echo:  35-40% (echo 11/2010); c. 02/2015 Echo: EF 20-25%, diff HK, mid-apicalanteroseptal and apical AK, Gr 1 DD, mild MR.  [name] COPD (chronic obstructive pulmonary disease) ([place])   . Coronary artery disease    a. 1969 s/p MI;  b. S/P CABG 1987;  c. S/P redo 2008;  d. 07/2012 Cath: stable->Med Rx, EF 40%; e. 02/2014 MV: anterior and posterolateral/apical infarct with mild peri-infarct ischemia->Med Rx; f. 02/2015 Cath: LM 100d, LAD 100ost, LCX 100ost, OM2 100, VG->OM3->OM4 nl, VG-> LAD nl (Y from VG->OM), VG->OM2 100p.  . Crohn's disease ([place])    a. s/p colon resection  . Gout   . Hyperlipidemia   . Hypertension   . Ischemic cardiomyopathy    a. EF 34% (Lexiscan 06/2011); b. 35-40% (echo 11/2010); c. 02/2015 Echo: EF 20-25%, diff HK, mid-apicalanteroseptal and apical AK, Gr 1 DD, mild MR.  . Peripheral vascular disease ([place])    a. Carotid dz s/p L CEA, RAS, AAA, LE dz  . RBBB   .  Renal artery stenosis ([facility])    a. Dopplers 12/2010 - 1-59% R ostial renal artery stenosis, 2 L renal arteries both widely patent  . Syncope   . Tachycardia    a. 10/2011 - atrial tach vs avnrt - BB started.  . Ventricular tachycardia ([facility])    a. 02/2015 ->amio 200 daily added-->later switched to mexilitene.    Past Surgical History:  Procedure Laterality Date  . ABDOMINAL AORTAGRAM N/A 06/11/2011   Procedure: ABDOMINAL [name];  Surgeon: [name], MD;  Location[facility] CATH LAB;  Service: Cardiovascular;  Laterality: N/A;  . CARDIAC CATHETERIZATION   07/28/2012   Native 3v CAD, continued patency of the SVG-OM1-LAD and SVG-OM2-left PDA. LVEF 40% with multiple WMAs  . CARDIAC CATHETERIZATION N/A 03/08/2015   Procedure: Left Heart Cath and Coronary Angiography;  Surgeon: [name], MD;  Location: [facility] INVASIVE CV LAB;  Service: Cardiovascular;  Laterality: N/A;  . CAROTID ENDARTERECTOMY Left 2010  . CATARACT EXTRACTION, BILATERAL    . COLON RESECTION  2008  . COLON SURGERY    . CORONARY ANGIOPLASTY    . CORONARY ARTERY BYPASS GRAFT  10/31/85 & 08/19/06  . ENDARTERECTOMY  08/26/2011   Procedure: ENDARTERECTOMY ILIAC;  Surgeon: [name], MD;  Location: [facility] OR;  Service: Vascular;  Laterality: Right;  Right Femoral Artery Endarterectomy with vascu guard patch angioplasty & intraoperative arteriogram.  . EYE SURGERY    . FEMORAL ENDARTERECTOMY Left  06/2011   ileofemoral endarterectomy with bovine patch angioplasty  . LEFT HEART CATHETERIZATION WITH CORONARY ANGIOGRAM N/A 07/28/2012   Procedure: LEFT HEART CATHETERIZATION WITH CORONARY ANGIOGRAM;  Surgeon: [name], MD;  Location: [facility] CATH LAB;  Service: Cardiovascular;  Laterality: N/A;  . PR VEIN BYPASS GRAFT,AORTO-FEM-POP  10/1985  . TONSILLECTOMY     as a child     Medications: No outpatient medications have been marked as taking for the 05/24/19 encounter (Appointment) with Rosalio Macadamia, NP.     Allergies: Allergies  Allergen Reactions  . Amoxicillin     diaharrea  . Novocain [Procaine Hcl] Other (See Comments)    Cold sweats and very bad headache.   . Procaine Other (See Comments)    Reaction not recalled by the patient  . Metoprolol Hives, Itching and Rash    Social History: The patient  reports that he has never smoked. He has never used smokeless tobacco. He reports that he does not drink alcohol or use drugs.   Family History: The patient's ***family history includes CAD in his brother; COPD in his brother and mother; Cancer in his mother; Deep vein  thrombosis in his brother; Heart disease in his father.   Review of Systems: Please see the history of present illness.   All other systems are reviewed and negative.   Physical Exam: VS:  There were no vitals taken for this visit. [name]  BMI There is no height or weight on file to calculate BMI.  Wt Readings from Last 3 Encounters:  01/25/19 135 lb 12.8 oz (61.6 kg)  09/21/18 130 lb 8 oz (59.2 kg)  03/30/18 139 lb (63 kg)    General: Pleasant. Well developed, well nourished and in no acute distress.   HEENT: Normal.  Neck: Supple, no JVD, carotid bruits, or masses noted.  Cardiac: ***Regular rate and rhythm. No murmurs, rubs, or gallops. No edema.  Respiratory:  Lungs are clear to auscultation bilaterally with normal work of breathing.  GI: Soft and nontender.  MS: No deformity or  atrophy. Gait and ROM intact.  Skin: Warm and dry. Color is normal.  Neuro:  Strength and sensation are intact and no gross focal deficits noted.  Psych: Alert, appropriate and with normal affect.   LABORATORY DATA:  EKG:  EKG {ACTION; IS/IS WCH:[id number]} ordered today. This demonstrates ***.  Lab Results  Component Value Date   WBC 5.1 01/25/2019   HGB 10.8 (L) 01/25/2019   HCT 33.0 (L) 01/25/2019   PLT 157 01/25/2019   GLUCOSE 94 01/25/2019   CHOL 116 01/25/2019   TRIG 259 (H) 01/25/2019   HDL 37 (L) 01/25/2019   LDLCALC 39 01/25/2019   ALT 13 01/25/2019   AST 20 01/25/2019   NA 141 01/25/2019   K 4.4 01/25/2019   CL 106 01/25/2019   CREATININE 1.30 (H) 01/25/2019   BUN 27 01/25/2019   CO2 21 01/25/2019   TSH 2.053 10/24/2015   INR 1.20 07/27/2015     BNP (last 3 results) No results for input(s): BNP in the last 8760 hours.  ProBNP (last 3 results) No results for input(s): PROBNP in the last 8760 hours.   Other Studies Reviewed Today:  Echo Study Conclusions from 11/2015  - Left ventricle: The cavity size was normal. Systolic function was moderately to severely reduced. The  estimated ejection fraction was in the range of 30% to 35%. There is akinesis of the apicalanterior, lateral, inferolateral, inferior, and apical myocardium. There is akinesis of the midanteroseptal myocardium. There was an increased relative contribution of atrial contraction to ventricular filling. Doppler parameters are consistent with abnormal left ventricular relaxation (grade 1 diastolic dysfunction). - Aortic valve: Trileaflet; normal thickness, mildly calcified leaflets. - Mitral valve: Calcified annulus. There was mild regurgitation.   Cath 03-08-2015:    Conclusion    Sequential SVG-OM2-OM3 was injected is large, and is anatomically normal.  Y Graft SVG-LAD from SVG-OM is large, and is anatomically normal. The downstream LAD is small caliber and diffusely diseased.  LM lesion, 100% stenosed.  Ost LAD to Prox LAD lesion, 100% stenosed.  Ost 2nd Mrg to 2nd Mrg lesion, 100% stenosed.  SVG was injected is small. JR4 Catheter  Prox Original SVG-OM1 100% stenosed.  Ost Cx to Mid Cx lesion, 100% stenosed.  Borderline LOW LVEDP   Impression:  Known severe native coronary disease. Disabled contrast the native arteries were not evaluated.  Occluded old SVG-OM1 - the dye remained standing in the proximal vessel for several minutes following angiography - not PCI amenable ? This is the likely culprit for the patient's episode of what sounds like anginal symptoms about a month ago. This may also be a cause of now worsening ventricular tachycardia.   Widely patent Y graft with one limb providing sequential grafting to OM1 and OM 2 as well as a separate limb serving as a graft to the LAD which retrograde fills the diagonal branch. This redo graft now provides the entirety of the patient's coronary circulation.  The graft obtuse marginal branches are relatively free of disease with retrograde filling back to the distal circumflex system which consists  of the posterolateral branch and left PDA.  The grafted LAD itself was a very diffusely diseased vessel, but no change from previous.  Known small nondominant right coronary artery system.  Borderline low LVEDP   Recommendations:  No PCI targets.  Standard post radial cath care with TR band removal  Would proceed with ICD placement per EP     ASSESSMENT & PLAN:   1.CAD - extensive  and long standing - he is managed medically - doing well - no changes made today. Lab today. He seems to be slowing down in a general fashion but ok from our standpoint.    2.HTN -BP up some here today - he has fairly good control at home - tends to have some lability but overall it looks fine - I have left him on his current regimen.   3. Chronic systolic HF - with reduced EF - his medical therapy has been limited by bradycardia and significant conduction disease -his symptoms are stable - no changes made today.   4.Paroxysmal ventricular tachycardia:He remainson a combination of mexiletine and Ranexa. No reports of palpitations reported.   5. Hyperlipidemia:remains on statin - no recent labs - will get today.   6. Chronic kidney disease stage UDJ:[id number] labs today.   7. Advanced age   . COVID-19 Education: The signs and symptoms of COVID-19 were discussed with the patient and how to seek care for testing (follow up with PCP or arrange E-visit).  The importance of social distancing, staying at home, hand hygiene and wearing a mask when out in public were discussed today.  Current medicines are reviewed with the patient today.  The patient does not have concerns regarding medicines other than what has been noted above.  The following changes have been made:  See above.  Labs/ tests ordered today include:   No orders of the defined types were placed in this encounter.    Disposition:   FU with *** in {gen number 8-85:[id number]} {Days to years:10300}.   Patient is  agreeable to this plan and will call if any problems develop in the interim.   SignedNorma Fredrickson, NP  05/19/2019 8:12 AM  [facility] [address] [place], [place]  [address] Phone: [phone] Fax: [phone]

## 2019-05-24 ENCOUNTER — Ambulatory Visit: Payer: Medicare PPO | Admitting: Nurse Practitioner

## 2019-06-01 NOTE — Progress Notes (Signed)
CARDIOLOGY OFFICE NOTE  Date:  06/06/2019     Karl Stevenson Date of Birth: September 03, 1926 Medical Record #[id number]  PCP:  [name], MD  Cardiologist:  Jerel Shepherd   Chief Complaint  Patient presents with  . Follow-up    Seen for Dr. Burt Knack    History of Present Illness: Karl Stevenson is a 86 y.o. male who presents today for a follow up visit. Seen for Dr. Burt Knack.   He has ahistory of coronary artery disease with redo CABG in 2008. He has chronic systolic heart failure with the severe ischemic cardiomyopathywithLVEF approximately 30 to 35%. Multiple comorbid conditions include peripheral arterial disease, hypertension, carotid stenosis with history of left carotid endarterectomy, abdominal aortic aneurysm, and ventricular tachycardia. He is followed by Dr. Caryl Comes and is managed with mexiletine and Ranexa. Amiodarone was discontinued because of bradycardia.  Sawlast sawDr. Burt Knack in [place] 2019andwas doing ok.He has stopped driving. Last seen by me here in the office back in November - he was felt to be stable overall.We did a telehealth visit back in May - seemed to be doing ok - having more issues with ambulation. Last seen here in September - he was holding his own.   The patient does not have symptoms concerning for COVID-19 infection (fever, chills, cough, or new shortness of breath).   Comes in today. Here alone. Doing ok. Not any chest pain. BP is ok - some lability but overall ok - he brought me his round of "ticker tape" that I reviewed - overall looks ok. Has had his first dose of COVID vaccine. Heart rate is ok. Using his cane. Trying to take care of Olive his wife - she may be having hip surgery. Tolerating his medicines. He is having his physical next week with labs. Overall, no real concerns.   Past Medical History:  Diagnosis Date  . AAA (abdominal aortic aneurysm) ([place])    a. Ectatic abdominal aorta with fusiform dilitation 3.4x3.4  and moderate thrombus burden 12/2010  . Bradycardia    a. previous intolerance to beta blockers - low dose toprol started 10/2011 for tachycardia but had rash and was discontinued;  b. tolerating low dose propranolol.  . Carotid artery occlusion    a. s/p L CEA in 2010;  b. 06/2014 Carotid U/S: stable LICA CEA site w/o significant stenosis bilat.  . Chronic systolic heart failure ([place])    a. 02/2015 Echo:  35-40% (echo 11/2010); c. 02/2015 Echo: EF 20-25%, diff HK, mid-apicalanteroseptal and apical AK, Gr 1 DD, mild MR.  [name] COPD (chronic obstructive pulmonary disease) ([place])   . Coronary artery disease    a. 1969 s/p MI;  b. S/P CABG 1987;  c. S/P redo 2008;  d. 07/2012 Cath: stable->Med Rx, EF 40%; e. 02/2014 MV: anterior and posterolateral/apical infarct with mild peri-infarct ischemia->Med Rx; f. 02/2015 Cath: LM 100d, LAD 100ost, LCX 100ost, OM2 100, VG->OM3->OM4 nl, VG-> LAD nl (Y from VG->OM), VG->OM2 100p.  . Crohn's disease ([place])    a. s/p colon resection  . Gout   . Hyperlipidemia   . Hypertension   . Ischemic cardiomyopathy    a. EF 34% (Lexiscan 06/2011); b. 35-40% (echo 11/2010); c. 02/2015 Echo: EF 20-25%, diff HK, mid-apicalanteroseptal and apical AK, Gr 1 DD, mild MR.  . Peripheral vascular disease ([place])    a. Carotid dz s/p L CEA, RAS, AAA, LE dz  . RBBB   . Renal artery stenosis ([place])  a. Dopplers 12/2010 - 1-59% R ostial renal artery stenosis, 2 L renal arteries both widely patent  . Syncope   . Tachycardia    a. 10/2011 - atrial tach vs avnrt - BB started.  . Ventricular tachycardia ([facility])    a. 02/2015 ->amio 200 daily added-->later switched to mexilitene.    Past Surgical History:  Procedure Laterality Date  . ABDOMINAL AORTAGRAM N/A 06/11/2011   Procedure: ABDOMINAL [name];  Surgeon: [name], MD;  Location: [facility] CATH LAB;  Service: Cardiovascular;  Laterality: N/A;  . CARDIAC CATHETERIZATION  07/28/2012   Native 3v CAD, continued patency of the SVG-OM1-LAD and  SVG-OM2-left PDA. LVEF 40% with multiple WMAs  . CARDIAC CATHETERIZATION N/A 03/08/2015   Procedure: Left Heart Cath and Coronary Angiography;  Surgeon: [name], MD;  Location: [facility] INVASIVE CV LAB;  Service: Cardiovascular;  Laterality: N/A;  . CAROTID ENDARTERECTOMY Left 2010  . CATARACT EXTRACTION, BILATERAL    . COLON RESECTION  2008  . COLON SURGERY    . CORONARY ANGIOPLASTY    . CORONARY ARTERY BYPASS GRAFT  10/31/85 & 08/19/06  . ENDARTERECTOMY  08/26/2011   Procedure: ENDARTERECTOMY ILIAC;  Surgeon: [name], MD;  Location: [facility] OR;  Service: Vascular;  Laterality: Right;  Right Femoral Artery Endarterectomy with vascu guard patch angioplasty & intraoperative arteriogram.  . EYE SURGERY    . FEMORAL ENDARTERECTOMY Left  06/2011   ileofemoral endarterectomy with bovine patch angioplasty  . LEFT HEART CATHETERIZATION WITH CORONARY ANGIOGRAM N/A 07/28/2012   Procedure: LEFT HEART CATHETERIZATION WITH CORONARY ANGIOGRAM;  Surgeon: [name], MD;  Location: [facility] CATH LAB;  Service: Cardiovascular;  Laterality: N/A;  . PR VEIN BYPASS GRAFT,AORTO-FEM-POP  10/1985  . TONSILLECTOMY     as a child     Medications: Current Meds  Medication Sig  . acetaminophen (TYLENOL) 500 MG tablet Take 1,000 mg by mouth every 6 (six) hours as needed (pain).   [name] aspirin EC 81 MG tablet Take 81 mg by mouth daily.  [name] atorvastatin (LIPITOR) 10 MG tablet Take 10 mg by mouth at bedtime.   . carboxymethylcellulose (REFRESH TEARS) 0.5 % SOLN Place 1 drop into both eyes 4 (four) times daily.  . clopidogrel (PLAVIX) 75 MG tablet TAKE 1 TABLET EVERY DAY  . ipratropium (ATROVENT) 0.03 % nasal spray Place 1 spray into both nostrils daily.   . isosorbide mononitrate (IMDUR) 120 MG 24 hr tablet TAKE 1 TABLET EVERY DAY  . isosorbide mononitrate (IMDUR) 60 MG 24 hr tablet TAKE 1 TABLET BY MOUTH ONCE DAILY IN ADDITION TO  ISOSORBIDE 120MG  TABLET  . losartan (COZAAR) 25 MG tablet TAKE 1 TABLET TWO  TIMES DAILY.    mexiletine (MEXITIL) 200 MG capsule TAKE 1 CAPSULE BY MOUTH EVERY 12 HOURS  . Multiple Vitamin (MULTIVITAMIN WITH MINERALS) TABS tablet Take 2 tablets by mouth daily.  . nitroGLYCERIN (NITROSTAT) 0.4 MG SL tablet Place 0.4 mg under the tongue every 5 (five) minutes as needed for chest pain.  . pantoprazole (PROTONIX) 40 MG tablet TAKE 1 TABLET EVERY DAY  . polyethylene glycol powder (GLYCOLAX/MIRALAX) powder Take 17 g by mouth at bedtime. Mix in 8 oz liquid and drink  . ranolazine (RANEXA) 500 MG 12 hr tablet TAKE 2 TABLETS TWICE DAILY  . vitamin B-12 (CYANOCOBALAMIN) 1000 MCG tablet Take 2,000 mcg by mouth daily.     Allergies: Allergies  Allergen Reactions  . Amoxicillin     diaharrea  . Novocain [Procaine Hcl] Other (  See Comments)    Cold sweats and very bad headache.   . Procaine Other (See Comments)    Reaction not recalled by the patient  . Metoprolol Hives, Itching and Rash    Social History: The patient  reports that he has never smoked. He has never used smokeless tobacco. He reports that he does not drink alcohol or use drugs.   Family History: The patient's family history includes CAD in his brother; COPD in his brother and mother; Cancer in his mother; Deep vein thrombosis in his brother; Heart disease in his father.   Review of Systems: Please see the history of present illness.   All other systems are reviewed and negative.   Physical Exam: VS:  BP (!) 150/70   Pulse 86   Ht 5\' 7"  (1.702 m)   Wt 137 lb 6.4 oz (62.3 kg)   SpO2 98%   BMI 21.52 kg/m  .  BMI Body mass index is 21.52 kg/m.  Wt Readings from Last 3 Encounters:  06/06/19 137 lb 6.4 oz (62.3 kg)  01/25/19 135 lb 12.8 oz (61.6 kg)  09/21/18 130 lb 8 oz (59.2 kg)    General: Pleasant. Elderly. Alert and in no acute distress. He looks good.  HEENT: Normal.  Neck: Supple, no JVD, carotid bruits, or masses noted.  Cardiac: Regular rate and rhythm. No murmurs, rubs, or gallops. No edema.   Respiratory:  Lungs are clear to auscultation bilaterally with normal work of breathing.  GI: Soft and nontender.  MS: No deformity or atrophy. Gait and ROM intact. Using a cane.  Skin: Warm and dry. Color is normal.  Neuro:  Strength and sensation are intact and no gross focal deficits noted.  Psych: Alert, appropriate and with normal affect.   LABORATORY DATA:  EKG:  EKG is not ordered today.   Lab Results  Component Value Date   WBC 5.1 01/25/2019   HGB 10.8 (L) 01/25/2019   HCT 33.0 (L) 01/25/2019   PLT 157 01/25/2019   GLUCOSE 94 01/25/2019   CHOL 116 01/25/2019   TRIG 259 (H) 01/25/2019   HDL 37 (L) 01/25/2019   LDLCALC 39 01/25/2019   ALT 13 01/25/2019   AST 20 01/25/2019   NA 141 01/25/2019   K 4.4 01/25/2019   CL 106 01/25/2019   CREATININE 1.30 (H) 01/25/2019   BUN 27 01/25/2019   CO2 21 01/25/2019   TSH 2.053 10/24/2015   INR 1.20 07/27/2015     BNP (last 3 results) No results for input(s): BNP in the last 8760 hours.  ProBNP (last 3 results) No results for input(s): PROBNP in the last 8760 hours.   Other Studies Reviewed Today:  Echo Study Conclusions from 11/2015  - Left ventricle: The cavity size was normal. Systolic function was moderately to severely reduced. The estimated ejection fraction was in the range of 30% to 35%. There is akinesis of the apicalanterior, lateral, inferolateral, inferior, and apical myocardium. There is akinesis of the midanteroseptal myocardium. There was an increased relative contribution of atrial contraction to ventricular filling. Doppler parameters are consistent with abnormal left ventricular relaxation (grade 1 diastolic dysfunction). - Aortic valve: Trileaflet; normal thickness, mildly calcified leaflets. - Mitral valve: Calcified annulus. There was mild regurgitation.   Cath 03-08-2015:    Conclusion    Sequential SVG-OM2-OM3 was injected is large, and is anatomically  normal.  Y Graft SVG-LAD from SVG-OM is large, and is anatomically normal. The downstream LAD is small caliber and diffusely  diseased.  LM lesion, 100% stenosed.  Ost LAD to Prox LAD lesion, 100% stenosed.  Ost 2nd Mrg to 2nd Mrg lesion, 100% stenosed.  SVG was injected is small. JR4 Catheter  Prox Original SVG-OM1 100% stenosed.  Ost Cx to Mid Cx lesion, 100% stenosed.  Borderline LOW LVEDP   Impression:  Known severe native coronary disease. Disabled contrast the native arteries were not evaluated.  Occluded old SVG-OM1 - the dye remained standing in the proximal vessel for several minutes following angiography - not PCI amenable ? This is the likely culprit for the patient's episode of what sounds like anginal symptoms about a month ago. This may also be a cause of now worsening ventricular tachycardia.   Widely patent Y graft with one limb providing sequential grafting to OM1 and OM 2 as well as a separate limb serving as a graft to the LAD which retrograde fills the diagonal branch. This redo graft now provides the entirety of the patient's coronary circulation.  The graft obtuse marginal branches are relatively free of disease with retrograde filling back to the distal circumflex system which consists of the posterolateral branch and left PDA.  The grafted LAD itself was a very diffusely diseased vessel, but no change from previous.  Known small nondominant right coronary artery system.  Borderline low LVEDP   Recommendations:  No PCI targets.  Standard post radial cath care with TR band removal  Would proceed with ICD placement per EP     ASSESSMENT & PLAN:   1.CAD - extensive and long standing - managed medically - actually doing pretty well - no changes made today.   2. HTN - BP is basically ok - no changes made today. Chronic lability but overall stable.   3. Chronic systolic HF - reduced EF - medical therapy limited by bradycardia and  significant conduction disease (bifascicular block).   4. Paroxysmal VT - remains on combination of Mexiletine and Ranexa. No palpitations noted.   5. Hyperlipidemia:remains on statin - no recent labs - will get today.   6. Chronic kidney disease stage III:lab by PCP.   7. Advanced age   40. COVID-19 Education: The signs and symptoms of COVID-19 were discussed with the patient and how to seek care for testing (follow up with PCP or arrange E-visit).  The importance of social distancing, staying at home, hand hygiene and wearing a mask when out in public were discussed today. He has had his first COVID vaccine.   Current medicines are reviewed with the patient today.  The patient does not have concerns regarding medicines other than what has been noted above.  The following changes have been made:  See above.  Labs/ tests ordered today include:   No orders of the defined types were placed in this encounter.    Disposition:   FU with me in 4 months with EKG.   Patient is agreeable to this plan and will call if any problems develop in the interim.   SignedNorma Fredrickson, NP  06/06/2019 4:36 PM  [facility] [address] [place], [place]  [address] Phone: [phone] Fax: [phone]

## 2019-06-06 ENCOUNTER — Other Ambulatory Visit: Payer: Self-pay

## 2019-06-06 ENCOUNTER — Encounter: Payer: Self-pay | Admitting: Nurse Practitioner

## 2019-06-06 ENCOUNTER — Ambulatory Visit: Payer: Medicare PPO | Admitting: Nurse Practitioner

## 2019-06-06 VITALS — BP 150/70 | HR 86 | Ht 67.0 in | Wt 137.4 lb

## 2019-06-06 DIAGNOSIS — N183 Chronic kidney disease, stage 3 unspecified: Secondary | ICD-10-CM | POA: Diagnosis not present

## 2019-06-06 DIAGNOSIS — Z951 Presence of aortocoronary bypass graft: Secondary | ICD-10-CM | POA: Diagnosis not present

## 2019-06-06 DIAGNOSIS — I5022 Chronic systolic (congestive) heart failure: Secondary | ICD-10-CM | POA: Diagnosis not present

## 2019-06-06 DIAGNOSIS — Z8249 Family history of ischemic heart disease and other diseases of the circulatory system: Secondary | ICD-10-CM

## 2019-06-06 DIAGNOSIS — I472 Ventricular tachycardia: Secondary | ICD-10-CM

## 2019-06-06 DIAGNOSIS — I255 Ischemic cardiomyopathy: Secondary | ICD-10-CM

## 2019-06-06 DIAGNOSIS — I130 Hypertensive heart and chronic kidney disease with heart failure and stage 1 through stage 4 chronic kidney disease, or unspecified chronic kidney disease: Secondary | ICD-10-CM | POA: Diagnosis not present

## 2019-06-06 DIAGNOSIS — I2510 Atherosclerotic heart disease of native coronary artery without angina pectoris: Secondary | ICD-10-CM

## 2019-06-06 DIAGNOSIS — Z7189 Other specified counseling: Secondary | ICD-10-CM | POA: Diagnosis not present

## 2019-06-06 DIAGNOSIS — I10 Essential (primary) hypertension: Secondary | ICD-10-CM

## 2019-06-06 DIAGNOSIS — E7800 Pure hypercholesterolemia, unspecified: Secondary | ICD-10-CM

## 2019-06-06 NOTE — Patient Instructions (Addendum)
After Visit Summary:  We will be checking the following labs today -  NONE   Medication Instructions:    Continue with your current medicines.    If you need a refill on your cardiac medications before your next appointment, please call your pharmacy.     Testing/Procedures To Be Arranged:  N/A  Follow-Up:   See me in 4 months with EKG    At [facility], you and your health needs are our priority.  As part of our continuing mission to provide you with exceptional heart care, we have created designated Provider Care Teams.  These Care Teams include your primary Cardiologist (physician) and Advanced Practice Providers (APPs -  Physician Assistants and Nurse Practitioners) who all work together to provide you with the care you need, when you need it.  Special Instructions:  . Stay safe, stay home, wash your hands for at least 20 seconds and wear a mask when out in public.  . It was good to talk with you today.    Call the [facility] office at [phone] if you have any questions, problems or concerns.       

## 2019-06-23 ENCOUNTER — Other Ambulatory Visit: Payer: Self-pay | Admitting: Cardiovascular Disease

## 2019-07-25 DIAGNOSIS — E538 Deficiency of other specified B group vitamins: Secondary | ICD-10-CM | POA: Diagnosis not present

## 2019-07-25 DIAGNOSIS — E7849 Other hyperlipidemia: Secondary | ICD-10-CM | POA: Diagnosis not present

## 2019-07-25 DIAGNOSIS — H6121 Impacted cerumen, right ear: Secondary | ICD-10-CM | POA: Diagnosis not present

## 2019-07-25 DIAGNOSIS — M109 Gout, unspecified: Secondary | ICD-10-CM | POA: Diagnosis not present

## 2019-07-25 DIAGNOSIS — Z0000 Encounter for general adult medical examination without abnormal findings: Secondary | ICD-10-CM | POA: Diagnosis not present

## 2019-07-25 DIAGNOSIS — H6123 Impacted cerumen, bilateral: Secondary | ICD-10-CM | POA: Insufficient documentation

## 2019-07-25 DIAGNOSIS — Z125 Encounter for screening for malignant neoplasm of prostate: Secondary | ICD-10-CM | POA: Diagnosis not present

## 2019-08-01 DIAGNOSIS — I2510 Atherosclerotic heart disease of native coronary artery without angina pectoris: Secondary | ICD-10-CM | POA: Diagnosis not present

## 2019-08-01 DIAGNOSIS — K5090 Crohn's disease, unspecified, without complications: Secondary | ICD-10-CM | POA: Diagnosis not present

## 2019-08-01 DIAGNOSIS — Z0000 Encounter for general adult medical examination without abnormal findings: Secondary | ICD-10-CM | POA: Diagnosis not present

## 2019-08-01 DIAGNOSIS — E538 Deficiency of other specified B group vitamins: Secondary | ICD-10-CM | POA: Diagnosis not present

## 2019-08-01 DIAGNOSIS — I255 Ischemic cardiomyopathy: Secondary | ICD-10-CM | POA: Diagnosis not present

## 2019-08-01 DIAGNOSIS — Z1331 Encounter for screening for depression: Secondary | ICD-10-CM | POA: Diagnosis not present

## 2019-08-01 DIAGNOSIS — I739 Peripheral vascular disease, unspecified: Secondary | ICD-10-CM | POA: Diagnosis not present

## 2019-08-01 DIAGNOSIS — M109 Gout, unspecified: Secondary | ICD-10-CM | POA: Diagnosis not present

## 2019-08-01 DIAGNOSIS — G629 Polyneuropathy, unspecified: Secondary | ICD-10-CM | POA: Diagnosis not present

## 2019-08-01 DIAGNOSIS — K5909 Other constipation: Secondary | ICD-10-CM | POA: Diagnosis not present

## 2019-08-01 DIAGNOSIS — Z1339 Encounter for screening examination for other mental health and behavioral disorders: Secondary | ICD-10-CM | POA: Diagnosis not present

## 2019-09-20 DIAGNOSIS — L570 Actinic keratosis: Secondary | ICD-10-CM | POA: Diagnosis not present

## 2019-09-20 DIAGNOSIS — D044 Carcinoma in situ of skin of scalp and neck: Secondary | ICD-10-CM | POA: Diagnosis not present

## 2019-09-20 DIAGNOSIS — X32XXXD Exposure to sunlight, subsequent encounter: Secondary | ICD-10-CM | POA: Diagnosis not present

## 2019-09-20 NOTE — Progress Notes (Signed)
CARDIOLOGY OFFICE NOTE  Date:  09/27/2019    Karl Stevenson Date of Birth: 12-27-1926 Medical Record #[id number]  PCP:  Jarome Matin, MD  Cardiologist:  Kirt Boys  No chief complaint on file.   History of Present Illness: Karl Stevenson is a 86 y.o. male who presents today for a follow up visit. This is a 4 month check.  Seen for Dr. Excell Seltzer.   He has ahistory of coronary artery disease with redo CABG in 2008. He has chronic systolic heart failure with the severe ischemic cardiomyopathywithLVEF approximately 30 to 35%. Multiple comorbid conditions include peripheral arterial disease, hypertension, carotid stenosis with history of left carotid endarterectomy, abdominal aortic aneurysm, and ventricular tachycardia. He has been followed by Dr. Graciela Husbands and is managed with mexiletine and Ranexa. Amiodarone was discontinued because of bradycardia.  Sawlast sawDr. Excell Seltzer in [place] 2019andwas doing ok.He has stopped driving. Last seen here in January by me- doing ok overall.   The patient does not have symptoms concerning for COVID-19 infection (fever, chills, cough, or new shortness of breath).   Comes in today. Here alone but Olive his wife was in the lobby - she is back driving them - she had her hip surgery - she did really well. She is 92. He feels good. No chest pain. His breathing is stable. He brought in his ticker tape of his BP and HR's - basically all look good. He has had significant place cut out of the top of his head from the dermatology. He tells me he has had a fall - his knee gave out - fortunately, he was not hurt. He had lab earlier this year with PCP that were reviewed. He feels like overall he is doing ok.    Past Medical History:  Diagnosis Date  . AAA (abdominal aortic aneurysm) (HCC)    a. Ectatic abdominal aorta with fusiform dilitation 3.4x3.4 and moderate thrombus burden 12/2010  . Bradycardia    a. previous intolerance to beta  blockers - low dose toprol started 10/2011 for tachycardia but had rash and was discontinued;  b. tolerating low dose propranolol.  . Carotid artery occlusion    a. s/p L CEA in 2010;  b. 06/2014 Carotid U/S: stable LICA CEA site w/o significant stenosis bilat.  . Chronic systolic heart failure (HCC)    a. 02/2015 Echo:  35-40% (echo 11/2010); c. 02/2015 Echo: EF 20-25%, diff HK, mid-apicalanteroseptal and apical AK, Gr 1 DD, mild MR.  [name] COPD (chronic obstructive pulmonary disease) (HCC)   . Coronary artery disease    a. 1969 s/p MI;  b. S/P CABG 1987;  c. S/P redo 2008;  d. 07/2012 Cath: stable->Med Rx, EF 40%; e. 02/2014 MV: anterior and posterolateral/apical infarct with mild peri-infarct ischemia->Med Rx; f. 02/2015 Cath: LM 100d, LAD 100ost, LCX 100ost, OM2 100, VG->OM3->OM4 nl, VG-> LAD nl (Y from VG->OM), VG->OM2 100p.  . Crohn's disease (HCC)    a. s/p colon resection  . Gout   . Hyperlipidemia   . Hypertension   . Ischemic cardiomyopathy    a. EF 34% (Lexiscan 06/2011); b. 35-40% (echo 11/2010); c. 02/2015 Echo: EF 20-25%, diff HK, mid-apicalanteroseptal and apical AK, Gr 1 DD, mild MR.  . Peripheral vascular disease (HCC)    a. Carotid dz s/p L CEA, RAS, AAA, LE dz  . RBBB   . Renal artery stenosis (HCC)    a. Dopplers 12/2010 - 1-59% R ostial renal artery stenosis, 2 L renal  arteries both widely patent  . Syncope   . Tachycardia    a. 10/2011 - atrial tach vs avnrt - BB started.  . Ventricular tachycardia (HCC)    a. 02/2015 ->amio 200 daily added-->later switched to mexilitene.    Past Surgical History:  Procedure Laterality Date  . ABDOMINAL AORTAGRAM N/A 06/11/2011   Procedure: ABDOMINAL [name];  Surgeon: [name], MD;  Location: [facility] CATH LAB;  Service: Cardiovascular;  Laterality: N/A;  . CARDIAC CATHETERIZATION  07/28/2012   Native 3v CAD, continued patency of the SVG-OM1-LAD and SVG-OM2-left PDA. LVEF 40% with multiple WMAs  . CARDIAC CATHETERIZATION N/A 03/08/2015    Procedure: Left Heart Cath and Coronary Angiography;  Surgeon: [name], MD;  Location: [facility] INVASIVE CV LAB;  Service: Cardiovascular;  Laterality: N/A;  . CAROTID ENDARTERECTOMY Left 2010  . CATARACT EXTRACTION, BILATERAL    . COLON RESECTION  2008  . COLON SURGERY    . CORONARY ANGIOPLASTY    . CORONARY ARTERY BYPASS GRAFT  10/31/85 & 08/19/06  . ENDARTERECTOMY  08/26/2011   Procedure: ENDARTERECTOMY ILIAC;  Surgeon: [name], MD;  Location: [facility] OR;  Service: Vascular;  Laterality: Right;  Right Femoral Artery Endarterectomy with vascu guard patch angioplasty & intraoperative arteriogram.  . EYE SURGERY    . FEMORAL ENDARTERECTOMY Left  06/2011   ileofemoral endarterectomy with bovine patch angioplasty  . LEFT HEART CATHETERIZATION WITH CORONARY ANGIOGRAM N/A 07/28/2012   Procedure: LEFT HEART CATHETERIZATION WITH CORONARY ANGIOGRAM;  Surgeon: [name], MD;  Location: [facility] CATH LAB;  Service: Cardiovascular;  Laterality: N/A;  . PR VEIN BYPASS GRAFT,AORTO-FEM-POP  10/1985  . TONSILLECTOMY     as a child     Medications: Current Meds  Medication Sig  . acetaminophen (TYLENOL) 500 MG tablet Take 1,000 mg by mouth every 6 (six) hours as needed (pain).   [name] aspirin EC 81 MG tablet Take 81 mg by mouth daily.  [name] atorvastatin (LIPITOR) 10 MG tablet Take 10 mg by mouth at bedtime.   . carboxymethylcellulose (REFRESH TEARS) 0.5 % SOLN Place 1 drop into both eyes 4 (four) times daily.  . clopidogrel (PLAVIX) 75 MG tablet TAKE 1 TABLET EVERY DAY  . cyclobenzaprine (FLEXERIL) 10 MG tablet   . ipratropium (ATROVENT) 0.03 % nasal spray Place 1 spray into both nostrils daily.   . isosorbide mononitrate (IMDUR) 120 MG 24 hr tablet TAKE 1 TABLET EVERY DAY  . isosorbide mononitrate (IMDUR) 60 MG 24 hr tablet TAKE 1 TABLET BY MOUTH ONCE DAILY IN ADDITION TO  ISOSORBIDE 120MG  TABLET  . losartan (COZAAR) 25 MG tablet TAKE 1 TABLET TWICE DAILY  . mexiletine (MEXITIL) 200 MG capsule TAKE 1 CAPSULE  BY MOUTH EVERY 12 HOURS  . Multiple Vitamin (MULTIVITAMIN WITH MINERALS) TABS tablet Take 2 tablets by mouth daily.  . nitroGLYCERIN (NITROSTAT) 0.4 MG SL tablet Place 0.4 mg under the tongue every 5 (five) minutes as needed for chest pain.  . pantoprazole (PROTONIX) 40 MG tablet TAKE 1 TABLET EVERY DAY  . polyethylene glycol powder (GLYCOLAX/MIRALAX) powder Take 17 g by mouth at bedtime. Mix in 8 oz liquid and drink  . ranolazine (RANEXA) 500 MG 12 hr tablet TAKE 2 TABLETS TWICE DAILY  . vitamin B-12 (CYANOCOBALAMIN) 1000 MCG tablet Take 2,000 mcg by mouth daily.     Allergies: Allergies  Allergen Reactions  . Amoxicillin     diaharrea  . Novocain [Procaine Hcl] Other (See Comments)    Cold sweats and  very bad headache.   . Procaine Other (See Comments)    Reaction not recalled by the patient  . Metoprolol Hives, Itching and Rash    Social History: The patient  reports that he has never smoked. He has never used smokeless tobacco. He reports that he does not drink alcohol or use drugs.   Family History: The patient's family history includes CAD in his brother; COPD in his brother and mother; Cancer in his mother; Deep vein thrombosis in his brother; Heart disease in his father.   Review of Systems: Please see the history of present illness.   All other systems are reviewed and negative.   Physical Exam: VS:  BP 126/74   Pulse 78   Ht 5\' 7"  (1.702 m)   Wt 136 lb 12.8 oz (62.1 kg)   SpO2 98%   BMI 21.43 kg/m  .  BMI Body mass index is 21.43 kg/m.  Wt Readings from Last 3 Encounters:  09/27/19 136 lb 12.8 oz (62.1 kg)  06/06/19 137 lb 6.4 oz (62.3 kg)  01/25/19 135 lb 12.8 oz (61.6 kg)    General: Pleasant. Elderly. Alert and in no acute distress. He has his cane. He looks thinner but his weight is stable.   HEENT: Normal. There is a 50 cent area on the top of his head from recent dermatology procedure.  Cardiac: Regular rate and rhythm. No murmurs, rubs, or gallops. No  edema.  Respiratory:  Lungs are clear to auscultation bilaterally with normal work of breathing.  GI: Soft and nontender.  MS: No deformity or atrophy. Gait and ROM intact.  Skin: Warm and dry. Color is normal.  Neuro:  Strength and sensation are intact and no gross focal deficits noted.  Psych: Alert, appropriate and with normal affect.   LABORATORY DATA:  EKG:  EKG is ordered today.  Personally reviewed by me. This demonstrates sinus with 1st degree AV block - RBBB - bifascicular block.  Lab Results  Component Value Date   WBC 5.1 01/25/2019   HGB 10.8 (L) 01/25/2019   HCT 33.0 (L) 01/25/2019   PLT 157 01/25/2019   GLUCOSE 94 01/25/2019   CHOL 116 01/25/2019   TRIG 259 (H) 01/25/2019   HDL 37 (L) 01/25/2019   LDLCALC 39 01/25/2019   ALT 13 01/25/2019   AST 20 01/25/2019   NA 141 01/25/2019   K 4.4 01/25/2019   CL 106 01/25/2019   CREATININE 1.30 (H) 01/25/2019   BUN 27 01/25/2019   CO2 21 01/25/2019   TSH 2.053 10/24/2015   INR 1.20 07/27/2015       BNP (last 3 results) No results for input(s): BNP in the last 8760 hours.  ProBNP (last 3 results) No results for input(s): PROBNP in the last 8760 hours.   Other Studies Reviewed Today:   Echo Study Conclusions from 11/2015  - Left ventricle: The cavity size was normal. Systolic function was moderately to severely reduced. The estimated ejection fraction was in the range of 30% to 35%. There is akinesis of the apicalanterior, lateral, inferolateral, inferior, and apical myocardium. There is akinesis of the midanteroseptal myocardium. There was an increased relative contribution of atrial contraction to ventricular filling. Doppler parameters are consistent with abnormal left ventricular relaxation (grade 1 diastolic dysfunction). - Aortic valve: Trileaflet; normal thickness, mildly calcified leaflets. - Mitral valve: Calcified annulus. There was mild regurgitation.   Cath  03-08-2015:    Conclusion    Sequential SVG-OM2-OM3 was injected is large, and  is anatomically normal.  Y Graft SVG-LAD from SVG-OM is large, and is anatomically normal. The downstream LAD is small caliber and diffusely diseased.  LM lesion, 100% stenosed.  Ost LAD to Prox LAD lesion, 100% stenosed.  Ost 2nd Mrg to 2nd Mrg lesion, 100% stenosed.  SVG was injected is small. JR4 Catheter  Prox Original SVG-OM1 100% stenosed.  Ost Cx to Mid Cx lesion, 100% stenosed.  Borderline LOW LVEDP   Impression:  Known severe native coronary disease. Disabled contrast the native arteries were not evaluated.  Occluded old SVG-OM1 - the dye remained standing in the proximal vessel for several minutes following angiography - not PCI amenable ? This is the likely culprit for the patient's episode of what sounds like anginal symptoms about a month ago. This may also be a cause of now worsening ventricular tachycardia.   Widely patent Y graft with one limb providing sequential grafting to OM1 and OM 2 as well as a separate limb serving as a graft to the LAD which retrograde fills the diagonal branch. This redo graft now provides the entirety of the patient's coronary circulation.  The graft obtuse marginal branches are relatively free of disease with retrograde filling back to the distal circumflex system which consists of the posterolateral branch and left PDA.  The grafted LAD itself was a very diffusely diseased vessel, but no change from previous.  Known small nondominant right coronary artery system.  Borderline low LVEDP   Recommendations:  No PCI targets.  Standard post radial cath care with TR band removal  Would proceed with ICD placement per EP     ASSESSMENT & PLAN:    1.CAD - extensive and long standing with remote CABG - he is managed medically - still doing quite well - no changes made today.   2. HTN - BP is good - I have left him on his current  regimen.   3. Chronic systolic HF - with reduced EF - medical therapy limited by bradycardia and significant conduction disease - fortunately he continues to do really well - no changes made today.   4. Significant conduction disease - no syncope noted. His BP and HR are doing ok with his current regimen.   5. CKD - lab by PCP  6. Advanced age  56. COVID-19 Education: The signs and symptoms of COVID-19 were discussed with the patient and how to seek care for testing (follow up with PCP or arrange E-visit).  The importance of social distancing, staying at home, hand hygiene and wearing a mask when out in public were discussed today. He has been vaccinated.   Current medicines are reviewed with the patient today.  The patient does not have concerns regarding medicines other than what has been noted above.  The following changes have been made:  See above.  Labs/ tests ordered today include:    Orders Placed This Encounter  Procedures  . EKG 12-Lead     Disposition:   FU with me in 4 months. Overall, he seems to be holding his own.    Patient is agreeable to this plan and will call if any problems develop in the interim.   SignedTruitt Merle, NP  09/27/2019 [place] [address] [place], [place]  [address] Phone: [phone] Fax: [phone]

## 2019-09-22 ENCOUNTER — Telehealth: Payer: Self-pay | Admitting: Nurse Practitioner

## 2019-09-22 NOTE — Telephone Encounter (Signed)
I spoke with patient. He requests his wife come with him to appointment next week to help him. Will make note on appointment that wife will accompany patient.

## 2019-09-22 NOTE — Telephone Encounter (Signed)
New Message:    Pt have an appt on 09-27-19 with Norma Fredrickson. He wants his wife to come in with him, he needs her help.

## 2019-09-27 ENCOUNTER — Other Ambulatory Visit: Payer: Self-pay

## 2019-09-27 ENCOUNTER — Encounter: Payer: Self-pay | Admitting: Nurse Practitioner

## 2019-09-27 ENCOUNTER — Ambulatory Visit: Payer: Medicare PPO | Admitting: Nurse Practitioner

## 2019-09-27 VITALS — BP 126/74 | HR 78 | Ht 67.0 in | Wt 136.8 lb

## 2019-09-27 DIAGNOSIS — I255 Ischemic cardiomyopathy: Secondary | ICD-10-CM | POA: Diagnosis not present

## 2019-09-27 DIAGNOSIS — Z7189 Other specified counseling: Secondary | ICD-10-CM | POA: Diagnosis not present

## 2019-09-27 DIAGNOSIS — I10 Essential (primary) hypertension: Secondary | ICD-10-CM | POA: Diagnosis not present

## 2019-09-27 DIAGNOSIS — I472 Ventricular tachycardia, unspecified: Secondary | ICD-10-CM

## 2019-09-27 DIAGNOSIS — I2510 Atherosclerotic heart disease of native coronary artery without angina pectoris: Secondary | ICD-10-CM | POA: Diagnosis not present

## 2019-09-27 DIAGNOSIS — I5022 Chronic systolic (congestive) heart failure: Secondary | ICD-10-CM

## 2019-09-27 DIAGNOSIS — E7800 Pure hypercholesterolemia, unspecified: Secondary | ICD-10-CM

## 2019-09-27 NOTE — Patient Instructions (Signed)
After Visit Summary:  We will be checking the following labs today - NONE   Medication Instructions:    Continue with your current medicines.    If you need a refill on your cardiac medications before your next appointment, please call your pharmacy.     Testing/Procedures To Be Arranged:  N/A  Follow-Up:   See me in 4 months    At [facility], you and your health needs are our priority.  As part of our continuing mission to provide you with exceptional heart care, we have created designated Provider Care Teams.  These Care Teams include your primary Cardiologist (physician) and Advanced Practice Providers (APPs -  Physician Assistants and Nurse Practitioners) who all work together to provide you with the care you need, when you need it.  Special Instructions:  . Stay safe, stay home, wash your hands for at least 20 seconds and wear a mask when out in public.  . It was good to talk with you today.    Call the [facility] office at [phone] if you have any questions, problems or concerns.       

## 2019-10-17 DIAGNOSIS — I10 Essential (primary) hypertension: Secondary | ICD-10-CM | POA: Diagnosis not present

## 2019-10-17 DIAGNOSIS — R82998 Other abnormal findings in urine: Secondary | ICD-10-CM | POA: Diagnosis not present

## 2019-10-24 DIAGNOSIS — L570 Actinic keratosis: Secondary | ICD-10-CM | POA: Diagnosis not present

## 2019-10-24 DIAGNOSIS — Z85828 Personal history of other malignant neoplasm of skin: Secondary | ICD-10-CM | POA: Diagnosis not present

## 2019-10-24 DIAGNOSIS — Z08 Encounter for follow-up examination after completed treatment for malignant neoplasm: Secondary | ICD-10-CM | POA: Diagnosis not present

## 2019-10-24 DIAGNOSIS — X32XXXD Exposure to sunlight, subsequent encounter: Secondary | ICD-10-CM | POA: Diagnosis not present

## 2019-11-25 ENCOUNTER — Other Ambulatory Visit: Payer: Self-pay | Admitting: Cardiovascular Disease

## 2020-01-09 ENCOUNTER — Telehealth: Payer: Self-pay | Admitting: Nurse Practitioner

## 2020-01-09 MED ORDER — MEXILETINE HCL 200 MG PO CAPS: 200.0000 mg | ORAL_CAPSULE | Freq: Two times a day (BID) | ORAL | 2 refills | Status: DC

## 2020-01-09 NOTE — Telephone Encounter (Signed)
*  STAT* If patient is at the pharmacy, call can be transferred to refill team.   1. Which medications need to be refilled? (please list name of each medication and dose if known) mexiletine 200 mg  2. Which pharmacy/location (including street and city if local pharmacy) is medication to be sent to? [facility] pharmacy  3. Do they need a 30 day or 90 day supply? 90

## 2020-01-09 NOTE — Telephone Encounter (Signed)
Pt's medication was sent to pt's pharmacy as requested. Confirmation received.  °

## 2020-01-17 ENCOUNTER — Other Ambulatory Visit: Payer: Self-pay | Admitting: Cardiovascular Disease

## 2020-01-22 NOTE — Progress Notes (Signed)
CARDIOLOGY OFFICE NOTE  Date:  01/31/2020    Karl Stevenson Date of Birth: 27-Sep-1926 Medical Record #[id number]  PCP:  Jarome Matin, MD  Cardiologist:  Kirt Boys   Chief Complaint  Patient presents with  . Follow-up    History of Present Illness: Karl Stevenson is a 86 y.o. male who presents today for a 4 month check.  Seen for Dr. Excell Seltzer.   He has ahistory of coronary artery disease with redo CABG in 2008. He has chronic systolic heart failure with the severe ischemic cardiomyopathywithLVEF approximately 30 to 35%. Multiple comorbid conditions include peripheral arterial disease, hypertension, carotid stenosis with history of left carotid endarterectomy, abdominal aortic aneurysm, and ventricular tachycardia. He has been followed by Dr. Graciela Husbands and is managed with mexiletine and Ranexa for the past several years. Amiodarone was discontinued because of bradycardia.  Sawlast sawDr. Excell Seltzer in [place] 2019andwas doing ok.He has stopped driving. Last seen here in May by me - doing ok but had had a fall - fortunately no injury. Cardiac status ok.   Comes in today. Here with Olive his wife today. He gets weak in his legs a lot - no real chest pain - just weak in the legs - does not fall - trying to be careful. Balance is poor. Not dizzy. He brought me his ticker tape of his BPs and HRs again - these are good for the most part. He has some spells of where he gets diaphoretic at times - he has done this for many years - this does not happen very much fortunately, not associated with chest pain - BP may be a bit higher but then says the HR is higher and then says BP stays about the same - little unclear but seems to be at his baseline. They did not record the last reading when this happened. They are both happy with how he is doing overall. He does wish to have his NTG sl refilled - he is not using.   Past Medical History:  Diagnosis Date  . AAA (abdominal aortic  aneurysm) (HCC)    a. Ectatic abdominal aorta with fusiform dilitation 3.4x3.4 and moderate thrombus burden 12/2010  . Bradycardia    a. previous intolerance to beta blockers - low dose toprol started 10/2011 for tachycardia but had rash and was discontinued;  b. tolerating low dose propranolol.  . Carotid artery occlusion    a. s/p L CEA in 2010;  b. 06/2014 Carotid U/S: stable LICA CEA site w/o significant stenosis bilat.  . Chronic systolic heart failure (HCC)    a. 02/2015 Echo:  35-40% (echo 11/2010); c. 02/2015 Echo: EF 20-25%, diff HK, mid-apicalanteroseptal and apical AK, Gr 1 DD, mild MR.  [name] COPD (chronic obstructive pulmonary disease) (HCC)   . Coronary artery disease    a. 1969 s/p MI;  b. S/P CABG 1987;  c. S/P redo 2008;  d. 07/2012 Cath: stable->Med Rx, EF 40%; e. 02/2014 MV: anterior and posterolateral/apical infarct with mild peri-infarct ischemia->Med Rx; f. 02/2015 Cath: LM 100d, LAD 100ost, LCX 100ost, OM2 100, VG->OM3->OM4 nl, VG-> LAD nl (Y from VG->OM), VG->OM2 100p.  . Crohn's disease (HCC)    a. s/p colon resection  . Gout   . Hyperlipidemia   . Hypertension   . Ischemic cardiomyopathy    a. EF 34% (Lexiscan 06/2011); b. 35-40% (echo 11/2010); c. 02/2015 Echo: EF 20-25%, diff HK, mid-apicalanteroseptal and apical AK, Gr 1 DD, mild MR.  [name]  Peripheral vascular disease (HCC)    a. Carotid dz s/p L CEA, RAS, AAA, LE dz  . RBBB   . Renal artery stenosis (HCC)    a. Dopplers 12/2010 - 1-59% R ostial renal artery stenosis, 2 L renal arteries both widely patent  . Syncope   . Tachycardia    a. 10/2011 - atrial tach vs avnrt - BB started.  . Ventricular tachycardia (HCC)    a. 02/2015 ->amio 200 daily added-->later switched to mexilitene.    Past Surgical History:  Procedure Laterality Date  . ABDOMINAL AORTAGRAM N/A 06/11/2011   Procedure: ABDOMINAL [name];  Surgeon: [name], MD;  Location: [facility] CATH LAB;  Service: Cardiovascular;  Laterality: N/A;  . CARDIAC  CATHETERIZATION  07/28/2012   Native 3v CAD, continued patency of the SVG-OM1-LAD and SVG-OM2-left PDA. LVEF 40% with multiple WMAs  . CARDIAC CATHETERIZATION N/A 03/08/2015   Procedure: Left Heart Cath and Coronary Angiography;  Surgeon: [name], MD;  Location: [facility] INVASIVE CV LAB;  Service: Cardiovascular;  Laterality: N/A;  . CAROTID ENDARTERECTOMY Left 2010  . CATARACT EXTRACTION, BILATERAL    . COLON RESECTION  2008  . COLON SURGERY    . CORONARY ANGIOPLASTY    . CORONARY ARTERY BYPASS GRAFT  10/31/85 & 08/19/06  . ENDARTERECTOMY  08/26/2011   Procedure: ENDARTERECTOMY ILIAC;  Surgeon: [name], MD;  Location: [facility] OR;  Service: Vascular;  Laterality: Right;  Right Femoral Artery Endarterectomy with vascu guard patch angioplasty & intraoperative arteriogram.  . EYE SURGERY    . FEMORAL ENDARTERECTOMY Left  06/2011   ileofemoral endarterectomy with bovine patch angioplasty  . LEFT HEART CATHETERIZATION WITH CORONARY ANGIOGRAM N/A 07/28/2012   Procedure: LEFT HEART CATHETERIZATION WITH CORONARY ANGIOGRAM;  Surgeon: [name], MD;  Location: [facility] CATH LAB;  Service: Cardiovascular;  Laterality: N/A;  . PR VEIN BYPASS GRAFT,AORTO-FEM-POP  10/1985  . TONSILLECTOMY     as a child     Medications: Current Meds  Medication Sig  . acetaminophen (TYLENOL) 500 MG tablet Take 1,000 mg by mouth every 6 (six) hours as needed (pain).   [name] aspirin EC 81 MG tablet Take 81 mg by mouth daily.  [name] atorvastatin (LIPITOR) 10 MG tablet Take 10 mg by mouth at bedtime.   . carboxymethylcellulose (REFRESH TEARS) 0.5 % SOLN Place 1 drop into both eyes 4 (four) times daily.  . clopidogrel (PLAVIX) 75 MG tablet TAKE 1 TABLET EVERY DAY  . cyclobenzaprine (FLEXERIL) 10 MG tablet   . ipratropium (ATROVENT) 0.03 % nasal spray Place 1 spray into both nostrils daily.   . isosorbide mononitrate (IMDUR) 120 MG 24 hr tablet TAKE 1 TABLET EVERY DAY  . isosorbide mononitrate (IMDUR) 60 MG 24 hr tablet TAKE 1  TABLET BY MOUTH ONCE DAILY IN ADDITION TO  ISOSORBIDE 120MG  TABLET  . losartan (COZAAR) 25 MG tablet TAKE 1 TABLET TWICE DAILY  . mexiletine (MEXITIL) 200 MG capsule Take 1 capsule (200 mg total) by mouth every 12 (twelve) hours.  . Multiple Vitamin (MULTIVITAMIN WITH MINERALS) TABS tablet Take 2 tablets by mouth daily.  . nitroGLYCERIN (NITROSTAT) 0.4 MG SL tablet Place 1 tablet (0.4 mg total) under the tongue every 5 (five) minutes as needed for chest pain.  . pantoprazole (PROTONIX) 40 MG tablet TAKE 1 TABLET EVERY DAY  . polyethylene glycol powder (GLYCOLAX/MIRALAX) powder Take 17 g by mouth at bedtime. Mix in 8 oz liquid and drink  . ranolazine (RANEXA) 500 MG 12 hr tablet  TAKE 2 TABLETS TWICE DAILY  . vitamin B-12 (CYANOCOBALAMIN) 1000 MCG tablet Take 2,000 mcg by mouth daily.  . [DISCONTINUED] nitroGLYCERIN (NITROSTAT) 0.4 MG SL tablet Place 0.4 mg under the tongue every 5 (five) minutes as needed for chest pain.     Allergies: Allergies  Allergen Reactions  . Amoxicillin     diaharrea  . Novocain [Procaine Hcl] Other (See Comments)    Cold sweats and very bad headache.   . Procaine Other (See Comments)    Reaction not recalled by the patient  . Metoprolol Hives, Itching and Rash    Social History: The patient  reports that he has never smoked. He has never used smokeless tobacco. He reports that he does not drink alcohol and does not use drugs.   Family History: The patient's family history includes CAD in his brother; COPD in his brother and mother; Cancer in his mother; Deep vein thrombosis in his brother; Heart disease in his father.   Review of Systems: Please see the history of present illness.   All other systems are reviewed and negative.   Physical Exam: VS:  BP (!) 160/80   Pulse 79   Ht 5\' 7"  (1.702 m)   Wt 136 lb 6.4 oz (61.9 kg)   SpO2 99%   BMI 21.36 kg/m  .  BMI Body mass index is 21.36 kg/m.  Wt Readings from Last 3 Encounters:  01/31/20 136 lb 6.4  oz (61.9 kg)  09/27/19 136 lb 12.8 oz (62.1 kg)  06/06/19 137 lb 6.4 oz (62.3 kg)    General: Pleasant. Well developed, well nourished and in no acute distress.   HEENT: Normal.  Neck: Supple, no JVD, carotid bruits, or masses noted.  Cardiac: Regular rate and rhythm. No murmurs, rubs, or gallops. No edema.  Respiratory:  Lungs are clear to auscultation bilaterally with normal work of breathing.  GI: Soft and nontender.  MS: No deformity or atrophy. Gait and ROM intact.  Skin: Warm and dry. Color is normal.  Neuro:  Strength and sensation are intact and no gross focal deficits noted.  Psych: Alert, appropriate and with normal affect.   LABORATORY DATA:  EKG:  EKG is not ordered today.    Lab Results  Component Value Date   WBC 5.1 01/25/2019   HGB 10.8 (L) 01/25/2019   HCT 33.0 (L) 01/25/2019   PLT 157 01/25/2019   GLUCOSE 94 01/25/2019   CHOL 116 01/25/2019   TRIG 259 (H) 01/25/2019   HDL 37 (L) 01/25/2019   LDLCALC 39 01/25/2019   ALT 13 01/25/2019   AST 20 01/25/2019   NA 141 01/25/2019   K 4.4 01/25/2019   CL 106 01/25/2019   CREATININE 1.30 (H) 01/25/2019   BUN 27 01/25/2019   CO2 21 01/25/2019   TSH 2.053 10/24/2015   INR 1.20 07/27/2015       BNP (last 3 results) No results for input(s): BNP in the last 8760 hours.  ProBNP (last 3 results) No results for input(s): PROBNP in the last 8760 hours.   Other Studies Reviewed Today:  Echo Study Conclusions from 11/2015  - Left ventricle: The cavity size was normal. Systolic function was moderately to severely reduced. The estimated ejection fraction was in the range of 30% to 35%. There is akinesis of the apicalanterior, lateral, inferolateral, inferior, and apical myocardium. There is akinesis of the midanteroseptal myocardium. There was an increased relative contribution of atrial contraction to ventricular filling. Doppler parameters are consistent with  abnormal left ventricular  relaxation (grade 1 diastolic dysfunction). - Aortic valve: Trileaflet; normal thickness, mildly calcified leaflets. - Mitral valve: Calcified annulus. There was mild regurgitation.   Cath 03-08-2015:    Conclusion    Sequential SVG-OM2-OM3 was injected is large, and is anatomically normal.  Y Graft SVG-LAD from SVG-OM is large, and is anatomically normal. The downstream LAD is small caliber and diffusely diseased.  LM lesion, 100% stenosed.  Ost LAD to Prox LAD lesion, 100% stenosed.  Ost 2nd Mrg to 2nd Mrg lesion, 100% stenosed.  SVG was injected is small. JR4 Catheter  Prox Original SVG-OM1 100% stenosed.  Ost Cx to Mid Cx lesion, 100% stenosed.  Borderline LOW LVEDP   Impression:  Known severe native coronary disease. Disabled contrast the native arteries were not evaluated.  Occluded old SVG-OM1 - the dye remained standing in the proximal vessel for several minutes following angiography - not PCI amenable ? This is the likely culprit for the patient's episode of what sounds like anginal symptoms about a month ago. This may also be a cause of now worsening ventricular tachycardia.   Widely patent Y graft with one limb providing sequential grafting to OM1 and OM 2 as well as a separate limb serving as a graft to the LAD which retrograde fills the diagonal branch. This redo graft now provides the entirety of the patient's coronary circulation.  The graft obtuse marginal branches are relatively free of disease with retrograde filling back to the distal circumflex system which consists of the posterolateral branch and left PDA.  The grafted LAD itself was a very diffusely diseased vessel, but no change from previous.  Known small nondominant right coronary artery system.  Borderline low LVEDP   Recommendations:  No PCI targets.  Standard post radial cath care with TR band removal  Would proceed with ICD placement per EP     ASSESSMENT &  PLAN:    1.  CAD - extensive and long standing - he has had remote CABG - he has been managed medically for many years. He is on high dose nitrates. Still doing ok for the most part.   2. HTN - little fluctuation but overall looks ok. No changes made and I have left him on his current regimen.   3. HLD - on statin - labs from March noted.   4. Chronic systolic HF - reduced EF - his medical therapy has been limited by bradycardia and known significant conduction disease - fortunately continues to do ok and looks stable.   5. Significant conduction disease/history of VT - no syncope reported. Remains on Mexiletine and Ranexa per prior EP recommendations.   6. CKD - lab today.    7. Advanced age - he does seem to be slowing down. Supportive care.    Current medicines are reviewed with the patient today.  The patient does not have concerns regarding medicines other than what has been noted above.  The following changes have been made:  See above.  Labs/ tests ordered today include:    Orders Placed This Encounter  Procedures  . Basic metabolic panel  . CBC     Disposition:   FU with me in 4 months.    Patient is agreeable to this plan and will call if any problems develop in the interim.   SignedNorma Fredrickson, NP  01/31/2020 3:36 PM  [facility] [address] [place], [place]  [address] Phone: [phone] Fax: [phone]        

## 2020-01-23 DIAGNOSIS — Z85828 Personal history of other malignant neoplasm of skin: Secondary | ICD-10-CM | POA: Diagnosis not present

## 2020-01-23 DIAGNOSIS — Z08 Encounter for follow-up examination after completed treatment for malignant neoplasm: Secondary | ICD-10-CM | POA: Diagnosis not present

## 2020-01-31 ENCOUNTER — Other Ambulatory Visit: Payer: Self-pay

## 2020-01-31 ENCOUNTER — Encounter: Payer: Self-pay | Admitting: Nurse Practitioner

## 2020-01-31 ENCOUNTER — Ambulatory Visit: Payer: Medicare PPO | Admitting: Nurse Practitioner

## 2020-01-31 VITALS — BP 160/80 | HR 79 | Ht 67.0 in | Wt 136.4 lb

## 2020-01-31 DIAGNOSIS — I472 Ventricular tachycardia, unspecified: Secondary | ICD-10-CM

## 2020-01-31 DIAGNOSIS — I255 Ischemic cardiomyopathy: Secondary | ICD-10-CM | POA: Diagnosis not present

## 2020-01-31 DIAGNOSIS — I10 Essential (primary) hypertension: Secondary | ICD-10-CM | POA: Diagnosis not present

## 2020-01-31 DIAGNOSIS — I5022 Chronic systolic (congestive) heart failure: Secondary | ICD-10-CM

## 2020-01-31 DIAGNOSIS — E7800 Pure hypercholesterolemia, unspecified: Secondary | ICD-10-CM

## 2020-01-31 DIAGNOSIS — I2510 Atherosclerotic heart disease of native coronary artery without angina pectoris: Secondary | ICD-10-CM

## 2020-01-31 MED ORDER — NITROGLYCERIN 0.4 MG SL SUBL: 0.4000 mg | SUBLINGUAL_TABLET | SUBLINGUAL | 3 refills | Status: AC | PRN

## 2020-01-31 NOTE — Patient Instructions (Addendum)
After Visit Summary:  We will be checking the following labs today - BMET and CBC   Medication Instructions:    Continue with your current medicines.    If you need a refill on your cardiac medications before your next appointment, please call your pharmacy.     Testing/Procedures To Be Arranged:  N/A  Follow-Up:   See me in 4 months.     At [facility], you and your health needs are our priority.  As part of our continuing mission to provide you with exceptional heart care, we have created designated Provider Care Teams.  These Care Teams include your primary Cardiologist (physician) and Advanced Practice Providers (APPs -  Physician Assistants and Nurse Practitioners) who all work together to provide you with the care you need, when you need it.  Special Instructions:  . Stay safe, wash your hands for at least 20 seconds and wear a mask when needed.  . It was good to talk with you today.    Call the [facility] office at [phone] if you have any questions, problems or concerns.       

## 2020-02-01 LAB — CBC
Hematocrit: 32.8 % — ABNORMAL LOW (ref 37.5–51.0)
Hemoglobin: 10.9 g/dL — ABNORMAL LOW (ref 13.0–17.7)
MCH: 34.7 pg — ABNORMAL HIGH (ref 26.6–33.0)
MCHC: 33.2 g/dL (ref 31.5–35.7)
MCV: 105 fL — ABNORMAL HIGH (ref 79–97)
Platelets: 148 10*3/uL — ABNORMAL LOW (ref 150–450)
RBC: 3.14 x10E6/uL — ABNORMAL LOW (ref 4.14–5.80)
RDW: 11.8 % (ref 11.6–15.4)
WBC: 4.9 10*3/uL (ref 3.4–10.8)

## 2020-02-01 LAB — BASIC METABOLIC PANEL
BUN/Creatinine Ratio: 22 (ref 10–24)
BUN: 30 mg/dL (ref 10–36)
CO2: 24 mmol/L (ref 20–29)
Calcium: 9 mg/dL (ref 8.6–10.2)
Chloride: 106 mmol/L (ref 96–106)
Creatinine, Ser: 1.39 mg/dL — ABNORMAL HIGH (ref 0.76–1.27)
GFR calc Af Amer: 51 mL/min/{1.73_m2} — ABNORMAL LOW (ref >59)
GFR calc non Af Amer: 44 mL/min/{1.73_m2} — ABNORMAL LOW (ref >59)
Glucose: 110 mg/dL — ABNORMAL HIGH (ref 65–99)
Potassium: 4.8 mmol/L (ref 3.5–5.2)
Sodium: 141 mmol/L (ref 134–144)

## 2020-02-13 DIAGNOSIS — Z85828 Personal history of other malignant neoplasm of skin: Secondary | ICD-10-CM | POA: Diagnosis not present

## 2020-02-13 DIAGNOSIS — Z08 Encounter for follow-up examination after completed treatment for malignant neoplasm: Secondary | ICD-10-CM | POA: Diagnosis not present

## 2020-02-29 ENCOUNTER — Other Ambulatory Visit: Payer: Self-pay | Admitting: Cardiovascular Disease

## 2020-04-07 ENCOUNTER — Other Ambulatory Visit: Payer: Self-pay

## 2020-04-07 ENCOUNTER — Emergency Department (HOSPITAL_COMMUNITY): Payer: Medicare PPO

## 2020-04-07 ENCOUNTER — Emergency Department (HOSPITAL_COMMUNITY)
Admission: EM | Admit: 2020-04-07 | Discharge: 2020-04-07 | Disposition: A | Discharge status: Home / Self Care | Payer: Medicare PPO | Attending: Emergency Medicine | Admitting: Emergency Medicine

## 2020-04-07 DIAGNOSIS — Z23 Encounter for immunization: Secondary | ICD-10-CM | POA: Diagnosis not present

## 2020-04-07 DIAGNOSIS — M4602 Spinal enthesopathy, cervical region: Secondary | ICD-10-CM | POA: Diagnosis not present

## 2020-04-07 DIAGNOSIS — Z79899 Other long term (current) drug therapy: Secondary | ICD-10-CM | POA: Insufficient documentation

## 2020-04-07 DIAGNOSIS — J449 Chronic obstructive pulmonary disease, unspecified: Secondary | ICD-10-CM | POA: Insufficient documentation

## 2020-04-07 DIAGNOSIS — G9389 Other specified disorders of brain: Secondary | ICD-10-CM | POA: Diagnosis not present

## 2020-04-07 DIAGNOSIS — M7989 Other specified soft tissue disorders: Secondary | ICD-10-CM | POA: Diagnosis not present

## 2020-04-07 DIAGNOSIS — W010XXA Fall on same level from slipping, tripping and stumbling without subsequent striking against object, initial encounter: Secondary | ICD-10-CM | POA: Diagnosis not present

## 2020-04-07 DIAGNOSIS — S0181XA Laceration without foreign body of other part of head, initial encounter: Secondary | ICD-10-CM | POA: Diagnosis not present

## 2020-04-07 DIAGNOSIS — M25461 Effusion, right knee: Secondary | ICD-10-CM | POA: Diagnosis not present

## 2020-04-07 DIAGNOSIS — I110 Hypertensive heart disease with heart failure: Secondary | ICD-10-CM | POA: Insufficient documentation

## 2020-04-07 DIAGNOSIS — S0101XA Laceration without foreign body of scalp, initial encounter: Secondary | ICD-10-CM | POA: Diagnosis not present

## 2020-04-07 DIAGNOSIS — W19XXXA Unspecified fall, initial encounter: Secondary | ICD-10-CM | POA: Diagnosis not present

## 2020-04-07 DIAGNOSIS — S8001XA Contusion of right knee, initial encounter: Secondary | ICD-10-CM | POA: Insufficient documentation

## 2020-04-07 DIAGNOSIS — I5022 Chronic systolic (congestive) heart failure: Secondary | ICD-10-CM | POA: Diagnosis not present

## 2020-04-07 DIAGNOSIS — S0990XA Unspecified injury of head, initial encounter: Secondary | ICD-10-CM

## 2020-04-07 DIAGNOSIS — I2510 Atherosclerotic heart disease of native coronary artery without angina pectoris: Secondary | ICD-10-CM | POA: Diagnosis not present

## 2020-04-07 DIAGNOSIS — M1711 Unilateral primary osteoarthritis, right knee: Secondary | ICD-10-CM | POA: Diagnosis not present

## 2020-04-07 DIAGNOSIS — S199XXA Unspecified injury of neck, initial encounter: Secondary | ICD-10-CM | POA: Diagnosis not present

## 2020-04-07 MED ORDER — TETANUS-DIPHTH-ACELL PERTUSSIS 5-2.5-18.5 LF-MCG/0.5 IM SUSY
0.5000 mL | PREFILLED_SYRINGE | Freq: Once | INTRAMUSCULAR | Status: AC
  Administered 2020-04-07: 0.5 mL via INTRAMUSCULAR
  Filled 2020-04-07 (×2): qty 0.5

## 2020-04-07 MED ORDER — LIDOCAINE-EPINEPHRINE 1 %-1:100000 IJ SOLN
10.0000 mL | Freq: Once | INTRAMUSCULAR | Status: AC
  Administered 2020-04-07: 10 mL
  Filled 2020-04-07: qty 1

## 2020-04-07 MED ORDER — LIDOCAINE-EPINEPHRINE-TETRACAINE (LET) TOPICAL GEL
3.0000 mL | Freq: Once | TOPICAL | Status: AC
  Administered 2020-04-07: 3 mL via TOPICAL
  Filled 2020-04-07: qty 3

## 2020-04-07 MED ORDER — ACETAMINOPHEN 500 MG PO TABS
1000.0000 mg | ORAL_TABLET | Freq: Once | ORAL | Status: AC
  Administered 2020-04-07: 1000 mg via ORAL
  Filled 2020-04-07: qty 2

## 2020-04-07 MED ORDER — LIDOCAINE-EPINEPHRINE-TETRACAINE (LET) SOLUTION
3.0000 mL | Freq: Once | NASAL | Status: DC
  Filled 2020-04-07: qty 3

## 2020-04-07 NOTE — ED Notes (Signed)
Pt discharged via wheelchair with family. All questions and concerns addressed. No complaints at this time.   

## 2020-04-07 NOTE — ED Provider Notes (Signed)
[facility] EMERGENCY DEPARTMENT Provider Note   CSN: [id number] Arrival date & time: 04/07/20  1538     History Chief Complaint  Patient presents with  . Fall    Karl Stevenson is a 86 y.o. male.  86 year old male with past medical history below including AAA, COPD, CHF, PVD, Crohn's disease, hypertension, hyperlipidemia who presents with fall.  Just prior to arrival, patient slipped because he was wearing new shoes and he fell forward, striking his forehead on the corner of a wall.  He did not lose consciousness and he recalls the entire event.  He struck his right knee and has had some right knee swelling and mild pain.  He denies any neck, back, chest, abdominal, or other extremity pain.  He is on Plavix.  Unknown last tetanus vaccination.  No vision changes or vomiting.  The history is provided by the patient.  Fall       No past medical history on file.  There are no problems to display for this patient.   *The histories are not reviewed yet. Please review them in the "History" navigator section and refresh this SmartLink.   PMH: AAA (abdominal aortic aneurysm) (HCC)  Date Unknown Bradycardia  Date Unknown Carotid artery occlusion  Date Unknown Chronic systolic heart failure (HCC)  Date Unknown COPD (chronic obstructive pulmonary disease) (HCC) Date Unknown Coronary artery disease  Date Unknown Crohn's disease (HCC)  Date Unknown Gout Date Unknown Hyperlipidemia Date Unknown Hypertension Date Unknown Ischemic cardiomyopathy  Date Unknown Peripheral vascular disease (HCC)  Date Unknown RBBB Date Unknown Renal artery stenosis (HCC)  Date Unknown Syncope Date Unknown Tachycardia  Date Unknown Ventricular tachycardia (HCC)   No family history on file.  Social History   Tobacco Use  . Smoking status: Not on file  Substance Use Topics  . Alcohol use: Not on file  . Drug use: Not on file    Home Medications Prior to  Admission medications   Medication Sig Start Date End Date Taking? Authorizing Provider  atorvastatin (LIPITOR) 10 MG tablet Take 10 mg by mouth daily. 12/18/19   [provider]  clopidogrel (PLAVIX) 75 MG tablet Take 75 mg by mouth daily. 02/29/20   [provider]  isosorbide mononitrate (IMDUR) 120 MG 24 hr tablet Take 120 mg by mouth daily. 03/04/20   [provider]  isosorbide mononitrate (IMDUR) 60 MG 24 hr tablet Take 60 mg by mouth daily. 03/04/20   [provider]  losartan (COZAAR) 25 MG tablet Take 25 mg by mouth 2 (two) times daily. 10/11/19   [provider]  mexiletine (MEXITIL) 200 MG capsule Take by mouth. 01/23/20   [provider]  nitroGLYCERIN (NITROSTAT) 0.4 MG SL tablet SMARTSIG:1 Sublingual Every Night 02/01/20   [provider]  pantoprazole (PROTONIX) 40 MG tablet Take 40 mg by mouth daily. 02/29/20   [provider]  ranolazine (RANEXA) 500 MG 12 hr tablet Take by mouth. 02/01/20   [provider]    Allergies    Procaine  Review of Systems   Review of Systems All other systems reviewed and are negative except that which was mentioned in HPI  Physical Exam Updated Vital Signs BP (!) 169/78   Pulse 72   Temp (!) 97.3 F (36.3 C) (Oral)   Resp 19   Ht 5\' 7"  (1.702 m)   Wt 62.1 kg   SpO2 100%   BMI 21.46 kg/m   Physical Exam Vitals and nursing  note reviewed.  Constitutional:      General: He is not in acute distress.    Appearance: He is well-developed.  HENT:     Head: Normocephalic.     Comments: 3cm linear laceration middle of forehead near hairline, mild bleeding; scabbed chronic skin lesion L parietal scalp Eyes:     Conjunctiva/sclera: Conjunctivae normal.     Pupils: Pupils are equal, round, and reactive to light.  Cardiovascular:     Rate and Rhythm: Normal rate and regular rhythm.     Heart sounds: Normal heart sounds. No murmur heard.   Pulmonary:     Effort:  Pulmonary effort is normal.     Breath sounds: Normal breath sounds.  Chest:     Chest wall: No tenderness.  Abdominal:     General: Bowel sounds are normal. There is no distension.     Palpations: Abdomen is soft.     Tenderness: There is no abdominal tenderness.  Musculoskeletal:        General: Swelling present.     Cervical back: Neck supple. No tenderness.     Comments: Significant edema R knee over patella w/ surrounding ecchymosis, normal ROM; normal ROM b/l knees and b/l UE without pain  Skin:    General: Skin is warm and dry.  Neurological:     Mental Status: He is alert and oriented to person, place, and time.     Cranial Nerves: No cranial nerve deficit.     Sensory: No sensory deficit.     Motor: No weakness.     Comments: Fluent speech  Psychiatric:        Judgment: Judgment normal.     ED Results / Procedures / Treatments   Labs (all labs ordered are listed, but only abnormal results are displayed) Labs Reviewed - No data to display  EKG None  Radiology CT Head Wo Contrast  Result Date: 04/07/2020 CLINICAL DATA:  Fall, striking head on a wall. EXAM: CT HEAD WITHOUT CONTRAST TECHNIQUE: Contiguous axial images were obtained from the base of the skull through the vertex without intravenous contrast. COMPARISON:  02/17/2016 FINDINGS: Brain: Mega cisterna magna. Remote lacunar infarcts in the left caudate nucleus, unchanged. Chronic prominence of low-density extra-axial fluid without complexity, likely attributable to cerebral atrophy. Otherwise, the brainstem, cerebellum, cerebral peduncles, thalamus, basal ganglia, basilar cisterns, and ventricular system appear within normal limits. No intracranial hemorrhage, mass lesion, or acute CVA. Vascular: There is atherosclerotic calcification of the cavernous carotid arteries bilaterally. Skull: Unremarkable Sinuses/Orbits: Chronic opacification of right mastoid air cells. Chronic fluid density in the right middle ear  favoring chronic otitis media. Chronic bilateral maxillary and ethmoid sinusitis. Other: Laceration along the midline of the frontal scalp with gas in the soft tissues extending almost down to the periosteum. There is also a suspected laceration along the left parietal scalp. IMPRESSION: 1. No acute intracranial findings. 2. Laceration along the midline of the frontal scalp with gas in the soft tissues extending almost down to the periosteum. There is also a suspected laceration along the left parietal scalp. 3. Chronic opacification of right mastoid air cells. Chronic fluid density in the right middle ear favoring chronic otitis media. Chronic bilateral maxillary and ethmoid sinusitis. 4. Chronic prominence of low-density extra-axial fluid without complexity, likely attributable to cerebral atrophy. 5. Remote lacunar infarcts in the left caudate nucleus. 6. Mega cisterna magna. Electronically Signed   By: Gaylyn Rong M.D.   On: 04/07/2020 18:10   CT Cervical Spine Wo  Contrast  Result Date: 04/07/2020 CLINICAL DATA:  Fall, head laceration EXAM: CT CERVICAL SPINE WITHOUT CONTRAST TECHNIQUE: Multidetector CT imaging of the cervical spine was performed without intravenous contrast. Multiplanar CT image reconstructions were also generated. COMPARISON:  Cervical spine CT from 02/17/2016 FINDINGS: Alignment: No vertebral subluxation is observed. Skull base and vertebrae: Pannus with calcification posterior to the odontoid. Prominent loss of articular space at the anterior C1-2 articulation with associated spurring compatible with degenerative arthropathy. Extensive multilevel intervertebral spurring in the cervical spine. Loss of disc height at all levels between C3 and C7 compatible with degenerative disc disease. No cervical spine fracture or acute bony findings. Soft tissues and spinal canal: Atherosclerotic calcification of the aortic branch vessels in the upper chest. Disc levels: Uncinate and facet  spurring cause right foraminal impingement at C3-4, C4-5, and C5-6; and left foraminal impingement at C3-4, C4-5, C5-6, and C6-7. There is also substantial intervertebral spurring at C3-4, C4-5, C5-6, and C6-7 potentially contributing to central narrowing of the thecal sac. This is particularly severe at C4-5 where there is some high-density disc material unchanged from prior. Upper chest: Biapical pleuroparenchymal scarring with associated calcifications. Other: No supplemental non-categorized findings. IMPRESSION: 1. No cervical spine fracture or acute subluxation is observed. 2. Extensive multilevel intervertebral spurring and degenerative disc disease causing multilevel impingement. 3. Pannus with calcification posterior to the odontoid. 4. Biapical pleuroparenchymal scarring with associated calcifications. 5. Aortic atherosclerosis. Aortic Atherosclerosis (ICD10-I70.0). Electronically Signed   By: Gaylyn Rong M.D.   On: 04/07/2020 18:22   DG Knee Complete 4 Views Right  Result Date: 04/07/2020 CLINICAL DATA:  Right knee swelling after fall. EXAM: RIGHT KNEE - COMPLETE 4+ VIEW COMPARISON:  None. FINDINGS: No evidence of fracture, dislocation, or joint effusion. Moderate 3 compartment osteoarthritic changes of the right knee. Marked prepatellar soft tissue swelling/hematoma. IMPRESSION: 1. No acute fracture or dislocation identified about the right knee. 2. Marked prepatellar soft tissue swelling/hematoma. 3. Moderate 3 compartment osteoarthritic changes of the right knee. Electronically Signed   By: Ted Mcalpine M.D.   On: 04/07/2020 16:34    Procedures .Marland KitchenLaceration Repair  Date/Time: 04/07/2020 8:17 PM Performed by: Laurence Spates, MD Authorized by: Laurence Spates, MD   Consent:    Consent obtained:  Verbal   Consent given by:  Patient   Risks discussed:  Infection, pain and poor cosmetic result   Alternatives discussed:  No treatment Anesthesia (see MAR for exact  dosages):    Anesthesia method:  Topical application and local infiltration   Topical anesthetic:  LET   Local anesthetic:  Lidocaine 1% WITH epi Laceration details:    Location:  Face   Face location:  Forehead   Length (cm):  3   Depth (mm):  2 Repair type:    Repair type:  Intermediate Pre-procedure details:    Preparation:  Patient was prepped and draped in usual sterile fashion Treatment:    Area cleansed with:  Betadine and saline   Amount of cleaning:  Standard   Irrigation solution:  Sterile saline   Irrigation method:  Pressure wash Subcutaneous repair:    Suture size:  4-0   Suture material:  Vicryl   Suture technique:  Simple interrupted   Number of sutures:  2 Skin repair:    Repair method:  Sutures   Suture size:  5-0   Suture material:  Fast-absorbing gut   Suture technique:  Simple interrupted   Number of sutures:  5 Approximation:    Approximation:  Close Post-procedure details:    Dressing:  Open (no dressing)   Patient tolerance of procedure:  Tolerated well, no immediate complications   (including critical care time)  Medications Ordered in ED Medications  acetaminophen (TYLENOL) tablet 1,000 mg (1,000 mg Oral Given 04/07/20 1628)  Tdap (BOOSTRIX) injection 0.5 mL (0.5 mLs Intramuscular Given 04/07/20 1630)  lidocaine-EPINEPHrine (XYLOCAINE W/EPI) 1 %-1:100000 (with pres) injection 10 mL (10 mLs Other Given by Other 04/07/20 1956)  lidocaine-EPINEPHrine-tetracaine (LET) topical gel (3 mLs Topical Given 04/07/20 1632)    ED Course  I have reviewed the triage vital signs and the nursing notes.  Pertinent labs & imaging results that were available during my care of the patient were reviewed by me and considered in my medical decision making (see chart for details).    MDM Rules/Calculators/A&P                          PT alert, neuro intact, NAD, forehead lac and likely hematoma within R pre-patellar bursa w/ full ROM knee. No other complaints.   Updated Tdap and obtain CT of head and C-spine which were negative for acute injury aside from known laceration.  X-ray of knee negative for fracture.  Placed in Ace wrap and discussed supportive measures including compression, ice, elevation, and orthopedics follow-up as needed.  Repaired laceration at bedside, see procedure note for details.  Discussed wound care with patient and family member and reviewed return precautions regarding signs of infection or signs of intracranial injury.  They voiced understanding.  Patient ambulatory prior to discharge. Final Clinical Impression(s) / ED Diagnoses Final diagnoses:  Laceration of forehead, initial encounter  Effusion of prepatellar bursa, right  Contusion of right knee, initial encounter  Injury of head, initial encounter    Rx / DC Orders ED Discharge Orders    None       Ansleigh Safer, Ambrose Finland, MD 04/07/20 2022

## 2020-04-07 NOTE — ED Notes (Signed)
Pt off floor to CT

## 2020-04-07 NOTE — ED Triage Notes (Signed)
Pt from home today for fall. Reports tripped on shoe, fell, and hit head on corner of wall. Lac noted to forehead--bleeding controlled. Pt takes plavix.  Alert oriented on RA  Pt complains of 3/10 head pain and 4/10 right knee pain. Abrasion noted to right knee.

## 2020-04-16 ENCOUNTER — Encounter: Payer: Self-pay | Admitting: Family Medicine

## 2020-04-16 ENCOUNTER — Ambulatory Visit: Payer: Medicare PPO | Admitting: Family Medicine

## 2020-04-16 DIAGNOSIS — M25561 Pain in right knee: Secondary | ICD-10-CM

## 2020-04-16 NOTE — Progress Notes (Signed)
I saw and examined the patient with Dr. Marga Hoots and agree with assessment and plan as outlined.    Right knee contusion s/p fall, steadily improving.  Exam reveals significant ecchymosis, trace effusion. Good ROM and strength, minimal tenderness.  X-Rays show DJD but no fracture.  Will treat with daytime ACE wrap.  Could inject if pain worsens.

## 2020-04-16 NOTE — Progress Notes (Signed)
Office Visit Note   Patient: Karl Stevenson           Date of Birth: 23-Apr-1927           MRN: [id number] Visit Date: 04/16/2020 Requested by: Jarome Matin, MD [address] [place],  [place] [address] PCP: Jarome Matin, MD  Subjective: Chief Complaint  Patient presents with  . Right Knee - Pain    Fell on 04/07/20 - went to ED. Injury to the right knee -- red and swollen. Pain in the anterior knee.  Redness has spread. Much bruising in posterior thigh. Trying to keep weight off leg - uses rolling walker or cane to ambulate.  . requests a check of the forehead laceration    HPI: 86yo M presenting to clinic with concerns about right knee bruising after a fall [id number]. Patient states that he tripped over a rug in his him and landed on his right knee, hitting his head against the drywall. He presented immediately to the ED, where knee Xrays were obtained that did not demonstrate any evidence of fracture. He says that his pain has been minimal with ambulation, though his knee is sore to the touch. He has noticed deep bruising over the back of his head, but denies pain in this area. He is on plavix. He has a history of arthritis in the knee, but denies any joint pain lie his previous arthritis flares. He says that, overall, he feels he is doing well as he recovers.               ROS:   All other systems were reviewed and are negative.  Objective: Vital Signs: There were no vitals taken for this visit.  Physical Exam:  General:  Alert and oriented, in no acute distress. Pulm:  Breathing unlabored. Psy:  Normal mood, congruent affect. Skin:  Significant bruising expanding across both anterior and posterior aspect of knee. Superficial abrasion on anterior aspect of patellar, with no significant surrounding erythema or drainage.    MSK:  Normal gait, with walker assistance.  Right knee with full ROM. Mild effusion appreciated, with is minimally tender to palpation. No obvious  defects within quad musculature, and extensor strength is excellent without pain.  No pain over medial or lateral joint line. No pain with compression of patella.   Imaging: No results found.  Assessment & Plan: 86yo M presenting to clinic to follow up on right knee pain due to a fall injury one week ago. Patient with significant bruising, likely worsened by plavix therapy, however no fracture and minimal pain today. Fantastic extensor strength, thus doubt quad rupture. Suspect he should continue to heal very well with conservative therapy alone. Placed in ace wrap for gentle compression, and encouraged to continue ice therapy as needed for pain. We also discussed low impact quad exercises he can perform- leg lifts, knee extensions, and he was encouraged to walk as long as this does not bring about pain. Anticipatory guidance given regarding appearance changes of bruising as it heals, and return precautions were discussed.      Procedures: No procedures performed  No notes on file     PMFS History: Patient Active Problem List   Diagnosis Date Noted  . Orthostatic hypotension 10/25/2015  . Dehydration 10/24/2015  . Nausea and vomiting 10/24/2015  . Diarrhea 10/24/2015  . HLD (hyperlipidemia) 08/15/2015  . Midsternal chest pain 07/28/2015  . Syncope 04/27/2015  . Ventricular tachycardia (HCC) 03/07/2015  . NSVT (nonsustained ventricular tachycardia) (  HCC) 03/05/2015  . Unstable angina pectoris (HCC) 02/12/2014  . Abdominal pain, unspecified site 02/14/2013    Class: Acute  . SBO (small bowel obstruction) (HCC) 02/10/2013  . Ankle edema 09/02/2012  . Chronic venous insufficiency 09/02/2012  . Unstable angina (HCC) 07/28/2012  . CKD (chronic kidney disease), stage III (HCC) 07/28/2012  . Atherosclerosis of native arteries of the extremities, unspecified 10/30/2011  . Tachycardia 10/18/2011  . Bradycardia 10/18/2011  . RBBB 10/17/2011  . Chest pain with moderate risk of acute  coronary syndrome 10/17/2011  . Sinus tachycardia 10/17/2011  . Aftercare following surgery of the circulatory system, NEC 09/11/2011  . Bilateral femoral artery stenosis (HCC) 09/11/2011  . Hypotension 08/27/2011  . Peripheral vascular disease, unspecified (HCC) 07/24/2011  . Ischemic cardiomyopathy 06/16/2011  . Chronic systolic heart failure (HCC)   . Occlusion and stenosis of carotid artery without mention of cerebral infarction 06/05/2011  . Pain, limb, left 06/05/2011  . Intermittent claudication (HCC) 06/05/2011  . Carotid stenosis 06/05/2011  . Atherosclerosis of renal artery (HCC) 01/06/2011  . Hx of CABG '87, '08 11/21/2010  . HTN (hypertension) 11/21/2010  . PVD (peripheral vascular disease) (HCC) 11/21/2010  . Crohn's disease (HCC)   . Anemia   . Anemia    Past Medical History:  Diagnosis Date  . AAA (abdominal aortic aneurysm) (HCC)    a. Ectatic abdominal aorta with fusiform dilitation 3.4x3.4 and moderate thrombus burden 12/2010  . Bradycardia    a. previous intolerance to beta blockers - low dose toprol started 10/2011 for tachycardia but had rash and was discontinued;  b. tolerating low dose propranolol.  . Carotid artery occlusion    a. s/p L CEA in 2010;  b. 06/2014 Carotid U/S: stable LICA CEA site w/o significant stenosis bilat.  . Chronic systolic heart failure (HCC)    a. 02/2015 Echo:  35-40% (echo 11/2010); c. 02/2015 Echo: EF 20-25%, diff HK, mid-apicalanteroseptal and apical AK, Gr 1 DD, mild MR.  [name] COPD (chronic obstructive pulmonary disease) (HCC)   . Coronary artery disease    a. 1969 s/p MI;  b. S/P CABG 1987;  c. S/P redo 2008;  d. 07/2012 Cath: stable->Med Rx, EF 40%; e. 02/2014 MV: anterior and posterolateral/apical infarct with mild peri-infarct ischemia->Med Rx; f. 02/2015 Cath: LM 100d, LAD 100ost, LCX 100ost, OM2 100, VG->OM3->OM4 nl, VG-> LAD nl (Y from VG->OM), VG->OM2 100p.  . Crohn's disease (HCC)    a. s/p colon resection  . Gout   .  Hyperlipidemia   . Hypertension   . Ischemic cardiomyopathy    a. EF 34% (Lexiscan 06/2011); b. 35-40% (echo 11/2010); c. 02/2015 Echo: EF 20-25%, diff HK, mid-apicalanteroseptal and apical AK, Gr 1 DD, mild MR.  . Peripheral vascular disease (HCC)    a. Carotid dz s/p L CEA, RAS, AAA, LE dz  . RBBB   . Renal artery stenosis (HCC)    a. Dopplers 12/2010 - 1-59% R ostial renal artery stenosis, 2 L renal arteries both widely patent  . Syncope   . Tachycardia    a. 10/2011 - atrial tach vs avnrt - BB started.  . Ventricular tachycardia (HCC)    a. 02/2015 ->amio 200 daily added-->later switched to mexilitene.    Family History  Problem Relation Age of Onset  . Heart disease Father   . Cancer Mother   . COPD Mother   . Deep vein thrombosis Brother   . COPD Brother   . CAD Brother   . Anesthesia  problems Neg Hx     Past Surgical History:  Procedure Laterality Date  . ABDOMINAL AORTAGRAM N/A 06/11/2011   Procedure: ABDOMINAL [name];  Surgeon: [name], MD;  Location: [facility] CATH LAB;  Service: Cardiovascular;  Laterality: N/A;  . CARDIAC CATHETERIZATION  07/28/2012   Native 3v CAD, continued patency of the SVG-OM1-LAD and SVG-OM2-left PDA. LVEF 40% with multiple WMAs  . CARDIAC CATHETERIZATION N/A 03/08/2015   Procedure: Left Heart Cath and Coronary Angiography;  Surgeon: [name], MD;  [facility] LAB;  Service: Cardiovascular;  Laterality: N/A;  . CAROTID ENDARTERECTOMY Left 2010  . CATARACT EXTRACTION, BILATERAL    . COLON RESECTION  2008  . COLON SURGERY    . CORONARY ANGIOPLASTY    . CORONARY ARTERY BYPASS GRAFT  10/31/85 & 08/19/06  . ENDARTERECTOMY  08/26/2011   Procedure: ENDARTERECTOMY ILIAC;  Surgeon: [name], MD;  Location: [facility] OR;  Service: Vascular;  Laterality: Right;  Right Femoral Artery Endarterectomy with vascu guard patch angioplasty & intraoperative arteriogram.  . EYE SURGERY    . FEMORAL ENDARTERECTOMY Left  06/2011   ileofemoral  endarterectomy with bovine patch angioplasty  . LEFT HEART CATHETERIZATION WITH CORONARY ANGIOGRAM N/A 07/28/2012   Procedure: LEFT HEART CATHETERIZATION WITH CORONARY ANGIOGRAM;  Surgeon: [name], MD;  Location: [facility] CATH LAB;  Service: Cardiovascular;  Laterality: N/A;  . PR VEIN BYPASS GRAFT,AORTO-FEM-POP  10/1985  . TONSILLECTOMY     as a child   Social History   Occupational History  . Not on file  Tobacco Use  . Smoking status: Never Smoker  . Smokeless tobacco: Never Used  Vaping Use  . Vaping Use: Never used  Substance and Sexual Activity  . Alcohol use: No  . Drug use: No  . Sexual activity: Not on file

## 2020-04-30 ENCOUNTER — Telehealth: Payer: Self-pay | Admitting: Cardiovascular Disease

## 2020-04-30 NOTE — Telephone Encounter (Signed)
Called patient. He would like to try the nerve health nervive. I ran an interaction report. Appears to be safe for patient to try. Advised patient of this.

## 2020-04-30 NOTE — Telephone Encounter (Signed)
    Pt c/o medication issue:  1. Name of Medication: nervive  2. How are you currently taking this medication (dosage and times per day)?   3. Are you having a reaction (difficulty breathing--STAT)?   4. What is your medication issue? Pt got this OTC meds for his nerve pain and would like to ask Dr. Excell Seltzer if its safe for him to use with his heart medications

## 2020-05-01 NOTE — Progress Notes (Signed)
CARDIOLOGY OFFICE NOTE  Date:  05/15/2020    [name] Date of Birth: May 21, 1926 Medical Record #[id number]  PCP:  Jarome Matin, MD  Cardiologist:  Kirt Boys    Chief Complaint  Patient presents with  . Follow-up    Seen for Dr. Excell Seltzer    History of Present Illness: Karl Stevenson is a 86 y.o. male who presents today for a follow up visit. Seen for Dr. Excell Seltzer.    He has a history of coronary artery disease with redo CABG in 2008.  He has chronic systolic heart failure with the severe ischemic cardiomyopathy with LVEF approximately 30 to 35%.  Multiple comorbid conditions include peripheral arterial disease, hypertension, carotid stenosis with history of left carotid endarterectomy, abdominal aortic aneurysm, and ventricular tachycardia.  He has been followed by Dr. Graciela Husbands and is managed with chronic mexiletine and Ranexa for the past several years.  Amiodarone was discontinued because of bradycardia.   Saw last saw Dr. Excell Seltzer in June of 2019 and was doing ok. He has stopped driving. I last saw him with his wife Olive back in September - general weakness. Balance is poor. BP and HR ok for the most part. They were both happy with how he is doing.   Comes in today. Here alone. Olive is in the waiting room. He is doing ok. He had a busy holiday - lots of family in. He had a fall during that time over Thanksgiving. He tells me that he fainted going across the room. He went to the ER. Got stitched up. CTs negative for acute findings. He had just gotten up from sitting. He told the ER doctor he did not pass out - that it was because of new shoes. It has not recurred. No lab or EKG done. His BP readings and HR from home on his "ticker tape" look fine.    Past Medical History:  Diagnosis Date  . AAA (abdominal aortic aneurysm) (HCC)    a. Ectatic abdominal aorta with fusiform dilitation 3.4x3.4 and moderate thrombus burden 12/2010  . Bradycardia    a. previous intolerance  to beta blockers - low dose toprol started 10/2011 for tachycardia but had rash and was discontinued;  b. tolerating low dose propranolol.  . Carotid artery occlusion    a. s/p L CEA in 2010;  b. 06/2014 Carotid U/S: stable LICA CEA site w/o significant stenosis bilat.  . Chronic systolic heart failure (HCC)    a. 02/2015 Echo:  35-40% (echo 11/2010); c. 02/2015 Echo: EF 20-25%, diff HK, mid-apicalanteroseptal and apical AK, Gr 1 DD, mild MR.  [name] COPD (chronic obstructive pulmonary disease) (HCC)   . Coronary artery disease    a. 1969 s/p MI;  b. S/P CABG 1987;  c. S/P redo 2008;  d. 07/2012 Cath: stable->Med Rx, EF 40%; e. 02/2014 MV: anterior and posterolateral/apical infarct with mild peri-infarct ischemia->Med Rx; f. 02/2015 Cath: LM 100d, LAD 100ost, LCX 100ost, OM2 100, VG->OM3->OM4 nl, VG-> LAD nl (Y from VG->OM), VG->OM2 100p.  . Crohn's disease (HCC)    a. s/p colon resection  . Gout   . Hyperlipidemia   . Hypertension   . Ischemic cardiomyopathy    a. EF 34% (Lexiscan 06/2011); b. 35-40% (echo 11/2010); c. 02/2015 Echo: EF 20-25%, diff HK, mid-apicalanteroseptal and apical AK, Gr 1 DD, mild MR.  . Peripheral vascular disease (HCC)    a. Carotid dz s/p L CEA, RAS, AAA, LE dz  . RBBB   .  Renal artery stenosis (HCC)    a. Dopplers 12/2010 - 1-59% R ostial renal artery stenosis, 2 L renal arteries both widely patent  . Syncope   . Tachycardia    a. 10/2011 - atrial tach vs avnrt - BB started.  . Ventricular tachycardia (HCC)    a. 02/2015 ->amio 200 daily added-->later switched to mexilitene.    Past Surgical History:  Procedure Laterality Date  . ABDOMINAL AORTAGRAM N/A 06/11/2011   Procedure: ABDOMINAL [name];  Surgeon: [name], MD;  Location: [facility] CATH LAB;  Service: Cardiovascular;  Laterality: N/A;  . CARDIAC CATHETERIZATION  07/28/2012   Native 3v CAD, continued patency of the SVG-OM1-LAD and SVG-OM2-left PDA. LVEF 40% with multiple WMAs  . CARDIAC CATHETERIZATION N/A  03/08/2015   Procedure: Left Heart Cath and Coronary Angiography;  Surgeon: [name], MD;  Location: [facility] INVASIVE CV LAB;  Service: Cardiovascular;  Laterality: N/A;  . CAROTID ENDARTERECTOMY Left 2010  . CATARACT EXTRACTION, BILATERAL    . COLON RESECTION  2008  . COLON SURGERY    . CORONARY ANGIOPLASTY    . CORONARY ARTERY BYPASS GRAFT  10/31/85 & 08/19/06  . ENDARTERECTOMY  08/26/2011   Procedure: ENDARTERECTOMY ILIAC;  Surgeon: [name], MD;  Location: [facility] OR;  Service: Vascular;  Laterality: Right;  Right Femoral Artery Endarterectomy with vascu guard patch angioplasty & intraoperative arteriogram.  . EYE SURGERY    . FEMORAL ENDARTERECTOMY Left  06/2011   ileofemoral endarterectomy with bovine patch angioplasty  . LEFT HEART CATHETERIZATION WITH CORONARY ANGIOGRAM N/A 07/28/2012   Procedure: LEFT HEART CATHETERIZATION WITH CORONARY ANGIOGRAM;  Surgeon: [name], MD;  Location: [facility] CATH LAB;  Service: Cardiovascular;  Laterality: N/A;  . PR VEIN BYPASS GRAFT,AORTO-FEM-POP  10/1985  . TONSILLECTOMY     as a child     Medications: Current Meds  Medication Sig  . acetaminophen (TYLENOL) 500 MG tablet Take 1,000 mg by mouth every 6 (six) hours as needed (pain).   [name] aspirin EC 81 MG tablet Take 81 mg by mouth daily.  [name] atorvastatin (LIPITOR) 10 MG tablet Take 10 mg by mouth daily.  . carboxymethylcellulose (REFRESH PLUS) 0.5 % SOLN Place 1 drop into both eyes 4 (four) times daily.  . clopidogrel (PLAVIX) 75 MG tablet TAKE 1 TABLET EVERY DAY  . cyclobenzaprine (FLEXERIL) 10 MG tablet   . ipratropium (ATROVENT) 0.03 % nasal spray Place 1 spray into both nostrils daily.  . isosorbide mononitrate (IMDUR) 120 MG 24 hr tablet Take 120 mg by mouth daily.  . isosorbide mononitrate (IMDUR) 60 MG 24 hr tablet Take 60 mg by mouth daily.  [name] losartan (COZAAR) 25 MG tablet TAKE 1 TABLET TWICE DAILY  . mexiletine (MEXITIL) 200 MG capsule Take 1 capsule (200 mg total) by mouth every 12  (twelve) hours.  . Multiple Vitamin (MULTIVITAMIN WITH MINERALS) TABS tablet Take 2 tablets by mouth daily.  . nitroGLYCERIN (NITROSTAT) 0.4 MG SL tablet Place 1 tablet (0.4 mg total) under the tongue every 5 (five) minutes as needed for chest pain.  . pantoprazole (PROTONIX) 40 MG tablet TAKE 1 TABLET EVERY DAY  . polyethylene glycol powder (GLYCOLAX/MIRALAX) powder Take 17 g by mouth at bedtime. Mix in 8 oz liquid and drink  . ranolazine (RANEXA) 500 MG 12 hr tablet TAKE 2 TABLETS TWICE DAILY  . vitamin B-12 (CYANOCOBALAMIN) 1000 MCG tablet Take 2,000 mcg by mouth daily.     Allergies: Allergies  Allergen Reactions  . Amoxicillin  diaharrea  . Novocain [Procaine Hcl] Other (See Comments)    Cold sweats and very bad headache.   . Procaine Other (See Comments)    Reaction not recalled by the patient  . Procaine Other (See Comments)    Sweating headache  . Metoprolol Hives, Itching and Rash    Social History: The patient  reports that he has never smoked. He has never used smokeless tobacco. He reports that he does not drink alcohol and does not use drugs.   Family History: The patient's family history includes CAD in his brother; COPD in his brother and mother; Cancer in his mother; Deep vein thrombosis in his brother; Heart disease in his father.   Review of Systems: Please see the history of present illness.   All other systems are reviewed and negative.   Physical Exam: VS:  BP 128/72   Pulse 85   Ht  (1.702 m)   Wt 136 lb (61.7 kg)   SpO2 99%   BMI 21.30 kg/m  .  BMI Body mass index is 21.3 kg/m.  Wt Readings from Last 3 Encounters:  05/15/20 136 lb (61.7 kg)  04/07/20 137 lb (62.1 kg)  01/31/20 136 lb 6.4 oz (61.9 kg)    General: Elderly. Alert and in no acute distress. Getting a little frail. Using a cane. He is hard of hearing.  Cardiac: Regular rate and rhythm. No murmurs, rubs, or gallops. No edema.  Respiratory:  Lungs are clear to auscultation  bilaterally with normal work of breathing.  GI: Soft and nontender.  MS: No deformity or atrophy. Gait and ROM intact.  Skin: Warm and dry. Color is normal.  Neuro:  Strength and sensation are intact and no gross focal deficits noted.  Psych: Alert, appropriate and with normal affect.   LABORATORY DATA:  EKG:  EKG is ordered today. Personally reviewed by me. This demonstrates sinus rhythm IVCD - reviewed with Dr. Johney Frame.    Lab Results  Component Value Date   WBC 4.9 01/31/2020   HGB 10.9 (L) 01/31/2020   HCT 32.8 (L) 01/31/2020   PLT 148 (L) 01/31/2020   GLUCOSE 110 (H) 01/31/2020   CHOL 116 01/25/2019   TRIG 259 (H) 01/25/2019   HDL 37 (L) 01/25/2019   LDLCALC 39 01/25/2019   ALT 13 01/25/2019   AST 20 01/25/2019   NA 141 01/31/2020   K 4.8 01/31/2020   CL 106 01/31/2020   CREATININE 1.39 (H) 01/31/2020   BUN 30 01/31/2020   CO2 24 01/31/2020   TSH 2.053 10/24/2015   INR 1.20 07/27/2015         BNP (last 3 results) No results for input(s): BNP in the last 8760 hours.  ProBNP (last 3 results) No results for input(s): PROBNP in the last 8760 hours.   Other Studies Reviewed Today:  Echo Study Conclusions from 11/2015   - Left ventricle: The cavity size was normal. Systolic function was   moderately to severely reduced. The estimated ejection fraction   was in the range of 30% to 35%. There is akinesis of the   apicalanterior, lateral, inferolateral, inferior, and apical   myocardium. There is akinesis of the midanteroseptal myocardium.   There was an increased relative contribution of atrial   contraction to ventricular filling. Doppler parameters are   consistent with abnormal left ventricular relaxation (grade 1   diastolic dysfunction). - Aortic valve: Trileaflet; normal thickness, mildly calcified   leaflets. - Mitral valve: Calcified annulus. There was  mild regurgitation.      Cath 03-08-2015:      Conclusion      Sequential SVG-OM2-OM3 was  injected is large, and is anatomically normal.  Y Graft SVG-LAD from SVG-OM is large, and is anatomically normal. The downstream LAD is small caliber and diffusely diseased.  LM lesion, 100% stenosed.  Ost LAD to Prox LAD lesion, 100% stenosed.  Ost 2nd Mrg to 2nd Mrg lesion, 100% stenosed.  SVG was injected is small. JR4 Catheter  Prox Original SVG-OM1 100% stenosed.  Ost Cx to Mid Cx lesion, 100% stenosed.  Borderline LOW LVEDP     Impression:  Known severe native coronary disease. Disabled contrast the native arteries were not evaluated.  Occluded old SVG-OM1 - the dye remained standing in the proximal vessel for several minutes following angiography - not PCI amenable ? This is the likely culprit for the patient's episode of what sounds like anginal symptoms about a month ago. This may also be a cause of now worsening ventricular tachycardia.    Widely patent Y graft with one limb providing sequential grafting to OM1 and OM 2 as well as a separate limb serving as a graft to the LAD which retrograde fills the diagonal branch.  This redo graft now provides the entirety of the patient's coronary circulation.  The graft obtuse marginal branches are relatively free of disease with retrograde filling back to the distal circumflex system which consists of the posterolateral branch and left PDA.  The grafted LAD itself was a very diffusely diseased vessel, but no change from previous.  Known small nondominant right coronary artery system.  Borderline low LVEDP     Recommendations:  No PCI targets.  Standard post radial cath care with TR band removal  Would proceed with ICD placement per EP        ASSESSMENT & PLAN:       1. ?fainting spell - sounds like vasovagal - but could certainly have been due to his known conduction disorder - discussed with Dr. Johney Frame - could consider event monitor if Mr. Heideman wishes - otherwise observe. Mr. Rosamond does not wish to have a  monitor at this time - he will let us know if this recurs and then will place monitor. His wife is updated as well.   2. Old infarcts noted on CT  3. CAD - extensive and long standing. Remote CABG - managed medically. On high dose nitrates. No chest pain noted.   4. HTN - BP looks ok.   5. HLD - on statin  6. Chronic systolic HF with reduced EF - medical therapy limited by bradycardia and known significant conduction disease.   7. Significant conduction disease/history of VT - no syncope reported. Remains on Mexiletine and Ranexa per prior EP recommendations.    6. CKD - lab today.     7. Advanced age - he does seem to be slowing down. Supportive care. Goal of safety. He is not really interested in testing/procedures.    Current medicines are reviewed with the patient today.  The patient does not have concerns regarding medicines other than what has been noted above.  The following changes have been made:  See above.  Labs/ tests ordered today include:    Orders Placed This Encounter  Procedures  . Basic metabolic panel  . CBC  . TSH  . EKG 12-Lead     Disposition:   FU with Dr. Excell Seltzer in 4 months. He and his wife are aware  that I am leaving next month.    Patient is agreeable to this plan and will call if any problems develop in the interim.   SignedNorma Fredrickson, NP  05/15/2020 3:29 PM  [facility] [address] [place], [place]  [address] Phone: [phone] Fax: [phone]

## 2020-05-15 ENCOUNTER — Encounter: Payer: Self-pay | Admitting: Nurse Practitioner

## 2020-05-15 ENCOUNTER — Ambulatory Visit: Payer: Medicare PPO | Admitting: Nurse Practitioner

## 2020-05-15 ENCOUNTER — Other Ambulatory Visit: Payer: Self-pay

## 2020-05-15 VITALS — BP 128/72 | HR 85 | Ht 67.0 in | Wt 136.0 lb

## 2020-05-15 DIAGNOSIS — E7800 Pure hypercholesterolemia, unspecified: Secondary | ICD-10-CM

## 2020-05-15 DIAGNOSIS — R55 Syncope and collapse: Secondary | ICD-10-CM | POA: Diagnosis not present

## 2020-05-15 DIAGNOSIS — I10 Essential (primary) hypertension: Secondary | ICD-10-CM

## 2020-05-15 DIAGNOSIS — I2510 Atherosclerotic heart disease of native coronary artery without angina pectoris: Secondary | ICD-10-CM | POA: Diagnosis not present

## 2020-05-15 DIAGNOSIS — I5022 Chronic systolic (congestive) heart failure: Secondary | ICD-10-CM | POA: Diagnosis not present

## 2020-05-15 DIAGNOSIS — I472 Ventricular tachycardia, unspecified: Secondary | ICD-10-CM

## 2020-05-15 DIAGNOSIS — I255 Ischemic cardiomyopathy: Secondary | ICD-10-CM

## 2020-05-15 NOTE — Patient Instructions (Addendum)
After Visit Summary:  We will be checking the following labs today - BMET, CBC and TSH   Medication Instructions:    Continue with your current medicines.    If you need a refill on your cardiac medications before your next appointment, please call your pharmacy.     Testing/Procedures To Be Arranged:  N/A  Follow-Up:  See Dr. Excell Seltzer in 4 months    At [facility], you and your health needs are our priority.  As part of our continuing mission to provide you with exceptional heart care, we have created designated Provider Care Teams.  These Care Teams include your primary Cardiologist (physician) and Advanced Practice Providers (APPs -  Physician Assistants and Nurse Practitioners) who all work together to provide you with the care you need, when you need it.  Special Instructions:  . Stay safe, wash your hands for at least 20 seconds and wear a mask when needed.  . It was good to talk with you today. . If you have another fainting spell - you need to let us know   Call the [facility] office at [phone] if you have any questions, problems or concerns.

## 2020-05-16 LAB — CBC
Hematocrit: 31.4 % — ABNORMAL LOW (ref 37.5–51.0)
Hemoglobin: 10.7 g/dL — ABNORMAL LOW (ref 13.0–17.7)
MCH: 36.4 pg — ABNORMAL HIGH (ref 26.6–33.0)
MCHC: 34.1 g/dL (ref 31.5–35.7)
MCV: 107 fL — ABNORMAL HIGH (ref 79–97)
Platelets: 160 10*3/uL (ref 150–450)
RBC: 2.94 x10E6/uL — ABNORMAL LOW (ref 4.14–5.80)
RDW: 11.7 % (ref 11.6–15.4)
WBC: 4.9 10*3/uL (ref 3.4–10.8)

## 2020-05-16 LAB — BASIC METABOLIC PANEL
BUN/Creatinine Ratio: 21 (ref 10–24)
BUN: 28 mg/dL (ref 10–36)
CO2: 22 mmol/L (ref 20–29)
Calcium: 9 mg/dL (ref 8.6–10.2)
Chloride: 106 mmol/L (ref 96–106)
Creatinine, Ser: 1.36 mg/dL — ABNORMAL HIGH (ref 0.76–1.27)
GFR calc Af Amer: 52 mL/min/{1.73_m2} — ABNORMAL LOW (ref >59)
GFR calc non Af Amer: 45 mL/min/{1.73_m2} — ABNORMAL LOW (ref >59)
Glucose: 115 mg/dL — ABNORMAL HIGH (ref 65–99)
Potassium: 4.9 mmol/L (ref 3.5–5.2)
Sodium: 142 mmol/L (ref 134–144)

## 2020-05-16 LAB — TSH: TSH: 4.36 u[IU]/mL (ref 0.450–4.500)

## 2020-07-06 ENCOUNTER — Other Ambulatory Visit: Payer: Self-pay | Admitting: Cardiovascular Disease

## 2020-07-11 DIAGNOSIS — H6123 Impacted cerumen, bilateral: Secondary | ICD-10-CM | POA: Diagnosis not present

## 2020-07-11 DIAGNOSIS — H7201 Central perforation of tympanic membrane, right ear: Secondary | ICD-10-CM | POA: Diagnosis not present

## 2020-07-11 DIAGNOSIS — Z974 Presence of external hearing-aid: Secondary | ICD-10-CM | POA: Diagnosis not present

## 2020-07-26 DIAGNOSIS — L03119 Cellulitis of unspecified part of limb: Secondary | ICD-10-CM | POA: Diagnosis not present

## 2020-07-26 DIAGNOSIS — L97518 Non-pressure chronic ulcer of other part of right foot with other specified severity: Secondary | ICD-10-CM | POA: Diagnosis not present

## 2020-07-26 DIAGNOSIS — L02619 Cutaneous abscess of unspecified foot: Secondary | ICD-10-CM | POA: Diagnosis not present

## 2020-07-26 DIAGNOSIS — I739 Peripheral vascular disease, unspecified: Secondary | ICD-10-CM | POA: Diagnosis not present

## 2020-07-30 DIAGNOSIS — E538 Deficiency of other specified B group vitamins: Secondary | ICD-10-CM | POA: Diagnosis not present

## 2020-07-30 DIAGNOSIS — Z125 Encounter for screening for malignant neoplasm of prostate: Secondary | ICD-10-CM | POA: Diagnosis not present

## 2020-07-30 DIAGNOSIS — M109 Gout, unspecified: Secondary | ICD-10-CM | POA: Diagnosis not present

## 2020-07-30 DIAGNOSIS — E785 Hyperlipidemia, unspecified: Secondary | ICD-10-CM | POA: Diagnosis not present

## 2020-08-06 DIAGNOSIS — Z0000 Encounter for general adult medical examination without abnormal findings: Secondary | ICD-10-CM | POA: Diagnosis not present

## 2020-08-06 DIAGNOSIS — I739 Peripheral vascular disease, unspecified: Secondary | ICD-10-CM | POA: Diagnosis not present

## 2020-08-06 DIAGNOSIS — J449 Chronic obstructive pulmonary disease, unspecified: Secondary | ICD-10-CM | POA: Diagnosis not present

## 2020-08-06 DIAGNOSIS — E785 Hyperlipidemia, unspecified: Secondary | ICD-10-CM | POA: Diagnosis not present

## 2020-08-06 DIAGNOSIS — R82998 Other abnormal findings in urine: Secondary | ICD-10-CM | POA: Diagnosis not present

## 2020-08-06 DIAGNOSIS — L97512 Non-pressure chronic ulcer of other part of right foot with fat layer exposed: Secondary | ICD-10-CM | POA: Diagnosis not present

## 2020-08-06 DIAGNOSIS — I2510 Atherosclerotic heart disease of native coronary artery without angina pectoris: Secondary | ICD-10-CM | POA: Diagnosis not present

## 2020-08-06 DIAGNOSIS — L989 Disorder of the skin and subcutaneous tissue, unspecified: Secondary | ICD-10-CM | POA: Diagnosis not present

## 2020-08-06 DIAGNOSIS — I10 Essential (primary) hypertension: Secondary | ICD-10-CM | POA: Diagnosis not present

## 2020-08-14 DIAGNOSIS — I739 Peripheral vascular disease, unspecified: Secondary | ICD-10-CM | POA: Diagnosis not present

## 2020-08-14 DIAGNOSIS — L02611 Cutaneous abscess of right foot: Secondary | ICD-10-CM | POA: Diagnosis not present

## 2020-08-14 DIAGNOSIS — C4440 Unspecified malignant neoplasm of skin of scalp and neck: Secondary | ICD-10-CM | POA: Diagnosis not present

## 2020-08-14 DIAGNOSIS — L97518 Non-pressure chronic ulcer of other part of right foot with other specified severity: Secondary | ICD-10-CM | POA: Diagnosis not present

## 2020-08-19 ENCOUNTER — Other Ambulatory Visit: Payer: Self-pay

## 2020-08-19 DIAGNOSIS — I739 Peripheral vascular disease, unspecified: Secondary | ICD-10-CM

## 2020-08-19 DIAGNOSIS — I6529 Occlusion and stenosis of unspecified carotid artery: Secondary | ICD-10-CM

## 2020-08-20 ENCOUNTER — Ambulatory Visit (HOSPITAL_COMMUNITY)
Admission: RE | Admit: 2020-08-20 | Discharge: 2020-08-20 | Disposition: A | Discharge status: Home / Self Care | Payer: Medicare PPO | Source: Ambulatory Visit | Attending: Vascular Surgery | Admitting: Vascular Surgery

## 2020-08-20 ENCOUNTER — Ambulatory Visit (INDEPENDENT_AMBULATORY_CARE_PROVIDER_SITE_OTHER)
Admission: RE | Admit: 2020-08-20 | Discharge: 2020-08-20 | Disposition: A | Discharge status: Home / Self Care | Payer: Medicare PPO | Source: Ambulatory Visit | Attending: Vascular Surgery | Admitting: Vascular Surgery

## 2020-08-20 ENCOUNTER — Other Ambulatory Visit: Payer: Self-pay

## 2020-08-20 ENCOUNTER — Encounter: Payer: Self-pay | Admitting: Vascular Surgery

## 2020-08-20 ENCOUNTER — Ambulatory Visit: Payer: Medicare PPO | Admitting: Vascular Surgery

## 2020-08-20 VITALS — BP 147/77 | HR 77 | Temp 97.2°F | Resp 16 | Ht 63.0 in | Wt 133.0 lb

## 2020-08-20 DIAGNOSIS — I6529 Occlusion and stenosis of unspecified carotid artery: Secondary | ICD-10-CM

## 2020-08-20 DIAGNOSIS — I739 Peripheral vascular disease, unspecified: Secondary | ICD-10-CM | POA: Insufficient documentation

## 2020-08-20 NOTE — Progress Notes (Signed)
Patient name: Karl Stevenson MRN: [id number] DOB: 1926-07-04 Sex: male  REASON FOR CONSULT: Nonhealing right foot wound  HPI: Karl Stevenson is a 87 y.o. male, with history of ischemic cardiomyopathy, coronary artery disease, COPD, HTN, HLD, PVD that presents for evaluation of nonhealing right foot wound.  Patient states he had a wound on his right foot for about 1 month that has been not healing.  He is well-known to our practice and had a left femoral endarterectomy in 2013 and then a right femoral endarterectomy in 2013 as well by Dr. Imogene Burn.  He denies any severe rest pain at night.  He is ambulatory with a cane.  He has been keeping a Band-Aid on the wound.  Past Medical History:  Diagnosis Date  . AAA (abdominal aortic aneurysm) (HCC)    a. Ectatic abdominal aorta with fusiform dilitation 3.4x3.4 and moderate thrombus burden 12/2010  . Bradycardia    a. previous intolerance to beta blockers - low dose toprol started 10/2011 for tachycardia but had rash and was discontinued;  b. tolerating low dose propranolol.  . Carotid artery occlusion    a. s/p L CEA in 2010;  b. 06/2014 Carotid U/S: stable LICA CEA site w/o significant stenosis bilat.  . Chronic systolic heart failure (HCC)    a. 02/2015 Echo:  35-40% (echo 11/2010); c. 02/2015 Echo: EF 20-25%, diff HK, mid-apicalanteroseptal and apical AK, Gr 1 DD, mild MR.  [name] COPD (chronic obstructive pulmonary disease) (HCC)   . Coronary artery disease    a. 1969 s/p MI;  b. S/P CABG 1987;  c. S/P redo 2008;  d. 07/2012 Cath: stable->Med Rx, EF 40%; e. 02/2014 MV: anterior and posterolateral/apical infarct with mild peri-infarct ischemia->Med Rx; f. 02/2015 Cath: LM 100d, LAD 100ost, LCX 100ost, OM2 100, VG->OM3->OM4 nl, VG-> LAD nl (Y from VG->OM), VG->OM2 100p.  . Crohn's disease (HCC)    a. s/p colon resection  . Gout   . Hyperlipidemia   . Hypertension   . Ischemic cardiomyopathy    a. EF 34% (Lexiscan 06/2011); b. 35-40% (echo 11/2010); c.  02/2015 Echo: EF 20-25%, diff HK, mid-apicalanteroseptal and apical AK, Gr 1 DD, mild MR.  . Peripheral vascular disease (HCC)    a. Carotid dz s/p L CEA, RAS, AAA, LE dz  . RBBB   . Renal artery stenosis (HCC)    a. Dopplers 12/2010 - 1-59% R ostial renal artery stenosis, 2 L renal arteries both widely patent  . Syncope   . Tachycardia    a. 10/2011 - atrial tach vs avnrt - BB started.  . Ventricular tachycardia (HCC)    a. 02/2015 ->amio 200 daily added-->later switched to mexilitene.    Past Surgical History:  Procedure Laterality Date  . ABDOMINAL AORTAGRAM N/A 06/11/2011   Procedure: ABDOMINAL [name];  Surgeon: [name], MD;  Location: [facility] CATH LAB;  Service: Cardiovascular;  Laterality: N/A;  . CARDIAC CATHETERIZATION  07/28/2012   Native 3v CAD, continued patency of the SVG-OM1-LAD and SVG-OM2-left PDA. LVEF 40% with multiple WMAs  . CARDIAC CATHETERIZATION N/A 03/08/2015   Procedure: Left Heart Cath and Coronary Angiography;  Surgeon: [name], MD;  Location: [facility] INVASIVE CV LAB;  Service: Cardiovascular;  Laterality: N/A;  . CAROTID ENDARTERECTOMY Left 2010  . CATARACT EXTRACTION, BILATERAL    . COLON RESECTION  2008  . COLON SURGERY    . CORONARY ANGIOPLASTY    . CORONARY ARTERY BYPASS GRAFT  10/31/85 & 08/19/06  .  ENDARTERECTOMY  08/26/2011   Procedure: ENDARTERECTOMY ILIAC;  Surgeon: [name], MD;  Location: [facility] OR;  Service: Vascular;  Laterality: Right;  Right Femoral Artery Endarterectomy with vascu guard patch angioplasty & intraoperative arteriogram.  . EYE SURGERY    . FEMORAL ENDARTERECTOMY Left  06/2011   ileofemoral endarterectomy with bovine patch angioplasty  . LEFT HEART CATHETERIZATION WITH CORONARY ANGIOGRAM N/A 07/28/2012   Procedure: LEFT HEART CATHETERIZATION WITH CORONARY ANGIOGRAM;  Surgeon: Karl Bollman, MD;  Location: [facility] CATH LAB;  Service: Cardiovascular;  Laterality: N/A;  . PR VEIN BYPASS GRAFT,AORTO-FEM-POP  10/1985  . TONSILLECTOMY      as a child    Family History  Problem Relation Age of Onset  . Heart disease Father   . Cancer Mother   . COPD Mother   . Deep vein thrombosis Brother   . COPD Brother   . CAD Brother   . Anesthesia problems Neg Hx     SOCIAL HISTORY: Social History   Socioeconomic History  . Marital status: Married    Spouse name: Not on file  . Number of children: Not on file  . Years of education: Not on file  . Highest education level: Not on file  Occupational History  . Not on file  Tobacco Use  . Smoking status: Never Smoker  . Smokeless tobacco: Never Used  Vaping Use  . Vaping Use: Never used  Substance and Sexual Activity  . Alcohol use: No  . Drug use: No  . Sexual activity: Not on file  Other Topics Concern  . Not on file  Social History Narrative  . Not on file   Social Determinants of Health   Financial Resource Strain: Not on file  Food Insecurity: Not on file  Transportation Needs: Not on file  Physical Activity: Not on file  Stress: Not on file  Social Connections: Not on file  Intimate Partner Violence: Not on file    Allergies  Allergen Reactions  . Amoxicillin     diaharrea  . Novocain [Procaine Hcl] Other (See Comments)    Cold sweats and very bad headache.   . Procaine Other (See Comments)    Reaction not recalled by the patient  . Procaine Other (See Comments)    Sweating headache  . Metoprolol Hives, Itching and Rash    Current Outpatient Medications  Medication Sig Dispense Refill  . acetaminophen (TYLENOL) 500 MG tablet Take 1,000 mg by mouth every 6 (six) hours as needed (pain).     [name] aspirin EC 81 MG tablet Take 81 mg by mouth daily.    [name] atorvastatin (LIPITOR) 10 MG tablet Take 10 mg by mouth daily.    . carboxymethylcellulose (REFRESH PLUS) 0.5 % SOLN Place 1 drop into both eyes 4 (four) times daily.    . clopidogrel (PLAVIX) 75 MG tablet TAKE 1 TABLET EVERY DAY 90 tablet 3  . cyclobenzaprine (FLEXERIL) 10 MG tablet     . ipratropium  (ATROVENT) 0.03 % nasal spray Place 1 spray into both nostrils daily.    . isosorbide mononitrate (IMDUR) 120 MG 24 hr tablet Take 120 mg by mouth daily.    . isosorbide mononitrate (IMDUR) 60 MG 24 hr tablet Take 60 mg by mouth daily.    [name] losartan (COZAAR) 25 MG tablet TAKE 1 TABLET TWICE DAILY 180 tablet 2  . mexiletine (MEXITIL) 200 MG capsule Take 1 capsule (200 mg total) by mouth every 12 (twelve) hours. 180 capsule 2  .  Multiple Vitamin (MULTIVITAMIN WITH MINERALS) TABS tablet Take 2 tablets by mouth daily.    . nitroGLYCERIN (NITROSTAT) 0.4 MG SL tablet Place 1 tablet (0.4 mg total) under the tongue every 5 (five) minutes as needed for chest pain. 25 tablet 3  . pantoprazole (PROTONIX) 40 MG tablet TAKE 1 TABLET EVERY DAY 90 tablet 3  . polyethylene glycol powder (GLYCOLAX/MIRALAX) powder Take 17 g by mouth at bedtime. Mix in 8 oz liquid and drink    . ranolazine (RANEXA) 500 MG 12 hr tablet TAKE 2 TABLETS TWICE DAILY 360 tablet 2  . vitamin B-12 (CYANOCOBALAMIN) 1000 MCG tablet Take 2,000 mcg by mouth daily.     No current facility-administered medications for this visit.    REVIEW OF SYSTEMS:  [X]  denotes positive finding, [ ]  denotes negative finding Cardiac  Comments:  Chest pain or chest pressure:    Shortness of breath upon exertion:    Short of breath when lying flat:    Irregular heart rhythm:        Vascular    Pain in calf, thigh, or hip brought on by ambulation:    Pain in feet at night that wakes you up from your sleep:     Blood clot in your veins:    Leg swelling:         Pulmonary    Oxygen at home:    Productive cough:     Wheezing:         Neurologic    Sudden weakness in arms or legs:     Sudden numbness in arms or legs:     Sudden onset of difficulty speaking or slurred speech:    Temporary loss of vision in one eye:     Problems with dizziness:         Gastrointestinal    Blood in stool:     Vomited blood:         Genitourinary    Burning when  urinating:     Blood in urine:        Psychiatric    Major depression:         Hematologic    Bleeding problems:    Problems with blood clotting too easily:        Skin    Rashes or ulcers:        Constitutional    Fever or chills:      PHYSICAL EXAM: Vitals:   08/20/20 1018  BP: (!) 147/77  Pulse: 77  Resp: 16  Temp: (!) 97.2 F (36.2 C)  TempSrc: Temporal  SpO2: 98%  Weight: 133 lb (60.3 kg)  Height: 5\' 3"  (1.6 m)    GENERAL: The patient is a well-nourished male, in no acute distress. The vital signs are documented above. CARDIAC: There is a regular rate and rhythm.  VASCULAR:  Palpable femoral pulses bilaterally Bilateral groin incisions from previous femoral endarterectomy No palpable pedal pulses Wound on right metatarsal head as pictured below PULMONARY: No respiratory distress. ABDOMEN: Soft and non-tender. MUSCULOSKELETAL: There are no major deformities or cyanosis. NEUROLOGIC: No focal weakness or paresthesias are detected. PSYCHIATRIC: The patient has a normal affect.      DATA:   ABI 0.95 on the right monophasic and 0.8 on the left monophasic  Carotid duplex shows minimal disease only 1-39% stenosis bilaterally  Assessment/Plan:  87 year old male who presents with nonhealing wound on the right metatarsal head as pictured above that has been present for 1 month.  He has ABI of 0.95 but I believe this is falsely elevated given abnormal waveform at the ankle that is monophasic.  He does have palpable femoral pulses but no palpable pedal pulses.  I recommended aortogram with right lower extremity arteriogram and possible intervention for CLI with tissue loss.  I recommended that we could do this as early as Thursday but they unfortunately have other appointments scheduled and would like to delay.  I am out of town next week and will do him the following week in the cath lab.  Risk benefits discussed   [name], MD [facility] [place] Office: [phone]

## 2020-08-21 ENCOUNTER — Telehealth: Payer: Self-pay

## 2020-08-21 ENCOUNTER — Ambulatory Visit: Payer: Medicare PPO | Admitting: Cardiovascular Disease

## 2020-08-21 DIAGNOSIS — D485 Neoplasm of uncertain behavior of skin: Secondary | ICD-10-CM | POA: Diagnosis not present

## 2020-08-21 DIAGNOSIS — L98498 Non-pressure chronic ulcer of skin of other sites with other specified severity: Secondary | ICD-10-CM | POA: Diagnosis not present

## 2020-08-21 NOTE — Telephone Encounter (Signed)
Spoke with pt/wife to update on arrival time for procedure on 09/05/20 to 0800 at [facility]. Both pt/wife verbalized understanding.

## 2020-09-02 ENCOUNTER — Other Ambulatory Visit: Payer: Self-pay | Admitting: Cardiovascular Disease

## 2020-09-03 ENCOUNTER — Other Ambulatory Visit (HOSPITAL_COMMUNITY)
Admission: RE | Admit: 2020-09-03 | Discharge: 2020-09-03 | Disposition: A | Discharge status: Home / Self Care | Payer: Medicare PPO | Source: Ambulatory Visit | Attending: Vascular Surgery | Admitting: Vascular Surgery

## 2020-09-03 DIAGNOSIS — Z20822 Contact with and (suspected) exposure to covid-19: Secondary | ICD-10-CM | POA: Insufficient documentation

## 2020-09-03 DIAGNOSIS — Z01812 Encounter for preprocedural laboratory examination: Secondary | ICD-10-CM | POA: Insufficient documentation

## 2020-09-04 LAB — SARS CORONAVIRUS 2 (TAT 6-24 HRS): SARS Coronavirus 2: NEGATIVE

## 2020-09-05 ENCOUNTER — Ambulatory Visit (HOSPITAL_COMMUNITY)
Admission: RE | Admit: 2020-09-05 | Discharge: 2020-09-05 | Disposition: A | Discharge status: Home / Self Care | Payer: Medicare PPO | Source: Ambulatory Visit | Attending: Vascular Surgery | Admitting: Vascular Surgery

## 2020-09-05 ENCOUNTER — Other Ambulatory Visit: Payer: Self-pay

## 2020-09-05 ENCOUNTER — Encounter (HOSPITAL_COMMUNITY)
Admission: RE | Disposition: A | Discharge status: Home / Self Care | Payer: Self-pay | Source: Ambulatory Visit | Attending: Vascular Surgery

## 2020-09-05 DIAGNOSIS — I5022 Chronic systolic (congestive) heart failure: Secondary | ICD-10-CM | POA: Insufficient documentation

## 2020-09-05 DIAGNOSIS — Z888 Allergy status to other drugs, medicaments and biological substances status: Secondary | ICD-10-CM | POA: Diagnosis not present

## 2020-09-05 DIAGNOSIS — I255 Ischemic cardiomyopathy: Secondary | ICD-10-CM | POA: Diagnosis not present

## 2020-09-05 DIAGNOSIS — J449 Chronic obstructive pulmonary disease, unspecified: Secondary | ICD-10-CM | POA: Diagnosis not present

## 2020-09-05 DIAGNOSIS — I70235 Atherosclerosis of native arteries of right leg with ulceration of other part of foot: Secondary | ICD-10-CM | POA: Diagnosis not present

## 2020-09-05 DIAGNOSIS — Z79899 Other long term (current) drug therapy: Secondary | ICD-10-CM | POA: Insufficient documentation

## 2020-09-05 DIAGNOSIS — I2510 Atherosclerotic heart disease of native coronary artery without angina pectoris: Secondary | ICD-10-CM | POA: Diagnosis not present

## 2020-09-05 DIAGNOSIS — Z880 Allergy status to penicillin: Secondary | ICD-10-CM | POA: Insufficient documentation

## 2020-09-05 DIAGNOSIS — Z7982 Long term (current) use of aspirin: Secondary | ICD-10-CM | POA: Diagnosis not present

## 2020-09-05 DIAGNOSIS — E785 Hyperlipidemia, unspecified: Secondary | ICD-10-CM | POA: Insufficient documentation

## 2020-09-05 DIAGNOSIS — I998 Other disorder of circulatory system: Secondary | ICD-10-CM | POA: Diagnosis not present

## 2020-09-05 DIAGNOSIS — I110 Hypertensive heart disease with heart failure: Secondary | ICD-10-CM | POA: Insufficient documentation

## 2020-09-05 HISTORY — PX: PERIPHERAL VASCULAR BALLOON ANGIOPLASTY: CATH118281

## 2020-09-05 HISTORY — PX: ABDOMINAL AORTOGRAM W/LOWER EXTREMITY: CATH118223

## 2020-09-05 LAB — POCT I-STAT, CHEM 8
BUN: 28 mg/dL — ABNORMAL HIGH (ref 8–23)
Calcium, Ion: 1.22 mmol/L (ref 1.15–1.40)
Chloride: 106 mmol/L (ref 98–111)
Creatinine, Ser: 1.4 mg/dL — ABNORMAL HIGH (ref 0.61–1.24)
Glucose, Bld: 97 mg/dL (ref 70–99)
HCT: 30 % — ABNORMAL LOW (ref 39.0–52.0)
Hemoglobin: 10.2 g/dL — ABNORMAL LOW (ref 13.0–17.0)
Potassium: 4.3 mmol/L (ref 3.5–5.1)
Sodium: 142 mmol/L (ref 135–145)
TCO2: 24 mmol/L (ref 22–32)

## 2020-09-05 SURGERY — ABDOMINAL AORTOGRAM W/LOWER EXTREMITY
Anesthesia: LOCAL | Laterality: Right

## 2020-09-05 MED ORDER — HEPARIN (PORCINE) IN NACL 1000-0.9 UT/500ML-% IV SOLN
INTRAVENOUS | Status: AC
  Filled 2020-09-05: qty 500

## 2020-09-05 MED ORDER — CLOPIDOGREL BISULFATE 300 MG PO TABS
ORAL_TABLET | ORAL | Status: DC | PRN
  Administered 2020-09-05: 300 mg via ORAL

## 2020-09-05 MED ORDER — FENTANYL CITRATE (PF) 100 MCG/2ML IJ SOLN
INTRAMUSCULAR | Status: AC
  Filled 2020-09-05: qty 2

## 2020-09-05 MED ORDER — HEPARIN (PORCINE) IN NACL 1000-0.9 UT/500ML-% IV SOLN
INTRAVENOUS | Status: DC | PRN
  Administered 2020-09-05 (×2): 500 mL

## 2020-09-05 MED ORDER — HEPARIN SODIUM (PORCINE) 1000 UNIT/ML IJ SOLN
INTRAMUSCULAR | Status: DC | PRN
  Administered 2020-09-05: 6000 [IU] via INTRAVENOUS

## 2020-09-05 MED ORDER — ACETAMINOPHEN 325 MG PO TABS: 650.0000 mg | ORAL_TABLET | ORAL | Status: DC | PRN

## 2020-09-05 MED ORDER — HEPARIN SODIUM (PORCINE) 1000 UNIT/ML IJ SOLN
INTRAMUSCULAR | Status: AC
  Filled 2020-09-05: qty 1

## 2020-09-05 MED ORDER — MIDAZOLAM HCL 2 MG/2ML IJ SOLN
INTRAMUSCULAR | Status: DC | PRN
  Administered 2020-09-05: 1 mg via INTRAVENOUS

## 2020-09-05 MED ORDER — IODIXANOL 320 MG/ML IV SOLN
INTRAVENOUS | Status: DC | PRN
  Administered 2020-09-05: 90 mL via INTRA_ARTERIAL

## 2020-09-05 MED ORDER — FENTANYL CITRATE (PF) 100 MCG/2ML IJ SOLN
INTRAMUSCULAR | Status: DC | PRN
  Administered 2020-09-05: 25 ug via INTRAVENOUS

## 2020-09-05 MED ORDER — ONDANSETRON HCL 4 MG/2ML IJ SOLN: 4.0000 mg | Freq: Four times a day (QID) | INTRAMUSCULAR | Status: DC | PRN

## 2020-09-05 MED ORDER — SODIUM CHLORIDE 0.9 % IV SOLN: 250.0000 mL | INTRAVENOUS | Status: DC | PRN

## 2020-09-05 MED ORDER — LIDOCAINE HCL (PF) 1 % IJ SOLN
INTRAMUSCULAR | Status: DC | PRN
  Administered 2020-09-05: 15 mL via INTRADERMAL

## 2020-09-05 MED ORDER — MIDAZOLAM HCL 2 MG/2ML IJ SOLN
INTRAMUSCULAR | Status: AC
  Filled 2020-09-05: qty 2

## 2020-09-05 MED ORDER — HYDRALAZINE HCL 20 MG/ML IJ SOLN: 5.0000 mg | INTRAMUSCULAR | Status: DC | PRN

## 2020-09-05 MED ORDER — SODIUM CHLORIDE 0.9% FLUSH: 3.0000 mL | Freq: Two times a day (BID) | INTRAVENOUS | Status: DC

## 2020-09-05 MED ORDER — SODIUM CHLORIDE 0.9 % IV SOLN: INTRAVENOUS | Status: DC

## 2020-09-05 MED ORDER — SODIUM CHLORIDE 0.9% FLUSH
3.0000 mL | INTRAVENOUS | Status: DC | PRN
Start: 2020-09-05 — End: 2020-09-05

## 2020-09-05 MED ORDER — LIDOCAINE HCL (PF) 1 % IJ SOLN
INTRAMUSCULAR | Status: AC
  Filled 2020-09-05: qty 30

## 2020-09-05 MED ORDER — CLOPIDOGREL BISULFATE 75 MG PO TABS
ORAL_TABLET | ORAL | Status: AC
  Filled 2020-09-05: qty 1

## 2020-09-05 SURGICAL SUPPLY — 18 items
BAG SNAP BAND KOVER 36X36 (MISCELLANEOUS) ×1 IMPLANT
BALLN STERLING OTW 3X220X150 (BALLOONS) ×3
BALLOON STERLING OTW 3X220X150 (BALLOONS) IMPLANT
CATH CXI SUPP 2.6F 150 ST (CATHETERS) ×1 IMPLANT
CATH OMNI FLUSH 5F 65CM (CATHETERS) ×1 IMPLANT
COVER DOME SNAP 22 D (MISCELLANEOUS) ×1 IMPLANT
DEVICE CLOSURE MYNXGRIP 5F (Vascular Products) ×1 IMPLANT
KIT ENCORE 26 ADVANTAGE (KITS) ×1 IMPLANT
KIT MICROPUNCTURE NIT STIFF (SHEATH) ×1 IMPLANT
KIT PV (KITS) ×3 IMPLANT
SHEATH FLEX ANSEL ANG 5F 45CM (SHEATH) ×1 IMPLANT
SHEATH PINNACLE 5F 10CM (SHEATH) ×1 IMPLANT
SHEATH PROBE COVER 6X72 (BAG) ×1 IMPLANT
SYR MEDRAD MARK V 150ML (SYRINGE) ×1 IMPLANT
TRANSDUCER W/STOPCOCK (MISCELLANEOUS) ×3 IMPLANT
TRAY PV CATH (CUSTOM PROCEDURE TRAY) ×3 IMPLANT
WIRE G V18X300CM (WIRE) ×1 IMPLANT
WIRE HITORQ VERSACORE ST 145CM (WIRE) ×1 IMPLANT

## 2020-09-05 NOTE — H&P (Signed)
History and Physical Interval Note:  09/05/2020 10:20 AM  Karl Stevenson  has presented today for surgery, with the diagnosis of pvd.  The various methods of treatment have been discussed with the patient and family. After consideration of risks, benefits and other options for treatment, the patient has consented to  Procedure(s): ABDOMINAL AORTOGRAM W/LOWER EXTREMITY (N/A) as a surgical intervention.  The patient's history has been reviewed, patient examined, no change in status, stable for surgery.  I have reviewed the patient's chart and labs.  Questions were answered to the patient's satisfaction.    Aortogram, lower extremity arteriogram, right foot wound.  Karl Stevenson  Patient name: Karl Stevenson            MRN: [id number]        DOB: 30-May-1926        Sex: male  REASON FOR CONSULT: Nonhealing right foot wound  HPI: Karl Stevenson is a 87 y.o. male, with history of ischemic cardiomyopathy, coronary artery disease, COPD, HTN, HLD, PVD that presents for evaluation of nonhealing right foot wound.  Patient states he had a wound on his right foot for about 1 month that has been not healing.  He is well-known to our practice and had a left femoral endarterectomy in 2013 and then a right femoral endarterectomy in 2013 as well by Dr. Imogene Burn.  He denies any severe rest pain at night.  He is ambulatory with a cane.  He has been keeping a Band-Aid on the wound.      Past Medical History:  Diagnosis Date  . AAA (abdominal aortic aneurysm) (HCC)    a. Ectatic abdominal aorta with fusiform dilitation 3.4x3.4 and moderate thrombus burden 12/2010  . Bradycardia    a. previous intolerance to beta blockers - low dose toprol started 10/2011 for tachycardia but had rash and was discontinued;  b. tolerating low dose propranolol.  . Carotid artery occlusion    a. s/p L CEA in 2010;  b. 06/2014 Carotid U/S: stable LICA CEA site w/o significant stenosis bilat.  . Chronic systolic heart  failure (HCC)    a. 02/2015 Echo:  35-40% (echo 11/2010); c. 02/2015 Echo: EF 20-25%, diff HK, mid-apicalanteroseptal and apical AK, Gr 1 DD, mild MR.  [name] COPD (chronic obstructive pulmonary disease) (HCC)   . Coronary artery disease    a. 1969 s/p MI;  b. S/P CABG 1987;  c. S/P redo 2008;  d. 07/2012 Cath: stable->Med Rx, EF 40%; e. 02/2014 MV: anterior and posterolateral/apical infarct with mild peri-infarct ischemia->Med Rx; f. 02/2015 Cath: LM 100d, LAD 100ost, LCX 100ost, OM2 100, VG->OM3->OM4 nl, VG-> LAD nl (Y from VG->OM), VG->OM2 100p.  . Crohn's disease (HCC)    a. s/p colon resection  . Gout   . Hyperlipidemia   . Hypertension   . Ischemic cardiomyopathy    a. EF 34% (Lexiscan 06/2011); b. 35-40% (echo 11/2010); c. 02/2015 Echo: EF 20-25%, diff HK, mid-apicalanteroseptal and apical AK, Gr 1 DD, mild MR.  . Peripheral vascular disease (HCC)    a. Carotid dz s/p L CEA, RAS, AAA, LE dz  . RBBB   . Renal artery stenosis (HCC)    a. Dopplers 12/2010 - 1-59% R ostial renal artery stenosis, 2 L renal arteries both widely patent  . Syncope   . Tachycardia    a. 10/2011 - atrial tach vs avnrt - BB started.  . Ventricular tachycardia (HCC)    a. 02/2015 ->amio 200 daily added-->later switched  to mexilitene.         Past Surgical History:  Procedure Laterality Date  . ABDOMINAL AORTAGRAM N/A 06/11/2011   Procedure: ABDOMINAL [name];  Surgeon: [name], MD;  Location: [facility] CATH LAB;  Service: Cardiovascular;  Laterality: N/A;  . CARDIAC CATHETERIZATION  07/28/2012   Native 3v CAD, continued patency of the SVG-OM1-LAD and SVG-OM2-left PDA. LVEF 40% with multiple WMAs  . CARDIAC CATHETERIZATION N/A 03/08/2015   Procedure: Left Heart Cath and Coronary Angiography;  Surgeon: [name], MD;  Location: [facility] INVASIVE CV LAB;  Service: Cardiovascular;  Laterality: N/A;  . CAROTID ENDARTERECTOMY Left 2010  . CATARACT EXTRACTION, BILATERAL    . COLON RESECTION   2008  . COLON SURGERY    . CORONARY ANGIOPLASTY    . CORONARY ARTERY BYPASS GRAFT  10/31/85 & 08/19/06  . ENDARTERECTOMY  08/26/2011   Procedure: ENDARTERECTOMY ILIAC;  Surgeon: [name], MD;  Location: [facility] OR;  Service: Vascular;  Laterality: Right;  Right Femoral Artery Endarterectomy with vascu guard patch angioplasty & intraoperative arteriogram.  . EYE SURGERY    . FEMORAL ENDARTERECTOMY Left  06/2011   ileofemoral endarterectomy with bovine patch angioplasty  . LEFT HEART CATHETERIZATION WITH CORONARY ANGIOGRAM N/A 07/28/2012   Procedure: LEFT HEART CATHETERIZATION WITH CORONARY ANGIOGRAM;  Surgeon: [name], MD;  Location: [facility] CATH LAB;  Service: Cardiovascular;  Laterality: N/A;  . PR VEIN BYPASS GRAFT,AORTO-FEM-POP  10/1985  . TONSILLECTOMY     as a child         Family History  Problem Relation Age of Onset  . Heart disease Father   . Cancer Mother   . COPD Mother   . Deep vein thrombosis Brother   . COPD Brother   . CAD Brother   . Anesthesia problems Neg Hx     SOCIAL HISTORY: Social History        Socioeconomic History  . Marital status: Married    Spouse name: Not on file  . Number of children: Not on file  . Years of education: Not on file  . Highest education level: Not on file  Occupational History  . Not on file  Tobacco Use  . Smoking status: Never Smoker  . Smokeless tobacco: Never Used  Vaping Use  . Vaping Use: Never used  Substance and Sexual Activity  . Alcohol use: No  . Drug use: No  . Sexual activity: Not on file  Other Topics Concern  . Not on file  Social History Narrative  . Not on file   Social Determinants of Health   Financial Resource Strain: Not on file  Food Insecurity: Not on file  Transportation Needs: Not on file  Physical Activity: Not on file  Stress: Not on file  Social Connections: Not on file  Intimate Partner Violence: Not on file         Allergies  Allergen Reactions  .  Amoxicillin     diaharrea  . Novocain [Procaine Hcl] Other (See Comments)    Cold sweats and very bad headache.   . Procaine Other (See Comments)    Reaction not recalled by the patient  . Procaine Other (See Comments)    Sweating headache  . Metoprolol Hives, Itching and Rash          Current Outpatient Medications  Medication Sig Dispense Refill  . acetaminophen (TYLENOL) 500 MG tablet Take 1,000 mg by mouth every 6 (six) hours as needed (pain).     [name]  aspirin EC 81 MG tablet Take 81 mg by mouth daily.    [name] atorvastatin (LIPITOR) 10 MG tablet Take 10 mg by mouth daily.    . carboxymethylcellulose (REFRESH PLUS) 0.5 % SOLN Place 1 drop into both eyes 4 (four) times daily.    . clopidogrel (PLAVIX) 75 MG tablet TAKE 1 TABLET EVERY DAY 90 tablet 3  . cyclobenzaprine (FLEXERIL) 10 MG tablet     . ipratropium (ATROVENT) 0.03 % nasal spray Place 1 spray into both nostrils daily.    . isosorbide mononitrate (IMDUR) 120 MG 24 hr tablet Take 120 mg by mouth daily.    . isosorbide mononitrate (IMDUR) 60 MG 24 hr tablet Take 60 mg by mouth daily.    [name] losartan (COZAAR) 25 MG tablet TAKE 1 TABLET TWICE DAILY 180 tablet 2  . mexiletine (MEXITIL) 200 MG capsule Take 1 capsule (200 mg total) by mouth every 12 (twelve) hours. 180 capsule 2  . Multiple Vitamin (MULTIVITAMIN WITH MINERALS) TABS tablet Take 2 tablets by mouth daily.    . nitroGLYCERIN (NITROSTAT) 0.4 MG SL tablet Place 1 tablet (0.4 mg total) under the tongue every 5 (five) minutes as needed for chest pain. 25 tablet 3  . pantoprazole (PROTONIX) 40 MG tablet TAKE 1 TABLET EVERY DAY 90 tablet 3  . polyethylene glycol powder (GLYCOLAX/MIRALAX) powder Take 17 g by mouth at bedtime. Mix in 8 oz liquid and drink    . ranolazine (RANEXA) 500 MG 12 hr tablet TAKE 2 TABLETS TWICE DAILY 360 tablet 2  . vitamin B-12 (CYANOCOBALAMIN) 1000 MCG tablet Take 2,000 mcg by mouth daily.     No current  facility-administered medications for this visit.    REVIEW OF SYSTEMS:  [X]  denotes positive finding, [ ]  denotes negative finding Cardiac  Comments:  Chest pain or chest pressure:    Shortness of breath upon exertion:    Short of breath when lying flat:    Irregular heart rhythm:        Vascular    Pain in calf, thigh, or hip brought on by ambulation:    Pain in feet at night that wakes you up from your sleep:     Blood clot in your veins:    Leg swelling:         Pulmonary    Oxygen at home:    Productive cough:     Wheezing:         Neurologic    Sudden weakness in arms or legs:     Sudden numbness in arms or legs:     Sudden onset of difficulty speaking or slurred speech:    Temporary loss of vision in one eye:     Problems with dizziness:         Gastrointestinal    Blood in stool:     Vomited blood:         Genitourinary    Burning when urinating:     Blood in urine:        Psychiatric    Major depression:         Hematologic    Bleeding problems:    Problems with blood clotting too easily:        Skin    Rashes or ulcers:        Constitutional    Fever or chills:      PHYSICAL EXAM:    Vitals:   08/20/20 1018  BP: (!) 147/77  Pulse: 77  Resp: 16  Temp: (!) 97.2 F (36.2 C)  TempSrc: Temporal  SpO2: 98%  Weight: 133 lb (60.3 kg)  Height: 5\' 3"  (1.6 m)    GENERAL: The patient is a well-nourished male, in no acute distress. The vital signs are documented above. CARDIAC: There is a regular rate and rhythm.  VASCULAR:  Palpable femoral pulses bilaterally Bilateral groin incisions from previous femoral endarterectomy No palpable pedal pulses Wound on right metatarsal head as pictured below PULMONARY: No respiratory distress. ABDOMEN: Soft and non-tender. MUSCULOSKELETAL: There are no major deformities or cyanosis. NEUROLOGIC: No focal  weakness or paresthesias are detected. PSYCHIATRIC: The patient has a normal affect.      DATA:   ABI 0.95 on the right monophasic and 0.8 on the left monophasic  Carotid duplex shows minimal disease only 1-39% stenosis bilaterally  Assessment/Plan:  87 year old male who presents with nonhealing wound on the right metatarsal head as pictured above that has been present for 1 month.  He has ABI of 0.95 but I believe this is falsely elevated given abnormal waveform at the ankle that is monophasic.  He does have palpable femoral pulses but no palpable pedal pulses.  I recommended aortogram with right lower extremity arteriogram and possible intervention for CLI with tissue loss.  I recommended that we could do this as early as Thursday but they unfortunately have other appointments scheduled and would like to delay.  I am out of town next week and will do him the following week in the cath lab.  Risk benefits discussed   Saturday, MD [facility] [place] Office: [phone]

## 2020-09-05 NOTE — Op Note (Signed)
Patient name: Karl Stevenson MRN: [id number] DOB: Jun 15, 1926 Sex: male  09/05/2020 Pre-operative Diagnosis: Critical limb ischemia of the right lower extremity with tissue loss Post-operative diagnosis:  Same Surgeon:  [name], MD Procedure Performed: 1.  Ultrasound-guided access of left common femoral artery 2.  Aortogram including catheter selection of aorta 3.  Right lower extremity arteriogram with selection of third order branches 4.  Right posterior tibial artery angioplasty (3 mm x 220 mm Sterling) 5.  Mynx closure of the left common femoral artery 6.  43 minutes of monitored moderate conscious sedation time  Indications: Patient is a 87 year old male who presented for evaluation of new tissue loss in the right foot for the last month.  He is well-known to our vascular surgery practice and has had bilateral common femoral endarterectomies in 2013.  He presents today for lower extremity arteriogram with possible intervention after risk benefits discussed.  Findings:   Aortogram showed ectatic infrarenal aorta and the renal arteries were poorly visualized but no flow-limiting stenosis in the aortoiliac segment was visualized.  We only obtained right leg runoff given his underlying chronic kidney disease.  This showed a patent right common femoral, profunda, SFA, and above knee popliteal artery.  The below-knee popliteal artery had a <50% stenosis that did not appear flow limiting.  He has severe tibial disease.  The anterior tibial appears occluded shortly after takeoff.  The peroneal artery has multiple high-grade stenosis proximally and then occludes in the mid calf.  Dominant runoff is through the posterior tibial but has multifocal high-grade stenosis greater than 90% throughout the segment from the takeoff all the way to just above the ankle.  Ultimately after left common femoral artery access was obtained we crossed all the stenotic lesions in the right PT and the  entire artery was angioplastied with a 3 mm Sterling to nominal pressure for 2 minutes with excellent results and no residual stenosis and good flow into the foot.   Procedure:  The patient was identified in the holding area and taken to room 8.  The patient was then placed supine on the table and prepped and draped in the usual sterile fashion.  A time out was called.  Ultrasound was used to evaluate the left common femoral artery.  It was patent .  A digital ultrasound image was acquired.  A micropuncture needle was used to access the left common femoral artery under ultrasound guidance.  An 018 wire was advanced without resistance and a micropuncture sheath was placed.  The 018 wire was removed and a Verscore wire was placed.  The micropuncture sheath was exchanged for a 5 french sheath.  An omniflush catheter was advanced over the wire to the level of L-1.  An abdominal angiogram was obtained.  Next, using the omniflush catheter and a Versacore wire, the aortic bifurcation was crossed and the catheter was placed into theright external iliac artery and right runoff was obtained.  After evaluating images he had severe tibial disease.  Elected to intervene on the posterior tibial artery.  We then used a versa core wire down the right SFA and exchanged for a long 5 [place] Ansell sheath in the left groin over the aortic bifurcation.  Patient was given 100 units/kg heparin IV.  Then used V 18 wire with a CXI catheter to cross the SFA popliteal into the posterior tibial and crossed all the high-grade stenosis in the posterior tibial artery and got my wire into the ankle where  we confirmed on hand-injection we were in the true lumen.  The entire posterior tibial artery was then angioplastied with a 3 mm Sterling to nominal pressure for 2 minutes.  Final angiogram showed excellent results with no residual stenosis, no dissection, and good runoff in the foot.  He is now optimized from a vascular surgery standpoint.   Wires and catheters were removed and we put a short 5 French sheath in the left groin.  Mynx closure device was deployed after left femoral access imaging was obtained.  Plan: I will have him follow-up for wound check in 1 month.  He is now optimized from a vascular surgery standpoint and has in-line flow down the right lower extremity.  Will remain on dual anti-platelet therapty.   [name], MD [facility] Office: [phone]  [name]

## 2020-09-05 NOTE — Discharge Instructions (Signed)
Femoral Site Care  This sheet gives you information about how to care for yourself after your procedure. Your health care provider may also give you more specific instructions. If you have problems or questions, contact your health care provider. What can I expect after the procedure? After the procedure, it is common to have:  Bruising that usually fades within 1-2 weeks.  Tenderness at the site. Follow these instructions at home: Wound care  Follow instructions from your health care provider about how to take care of your insertion site. Make sure you: ? Wash your hands with soap and water before you change your bandage (dressing). If soap and water are not available, use hand sanitizer. ? Change your dressing as told by your health care provider. ? Leave stitches (sutures), skin glue, or adhesive strips in place. These skin closures may need to stay in place for 2 weeks or longer. If adhesive strip edges start to loosen and curl up, you may trim the loose edges. Do not remove adhesive strips completely unless your health care provider tells you to do that.  Do not take baths, swim, or use a hot tub until your health care provider approves.  You may shower 24-48 hours after the procedure or as told by your health care provider. ? Gently wash the site with plain soap and water. ? Pat the area dry with a clean towel. ? Do not rub the site. This may cause bleeding.  Do not apply powder or lotion to the site. Keep the site clean and dry.  Check your femoral site every day for signs of infection. Check for: ? Redness, swelling, or pain. ? Fluid or blood. ? Warmth. ? Pus or a bad smell. Activity  For the first 2-3 days after your procedure, or as long as directed: ? Avoid climbing stairs as much as possible. ? Do not squat.  Do not lift anything that is heavier than 10 lb (4.5 kg), or the limit that you are told, until your health care provider says that it is safe.  Rest as  directed. ? Avoid sitting for a long time without moving. Get up to take short walks every 1-2 hours.  Do not drive for 24 hours if you were given a medicine to help you relax (sedative). General instructions  Take over-the-counter and prescription medicines only as told by your health care provider.  Keep all follow-up visits as told by your health care provider. This is important. Contact a health care provider if you have:  A fever or chills.  You have redness, swelling, or pain around your insertion site. Get help right away if:  The catheter insertion area swells very fast.  You pass out.  You suddenly start to sweat or your skin gets clammy.  The catheter insertion area is bleeding, and the bleeding does not stop when you hold steady pressure on the area.  The area near or just beyond the catheter insertion site becomes pale, cool, tingly, or numb. These symptoms may represent a serious problem that is an emergency. Do not wait to see if the symptoms will go away. Get medical help right away. Call your local emergency services (911 in the [place] Do not drive yourself to the hospital. Summary  After the procedure, it is common to have bruising that usually fades within 1-2 weeks.  Check your femoral site every day for signs of infection.  Do not lift anything that is heavier than 10 lb (4.5 kg), or   the limit that you are told, until your health care provider says that it is safe. This information is not intended to replace advice given to you by your health care provider. Make sure you discuss any questions you have with your health care provider. Document Revised: 12/29/2019 Document Reviewed: 12/29/2019 Elsevier Patient Education  2021 [employer]vier Inc.  

## 2020-09-06 ENCOUNTER — Encounter (HOSPITAL_COMMUNITY): Payer: Self-pay | Admitting: Vascular Surgery

## 2020-09-13 ENCOUNTER — Other Ambulatory Visit: Payer: Self-pay

## 2020-09-13 DIAGNOSIS — I739 Peripheral vascular disease, unspecified: Secondary | ICD-10-CM

## 2020-09-20 ENCOUNTER — Telehealth: Payer: Self-pay | Admitting: *Deleted

## 2020-09-20 ENCOUNTER — Other Ambulatory Visit: Payer: Self-pay | Admitting: *Deleted

## 2020-09-20 DIAGNOSIS — I739 Peripheral vascular disease, unspecified: Secondary | ICD-10-CM

## 2020-09-20 NOTE — Telephone Encounter (Signed)
Pt called concerned about open wound on left foot. Wound has been present for about a week. Pt has known PAD and had right tibial angioplasty 09/05/20. Pt is concerned that the left foot is starting to do what the right foot was doing. Going to make appt for pt to see Dr. Chestine Spore on Tuesday.

## 2020-09-24 ENCOUNTER — Other Ambulatory Visit: Payer: Self-pay

## 2020-09-24 ENCOUNTER — Encounter: Payer: Self-pay | Admitting: Vascular Surgery

## 2020-09-24 ENCOUNTER — Ambulatory Visit (HOSPITAL_COMMUNITY)
Admission: RE | Admit: 2020-09-24 | Discharge: 2020-09-24 | Disposition: A | Discharge status: Home / Self Care | Payer: Medicare PPO | Source: Ambulatory Visit | Attending: Vascular Surgery | Admitting: Vascular Surgery

## 2020-09-24 ENCOUNTER — Ambulatory Visit: Payer: Medicare PPO | Admitting: Vascular Surgery

## 2020-09-24 VITALS — BP 128/71 | HR 70 | Temp 97.3°F | Resp 16 | Ht 63.0 in | Wt 136.0 lb

## 2020-09-24 DIAGNOSIS — I739 Peripheral vascular disease, unspecified: Secondary | ICD-10-CM | POA: Diagnosis not present

## 2020-09-24 NOTE — Progress Notes (Signed)
Patient name: Karl Stevenson MRN: [id number] DOB: 1926/08/14 Sex: male  REASON FOR CONSULT: New left foot wound and re-evaluate right foot wound non-healing  HPI: Karl Stevenson is a 87 y.o. male, with history of ischemic cardiomyopathy, coronary artery disease, COPD, HTN, HLD, PVD that presents for evaluation of new left foot wound and concern that his right wound is nonhealing.  I initially saw him for right foot wound.  At that time he underwent right PT angioplasty on 09/05/2020.  He continues to put cream on it but is unclear exactly what cream.  In addition he developed a new area on the left foot that he wanted me to examine as well.  States the area on his left foot has actually healed.   He is well-known to our practice and had a left femoral endarterectomy in 2013 and then a right femoral endarterectomy in 2013 as well by Dr. Imogene Burn.    Past Medical History:  Diagnosis Date  . AAA (abdominal aortic aneurysm) (HCC)    a. Ectatic abdominal aorta with fusiform dilitation 3.4x3.4 and moderate thrombus burden 12/2010  . Bradycardia    a. previous intolerance to beta blockers - low dose toprol started 10/2011 for tachycardia but had rash and was discontinued;  b. tolerating low dose propranolol.  . Carotid artery occlusion    a. s/p L CEA in 2010;  b. 06/2014 Carotid U/S: stable LICA CEA site w/o significant stenosis bilat.  . Chronic systolic heart failure (HCC)    a. 02/2015 Echo:  35-40% (echo 11/2010); c. 02/2015 Echo: EF 20-25%, diff HK, mid-apicalanteroseptal and apical AK, Gr 1 DD, mild MR.  [name] COPD (chronic obstructive pulmonary disease) (HCC)   . Coronary artery disease    a. 1969 s/p MI;  b. S/P CABG 1987;  c. S/P redo 2008;  d. 07/2012 Cath: stable->Med Rx, EF 40%; e. 02/2014 MV: anterior and posterolateral/apical infarct with mild peri-infarct ischemia->Med Rx; f. 02/2015 Cath: LM 100d, LAD 100ost, LCX 100ost, OM2 100, VG->OM3->OM4 nl, VG-> LAD nl (Y from VG->OM), VG->OM2 100p.  .  Crohn's disease (HCC)    a. s/p colon resection  . Gout   . Hyperlipidemia   . Hypertension   . Ischemic cardiomyopathy    a. EF 34% (Lexiscan 06/2011); b. 35-40% (echo 11/2010); c. 02/2015 Echo: EF 20-25%, diff HK, mid-apicalanteroseptal and apical AK, Gr 1 DD, mild MR.  . Peripheral vascular disease (HCC)    a. Carotid dz s/p L CEA, RAS, AAA, LE dz  . RBBB   . Renal artery stenosis (HCC)    a. Dopplers 12/2010 - 1-59% R ostial renal artery stenosis, 2 L renal arteries both widely patent  . Syncope   . Tachycardia    a. 10/2011 - atrial tach vs avnrt - BB started.  . Ventricular tachycardia (HCC)    a. 02/2015 ->amio 200 daily added-->later switched to mexilitene.    Past Surgical History:  Procedure Laterality Date  . ABDOMINAL AORTAGRAM N/A 06/11/2011   Procedure: ABDOMINAL [name];  Surgeon: [name], MD;  Location: [facility] CATH LAB;  Service: Cardiovascular;  Laterality: N/A;  . ABDOMINAL AORTOGRAM W/LOWER EXTREMITY N/A 09/05/2020   Procedure: ABDOMINAL AORTOGRAM W/LOWER EXTREMITY;  Surgeon: [name], MD;  Location: [facility] INVASIVE CV LAB;  Service: Cardiovascular;  Laterality: N/A;  . CARDIAC CATHETERIZATION  07/28/2012   Native 3v CAD, continued patency of the SVG-OM1-LAD and SVG-OM2-left PDA. LVEF 40% with multiple WMAs  . CARDIAC CATHETERIZATION N/A 03/08/2015  Procedure: Left Heart Cath and Coronary Angiography;  Surgeon: [name], MD;  Location: [facility] INVASIVE CV LAB;  Service: Cardiovascular;  Laterality: N/A;  . CAROTID ENDARTERECTOMY Left 2010  . CATARACT EXTRACTION, BILATERAL    . COLON RESECTION  2008  . COLON SURGERY    . CORONARY ANGIOPLASTY    . CORONARY ARTERY BYPASS GRAFT  10/31/85 & 08/19/06  . ENDARTERECTOMY  08/26/2011   Procedure: ENDARTERECTOMY ILIAC;  Surgeon: [name], MD;  Location: [facility] OR;  Service: Vascular;  Laterality: Right;  Right Femoral Artery Endarterectomy with vascu guard patch angioplasty & intraoperative arteriogram.  . EYE SURGERY     . FEMORAL ENDARTERECTOMY Left  06/2011   ileofemoral endarterectomy with bovine patch angioplasty  . LEFT HEART CATHETERIZATION WITH CORONARY ANGIOGRAM N/A 07/28/2012   Procedure: LEFT HEART CATHETERIZATION WITH CORONARY ANGIOGRAM;  Surgeon: [name], MD;  Location: [facility] LAB;  Service: Cardiovascular;  Laterality: N/A;  . PERIPHERAL VASCULAR BALLOON ANGIOPLASTY Right 09/05/2020   Procedure: PERIPHERAL VASCULAR BALLOON ANGIOPLASTY;  Surgeon: [name], MD;  Location: [facility] INVASIVE CV LAB;  Service: Cardiovascular;  Laterality: Right;  Posterior Tibial  . PR VEIN BYPASS GRAFT,AORTO-FEM-POP  10/1985  . TONSILLECTOMY     as a child    Family History  Problem Relation Age of Onset  . Heart disease Father   . Cancer Mother   . COPD Mother   . Deep vein thrombosis Brother   . COPD Brother   . CAD Brother   . Anesthesia problems Neg Hx     SOCIAL HISTORY: Social History   Socioeconomic History  . Marital status: Married    Spouse name: Not on file  . Number of children: Not on file  . Years of education: Not on file  . Highest education level: Not on file  Occupational History  . Not on file  Tobacco Use  . Smoking status: Never Smoker  . Smokeless tobacco: Never Used  Vaping Use  . Vaping Use: Never used  Substance and Sexual Activity  . Alcohol use: No  . Drug use: No  . Sexual activity: Not on file  Other Topics Concern  . Not on file  Social History Narrative  . Not on file   Social Determinants of Health   Financial Resource Strain: Not on file  Food Insecurity: Not on file  Transportation Needs: Not on file  Physical Activity: Not on file  Stress: Not on file  Social Connections: Not on file  Intimate Partner Violence: Not on file    Allergies  Allergen Reactions  . Amoxicillin     diaharrea  . Novocain [Procaine Hcl] Other (See Comments)    Cold sweats and very bad headache.   . Procaine Other (See Comments)    Sweating headache  .  Metoprolol Hives, Itching and Rash    Current Outpatient Medications  Medication Sig Dispense Refill  . acetaminophen (TYLENOL) 500 MG tablet Take 1,000 mg by mouth every 6 (six) hours as needed (pain).     [name] aspirin EC 81 MG tablet Take 81 mg by mouth daily.    [name] atorvastatin (LIPITOR) 10 MG tablet Take 10 mg by mouth daily.    . carboxymethylcellulose (REFRESH PLUS) 0.5 % SOLN Place 1 drop into both eyes 4 (four) times daily.    . cholecalciferol (VITAMIN D3) 25 MCG (1000 UNIT) tablet Take 1,000 Units by mouth daily.    . clopidogrel (PLAVIX) 75 MG tablet TAKE 1 TABLET  EVERY DAY (Patient taking differently: Take 75 mg by mouth daily.) 90 tablet 3  . fexofenadine (ALLEGRA) 180 MG tablet Take 180 mg by mouth daily.    [name] ipratropium (ATROVENT) 0.03 % nasal spray Place 1 spray into both nostrils daily.    . isosorbide mononitrate (IMDUR) 120 MG 24 hr tablet Take 120 mg by mouth daily.    . isosorbide mononitrate (IMDUR) 60 MG 24 hr tablet Take 60 mg by mouth daily.    [name] losartan (COZAAR) 25 MG tablet TAKE 1 TABLET TWICE DAILY (Patient taking differently: Take 25 mg by mouth in the morning and at bedtime.) 180 tablet 2  . mexiletine (MEXITIL) 200 MG capsule Take 1 capsule (200 mg total) by mouth every 12 (twelve) hours. 180 capsule 2  . Multiple Vitamin (MULTIVITAMIN WITH MINERALS) TABS tablet Take 2 tablets by mouth daily.    . mupirocin ointment (BACTROBAN) 2 % Apply 1 application topically 2 (two) times daily. Apply to foot    . nitroGLYCERIN (NITROSTAT) 0.4 MG SL tablet Place 1 tablet (0.4 mg total) under the tongue every 5 (five) minutes as needed for chest pain. 25 tablet 3  . OVER THE COUNTER MEDICATION Take 1 capsule by mouth daily. Nerve Health    . pantoprazole (PROTONIX) 40 MG tablet TAKE 1 TABLET EVERY DAY (Patient taking differently: Take 40 mg by mouth daily.) 90 tablet 3  . Polyethyl Glycol-Propyl Glycol (SYSTANE) 0.4-0.3 % SOLN Place 1 drop into both eyes in the morning and at  bedtime.    . polyethylene glycol powder (GLYCOLAX/MIRALAX) powder Take 17 g by mouth at bedtime. Mix in 8 oz liquid and drink    . ranolazine (RANEXA) 500 MG 12 hr tablet TAKE 2 TABLETS TWICE DAILY 360 tablet 3  . triamcinolone ointment (KENALOG) 0.1 % Apply 1 application topically 2 (two) times daily. Mix with Mupirocin and apply to head    . vitamin B-12 (CYANOCOBALAMIN) 1000 MCG tablet Take 2,000 mcg by mouth daily.     No current facility-administered medications for this visit.    REVIEW OF SYSTEMS:  [X]  denotes positive finding, [ ]  denotes negative finding Cardiac  Comments:  Chest pain or chest pressure:    Shortness of breath upon exertion:    Short of breath when lying flat:    Irregular heart rhythm:        Vascular    Pain in calf, thigh, or hip brought on by ambulation:    Pain in feet at night that wakes you up from your sleep:     Blood clot in your veins:    Leg swelling:         Pulmonary    Oxygen at home:    Productive cough:     Wheezing:         Neurologic    Sudden weakness in arms or legs:     Sudden numbness in arms or legs:     Sudden onset of difficulty speaking or slurred speech:    Temporary loss of vision in one eye:     Problems with dizziness:         Gastrointestinal    Blood in stool:     Vomited blood:         Genitourinary    Burning when urinating:     Blood in urine:        Psychiatric    Major depression:         Hematologic  Bleeding problems:    Problems with blood clotting too easily:        Skin    Rashes or ulcers:        Constitutional    Fever or chills:      PHYSICAL EXAM: Vitals:   09/24/20 1206  BP: 128/71  Pulse: 70  Resp: 16  Temp: (!) 97.3 F (36.3 C)  TempSrc: Temporal  SpO2: 98%  Weight: 136 lb (61.7 kg)  Height: 5\' 3"  (1.6 m)    GENERAL: The patient is a well-nourished male, in no acute distress. The vital signs are documented above. CARDIAC: There is a regular rate and rhythm.  VASCULAR:   Palpable femoral pulses bilaterally and well healed bilateral groin incisions from previous femoral endarterectomy Right PT sounds triphasic at the ankle with doppler Wound on right metatarsal head as pictured below No significant left wound at this time        DATA:   ABIs today are improved 1.17 on the right triphasic after revascularization and remained 0.87 monophasic on the left with depressed waveform  Assessment/Plan:  87 year old male who initially presented with nonhealing wound at the right metatarsal head.  Ultimately underwent right PT angioplasty on 09/05/2020 and now has inline flow with a very brisk triphasic signal at the right ankle.  Discussed with him and his son that I think he has had a very good result from revascularization.  They are concerned that his wound has been nonhealing and I discussed that sometimes this can take time.  I am unclear exactly what wound care he is doing and discussed that he wash the foot with Dial soap which is antibacterial and then apply Betadine paint over the wound.  I will also refer to Dr. Leanord Hawking at the wound clinic for any further recommendations.  I think the left foot can be left alone for now.  As pictured above this looks very superficial and I do not see any open skin breakdown at this time.  I will see him again in 1 month for wound check.  [name], MD [facility] Office: [phone]

## 2020-09-26 ENCOUNTER — Other Ambulatory Visit: Payer: Self-pay

## 2020-09-26 ENCOUNTER — Encounter (HOSPITAL_BASED_OUTPATIENT_CLINIC_OR_DEPARTMENT_OTHER): Payer: Medicare PPO | Attending: Internal Medicine | Admitting: Internal Medicine

## 2020-09-26 DIAGNOSIS — I429 Cardiomyopathy, unspecified: Secondary | ICD-10-CM | POA: Diagnosis not present

## 2020-09-26 DIAGNOSIS — I110 Hypertensive heart disease with heart failure: Secondary | ICD-10-CM | POA: Insufficient documentation

## 2020-09-26 DIAGNOSIS — J449 Chronic obstructive pulmonary disease, unspecified: Secondary | ICD-10-CM | POA: Diagnosis not present

## 2020-09-26 DIAGNOSIS — I25119 Atherosclerotic heart disease of native coronary artery with unspecified angina pectoris: Secondary | ICD-10-CM | POA: Diagnosis not present

## 2020-09-26 DIAGNOSIS — L97519 Non-pressure chronic ulcer of other part of right foot with unspecified severity: Secondary | ICD-10-CM | POA: Diagnosis not present

## 2020-09-26 DIAGNOSIS — I252 Old myocardial infarction: Secondary | ICD-10-CM | POA: Insufficient documentation

## 2020-09-26 DIAGNOSIS — G9009 Other idiopathic peripheral autonomic neuropathy: Secondary | ICD-10-CM | POA: Diagnosis not present

## 2020-09-26 DIAGNOSIS — Z888 Allergy status to other drugs, medicaments and biological substances status: Secondary | ICD-10-CM | POA: Insufficient documentation

## 2020-09-26 DIAGNOSIS — Z880 Allergy status to penicillin: Secondary | ICD-10-CM | POA: Diagnosis not present

## 2020-09-26 DIAGNOSIS — L97512 Non-pressure chronic ulcer of other part of right foot with fat layer exposed: Secondary | ICD-10-CM | POA: Insufficient documentation

## 2020-09-26 DIAGNOSIS — I5022 Chronic systolic (congestive) heart failure: Secondary | ICD-10-CM | POA: Diagnosis not present

## 2020-09-26 DIAGNOSIS — I739 Peripheral vascular disease, unspecified: Secondary | ICD-10-CM | POA: Diagnosis not present

## 2020-09-26 NOTE — Progress Notes (Signed)
Karl Stevenson ([id number]) Visit Report for 09/26/2020 Allergy List Details Patient Name: Date of Service: Karl Stevenson MES C. 09/26/2020 2:45 PM Medical Record Number: [id number] Patient Account Number: [id number] Date of Birth/Sex: Treating RN: [birth year] (87 y.o. Male) Lorrin Jackson Primary Care Banjamin Stovall: [name] Other Clinician: Referring Aubryn Spinola: Treating Alyviah Crandle/Extender: Jonna Clark in Treatment: 0 Allergies Active Allergies Novocain Reaction: cold sweats, headache amoxicillin Reaction: diarrhea metoprolol Reaction: hives procaine Allergy Notes Electronic Signature(s) Signed: 09/26/2020 5:39:50 PM By: Lorrin Jackson Entered By: Lorrin Jackson on 09/26/2020 14:57:00 -------------------------------------------------------------------------------- Arrival Information Details Patient Name: Date of Service: Karl Stevenson MES C. 09/26/2020 2:45 PM Medical Record Number: [id number] Patient Account Number: [id number] Date of Birth/Sex: Treating RN: May 14, 1926 (87 y.o. Male) Lorrin Jackson Primary Care Demarrion Meiklejohn: [name] Other Clinician: Referring Nielle Duford: Treating Renold Kozar/Extender: Jonna Clark in Treatment: 0 Visit Information Patient Arrived: Charlyn Minerva Time: 14:50 Accompanied By: wife Transfer Assistance: None Patient Identification Verified: Yes Secondary Verification Process Completed: Yes Patient Requires Transmission-Based Precautions: No Patient Has Alerts: Yes Patient Alerts: Patient on Blood Thinner ABI R=1.17 L=0.87 TBI R=0.51 L=0.55 Electronic Signature(s) Signed: 09/26/2020 5:39:50 PM By: Lorrin Jackson Entered By: Lorrin Jackson on 09/26/2020 15:10:54 -------------------------------------------------------------------------------- Clinic Level of Care Assessment Details Patient Name: Date of Service: Karl Stevenson MES C. 09/26/2020 2:45 PM Medical Record Number:  [id number] Patient Account Number: [id number] Date of Birth/Sex: Treating RN: 10-07-1926 (87 y.o. Male) Deon Pilling Primary Care Aven Cegielski: [name] Other Clinician: Referring Hero Kulish: Treating Henrik Orihuela/Extender: Jonna Clark in Treatment: 0 Clinic Level of Care Assessment Items TOOL 1 Quantity Score X- 1 0 Use when EandM and Procedure is performed on INITIAL visit ASSESSMENTS - Nursing Assessment / Reassessment X- 1 20 General Physical Exam (combine w/ comprehensive assessment (listed just below) when performed on new pt. evals) X- 1 25 Comprehensive Assessment (HX, ROS, Risk Assessments, Wounds Hx, etc.) ASSESSMENTS - Wound and Skin Assessment / Reassessment X- 1 10 Dermatologic / Skin Assessment (not related to wound area) ASSESSMENTS - Ostomy and/or Continence Assessment and Care _0  - 0 Incontinence Assessment and Management _1  - 0 Ostomy Care Assessment and Management (repouching, etc.) PROCESS - Coordination of Care X - Simple Patient / Family Education for ongoing care 1 15 _2  - 0 Complex (extensive) Patient / Family Education for ongoing care X- 1 10 Staff obtains Programmer, systems, Records, T Results / Process Orders est _3  - 0 Staff telephones HHA, Nursing Homes / Clarify orders / etc _4  - 0 Routine Transfer to another Facility (non-emergent condition) _5  - 0 Routine Hospital Admission (non-emergent condition) X- 1 15 New Admissions / Biomedical engineer / Ordering NPWT Apligraf, etc. , _6  - 0 Emergency Hospital Admission (emergent condition) PROCESS - Special Needs _7  - 0 Pediatric / Minor Patient Management _8  - 0 Isolation Patient Management _9  - 0 Hearing / Language / Visual special needs _10  - 0 Assessment of Community assistance (transportation, D/C planning, etc.) _11  - 0 Additional assistance / Altered mentation _12  - 0 Support Surface(s) Assessment (bed, cushion, seat, etc.) INTERVENTIONS - Miscellaneous _13  -  0 External ear exam _14  - 0 Patient Transfer (multiple staff / Civil Service fast streamer / Similar devices) _15  - 0 Simple Staple / Suture removal (25 or less) _16  - 0 Complex Staple / Suture removal (26 or more) _17  - 0 Hypo/Hyperglycemic Management (do not check if billed separately) _18  - 0 Ankle / Brachial Index (ABI) - do not check if billed separately Has the patient  been seen at the hospital within the last three years: Yes Total Score: 95 Level Of Care: New/Established - Level 3 Electronic Signature(s) Signed: 09/26/2020 6:00:49 PM By: Deon Pilling Entered By: Deon Pilling on 09/26/2020 15:43:27 -------------------------------------------------------------------------------- Encounter Discharge Information Details Patient Name: Date of Service: Karl Stevenson MES C. 09/26/2020 2:45 PM Medical Record Number: [id number] Patient Account Number: [id number] Date of Birth/Sex: Treating RN: 05/25/1926 (87 y.o. Male) Baruch Gouty Primary Care Cosimo Schertzer: [name] Other Clinician: Referring Jenaveve Fenstermaker: Treating Leeam Cedrone/Extender: Jonna Clark in Treatment: 0 Encounter Discharge Information Items Post Procedure Vitals Discharge Condition: Stable Temperature (F): 97.5 Ambulatory Status: Walker Pulse (bpm): 76 Discharge Destination: Home Respiratory Rate (breaths/min): 18 Transportation: Private Auto Blood Pressure (mmHg): 144/82 Accompanied By: spouse Schedule Follow-up Appointment: Yes Clinical Summary of Care: Patient Declined Electronic Signature(s) Signed: 09/26/2020 5:19:49 PM By: Baruch Gouty RN, BSN Entered By: Baruch Gouty on 09/26/2020 16:10:39 -------------------------------------------------------------------------------- Lower Extremity Assessment Details Patient Name: Date of Service: [name], [place] MES C. 09/26/2020 2:45 PM Medical Record Number: [id number] Patient Account Number: [id number] Date of Birth/Sex: Treating RN: 1926/06/20  (87 y.o. Male) Lorrin Jackson Primary Care Yolando Gillum: [name] Other Clinician: Referring Vicky Schleich: Treating Myishia Kasik/Extender: Jonna Clark in Treatment: 0 Edema Assessment Assessed: [name]: Yes] [Right: Yes] Edema: [Left: No] [Right: Yes] Calf Left: Right: Point of Measurement: 29 cm From Medial Instep 31.7 cm 29.2 cm Ankle Left: Right: Point of Measurement: 8 cm From Medial Instep 19 cm 22 cm Vascular Assessment Pulses: Dorsalis Pedis Palpable: [Left:No Yes] [Right:No Yes] Notes ABI's per Vascular: R=1.17 L=0.87 TBI's per Vascular R=0.51 L=0.55 Electronic Signature(s) Signed: 09/26/2020 5:39:50 PM By: Lorrin Jackson Entered By: Lorrin Jackson on 09/26/2020 15:09:58 -------------------------------------------------------------------------------- Multi-Disciplinary Care Plan Details Patient Name: Date of Service: Karl Stevenson, Greggory Brandy MES C. 09/26/2020 2:45 PM Medical Record Number: [id number] Patient Account Number: [id number] Date of Birth/Sex: Treating RN: March 17, 1927 (87 y.o. Male) Deon Pilling Primary Care Piccola Arico: [name] Other Clinician: Referring Charmel Pronovost: Treating Gracelee Stemmler/Extender: Jonna Clark in Treatment: 0 Active Inactive Orientation to the Wound Care Program Nursing Diagnoses: Knowledge deficit related to the wound healing center program Goals: Patient/caregiver will verbalize understanding of the [place] Program Date Initiated: 09/26/2020 Target Resolution Date: 10/11/2020 Goal Status: Active Interventions: Provide education on orientation to the wound center Notes: Pain, Acute or Chronic Nursing Diagnoses: Pain, acute or chronic: actual or potential Potential alteration in comfort, pain Goals: Patient will verbalize adequate pain control and receive pain control interventions during procedures as needed Date Initiated: 09/26/2020 Target Resolution Date: 10/11/2020 Goal  Status: Active Patient/caregiver will verbalize comfort level met Date Initiated: 09/26/2020 Target Resolution Date: 10/11/2020 Goal Status: Active Interventions: Encourage patient to take pain medications as prescribed Provide education on pain management Reposition patient for comfort Treatment Activities: Administer pain control measures as ordered : 09/26/2020 Notes: Wound/Skin Impairment Nursing Diagnoses: Knowledge deficit related to ulceration/compromised skin integrity Goals: Patient/caregiver will verbalize understanding of skin care regimen Date Initiated: 09/26/2020 Target Resolution Date: 10/11/2020 Goal Status: Active Interventions: Assess patient/caregiver ability to obtain necessary supplies Assess patient/caregiver ability to perform ulcer/skin care regimen upon admission and as needed Provide education on ulcer and skin care Treatment Activities: Skin care regimen initiated : 09/26/2020 Topical wound management initiated : 09/26/2020 Notes: Electronic Signature(s) Signed: 09/26/2020 6:00:49 PM By: Deon Pilling Entered By: Deon Pilling on 09/26/2020 15:31:31 -------------------------------------------------------------------------------- Patient/Caregiver Education Details Patient Name: Date of Service: Karl Stevenson MES C. 5/19/2022andnbsp2:45 PM Medical Record Number: [id number] Patient Account  Number: [id number] Date of Birth/Gender: Treating RN: 03-08-1927 (87 y.o. Male) Deon Pilling Primary Care Physician: [name] Other Clinician: Referring Physician: Treating Physician/Extender: Jonna Clark in Treatment: 0 Education Assessment Education Provided To: Patient Education Topics Provided Welcome T [facility]: o Handouts: Welcome T [facility] o Methods: Explain/Verbal, Printed Responses: Reinforcements needed Electronic Signature(s) Signed: 09/26/2020 6:00:49 PM By: Deon Pilling Entered By:  Deon Pilling on 09/26/2020 15:31:48 -------------------------------------------------------------------------------- Wound Assessment Details Patient Name: Date of Service: Karl Stevenson, [place] MES C. 09/26/2020 2:45 PM Medical Record Number: [id number] Patient Account Number: [id number] Date of Birth/Sex: Treating RN: 02-Dec-1926 (87 y.o. Male) Lorrin Jackson Primary Care Nolberto Cheuvront: [name] Other Clinician: Referring Rebecca Motta: Treating Jayme Cham/Extender: Jonna Clark in Treatment: 0 Wound Status Wound Number: 1 Primary Neuropathic Ulcer-Non Diabetic Etiology: Wound Location: Right, Medial Foot Wound Open Wounding Event: Gradually Appeared Status: Date Acquired: 07/23/2020 Comorbid Cataracts, Chronic Obstructive Pulmonary Disease (COPD), Weeks Of Treatment: 0 History: Arrhythmia, Congestive Heart Failure, Coronary Artery Disease, Clustered Wound: No Hypertension, Myocardial Infarction, Peripheral Venous Disease, Crohns Photos Wound Measurements Length: (cm) 1.5 Width: (cm) 1 Depth: (cm) 0.4 Area: (cm) 1.178 Volume: (cm) 0.471 % Reduction in Area: 0% % Reduction in Volume: 0% Epithelialization: None Tunneling: No Undermining: No Wound Description Classification: Full Thickness Without Exposed Support Structu Wound Margin: Well defined, not attached Exudate Amount: Medium Exudate Type: Serosanguineous Exudate Color: red, brown res Foul Odor After Cleansing: No Slough/Fibrino Yes Wound Bed Granulation Amount: Medium (34-66%) Exposed Structure Granulation Quality: Red Fascia Exposed: No Necrotic Amount: Medium (34-66%) Fat Layer (Subcutaneous Tissue) Exposed: Yes Necrotic Quality: Adherent Slough Tendon Exposed: No Muscle Exposed: No Joint Exposed: No Bone Exposed: No Assessment Notes Periwound erythema, callous Treatment Notes Wound #1 (Foot) Wound Laterality: Right, Medial Cleanser [place] Ancillary Kit - 15 Day  Supply Discharge Instruction: Use supplies as instructed; Kit contains: (15) Saline Bullets; (15) 3x3 Gauze; 15 pr Gloves Soap and Water Discharge Instruction: May shower and wash wound with dial antibacterial soap and water prior to dressing change. Peri-Wound Care Topical Primary Dressing Promogran Prisma Matrix, 4.34 (sq in) (silver collagen) Discharge Instruction: Moisten collagen with ***[place] jelly.**** Secondary Dressing Woven Gauze Sponges 2x2 in Discharge Instruction: Apply over primary dressing as directed. Optifoam Non-Adhesive Dressing, 4x4 in Discharge Instruction: ***foam donut***Apply over primary dressing as directed. Secured With Conforming Stretch Gauze Bandage, Sterile 2x75 (in/in) Discharge Instruction: Secure with stretch gauze as directed. 40M Medipore H Soft Cloth Surgical T 4 x 2 (in/yd) ape Discharge Instruction: Secure dressing with tape as directed. Compression Wrap Compression Stockings Add-Ons Electronic Signature(s) Signed: 09/26/2020 4:57:25 PM By: Sandre Kitty Signed: 09/26/2020 5:39:50 PM By: Lorrin Jackson Entered By: Sandre Kitty on 09/26/2020 16:54:33 -------------------------------------------------------------------------------- Vitals Details Patient Name: Date of Service: [name], [name] 09/26/2020 2:45 PM Medical Record Number: [id number] Patient Account Number: [id number] Date of Birth/Sex: Treating RN: [birth year] (87 y.o. Male) Lorrin Jackson Primary Care Patterson Hollenbaugh: [name] Other Clinician: Referring Reet Scharrer: Treating Kambra Beachem/Extender: Jonna Clark in Treatment: 0 Vital Signs Time Taken: 14:53 Temperature (F): 97.5 Height (in): 63 Pulse (bpm): 76 Source: Stated Respiratory Rate (breaths/min): 18 Weight (lbs): 136 Blood Pressure (mmHg): 144/82 Source: Stated Reference Range: 80 - 120 mg / dl Body Mass Index (BMI): 24.1 Electronic Signature(s) Signed: 09/26/2020 5:39:50 PM By:  Lorrin Jackson Entered By: Lorrin Jackson on 09/26/2020 14:54:39

## 2020-09-26 NOTE — Progress Notes (Signed)
Karl Stevenson, Karl Stevenson ([id number]) Visit Report for 09/26/2020 Abuse/Suicide Risk Screen Details Patient Name: Date of Service: Karl Belfast MES C. 09/26/2020 2:45 PM Medical Record Number: [id number] Patient Account Number: [id number] Date of Birth/Sex: Treating RN: [birth year] (87 y.o. Male) Antonieta Iba Primary Care Maaliyah Adolph: Jarome Matin Other Clinician: Referring Leeyah Heather: Treating Dominyck Reser/Extender: Clifton Brett in Treatment: 0 Abuse/Suicide Risk Screen Items Answer ABUSE RISK SCREEN: Has anyone close to you tried to hurt or harm you recentlyo No Do you feel uncomfortable with anyone in your familyo No Has anyone forced you do things that you didnt want to doo No Electronic Signature(s) Signed: 09/26/2020 5:39:50 PM By: Antonieta Iba Entered By: Antonieta Iba on 09/26/2020 15:02:13 -------------------------------------------------------------------------------- Activities of Daily Living Details Patient Name: Date of Service: Karl Belfast MES C. 09/26/2020 2:45 PM Medical Record Number: [id number] Patient Account Number: [id number] Date of Birth/Sex: Treating RN: [birth year] (87 y.o. Male) Antonieta Iba Primary Care Mckinzy Fuller: Jarome Matin Other Clinician: Referring Mylan Lengyel: Treating Sakeena Teall/Extender: Clifton Kameran in Treatment: 0 Activities of Daily Living Items Answer Activities of Daily Living (Please select one for each item) Drive Automobile Not Able T Medications ake Completely Able Use T elephone Completely Able Care for Appearance Completely Able Use T oilet Completely Able Bath / Shower Completely Able Dress Self Completely Able Feed Self Completely Able Walk Completely Able Get In / Out Bed Completely Able Housework Completely Able Prepare Meals Completely Able Handle Money Completely Able Shop for Self Completely Able Electronic Signature(s) Signed: 09/26/2020 5:39:50 PM By: Antonieta Iba Entered By: Antonieta Iba on 09/26/2020 15:03:18 -------------------------------------------------------------------------------- Education Screening Details Patient Name: Date of Service: Karl Stevenson, Karl Kirk MES C. 09/26/2020 2:45 PM Medical Record Number: [id number] Patient Account Number: [id number] Date of Birth/Sex: Treating RN: [birth year] (87 y.o. Male) Antonieta Iba Primary Care Talisha Erby: Jarome Matin Other Clinician: Referring Jerelle Virden: Treating Darvis Croft/Extender: Clifton Tajuan in Treatment: 0 Primary Learner Assessed: Patient Learning Preferences/Education Level/Primary Language Learning Preference: Explanation, Demonstration, Printed Material Highest Education Level: College or Above Preferred Language: English Cognitive Barrier Language Barrier: No Translator Needed: No Memory Deficit: No Emotional Barrier: No Cultural/Religious Beliefs Affecting Medical Care: No Physical Barrier Impaired Vision: Yes Glasses Impaired Hearing: No Decreased Hand dexterity: No Knowledge/Comprehension Knowledge Level: High Comprehension Level: High Ability to understand written instructions: High Ability to understand verbal instructions: High Motivation Anxiety Level: Calm Cooperation: Cooperative Education Importance: Acknowledges Need Interest in Health Problems: Asks Questions Perception: Coherent Willingness to Engage in Self-Management High Activities: Readiness to Engage in Self-Management High Activities: Electronic Signature(s) Signed: 09/26/2020 5:39:50 PM By: Antonieta Iba Entered By: Antonieta Iba on 09/26/2020 15:03:46 -------------------------------------------------------------------------------- Fall Risk Assessment Details Patient Name: Date of Service: Karl ILIFF, [name] 09/26/2020 2:45 PM Medical Record Number: [id number] Patient Account Number: [id number] Date of Birth/Sex: Treating RN: [birth year] (87 y.o. Male)  Antonieta Iba Primary Care Varun Jourdan: Jarome Matin Other Clinician: Referring Angeliyah Kirkey: Treating Darnell Stimson/Extender: Clifton Hobie in Treatment: 0 Fall Risk Assessment Items Have you had 2 or more falls in the last 12 monthso 0 Yes Have you had any fall that resulted in injury in the last 12 monthso 0 No FALLS RISK SCREEN History of falling - immediate or within 3 months 0 No Secondary diagnosis (Do you have 2 or more medical diagnoseso) 0 No Ambulatory aid None/bed rest/wheelchair/nurse 0 No Crutches/cane/walker 15 Yes Furniture 0 No Intravenous therapy Access/Saline/Heparin Lock 0 No Gait/Transferring Normal/ bed rest/ wheelchair 0 No Weak (short steps with  or without shuffle, stooped but able to lift head while walking, may seek 10 Yes support from furniture) Impaired (short steps with shuffle, may have difficulty arising from chair, head down, impaired 0 No balance) Mental Status Oriented to own ability 0 Yes Electronic Signature(s) Signed: 09/26/2020 5:39:50 PM By: Antonieta Iba Entered By: Antonieta Iba on 09/26/2020 15:02:43 -------------------------------------------------------------------------------- Foot Assessment Details Patient Name: Date of Service: Karl Belfast MES C. 09/26/2020 2:45 PM Medical Record Number: [id number] Patient Account Number: [id number] Date of Birth/Sex: Treating RN: [birth year] (87 y.o. Male) Antonieta Iba Primary Care Caedon Bond: Jarome Matin Other Clinician: Referring Hermes Wafer: Treating Matthe Sloane/Extender: Clifton Milburn in Treatment: 0 Foot Assessment Items Site Locations + = Sensation present, - = Sensation absent, C = Callus, U = Ulcer R = Redness, W = Warmth, M = Maceration, PU = Pre-ulcerative lesion F = Fissure, S = Swelling, D = Dryness Assessment Right: Left: Other Deformity: No No Prior Foot Ulcer: No No Prior Amputation: No No Charcot Joint: No  No Ambulatory Status: Ambulatory With Help Assistance Device: Walker Gait: Steady Electronic Signature(s) Signed: 09/26/2020 5:39:50 PM By: Antonieta Iba Entered By: Antonieta Iba on 09/26/2020 15:05:47 -------------------------------------------------------------------------------- Nutrition Risk Screening Details Patient Name: Date of Service: Karl ILIFF, [name] 09/26/2020 2:45 PM Medical Record Number: [id number] Patient Account Number: [id number] Date of Birth/Sex: Treating RN: 1926-06-17 (87 y.o. Male) Antonieta Iba Primary Care Leodan Bolyard: Jarome Matin Other Clinician: Referring Jordis Repetto: Treating Delcia Spitzley/Extender: Clifton Iyan in Treatment: 0 Height (in): 63 Weight (lbs): 136 Body Mass Index (BMI): 24.1 Nutrition Risk Screening Items Score Screening NUTRITION RISK SCREEN: I have an illness or condition that made me change the kind and/or amount of food I eat 0 No I eat fewer than two meals per day 0 No I eat few fruits and vegetables, or milk products 0 No I have three or more drinks of beer, liquor or wine almost every day 0 No I have tooth or mouth problems that make it hard for me to eat 0 No I don't always have enough money to buy the food I need 0 No I eat alone most of the time 0 No I take three or more different prescribed or over-the-counter drugs a day 1 Yes Without wanting to, I have lost or gained 10 pounds in the last six months 0 No I am not always physically able to shop, cook and/or feed myself 0 No Nutrition Protocols Good Risk Protocol 0 No interventions needed Moderate Risk Protocol High Risk Proctocol Risk Level: Good Risk Score: 1 Electronic Signature(s) Signed: 09/26/2020 5:39:50 PM By: Antonieta Iba Entered By: Antonieta Iba on 09/26/2020 15:04:03

## 2020-09-27 ENCOUNTER — Other Ambulatory Visit: Payer: Self-pay | Admitting: Internal Medicine

## 2020-09-27 ENCOUNTER — Other Ambulatory Visit (HOSPITAL_COMMUNITY): Payer: Self-pay | Admitting: Internal Medicine

## 2020-09-27 DIAGNOSIS — G9009 Other idiopathic peripheral autonomic neuropathy: Secondary | ICD-10-CM | POA: Diagnosis not present

## 2020-09-27 DIAGNOSIS — I5022 Chronic systolic (congestive) heart failure: Secondary | ICD-10-CM | POA: Diagnosis not present

## 2020-09-27 DIAGNOSIS — L97519 Non-pressure chronic ulcer of other part of right foot with unspecified severity: Secondary | ICD-10-CM | POA: Diagnosis not present

## 2020-09-27 DIAGNOSIS — I739 Peripheral vascular disease, unspecified: Secondary | ICD-10-CM | POA: Diagnosis not present

## 2020-09-30 DIAGNOSIS — I739 Peripheral vascular disease, unspecified: Secondary | ICD-10-CM | POA: Diagnosis not present

## 2020-09-30 DIAGNOSIS — L97519 Non-pressure chronic ulcer of other part of right foot with unspecified severity: Secondary | ICD-10-CM | POA: Diagnosis not present

## 2020-09-30 DIAGNOSIS — I5022 Chronic systolic (congestive) heart failure: Secondary | ICD-10-CM | POA: Diagnosis not present

## 2020-09-30 DIAGNOSIS — G9009 Other idiopathic peripheral autonomic neuropathy: Secondary | ICD-10-CM | POA: Diagnosis not present

## 2020-10-02 DIAGNOSIS — L98499 Non-pressure chronic ulcer of skin of other sites with unspecified severity: Secondary | ICD-10-CM | POA: Diagnosis not present

## 2020-10-02 DIAGNOSIS — L570 Actinic keratosis: Secondary | ICD-10-CM | POA: Diagnosis not present

## 2020-10-03 ENCOUNTER — Other Ambulatory Visit: Payer: Self-pay

## 2020-10-03 ENCOUNTER — Encounter (HOSPITAL_BASED_OUTPATIENT_CLINIC_OR_DEPARTMENT_OTHER): Payer: Medicare PPO | Admitting: Internal Medicine

## 2020-10-03 DIAGNOSIS — J449 Chronic obstructive pulmonary disease, unspecified: Secondary | ICD-10-CM | POA: Diagnosis not present

## 2020-10-03 DIAGNOSIS — I25119 Atherosclerotic heart disease of native coronary artery with unspecified angina pectoris: Secondary | ICD-10-CM | POA: Diagnosis not present

## 2020-10-03 DIAGNOSIS — I110 Hypertensive heart disease with heart failure: Secondary | ICD-10-CM | POA: Diagnosis not present

## 2020-10-03 DIAGNOSIS — L97512 Non-pressure chronic ulcer of other part of right foot with fat layer exposed: Secondary | ICD-10-CM | POA: Diagnosis not present

## 2020-10-03 DIAGNOSIS — G9009 Other idiopathic peripheral autonomic neuropathy: Secondary | ICD-10-CM | POA: Diagnosis not present

## 2020-10-03 DIAGNOSIS — L97519 Non-pressure chronic ulcer of other part of right foot with unspecified severity: Secondary | ICD-10-CM | POA: Diagnosis not present

## 2020-10-03 DIAGNOSIS — I739 Peripheral vascular disease, unspecified: Secondary | ICD-10-CM | POA: Diagnosis not present

## 2020-10-03 DIAGNOSIS — I252 Old myocardial infarction: Secondary | ICD-10-CM | POA: Diagnosis not present

## 2020-10-03 DIAGNOSIS — I429 Cardiomyopathy, unspecified: Secondary | ICD-10-CM | POA: Diagnosis not present

## 2020-10-03 DIAGNOSIS — I5022 Chronic systolic (congestive) heart failure: Secondary | ICD-10-CM | POA: Diagnosis not present

## 2020-10-03 NOTE — Progress Notes (Signed)
ERRON, WENGERT ([id number]) Visit Report for 10/03/2020 Chief Complaint Document Details Patient Name: Date of Service: [name] 10/03/2020 12:30 PM Medical Record Number: [id number] Patient Account Number: [id number] Date of Birth/Sex: Treating RN: 10/28/1926 (87 y.o. [name] Primary Care Provider: [name] Other Clinician: Referring Provider: Treating Provider/Extender: [name] in Treatment: 1 Information Obtained from: Patient Chief Complaint Right foot wound Electronic Signature(s) Signed: 10/03/2020 1:34:27 PM By: [name] DO Entered By: [name] on 10/03/2020 13:30:59 -------------------------------------------------------------------------------- Debridement Details Patient Name: Date of Service: [name], [name] 10/03/2020 12:30 PM Medical Record Number: [id number] Patient Account Number: [id number] Date of Birth/Sex: Treating RN: 30-Sep-1926 (87 y.o. [name], [name] Primary Care Provider: [name] Other Clinician: Referring Provider: Treating Provider/Extender: [name] in Treatment: 1 Debridement Performed for Assessment: Wound #1 Right,Medial Foot Performed By: Physician [name], DO Debridement Type: Debridement Level of Consciousness (Pre-procedure): Awake and Alert Pre-procedure Verification/Time Out Yes - 13:10 Taken: Start Time: 13:11 Pain Control: Lidocaine 4% T opical Solution T Area Debrided (L x W): otal 1 (cm) x 1 (cm) = 1 (cm) Tissue and other material debrided: Viable, Non-Viable, Callus, Slough, Subcutaneous, Skin: Dermis , Skin: Epidermis, Fibrin/Exudate, Slough Level: Skin/Subcutaneous Tissue Debridement Description: Excisional Instrument: Curette Bleeding: Minimum Hemostasis Achieved: Pressure End Time: 13:14 Procedural Pain: 0 Post Procedural Pain: 0 Response to Treatment: Procedure was tolerated well Level of  Consciousness (Post- Awake and Alert procedure): Post Debridement Measurements of Total Wound Length: (cm) 0.9 Width: (cm) 0.9 Depth: (cm) 0.4 Volume: (cm) 0.254 Character of Wound/Ulcer Post Debridement: Improved Post Procedure Diagnosis Same as Pre-procedure Electronic Signature(s) Signed: 10/03/2020 1:34:27 PM By: [name] DO Signed: 10/03/2020 6:02:39 PM By: Deon Pilling Entered By: Deon Pilling on 10/03/2020 13:14:32 -------------------------------------------------------------------------------- HPI Details Patient Name: Date of Service: [name], [name] 10/03/2020 12:30 PM Medical Record Number: [id number] Patient Account Number: [id number] Date of Birth/Sex: Treating RN: October 23, 1926 (87 y.o. [name] Primary Care Provider: [name] Other Clinician: Referring Provider: Treating Provider/Extender: [name] in Treatment: 1 History of Present Illness HPI Description: Mr. Lowry Bala is a 87 year old male with a past medical history of peripheral vascular disease, coronary artery disease, chronic systolic heart failure, and peripheral neuropathy that presents to our clinic for a 65-monthhistory of right foot wound. He thinks that the wound started by Rubbing against his shoes. He states he was evaluated by his primary care physician who obtained an x-ray of the right foot. They were sent to vein and vascular for further assessment of the blood flow to the lower extremities. He had an aortogram on 09/05/2020 and had a right posterior tibial artery angioplasty. He states that the wound has been stable in appearance and size since the procedure. He has been using Bactroban and covering the area with a Band-Aid. He is using soft bedroom shoes. He denies acute signs of infection. 5/26; patient presents for 1 week follow-up. He has been using collagen to the wound with dressing changes. He has no complaints and denies acute signs  of infection today. He is scheduled for an MRI on 6/2 Electronic Signature(s) Signed: 10/03/2020 1:34:27 PM By: HKalman ShanDO Entered By: HKalman Shanon 10/03/2020 13:31:35 -------------------------------------------------------------------------------- Physical Exam Details Patient Name: Date of Service: BMilford Cage [name] 10/03/2020 12:30 PM Medical Record Number: [id number] Account Number: [id number] of Birth/Sex: Treating RN: 1Dec 21, 1928(87y.o. MLorette Ang BTammi KlippelPrimary  Care Provider: [name] Other Clinician: Referring Provider: Treating Provider/Extender: [name] in Treatment: 1 Constitutional respirations regular, non-labored and within target range for patient.[name] Psychiatric pleasant and cooperative. Notes Right foot: Medial aspect with an open wound that appears to probe to bone. The tissue is pale in the wound bed. There is surrounding callus. There are no acute soft tissue signs of infection. The wound is over a bony prominence. Difficult to palpate pedal pulse Electronic Signature(s) Signed: 10/03/2020 1:34:27 PM By: [name] DO Entered By: [name] on 10/03/2020 13:32:13 -------------------------------------------------------------------------------- Physician Orders Details Patient Name: Date of Service: [name], [name] 10/03/2020 12:30 PM Medical Record Number: [id number] Patient Account Number: [id number] Date of Birth/Sex: Treating RN: [birth year] (87 y.o. [name], [name] Primary Care Provider: [name] Other Clinician: Referring Provider: Treating Provider/Extender: [name] in Treatment: 1 Verbal / Phone Orders: No Diagnosis Coding ICD-10 Coding Code Description L97.519 Non-pressure chronic ulcer of other part of right foot with unspecified severity I73.9 Peripheral vascular disease, unspecified G90.09 Other idiopathic peripheral  autonomic neuropathy Y77.41 Chronic systolic (congestive) heart failure I25.119 Atherosclerotic heart disease of native coronary artery with unspecified angina pectoris Follow-up Appointments ppointment in: - Monday 10/14/2020 Dr. Heber [place] Return A Bathing/ Shower/ Hygiene May shower and wash wound with soap and water. - with dressing changes only. Edema Control - Lymphedema / SCD / Other Elevate legs to the level of the heart or above for 30 minutes daily and/or when sitting, a frequency of: - 3-4 times throughout the day. Avoid standing for long periods of time. Moisturize legs daily. - every night before bed. Off-Loading Open toe surgical shoe to: - both feet wear while walking. Additional Orders / Instructions Other: - ****REMOVE ALL DRESSINGS FROM WOUND, [place] WITH SOAP AND WATER, AND COVER ONLY WITH A BORDERED FOAM ONLY THE MORNING OF MRI*** Wound Treatment Wound #1 - Foot Wound Laterality: Right, Medial Cleanser: Soap and Water Every Other Day/15 Days Discharge Instructions: May shower and wash wound with dial antibacterial soap and water prior to dressing change. Cleanser: Byram Ancillary Kit - 15 Day Supply (Generic) Every Other Day/15 Days Discharge Instructions: Use supplies as instructed; Kit contains: (15) Saline Bullets; (15) 3x3 Gauze; 15 pr Gloves Prim Dressing: Promogran Prisma Matrix, 4.34 (sq in) (silver collagen) (Generic) Every Other Day/15 Days ary Discharge Instructions: Moisten collagen with ***KY jelly.**** Secondary Dressing: Woven Gauze Sponges 2x2 in (Generic) Every Other Day/15 Days Discharge Instructions: Apply over primary dressing as directed. Secondary Dressing: Optifoam Non-Adhesive Dressing, 4x4 in (Generic) Every Other Day/15 Days Discharge Instructions: ***foam donut***Apply over primary dressing as directed. Secured With: Child psychotherapist, Sterile 2x75 (in/in) (Generic) Every Other Day/15 Days Discharge Instructions: Secure with stretch  gauze as directed. Secured With: 77M Medipore H Soft Cloth Surgical T 4 x 2 (in/yd) (Generic) Every Other Day/15 Days ape Discharge Instructions: Secure dressing with tape as directed. Notes MRI scheduled for Thursday 10/10/2020. Electronic Signature(s) Signed: 10/03/2020 1:34:27 PM By: [name] DO Signed: 10/03/2020 1:34:27 PM By: [name] DO Entered By: [name] on 10/03/2020 13:32:24 -------------------------------------------------------------------------------- Problem List Details Patient Name: Date of Service: [name], [name] 10/03/2020 12:30 PM Medical Record Number: [id number] Patient Account Number: [id number] Date of Birth/Sex: Treating RN: 1927/03/09 (87 y.o. [name] Primary Care Provider: [name] Other Clinician: Referring Provider: Treating Provider/Extender: [name] in Treatment: 1 Active Problems ICD-10 Encounter Code Description Active Date MDM Diagnosis L97.519 Non-pressure  chronic ulcer of other part of right foot with unspecified severity 09/26/2020 No Yes I73.9 Peripheral vascular disease, unspecified 09/26/2020 No Yes G90.09 Other idiopathic peripheral autonomic neuropathy 09/26/2020 No Yes E09.23 Chronic systolic (congestive) heart failure 09/26/2020 No Yes I25.119 Atherosclerotic heart disease of native coronary artery with unspecified angina 09/26/2020 No Yes pectoris Inactive Problems Resolved Problems Electronic Signature(s) Signed: 10/03/2020 1:34:27 PM By: [name] DO Entered By: [name] on 10/03/2020 13:30:43 -------------------------------------------------------------------------------- Progress Note Details Patient Name: Date of Service: [name], [name] 10/03/2020 12:30 PM Medical Record Number: [id number] Patient Account Number: [id number] Date of Birth/Sex: Treating RN: August 14, 1926 (87 y.o. [name] Primary Care Provider: [name] Other Clinician: Referring Provider: Treating Provider/Extender: [name] in Treatment: 1 Subjective Chief Complaint Information obtained from Patient Right foot wound History of Present Illness (HPI) Mr. Marten Iles is a 87 year old male with a past medical history of peripheral vascular disease, coronary artery disease, chronic systolic heart failure, and peripheral neuropathy that presents to our clinic for a 19-monthhistory of right foot wound. He thinks that the wound started by Rubbing against his shoes. He states he was evaluated by his primary care physician who obtained an x-ray of the right foot. They were sent to vein and vascular for further assessment of the blood flow to the lower extremities. He had an aortogram on 09/05/2020 and had a right posterior tibial artery angioplasty. He states that the wound has been stable in appearance and size since the procedure. He has been using Bactroban and covering the area with a Band-Aid. He is using soft bedroom shoes. He denies acute signs of infection. 5/26; patient presents for 1 week follow-up. He has been using collagen to the wound with dressing changes. He has no complaints and denies acute signs of infection today. He is scheduled for an MRI on 6/2 Patient History Information obtained from Patient. Family History Cancer - Mother, Diabetes - Child, Heart Disease - Father, Lung Disease - Siblings, No family history of Hereditary Spherocytosis, Hypertension, Kidney Disease, Seizures, Stroke, Thyroid Problems, Tuberculosis. Social History Never smoker, Marital Status - Married, Alcohol Use - Rarely, Drug Use - No History, Caffeine Use - Moderate. Medical History Eyes Patient has history of Cataracts Respiratory Patient has history of Chronic Obstructive Pulmonary Disease (COPD) Cardiovascular Patient has history of Arrhythmia, Congestive Heart Failure, Coronary Artery Disease,  Hypertension, Myocardial Infarction, Peripheral Venous Disease Gastrointestinal Patient has history of Crohnoos Medical A Surgical History Notes nd Cardiovascular cardiomyopathy, AAA repair Objective Constitutional respirations regular, non-labored and within target range for patient.. Vitals Time Taken: 12:45 PM, Height: 63 in, Weight: 136 lbs, BMI: 24.1, Temperature: 97.9 F, Pulse: 76 bpm, Respiratory Rate: 18 breaths/min, Blood Pressure: 117/71 mmHg. Psychiatric pleasant and cooperative. General Notes: Right foot: Medial aspect with an open wound that appears to probe to bone. The tissue is pale in the wound bed. There is surrounding callus. There are no acute soft tissue signs of infection. The wound is over a bony prominence. Difficult to palpate pedal pulse Integumentary (Hair, Skin) Wound #1 status is Open. Original cause of wound was Gradually Appeared. The date acquired was: 07/23/2020. The wound has been in treatment 1 weeks. The wound is located on the Right,Medial Foot. The wound measures 0.9cm length x 0.9cm width x 0.4cm depth; 0.636cm^2 area and 0.254cm^3 volume. There is Fat Layer (Subcutaneous Tissue) exposed. There is no tunneling or undermining noted. There is a medium amount of serosanguineous drainage noted. The  wound margin is well defined and not attached to the wound base. There is medium (34-66%) pink, pale granulation within the wound bed. There is a medium (34- 66%) amount of necrotic tissue within the wound bed including Adherent Slough. General Notes: Calloused Periwound Assessment Active Problems ICD-10 Non-pressure chronic ulcer of other part of right foot with unspecified severity Peripheral vascular disease, unspecified Other idiopathic peripheral autonomic neuropathy Chronic systolic (congestive) heart failure Atherosclerotic heart disease of native coronary artery with unspecified angina pectoris Patient's wound is overall stable. Some nonviable  tissue was debrided today. I can still probe to bone. He will have an MRI in 6/2 and I will see him the following Monday to discuss results. He shows no signs of acute or systemic infection. We will continue with Prisma with dressing changes. Procedures Wound #1 Pre-procedure diagnosis of Wound #1 is a Neuropathic Ulcer-Non Diabetic located on the Right,Medial Foot . There was a Excisional Skin/Subcutaneous Tissue Debridement with a total area of 1 sq cm performed by [name], DO. With the following instrument(s): Curette to remove Viable and Non-Viable tissue/material. Material removed includes Callus, Subcutaneous Tissue, Slough, Skin: Dermis, Skin: Epidermis, and Fibrin/Exudate after achieving pain control using Lidocaine 4% T opical Solution. A time out was conducted at 13:10, prior to the start of the procedure. A Minimum amount of bleeding was controlled with Pressure. The procedure was tolerated well with a pain level of 0 throughout and a pain level of 0 following the procedure. Post Debridement Measurements: 0.9cm length x 0.9cm width x 0.4cm depth; 0.254cm^3 volume. Character of Wound/Ulcer Post Debridement is improved. Post procedure Diagnosis Wound #1: Same as Pre-Procedure Plan Follow-up Appointments: Return Appointment in: - Monday 10/14/2020 Dr. Heber [place] Bathing/ Shower/ Hygiene: May shower and wash wound with soap and water. - with dressing changes only. Edema Control - Lymphedema / SCD / Other: Elevate legs to the level of the heart or above for 30 minutes daily and/or when sitting, a frequency of: - 3-4 times throughout the day. Avoid standing for long periods of time. Moisturize legs daily. - every night before bed. Off-Loading: Open toe surgical shoe to: - both feet wear while walking. Additional Orders / Instructions: Other: - ****REMOVE ALL DRESSINGS FROM WOUND, [place] WITH SOAP AND WATER, AND COVER ONLY WITH A BORDERED FOAM ONLY THE MORNING OF MRI*** General Notes:  MRI scheduled for Thursday 10/10/2020. WOUND #1: - Foot Wound Laterality: Right, Medial Cleanser: Soap and Water Every Other Day/15 Days Discharge Instructions: May shower and wash wound with dial antibacterial soap and water prior to dressing change. Cleanser: Byram Ancillary Kit - 15 Day Supply (Generic) Every Other Day/15 Days Discharge Instructions: Use supplies as instructed; Kit contains: (15) Saline Bullets; (15) 3x3 Gauze; 15 pr Gloves Prim Dressing: Promogran Prisma Matrix, 4.34 (sq in) (silver collagen) (Generic) Every Other Day/15 Days ary Discharge Instructions: Moisten collagen with ***KY jelly.**** Secondary Dressing: Woven Gauze Sponges 2x2 in (Generic) Every Other Day/15 Days Discharge Instructions: Apply over primary dressing as directed. Secondary Dressing: Optifoam Non-Adhesive Dressing, 4x4 in (Generic) Every Other Day/15 Days Discharge Instructions: ***foam donut***Apply over primary dressing as directed. Secured With: Child psychotherapist, Sterile 2x75 (in/in) (Generic) Every Other Day/15 Days Discharge Instructions: Secure with stretch gauze as directed. Secured With: 65M Medipore H Soft Cloth Surgical T 4 x 2 (in/yd) (Generic) Every Other Day/15 Days ape Discharge Instructions: Secure dressing with tape as directed. 1. In office sharp debridement 2. Prisma 3. Offloading felt pad 4. MRI on 6/2 and  follow-up with me on 6/6 Electronic Signature(s) Signed: 10/03/2020 1:34:27 PM By: [name] DO Entered By: [name] on 10/03/2020 13:33:58 -------------------------------------------------------------------------------- HxROS Details Patient Name: Date of Service: [name], [name] 10/03/2020 12:30 PM Medical Record Number: [id number] Patient Account Number: [id number] Date of Birth/Sex: Treating RN: 10-16-1926 (87 y.o. [name] Primary Care Provider: [name] Other Clinician: Referring Provider: Treating Provider/Extender:  [name] in Treatment: 1 Information Obtained From Patient Eyes Medical History: Positive for: Cataracts Respiratory Medical History: Positive for: Chronic Obstructive Pulmonary Disease (COPD) Cardiovascular Medical History: Positive for: Arrhythmia; Congestive Heart Failure; Coronary Artery Disease; Hypertension; Myocardial Infarction; Peripheral Venous Disease Past Medical History Notes: cardiomyopathy, AAA repair Gastrointestinal Medical History: Positive for: Crohns HBO Extended History Items Eyes: Cataracts Immunizations Pneumococcal Vaccine: Received Pneumococcal Vaccination: Yes Implantable Devices None Family and Social History Cancer: Yes - Mother; Diabetes: Yes - Child; Heart Disease: Yes - Father; Hereditary Spherocytosis: No; Hypertension: No; Kidney Disease: No; Lung Disease: Yes - Siblings; Seizures: No; Stroke: No; Thyroid Problems: No; Tuberculosis: No; Never smoker; Marital Status - Married; Alcohol Use: Rarely; Drug Use: No History; Caffeine Use: Moderate; Financial Concerns: No; Food, Clothing or Shelter Needs: No; Support System Lacking: No; Transportation Concerns: No Electronic Signature(s) Signed: 10/03/2020 1:34:27 PM By: [name] DO Signed: 10/03/2020 6:02:39 PM By: Deon Pilling Entered By: [name] on 10/03/2020 13:31:41 -------------------------------------------------------------------------------- SuperBill Details Patient Name: Date of Service: [name], [name] 10/03/2020 Medical Record Number: [id number] Patient Account Number: [id number] Date of Birth/Sex: Treating RN: Feb 19, 1927 (87 y.o. [name], [name] Primary Care Provider: [name] Other Clinician: Referring Provider: Treating Provider/Extender: [name] in Treatment: 1 Diagnosis Coding ICD-10 Codes Code Description [phone] Non-pressure chronic ulcer of other part of right foot with  unspecified severity I73.9 Peripheral vascular disease, unspecified G90.09 Other idiopathic peripheral autonomic neuropathy O17.51 Chronic systolic (congestive) heart failure I25.119 Atherosclerotic heart disease of native coronary artery with unspecified angina pectoris Facility Procedures CPT4 Code: [id number] Description: 82423 - DEB SUBQ TISSUE 20 SQ CM/< ICD-10 Diagnosis Description L97.519 Non-pressure chronic ulcer of other part of right foot with unspecified severi Modifier: ty Quantity: 1 Physician Procedures : CPT4 Code Description Modifier [id number] - WC PHYS SUBQ TISS 20 SQ CM ICD-10 Diagnosis Description L97.519 Non-pressure chronic ulcer of other part of right foot with unspecified severity Quantity: 1 Electronic Signature(s) Signed: 10/03/2020 1:34:27 PM By: [name] DO Entered By: [name] on 10/03/2020 13:34:03

## 2020-10-03 NOTE — Progress Notes (Signed)
Karl Stevenson, Karl Stevenson ([id number]) Visit Report for 10/03/2020 Arrival Information Details Patient Name: Date of Service: Karl Stevenson MES C. 10/03/2020 12:30 PM Medical Record Number: [id number] Patient Account Number: [id number] Date of Birth/Sex: Treating RN: [birth year] (87 y.o. [name] Primary Care Emerson Schreifels: [name] Other Clinician: Referring Hewitt Garner: Treating Nahomi Hegner/Extender: [name] in Treatment: 1 Visit Information History Since Last Visit Added or deleted any medications: No Patient Arrived: [name] Any new allergies or adverse reactions: No Arrival Time: 12:42 Had a fall or experienced change in No Accompanied By: wife activities of daily living that may affect Transfer Assistance: None risk of falls: Patient Requires Transmission-Based Precautions: No Signs or symptoms of abuse/neglect since No Patient Has Alerts: Yes last visito Patient Alerts: Patient on Blood Thinner Hospitalized since last visit: No ABI R=1.17 L=0.87 Implantable device outside of the clinic No TBI R=0.51 L=0.55 excluding cellular tissue based products placed in the center since last visit: Has Dressing in Place as Prescribed: Yes Has Footwear/Offloading in Place as Yes Prescribed: Left: Surgical Shoe with Pressure Relief Insole Right: Surgical Shoe with Pressure Relief Insole Pain Present Now: No Electronic Signature(s) Signed: 10/03/2020 5:35:44 PM By: Lorrin Jackson Entered By: Lorrin Jackson on 10/03/2020 12:46:24 -------------------------------------------------------------------------------- Encounter Discharge Information Details Patient Name: Date of Service: Karl Stevenson, [name] 10/03/2020 12:30 PM Medical Record Number: [id number] Patient Account Number: [id number] Date of Birth/Sex: Treating RN: 10-23-1926 (87 y.o. [name] Primary Care Anthoney Sheppard: [name] Other Clinician: Referring Lisel Siegrist: Treating  Treysean Petruzzi/Extender: [name] in Treatment: 1 Encounter Discharge Information Items Post Procedure Vitals Discharge Condition: Stable Temperature (F): 97.9 Ambulatory Status: Walker Pulse (bpm): 76 Discharge Destination: Home Respiratory Rate (breaths/min): 18 Transportation: Private Auto Blood Pressure (mmHg): 117/71 Accompanied By: spouse Schedule Follow-up Appointment: Yes Clinical Summary of Care: Patient Declined Electronic Signature(s) Signed: 10/03/2020 5:45:23 PM By: Baruch Gouty RN, BSN Entered By: Baruch Gouty on 10/03/2020 13:27:56 -------------------------------------------------------------------------------- Lower Extremity Assessment Details Patient Name: Date of Service: Karl Stevenson, [name] 10/03/2020 12:30 PM Medical Record Number: [id number] Patient Account Number: [id number] Date of Birth/Sex: Treating RN: 09/14/1926 (87 y.o. [name] Primary Care Oliva Montecalvo: [name] Other Clinician: Referring Charnette Younkin: Treating Juandiego Kolenovic/Extender: [name] in Treatment: 1 Edema Assessment Assessed: [name]: No] [name]: Yes] Edema: [Left: Ye] [Right: s] Calf Left: Right: Point of Measurement: 29 cm From Medial Instep 30 cm Ankle Left: Right: Point of Measurement: 8 cm From Medial Instep 21 cm Vascular Assessment Pulses: Dorsalis Pedis Palpable: [Right:No Yes] Electronic Signature(s) Signed: 10/03/2020 5:35:44 PM By: Lorrin Jackson Entered By: Lorrin Jackson on 10/03/2020 12:52:13 -------------------------------------------------------------------------------- Multi Wound Chart Details Patient Name: Date of Service: Karl Stevenson MES C. 10/03/2020 12:30 PM Medical Record Number: [id number] Patient Account Number: [id number] Date of Birth/Sex: Treating RN: 07-12-1926 (87 y.o. [name], Tammi Klippel Primary Care Telina Kleckley: [name] Other Clinician: Referring Deric Bocock: Treating  Seara Hinesley/Extender: [name] in Treatment: 1 Vital Signs Height(in): 43 Pulse(bpm): 63 Weight(lbs): 136 Blood Pressure(mmHg): 117/71 Body Mass Index(BMI): 24 Temperature(F): 97.9 Respiratory Rate(breaths/min): 18 Photos: [1:No Photos Right, Medial Foot] [N/A:N/A N/A] Wound Location: [1:Gradually Appeared] [N/A:N/A] Wounding Event: [1:Neuropathic Ulcer-Non Diabetic] [N/A:N/A] Primary Etiology: [1:Cataracts, Chronic Obstructive] [N/A:N/A] Comorbid History: [1:Pulmonary Disease (COPD), Arrhythmia, Congestive Heart Failure, Coronary Artery Disease, Hypertension, Myocardial Infarction, Peripheral Venous Disease, Crohns 07/23/2020] [N/A:N/A] Date Acquired: [1:1] [N/A:N/A] Weeks of Treatment: [1:Open] [N/A:N/A] Wound Status: [1:0.9x0.9x0.4] [N/A:N/A] Measurements L x W x D (cm) [1:0.636] [N/A:N/A] A (cm) :  rea [1:0.254] [N/A:N/A] Volume (cm) : [1:46.00%] [N/A:N/A] % Reduction in A [1:rea: 46.10%] [N/A:N/A] % Reduction in Volume: [1:Full Thickness Without Exposed] [N/A:N/A] Classification: [1:Support Structures Medium] [N/A:N/A] Exudate A mount: [1:Serosanguineous] [N/A:N/A] Exudate Type: [1:red, brown] [N/A:N/A] Exudate Color: [1:Well defined, not attached] [N/A:N/A] Wound Margin: [1:Medium (34-66%)] [N/A:N/A] Granulation A mount: [1:Pink, Pale] [N/A:N/A] Granulation Quality: [1:Medium (34-66%)] [N/A:N/A] Necrotic A mount: [1:Fat Layer (Subcutaneous Tissue): Yes N/A] Exposed Structures: [1:Fascia: No Tendon: No Muscle: No Joint: No Bone: No Small (1-33%)] [N/A:N/A] Epithelialization: [1:Debridement - Excisional] [N/A:N/A] Debridement: Pre-procedure Verification/Time Out 13:10 [N/A:N/A] Taken: [1:Lidocaine 4% Topical Solution] [N/A:N/A] Pain Control: [1:Callus, Subcutaneous, Slough] [N/A:N/A] Tissue Debrided: [1:Skin/Subcutaneous Tissue] [N/A:N/A] Level: [1:1] [N/A:N/A] Debridement A (sq cm): [1:rea Curette] [N/A:N/A] Instrument: [1:Minimum]  [N/A:N/A] Bleeding: [1:Pressure] [N/A:N/A] Hemostasis A chieved: [1:0] [N/A:N/A] Procedural Pain: [1:0] [N/A:N/A] Post Procedural Pain: [1:Procedure was tolerated well] [N/A:N/A] Debridement Treatment Response: [1:0.9x0.9x0.4] [N/A:N/A] Post Debridement Measurements L x W x D (cm) [1:0.254] [N/A:N/A] Post Debridement Volume: (cm) [1:Calloused Periwound] [N/A:N/A] Assessment Notes: [1:Debridement] [N/A:N/A] Treatment Notes Wound #1 (Foot) Wound Laterality: Right, Medial Cleanser Byram Ancillary Kit - 15 Day Supply Discharge Instruction: Use supplies as instructed; Kit contains: (15) Saline Bullets; (15) 3x3 Gauze; 15 pr Gloves Soap and Water Discharge Instruction: May shower and wash wound with dial antibacterial soap and water prior to dressing change. Peri-Wound Care Topical Primary Dressing Promogran Prisma Matrix, 4.34 (sq in) (silver collagen) Discharge Instruction: Moisten collagen with ***KY jelly.**** Secondary Dressing Woven Gauze Sponges 2x2 in Discharge Instruction: Apply over primary dressing as directed. Optifoam Non-Adhesive Dressing, 4x4 in Discharge Instruction: ***foam donut***Apply over primary dressing as directed. Secured With Conforming Stretch Gauze Bandage, Sterile 2x75 (in/in) Discharge Instruction: Secure with stretch gauze as directed. 66M Medipore H Soft Cloth Surgical T 4 x 2 (in/yd) ape Discharge Instruction: Secure dressing with tape as directed. Compression Wrap Compression Stockings Add-Ons Electronic Signature(s) Signed: 10/03/2020 1:34:27 PM By: [name] DO Signed: 10/03/2020 6:02:39 PM By: Deon Pilling Entered By: [name] on 10/03/2020 13:30:48 -------------------------------------------------------------------------------- Multi-Disciplinary Care Plan Details Patient Name: Date of Service: Karl Stevenson, [name] 10/03/2020 12:30 PM Medical Record Number: [id number] Patient Account Number: [id number] Date of  Birth/Sex: Treating RN: January 18, 1927 (87 y.o. [name], [name] Primary Care Brookelin Felber: [name] Other Clinician: Referring Journee Kohen: Treating Jenica Costilow/Extender: [name] in Treatment: 1 Active Inactive Pain, Acute or Chronic Nursing Diagnoses: Pain, acute or chronic: actual or potential Potential alteration in comfort, pain Goals: Patient will verbalize adequate pain control and receive pain control interventions during procedures as needed Date Initiated: 09/26/2020 Target Resolution Date: 10/11/2020 Goal Status: Active Patient/caregiver will verbalize comfort level met Date Initiated: 09/26/2020 Target Resolution Date: 10/11/2020 Goal Status: Active Interventions: Encourage patient to take pain medications as prescribed Provide education on pain management Reposition patient for comfort Treatment Activities: Administer pain control measures as ordered : 09/26/2020 Notes: Wound/Skin Impairment Nursing Diagnoses: Knowledge deficit related to ulceration/compromised skin integrity Goals: Patient/caregiver will verbalize understanding of skin care regimen Date Initiated: 09/26/2020 Target Resolution Date: 10/11/2020 Goal Status: Active Interventions: Assess patient/caregiver ability to obtain necessary supplies Assess patient/caregiver ability to perform ulcer/skin care regimen upon admission and as needed Provide education on ulcer and skin care Treatment Activities: Skin care regimen initiated : 09/26/2020 Topical wound management initiated : 09/26/2020 Notes: Electronic Signature(s) Signed: 10/03/2020 6:02:39 PM By: Deon Pilling Entered By: Deon Pilling on 10/03/2020 13:00:53 -------------------------------------------------------------------------------- Pain Assessment Details Patient Name: Date of Service: Karl Stevenson, [name] 10/03/2020 12:30 PM Medical Record  Number: [id number] Patient Account Number: [id number] Date of  Birth/Sex: Treating RN: 06/29/1926 (87 y.o. [name] Primary Care Yunique Dearcos: [name] Other Clinician: Referring Ilay Capshaw: Treating Zebbie Ace/Extender: [name] in Treatment: 1 Active Problems Location of Pain Severity and Description of Pain Patient Has Paino No Site Locations Pain Management and Medication Current Pain Management: Electronic Signature(s) Signed: 10/03/2020 5:35:44 PM By: Lorrin Jackson Entered By: Lorrin Jackson on 10/03/2020 12:47:35 -------------------------------------------------------------------------------- Patient/Caregiver Education Details Patient Name: Date of Service: Karl Stevenson MES C. 5/26/2022andnbsp12:30 PM Medical Record Number: [id number] Patient Account Number: [id number] Date of Birth/Gender: Treating RN: 1927/04/15 (87 y.o. [name] Primary Care Physician: [name] Other Clinician: Referring Physician: Treating Physician/Extender: [name] in Treatment: 1 Education Assessment Education Provided To: Patient Education Topics Provided Wound/Skin Impairment: Handouts: Skin Care Do's and Dont's Methods: Explain/Verbal Responses: Reinforcements needed Electronic Signature(s) Signed: 10/03/2020 6:02:39 PM By: Deon Pilling Entered By: Deon Pilling on 10/03/2020 13:01:11 -------------------------------------------------------------------------------- Wound Assessment Details Patient Name: Date of Service: Karl Stevenson MES C. 10/03/2020 12:30 PM Medical Record Number: [id number] Patient Account Number: [id number] Date of Birth/Sex: Treating RN: 16-Jun-1926 (87 y.o. [name] Primary Care Nolan Lasser: [name] Other Clinician: Referring Ramonda Galyon: Treating Ramona Ruark/Extender: [name] in Treatment: 1 Wound Status Wound Number: 1 Primary Neuropathic Ulcer-Non Diabetic Etiology: Wound Location:  Right, Medial Foot Wound Open Wounding Event: Gradually Appeared Status: Date Acquired: 07/23/2020 Comorbid Cataracts, Chronic Obstructive Pulmonary Disease (COPD), Weeks Of Treatment: 1 History: Arrhythmia, Congestive Heart Failure, Coronary Artery Disease, Clustered Wound: No Hypertension, Myocardial Infarction, Peripheral Venous Disease, Crohns Photos Wound Measurements Length: (cm) 0.9 Width: (cm) 0.9 Depth: (cm) 0.4 Area: (cm) 0.636 Volume: (cm) 0.254 % Reduction in Area: 46% % Reduction in Volume: 46.1% Epithelialization: Small (1-33%) Tunneling: No Undermining: No Wound Description Classification: Full Thickness Without Exposed Support Structures Wound Margin: Well defined, not attached Exudate Amount: Medium Exudate Type: Serosanguineous Exudate Color: red, brown Foul Odor After Cleansing: No Slough/Fibrino Yes Wound Bed Granulation Amount: Medium (34-66%) Exposed Structure Granulation Quality: Pink, Pale Fascia Exposed: No Necrotic Amount: Medium (34-66%) Fat Layer (Subcutaneous Tissue) Exposed: Yes Necrotic Quality: Adherent Slough Tendon Exposed: No Muscle Exposed: No Joint Exposed: No Bone Exposed: No Assessment Notes Calloused Periwound Treatment Notes Wound #1 (Foot) Wound Laterality: Right, Medial Cleanser Byram Ancillary Kit - 15 Day Supply Discharge Instruction: Use supplies as instructed; Kit contains: (15) Saline Bullets; (15) 3x3 Gauze; 15 pr Gloves Soap and Water Discharge Instruction: May shower and wash wound with dial antibacterial soap and water prior to dressing change. Peri-Wound Care Topical Primary Dressing Promogran Prisma Matrix, 4.34 (sq in) (silver collagen) Discharge Instruction: Moisten collagen with ***KY jelly.**** Secondary Dressing Woven Gauze Sponges 2x2 in Discharge Instruction: Apply over primary dressing as directed. Optifoam Non-Adhesive Dressing, 4x4 in Discharge Instruction: ***foam donut***Apply over primary  dressing as directed. Secured With Conforming Stretch Gauze Bandage, Sterile 2x75 (in/in) Discharge Instruction: Secure with stretch gauze as directed. 36M Medipore H Soft Cloth Surgical T 4 x 2 (in/yd) ape Discharge Instruction: Secure dressing with tape as directed. Compression Wrap Compression Stockings Add-Ons Electronic Signature(s) Signed: 10/03/2020 4:57:49 PM By: Sandre Kitty Signed: 10/03/2020 5:35:44 PM By: Lorrin Jackson Entered By: Sandre Kitty on 10/03/2020 16:43:26 -------------------------------------------------------------------------------- Vitals Details Patient Name: Date of Service: Karl Stevenson, [name] 10/03/2020 12:30 PM Medical Record Number: [id number] Patient Account Number: [id number] Date of Birth/Sex: Treating RN: 01-01-1927 (87 y.o. [name] Primary Care Zubayr Bednarczyk: [name] Other  Clinician: Referring Athanasia Stanwood: Treating Jaylyn Iyer/Extender: [name] in Treatment: 1 Vital Signs Time Taken: 12:45 Temperature (F): 97.9 Height (in): 63 Pulse (bpm): 76 Weight (lbs): 136 Respiratory Rate (breaths/min): 18 Body Mass Index (BMI): 24.1 Blood Pressure (mmHg): 117/71 Reference Range: 80 - 120 mg / dl Electronic Signature(s) Signed: 10/03/2020 5:35:44 PM By: Lorrin Jackson Entered By: Lorrin Jackson on 10/03/2020 12:46:50

## 2020-10-08 ENCOUNTER — Encounter (HOSPITAL_COMMUNITY): Payer: Medicare PPO

## 2020-10-09 NOTE — Progress Notes (Signed)
Karl Stevenson ([id number]) Visit Report for 09/26/2020 Chief Complaint Document Details Patient Name: Date of Service: Karl Stevenson MES C. 09/26/2020 2:45 PM Medical Record Number: [id number] Patient Account Number: [id number] Date of Birth/Sex: Treating RN: 14-Dec-1926 (87 y.o. Karl Stevenson Primary Care Provider: Leanna Stevenson Other Clinician: Referring Provider: Treating Provider/Extender: Karl Stevenson in Treatment: 0 Information Obtained from: Patient Chief Complaint Right foot wound Electronic Signature(s) Signed: 09/26/2020 4:30:02 PM By: Karl Shan DO Entered By: Karl Stevenson on Stevenson 16:11:58 -------------------------------------------------------------------------------- Debridement Details Patient Name: Date of Service: Karl Stevenson, Karl MES C. 09/26/2020 2:45 PM Medical Record Number: [id number] Patient Account Number: [id number] Date of Birth/Sex: Treating RN: 11/03/1926 (87 y.o. Karl Stevenson Primary Care Provider: Leanna Stevenson Other Clinician: Referring Provider: Treating Provider/Extender: Karl Stevenson in Treatment: 0 Debridement Performed for Assessment: Wound #1 Right,Medial Foot Performed By: Physician Karl Shan, DO Debridement Type: Chemical/Enzymatic/Mechanical Agent Used: gauze and wound cleanser Level of Consciousness (Pre-procedure): Awake and Alert Pre-procedure Verification/Time Out No Taken: Bleeding: None Response to Treatment: Procedure was tolerated well Level of Consciousness (Post- Awake and Alert procedure): Post Debridement Measurements of Total Wound Length: (cm) 1.5 Width: (cm) 1 Depth: (cm) 0.4 Volume: (cm) 0.471 Character of Wound/Ulcer Post Debridement: Requires Further Debridement Post Procedure Diagnosis Same as Pre-procedure Electronic Signature(s) Signed: 09/26/2020 4:30:02 PM By: Karl Shan DO Signed: 09/26/2020 6:00:49 PM By: Karl Stevenson Entered By: Karl Stevenson 15:41:47 -------------------------------------------------------------------------------- HPI Details Patient Name: Date of Service: Karl Stevenson, Karl MES C. 09/26/2020 2:45 PM Medical Record Number: [id number] Patient Account Number: [id number] Date of Birth/Sex: Treating RN: 1926/07/08 (87 y.o. Karl Stevenson Primary Care Provider: Leanna Stevenson Other Clinician: Referring Provider: Treating Provider/Extender: Karl Stevenson in Treatment: 0 History of Present Illness HPI Description: Karl Stevenson is a 87 year old male with a past medical history of peripheral vascular disease, coronary artery disease, chronic systolic heart failure, and peripheral neuropathy that presents to our clinic for a 93-monthhistory of right foot wound. He thinks that the wound started by Rubbing against his shoes. He states he was evaluated by his primary care physician who obtained an x-ray of the right foot. They were sent to vein and vascular for further assessment of the blood flow to the lower extremities. He had an aortogram on 09/05/2020 and had a right posterior tibial artery angioplasty. He states that the wound has been stable in appearance and size since the procedure. He has been using Bactroban and covering the area with a Band-Aid. He is using soft bedroom shoes. He denies acute signs of infection. Electronic Signature(s) Signed: 09/26/2020 4:30:02 PM By: HKalman ShanDO Entered By: HKalman Shanon Stevenson 16:18:29 -------------------------------------------------------------------------------- Physical Exam Details Patient Name: Date of Service: Karl Stevenson, Karl MES C. 09/26/2020 2:45 PM Medical Record Number: [id number] Account Number: [id number] of Birth/Sex: Treating RN: [birth year](87y.o. MHessie DienerPrimary Care Provider: PLeanna BattlesOther Clinician: Referring Provider: Treating  Provider/Extender: [name] Treatment: 0 Constitutional respirations regular, non-labored and within target range for patient..Marland KitchenPsychiatric pleasant and cooperative. Notes Right foot: Medial aspect with an open wound that appears to probe to bone. The tissue is pale in the wound bed. There is surrounding callus. There are no acute soft tissue signs of infection. The wound is over a bony prominence. Difficult to palpate pedal pulse Electronic Signature(s) Signed: 09/26/2020 4:30:02 PM By: HKalman ShanDO Entered By: HKalman Stevenson  on Stevenson 16:18:35 -------------------------------------------------------------------------------- Physician Orders Details Patient Name: Date of Service: Karl Stevenson MES C. 09/26/2020 2:45 PM Medical Record Number: [id number] Patient Account Number: [id number] Date of Birth/Sex: Treating RN: 1926-12-29 (87 y.o. Karl Stevenson, [name] Primary Care Provider: Leanna Stevenson Other Clinician: Referring Provider: Treating Provider/Extender: Karl Stevenson in Treatment: 0 Verbal / Phone Orders: No Diagnosis Coding ICD-10 Coding Code Description L97.519 Non-pressure chronic ulcer of other part of right foot with unspecified severity I73.9 Peripheral vascular disease, unspecified G90.09 Other idiopathic peripheral autonomic neuropathy C78.93 Chronic systolic (congestive) heart failure I25.119 Atherosclerotic heart disease of native coronary artery with unspecified angina pectoris Follow-up Appointments ppointment in 1 week. - Thursday Dr Karl Stevenson Return A Bathing/ Shower/ Hygiene May shower and wash wound with soap and water. - with dressing changes only. Edema Control - Lymphedema / SCD / Other Elevate legs to the level of the heart or above for 30 minutes daily and/or when sitting, a frequency of: - 3-4 times throughout the day. Avoid standing for long periods of time. Moisturize legs daily.  - every night before bed. Off-Loading Open toe surgical shoe to: - ***clinic to provide patient with surgical shoe to both feet today.*** Additional Orders / Instructions Other: - Karl Stevenson will call primary care provider for x-ray results of right foot. Wound Treatment Wound #1 - Foot Wound Laterality: Right, Medial Cleanser: Soap and Water Every Other Day/15 Days Discharge Instructions: May shower and wash wound with dial antibacterial soap and water prior to dressing change. Cleanser: Byram Ancillary Kit - 15 Day Supply (DME) (Generic) Every Other Day/15 Days Discharge Instructions: Use supplies as instructed; Kit contains: (15) Saline Bullets; (15) 3x3 Gauze; 15 pr Gloves Prim Dressing: Promogran Prisma Matrix, 4.34 (sq in) (silver collagen) (DME) (Generic) Every Other Day/15 Days ary Discharge Instructions: Moisten collagen with ***KY jelly.**** Secondary Dressing: Woven Gauze Sponges 2x2 in (DME) (Generic) Every Other Day/15 Days Discharge Instructions: Apply over primary dressing as directed. Secondary Dressing: Optifoam Non-Adhesive Dressing, 4x4 in (DME) (Generic) Every Other Day/15 Days Discharge Instructions: ***foam donut***Apply over primary dressing as directed. Secured With: Child psychotherapist, Sterile 2x75 (in/in) (DME) (Generic) Every Other Day/15 Days Discharge Instructions: Secure with stretch gauze as directed. Secured With: 73M Medipore Karl Soft Cloth Surgical T 4 x 2 (in/yd) (DME) (Generic) Every Other Day/15 Days ape Discharge Instructions: Secure dressing with tape as directed. Radiology MRI, right foot with and without contrast - MRI with and without contrast of right foot related to non-healing neuropathic ulcer looking for infection. history of x-ray of right foot in 08/2020 by primary care provider. CPT code [phone] - (ICD10 L97.519 - Non-pressure chronic ulcer of other part of right foot with unspecified severity) Electronic Signature(s) Signed:  09/26/2020 6:00:49 PM By: Karl Stevenson Signed: 10/09/2020 9:55:24 AM By: Karl Shan DO Previous Signature: 09/26/2020 4:30:02 PM Version By: Karl Shan DO Entered By: Karl Stevenson 16:54:32 Prescription 09/26/2020 -------------------------------------------------------------------------------- Puller, Artyom C. Karl Shan DO Patient Name: Provider: 1927/03/13 [id number] Date of Birth: NPI#: Jerilynn Mages [id number] Sex: DEA #: [id number] Phone #: License #: [place] Patient Address: [address], [place] [address] [place], [place] [address] [phone] Allergies Novocain; amoxicillin; metoprolol; procaine Provider's Orders MRI, right foot with and without contrast - ICD10: L97.519 - MRI with and without contrast of right foot related to non-healing neuropathic ulcer looking for infection. history of x-ray of right foot in 08/2020  by primary care provider. CPT code [phone] Hand Signature: Date(s): Electronic Signature(s) Signed: 09/26/2020 6:00:49 PM By: Karl Stevenson Signed: 10/09/2020 9:55:24 AM By: Karl Shan DO Entered By: Karl Stevenson 16:54:32 -------------------------------------------------------------------------------- Problem List Details Patient Name: Date of Service: Karl Stevenson, Karl MES C. 09/26/2020 2:45 PM Medical Record Number: [id number] Patient Account Number: [id number] Date of Birth/Sex: Treating RN: 18-Nov-1926 (87 y.o. Karl Stevenson Primary Care Provider: Leanna Stevenson Other Clinician: Referring Provider: Treating Provider/Extender: Karl Stevenson in Treatment: 0 Active Problems ICD-10 Encounter Code Description Active Date MDM Diagnosis L97.519 Non-pressure chronic ulcer of other part of right foot with unspecified severity 09/26/2020 No Yes I73.9 Peripheral vascular disease, unspecified 09/26/2020 No  Yes G90.09 Other idiopathic peripheral autonomic neuropathy 09/26/2020 No Yes B28.41 Chronic systolic (congestive) heart failure 09/26/2020 No Yes I25.119 Atherosclerotic heart disease of native coronary artery with unspecified angina 09/26/2020 No Yes pectoris Inactive Problems Resolved Problems Electronic Signature(s) Signed: 09/26/2020 4:30:02 PM By: Karl Shan DO Entered By: Karl Stevenson on Stevenson 16:19:17 -------------------------------------------------------------------------------- Progress Note Details Patient Name: Date of Service: Karl Stevenson, Karl MES C. 09/26/2020 2:45 PM Medical Record Number: [id number] Patient Account Number: [id number] Date of Birth/Sex: Treating RN: July 29, 1926 (87 y.o. Karl Stevenson Primary Care Provider: Leanna Stevenson Other Clinician: Referring Provider: Treating Provider/Extender: Karl Stevenson in Treatment: 0 Subjective Chief Complaint Information obtained from Patient Right foot wound History of Present Illness (HPI) Mr. Malichi Palardy is a 87 year old male with a past medical history of peripheral vascular disease, coronary artery disease, chronic systolic heart failure, and peripheral neuropathy that presents to our clinic for a 58-monthhistory of right foot wound. He thinks that the wound started by Rubbing against his shoes. He states he was evaluated by his primary care physician who obtained an x-ray of the right foot. They were sent to vein and vascular for further assessment of the blood flow to the lower extremities. He had an aortogram on 09/05/2020 and had a right posterior tibial artery angioplasty. He states that the wound has been stable in appearance and size since the procedure. He has been using Bactroban and covering the area with a Band-Aid. He is using soft bedroom shoes. He denies acute signs of infection. Patient History Information obtained from Patient. Allergies Novocain (Reaction:  cold sweats, headache), amoxicillin (Reaction: diarrhea), metoprolol (Reaction: hives), procaine Family History Cancer - Mother, Diabetes - Child, Heart Disease - Father, Lung Disease - Siblings, No family history of Hereditary Spherocytosis, Hypertension, Kidney Disease, Seizures, Stroke, Thyroid Problems, Tuberculosis. Social History Never smoker, Marital Status - Married, Alcohol Use - Rarely, Drug Use - No History, Caffeine Use - Moderate. Medical History Eyes Patient has history of Cataracts Respiratory Patient has history of Chronic Obstructive Pulmonary Disease (COPD) Cardiovascular Patient has history of Arrhythmia, Congestive Heart Failure, Coronary Artery Disease, Hypertension, Myocardial Infarction, Peripheral Venous Disease Gastrointestinal Patient has history of Crohnoos Medical A Surgical History Notes nd Cardiovascular cardiomyopathy, AAA repair Review of Systems (ROS) Eyes Complains or has symptoms of Glasses / Contacts. Ear/Nose/Mouth/Throat Denies complaints or symptoms of Chronic sinus problems or rhinitis. Endocrine Denies complaints or symptoms of Heat/cold intolerance. Genitourinary Denies complaints or symptoms of Frequent urination. Integumentary (Skin) Complains or has symptoms of Wounds. Musculoskeletal Denies complaints or symptoms of Muscle Pain, Muscle Weakness. Neurologic Denies complaints or symptoms of Numbness/parasthesias. Psychiatric Denies complaints or symptoms of Claustrophobia, Suicidal. Objective Constitutional respirations regular, non-labored and within target range for patient.. Vitals Time Taken: 2:53 PM, Height:  63 in, Source: Stated, Weight: 136 lbs, Source: Stated, BMI: 24.1, Temperature: 97.5 F, Pulse: 76 bpm, Respiratory Rate: 18 breaths/min, Blood Pressure: 144/82 mmHg. Psychiatric pleasant and cooperative. General Notes: Right foot: Medial aspect with an open wound that appears to probe to bone. The tissue is pale in the  wound bed. There is surrounding callus. There are no acute soft tissue signs of infection. The wound is over a bony prominence. Difficult to palpate pedal pulse Integumentary (Hair, Skin) Wound #1 status is Open. Original cause of wound was Gradually Appeared. The date acquired was: 07/23/2020. The wound is located on the Right,Medial Foot. The wound measures 1.5cm length x 1cm width x 0.4cm depth; 1.178cm^2 area and 0.471cm^3 volume. There is Fat Layer (Subcutaneous Tissue) exposed. There is no tunneling or undermining noted. There is a medium amount of serosanguineous drainage noted. The wound margin is well defined and not attached to the wound base. There is medium (34-66%) red granulation within the wound bed. There is a medium (34-66%) amount of necrotic tissue within the wound bed including Adherent Slough. 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 He had revascularization of his right posterior tibial artery and current ABIs done on 5/17 on the right side are 1.17 and TBI of 0.51. At this time he may have enough blood flow to heal the wound. However, on exam I think I was able to probe to bone. He had an x-ray done on 4/6 by his primary care physician however I cannot see these results. Per patient x-ray results were negative for osteomyelitis. We will call the clinic to have these sent over. At this time I will order an MRI. The tissue looks nonviable and pale. I will try silver collagen at this time daily. If MRI does not show signs of osteomyelitis I will be more  aggressive with wound care. Currently there are no acute signs of infection to the surrounding tissue. The wound is over a bony prominence and was likely caused by pressure. I recommended he use surgical shoes and an offloading pad. Both given in office today. Procedures Wound #1 Pre-procedure diagnosis of Wound #1 is a Neuropathic Ulcer-Non Diabetic located on the Right,Medial Foot . There was a Chemical/Enzymatic/Mechanical debridement performed by HKalman Shan DO.. Other agent used was gauze and wound cleanser. There was no bleeding. The procedure was tolerated well. Post Debridement Measurements: 1.5cm length x 1cm width x 0.4cm depth; 0.471cm^3 volume. Character of Wound/Ulcer Post Debridement requires further debridement. Post procedure Diagnosis Wound #1: Same as Pre-Procedure Plan Follow-up Appointments: Return Appointment in 1 week. - Thursday Dr HHeber CarolinaBathing/ Shower/ Hygiene: May shower and wash wound with soap and water. - with dressing changes only. Edema Control - Lymphedema / SCD / Other: Elevate legs to the level of the heart or above for 30 minutes daily and/or when sitting, a frequency of: - 3-4 times throughout the day. Avoid standing for long periods of time. Moisturize legs daily. - every night before bed. Off-Loading: Open toe surgical shoe to: - ***clinic to provide patient with surgical shoe to both feet today.*** Additional Orders / Instructions: Other: - WLindwill call primary care provider for x-ray results of right foot.  WOUND #1: - Foot Wound Laterality: Right, Medial Cleanser: Soap and Water Every Other Day/15 Days Discharge Instructions: May shower and wash wound with dial antibacterial soap and water prior to dressing change. Cleanser: Byram Ancillary Kit - 15 Day Supply (DME) (Generic) Every Other Day/15 Days Discharge Instructions: Use supplies as instructed; Kit contains: (15) Saline Bullets; (15) 3x3 Gauze; 15 pr Gloves Prim Dressing:  Promogran Prisma Matrix, 4.34 (sq in) (silver collagen) (DME) (Generic) Every Other Day/15 Days ary Discharge Instructions: Moisten collagen with ***KY jelly.**** Secondary Dressing: Woven Gauze Sponges 2x2 in (DME) (Generic) Every Other Day/15 Days Discharge Instructions: Apply over primary dressing as directed. Secondary Dressing: Optifoam Non-Adhesive Dressing, 4x4 in (DME) (Generic) Every Other Day/15 Days Discharge Instructions: ***foam donut***Apply over primary dressing as directed. Secured With: Child psychotherapist, Sterile 2x75 (in/in) (DME) (Generic) Every Other Day/15 Days Discharge Instructions: Secure with stretch gauze as directed. Secured With: 62M Medipore Karl Soft Cloth Surgical T 4 x 2 (in/yd) (DME) (Generic) Every Other Day/15 Days ape Discharge Instructions: Secure dressing with tape as directed. 1. Silver collagen daily 2. Aggressive offloading 3. MRI of the right foot 4. Follow-up in 1 week Electronic Signature(s) Signed: 09/26/2020 4:30:02 PM By: Karl Shan DO Entered By: Karl Stevenson on Stevenson 16:28:54 -------------------------------------------------------------------------------- HxROS Details Patient Name: Date of Service: Karl Stevenson, Karl MES C. 09/26/2020 2:45 PM Medical Record Number: [id number] Patient Account Number: [id number] Date of Birth/Sex: Treating RN: 07-07-1926 (87 y.o. [name] Primary Care Provider: Leanna Stevenson Other Clinician: Referring Provider: Treating Provider/Extender: Karl Stevenson in Treatment: 0 Information Obtained From Patient Eyes Complaints and Symptoms: Positive for: Glasses / Contacts Medical History: Positive for: Cataracts Ear/Nose/Mouth/Throat Complaints and Symptoms: Negative for: Chronic sinus problems or rhinitis Endocrine Complaints and Symptoms: Negative for: Heat/cold intolerance Genitourinary Complaints and Symptoms: Negative for: Frequent  urination Integumentary (Skin) Complaints and Symptoms: Positive for: Wounds Musculoskeletal Complaints and Symptoms: Negative for: Muscle Pain; Muscle Weakness Neurologic Complaints and Symptoms: Negative for: Numbness/parasthesias Psychiatric Complaints and Symptoms: Negative for: Claustrophobia; Suicidal Hematologic/Lymphatic Respiratory Medical History: Positive for: Chronic Obstructive Pulmonary Disease (COPD) Cardiovascular Medical History: Positive for: Arrhythmia; Congestive Heart Failure; Coronary Artery Disease; Hypertension; Myocardial Infarction; Peripheral Venous Disease Past Medical History Notes: cardiomyopathy, AAA repair Gastrointestinal Medical History: Positive for: Crohns Immunological Oncologic HBO Extended History Items Eyes: Cataracts Immunizations Pneumococcal Vaccine: Received Pneumococcal Vaccination: Yes Implantable Devices None Family and Social History Cancer: Yes - Mother; Diabetes: Yes - Child; Heart Disease: Yes - Father; Hereditary Spherocytosis: No; Hypertension: No; Kidney Disease: No; Lung Disease: Yes - Siblings; Seizures: No; Stroke: No; Thyroid Problems: No; Tuberculosis: No; Never smoker; Marital Status - Married; Alcohol Use: Rarely; Drug Use: No History; Caffeine Use: Moderate; Financial Concerns: No; Food, Clothing or Shelter Needs: No; Support System Lacking: No; Transportation Concerns: No Electronic Signature(s) Signed: 09/26/2020 4:30:02 PM By: Karl Shan DO Signed: 09/26/2020 5:39:50 PM By: Lorrin Jackson Entered By: Lorrin Jackson on Stevenson 15:02:06 -------------------------------------------------------------------------------- SuperBill Details Patient Name: Date of Service: Karl Stevenson, Karl MES C. 09/26/2020 Medical Record Number: [id number] Patient Account Number: [id number] Date of Birth/Sex: Treating RN: 09-16-1926 (87 y.o. Karl Stevenson Primary Care Provider: Leanna Stevenson Other Clinician: Referring  Provider: Treating Provider/Extender: Karl Stevenson in Treatment: 0 Diagnosis Coding ICD-10 Codes Code Description [phone] Non-pressure chronic ulcer of other part of right foot with unspecified severity I73.9 Peripheral vascular disease, unspecified G90.09 Other idiopathic peripheral autonomic neuropathy O53.66 Chronic systolic (congestive) heart failure I25.119 Atherosclerotic heart disease of  native coronary artery with unspecified angina pectoris Facility Procedures CPT4 Code: [id number] Description: 75339 - WOUND CARE VISIT-LEV 3 EST PT Modifier: Quantity: 1 CPT4 Code: [id number] Description: 75423 - DEBRIDE W/O ANES NON SELECT Modifier: Quantity: 1 Physician Procedures : CPT4 Code Description Modifier [id number] 20910 - WC PHYS LEVEL 4 - NEW PT ICD-10 Diagnosis Description L97.519 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

## 2020-10-10 ENCOUNTER — Other Ambulatory Visit: Payer: Self-pay

## 2020-10-10 ENCOUNTER — Ambulatory Visit (HOSPITAL_COMMUNITY)
Admission: RE | Admit: 2020-10-10 | Discharge: 2020-10-10 | Disposition: A | Discharge status: Home / Self Care | Payer: Medicare PPO | Source: Ambulatory Visit | Attending: Internal Medicine | Admitting: Internal Medicine

## 2020-10-10 DIAGNOSIS — L97519 Non-pressure chronic ulcer of other part of right foot with unspecified severity: Secondary | ICD-10-CM | POA: Diagnosis not present

## 2020-10-10 DIAGNOSIS — M868X7 Other osteomyelitis, ankle and foot: Secondary | ICD-10-CM | POA: Diagnosis not present

## 2020-10-10 DIAGNOSIS — S91101A Unspecified open wound of right great toe without damage to nail, initial encounter: Secondary | ICD-10-CM | POA: Diagnosis not present

## 2020-10-10 DIAGNOSIS — M7989 Other specified soft tissue disorders: Secondary | ICD-10-CM | POA: Diagnosis not present

## 2020-10-10 MED ORDER — GADOBUTROL 1 MMOL/ML IV SOLN
6.0000 mL | Freq: Once | INTRAVENOUS | Status: AC | PRN
  Administered 2020-10-10: 6 mL via INTRAVENOUS

## 2020-10-14 ENCOUNTER — Other Ambulatory Visit: Payer: Self-pay

## 2020-10-14 ENCOUNTER — Encounter (HOSPITAL_BASED_OUTPATIENT_CLINIC_OR_DEPARTMENT_OTHER): Payer: Medicare PPO | Attending: Internal Medicine | Admitting: Internal Medicine

## 2020-10-14 DIAGNOSIS — G629 Polyneuropathy, unspecified: Secondary | ICD-10-CM | POA: Diagnosis not present

## 2020-10-14 DIAGNOSIS — I739 Peripheral vascular disease, unspecified: Secondary | ICD-10-CM | POA: Insufficient documentation

## 2020-10-14 DIAGNOSIS — L97519 Non-pressure chronic ulcer of other part of right foot with unspecified severity: Secondary | ICD-10-CM

## 2020-10-14 DIAGNOSIS — M86171 Other acute osteomyelitis, right ankle and foot: Secondary | ICD-10-CM

## 2020-10-14 DIAGNOSIS — I5022 Chronic systolic (congestive) heart failure: Secondary | ICD-10-CM | POA: Insufficient documentation

## 2020-10-14 DIAGNOSIS — I110 Hypertensive heart disease with heart failure: Secondary | ICD-10-CM | POA: Insufficient documentation

## 2020-10-14 DIAGNOSIS — I96 Gangrene, not elsewhere classified: Secondary | ICD-10-CM | POA: Diagnosis not present

## 2020-10-14 DIAGNOSIS — G9009 Other idiopathic peripheral autonomic neuropathy: Secondary | ICD-10-CM | POA: Diagnosis not present

## 2020-10-14 DIAGNOSIS — Z95820 Peripheral vascular angioplasty status with implants and grafts: Secondary | ICD-10-CM | POA: Diagnosis not present

## 2020-10-14 DIAGNOSIS — I2510 Atherosclerotic heart disease of native coronary artery without angina pectoris: Secondary | ICD-10-CM | POA: Diagnosis not present

## 2020-10-15 DIAGNOSIS — I739 Peripheral vascular disease, unspecified: Secondary | ICD-10-CM | POA: Diagnosis not present

## 2020-10-15 DIAGNOSIS — G9009 Other idiopathic peripheral autonomic neuropathy: Secondary | ICD-10-CM | POA: Diagnosis not present

## 2020-10-15 DIAGNOSIS — I5022 Chronic systolic (congestive) heart failure: Secondary | ICD-10-CM | POA: Diagnosis not present

## 2020-10-15 DIAGNOSIS — L97519 Non-pressure chronic ulcer of other part of right foot with unspecified severity: Secondary | ICD-10-CM | POA: Diagnosis not present

## 2020-10-16 ENCOUNTER — Other Ambulatory Visit: Payer: Self-pay

## 2020-10-16 ENCOUNTER — Ambulatory Visit (INDEPENDENT_AMBULATORY_CARE_PROVIDER_SITE_OTHER): Payer: Medicare PPO | Admitting: Infectious Diseases

## 2020-10-16 ENCOUNTER — Telehealth: Payer: Self-pay

## 2020-10-16 ENCOUNTER — Encounter: Payer: Self-pay | Admitting: Infectious Diseases

## 2020-10-16 VITALS — BP 159/79 | HR 100 | Temp 97.3°F | Wt 134.0 lb

## 2020-10-16 DIAGNOSIS — I739 Peripheral vascular disease, unspecified: Secondary | ICD-10-CM

## 2020-10-16 DIAGNOSIS — Z5181 Encounter for therapeutic drug level monitoring: Secondary | ICD-10-CM

## 2020-10-16 DIAGNOSIS — M869 Osteomyelitis, unspecified: Secondary | ICD-10-CM | POA: Diagnosis not present

## 2020-10-16 MED ORDER — AMOXICILLIN-POT CLAVULANATE 500-125 MG PO TABS: 1.0000 | ORAL_TABLET | Freq: Two times a day (BID) | ORAL | 0 refills | Status: DC

## 2020-10-16 NOTE — Telephone Encounter (Signed)
Faxed order to [facility] to setup for  IV / antibiotic, called and left voicemail at the Short Stay to PICC line , to arrange an appt for PICC line. Pt is able to have this done any day of this week per his son.

## 2020-10-16 NOTE — Progress Notes (Signed)
EVIN, LOISEAU ([id number]) Visit Report for 10/14/2020 Chief Complaint Document Details Patient Name: Date of Service: Karl Stevenson MES C. 10/14/2020 1:45 PM Medical Record Number: [id number] Patient Account Number: [id number] Date of Birth/Sex: Treating RN: Sep 04, 1926 (87 y.o. Karl Stevenson Primary Care Provider: [name] Other Clinician: Referring Provider: Treating Provider/Extender: [name] in Treatment: 2 Information Obtained from: Patient Chief Complaint Right foot wound Electronic Signature(s) Signed: 10/14/2020 3:04:12 PM By: [name] DO Entered By: [name] on 10/14/2020 14:51:25 -------------------------------------------------------------------------------- Debridement Details Patient Name: Date of Service: Karl Stevenson, Stevenson MES C. 10/14/2020 1:45 PM Medical Record Number: [id number] Patient Account Number: [id number] Date of Birth/Sex: Treating RN: 1927/02/11 (87 y.o. Karl Stevenson Primary Care Provider: [name] Other Clinician: Referring Provider: Treating Provider/Extender: [name] in Treatment: 2 Debridement Performed for Assessment: Wound #1 Right,Medial Foot Performed By: Physician [name], DO Debridement Type: Debridement Level of Consciousness (Pre-procedure): Awake and Alert Pre-procedure Verification/Time Out Yes - 14:40 Taken: Start Time: 14:40 T Area Debrided (L x W): otal 1 (cm) x 0.8 (cm) = 0.8 (cm) Tissue and other material debrided: Viable, Non-Viable, Bone Level: Skin/Subcutaneous Tissue/Muscle/Bone Debridement Description: Excisional Instrument: Curette Bleeding: Moderate Hemostasis Achieved: Silver Nitrate End Time: 14:43 Procedural Pain: 0 Post Procedural Pain: 0 Response to Treatment: Procedure was tolerated well Level of Consciousness (Post- Awake and Alert procedure): Post Debridement Measurements of Total Wound Length: (cm)  1 Width: (cm) 0.8 Depth: (cm) 0.3 Volume: (cm) 0.188 Character of Wound/Ulcer Post Debridement: Requires Further Debridement Post Procedure Diagnosis Same as Pre-procedure Electronic Signature(s) Signed: 10/14/2020 3:04:12 PM By: [name] DO Signed: 10/16/2020 5:57:05 PM By: [name] RN, BSN Entered By: [name] on 10/14/2020 15:00:50 -------------------------------------------------------------------------------- HPI Details Patient Name: Date of Service: Karl Stevenson MES C. 10/14/2020 1:45 PM Medical Record Number: [id number] Patient Account Number: [id number] Date of Birth/Sex: Treating RN: 1926/08/05 (87 y.o. Karl Stevenson Primary Care Provider: [name] Other Clinician: Referring Provider: Treating Provider/Extender: [name] in Treatment: 2 History of Present Illness HPI Description: Karl Stevenson is a 87 year old male with a past medical history of peripheral vascular disease, coronary artery disease, chronic systolic heart failure, and peripheral neuropathy that presents to our clinic for a 59-monthhistory of right foot wound. He thinks that the wound started by Rubbing against his shoes. He states he was evaluated by his primary care physician who obtained an x-ray of the right foot. They were sent to vein and vascular for further assessment of the blood flow to the lower extremities. He had an aortogram on 09/05/2020 and had a right posterior tibial artery angioplasty. He states that the wound has been stable in appearance and size since the procedure. He has been using Bactroban and covering the area with a Band-Aid. He is using soft bedroom shoes. He denies acute signs of infection. 5/26; patient presents for 1 week follow-up. He has been using collagen to the wound with dressing changes. He has no complaints and denies acute signs of infection today. He is scheduled for an MRI on 6/2 6/6; patient presents for 1  week follow-up. He has been using collagen to the wound. He has noticed some increased redness to the wound site. He obtained his MRI. Electronic Signature(s) Signed: 10/14/2020 3:04:12 PM By: HKalman ShanDO Entered By: HKalman Shanon 10/14/2020 14:52:01 -------------------------------------------------------------------------------- Physical Exam Details Patient Name: Date of Service: Karl Stevenson MES C. 10/14/2020 1:45 PM Medical Record  Number: [id number] Patient Account Number: [id number] Date of Birth/Sex: Treating RN: [birth year] (87 y.o. Karl Stevenson Primary Care Provider: [name] Other Clinician: Referring Provider: Treating Provider/Extender: [name] in Treatment: 2 Constitutional respirations regular, non-labored and within target range for patient.[name] Psychiatric pleasant and cooperative. Notes Right foot: Medial aspect with an open wound that probes to bone. The tissue is pale in the wound bed. There is surrounding callus. There Increased erythema and warmth to the surrounding wound bed. Difficult to palpate pedal pulse Electronic Signature(s) Signed: 10/14/2020 3:04:12 PM By: [name] DO Entered By: [name] on 10/14/2020 14:53:01 -------------------------------------------------------------------------------- Physician Orders Details Patient Name: Date of Service: Karl Stevenson MES C. 10/14/2020 1:45 PM Medical Record Number: [id number] Patient Account Number: [id number] Date of Birth/Sex: Treating RN: 09-11-1926 (87 y.o. Karl Stevenson Primary Care Provider: [name] Other Clinician: Referring Provider: Treating Provider/Extender: [name] in Treatment: 2 Verbal / Phone Orders: No Diagnosis Coding ICD-10 Coding Code Description L97.519 Non-pressure chronic ulcer of other part of right foot with unspecified severity I73.9 Peripheral vascular disease,  unspecified G90.09 Other idiopathic peripheral autonomic neuropathy Z85.88 Chronic systolic (congestive) heart failure I25.119 Atherosclerotic heart disease of native coronary artery with unspecified angina pectoris M86.171 Other acute osteomyelitis, right ankle and foot Follow-up Appointments Return Appointment in 1 week. Bathing/ Shower/ Hygiene May shower and wash wound with soap and water. - with dressing changes only. Edema Control - Lymphedema / SCD / Other Elevate legs to the level of the heart or above for 30 minutes daily and/or when sitting, a frequency of: - 3-4 times throughout the day. Avoid standing for long periods of time. Moisturize legs daily. - every night before bed. Off-Loading Open toe surgical shoe to: - both feet wear while walking. Wound Treatment Wound #1 - Foot Wound Laterality: Right, Medial Cleanser: Soap and Water Every Other Day/15 Days Discharge Instructions: May shower and wash wound with dial antibacterial soap and water prior to dressing change. Cleanser: Byram Ancillary Kit - 15 Day Supply (DME) (Generic) Every Other Day/15 Days Discharge Instructions: Use supplies as instructed; Kit contains: (15) Saline Bullets; (15) 3x3 Gauze; 15 pr Gloves Prim Dressing: Promogran Prisma Matrix, 4.34 (sq in) (silver collagen) (DME) (Generic) Every Other Day/15 Days ary Discharge Instructions: Moisten collagen with ***KY jelly.**** Secondary Dressing: Woven Gauze Sponges 2x2 in (DME) (Generic) Every Other Day/15 Days Discharge Instructions: Apply over primary dressing as directed. Secondary Dressing: Optifoam Non-Adhesive Dressing, 4x4 in (DME) (Generic) Every Other Day/15 Days Discharge Instructions: ***foam donut***Apply over primary dressing as directed. Secured With: Child psychotherapist, Sterile 2x75 (in/in) (DME) (Generic) Every Other Day/15 Days Discharge Instructions: Secure with stretch gauze as directed. Secured With: 31M Medipore H Soft Cloth  Surgical T 4 x 2 (in/yd) (DME) (Generic) Every Other Day/15 Days ape Discharge Instructions: Secure dressing with tape as directed. Consults nkle) - *Urgent* - Osteomyelitis right foot, discuss surgical options - (ICD10 L97.519 - Non-pressure chronic ulcer of other Podiatry ([facility] part of right foot with unspecified severity) Infectious Disease - *Urgent* - Osteomyelitis right foot - (ICD10 L97.519 - Non-pressure chronic ulcer of other part of right foot with unspecified severity) Laboratory naerobe culture (MICRO) - Bone - Right medial foot - (ICD10 L97.519 - Non-pressure chronic Bacteria identified in Unspecified specimen by A ulcer of other part of right foot with unspecified severity) LOINC Code: 635-3 Convenience Name: Anerobic culture Bacteria identified in Tissue by Biopsy culture (MICRO) - Bone -  Right medial foot - (ICD10 L97.519 - Non-pressure chronic ulcer of other part of right foot with unspecified severity) LOINC Code: [phone] Convenience Name: Biopsy specimen culture Patient Medications llergies: Novocain, amoxicillin, metoprolol, procaine A Notifications Medication Indication Start End 10/14/2020 doxycycline hyclate DOSE 1 - oral 100 mg tablet - 1 tablet twice daily for 2 weeks Electronic Signature(s) Signed: 10/14/2020 3:04:12 PM By: [name] DO Previous Signature: 10/14/2020 2:59:55 PM Version By: [name] DO Entered By: [name] on 10/14/2020 15:01:55 Prescription 10/14/2020 -------------------------------------------------------------------------------- Blash, Jeneen Rinks C. [name] DO Patient Name: Provider: [birth year] [id number] Date of Birth: NPI#: Jerilynn Mages [id number] Sex: DEA #: [phone] Phone #: License #: [place] Patient Address: [address] [place], [place] [address] [place], [place] [address] [phone] Allergies Novocain;  amoxicillin; metoprolol; procaine Provider's Orders nkle) - ICD10: L97.519 - *Urgent* - Osteomyelitis right foot, discuss surgical options Podiatry ([facility] Hand Signature: Date(s): Prescription 10/14/2020 Jacober, Gabriell C. [name] DO Patient Name: Provider: 12/04/1926 [id number] Date of Birth: NPI#: Jerilynn Mages [id number] Sex: DEA #: [phone] Phone #: License #: [place] Patient Address: [address] [place], [place] [address] [place], [place] [address] [phone] Allergies Novocain; amoxicillin; metoprolol; procaine Provider's Orders Infectious Disease - ICD10: L97.519 - *Urgent* - Osteomyelitis right foot Hand Signature: Date(s): Electronic Signature(s) Signed: 10/14/2020 3:04:12 PM By: [name] DO Entered By: [name] on 10/14/2020 15:03:50 -------------------------------------------------------------------------------- Problem List Details Patient Name: Date of Service: Karl Stevenson, Stevenson MES C. 10/14/2020 1:45 PM Medical Record Number: [id number] Patient Account Number: [id number] Date of Birth/Sex: Treating RN: June 17, 1926 (87 y.o. Karl Stevenson Primary Care Provider: [name] Other Clinician: Referring Provider: Treating Provider/Extender: [name] in Treatment: 2 Active Problems ICD-10 Encounter Code Description Active Date MDM Diagnosis L97.519 Non-pressure chronic ulcer of other part of right foot with unspecified severity 09/26/2020 No Yes I73.9 Peripheral vascular disease, unspecified 09/26/2020 No Yes G90.09 Other idiopathic peripheral autonomic neuropathy 09/26/2020 No Yes V78.58 Chronic systolic (congestive) heart failure 09/26/2020 No Yes I25.119 Atherosclerotic heart disease of native coronary artery with unspecified angina 09/26/2020 No Yes pectoris M86.171 Other acute osteomyelitis, right ankle and foot 10/14/2020  No Yes Inactive Problems Resolved Problems Electronic Signature(s) Signed: 10/14/2020 3:04:12 PM By: [name] DO Entered By: [name] on 10/14/2020 15:01:27 -------------------------------------------------------------------------------- Progress Note Details Patient Name: Date of Service: Karl Stevenson MES C. 10/14/2020 1:45 PM Medical Record Number: [id number] Patient Account Number: [id number] Date of Birth/Sex: Treating RN: Mar 14, 1927 (87 y.o. Karl Stevenson Primary Care Provider: [name] Other Clinician: Referring Provider: Treating Provider/Extender: [name] in Treatment: 2 Subjective Chief Complaint Information obtained from Patient Right foot wound History of Present Illness (HPI) Karl Stevenson is a 87 year old male with a past medical history of peripheral vascular disease, coronary artery disease, chronic systolic heart failure, and peripheral neuropathy that presents to our clinic for a 36-monthhistory of right foot wound. He thinks that the wound started by Rubbing against his shoes. He states he was evaluated by his primary care physician who obtained an x-ray of the right foot. They were sent to vein and vascular for further assessment of the blood flow to the lower extremities. He had an aortogram on 09/05/2020 and had a right posterior tibial artery angioplasty. He states that the wound has been stable in appearance and size  since the procedure. He has been using Bactroban and covering the area with a Band-Aid. He is using soft bedroom shoes. He denies acute signs of infection. 5/26; patient presents for 1 week follow-up. He has been using collagen to the wound with dressing changes. He has no complaints and denies acute signs of infection today. He is scheduled for an MRI on 6/2 6/6; patient presents for 1 week follow-up. He has been using collagen to the wound. He has noticed some increased redness to the wound  site. He obtained his MRI. Patient History Information obtained from Patient. Family History Cancer - Mother, Diabetes - Child, Heart Disease - Father, Lung Disease - Siblings, No family history of Hereditary Spherocytosis, Hypertension, Kidney Disease, Seizures, Stroke, Thyroid Problems, Tuberculosis. Social History Never smoker, Marital Status - Married, Alcohol Use - Rarely, Drug Use - No History, Caffeine Use - Moderate. Medical History Eyes Patient has history of Cataracts Respiratory Patient has history of Chronic Obstructive Pulmonary Disease (COPD) Cardiovascular Patient has history of Arrhythmia, Congestive Heart Failure, Coronary Artery Disease, Hypertension, Myocardial Infarction, Peripheral Venous Disease Gastrointestinal Patient has history of Crohnoos Medical A Surgical History Notes nd Cardiovascular cardiomyopathy, AAA repair Objective Constitutional respirations regular, non-labored and within target range for patient.. Vitals Time Taken: 1:53 PM, Height: 63 in, Weight: 136 lbs, BMI: 24.1, Temperature: 98.1 F, Pulse: 78 bpm, Respiratory Rate: 17 breaths/min, Blood Pressure: 129/68 mmHg. Psychiatric pleasant and cooperative. General Notes: Right foot: Medial aspect with an open wound that probes to bone. The tissue is pale in the wound bed. There is surrounding callus. There Increased erythema and warmth to the surrounding wound bed. Difficult to palpate pedal pulse Integumentary (Hair, Skin) Wound #1 status is Open. Original cause of wound was Gradually Appeared. The date acquired was: 07/23/2020. The wound has been in treatment 2 weeks. The wound is located on the Right,Medial Foot. The wound measures 1cm length x 0.8cm width x 0.3cm depth; 0.628cm^2 area and 0.188cm^3 volume. There is Fat Layer (Subcutaneous Tissue) exposed. There is no tunneling or undermining noted. There is a medium amount of serosanguineous drainage noted. The wound margin is well defined  and not attached to the wound base. There is medium (34-66%) pink, pale granulation within the wound bed. There is a medium (34-66%) amount of necrotic tissue within the wound bed including Adherent Slough. 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 Results were discussed with patient and his wife today. We discussed 6 weeks of antibiotics versus amputation. They are not sure what they would like to do and they want to discuss with other family members. At this time I recommended a bone biopsy and this was obtained with the patient's consent. He does appear to have a soft tissue infection although mild at this time. I will start doxycycline. We will also put a referral urgently to infectious disease and podiatry to discuss further treatment options. I recommended he go to the ED if his foot were to worsen. This includes increased spreading erythema and pain. Over 45 minutes was spent with the patient face-to-face discussing MRI results and future treatment plans. Procedures  Wound #1 Pre-procedure diagnosis of Wound #1 is a Neuropathic Ulcer-Non Diabetic located on the Right,Medial Foot . There was a Excisional Skin/Subcutaneous Tissue/Muscle/Bone Debridement with a total area of 0.8 sq cm performed by [name], DO. With the following instrument(s): Curette to remove Viable and Non-Viable tissue/material. Material removed includes Bone. No specimens were taken. A time out was conducted at  14:40, prior to the start of the procedure. A Moderate amount of bleeding was controlled with Silver Nitrate. The procedure was tolerated well with a pain level of 0 throughout and a pain level of 0 following the procedure. Post Debridement Measurements: 1cm length x 0.8cm width x 0.3cm depth; 0.188cm^3 volume. Character of Wound/Ulcer Post Debridement requires further debridement. Post procedure Diagnosis Wound #1: Same as Pre-Procedure Plan Follow-up Appointments: Return Appointment in 1 week. Bathing/ Shower/ Hygiene: May shower and wash wound with soap and water. - with dressing changes only. Edema Control - Lymphedema / SCD / Other: Elevate legs to the level of the heart or above for 30 minutes daily and/or when sitting, a frequency of: - 3-4 times throughout the day. Avoid standing for long periods of time. Moisturize legs daily. - every night before bed. Off-Loading: Open toe surgical shoe to: - both feet wear while walking. Consults ordered were: Podiatry ([facility]) - *Urgent* - Osteomyelitis right foot, discuss surgical options, Infectious Disease - *Urgent* - Osteomyelitis right foot Laboratory ordered were: Anerobic culture - Bone - Right medial foot, Biopsy specimen culture - Bone - Right medial foot The following medication(s) was prescribed: doxycycline hyclate oral 100 mg tablet 1 1 tablet twice daily for 2 weeks starting 10/14/2020 WOUND #1: - Foot Wound Laterality: Right, Medial Cleanser: Soap and Water Every Other Day/15 Days Discharge Instructions: May shower and wash wound with dial antibacterial soap and water prior to dressing change. Cleanser: Byram Ancillary Kit - 15 Day Supply (DME) (Generic) Every Other Day/15 Days Discharge Instructions: Use supplies as instructed; Kit contains: (15) Saline Bullets; (15) 3x3 Gauze; 15 pr Gloves Prim Dressing: Promogran Prisma Matrix, 4.34 (sq in) (silver collagen) (DME) (Generic) Every Other Day/15 Days ary Discharge  Instructions: Moisten collagen with ***KY jelly.**** Secondary Dressing: Woven Gauze Sponges 2x2 in (DME) (Generic) Every Other Day/15 Days Discharge Instructions: Apply over primary dressing as directed. Secondary Dressing: Optifoam Non-Adhesive Dressing, 4x4 in (DME) (Generic) Every Other Day/15 Days Discharge Instructions: ***foam donut***Apply over primary dressing as directed. Secured With: Child psychotherapist, Sterile 2x75 (in/in) (DME) (Generic) Every Other Day/15 Days Discharge Instructions: Secure with stretch gauze as directed. Secured With: 30M Medipore H Soft Cloth Surgical T 4 x 2 (in/yd) (DME) (Generic) Every Other Day/15 Days ape Discharge Instructions: Secure dressing with tape as directed. 1. Doxycycline 2. Urgent referral to infectious disease and podiatry 3. Follow-up in 1 week 4. Continue collagen Electronic Signature(s) Signed: 10/14/2020 3:04:12 PM By: [name] DO Entered By: [name] on 10/14/2020 15:03:25 -------------------------------------------------------------------------------- HxROS Details Patient Name: Date of Service: Karl Stevenson MES C. 10/14/2020 1:45 PM Medical Record Number: [id number] Patient Account Number: [id number] Date of Birth/Sex: Treating RN: 1926/08/09 (87 y.o. Karl Stevenson Primary Care Provider: [name] Other Clinician: Referring Provider: Treating Provider/Extender: [name] in Treatment: 2 Information Obtained From Patient Eyes Medical History: Positive for: Cataracts Respiratory Medical History: Positive for: Chronic Obstructive Pulmonary Disease (COPD) Cardiovascular Medical History: Positive for: Arrhythmia; Congestive Heart Failure; Coronary Artery Disease; Hypertension; Myocardial Infarction; Peripheral Venous Disease Past Medical History Notes: cardiomyopathy, AAA  repair Gastrointestinal Medical History: Positive for: Crohns HBO Extended History  Items Eyes: Cataracts Immunizations Pneumococcal Vaccine: Received Pneumococcal Vaccination: Yes Implantable Devices None Family and Social History Cancer: Yes - Mother; Diabetes: Yes - Child; Heart Disease: Yes - Father; Hereditary Spherocytosis: No; Hypertension: No; Kidney Disease: No; Lung Disease: Yes - Siblings; Seizures: No; Stroke: No; Thyroid Problems: No; Tuberculosis: No; Never smoker; Marital Status - Married; Alcohol Use: Rarely; Drug Use: No History; Caffeine Use: Moderate; Financial Concerns: No; Food, Clothing or Shelter Needs: No; Support System Lacking: No; Transportation Concerns: No Electronic Signature(s) Signed: 10/14/2020 3:04:12 PM By: [name] DO Signed: 10/16/2020 5:57:05 PM By: [name] RN, BSN Signed: 10/16/2020 5:57:05 PM By: [name] RN, BSN Entered By: [name] on 10/14/2020 14:52:07 -------------------------------------------------------------------------------- [place] Details Patient Name: Date of Service: Karl ILIFF, Lindsay MES C. 10/14/2020 Medical Record Number: [id number] Patient Account Number: [id number] Date of Birth/Sex: Treating RN: 02-18-1927 (87 y.o. Karl Stevenson Primary Care Provider: [name] Other Clinician: Referring Provider: Treating Provider/Extender: [name] in Treatment: 2 Diagnosis Coding ICD-10 Codes Code Description [phone] Non-pressure chronic ulcer of other part of right foot with unspecified severity I73.9 Peripheral vascular disease, unspecified G90.09 Other idiopathic peripheral autonomic neuropathy R98.06 Chronic systolic (congestive) heart failure I25.119 Atherosclerotic heart disease of native coronary artery with unspecified angina pectoris M86.171 Other acute osteomyelitis, right ankle and foot Facility Procedures CPT4 Code: [id number] Description: 73750 - DEB BONE 20 SQ CM/< ICD-10 Diagnosis Description M86.171 Other acute osteomyelitis, right ankle  and foot L97.519 Non-pressure chronic ulcer of other part of right foot with unspecified sev I73.9 Peripheral vascular disease,  unspecified Modifier: erity Quantity: 1 Physician Procedures CPT4: Description Modifier Code [id number] Debridement; bone (includes epidermis, dermis, subQ tissue, muscle and/or fascia, if performed) 1st 20 sqcm or less ICD-10 Diagnosis Description M86.171 Other acute osteomyelitis, right ankle and foot L97.519  Non-pressure chronic ulcer of other part of right foot with unspecified severity I73.9 Peripheral vascular disease, unspecified Quantity: 1 Electronic Signature(s) Signed: 10/14/2020 3:04:12 PM By: [name] DO Entered By: [name] on 10/14/2020 15:03:43

## 2020-10-16 NOTE — Progress Notes (Addendum)
[facility]                                                             [place], [place], [place], [address]                                                                  Phn. [phone]; Fax: [phone]                                                                             Date: 10/16/20  Reason for Referral: Osteomyelitis  requesting  Provider: Bevelyn Buckles  Assessment Rt Foot osteomyelitis, 1st metatarsal PVD s/p intervention  CAD s/p CABG Carotid stenosis CKD Medication Monitoring   Problem List Items Addressed This Visit      Cardiovascular and Mediastinum   PVD (peripheral vascular disease) ([place])     Musculoskeletal and Integument   Osteomyelitis ([place]) - Primary   Relevant Orders   CBC   Comprehensive metabolic panel   C-reactive protein   Sedimentation rate   IRPICC PLACEMENT LEFT >5 YRS INC IMG GUIDE     Other   Medication monitoring encounter      Plan PICC line  Daptomycin 529m IV q48 hrs and ceftriaxone 2g iv q 24 hrs  Plan for 6 weeks  [facility] Weekly CBC, CMP, ESR, CRP and CPK Continue Doxycyline, add Augmentin ( renal dosing) until IV abx can be set up  Fu in 3 weeks  Baseline labs today  All questions and concerns were discussed and addressed. Patient verbalized understanding of the plan. ____________________________________________________________________________________________________________________  HPI: 87year old male with past medical history of AAA, COPD, CAD s/p CABG, Ischemic cardiomyopathy with chronic systolic heart failure,  Crohn's disease status post colonic resection, hyperlipidemia, hypertension, PVD, renal artery stenosis who is referred from wound care center for evaluation and management of osteomyelitis of the right foot.  Patient is accompanied by his son.  Patient states he had  developed a sore in his right medial foot approximately 3 months ago.  He does not remember how did he developed a sore.  Does not remember any trauma, surgery or insect bite.  He thinks the wound started bivalving against his shoes he has been following up with wound care center and an the wound was thought to be doing well until recently when it started getting red with yellowish/greenish drainage for which there was concern of cellulitis/osteomyelitis.  He also had an MRI right foot done on 6/2 with findings concerning for osteomyelitis of the right first metatarsal and referred to infectious disease for management of antibiotics  Patient w recently had a aortogram, right lower extremity arteriogram and right posterior tibial artery angioplasty for critical limb  ischemia of right lower extremity with tissue loss on 4/28.  He was optimized from a vascular surgery standpoint.  He is on dual antiplatelet therapy per vascular surgery.   ROS: Constitutional: Negative for fever, chills, activity change, appetite change, fatigue and unexpected weight change.  HENT: Negative for congestion, sore throat, rhinorrhea, sneezing, trouble swallowing and sinus pressure.  Eyes: Negative for photophobia and visual disturbance.  Respiratory: Negative for cough, chest tightness, shortness of breath, wheezing and stridor.  Cardiovascular: Negative for chest pain, palpitations and leg swelling.  Gastrointestinal: Negative for nausea, vomiting, abdominal pain, diarrhea, constipation, blood in stool, abdominal distention and anal bleeding.  Genitourinary: Negative for dysuria, hematuria, flank pain and difficulty urinating.  Musculoskeletal: Negative for myalgias, back pain, joint swelling, arthralgias and gait problem.  Skin: Negative for color change, pallor, rash and wound.  Neurological: Negative for dizziness, tremors, weakness and light-headedness.  Hematological: Negative for adenopathy. Does not bruise/bleed  easily.  Psychiatric/Behavioral: Negative for behavioral problems, confusion, sleep disturbance, dysphoric mood, decreased concentration and agitation.   Past Medical History:  Diagnosis Date  . AAA (abdominal aortic aneurysm) ([place])    a. Ectatic abdominal aorta with fusiform dilitation 3.4x3.4 and moderate thrombus burden 12/2010  . Bradycardia    a. previous intolerance to beta blockers - low dose toprol started 10/2011 for tachycardia but had rash and was discontinued;  b. tolerating low dose propranolol.  . Carotid artery occlusion    a. s/p L CEA in 2010;  b. 06/2014 Carotid U/S: stable LICA CEA site w/o significant stenosis bilat.  . Chronic systolic heart failure ([place])    a. 02/2015 Echo:  35-40% (echo 11/2010); c. 02/2015 Echo: EF 20-25%, diff HK, mid-apicalanteroseptal and apical AK, Gr 1 DD, mild MR.  [name] COPD (chronic obstructive pulmonary disease) ([place])   . Coronary artery disease    a. 1969 s/p MI;  b. S/P CABG 1987;  c. S/P redo 2008;  d. 07/2012 Cath: stable->Med Rx, EF 40%; e. 02/2014 MV: anterior and posterolateral/apical infarct with mild peri-infarct ischemia->Med Rx; f. 02/2015 Cath: LM 100d, LAD 100ost, LCX 100ost, OM2 100, VG->OM3->OM4 nl, VG-> LAD nl (Y from VG->OM), VG->OM2 100p.  . Crohn's disease ([place])    a. s/p colon resection  . Gout   . Hyperlipidemia   . Hypertension   . Ischemic cardiomyopathy    a. EF 34% (Lexiscan 06/2011); b. 35-40% (echo 11/2010); c. 02/2015 Echo: EF 20-25%, diff HK, mid-apicalanteroseptal and apical AK, Gr 1 DD, mild MR.  . Peripheral vascular disease ([place])    a. Carotid dz s/p L CEA, RAS, AAA, LE dz  . RBBB   . Renal artery stenosis ([place])    a. Dopplers 12/2010 - 1-59% R ostial renal artery stenosis, 2 L renal arteries both widely patent  . Syncope   . Tachycardia    a. 10/2011 - atrial tach vs avnrt - BB started.  . Ventricular tachycardia ([place])    a. 02/2015 ->amio 200 daily added-->later switched to mexilitene.   Past Surgical History:   Procedure Laterality Date  . ABDOMINAL AORTAGRAM N/A 06/11/2011   Procedure: ABDOMINAL Maxcine Ham;  Surgeon: Conrad Middleway, MD;  Location: [facility] CATH LAB;  Service: Cardiovascular;  Laterality: N/A;  . ABDOMINAL AORTOGRAM W/LOWER EXTREMITY N/A 09/05/2020   Procedure: ABDOMINAL AORTOGRAM W/LOWER EXTREMITY;  Surgeon: Marty Heck, MD;  Location: [facility] CV LAB;  Service: Cardiovascular;  Laterality: N/A;  . CARDIAC CATHETERIZATION  07/28/2012   Native 3v CAD, continued patency of the  SVG-OM1-LAD and SVG-OM2-left PDA. LVEF 40% with multiple WMAs  . CARDIAC CATHETERIZATION N/A 03/08/2015   Procedure: Left Heart Cath and Coronary Angiography;  Surgeon: Leonie Man, MD;  Location: [facility] CV LAB;  Service: Cardiovascular;  Laterality: N/A;  . CAROTID ENDARTERECTOMY Left 2010  . CATARACT EXTRACTION, BILATERAL    . COLON RESECTION  2008  . COLON SURGERY    . CORONARY ANGIOPLASTY    . CORONARY ARTERY BYPASS GRAFT  10/31/85 & 08/19/06  . ENDARTERECTOMY  08/26/2011   Procedure: ENDARTERECTOMY ILIAC;  Surgeon: Conrad [place], MD;  Location: [facility] OR;  Service: Vascular;  Laterality: Right;  Right Femoral Artery Endarterectomy with vascu guard patch angioplasty & intraoperative arteriogram.  . EYE SURGERY    . FEMORAL ENDARTERECTOMY Left  06/2011   ileofemoral endarterectomy with bovine patch angioplasty  . LEFT HEART CATHETERIZATION WITH CORONARY ANGIOGRAM N/A 07/28/2012   Procedure: LEFT HEART CATHETERIZATION WITH CORONARY ANGIOGRAM;  Surgeon: Sherren Mocha, MD;  Location: [facility] CATH LAB;  Service: Cardiovascular;  Laterality: N/A;  . PERIPHERAL VASCULAR BALLOON ANGIOPLASTY Right 09/05/2020   Procedure: PERIPHERAL VASCULAR BALLOON ANGIOPLASTY;  Surgeon: Marty Heck, MD;  Location: [facility] CV LAB;  Service: Cardiovascular;  Laterality: Right;  Posterior Tibial  . PR VEIN BYPASS GRAFT,AORTO-FEM-POP  10/1985  . TONSILLECTOMY     as a child   Current Outpatient Medications on File Prior to  Visit  Medication Sig Dispense Refill  . acetaminophen (TYLENOL) 500 MG tablet Take 1,000 mg by mouth every 6 (six) hours as needed (pain).     [name] aspirin EC 81 MG tablet Take 81 mg by mouth daily.    [name] atorvastatin (LIPITOR) 10 MG tablet Take 10 mg by mouth daily.    . carboxymethylcellulose (REFRESH PLUS) 0.5 % SOLN Place 1 drop into both eyes 4 (four) times daily.    . cholecalciferol (VITAMIN D3) 25 MCG (1000 UNIT) tablet Take 1,000 Units by mouth daily.    . clopidogrel (PLAVIX) 75 MG tablet TAKE 1 TABLET EVERY DAY (Patient taking differently: Take 75 mg by mouth daily.) 90 tablet 3  . fexofenadine (ALLEGRA) 180 MG tablet Take 180 mg by mouth daily.    [name] ipratropium (ATROVENT) 0.03 % nasal spray Place 1 spray into both nostrils daily.    . isosorbide mononitrate (IMDUR) 120 MG 24 hr tablet Take 120 mg by mouth daily.    . isosorbide mononitrate (IMDUR) 60 MG 24 hr tablet Take 60 mg by mouth daily.    [name] losartan (COZAAR) 25 MG tablet TAKE 1 TABLET TWICE DAILY (Patient taking differently: Take 25 mg by mouth in the morning and at bedtime.) 180 tablet 2  . mexiletine (MEXITIL) 200 MG capsule Take 1 capsule (200 mg total) by mouth every 12 (twelve) hours. 180 capsule 2  . Multiple Vitamin (MULTIVITAMIN WITH MINERALS) TABS tablet Take 2 tablets by mouth daily.    . mupirocin ointment (BACTROBAN) 2 % Apply 1 application topically 2 (two) times daily. Apply to foot    . nitroGLYCERIN (NITROSTAT) 0.4 MG SL tablet Place 1 tablet (0.4 mg total) under the tongue every 5 (five) minutes as needed for chest pain. 25 tablet 3  . OVER THE COUNTER MEDICATION Take 1 capsule by mouth daily. Nerve Health    . pantoprazole (PROTONIX) 40 MG tablet TAKE 1 TABLET EVERY DAY (Patient taking differently: Take 40 mg by mouth daily.) 90 tablet 3  . Polyethyl Glycol-Propyl Glycol (SYSTANE) 0.4-0.3 % SOLN Place 1 drop into  both eyes in the morning and at bedtime.    . polyethylene glycol powder (GLYCOLAX/MIRALAX) powder  Take 17 g by mouth at bedtime. Mix in 8 oz liquid and drink    . ranolazine (RANEXA) 500 MG 12 hr tablet TAKE 2 TABLETS TWICE DAILY 360 tablet 3  . triamcinolone ointment (KENALOG) 0.1 % Apply 1 application topically 2 (two) times daily. Mix with Mupirocin and apply to head    . vitamin B-12 (CYANOCOBALAMIN) 1000 MCG tablet Take 2,000 mcg by mouth daily.     No current facility-administered medications on file prior to visit.   Allergies  Allergen Reactions  . Amoxicillin     diaharrea  . Novocain [Procaine Hcl] Other (See Comments)    Cold sweats and very bad headache.   . Procaine Other (See Comments)    Sweating headache  . Metoprolol Hives, Itching and Rash   Social History   Socioeconomic History  . Marital status: Married    Spouse name: Not on file  . Number of children: Not on file  . Years of education: Not on file  . Highest education level: Not on file  Occupational History  . Not on file  Tobacco Use  . Smoking status: Never Smoker  . Smokeless tobacco: Never Used  Vaping Use  . Vaping Use: Never used  Substance and Sexual Activity  . Alcohol use: No  . Drug use: No  . Sexual activity: Not on file  Other Topics Concern  . Not on file  Social History Narrative  . Not on file   Social Determinants of Health   Financial Resource Strain: Not on file  Food Insecurity: Not on file  Transportation Needs: Not on file  Physical Activity: Not on file  Stress: Not on file  Social Connections: Not on file  Intimate Partner Violence: Not on file     Vitals BP (!) 159/79   Pulse 100   Temp (!) 97.3 F (36.3 C)   Wt 134 lb (60.8 kg)   SpO2 100%   BMI 23.74 kg/m    Examination  General - not in acute distress, comfortably sitting in chair, MILD DIFFICULTY WITH HEARING  HEENT - PEERLA, no pallor and no icterus Chest - b/l clear air entry, no additional sounds CVS- Normal s1s2, RRR Abdomen - Soft, Non tender , non distended Ext- no pedal edema LEFT  FOOT HAS NO WOUND Rt FOOT   Neuro: grossly normal Back - WNL Psych : calm and cooperative   Recent labs CBC Latest Ref Rng & Units 09/05/2020 05/15/2020 01/31/2020  WBC 3.4 - 10.8 x10E3/uL - 4.9 4.9  Hemoglobin 13.0 - 17.0 g/dL 10.2(L) 10.7(L) 10.9(L)  Hematocrit 39.0 - 52.0 % 30.0(L) 31.4(L) 32.8(L)  Platelets 150 - 450 x10E3/uL - 160 148(L)   CMP Latest Ref Rng & Units 09/05/2020 05/15/2020 01/31/2020  Glucose 70 - 99 mg/dL 97 115(H) 110(H)  BUN 8 - 23 mg/dL 28(H) 28 30  Creatinine 0.61 - 1.24 mg/dL 1.40(H) 1.36(H) 1.39(H)  Sodium 135 - 145 mmol/L 142 142 141  Potassium 3.5 - 5.1 mmol/L 4.3 4.9 4.8  Chloride 98 - 111 mmol/L 106 106 106  CO2 20 - 29 mmol/L - 22 24  Calcium 8.6 - 10.2 mg/dL - 9.0 9.0  Total Protein 6.0 - 8.5 g/dL - - -  Total Bilirubin 0.0 - 1.2 mg/dL - - -  Alkaline Phos 39 - 117 IU/L - - -  AST 0 - 40 IU/L - - -  ALT 0 - 44 IU/L - - -     Pertinent Microbiology Results for orders placed or performed in visit on 10/14/20  Aerobic/Anaerobic Culture w Gram Stain (surgical/deep wound)     Status: None (Preliminary result)   Collection Time: 10/14/20  2:40 PM   Specimen: Foot  Result Value Ref Range Status   Specimen Description   Final    FOOT RIGHT Performed at [facility] [address], [place], [place] [address]    Special Requests   Final    NONE Performed at [facility], [place] [address], [place], [place] [address]    Gram Stain   Final    NO WBC SEEN NO ORGANISMS SEEN Performed at [facility], [place] [address], [place], [place] [address]    Culture   Final    CULTURE REINCUBATED FOR BETTER GROWTH NO ANAEROBES ISOLATED; CULTURE IN PROGRESS FOR 5 DAYS    Report Status PENDING  Incomplete     Pertinent Imaging Vascular US ABI 09/24/20 Summary:  Right: Resting right ankle-brachial index is within normal range. No  evidence of significant right lower extremity arterial disease.  The right toe-brachial  index is abnormal.   Left: Resting left ankle-brachial index indicates mild left lower  extremity arterial disease; however, indices may be overestimated due to  calcified vessels.  The left toe-brachial index is abnormal.    MRI rt foot WWO contrast 10/10/20 FINDINGS: Bones/Joint/Cartilage  Abnormal marrow edema and enhancement with corresponding decreased T1 marrow signal involving the first metatarsal head and distal shaft, consistent with osteomyelitis. No fracture or dislocation. Normal alignment. No joint effusion.  Ligaments  Collateral ligaments are intact.  Lisfranc ligament is intact.  Muscles and Tendons Flexor and extensor tendons are intact. Increased T2 signal within the intrinsic muscles of the forefoot, nonspecific, but likely related to diabetic muscle changes.  Soft tissue Ulceration and small sinus tract along the medial aspect of the first metatarsal head, extending to bone. No fluid collection or hematoma. No soft tissue mass. Severe dorsal soft tissue swelling without enhancement, consistent with third spacing or venous insufficiency.  IMPRESSION: 1. Ulceration and small sinus tract along the medial aspect of the first metatarsal head, extending to bone, with underlying osteomyelitis. No abscess.   All pertinent labs/Imagings/notes reviewed. All pertinent plain films and CT images have been personally visualized and interpreted; radiology reports have been reviewed. Decision making incorporated into the Impression / Recommendations.  I have spent 60 minutes for this patient encounter including  review of prior medical records with greater than 50% of time in face to face counsel of the patient/discussing diagnostics and plan of care.   Electronically signed by:  Rosiland Oz, MD Infectious Disease Physician [facility] [address] [place], [place] [address] Phone: [phone]  Fax:  [phone]

## 2020-10-16 NOTE — Progress Notes (Signed)
JAXN, CHIQUITO ([id number]) Visit Report for 10/14/2020 Arrival Information Details Patient Name: Date of Service: Karl Stevenson MES C. 10/14/2020 1:45 PM Medical Record Number: [id number] Patient Account Number: [id number] Date of Birth/Sex: Treating RN: [birth year] (87 y.o. Karl Stevenson, Karl Stevenson Primary Care Alease Fait: Karl Stevenson Other Clinician: Referring Ryn Peine: Treating Karl Stevenson/Extender: [name] in Treatment: 2 Visit Information History Since Last Visit Added or deleted any medications: No Patient Arrived: Karl Stevenson Any new allergies or adverse reactions: No Arrival Time: 13:52 Had a fall or experienced change in No Accompanied By: wife activities of daily living that may affect Transfer Assistance: None risk of falls: Patient Identification Verified: Yes Signs or symptoms of abuse/neglect since last visito No Secondary Verification Process Completed: Yes Hospitalized since last visit: No Patient Requires Transmission-Based Precautions: No Implantable device outside of the clinic excluding No Patient Has Alerts: Yes cellular tissue based products placed in the center Patient Alerts: Patient on Blood Thinner since last visit: ABI R=1.17 L=0.87 Has Dressing in Place as Prescribed: Yes TBI R=0.51 L=0.55 Pain Present Now: No Electronic Signature(s) Signed: 10/14/2020 5:37:32 PM By: Rhae Hammock RN Entered By: Rhae Hammock on 10/14/2020 13:53:05 -------------------------------------------------------------------------------- Encounter Discharge Information Details Patient Name: Date of Service: Karl Stevenson, Karl MES C. 10/14/2020 1:45 PM Medical Record Number: [id number] Patient Account Number: [id number] Date of Birth/Sex: Treating RN: 01-Dec-1926 (87 y.o. Karl Stevenson Primary Care Kaytlan Behrman: Karl Stevenson Other Clinician: Referring Bevelyn Arriola: Treating Eren Ryser/Extender: [name] in Treatment:  2 Encounter Discharge Information Items Post Procedure Vitals Discharge Condition: Stable Temperature (F): 98.1 Ambulatory Status: Walker Pulse (bpm): 78 Discharge Destination: Home Respiratory Rate (breaths/min): 17 Transportation: Private Auto Blood Pressure (mmHg): 129/68 Accompanied By: wife Schedule Follow-up Appointment: Yes Clinical Summary of Care: Notes discussed MD orders for culture, ID and podiatry referrals. wife and patient in agreement. Electronic Signature(s) Signed: 10/14/2020 5:44:33 PM By: Deon Pilling Entered By: Deon Pilling on 10/14/2020 17:41:19 -------------------------------------------------------------------------------- Lower Extremity Assessment Details Patient Name: Date of Service: Karl Stevenson MES C. 10/14/2020 1:45 PM Medical Record Number: [id number] Patient Account Number: [id number] Date of Birth/Sex: Treating RN: May 31, 1926 (87 y.o. Karl Stevenson, Karl Stevenson Primary Care Sahar Ryback: Karl Stevenson Other Clinician: Referring Delores Edelstein: Treating Troyce Gieske/Extender: [name] in Treatment: 2 Edema Assessment Assessed: [name]: No] [name]: Yes] Edema: [Left: Ye] [Right: s] Calf Left: Right: Point of Measurement: 29 cm From Medial Instep 30 cm Ankle Left: Right: Point of Measurement: 8 cm From Medial Instep 21 cm Vascular Assessment Pulses: Dorsalis Pedis Palpable: [Right:Yes] Posterior Tibial Palpable: [Right:Yes] Electronic Signature(s) Signed: 10/14/2020 5:37:32 PM By: Rhae Hammock RN Entered By: Rhae Hammock on 10/14/2020 13:53:58 -------------------------------------------------------------------------------- Multi Wound Chart Details Patient Name: Date of Service: Karl Stevenson, Karl MES C. 10/14/2020 1:45 PM Medical Record Number: [id number] Patient Account Number: [id number] Date of Birth/Sex: Treating RN: February 14, 1927 (87 y.o. Karl Stevenson Primary Care Eulia Hatcher: Karl Stevenson Other  Clinician: Referring Esgar Barnick: Treating Klohe Lovering/Extender: [name] in Treatment: 2 Vital Signs Height(in): 57 Pulse(bpm): 81 Weight(lbs): 136 Blood Pressure(mmHg): 129/68 Body Mass Index(BMI): 24 Temperature(F): 98.1 Respiratory Rate(breaths/min): 17 Photos: [1:No Photos Right, Medial Foot] [N/A:N/A N/A] Wound Location: [1:Gradually Appeared] [N/A:N/A] Wounding Event: [1:Neuropathic Ulcer-Non Diabetic] [N/A:N/A] Primary Etiology: [1:Cataracts, Chronic Obstructive] [N/A:N/A] Comorbid History: [1:Pulmonary Disease (COPD), Arrhythmia, Congestive Heart Failure, Coronary Artery Disease, Hypertension, Myocardial Infarction, Peripheral Venous Disease, Crohns 07/23/2020] [N/A:N/A] Date Acquired: [1:2] [N/A:N/A] Weeks of Treatment: [1:Open] [N/A:N/A] Wound Status: [1:1x0.8x0.3] [N/A:N/A] Measurements L x W x D (cm) [1:0.628] [  N/A:N/A] A (cm) : rea [1:0.188] [N/A:N/A] Volume (cm) : [1:46.70%] [N/A:N/A] % Reduction in Area: [1:60.10%] [N/A:N/A] % Reduction in Volume: [1:Full Thickness Without Exposed] [N/A:N/A] Classification: [1:Support Structures Medium] [N/A:N/A] Exudate Amount: [1:Serosanguineous] [N/A:N/A] Exudate Type: [1:red, brown] [N/A:N/A] Exudate Color: [1:Well defined, not attached] [N/A:N/A] Wound Margin: [1:Medium (34-66%)] [N/A:N/A] Granulation Amount: [1:Pink, Pale] [N/A:N/A] Granulation Quality: [1:Medium (34-66%)] [N/A:N/A] Necrotic Amount: [1:Fat Layer (Subcutaneous Tissue): Yes N/A] Exposed Structures: [1:Fascia: No Tendon: No Muscle: No Joint: No Bone: No Small (1-33%)] [N/A:N/A] Treatment Notes Electronic Signature(s) Signed: 10/14/2020 3:04:12 PM By: [name] DO Signed: 10/16/2020 5:57:05 PM By: [name] RN, BSN Entered By: [name] on 10/14/2020 14:51:15 -------------------------------------------------------------------------------- [place] Details Patient Name: Date of Service: Karl Stevenson, Karl MES C. 10/14/2020 1:45 PM Medical Record Number: [id number] Patient Account Number: [id number] Date of Birth/Sex: Treating RN: 01-Aug-1926 (87 y.o. Karl Stevenson Primary Care Ebba Goll: Karl Stevenson Other Clinician: Referring Cathrine Krizan: Treating Tuyet Bader/Extender: [name] in Treatment: 2 Active Inactive Wound/Skin Impairment Nursing Diagnoses: Knowledge deficit related to ulceration/compromised skin integrity Goals: Patient/caregiver will verbalize understanding of skin care regimen Date Initiated: 09/26/2020 Target Resolution Date: 10/25/2020 Goal Status: Active Interventions: Assess patient/caregiver ability to obtain necessary supplies Assess patient/caregiver ability to perform ulcer/skin care regimen upon admission and as needed Provide education on ulcer and skin care Treatment Activities: Skin care regimen initiated : 09/26/2020 Topical wound management initiated : 09/26/2020 Notes: Electronic Signature(s) Signed: 10/16/2020 5:57:05 PM By: [name] RN, BSN Entered By: [name] on 10/14/2020 14:11:14 -------------------------------------------------------------------------------- Pain Assessment Details Patient Name: Date of Service: Karl Stevenson, Karl MES C. 10/14/2020 1:45 PM Medical Record Number: [id number] Patient Account Number: [id number] Date of Birth/Sex: Treating RN: 10-Sep-1926 (87 y.o. Karl Stevenson, Karl Stevenson Primary Care Linday Rhodes: Karl Stevenson Other Clinician: Referring Atiyana Welte: Treating Avondre Richens/Extender: [name] in Treatment: 2 Active Problems Location of Pain Severity and Description of Pain Patient Has Paino No Site Locations Pain Management and Medication Current Pain Management: Electronic Signature(s) Signed: 10/14/2020 5:37:32 PM By: Rhae Hammock RN Entered By: Rhae Hammock on 10/14/2020  13:53:45 -------------------------------------------------------------------------------- Patient/Caregiver Education Details Patient Name: Date of Service: Karl Stevenson MES C. 6/6/2022andnbsp1:45 PM Medical Record Number: [id number] Patient Account Number: [id number] Date of Birth/Gender: Treating RN: 04-09-1927 (87 y.o. Karl Stevenson Primary Care Physician: Karl Stevenson Other Clinician: Referring Physician: Treating Physician/Extender: [name] in Treatment: 2 Education Assessment Education Provided To: Patient Education Topics Provided Wound/Skin Impairment: Methods: Explain/Verbal Responses: State content correctly Electronic Signature(s) Signed: 10/16/2020 5:57:05 PM By: [name] RN, BSN Entered By: [name] on 10/14/2020 14:11:25 -------------------------------------------------------------------------------- Wound Assessment Details Patient Name: Date of Service: Karl Stevenson, Karl MES C. 10/14/2020 1:45 PM Medical Record Number: [id number] Patient Account Number: [id number] Date of Birth/Sex: Treating RN: 1927-02-03 (87 y.o. Karl Stevenson, Karl Stevenson Primary Care Vernella Niznik: Karl Stevenson Other Clinician: Referring Tyree Vandruff: Treating Clyda Smyth/Extender: [name] in Treatment: 2 Wound Status Wound Number: 1 Primary Neuropathic Ulcer-Non Diabetic Etiology: Wound Location: Right, Medial Foot Wound Open Wounding Event: Gradually Appeared Status: Date Acquired: 07/23/2020 Comorbid Cataracts, Chronic Obstructive Pulmonary Disease (COPD), Weeks Of Treatment: 2 History: Arrhythmia, Congestive Heart Failure, Coronary Artery Disease, Clustered Wound: No Hypertension, Myocardial Infarction, Peripheral Venous Disease, Crohns Photos Wound Measurements Length: (cm) 1 Width: (cm) 0.8 Depth: (cm) 0.3 Area: (cm) 0.628 Volume: (cm) 0.188 % Reduction in Area: 46.7% % Reduction in Volume:  60.1% Epithelialization: Small (1-33%) Tunneling: No Undermining: No Wound Description Classification: Full Thickness Without  Exposed Support Structures Wound Margin: Well defined, not attached Exudate Amount: Medium Exudate Type: Serosanguineous Exudate Color: red, brown Foul Odor After Cleansing: No Slough/Fibrino Yes Wound Bed Granulation Amount: Medium (34-66%) Exposed Structure Granulation Quality: Pink, Pale Fascia Exposed: No Necrotic Amount: Medium (34-66%) Fat Layer (Subcutaneous Tissue) Exposed: Yes Necrotic Quality: Adherent Slough Tendon Exposed: No Muscle Exposed: No Joint Exposed: No Bone Exposed: No Treatment Notes Wound #1 (Foot) Wound Laterality: Right, Medial Cleanser [place]ram Ancillary Kit - 15 Day Supply Discharge Instruction: Use supplies as instructed; Kit contains: (15) Saline Bullets; (15) 3x3 Gauze; 15 pr Gloves Soap and Water Discharge Instruction: May shower and wash wound with dial antibacterial soap and water prior to dressing change. Peri-Wound Care Topical Primary Dressing Promogran Prisma Matrix, 4.34 (sq in) (silver collagen) Discharge Instruction: Moisten collagen with ***[place] jelly.**** Secondary Dressing Woven Gauze Sponges 2x2 in Discharge Instruction: Apply over primary dressing as directed. Optifoam Non-Adhesive Dressing, 4x4 in Discharge Instruction: ***foam donut***Apply over primary dressing as directed. Secured With Conforming Stretch Gauze Bandage, Sterile 2x75 (in/in) Discharge Instruction: Secure with stretch gauze as directed. 52M Medipore H Soft Cloth Surgical T 4 x 2 (in/yd) ape Discharge Instruction: Secure dressing with tape as directed. Compression Wrap Compression Stockings Add-Ons Electronic Signature(s) Signed: 10/15/2020 2:13:29 PM By: Sandre Kitty Signed: 10/15/2020 5:27:47 PM By: Rhae Hammock RN Previous Signature: 10/14/2020 5:37:32 PM Version By: Rhae Hammock RN Entered By: Sandre Kitty on  10/15/2020 13:40:00 -------------------------------------------------------------------------------- Vitals Details Patient Name: Date of Service: [name], Karl MES C. 10/14/2020 1:45 PM Medical Record Number: [id number] Patient Account Number: [id number] Date of Birth/Sex: Treating RN: [birth year] (87 y.o. Karl Stevenson, Karl Stevenson Primary Care Anniece Bleiler: Karl Stevenson Other Clinician: Referring Esmay Amspacher: Treating Laymond Postle/Extender: [name] in Treatment: 2 Vital Signs Time Taken: 13:53 Temperature (F): 98.1 Height (in): 63 Pulse (bpm): 78 Weight (lbs): 136 Respiratory Rate (breaths/min): 17 Body Mass Index (BMI): 24.1 Blood Pressure (mmHg): 129/68 Reference Range: 80 - 120 mg / dl Electronic Signature(s) Signed: 10/14/2020 5:37:32 PM By: Rhae Hammock RN Entered By: Rhae Hammock on 10/14/2020 13:53:35

## 2020-10-17 ENCOUNTER — Telehealth: Payer: Self-pay

## 2020-10-17 NOTE — Telephone Encounter (Signed)
Called and spoke with Karl Stevenson the patient and his son Karl Stevenson on HIPPA , regarding his appt for  the patient PICC Line on 10/22/20 at 145pm.Informed pt that his is 1st available appt. Pt due that pt having any appt on Monday with wound care and they full for the week. Pt was informed of Co pay of infusion $40 per day. Pt is agreeable to the cost.willing to have this done. Gave patient /son date and times and directions to [facility] . Pt understood .Also confirmed with Short Stay that order had been received per [place].

## 2020-10-17 NOTE — Telephone Encounter (Signed)
Called and spoke with [name] at [facility] , to inform her I have spoke with patient and son that are agreeable to $40 co pay , that appt have been made and that Short Stay has him schedule.

## 2020-10-17 NOTE — Telephone Encounter (Signed)
Pam a nurse with [facility] called back regarding home  price of antibiotic, for the Daptomycin  and Ceftiaxone IV is $40 per day, this copay with insurance his plan, she is going reach to family to see if this affordable for the patient and follow up with [place] let us know.

## 2020-10-17 NOTE — Telephone Encounter (Signed)
Called and spoke w/ IR to arrange appt for PICC Line  for patient on 10/22/20 @  1:45pm pt will need to go to [facility] Radiology Dept. And then go to Short Stay, sent order to Short stay and refax order [facility].

## 2020-10-17 NOTE — Telephone Encounter (Signed)
Lab order added to written order sheet and faxed to [facility]. Per Dr. Elinor Parkinson add  CPK to weekly labs. Valarie Cones

## 2020-10-17 NOTE — Telephone Encounter (Signed)
-----   Message from Odette Fraction, MD sent at 10/16/2020  7:08 PM EDT ----- Regarding: Labs for [facility] I want to add CPK for the weekly labs on this patient. Could you please notify Home Health ?

## 2020-10-19 LAB — AEROBIC/ANAEROBIC CULTURE W GRAM STAIN (SURGICAL/DEEP WOUND)
Culture: NORMAL
Gram Stain: NONE SEEN

## 2020-10-21 ENCOUNTER — Other Ambulatory Visit (HOSPITAL_COMMUNITY): Payer: Self-pay | Admitting: *Deleted

## 2020-10-21 ENCOUNTER — Encounter (HOSPITAL_BASED_OUTPATIENT_CLINIC_OR_DEPARTMENT_OTHER): Payer: Medicare PPO | Admitting: Internal Medicine

## 2020-10-21 ENCOUNTER — Other Ambulatory Visit: Payer: Self-pay

## 2020-10-21 DIAGNOSIS — Z95820 Peripheral vascular angioplasty status with implants and grafts: Secondary | ICD-10-CM | POA: Diagnosis not present

## 2020-10-21 DIAGNOSIS — I5022 Chronic systolic (congestive) heart failure: Secondary | ICD-10-CM | POA: Diagnosis not present

## 2020-10-21 DIAGNOSIS — L97512 Non-pressure chronic ulcer of other part of right foot with fat layer exposed: Secondary | ICD-10-CM | POA: Diagnosis not present

## 2020-10-21 DIAGNOSIS — I2510 Atherosclerotic heart disease of native coronary artery without angina pectoris: Secondary | ICD-10-CM | POA: Diagnosis not present

## 2020-10-21 DIAGNOSIS — G9009 Other idiopathic peripheral autonomic neuropathy: Secondary | ICD-10-CM | POA: Diagnosis not present

## 2020-10-21 DIAGNOSIS — M86171 Other acute osteomyelitis, right ankle and foot: Secondary | ICD-10-CM | POA: Diagnosis not present

## 2020-10-21 DIAGNOSIS — G629 Polyneuropathy, unspecified: Secondary | ICD-10-CM | POA: Diagnosis not present

## 2020-10-21 DIAGNOSIS — I110 Hypertensive heart disease with heart failure: Secondary | ICD-10-CM | POA: Diagnosis not present

## 2020-10-21 DIAGNOSIS — I739 Peripheral vascular disease, unspecified: Secondary | ICD-10-CM | POA: Diagnosis not present

## 2020-10-21 DIAGNOSIS — L97519 Non-pressure chronic ulcer of other part of right foot with unspecified severity: Secondary | ICD-10-CM | POA: Diagnosis not present

## 2020-10-22 ENCOUNTER — Ambulatory Visit (HOSPITAL_COMMUNITY)
Admission: RE | Admit: 2020-10-22 | Discharge: 2020-10-22 | Disposition: A | Discharge status: Home / Self Care | Payer: Medicare PPO | Source: Ambulatory Visit | Attending: Infectious Diseases | Admitting: Infectious Diseases

## 2020-10-22 ENCOUNTER — Other Ambulatory Visit: Payer: Self-pay | Admitting: Infectious Diseases

## 2020-10-22 ENCOUNTER — Other Ambulatory Visit: Payer: Self-pay

## 2020-10-22 DIAGNOSIS — M869 Osteomyelitis, unspecified: Secondary | ICD-10-CM

## 2020-10-22 DIAGNOSIS — L97519 Non-pressure chronic ulcer of other part of right foot with unspecified severity: Secondary | ICD-10-CM | POA: Diagnosis not present

## 2020-10-22 DIAGNOSIS — Z452 Encounter for adjustment and management of vascular access device: Secondary | ICD-10-CM | POA: Diagnosis not present

## 2020-10-22 DIAGNOSIS — I739 Peripheral vascular disease, unspecified: Secondary | ICD-10-CM | POA: Diagnosis not present

## 2020-10-22 DIAGNOSIS — G9009 Other idiopathic peripheral autonomic neuropathy: Secondary | ICD-10-CM | POA: Diagnosis not present

## 2020-10-22 DIAGNOSIS — I5022 Chronic systolic (congestive) heart failure: Secondary | ICD-10-CM | POA: Diagnosis not present

## 2020-10-22 HISTORY — PX: IR US GUIDE VASC ACCESS RIGHT: IMG2390

## 2020-10-22 HISTORY — PX: IR FLUORO GUIDE CV LINE RIGHT: IMG2283

## 2020-10-22 MED ORDER — HEPARIN SOD (PORK) LOCK FLUSH 100 UNIT/ML IV SOLN
INTRAVENOUS | Status: AC
  Filled 2020-10-22: qty 5

## 2020-10-22 MED ORDER — HEPARIN SOD (PORK) LOCK FLUSH 100 UNIT/ML IV SOLN: 250.0000 [IU] | INTRAVENOUS | Status: DC | PRN

## 2020-10-22 MED ORDER — SODIUM CHLORIDE 0.9 % IV SOLN
500.0000 mg | Freq: Every day | INTRAVENOUS | Status: DC
  Administered 2020-10-22: 500 mg via INTRAVENOUS
  Filled 2020-10-22: qty 10

## 2020-10-22 MED ORDER — HEPARIN SOD (PORK) LOCK FLUSH 100 UNIT/ML IV SOLN
250.0000 [IU] | Freq: Every day | INTRAVENOUS | Status: DC
  Administered 2020-10-22: 250 [IU]

## 2020-10-22 MED ORDER — LIDOCAINE HCL (PF) 1 % IJ SOLN
INTRAMUSCULAR | Status: AC
  Administered 2020-10-22: 2 mL via SUBCUTANEOUS
  Filled 2020-10-22: qty 30

## 2020-10-22 MED ORDER — SODIUM CHLORIDE 0.9 % IV SOLN
2.0000 g | Freq: Once | INTRAVENOUS | Status: AC
  Administered 2020-10-22: 2 g via INTRAVENOUS
  Filled 2020-10-22: qty 20

## 2020-10-22 MED ORDER — LIDOCAINE HCL (PF) 1 % IJ SOLN
INTRAMUSCULAR | Status: AC
  Filled 2020-10-22: qty 30

## 2020-10-22 NOTE — Progress Notes (Signed)
SAAHAS, HIDROGO ([id number]) Visit Report for 10/21/2020 HPI Details Patient Name: Date of Service: BA ILIFFGreggory Brandy MES C. 10/21/2020 1:45 PM Medical Record Number: [id number] Patient Account Number: [id number] Date of Birth/Sex: Treating RN: 04/05/1927 (87 y.o. [name] Primary Care Provider: [name] Other Clinician: Referring Provider: Treating Provider/Extender: Kearney Hard in Treatment: 3 History of Present Illness HPI Description: Mr. Conlin Brahm is a 87 year old male with a past medical history of peripheral vascular disease, coronary artery disease, chronic systolic heart failure, and peripheral neuropathy that presents to our clinic for a 71-monthhistory of right foot wound. He thinks that the wound started by Rubbing against his shoes. He states he was evaluated by his primary care physician who obtained an x-ray of the right foot. They were sent to vein and vascular for further assessment of the blood flow to the lower extremities. He had an aortogram on 09/05/2020 and had a right posterior tibial artery angioplasty. He states that the wound has been stable in appearance and size since the procedure. He has been using Bactroban and covering the area with a Band-Aid. He is using soft bedroom shoes. He denies acute signs of infection. 5/26; patient presents for 1 week follow-up. He has been using collagen to the wound with dressing changes. He has no complaints and denies acute signs of infection today. He is scheduled for an MRI on 6/2 6/6; patient presents for 1 week follow-up. He has been using collagen to the wound. He has noticed some increased redness to the wound site. He obtained his MRI. 6/13; this is a patient with a wound on the medial aspect of his right first metatarsal head. He had an MRI on 6/2 that suggested a wound along the medial aspect the first metatarsal head extending to bone with underlying osteomyelitis. Bone culture  that was done did not show specific pathogens. The biopsies suggested chronic active inflammation and necrosis without malignancy identified presumably osteomyelitis. The patient saw Dr. MWest Balion 6/8. He is to started on daptomycin and ceftriaxone he is getting a PICC line tomorrow. He has been on doxycycline and Augmentin but apparently stopped these because of diarrhea. [facility] also [facility] for [facility] opinions. He is using silver collagen on the wound. The patient is already been revascularized in late April with a right posterior tibial angioplasty Electronic Signature(s) Signed: 10/21/2020 5:24:16 PM By: RLinton HamMD Entered By: RLinton Hamon 10/21/2020 15:35:14 -------------------------------------------------------------------------------- Physical Exam Details Patient Name: Date of Service: BMilford Cage [name] 10/21/2020 1:45 PM Medical Record Number: [id number] Account Number: [id number] of Birth/Sex: Treating RN: [birth year](87y.o. MErnestene MentionPrimary Care Provider: PLeanna BattlesOther Clinician: Referring Provider: Treating Provider/Extender: RKearney Hardin Treatment: 3 Constitutional Sitting or standing Blood Pressure is within target range for patient.. Pulse regular and within target range for patient..Marland KitchenRespirations regular, non-labored and within target range.. Temperature is normal and within the target range for the patient..Marland KitchenAppears in no distress. Notes Wound exam; right foot medial aspect of the first metatarsal head. This has a pleasing rim of epithelialization however protruding nonviable tissue the surface of this does not look particularly healthy and with probing the wound goes wound goes deeply. I did not probe this to bone but I have no doubt that that would happen. Surrounding erythema and swelling is concerning. Electronic Signature(s) Signed: 10/21/2020 5:24:16 PM By:  RLinton HamMD Entered By: RLinton Ham  on 10/21/2020 15:34:15 -------------------------------------------------------------------------------- Physician Orders Details Patient Name: Date of Service: [name] 10/21/2020 1:45 PM Medical Record Number: [id number] Patient Account Number: [id number] Date of Birth/Sex: Treating RN: 1927/02/18 (87 y.o. [name] Primary Care Provider: [name] Other Clinician: Referring Provider: Treating Provider/Extender: Kearney Hard in Treatment: 3 Verbal / Phone Orders: No Diagnosis Coding ICD-10 Coding Code Description L97.519 Non-pressure chronic ulcer of other part of right foot with unspecified severity I73.9 Peripheral vascular disease, unspecified G90.09 Other idiopathic peripheral autonomic neuropathy O84.16 Chronic systolic (congestive) heart failure I25.119 Atherosclerotic heart disease of native coronary artery with unspecified angina pectoris M86.171 Other acute osteomyelitis, right ankle and foot Follow-up Appointments ppointment in 1 week. - with Dr. Heber [place] Return A Bathing/ Shower/ Hygiene May shower and wash wound with soap and water. - with dressing changes only. Edema Control - Lymphedema / SCD / Other Elevate legs to the level of the heart or above for 30 minutes daily and/or when sitting, a frequency of: - 3-4 times throughout the day. Avoid standing for long periods of time. Moisturize legs daily. - every night before bed. Off-Loading Open toe surgical shoe to: - both feet wear while walking. Wound Treatment Wound #1 - Foot Wound Laterality: Right, Medial Cleanser: Soap and Water Every Other Day/15 Days Discharge Instructions: May shower and wash wound with dial antibacterial soap and water prior to dressing change. Cleanser: Byram Ancillary Kit - 15 Day Supply (Generic) Every Other Day/15 Days Discharge Instructions: Use supplies as instructed; Kit contains:  (15) Saline Bullets; (15) 3x3 Gauze; 15 pr Gloves Prim Dressing: KerraCel Ag Gelling Fiber Dressing, 4x5 in (silver alginate) Every Other Day/15 Days ary Discharge Instructions: Apply silver alginate to wound bed as instructed Secondary Dressing: Woven Gauze Sponges 2x2 in (Generic) Every Other Day/15 Days Discharge Instructions: Apply over primary dressing as directed. Secondary Dressing: Optifoam Non-Adhesive Dressing, 4x4 in (Generic) Every Other Day/15 Days Discharge Instructions: ***foam donut***Apply over primary dressing as directed. Secured With: Child psychotherapist, Sterile 2x75 (in/in) (Generic) Every Other Day/15 Days Discharge Instructions: Secure with stretch gauze as directed. Secured With: 26M Medipore H Soft Cloth Surgical T 4 x 2 (in/yd) (Generic) Every Other Day/15 Days ape Discharge Instructions: Secure dressing with tape as directed. Electronic Signature(s) Signed: 10/21/2020 5:24:16 PM By: Linton Ham MD Signed: 10/22/2020 6:10:01 PM By: Baruch Gouty RN, BSN Entered By: Baruch Gouty on 10/21/2020 14:51:09 -------------------------------------------------------------------------------- Problem List Details Patient Name: Date of Service: [name], [name] 10/21/2020 1:45 PM Medical Record Number: [id number] Patient Account Number: [id number] Date of Birth/Sex: Treating RN: [birth year] (87 y.o. [name] Primary Care Provider: [name] Other Clinician: Referring Provider: Treating Provider/Extender: Kearney Hard in Treatment: 3 Active Problems ICD-10 Encounter Code Description Active Date MDM Diagnosis L97.519 Non-pressure chronic ulcer of other part of right foot with unspecified severity 09/26/2020 No Yes I73.9 Peripheral vascular disease, unspecified 09/26/2020 No Yes G90.09 Other idiopathic peripheral autonomic neuropathy 09/26/2020 No Yes U93.23 Chronic systolic (congestive) heart failure  09/26/2020 No Yes I25.119 Atherosclerotic heart disease of native coronary artery with unspecified angina 09/26/2020 No Yes pectoris M86.171 Other acute osteomyelitis, right ankle and foot 10/14/2020 No Yes Inactive Problems Resolved Problems Electronic Signature(s) Signed: 10/21/2020 5:24:16 PM By: Linton Ham MD Entered By: Linton Ham on 10/21/2020 15:29:34 -------------------------------------------------------------------------------- Progress Note Details Patient Name: Date of Service: [name], [name] 10/21/2020 1:45 PM Medical Record Number: [id number] Patient Account Number: [id number] Date of Birth/Sex:  Treating RN: 1927/05/02 (87 y.o. [name] Primary Care Provider: [name] Other Clinician: Referring Provider: Treating Provider/Extender: Kearney Hard in Treatment: 3 Subjective History of Present Illness (HPI) Mr. Slyvester Latona is a 87 year old male with a past medical history of peripheral vascular disease, coronary artery disease, chronic systolic heart failure, and peripheral neuropathy that presents to our clinic for a 71-monthhistory of right foot wound. He thinks that the wound started by Rubbing against his shoes. He states he was evaluated by his primary care physician who obtained an x-ray of the right foot. They were sent to vein and vascular for further assessment of the blood flow to the lower extremities. He had an aortogram on 09/05/2020 and had a right posterior tibial artery angioplasty. He states that the wound has been stable in appearance and size since the procedure. He has been using Bactroban and covering the area with a Band-Aid. He is using soft bedroom shoes. He denies acute signs of infection. 5/26; patient presents for 1 week follow-up. He has been using collagen to the wound with dressing changes. He has no complaints and denies acute signs of infection today. He is scheduled for an MRI on 6/2 6/6;  patient presents for 1 week follow-up. He has been using collagen to the wound. He has noticed some increased redness to the wound site. He obtained his MRI. 6/13; this is a patient with a wound on the medial aspect of his right first metatarsal head. He had an MRI on 6/2 that suggested a wound along the medial aspect the first metatarsal head extending to bone with underlying osteomyelitis. Bone culture that was done did not show specific pathogens. The biopsies suggested chronic active inflammation and necrosis without malignancy identified presumably osteomyelitis. The patient saw Dr. MWest Balion 6/8. He is to started on daptomycin and ceftriaxone he is getting a PICC line tomorrow. He has been on doxycycline and Augmentin but apparently stopped these because of diarrhea. [facility] also [facility] for [facility] opinions. He is using silver collagen on the wound. The patient is already been revascularized in late April with a right posterior tibial angioplasty Objective Constitutional Sitting or standing Blood Pressure is within target range for patient.. Pulse regular and within target range for patient..Marland KitchenRespirations regular, non-labored and within target range.. Temperature is normal and within the target range for the patient..Marland KitchenAppears in no distress. Vitals Time Taken: 1:53 PM, Height: 63 in, Weight: 136 lbs, BMI: 24.1, Temperature: 98.3 F, Pulse: 91 bpm, Respiratory Rate: 17 breaths/min, Blood Pressure: 111/67 mmHg. General Notes: Wound exam; right foot medial aspect of the first metatarsal head. This has a pleasing rim of epithelialization however protruding nonviable tissue the surface of this does not look particularly healthy and with probing the wound goes wound goes deeply. I did not probe this to bone but I have no doubt that that would happen. Surrounding erythema and swelling is concerning. Integumentary (Hair, Skin) Wound #1 status is Open. Original cause of  wound was Gradually Appeared. The date acquired was: 07/23/2020. The wound has been in treatment 3 weeks. The wound is located on the Right,Medial Foot. The wound measures 1.3cm length x 1.2cm width x 0.6cm depth; 1.225cm^2 area and 0.735cm^3 volume. There is Fat Layer (Subcutaneous Tissue) exposed. There is no tunneling or undermining noted. There is a medium amount of serosanguineous drainage noted. The wound margin is well defined and not attached to the wound base. There is medium (  34-66%) pink, pale granulation within the wound bed. There is a medium (34- 66%) amount of necrotic tissue within the wound bed including Adherent Slough. 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 - with Dr. Heber [place] Bathing/ Shower/ Hygiene: May shower and wash wound with soap and water. - with dressing changes only. Edema Control - Lymphedema / SCD / Other: Elevate legs to the level of the heart or above for 30 minutes daily and/or when sitting, a frequency of: - 3-4 times throughout the day. Avoid standing for long periods of time. Moisturize legs daily. - every night before bed. Off-Loading: Open toe surgical shoe to: - both feet wear while walking. WOUND #1: - Foot Wound Laterality: Right, Medial Cleanser: Soap and Water Every Other Day/15 Days Discharge Instructions: May shower and wash wound with dial antibacterial soap and water prior to dressing change. Cleanser: Byram Ancillary Kit - 15 Day Supply (Generic) Every Other Day/15 Days Discharge Instructions: Use supplies as instructed; Kit contains: (15)  Saline Bullets; (15) 3x3 Gauze; 15 pr Gloves Prim Dressing: KerraCel Ag Gelling Fiber Dressing, 4x5 in (silver alginate) Every Other Day/15 Days ary Discharge Instructions: Apply silver alginate to wound bed as instructed Secondary Dressing: Woven Gauze Sponges 2x2 in (Generic) Every Other Day/15 Days Discharge Instructions: Apply over primary dressing as directed. Secondary Dressing: Optifoam Non-Adhesive Dressing, 4x4 in (Generic) Every Other Day/15 Days Discharge Instructions: ***foam donut***Apply over primary dressing as directed. Secured With: Child psychotherapist, Sterile 2x75 (in/in) (Generic) Every Other Day/15 Days Discharge Instructions: Secure with stretch gauze as directed. Secured With: 39M Medipore H Soft Cloth Surgical T 4 x 2 (in/yd) (Generic) Every Other Day/15 Days ape Discharge Instructions: Secure dressing with tape as directed. 1. For now I change the dressing to silver alginate 2. Starting on IV antibiotics tomorrow as directed by infectious disease. I believe the interpretation of the bone biopsy was osteomyelitis although the culture was negative. 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 There is also surrounding erythema and swelling the entire area is worrisome 3. He has been revascularized. I am not sure if he has had follow-up noninvasive studies 4. [facility]es [facility] Wednesday for surgical optionso Ray amputation 5. They are offloading this fairly aggressively. 6. Multitude of very good questions asked by the patient and his wife. I did my best to answer them 35 minutes in review of this patient's past medical history, face-to-face evaluation and preparation of this record Electronic Signature(s) Signed: 10/21/2020 5:24:16 PM By: Linton Ham MD Entered By: Linton Ham on 10/21/2020  15:37:22 -------------------------------------------------------------------------------- SuperBill Details Patient Name: Date of Service: [name], [name] 10/21/2020 Medical Record Number: [id number] Patient Account Number: [id number] Date of Birth/Sex: Treating RN: [birth year] (87 y.o. [name] Primary Care Provider: [name] Other Clinician: Referring Provider: Treating Provider/Extender: Kearney Hard in Treatment: 3 Diagnosis Coding ICD-10 Codes Code Description [phone] Non-pressure chronic ulcer of other part of right foot with unspecified severity I73.9 Peripheral vascular disease, unspecified G90.09 Other idiopathic peripheral autonomic neuropathy O70.96 Chronic systolic (congestive) heart failure I25.119 Atherosclerotic heart disease of native coronary artery with  unspecified angina pectoris M86.171 Other acute osteomyelitis, right ankle and foot Facility Procedures CPT4 Code: [id number] Description: 99213 - WOUND CARE VISIT-LEV 3 EST PT Modifier: Quantity: 1 Physician Procedures : CPT4 Code Description Modifier [id number] - WC PHYS LEVEL 4 - EST PT ICD-10 Diagnosis Description L97.519 Non-pressure chronic ulcer of other part of right foot with unspecified severity I73.9 Peripheral vascular disease, unspecified M86.171 Other  acute osteomyelitis, right ankle and foot Quantity: 1 Electronic Signature(s) Signed: 10/21/2020 5:24:16 PM By: Linton Ham MD Signed: 10/21/2020 5:24:16 PM By: Linton Ham MD Entered By: Linton Ham on 10/21/2020 15:38:05

## 2020-10-23 ENCOUNTER — Ambulatory Visit (INDEPENDENT_AMBULATORY_CARE_PROVIDER_SITE_OTHER): Payer: Medicare PPO | Admitting: Podiatry

## 2020-10-23 DIAGNOSIS — I252 Old myocardial infarction: Secondary | ICD-10-CM | POA: Diagnosis not present

## 2020-10-23 DIAGNOSIS — N189 Chronic kidney disease, unspecified: Secondary | ICD-10-CM | POA: Diagnosis not present

## 2020-10-23 DIAGNOSIS — M86171 Other acute osteomyelitis, right ankle and foot: Secondary | ICD-10-CM | POA: Diagnosis not present

## 2020-10-23 DIAGNOSIS — M86471 Chronic osteomyelitis with draining sinus, right ankle and foot: Secondary | ICD-10-CM

## 2020-10-23 DIAGNOSIS — I2510 Atherosclerotic heart disease of native coronary artery without angina pectoris: Secondary | ICD-10-CM | POA: Diagnosis not present

## 2020-10-23 DIAGNOSIS — L97528 Non-pressure chronic ulcer of other part of left foot with other specified severity: Secondary | ICD-10-CM | POA: Diagnosis not present

## 2020-10-23 DIAGNOSIS — M1030 Gout due to renal impairment, unspecified site: Secondary | ICD-10-CM | POA: Diagnosis not present

## 2020-10-23 DIAGNOSIS — M869 Osteomyelitis, unspecified: Secondary | ICD-10-CM | POA: Diagnosis not present

## 2020-10-23 DIAGNOSIS — I130 Hypertensive heart and chronic kidney disease with heart failure and stage 1 through stage 4 chronic kidney disease, or unspecified chronic kidney disease: Secondary | ICD-10-CM | POA: Diagnosis not present

## 2020-10-23 DIAGNOSIS — L97516 Non-pressure chronic ulcer of other part of right foot with bone involvement without evidence of necrosis: Secondary | ICD-10-CM

## 2020-10-23 DIAGNOSIS — I255 Ischemic cardiomyopathy: Secondary | ICD-10-CM | POA: Diagnosis not present

## 2020-10-23 DIAGNOSIS — I5022 Chronic systolic (congestive) heart failure: Secondary | ICD-10-CM | POA: Diagnosis not present

## 2020-10-23 DIAGNOSIS — I70221 Atherosclerosis of native arteries of extremities with rest pain, right leg: Secondary | ICD-10-CM | POA: Diagnosis not present

## 2020-10-23 NOTE — Progress Notes (Signed)
Karl, Stevenson ([id number]) Visit Report for 10/21/2020 Arrival Information Details Patient Name: Date of Service: Karl Stevenson MES C. 10/21/2020 1:45 PM Medical Record Number: [id number] Patient Account Number: [id number] Date of Birth/Sex: Treating RN: 1926-10-03 (87 y.o. [name], [name] Primary Care Bernhard Koskinen: [name] Other Clinician: Referring Ethelda Deangelo: Treating Josten Warmuth/Extender: Kearney Hard in Treatment: 3 Visit Information History Since Last Visit Added or deleted any medications: No Patient Arrived: Ambulatory Any new allergies or adverse reactions: No Arrival Time: 13:52 Had a fall or experienced change in No Accompanied By: wife activities of daily living that may affect Transfer Assistance: None risk of falls: Patient Identification Verified: Yes Signs or symptoms of abuse/neglect since last visito No Secondary Verification Process Completed: Yes Hospitalized since last visit: No Patient Requires Transmission-Based Precautions: No Implantable device outside of the clinic excluding No Patient Has Alerts: Yes cellular tissue based products placed in the center Patient Alerts: Patient on Blood Thinner since last visit: ABI R=1.17 L=0.87 Has Dressing in Place as Prescribed: Yes TBI R=0.51 L=0.55 Pain Present Now: Yes Electronic Signature(s) Signed: 10/23/2020 6:22:58 PM By: Rhae Hammock RN Entered By: Rhae Hammock on 10/21/2020 13:52:56 -------------------------------------------------------------------------------- Clinic Level of Care Assessment Details Patient Name: Date of Service: Karl Stevenson, Karl MES C. 10/21/2020 1:45 PM Medical Record Number: [id number] Patient Account Number: [id number] Date of Birth/Sex: Treating RN: 11-20-1926 (87 y.o. [name] Primary Care Leviticus Harton: [name] Other Clinician: Referring Geniyah Eischeid: Treating Shahram Alexopoulos/Extender: Kearney Hard in Treatment:  3 Clinic Level of Care Assessment Items TOOL 4 Quantity Score []  - 0 Use when only an EandM is performed on FOLLOW-UP visit ASSESSMENTS - Nursing Assessment / Reassessment X- 1 10 Reassessment of Co-morbidities (includes updates in patient status) X- 1 5 Reassessment of Adherence to Treatment Plan ASSESSMENTS - Wound and Skin A ssessment / Reassessment X - Simple Wound Assessment / Reassessment - one wound 1 5 []  - 0 Complex Wound Assessment / Reassessment - multiple wounds []  - 0 Dermatologic / Skin Assessment (not related to wound area) ASSESSMENTS - Focused Assessment X- 1 5 Circumferential Edema Measurements - multi extremities []  - 0 Nutritional Assessment / Counseling / Intervention X- 1 5 Lower Extremity Assessment (monofilament, tuning fork, pulses) []  - 0 Peripheral Arterial Disease Assessment (using hand held doppler) ASSESSMENTS - Ostomy and/or Continence Assessment and Care []  - 0 Incontinence Assessment and Management []  - 0 Ostomy Care Assessment and Management (repouching, etc.) PROCESS - Coordination of Care X - Simple Patient / Family Education for ongoing care 1 15 []  - 0 Complex (extensive) Patient / Family Education for ongoing care X- 1 10 Staff obtains Programmer, systems, Records, T Results / Process Orders est X- 1 10 Staff telephones HHA, Nursing Homes / Clarify orders / etc []  - 0 Routine Transfer to another Facility (non-emergent condition) []  - 0 Routine Hospital Admission (non-emergent condition) []  - 0 New Admissions / Biomedical engineer / Ordering NPWT Apligraf, etc. , []  - 0 Emergency Hospital Admission (emergent condition) X- 1 10 Simple Discharge Coordination []  - 0 Complex (extensive) Discharge Coordination PROCESS - Special Needs []  - 0 Pediatric / Minor Patient Management []  - 0 Isolation Patient Management []  - 0 Hearing / Language / Visual special needs []  - 0 Assessment of Community assistance (transportation, D/C  planning, etc.) []  - 0 Additional assistance / Altered mentation []  - 0 Support Surface(s) Assessment (bed, cushion, seat, etc.) INTERVENTIONS - Wound Cleansing / Measurement X - Simple Wound Cleansing -  one wound 1 5 []  - 0 Complex Wound Cleansing - multiple wounds X- 1 5 Wound Imaging (photographs - any number of wounds) []  - 0 Wound Tracing (instead of photographs) X- 1 5 Simple Wound Measurement - one wound []  - 0 Complex Wound Measurement - multiple wounds INTERVENTIONS - Wound Dressings X - Small Wound Dressing one or multiple wounds 1 10 []  - 0 Medium Wound Dressing one or multiple wounds []  - 0 Large Wound Dressing one or multiple wounds X- 1 5 Application of Medications - topical []  - 0 Application of Medications - injection INTERVENTIONS - Miscellaneous []  - 0 External ear exam []  - 0 Specimen Collection (cultures, biopsies, blood, body fluids, etc.) []  - 0 Specimen(s) / Culture(s) sent or taken to Lab for analysis []  - 0 Patient Transfer (multiple staff / Civil Service fast streamer / Similar devices) []  - 0 Simple Staple / Suture removal (25 or less) []  - 0 Complex Staple / Suture removal (26 or more) []  - 0 Hypo / Hyperglycemic Management (close monitor of Blood Glucose) []  - 0 Ankle / Brachial Index (ABI) - do not check if billed separately X- 1 5 Vital Signs Has the patient been seen at the hospital within the last three years: Yes Total Score: 110 Level Of Care: New/Established - Level 3 Electronic Signature(s) Signed: 10/22/2020 6:10:01 PM By: Baruch Gouty RN, BSN Entered By: Baruch Gouty on 10/21/2020 14:52:25 -------------------------------------------------------------------------------- Encounter Discharge Information Details Patient Name: Date of Service: Karl Stevenson, Karl MES C. 10/21/2020 1:45 PM Medical Record Number: [id number] Patient Account Number: [id number] Date of Birth/Sex: Treating RN: 1926/06/26 (87 y.o. [name] Primary Care  Asmara Backs: [name] Other Clinician: Referring Hailyn Zarr: Treating Taliana Mersereau/Extender: Kearney Hard in Treatment: 3 Encounter Discharge Information Items Discharge Condition: Stable Ambulatory Status: Walker Discharge Destination: Home Transportation: Private Auto Accompanied By: wife Schedule Follow-up Appointment: Yes Clinical Summary of Care: Electronic Signature(s) Signed: 10/21/2020 5:45:16 PM By: Deon Pilling Entered By: Deon Pilling on 10/21/2020 15:13:27 -------------------------------------------------------------------------------- Lower Extremity Assessment Details Patient Name: Date of Service: Karl Stevenson MES C. 10/21/2020 1:45 PM Medical Record Number: [id number] Patient Account Number: [id number] Date of Birth/Sex: Treating RN: 10/28/1926 (87 y.o. [name], [name] Primary Care Dalma Panchal: [name] Other Clinician: Referring Clelia Trabucco: Treating Debraann Livingstone/Extender: Kearney Hard in Treatment: 3 Edema Assessment Assessed: [name]: No] [name]: Yes] Edema: [Left: Ye] [Right: s] Calf Left: Right: Point of Measurement: 29 cm From Medial Instep 30 cm Ankle Left: Right: Point of Measurement: 8 cm From Medial Instep 21 cm Vascular Assessment Pulses: Dorsalis Pedis Palpable: [Right:Yes] Posterior Tibial Palpable: [Right:Yes] Electronic Signature(s) Signed: 10/23/2020 6:22:58 PM By: Rhae Hammock RN Entered By: Rhae Hammock on 10/21/2020 13:53:49 -------------------------------------------------------------------------------- Multi Wound Chart Details Patient Name: Date of Service: Karl Stevenson, Karl MES C. 10/21/2020 1:45 PM Medical Record Number: [id number] Patient Account Number: [id number] Date of Birth/Sex: Treating RN: [birth year] (87 y.o. [name] Primary Care Socrates Cahoon: [name] Other Clinician: Referring Jameil Whitmoyer: Treating Ericka Marcellus/Extender: Kearney Hard in Treatment: 3 Vital Signs Height(in): 45 Pulse(bpm): 15 Weight(lbs): 136 Blood Pressure(mmHg): 111/67 Body Mass Index(BMI): 24 Temperature(F): 98.3 Respiratory Rate(breaths/min): 74 Photos: [1:No Photos Right, Medial Foot] [N/A:N/A N/A] Wound Location: [1:Gradually Appeared] [N/A:N/A] Wounding Event: [1:Neuropathic Ulcer-Non Diabetic] [N/A:N/A] Primary Etiology: [1:Cataracts, Chronic Obstructive] [N/A:N/A] Comorbid History: [1:Pulmonary Disease (COPD), Arrhythmia, Congestive Heart Failure, Coronary Artery Disease, Hypertension, Myocardial Infarction, Peripheral Venous Disease, Crohns 07/23/2020] [N/A:N/A] Date Acquired: [1:3] [N/A:N/A] Weeks of Treatment: [1:Open] [N/A:N/A] Wound Status: [1:1.3x1.2x0.6] [  N/A:N/A] Measurements L x W x D (cm) [1:1.225] [N/A:N/A] A (cm) : rea [1:0.735] [N/A:N/A] Volume (cm) : [1:-4.00%] [N/A:N/A] % Reduction in Area: [1:-56.10%] [N/A:N/A] % Reduction in Volume: [1:Full Thickness Without Exposed] [N/A:N/A] Classification: [1:Support Structures Medium] [N/A:N/A] Exudate Amount: [1:Serosanguineous] [N/A:N/A] Exudate Type: [1:red, brown] [N/A:N/A] Exudate Color: [1:Well defined, not attached] [N/A:N/A] Wound Margin: [1:Medium (34-66%)] [N/A:N/A] Granulation Amount: [1:Pink, Pale] [N/A:N/A] Granulation Quality: [1:Medium (34-66%)] [N/A:N/A] Necrotic Amount: [1:Fat Layer (Subcutaneous Tissue): Yes N/A] Exposed Structures: [1:Fascia: No Tendon: No Muscle: No Joint: No Bone: No Small (1-33%)] [N/A:N/A] Treatment Notes Wound #1 (Foot) Wound Laterality: Right, Medial Cleanser Byram Ancillary Kit - 15 Day Supply Discharge Instruction: Use supplies as instructed; Kit contains: (15) Saline Bullets; (15) 3x3 Gauze; 15 pr Gloves Soap and Water Discharge Instruction: May shower and wash wound with dial antibacterial soap and water prior to dressing change. Peri-Wound Care Topical Primary Dressing KerraCel Ag Gelling Fiber Dressing, 4x5 in  (silver alginate) Discharge Instruction: Apply silver alginate to wound bed as instructed Secondary Dressing Woven Gauze Sponges 2x2 in Discharge Instruction: Apply over primary dressing as directed. Optifoam Non-Adhesive Dressing, 4x4 in Discharge Instruction: ***foam donut***Apply over primary dressing as directed. Secured With Conforming Stretch Gauze Bandage, Sterile 2x75 (in/in) Discharge Instruction: Secure with stretch gauze as directed. 31M Medipore H Soft Cloth Surgical T 4 x 2 (in/yd) ape Discharge Instruction: Secure dressing with tape as directed. Compression Wrap Compression Stockings Add-Ons Electronic Signature(s) Signed: 10/21/2020 5:24:16 PM By: Linton Ham MD Signed: 10/22/2020 6:10:01 PM By: Baruch Gouty RN, BSN Entered By: Linton Ham on 10/21/2020 15:29:43 -------------------------------------------------------------------------------- Multi-Disciplinary Care Plan Details Patient Name: Date of Service: Karl Stevenson, Karl MES C. 10/21/2020 1:45 PM Medical Record Number: [id number] Patient Account Number: [id number] Date of Birth/Sex: Treating RN: 1927-04-19 (87 y.o. [name] Primary Care Merl Guardino: [name] Other Clinician: Referring Kylah Maresh: Treating Navid Lenzen/Extender: Kearney Hard in Treatment: 3 Active Inactive Wound/Skin Impairment Nursing Diagnoses: Knowledge deficit related to ulceration/compromised skin integrity Goals: Patient/caregiver will verbalize understanding of skin care regimen Date Initiated: 09/26/2020 Target Resolution Date: 10/25/2020 Goal Status: Active Interventions: Assess patient/caregiver ability to obtain necessary supplies Assess patient/caregiver ability to perform ulcer/skin care regimen upon admission and as needed Provide education on ulcer and skin care Treatment Activities: Skin care regimen initiated : 09/26/2020 Topical wound management initiated :  09/26/2020 Notes: Electronic Signature(s) Signed: 10/22/2020 6:10:01 PM By: Baruch Gouty RN, BSN Entered By: Baruch Gouty on 10/21/2020 14:43:26 -------------------------------------------------------------------------------- Pain Assessment Details Patient Name: Date of Service: Karl Stevenson, Karl MES C. 10/21/2020 1:45 PM Medical Record Number: [id number] Patient Account Number: [id number] Date of Birth/Sex: Treating RN: 06/23/1926 (87 y.o. [name], [name] Primary Care Bijan Ridgley: [name] Other Clinician: Referring Orazio Weller: Treating Adalena Abdulla/Extender: Kearney Hard in Treatment: 3 Active Problems Location of Pain Severity and Description of Pain Patient Has Paino Yes Site Locations Pain Location: Pain in Ulcers With Dressing Change: Yes Duration of the Pain. Constant / Intermittento Intermittent Rate the pain. Current Pain Level: 3 Worst Pain Level: 10 Least Pain Level: 0 Tolerable Pain Level: 3 Character of Pain Describe the Pain: Aching Pain Management and Medication Current Pain Management: Medication: Yes Cold Application: No Rest: Yes Massage: No Activity: No T.E.N.S.: No Heat Application: No Leg drop or elevation: No Is the Current Pain Management Adequate: Adequate How does your wound impact your activities of daily livingo Sleep: No Bathing: No Appetite: No Relationship With Others: No Bladder Continence: No Emotions: No Bowel Continence: No Work: No  Toileting: No Drive: No Dressing: No Hobbies: No Electronic Signature(s) Signed: 10/23/2020 6:22:58 PM By: Rhae Hammock RN Entered By: Rhae Hammock on 10/21/2020 13:53:42 -------------------------------------------------------------------------------- Patient/Caregiver Education Details Patient Name: Date of Service: Karl Stevenson MES C. 6/13/2022andnbsp1:45 PM Medical Record Number: [id number] Patient Account Number: [id number] Date of  Birth/Gender: Treating RN: February 12, 1927 (87 y.o. [name] Primary Care Physician: [name] Other Clinician: Referring Physician: Treating Physician/Extender: Kearney Hard in Treatment: 3 Education Assessment Education Provided To: Patient Education Topics Provided Wound/Skin Impairment: Methods: Explain/Verbal Responses: Reinforcements needed, State content correctly Electronic Signature(s) Signed: 10/22/2020 6:10:01 PM By: Baruch Gouty RN, BSN Entered By: Baruch Gouty on 10/21/2020 14:43:45 -------------------------------------------------------------------------------- Wound Assessment Details Patient Name: Date of Service: Karl Stevenson, Karl MES C. 10/21/2020 1:45 PM Medical Record Number: [id number] Patient Account Number: [id number] Date of Birth/Sex: Treating RN: [birth year] (87 y.o. [name], [name] Primary Care Devera Englander: [name] Other Clinician: Referring Shaughn Thomley: Treating Jyair Kiraly/Extender: Kearney Hard in Treatment: 3 Wound Status Wound Number: 1 Primary Neuropathic Ulcer-Non Diabetic Etiology: Wound Location: Right, Medial Foot Wound Open Wounding Event: Gradually Appeared Status: Date Acquired: 07/23/2020 Comorbid Cataracts, Chronic Obstructive Pulmonary Disease (COPD), Weeks Of Treatment: 3 History: Arrhythmia, Congestive Heart Failure, Coronary Artery Disease, Clustered Wound: No Hypertension, Myocardial Infarction, Peripheral Venous Disease, Crohns Photos Wound Measurements Length: (cm) 1.3 Width: (cm) 1.2 Depth: (cm) 0.6 Area: (cm) 1.225 Volume: (cm) 0.735 % Reduction in Area: -4% % Reduction in Volume: -56.1% Epithelialization: Small (1-33%) Tunneling: No Undermining: No Wound Description Classification: Full Thickness Without Exposed Support Structures Wound Margin: Well defined, not attached Exudate Amount: Medium Exudate Type: Serosanguineous Exudate  Color: red, brown Foul Odor After Cleansing: No Slough/Fibrino Yes Wound Bed Granulation Amount: Medium (34-66%) Exposed Structure Granulation Quality: Pink, Pale Fascia Exposed: No Necrotic Amount: Medium (34-66%) Fat Layer (Subcutaneous Tissue) Exposed: Yes Necrotic Quality: Adherent Slough Tendon Exposed: No Muscle Exposed: No Joint Exposed: No Bone Exposed: No Treatment Notes Wound #1 (Foot) Wound Laterality: Right, Medial Cleanser Byram Ancillary Kit - 15 Day Supply Discharge Instruction: Use supplies as instructed; Kit contains: (15) Saline Bullets; (15) 3x3 Gauze; 15 pr Gloves Soap and Water Discharge Instruction: May shower and wash wound with dial antibacterial soap and water prior to dressing change. Peri-Wound Care Topical Primary Dressing KerraCel Ag Gelling Fiber Dressing, 4x5 in (silver alginate) Discharge Instruction: Apply silver alginate to wound bed as instructed Secondary Dressing Woven Gauze Sponges 2x2 in Discharge Instruction: Apply over primary dressing as directed. Optifoam Non-Adhesive Dressing, 4x4 in Discharge Instruction: ***foam donut***Apply over primary dressing as directed. Secured With Conforming Stretch Gauze Bandage, Sterile 2x75 (in/in) Discharge Instruction: Secure with stretch gauze as directed. 93M Medipore H Soft Cloth Surgical T 4 x 2 (in/yd) ape Discharge Instruction: Secure dressing with tape as directed. Compression Wrap Compression Stockings Add-Ons Electronic Signature(s) Signed: 10/23/2020 1:43:18 PM By: Sandre Kitty Signed: 10/23/2020 6:22:58 PM By: Rhae Hammock RN Entered By: Sandre Kitty on 10/22/2020 12:35:37 -------------------------------------------------------------------------------- Vitals Details Patient Name: Date of Service: Karl Stevenson, Karl MES C. 10/21/2020 1:45 PM Medical Record Number: [id number] Patient Account Number: [id number] Date of Birth/Sex: Treating RN: 03-Sep-1926 (87 y.o. [name],  [name] Primary Care Annaka Cleaver: Other Clinician: [name] Referring Jaylani Mcguinn: Treating Haydan Wedig/Extender: Kearney Hard in Treatment: 3 Vital Signs Time Taken: 13:53 Temperature (F): 98.3 Height (in): 63 Pulse (bpm): 91 Weight (lbs): 136 Respiratory Rate (breaths/min): 17 Body Mass Index (BMI): 24.1 Blood Pressure (mmHg): 111/67 Reference Range: 80 - 120 mg /  dl Electronic Signature(s) Signed: 10/23/2020 6:22:58 PM By: Rhae Hammock RN Entered By: Rhae Hammock on 10/21/2020 13:53:18

## 2020-10-24 DIAGNOSIS — I2510 Atherosclerotic heart disease of native coronary artery without angina pectoris: Secondary | ICD-10-CM | POA: Diagnosis not present

## 2020-10-24 DIAGNOSIS — L97528 Non-pressure chronic ulcer of other part of left foot with other specified severity: Secondary | ICD-10-CM | POA: Diagnosis not present

## 2020-10-24 DIAGNOSIS — I5022 Chronic systolic (congestive) heart failure: Secondary | ICD-10-CM | POA: Diagnosis not present

## 2020-10-24 DIAGNOSIS — N189 Chronic kidney disease, unspecified: Secondary | ICD-10-CM | POA: Diagnosis not present

## 2020-10-24 DIAGNOSIS — M869 Osteomyelitis, unspecified: Secondary | ICD-10-CM | POA: Diagnosis not present

## 2020-10-24 DIAGNOSIS — I70221 Atherosclerosis of native arteries of extremities with rest pain, right leg: Secondary | ICD-10-CM | POA: Diagnosis not present

## 2020-10-24 DIAGNOSIS — I130 Hypertensive heart and chronic kidney disease with heart failure and stage 1 through stage 4 chronic kidney disease, or unspecified chronic kidney disease: Secondary | ICD-10-CM | POA: Diagnosis not present

## 2020-10-24 DIAGNOSIS — I255 Ischemic cardiomyopathy: Secondary | ICD-10-CM | POA: Diagnosis not present

## 2020-10-24 DIAGNOSIS — I252 Old myocardial infarction: Secondary | ICD-10-CM | POA: Diagnosis not present

## 2020-10-25 ENCOUNTER — Encounter: Payer: Self-pay | Admitting: Podiatry

## 2020-10-25 NOTE — Progress Notes (Signed)
Subjective:  Patient ID: Karl Stevenson, male    DOB: 1927/03/03,  MRN: [id number]  Chief Complaint  Patient presents with   Wound Check    Right foot     87 y.o. male presents for wound care.  Patient presents with complaint of right first metatarsophalangeal joint wound with underlying osteomyelitis.  Patient had an MRI done by his primary care physician which showed osteomyelitis and was immediately referred over here for further intervention.  Patient has significant vascular history is also being followed up at the wound care center.  Patient is arm PICC line for per infectious disease.  He was referred here for possible consultation for amputation.  He denies any other acute complaints.  He does not want amputation.  He would like to discuss all the options he has available.  He has been revascularized with right posterior tibial angioplasty and is the only flow to the right foot.   Review of Systems: Negative except as noted in the HPI. Denies N/V/F/Ch.  Past Medical History:  Diagnosis Date   AAA (abdominal aortic aneurysm) (HCC)    a. Ectatic abdominal aorta with fusiform dilitation 3.4x3.4 and moderate thrombus burden 12/2010   Bradycardia    a. previous intolerance to beta blockers - low dose toprol started 10/2011 for tachycardia but had rash and was discontinued;  b. tolerating low dose propranolol.   Carotid artery occlusion    a. s/p L CEA in 2010;  b. 06/2014 Carotid U/S: stable LICA CEA site w/o significant stenosis bilat.   Chronic systolic heart failure (HCC)    a. 02/2015 Echo:  35-40% (echo 11/2010); c. 02/2015 Echo: EF 20-25%, diff HK, mid-apicalanteroseptal and apical AK, Gr 1 DD, mild MR.   COPD (chronic obstructive pulmonary disease) (HCC)    Coronary artery disease    a. 1969 s/p MI;  b. S/P CABG 1987;  c. S/P redo 2008;  d. 07/2012 Cath: stable->Med Rx, EF 40%; e. 02/2014 MV: anterior and posterolateral/apical infarct with mild peri-infarct ischemia->Med Rx; f.  02/2015 Cath: LM 100d, LAD 100ost, LCX 100ost, OM2 100, VG->OM3->OM4 nl, VG-> LAD nl (Y from VG->OM), VG->OM2 100p.   Crohn's disease (HCC)    a. s/p colon resection   Gout    Hyperlipidemia    Hypertension    Ischemic cardiomyopathy    a. EF 34% (Lexiscan 06/2011); b. 35-40% (echo 11/2010); c. 02/2015 Echo: EF 20-25%, diff HK, mid-apicalanteroseptal and apical AK, Gr 1 DD, mild MR.   Peripheral vascular disease (HCC)    a. Carotid dz s/p L CEA, RAS, AAA, LE dz   RBBB    Renal artery stenosis (HCC)    a. Dopplers 12/2010 - 1-59% R ostial renal artery stenosis, 2 L renal arteries both widely patent   Syncope    Tachycardia    a. 10/2011 - atrial tach vs avnrt - BB started.   Ventricular tachycardia (HCC)    a. 02/2015 ->amio 200 daily added-->later switched to mexilitene.    Current Outpatient Medications:    acetaminophen (TYLENOL) 500 MG tablet, Take 1,000 mg by mouth every 6 (six) hours as needed (pain). , Disp: , Rfl:    amoxicillin-clavulanate (AUGMENTIN) 500-125 MG tablet, Take 1 tablet (500 mg total) by mouth 2 (two) times daily., Disp: 28 tablet, Rfl: 0   aspirin EC 81 MG tablet, Take 81 mg by mouth daily., Disp: , Rfl:    atorvastatin (LIPITOR) 10 MG tablet, Take 10 mg by mouth daily., Disp: , Rfl:  carboxymethylcellulose (REFRESH PLUS) 0.5 % SOLN, Place 1 drop into both eyes 4 (four) times daily., Disp: , Rfl:    cholecalciferol (VITAMIN D3) 25 MCG (1000 UNIT) tablet, Take 1,000 Units by mouth daily., Disp: , Rfl:    clopidogrel (PLAVIX) 75 MG tablet, TAKE 1 TABLET EVERY DAY (Patient taking differently: Take 75 mg by mouth daily.), Disp: 90 tablet, Rfl: 3   fexofenadine (ALLEGRA) 180 MG tablet, Take 180 mg by mouth daily., Disp: , Rfl:    ipratropium (ATROVENT) 0.03 % nasal spray, Place 1 spray into both nostrils daily., Disp: , Rfl:    isosorbide mononitrate (IMDUR) 120 MG 24 hr tablet, Take 120 mg by mouth daily., Disp: , Rfl:    isosorbide mononitrate (IMDUR) 60 MG 24 hr  tablet, Take 60 mg by mouth daily., Disp: , Rfl:    losartan (COZAAR) 25 MG tablet, TAKE 1 TABLET TWICE DAILY (Patient taking differently: Take 25 mg by mouth in the morning and at bedtime.), Disp: 180 tablet, Rfl: 2   mexiletine (MEXITIL) 200 MG capsule, Take 1 capsule (200 mg total) by mouth every 12 (twelve) hours., Disp: 180 capsule, Rfl: 2   Multiple Vitamin (MULTIVITAMIN WITH MINERALS) TABS tablet, Take 2 tablets by mouth daily., Disp: , Rfl:    mupirocin ointment (BACTROBAN) 2 %, Apply 1 application topically 2 (two) times daily. Apply to foot, Disp: , Rfl:    nitroGLYCERIN (NITROSTAT) 0.4 MG SL tablet, Place 1 tablet (0.4 mg total) under the tongue every 5 (five) minutes as needed for chest pain., Disp: 25 tablet, Rfl: 3   OVER THE COUNTER MEDICATION, Take 1 capsule by mouth daily. Nerve Health, Disp: , Rfl:    pantoprazole (PROTONIX) 40 MG tablet, TAKE 1 TABLET EVERY DAY (Patient taking differently: Take 40 mg by mouth daily.), Disp: 90 tablet, Rfl: 3   Polyethyl Glycol-Propyl Glycol (SYSTANE) 0.4-0.3 % SOLN, Place 1 drop into both eyes in the morning and at bedtime., Disp: , Rfl:    polyethylene glycol powder (GLYCOLAX/MIRALAX) powder, Take 17 g by mouth at bedtime. Mix in 8 oz liquid and drink, Disp: , Rfl:    ranolazine (RANEXA) 500 MG 12 hr tablet, TAKE 2 TABLETS TWICE DAILY, Disp: 360 tablet, Rfl: 3   triamcinolone ointment (KENALOG) 0.1 %, Apply 1 application topically 2 (two) times daily. Mix with Mupirocin and apply to head, Disp: , Rfl:    vitamin B-12 (CYANOCOBALAMIN) 1000 MCG tablet, Take 2,000 mcg by mouth daily., Disp: , Rfl:   Social History   Tobacco Use  Smoking Status Never  Smokeless Tobacco Never    Allergies  Allergen Reactions   Amoxicillin     diaharrea   Novocain [Procaine Hcl] Other (See Comments)    Cold sweats and very bad headache.    Procaine Other (See Comments)    Sweating headache   Metoprolol Hives, Itching and Rash   Objective:  There were no  vitals filed for this visit. There is no height or weight on file to calculate BMI. Constitutional Well developed. Well nourished.  Vascular Dorsalis pedis pulses palpable bilaterally. Posterior tibial pulses palpable bilaterally. Capillary refill normal to all digits.  No cyanosis or clubbing noted. Pedal hair growth normal.  Neurologic Normal speech. Oriented to person, place, and time. Protective sensation absent  Dermatologic Wound Location: Right first MPJ wound with underlying osteomyelitis.  Probes down to bone.  No cellulitis noted.  No purulent drainage expressed.  Bone is clinically palpated.  No malodor present Wound Base: Mixed  Granular/Fibrotic Peri-wound: Calloused Exudate: Scant/small amount Serous exudate Wound Measurements: -See below  Orthopedic: No pain to palpation either foot.   Radiographs: MRI was reviewed  Ulcer IMPRESSION: 1. Ulceration and small sinus tract along the medial aspect of the first metatarsal head, extending to bone, with underlying osteomyelitis. No abscess. Assessment:   1. Chronic osteomyelitis of right foot with draining sinus (HCC)   2. Ulcer of right foot with bone involvement without evidence of necrosis [facility])    Plan:  Patient was evaluated and treated and all questions answered.  Right first metatarsophalangeal joint wound medial aspect with underlying osteomyelitis stable -Wound primarily managed per wound care center -I reviewed the chart it appears that patient has 1 dominant artery to the flow via the posterior tibial artery.  It appears that this may be adequate enough flow for patient to undergo as surgical amputation.  I discussed my surgical planning in extensive detail with the patient.  However given his age with underlying history of vascular disease he is not an ideal candidate for surgery.  Nevertheless, I discussed with the patient his options which include partial first ray amputation versus internal amputation with the  head of the first metatarsal and base of the proximal phalanx with multiple bone biopsies.  I did discuss with him that he may lose function of the big toe joint if I do any internal amputation.  I also discussed all the risk and benefits including further amputation given his history of vascular disease with concern for not healing. -Continue PICC line IV antibiotics per infectious disease. -He would like to discuss all of his surgical options at this time and I will reevaluate him in 3 weeks. -Weightbearing as tolerated with a surgical shoe. -Continue local wound care per wound care center.  No follow-ups on file.

## 2020-10-27 ENCOUNTER — Other Ambulatory Visit: Payer: Self-pay | Admitting: Cardiovascular Disease

## 2020-10-28 ENCOUNTER — Other Ambulatory Visit: Payer: Self-pay

## 2020-10-28 ENCOUNTER — Encounter (HOSPITAL_BASED_OUTPATIENT_CLINIC_OR_DEPARTMENT_OTHER): Payer: Medicare PPO | Admitting: Internal Medicine

## 2020-10-28 DIAGNOSIS — I739 Peripheral vascular disease, unspecified: Secondary | ICD-10-CM | POA: Diagnosis not present

## 2020-10-28 DIAGNOSIS — M86171 Other acute osteomyelitis, right ankle and foot: Secondary | ICD-10-CM | POA: Diagnosis not present

## 2020-10-28 DIAGNOSIS — I110 Hypertensive heart disease with heart failure: Secondary | ICD-10-CM | POA: Diagnosis not present

## 2020-10-28 DIAGNOSIS — L97519 Non-pressure chronic ulcer of other part of right foot with unspecified severity: Secondary | ICD-10-CM | POA: Diagnosis not present

## 2020-10-28 DIAGNOSIS — G629 Polyneuropathy, unspecified: Secondary | ICD-10-CM | POA: Diagnosis not present

## 2020-10-28 DIAGNOSIS — I2510 Atherosclerotic heart disease of native coronary artery without angina pectoris: Secondary | ICD-10-CM | POA: Diagnosis not present

## 2020-10-28 DIAGNOSIS — G9009 Other idiopathic peripheral autonomic neuropathy: Secondary | ICD-10-CM | POA: Diagnosis not present

## 2020-10-28 DIAGNOSIS — I5022 Chronic systolic (congestive) heart failure: Secondary | ICD-10-CM | POA: Diagnosis not present

## 2020-10-28 DIAGNOSIS — Z95820 Peripheral vascular angioplasty status with implants and grafts: Secondary | ICD-10-CM | POA: Diagnosis not present

## 2020-10-28 NOTE — Progress Notes (Signed)
BRAYLON, GRENDA ([id number]) Visit Report for 10/28/2020 Chief Complaint Document Details Patient Name: Date of Service: Karl Stevenson MES C. 10/28/2020 2:00 PM Medical Record Number: [id number] Patient Account Number: [id number] Date of Birth/Sex: Treating RN: July 02, 1926 (87 y.o. [name] Primary Care Provider: [name] Other Clinician: Referring Provider: Treating Provider/Extender: [name] in Treatment: 4 Information Obtained from: Patient Chief Complaint Right foot wound Electronic Signature(s) Signed: 10/28/2020 4:57:39 PM By: [name] DO Entered By: [name] on 10/28/2020 16:48:54 -------------------------------------------------------------------------------- Debridement Details Patient Name: Date of Service: Karl Stevenson, Karl MES C. 10/28/2020 2:00 PM Medical Record Number: [id number] Patient Account Number: [id number] Date of Birth/Sex: Treating RN: Apr 12, 1927 (87 y.o. [name], [name] Primary Care Provider: [name] Other Clinician: Referring Provider: Treating Provider/Extender: [name] in Treatment: 4 Debridement Performed for Assessment: Wound #1 Right,Medial Foot Performed By: Physician [name], DO Debridement Type: Debridement Level of Consciousness (Pre-procedure): Awake and Alert Pre-procedure Verification/Time Out Yes - 15:08 Taken: Start Time: 15:09 Pain Control: Lidocaine 4% T opical Solution T Area Debrided (L x W): otal 1.3 (cm) x 1.5 (cm) = 1.95 (cm) Tissue and other material debrided: Viable, Non-Viable, Slough, Subcutaneous, Skin: Dermis , Skin: Epidermis, Fibrin/Exudate, Slough Level: Skin/Subcutaneous Tissue Debridement Description: Excisional Instrument: Curette Bleeding: Minimum Hemostasis Achieved: Pressure End Time: 15:13 Procedural Pain: 0 Post Procedural Pain: 0 Response to Treatment: Procedure was tolerated well Level of  Consciousness (Post- Awake and Alert procedure): Post Debridement Measurements of Total Wound Length: (cm) 1.3 Width: (cm) 1.5 Depth: (cm) 0.5 Volume: (cm) 0.766 Character of Wound/Ulcer Post Debridement: Requires Further Debridement Post Procedure Diagnosis Same as Pre-procedure Electronic Signature(s) Signed: 10/28/2020 4:57:39 PM By: [name] DO Signed: 10/28/2020 6:33:53 PM By: Deon Pilling Entered By: Deon Pilling on 10/28/2020 15:13:15 -------------------------------------------------------------------------------- HPI Details Patient Name: Date of Service: Karl Stevenson, Karl MES C. 10/28/2020 2:00 PM Medical Record Number: [id number] Patient Account Number: [id number] Date of Birth/Sex: Treating RN: [birth year] (87 y.o. [name] Primary Care Provider: [name] Other Clinician: Referring Provider: Treating Provider/Extender: [name] in Treatment: 4 History of Present Illness HPI Description: Mr. Carver Murakami is a 87 year old male with a past medical history of peripheral vascular disease, coronary artery disease, chronic systolic heart failure, and peripheral neuropathy that presents to our clinic for a 56-monthhistory of right foot wound. He thinks that the wound started by Rubbing against his shoes. He states he was evaluated by his primary care physician who obtained an x-ray of the right foot. They were sent to vein and vascular for further assessment of the blood flow to the lower extremities. He had an aortogram on 09/05/2020 and had a right posterior tibial artery angioplasty. He states that the wound has been stable in appearance and size since the procedure. He has been using Bactroban and covering the area with a Band-Aid. He is using soft bedroom shoes. He denies acute signs of infection. 5/26; patient presents for 1 week follow-up. He has been using collagen to the wound with dressing changes. He has no complaints and  denies acute signs of infection today. He is scheduled for an MRI on 6/2 6/6; patient presents for 1 week follow-up. He has been using collagen to the wound. He has noticed some increased redness to the wound site. He obtained his MRI. 6/13; this is a patient with a wound on the medial aspect of his right first metatarsal head. He had an MRI on 6/2  that suggested a wound along the medial aspect the first metatarsal head extending to bone with underlying osteomyelitis. Bone culture that was done did not show specific pathogens. The biopsies suggested chronic active inflammation and necrosis without malignancy identified presumably osteomyelitis. The patient saw Dr. West Bali on 6/8. He is to started on daptomycin and ceftriaxone he is getting a PICC line tomorrow. He has been on doxycycline and Augmentin but apparently stopped these because of diarrhea. [facility] also [facility] for [facility] opinions. He is using silver collagen on the wound. The patient is already been revascularized in late April with a right posterior tibial angioplasty 6/20; patient presents for 1 week follow-up. He has been on IV antibiotics for osteomyelitis for the past 1 to 2 weeks. He denies any issues with the infusions. He overall feels okay Electronic Signature(s) Signed: 10/28/2020 4:57:39 PM By: [name] DO Entered By: [name] on 10/28/2020 16:53:27 -------------------------------------------------------------------------------- Physical Exam Details Patient Name: Date of Service: Karl Stevenson, Karl MES C. 10/28/2020 2:00 PM Medical Record Number: [id number] Patient Account Number: [id number] Date of Birth/Sex: Treating RN: [birth year] (87 y.o. [name] Primary Care Provider: [name] Other Clinician: Referring Provider: Treating Provider/Extender: [name] in Treatment: 4 Constitutional respirations regular, non-labored and within  target range for patient.. Notes Right foot: Medial aspect of the first metatarsal head with nonviable tissue and probing to the bone. There is some slight erythema and swelling surrounding the wound. Electronic Signature(s) Signed: 10/28/2020 4:57:39 PM By: [name] DO Entered By: [name] on 10/28/2020 16:54:43 -------------------------------------------------------------------------------- Physician Orders Details Patient Name: Date of Service: Karl Stevenson, Karl MES C. 10/28/2020 2:00 PM Medical Record Number: [id number] Patient Account Number: [id number] Date of Birth/Sex: Treating RN: June 10, 1926 (87 y.o. [name], [name] Primary Care Provider: [name] Other Clinician: Referring Provider: Treating Provider/Extender: [name] in Treatment: 4 Verbal / Phone Orders: No Diagnosis Coding ICD-10 Coding Code Description L97.519 Non-pressure chronic ulcer of other part of right foot with unspecified severity I73.9 Peripheral vascular disease, unspecified G90.09 Other idiopathic peripheral autonomic neuropathy R48.54 Chronic systolic (congestive) heart failure I25.119 Atherosclerotic heart disease of native coronary artery with unspecified angina pectoris M86.171 Other acute osteomyelitis, right ankle and foot Follow-up Appointments ppointment in 2 weeks. - Dr. Heber [place] Return A Bathing/ Shower/ Hygiene May shower and wash wound with soap and water. - with dressing changes only. Edema Control - Lymphedema / SCD / Other Elevate legs to the level of the heart or above for 30 minutes daily and/or when sitting, a frequency of: - 3-4 times throughout the day. Avoid standing for long periods of time. Moisturize legs daily. - every night before bed. Off-Loading Open toe surgical shoe to: - both feet wear while walking. Additional Orders / Instructions Other: - follow up with podiatry on 11/06/2020, infectious disease 11/08/2020, vascular  appt [facility] 11/12/2020. Wound Treatment Wound #1 - Foot Wound Laterality: Right, Medial Cleanser: Soap and Water Every Other Day/15 Days Discharge Instructions: May shower and wash wound with dial antibacterial soap and water prior to dressing change. Cleanser: Byram Ancillary Kit - 15 Day Supply (Generic) Every Other Day/15 Days Discharge Instructions: Use supplies as instructed; Kit contains: (15) Saline Bullets; (15) 3x3 Gauze; 15 pr Gloves Prim Dressing: KerraCel Ag Gelling Fiber Dressing, 4x5 in (silver alginate) Every Other Day/15 Days ary Discharge Instructions: Apply silver alginate to wound bed as instructed Secondary Dressing: Woven Gauze Sponges 2x2 in (Generic) Every  Other Day/15 Days Discharge Instructions: Apply over primary dressing as directed. Secondary Dressing: Optifoam Non-Adhesive Dressing, 4x4 in (Generic) Every Other Day/15 Days Discharge Instructions: ***foam donut***Apply over primary dressing as directed. Secured With: Child psychotherapist, Sterile 2x75 (in/in) (Generic) Every Other Day/15 Days Discharge Instructions: Secure with stretch gauze as directed. Secured With: 35M Medipore H Soft Cloth Surgical T 4 x 2 (in/yd) (Generic) Every Other Day/15 Days ape Discharge Instructions: Secure dressing with tape as directed. Electronic Signature(s) Signed: 10/28/2020 4:57:39 PM By: [name] DO Entered By: [name] on 10/28/2020 16:54:55 -------------------------------------------------------------------------------- Problem List Details Patient Name: Date of Service: Karl Stevenson, Karl MES C. 10/28/2020 2:00 PM Medical Record Number: [id number] Patient Account Number: [id number] Date of Birth/Sex: Treating RN: 1927-04-04 (87 y.o. [name] Primary Care Provider: [name] Other Clinician: Referring Provider: Treating Provider/Extender: [name] in Treatment: 4 Active  Problems ICD-10 Encounter Code Description Active Date MDM Diagnosis L97.519 Non-pressure chronic ulcer of other part of right foot with unspecified severity 09/26/2020 No Yes I73.9 Peripheral vascular disease, unspecified 09/26/2020 No Yes G90.09 Other idiopathic peripheral autonomic neuropathy 09/26/2020 No Yes K74.25 Chronic systolic (congestive) heart failure 09/26/2020 No Yes I25.119 Atherosclerotic heart disease of native coronary artery with unspecified angina 09/26/2020 No Yes pectoris M86.171 Other acute osteomyelitis, right ankle and foot 10/14/2020 No Yes Inactive Problems Resolved Problems Electronic Signature(s) Signed: 10/28/2020 4:57:39 PM By: [name] DO Entered By: [name] on 10/28/2020 16:48:36 -------------------------------------------------------------------------------- Progress Note Details Patient Name: Date of Service: Karl Stevenson, Karl MES C. 10/28/2020 2:00 PM Medical Record Number: [id number] Patient Account Number: [id number] Date of Birth/Sex: Treating RN: 21-Apr-1927 (87 y.o. [name] Primary Care Provider: [name] Other Clinician: Referring Provider: Treating Provider/Extender: [name] in Treatment: 4 Subjective Chief Complaint Information obtained from Patient Right foot wound History of Present Illness (HPI) Mr. Keandre Linden is a 87 year old male with a past medical history of peripheral vascular disease, coronary artery disease, chronic systolic heart failure, and peripheral neuropathy that presents to our clinic for a 29-monthhistory of right foot wound. He thinks that the wound started by Rubbing against his shoes. He states he was evaluated by his primary care physician who obtained an x-ray of the right foot. They were sent to vein and vascular for further assessment of the blood flow to the lower extremities. He had an aortogram on 09/05/2020 and had a right posterior tibial artery  angioplasty. He states that the wound has been stable in appearance and size since the procedure. He has been using Bactroban and covering the area with a Band-Aid. He is using soft bedroom shoes. He denies acute signs of infection. 5/26; patient presents for 1 week follow-up. He has been using collagen to the wound with dressing changes. He has no complaints and denies acute signs of infection today. He is scheduled for an MRI on 6/2 6/6; patient presents for 1 week follow-up. He has been using collagen to the wound. He has noticed some increased redness to the wound site. He obtained his MRI. 6/13; this is a patient with a wound on the medial aspect of his right first metatarsal head. He had an MRI on 6/2 that suggested a wound along the medial aspect the first metatarsal head extending to bone with underlying osteomyelitis. Bone culture that was done did not show specific pathogens. The biopsies suggested chronic active inflammation and necrosis without malignancy identified presumably osteomyelitis. The patient saw Dr. MWest Balion  6/8. He is to started on daptomycin and ceftriaxone he is getting a PICC line tomorrow. He has been on doxycycline and Augmentin but apparently stopped these because of diarrhea. [facility] for [facility] opinions. He is using silver collagen on the wound. The patient is already been revascularized in late April with a right posterior tibial angioplasty 6/20; patient presents for 1 week follow-up. He has been on IV antibiotics for osteomyelitis for the past 1 to 2 weeks. He denies any issues with the infusions. He overall feels okay Patient History Information obtained from Patient. Family History Cancer - Mother, Diabetes - Child, Heart Disease - Father, Lung Disease - Siblings, No family history of Hereditary Spherocytosis, Hypertension, Kidney Disease, Seizures, Stroke, Thyroid Problems, Tuberculosis. Social History Never smoker,  Marital Status - Married, Alcohol Use - Rarely, Drug Use - No History, Caffeine Use - Moderate. Medical History Eyes Patient has history of Cataracts Respiratory Patient has history of Chronic Obstructive Pulmonary Disease (COPD) Cardiovascular Patient has history of Arrhythmia, Congestive Heart Failure, Coronary Artery Disease, Hypertension, Myocardial Infarction, Peripheral Venous Disease Gastrointestinal Patient has history of Crohnoos Medical A Surgical History Notes nd Cardiovascular cardiomyopathy, AAA repair Objective Constitutional respirations regular, non-labored and within target range for patient.. Vitals Time Taken: 2:10 PM, Height: 63 in, Weight: 136 lbs, BMI: 24.1, Temperature: 97.5 F, Pulse: 76 bpm, Respiratory Rate: 18 breaths/min, Blood Pressure: 136/72 mmHg. General Notes: Right foot: Medial aspect of the first metatarsal head with nonviable tissue and probing to the bone. There is some slight erythema and swelling surrounding the wound. Integumentary (Hair, Skin) Wound #1 status is Open. Original cause of wound was Gradually Appeared. The date acquired was: 07/23/2020. The wound has been in treatment 4 weeks. The wound is located on the Right,Medial Foot. The wound measures 1.3cm length x 1.5cm width x 0.5cm depth; 1.532cm^2 area and 0.766cm^3 volume. There is Fat Layer (Subcutaneous Tissue) exposed. There is no tunneling or undermining noted. There is a medium amount of serosanguineous drainage noted. The wound margin is well defined and not attached to the wound base. There is medium (34-66%) pink, pale granulation within the wound bed. There is a medium (34- 66%) amount of necrotic tissue within the wound bed including Adherent Slough. General Notes: Maceration, Calloused periwound Assessment Active Problems ICD-10 Non-pressure chronic ulcer of other part of right foot with unspecified severity Peripheral vascular disease, unspecified Other idiopathic  peripheral autonomic neuropathy Chronic systolic (congestive) heart failure Atherosclerotic heart disease of native coronary artery with unspecified angina pectoris Other acute osteomyelitis, right ankle and foot Debridement was attempted of the nonviable tissue present. Patient has started IV antibiotics for osteomyelitis. He also discussed the case with podiatry and at this time does not want to proceed with amputation. We will continue silver alginate Procedures Wound #1 Pre-procedure diagnosis of Wound #1 is a Neuropathic Ulcer-Non Diabetic located on the Right,Medial Foot . There was a Excisional Skin/Subcutaneous Tissue Debridement with a total area of 1.95 sq cm performed by [name], DO. With the following instrument(s): Curette to remove Viable and Non-Viable tissue/material. Material removed includes Subcutaneous Tissue, Slough, Skin: Dermis, Skin: Epidermis, and Fibrin/Exudate after achieving pain control using Lidocaine 4% T opical Solution. A time out was conducted at 15:08, prior to the start of the procedure. A Minimum amount of bleeding was controlled with Pressure. The procedure was tolerated well with a pain level of 0 throughout and a pain level of 0 following the procedure. Post Debridement  Measurements: 1.3cm length x 1.5cm width x 0.5cm depth; 0.766cm^3 volume. Character of Wound/Ulcer Post Debridement requires further debridement. Post procedure Diagnosis Wound #1: Same as Pre-Procedure Plan Follow-up Appointments: Return Appointment in 2 weeks. - Dr. Heber [place] Bathing/ Shower/ Hygiene: May shower and wash wound with soap and water. - with dressing changes only. Edema Control - Lymphedema / SCD / Other: Elevate legs to the level of the heart or above for 30 minutes daily and/or when sitting, a frequency of: - 3-4 times throughout the day. Avoid standing for long periods of time. Moisturize legs daily. - every night before bed. Off-Loading: Open toe surgical shoe  to: - both feet wear while walking. Additional Orders / Instructions: Other: - follow up with podiatry on 11/06/2020, infectious disease 11/08/2020, vascular appt [facility] 11/12/2020. WOUND #1: - Foot Wound Laterality: Right, Medial Cleanser: Soap and Water Every Other Day/15 Days Discharge Instructions: May shower and wash wound with dial antibacterial soap and water prior to dressing change. Cleanser: Byram Ancillary Kit - 15 Day Supply (Generic) Every Other Day/15 Days Discharge Instructions: Use supplies as instructed; Kit contains: (15) Saline Bullets; (15) 3x3 Gauze; 15 pr Gloves Prim Dressing: KerraCel Ag Gelling Fiber Dressing, 4x5 in (silver alginate) Every Other Day/15 Days ary Discharge Instructions: Apply silver alginate to wound bed as instructed Secondary Dressing: Woven Gauze Sponges 2x2 in (Generic) Every Other Day/15 Days Discharge Instructions: Apply over primary dressing as directed. Secondary Dressing: Optifoam Non-Adhesive Dressing, 4x4 in (Generic) Every Other Day/15 Days Discharge Instructions: ***foam donut***Apply over primary dressing as directed. Secured With: Child psychotherapist, Sterile 2x75 (in/in) (Generic) Every Other Day/15 Days Discharge Instructions: Secure with stretch gauze as directed. Secured With: 61M Medipore H Soft Cloth Surgical T 4 x 2 (in/yd) (Generic) Every Other Day/15 Days ape Discharge Instructions: Secure dressing with tape as directed. 1. In office sharp debridement 2. Silver alginate 3. Follow-up in 2 weeks Electronic Signature(s) Signed: 10/28/2020 4:57:39 PM By: [name] DO Entered By: [name] on 10/28/2020 16:57:01 -------------------------------------------------------------------------------- HxROS Details Patient Name: Date of Service: Karl Stevenson, Karl MES C. 10/28/2020 2:00 PM Medical Record Number: [id number] Patient Account Number: [id number] Date of Birth/Sex: Treating RN: July 05, 1926 (87  y.o. [name] Primary Care Provider: [name] Other Clinician: Referring Provider: Treating Provider/Extender: [name] in Treatment: 4 Information Obtained From Patient Eyes Medical History: Positive for: Cataracts Respiratory Medical History: Positive for: Chronic Obstructive Pulmonary Disease (COPD) Cardiovascular Medical History: Positive for: Arrhythmia; Congestive Heart Failure; Coronary Artery Disease; Hypertension; Myocardial Infarction; Peripheral Venous Disease Past Medical History Notes: cardiomyopathy, AAA repair Gastrointestinal Medical History: Positive for: Crohns HBO Extended History Items Eyes: Cataracts Immunizations Pneumococcal Vaccine: Received Pneumococcal Vaccination: Yes Implantable Devices None Family and Social History Cancer: Yes - Mother; Diabetes: Yes - Child; Heart Disease: Yes - Father; Hereditary Spherocytosis: No; Hypertension: No; Kidney Disease: No; Lung Disease: Yes - Siblings; Seizures: No; Stroke: No; Thyroid Problems: No; Tuberculosis: No; Never smoker; Marital Status - Married; Alcohol Use: Rarely; Drug Use: No History; Caffeine Use: Moderate; Financial Concerns: No; Food, Clothing or Shelter Needs: No; Support System Lacking: No; Transportation Concerns: No Electronic Signature(s) Signed: 10/28/2020 4:57:39 PM By: [name] DO Signed: 10/28/2020 5:37:12 PM By: [name] RN, BSN Entered By: [name] on 10/28/2020 16:53:53 -------------------------------------------------------------------------------- [place] Details Patient Name: Date of Service: Karl Stevenson, Karl MES C. 10/28/2020 Medical Record Number: [id number] Patient Account Number: [id number] Date of Birth/Sex: Treating RN: 09-19-1926 (87 y.o. [name]  Primary Care Provider: [name] Other Clinician: Referring Provider: Treating Provider/Extender: [name] in  Treatment: 4 Diagnosis Coding ICD-10 Codes Code Description [phone] Non-pressure chronic ulcer of other part of right foot with unspecified severity I73.9 Peripheral vascular disease, unspecified G90.09 Other idiopathic peripheral autonomic neuropathy H18.34 Chronic systolic (congestive) heart failure I25.119 Atherosclerotic heart disease of native coronary artery with unspecified angina pectoris M86.171 Other acute osteomyelitis, right ankle and foot Facility Procedures CPT4 Code: [id number] Description: 84784 - DEB SUBQ TISSUE 20 SQ CM/< ICD-10 Diagnosis Description L97.519 Non-pressure chronic ulcer of other part of right foot with unspecified severi I73.9 Peripheral vascular disease, unspecified M86.171 Other acute osteomyelitis, right  ankle and foot Modifier: ty Quantity: 1 Physician Procedures : CPT4 Code Description Modifier [id number] 11042 - WC PHYS SUBQ TISS 20 SQ CM ICD-10 Diagnosis Description L97.519 Non-pressure chronic ulcer of other part of right foot with unspecified severity I73.9 Peripheral vascular disease, unspecified M86.171 Other  acute osteomyelitis, right ankle and foot Quantity: 1 Electronic Signature(s) Signed: 10/28/2020 4:57:39 PM By: [name] DO Entered By: [name] on 10/28/2020 16:57:15

## 2020-10-28 NOTE — Progress Notes (Signed)
VENKAT, ANKNEY ([id number]) Visit Report for 10/28/2020 Arrival Information Details Patient Name: Date of Service: [name] 10/28/2020 2:00 PM Medical Record Number: [id number] Patient Account Number: [id number] Date of Birth/Sex: Treating RN: 02/26/1927 (87 y.o. [name] Primary Care Elishua Radford: [name] Other Clinician: Referring Koral Thaden: Treating Jayli Fogleman/Extender: [name] in Treatment: 4 Visit Information History Since Last Visit Added or deleted any medications: No Patient Arrived: [name] Any new allergies or adverse reactions: No Arrival Time: 14:09 Had a fall or experienced change in No Accompanied By: wife activities of daily living that may affect Transfer Assistance: None risk of falls: Patient Identification Verified: Yes Signs or symptoms of abuse/neglect since No Secondary Verification Process Completed: Yes last visito Patient Requires Transmission-Based Precautions: No Hospitalized since last visit: No Patient Has Alerts: Yes Implantable device outside of the clinic No Patient Alerts: Patient on Blood Thinner excluding ABI R=1.17 L=0.87 cellular tissue based products placed in the TBI R=0.51 L=0.55 center since last visit: Has Dressing in Place as Prescribed: Yes Has Footwear/Offloading in Place as Yes Prescribed: Right: Surgical Shoe with Pressure Relief Insole Pain Present Now: No Electronic Signature(s) Signed: 10/28/2020 6:31:05 PM By: Lorrin Jackson Entered By: Lorrin Jackson on 10/28/2020 14:10:18 -------------------------------------------------------------------------------- Encounter Discharge Information Details Patient Name: Date of Service: [name], [name] 10/28/2020 2:00 PM Medical Record Number: [id number] Patient Account Number: [id number] Date of Birth/Sex: Treating RN: 10-02-1926 (87 y.o. [name] Primary Care Damone Fancher: [name] Other Clinician: Referring  Blas Riches: Treating Yonis Carreon/Extender: [name] in Treatment: 4 Encounter Discharge Information Items Post Procedure Vitals Discharge Condition: Stable Temperature (F): 97.5 Ambulatory Status: Walker Pulse (bpm): 76 Discharge Destination: Home Respiratory Rate (breaths/min): 18 Transportation: Private Auto Blood Pressure (mmHg): 136/72 Accompanied By: wife Schedule Follow-up Appointment: Yes Clinical Summary of Care: Electronic Signature(s) Signed: 10/28/2020 6:33:53 PM By: Deon Pilling Entered By: Deon Pilling on 10/28/2020 15:30:29 -------------------------------------------------------------------------------- Lower Extremity Assessment Details Patient Name: Date of Service: [name] 10/28/2020 2:00 PM Medical Record Number: [id number] Patient Account Number: [id number] Date of Birth/Sex: Treating RN: Jul 17, 1926 (87 y.o. [name] Primary Care Elester Apodaca: [name] Other Clinician: Referring Reason Helzer: Treating Neira Bentsen/Extender: [name] in Treatment: 4 Edema Assessment Assessed: [name]: No] [name]: Yes] Edema: [Left: Ye] [Right: s] Calf Left: Right: Point of Measurement: 29 cm From Medial Instep 32 cm Ankle Left: Right: Point of Measurement: 8 cm From Medial Instep 22 cm Vascular Assessment Pulses: Dorsalis Pedis Palpable: [Right:Yes] Electronic Signature(s) Signed: 10/28/2020 6:31:05 PM By: Lorrin Jackson Entered By: Lorrin Jackson on 10/28/2020 14:15:48 -------------------------------------------------------------------------------- Multi Wound Chart Details Patient Name: Date of Service: [name], [name] 10/28/2020 2:00 PM Medical Record Number: [id number] Patient Account Number: [id number] Date of Birth/Sex: Treating RN: Feb 11, 1927 (87 y.o. [name] Primary Care Bernell Haynie: [name] Other Clinician: Referring Oluwatobiloba Martin: Treating Farmer Mccahill/Extender:  [name] in Treatment: 4 Vital Signs Height(in): 50 Pulse(bpm): 74 Weight(lbs): 136 Blood Pressure(mmHg): 136/72 Body Mass Index(BMI): 24 Temperature(F): 97.5 Respiratory Rate(breaths/min): 18 Photos: [1:No Photos Right, Medial Foot] [N/A:N/A N/A] Wound Location: [1:Gradually Appeared] [N/A:N/A] Wounding Event: [1:Neuropathic Ulcer-Non Diabetic] [N/A:N/A] Primary Etiology: [1:Cataracts, Chronic Obstructive] [N/A:N/A] Comorbid History: [1:Pulmonary Disease (COPD), Arrhythmia, Congestive Heart Failure, Coronary Artery Disease, Hypertension, Myocardial Infarction, Peripheral Venous Disease, Crohns 07/23/2020] [N/A:N/A] Date Acquired: [1:4] [N/A:N/A] Weeks of Treatment: [1:Open] [N/A:N/A] Wound Status: [1:1.3x1.5x0.5] [N/A:N/A] Measurements L x W x D (cm) [1:1.532] [N/A:N/A] A (cm) : rea [1:0.766] [N/A:N/A]  Volume (cm) : [1:-30.10%] [N/A:N/A] % Reduction in A [1:rea: -62.60%] [N/A:N/A] % Reduction in Volume: [1:Full Thickness Without Exposed] [N/A:N/A] Classification: [1:Support Structures Medium] [N/A:N/A] Exudate A mount: [1:Serosanguineous] [N/A:N/A] Exudate Type: [1:red, brown] [N/A:N/A] Exudate Color: [1:Well defined, not attached] [N/A:N/A] Wound Margin: [1:Medium (34-66%)] [N/A:N/A] Granulation A mount: [1:Pink, Pale] [N/A:N/A] Granulation Quality: [1:Medium (34-66%)] [N/A:N/A] Necrotic A mount: [1:Fat Layer (Subcutaneous Tissue): Yes N/A] Exposed Structures: [1:Fascia: No Tendon: No Muscle: No Joint: No Bone: No Small (1-33%)] [N/A:N/A] Epithelialization: [1:Debridement - Excisional] [N/A:N/A] Debridement: Pre-procedure Verification/Time Out 15:08 [N/A:N/A] Taken: [1:Lidocaine 4% Topical Solution] [N/A:N/A] Pain Control: [1:Subcutaneous, Slough] [N/A:N/A] Tissue Debrided: [1:Skin/Subcutaneous Tissue] [N/A:N/A] Level: [1:1.95] [N/A:N/A] Debridement A (sq cm): [1:rea Curette] [N/A:N/A] Instrument: [1:Minimum] [N/A:N/A] Bleeding:  [1:Pressure] [N/A:N/A] Hemostasis A chieved: [1:0] [N/A:N/A] Procedural Pain: [1:0] [N/A:N/A] Post Procedural Pain: [1:Procedure was tolerated well] [N/A:N/A] Debridement Treatment Response: [1:1.3x1.5x0.5] [N/A:N/A] Post Debridement Measurements L x W x D (cm) [1:0.766] [N/A:N/A] Post Debridement Volume: (cm) [1:Maceration, Calloused periwound] [N/A:N/A] Assessment Notes: [1:Debridement] [N/A:N/A] Treatment Notes Wound #1 (Foot) Wound Laterality: Right, Medial Cleanser Soap and Water Discharge Instruction: May shower and wash wound with dial antibacterial soap and water prior to dressing change. Byram Ancillary Kit - 15 Day Supply Discharge Instruction: Use supplies as instructed; Kit contains: (15) Saline Bullets; (15) 3x3 Gauze; 15 pr Gloves Peri-Wound Care Topical Primary Dressing KerraCel Ag Gelling Fiber Dressing, 4x5 in (silver alginate) Discharge Instruction: Apply silver alginate to wound bed as instructed Secondary Dressing Woven Gauze Sponges 2x2 in Discharge Instruction: Apply over primary dressing as directed. Optifoam Non-Adhesive Dressing, 4x4 in Discharge Instruction: ***foam donut***Apply over primary dressing as directed. Secured With Conforming Stretch Gauze Bandage, Sterile 2x75 (in/in) Discharge Instruction: Secure with stretch gauze as directed. 78M Medipore H Soft Cloth Surgical T 4 x 2 (in/yd) ape Discharge Instruction: Secure dressing with tape as directed. Compression Wrap Compression Stockings Add-Ons Electronic Signature(s) Signed: 10/28/2020 4:57:39 PM By: [name] DO Signed: 10/28/2020 5:37:12 PM By: [name] RN, BSN Entered By: [name] on 10/28/2020 16:48:42 -------------------------------------------------------------------------------- Multi-Disciplinary Care Plan Details Patient Name: Date of Service: [name], [name] 10/28/2020 2:00 PM Medical Record Number: [id number] Patient Account Number: [id number] Date of  Birth/Sex: Treating RN: [birth year] (87 y.o. [name] Primary Care Thurman Sarver: [name] Other Clinician: Referring Carron Jaggi: Treating Tyresse Jayson/Extender: [name] in Treatment: 4 Active Inactive Wound/Skin Impairment Nursing Diagnoses: Knowledge deficit related to ulceration/compromised skin integrity Goals: Patient/caregiver will verbalize understanding of skin care regimen Date Initiated: 09/26/2020 Target Resolution Date: 11/08/2020 Goal Status: Active Interventions: Assess patient/caregiver ability to obtain necessary supplies Assess patient/caregiver ability to perform ulcer/skin care regimen upon admission and as needed Provide education on ulcer and skin care Treatment Activities: Skin care regimen initiated : 09/26/2020 Topical wound management initiated : 09/26/2020 Notes: Electronic Signature(s) Signed: 10/28/2020 6:33:53 PM By: Deon Pilling Entered By: Deon Pilling on 10/28/2020 15:10:39 -------------------------------------------------------------------------------- Pain Assessment Details Patient Name: Date of Service: [name], [name] 10/28/2020 2:00 PM Medical Record Number: [id number] Patient Account Number: [id number] Date of Birth/Sex: Treating RN: July 28, 1926 (87 y.o. [name] Primary Care Graylin Sperling: [name] Other Clinician: Referring Antoine Fiallos: Treating Shaquasia Caponigro/Extender: [name] in Treatment: 4 Active Problems Location of Pain Severity and Description of Pain Patient Has Paino No Site Locations Pain Management and Medication Current Pain Management: Electronic Signature(s) Signed: 10/28/2020 6:31:05 PM By: Lorrin Jackson Entered By: Lorrin Jackson on 10/28/2020 14:15:58 -------------------------------------------------------------------------------- Patient/Caregiver Education Details Patient Name: Date of Service: [name],  [name] 6/20/2022andnbsp2:00  PM Medical Record Number: [id number] Patient Account Number: [id number] Date of Birth/Gender: Treating RN: 01/15/1927 (87 y.o. [name] Primary Care Physician: [name] Other Clinician: Referring Physician: Treating Physician/Extender: [name] in Treatment: 4 Education Assessment Education Provided To: Patient Education Topics Provided Wound/Skin Impairment: Handouts: Skin Care Do's and Dont's Methods: Explain/Verbal Responses: Reinforcements needed Electronic Signature(s) Signed: 10/28/2020 6:33:53 PM By: Deon Pilling Entered By: Deon Pilling on 10/28/2020 15:11:06 -------------------------------------------------------------------------------- Wound Assessment Details Patient Name: Date of Service: [name], [name] 10/28/2020 2:00 PM Medical Record Number: [id number] Patient Account Number: [id number] Date of Birth/Sex: Treating RN: [birth year] (87 y.o. [name] Primary Care Vidal Lampkins: [name] Other Clinician: Referring Aphrodite Harpenau: Treating Ladeana Laplant/Extender: [name] in Treatment: 4 Wound Status Wound Number: 1 Primary Neuropathic Ulcer-Non Diabetic Etiology: Wound Location: Right, Medial Foot Wound Open Wounding Event: Gradually Appeared Status: Date Acquired: 07/23/2020 Comorbid Cataracts, Chronic Obstructive Pulmonary Disease (COPD), Weeks Of Treatment: 4 History: Arrhythmia, Congestive Heart Failure, Coronary Artery Disease, Clustered Wound: No Hypertension, Myocardial Infarction, Peripheral Venous Disease, Crohns Wound Measurements Length: (cm) 1.3 Width: (cm) 1.5 Depth: (cm) 0.5 Area: (cm) 1.532 Volume: (cm) 0.766 % Reduction in Area: -30.1% % Reduction in Volume: -62.6% Epithelialization: Small (1-33%) Tunneling: No Undermining: No Wound Description Classification: Full Thickness Without Exposed Support Structures Wound Margin: Well defined, not  attached Exudate Amount: Medium Exudate Type: Serosanguineous Exudate Color: red, brown Foul Odor After Cleansing: No Slough/Fibrino Yes Wound Bed Granulation Amount: Medium (34-66%) Exposed Structure Granulation Quality: Pink, Pale Fascia Exposed: No Necrotic Amount: Medium (34-66%) Fat Layer (Subcutaneous Tissue) Exposed: Yes Necrotic Quality: Adherent Slough Tendon Exposed: No Muscle Exposed: No Joint Exposed: No Bone Exposed: No Assessment Notes Maceration, Calloused periwound Treatment Notes Wound #1 (Foot) Wound Laterality: Right, Medial Cleanser Soap and Water Discharge Instruction: May shower and wash wound with dial antibacterial soap and water prior to dressing change. Byram Ancillary Kit - 15 Day Supply Discharge Instruction: Use supplies as instructed; Kit contains: (15) Saline Bullets; (15) 3x3 Gauze; 15 pr Gloves Peri-Wound Care Topical Primary Dressing KerraCel Ag Gelling Fiber Dressing, 4x5 in (silver alginate) Discharge Instruction: Apply silver alginate to wound bed as instructed Secondary Dressing Woven Gauze Sponges 2x2 in Discharge Instruction: Apply over primary dressing as directed. Optifoam Non-Adhesive Dressing, 4x4 in Discharge Instruction: ***foam donut***Apply over primary dressing as directed. Secured With Conforming Stretch Gauze Bandage, Sterile 2x75 (in/in) Discharge Instruction: Secure with stretch gauze as directed. 72M Medipore H Soft Cloth Surgical T 4 x 2 (in/yd) ape Discharge Instruction: Secure dressing with tape as directed. Compression Wrap Compression Stockings Add-Ons Electronic Signature(s) Signed: 10/28/2020 6:31:05 PM By: Lorrin Jackson Entered By: Lorrin Jackson on 10/28/2020 14:18:09 -------------------------------------------------------------------------------- Vitals Details Patient Name: Date of Service: [name], [name] 10/28/2020 2:00 PM Medical Record Number: [id number] Patient Account Number: [id number] Date  of Birth/Sex: Treating RN: 10-30-1926 (87 y.o. [name] Primary Care Jalen Daluz: [name] Other Clinician: Referring Jadelin Eng: Treating Ardell Makarewicz/Extender: [name] in Treatment: 4 Vital Signs Time Taken: 14:10 Temperature (F): 97.5 Height (in): 63 Pulse (bpm): 76 Weight (lbs): 136 Respiratory Rate (breaths/min): 18 Body Mass Index (BMI): 24.1 Blood Pressure (mmHg): 136/72 Reference Range: 80 - 120 mg / dl Electronic Signature(s) Signed: 10/28/2020 6:31:05 PM By: Lorrin Jackson Entered By: Lorrin Jackson on 10/28/2020 14:13:08

## 2020-10-29 ENCOUNTER — Ambulatory Visit: Payer: Medicare PPO | Admitting: Vascular Surgery

## 2020-10-29 DIAGNOSIS — I70221 Atherosclerosis of native arteries of extremities with rest pain, right leg: Secondary | ICD-10-CM | POA: Diagnosis not present

## 2020-10-29 DIAGNOSIS — N189 Chronic kidney disease, unspecified: Secondary | ICD-10-CM | POA: Diagnosis not present

## 2020-10-29 DIAGNOSIS — I2510 Atherosclerotic heart disease of native coronary artery without angina pectoris: Secondary | ICD-10-CM | POA: Diagnosis not present

## 2020-10-29 DIAGNOSIS — I252 Old myocardial infarction: Secondary | ICD-10-CM | POA: Diagnosis not present

## 2020-10-29 DIAGNOSIS — I130 Hypertensive heart and chronic kidney disease with heart failure and stage 1 through stage 4 chronic kidney disease, or unspecified chronic kidney disease: Secondary | ICD-10-CM | POA: Diagnosis not present

## 2020-10-29 DIAGNOSIS — M869 Osteomyelitis, unspecified: Secondary | ICD-10-CM | POA: Diagnosis not present

## 2020-10-29 DIAGNOSIS — L97528 Non-pressure chronic ulcer of other part of left foot with other specified severity: Secondary | ICD-10-CM | POA: Diagnosis not present

## 2020-10-29 DIAGNOSIS — I255 Ischemic cardiomyopathy: Secondary | ICD-10-CM | POA: Diagnosis not present

## 2020-10-29 DIAGNOSIS — I5022 Chronic systolic (congestive) heart failure: Secondary | ICD-10-CM | POA: Diagnosis not present

## 2020-10-29 DIAGNOSIS — M86171 Other acute osteomyelitis, right ankle and foot: Secondary | ICD-10-CM | POA: Diagnosis not present

## 2020-10-30 ENCOUNTER — Ambulatory Visit: Payer: Medicare PPO | Admitting: Podiatry

## 2020-10-31 ENCOUNTER — Other Ambulatory Visit: Payer: Self-pay | Admitting: Cardiovascular Disease

## 2020-10-31 DIAGNOSIS — M86171 Other acute osteomyelitis, right ankle and foot: Secondary | ICD-10-CM | POA: Diagnosis not present

## 2020-11-04 ENCOUNTER — Telehealth: Payer: Self-pay | Admitting: Cardiovascular Disease

## 2020-11-04 NOTE — Telephone Encounter (Signed)
Pt scheduled with Dr. Excell Seltzer on 7/25. This was 1st available with Dr. Excell Seltzer and APP's.

## 2020-11-04 NOTE — Telephone Encounter (Signed)
Pt calling today to report increased fatigue and some SOB. He denies cough, CP, heart palpitations. His HR does increase with activity to the low 100's but reduces when he rests. 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 He is receiving IV daptomycin and cefuroxime currently.   He is not sure this is cardiac related, but wanted Dr. Earmon Phoenix opinion. He is not sure this is heart related. He was offered the nearest APP appointment which is in Sept. He wanted to wait for Dr. Earmon Phoenix recommendation before making an appointment. I advised her I would forward his concern to Dr. Excell Seltzer. In the meantime, I recommended he call his PCP to be seen to better assess his needs.

## 2020-11-04 NOTE — Telephone Encounter (Signed)
I think it makes sense for him to come in for next available cardiac evaluation - please schedule with me or APP whomever has first availability. Thank you

## 2020-11-04 NOTE — Telephone Encounter (Signed)
Patient called in wanted to speak with the nurse. Please advise

## 2020-11-05 ENCOUNTER — Telehealth: Payer: Self-pay

## 2020-11-05 DIAGNOSIS — L97528 Non-pressure chronic ulcer of other part of left foot with other specified severity: Secondary | ICD-10-CM | POA: Diagnosis not present

## 2020-11-05 DIAGNOSIS — Z1152 Encounter for screening for COVID-19: Secondary | ICD-10-CM | POA: Diagnosis not present

## 2020-11-05 DIAGNOSIS — I70221 Atherosclerosis of native arteries of extremities with rest pain, right leg: Secondary | ICD-10-CM | POA: Diagnosis not present

## 2020-11-05 DIAGNOSIS — M86171 Other acute osteomyelitis, right ankle and foot: Secondary | ICD-10-CM | POA: Diagnosis not present

## 2020-11-05 DIAGNOSIS — I252 Old myocardial infarction: Secondary | ICD-10-CM | POA: Diagnosis not present

## 2020-11-05 DIAGNOSIS — M86671 Other chronic osteomyelitis, right ankle and foot: Secondary | ICD-10-CM | POA: Diagnosis not present

## 2020-11-05 DIAGNOSIS — M869 Osteomyelitis, unspecified: Secondary | ICD-10-CM | POA: Diagnosis not present

## 2020-11-05 DIAGNOSIS — I2510 Atherosclerotic heart disease of native coronary artery without angina pectoris: Secondary | ICD-10-CM | POA: Diagnosis not present

## 2020-11-05 DIAGNOSIS — I130 Hypertensive heart and chronic kidney disease with heart failure and stage 1 through stage 4 chronic kidney disease, or unspecified chronic kidney disease: Secondary | ICD-10-CM | POA: Diagnosis not present

## 2020-11-05 DIAGNOSIS — N189 Chronic kidney disease, unspecified: Secondary | ICD-10-CM | POA: Diagnosis not present

## 2020-11-05 DIAGNOSIS — R0602 Shortness of breath: Secondary | ICD-10-CM | POA: Diagnosis not present

## 2020-11-05 DIAGNOSIS — J449 Chronic obstructive pulmonary disease, unspecified: Secondary | ICD-10-CM | POA: Diagnosis not present

## 2020-11-05 DIAGNOSIS — I5022 Chronic systolic (congestive) heart failure: Secondary | ICD-10-CM | POA: Diagnosis not present

## 2020-11-05 DIAGNOSIS — J90 Pleural effusion, not elsewhere classified: Secondary | ICD-10-CM | POA: Diagnosis not present

## 2020-11-05 DIAGNOSIS — I255 Ischemic cardiomyopathy: Secondary | ICD-10-CM | POA: Diagnosis not present

## 2020-11-05 NOTE — Telephone Encounter (Signed)
PA forms and documentation printed, signed and dated.  Placed with medical records for faxing.

## 2020-11-05 NOTE — Telephone Encounter (Signed)
**Note De-Identified  Obfuscation** I attempted a [employer] PA through covermymeds but could not submit as I received a message that a case for this patient and medication was already started and must be closed out before another can be started.  I called covermymeds and as advised per Amber I then printed a Mexiletine PA form off line.  I completed the PA form and attached hospital notes from 2016 when the pt was started on Mexiletine. I have emailed all to Dr Odessa Fleming nurse so she can obtain his signature, date it, and to fax all to [facility] at the fax number written on the cover letter included or to place in the 'To be faxed box" in Medical Records to be faxed.

## 2020-11-06 ENCOUNTER — Other Ambulatory Visit: Payer: Self-pay

## 2020-11-06 ENCOUNTER — Ambulatory Visit (INDEPENDENT_AMBULATORY_CARE_PROVIDER_SITE_OTHER): Payer: Medicare PPO | Admitting: Podiatry

## 2020-11-06 DIAGNOSIS — L97516 Non-pressure chronic ulcer of other part of right foot with bone involvement without evidence of necrosis: Secondary | ICD-10-CM

## 2020-11-06 DIAGNOSIS — M86471 Chronic osteomyelitis with draining sinus, right ankle and foot: Secondary | ICD-10-CM | POA: Diagnosis not present

## 2020-11-07 DIAGNOSIS — M86171 Other acute osteomyelitis, right ankle and foot: Secondary | ICD-10-CM | POA: Diagnosis not present

## 2020-11-08 ENCOUNTER — Telehealth (INDEPENDENT_AMBULATORY_CARE_PROVIDER_SITE_OTHER): Payer: Medicare PPO | Admitting: Internal Medicine

## 2020-11-08 ENCOUNTER — Encounter: Payer: Self-pay | Admitting: Podiatry

## 2020-11-08 ENCOUNTER — Encounter: Payer: Self-pay | Admitting: Internal Medicine

## 2020-11-08 ENCOUNTER — Other Ambulatory Visit: Payer: Self-pay

## 2020-11-08 DIAGNOSIS — M869 Osteomyelitis, unspecified: Secondary | ICD-10-CM | POA: Diagnosis not present

## 2020-11-08 NOTE — Progress Notes (Signed)
[facility]                                                             [place], [place], [place], [address]                                                                  Phn. [phone]; Fax: [phone]                                                                             Date: 10/16/20  Reason for Referral: Osteomyelitis  requesting  Provider: Bevelyn Buckles  Assessment Rt Foot osteomyelitis, 1st metatarsal PVD s/p intervention  CAD s/p CABG Carotid stenosis CKD Medication Monitoring   Problem List Items Addressed This Visit   None   Plan PICC line  Daptomycin 540m IV q48 hrs and ceftriaxone 2g iv q 24 hrs  Plan for 6 weeks  RN to contact [facility] set up OPAT Weekly CBC, CMP, ESR, CRP and CPK Continue Doxycyline, add Augmentin ( renal dosing) until IV abx can be set up  Fu in 3 weeks  Baseline labs today  All questions and concerns were discussed and addressed. Patient verbalized understanding of the plan. ____________________________________________________________________________________________________________________  HPI: 87year old male with past medical history of AAA, COPD, CAD s/p CABG, Ischemic cardiomyopathy with chronic systolic heart failure,  Crohn's disease status post colonic resection, hyperlipidemia, hypertension, PVD, renal artery stenosis who is referred from wound care center for evaluation and management of osteomyelitis of the right foot.  Patient is accompanied by his son.  Patient states he had developed a sore in his right medial foot approximately 3 months ago.  He does not remember how did he developed a sore.  Does not remember any trauma, surgery or insect bite.  He thinks the wound started bivalving against his shoes he has been following up with wound care center and an the wound was thought to be doing well until recently when it  started getting red with yellowish/greenish drainage for which there was concern of cellulitis/osteomyelitis.  He also had an MRI right foot done on 6/2 with findings concerning for osteomyelitis of the right first metatarsal and referred to infectious disease for management of antibiotics  Patient w recently had a aortogram, right lower extremity arteriogram and right posterior tibial artery angioplasty for critical limb ischemia of right lower extremity with tissue loss on 4/28.  He was optimized from a vascular surgery standpoint.  He is on dual antiplatelet therapy per vascular surgery.   ROS: Constitutional: Negative for fever, chills, activity change, appetite change, fatigue and unexpected weight change.  HENT: Negative for congestion, sore throat, rhinorrhea, sneezing, trouble swallowing and sinus pressure.  Eyes: Negative for photophobia and visual disturbance.  Respiratory: Negative for cough, chest tightness, shortness of breath, wheezing and stridor.  Cardiovascular: Negative for chest pain, palpitations and leg swelling.  Gastrointestinal: Negative for nausea, vomiting, abdominal pain, diarrhea, constipation, blood in stool, abdominal distention and anal bleeding.  Genitourinary: Negative for dysuria, hematuria, flank pain and difficulty urinating.  Musculoskeletal: Negative for myalgias, back pain, joint swelling, arthralgias and gait problem.  Skin: Negative for color change, pallor, rash and wound.  Neurological: Negative for dizziness, tremors, weakness and light-headedness.  Hematological: Negative for adenopathy. Does not bruise/bleed easily.  Psychiatric/Behavioral: Negative for behavioral problems, confusion, sleep disturbance, dysphoric mood, decreased concentration and agitation.   Past Medical History:  Diagnosis Date   AAA (abdominal aortic aneurysm) ([facility])    a. Ectatic abdominal aorta with fusiform dilitation 3.4x3.4 and moderate thrombus burden 12/2010   Bradycardia     a. previous intolerance to beta blockers - low dose toprol started 10/2011 for tachycardia but had rash and was discontinued;  b. tolerating low dose propranolol.   Carotid artery occlusion    a. s/p L CEA in 2010;  b. 06/2014 Carotid U/S: stable LICA CEA site w/o significant stenosis bilat.   Chronic systolic heart failure ([facility])    a. 02/2015 Echo:  35-40% (echo 11/2010); c. 02/2015 Echo: EF 20-25%, diff HK, mid-apicalanteroseptal and apical AK, Gr 1 DD, mild MR.   COPD (chronic obstructive pulmonary disease) (HCC)    Coronary artery disease    a. 1969 s/p MI;  b. S/P CABG 1987;  c. S/P redo 2008;  d. 07/2012 Cath: stable->Med Rx, EF 40%; e. 02/2014 MV: anterior and posterolateral/apical infarct with mild peri-infarct ischemia->Med Rx; f. 02/2015 Cath: LM 100d, LAD 100ost, LCX 100ost, OM2 100, VG->OM3->OM4 nl, VG-> LAD nl (Y from VG->OM), VG->OM2 100p.   Crohn's disease ([place])    a. s/p colon resection   Gout    Hyperlipidemia    Hypertension    Ischemic cardiomyopathy    a. EF 34% (Lexiscan 06/2011); b. 35-40% (echo 11/2010); c. 02/2015 Echo: EF 20-25%, diff HK, mid-apicalanteroseptal and apical AK, Gr 1 DD, mild MR.   Peripheral vascular disease ([place])    a. Carotid dz s/p L CEA, RAS, AAA, LE dz   RBBB    Renal artery stenosis ([place])    a. Dopplers 12/2010 - 1-59% R ostial renal artery stenosis, 2 L renal arteries both widely patent   Syncope    Tachycardia    a. 10/2011 - atrial tach vs avnrt - BB started.   Ventricular tachycardia ([place])    a. 02/2015 ->amio 200 daily added-->later switched to mexilitene.   Past Surgical History:  Procedure Laterality Date   ABDOMINAL AORTAGRAM N/A 06/11/2011   Procedure: ABDOMINAL AORTAGRAM;  Surgeon: Conrad Chesterfield, MD;  Location: [facility] CATH LAB;  Service: Cardiovascular;  Laterality: N/A;   ABDOMINAL AORTOGRAM W/LOWER EXTREMITY N/A 09/05/2020   Procedure: ABDOMINAL AORTOGRAM W/LOWER EXTREMITY;  Surgeon: Marty Heck, MD;  Location: [facility] CV LAB;   Service: Cardiovascular;  Laterality: N/A;   CARDIAC CATHETERIZATION  07/28/2012   Native 3v CAD, continued patency of the SVG-OM1-LAD and SVG-OM2-left PDA. LVEF 40% with multiple WMAs   CARDIAC CATHETERIZATION N/A 03/08/2015   Procedure: Left Heart Cath and Coronary Angiography;  Surgeon: Leonie Man, MD;  Location: [facility] CV LAB;  Service: Cardiovascular;  Laterality: N/A;   CAROTID ENDARTERECTOMY Left 2010   CATARACT EXTRACTION, BILATERAL     COLON RESECTION  2008   COLON SURGERY  CORONARY ANGIOPLASTY     CORONARY ARTERY BYPASS GRAFT  10/31/85 & 08/19/06   ENDARTERECTOMY  08/26/2011   Procedure: ENDARTERECTOMY ILIAC;  Surgeon: Conrad Owasso, MD;  Location: [facility];  Service: Vascular;  Laterality: Right;  Right Femoral Artery Endarterectomy with vascu guard patch angioplasty & intraoperative arteriogram.   EYE SURGERY     FEMORAL ENDARTERECTOMY Left  06/2011   ileofemoral endarterectomy with bovine patch angioplasty   IR FLUORO GUIDE CV LINE RIGHT  10/22/2020   IR US GUIDE VASC ACCESS RIGHT  10/22/2020   LEFT HEART CATHETERIZATION WITH CORONARY ANGIOGRAM N/A 07/28/2012   Procedure: LEFT HEART CATHETERIZATION WITH CORONARY ANGIOGRAM;  Surgeon: Sherren Mocha, MD;  Location: [facility] CATH LAB;  Service: Cardiovascular;  Laterality: N/A;   PERIPHERAL VASCULAR BALLOON ANGIOPLASTY Right 09/05/2020   Procedure: PERIPHERAL VASCULAR BALLOON ANGIOPLASTY;  Surgeon: Marty Heck, MD;  Location: [facility] CV LAB;  Service: Cardiovascular;  Laterality: Right;  Posterior Tibial   PR VEIN BYPASS GRAFT,AORTO-FEM-POP  10/1985   TONSILLECTOMY     as a child   Current Outpatient Medications on File Prior to Visit  Medication Sig Dispense Refill   acetaminophen (TYLENOL) 500 MG tablet Take 1,000 mg by mouth every 6 (six) hours as needed (pain).      amoxicillin-clavulanate (AUGMENTIN) 500-125 MG tablet Take 1 tablet (500 mg total) by mouth 2 (two) times daily. 28 tablet 0   aspirin EC 81 MG tablet  Take 81 mg by mouth daily.     atorvastatin (LIPITOR) 10 MG tablet Take 10 mg by mouth daily.     carboxymethylcellulose (REFRESH PLUS) 0.5 % SOLN Place 1 drop into both eyes 4 (four) times daily.     cholecalciferol (VITAMIN D3) 25 MCG (1000 UNIT) tablet Take 1,000 Units by mouth daily.     clopidogrel (PLAVIX) 75 MG tablet TAKE 1 TABLET EVERY DAY 90 tablet 3   fexofenadine (ALLEGRA) 180 MG tablet Take 180 mg by mouth daily.     ipratropium (ATROVENT) 0.03 % nasal spray Place 1 spray into both nostrils daily.     isosorbide mononitrate (IMDUR) 120 MG 24 hr tablet Take 120 mg by mouth daily.     isosorbide mononitrate (IMDUR) 60 MG 24 hr tablet Take 60 mg by mouth daily.     losartan (COZAAR) 25 MG tablet TAKE 1 TABLET TWICE DAILY (Patient taking differently: Take 25 mg by mouth in the morning and at bedtime.) 180 tablet 2   mexiletine (MEXITIL) 200 MG capsule TAKE 1 CAPSULE (200 MG TOTAL) BY MOUTH EVERY 12 HOURS. 180 capsule 1   Multiple Vitamin (MULTIVITAMIN WITH MINERALS) TABS tablet Take 2 tablets by mouth daily.     mupirocin ointment (BACTROBAN) 2 % Apply 1 application topically 2 (two) times daily. Apply to foot     nitroGLYCERIN (NITROSTAT) 0.4 MG SL tablet Place 1 tablet (0.4 mg total) under the tongue every 5 (five) minutes as needed for chest pain. 25 tablet 3   OVER THE COUNTER MEDICATION Take 1 capsule by mouth daily. Nerve Health     pantoprazole (PROTONIX) 40 MG tablet TAKE 1 TABLET EVERY DAY 90 tablet 3   Polyethyl Glycol-Propyl Glycol (SYSTANE) 0.4-0.3 % SOLN Place 1 drop into both eyes in the morning and at bedtime.     polyethylene glycol powder (GLYCOLAX/MIRALAX) powder Take 17 g by mouth at bedtime. Mix in 8 oz liquid and drink     ranolazine (RANEXA) 500 MG 12 hr tablet TAKE 2 TABLETS TWICE  DAILY 360 tablet 3   triamcinolone ointment (KENALOG) 0.1 % Apply 1 application topically 2 (two) times daily. Mix with Mupirocin and apply to head     vitamin B-12 (CYANOCOBALAMIN) 1000  MCG tablet Take 2,000 mcg by mouth daily.     No current facility-administered medications on file prior to visit.   Allergies  Allergen Reactions   Amoxicillin     diaharrea   Novocain [Procaine Hcl] Other (See Comments)    Cold sweats and very bad headache.    Procaine Other (See Comments)    Sweating headache   Metoprolol Hives, Itching and Rash   Social History   Socioeconomic History   Marital status: Married    Spouse name: Not on file   Number of children: Not on file   Years of education: Not on file   Highest education level: Not on file  Occupational History   Not on file  Tobacco Use   Smoking status: Never   Smokeless tobacco: Never  Vaping Use   Vaping Use: Never used  Substance and Sexual Activity   Alcohol use: No   Drug use: No   Sexual activity: Not on file  Other Topics Concern   Not on file  Social History Narrative   Not on file   Social Determinants of Health   Financial Resource Strain: Not on file  Food Insecurity: Not on file  Transportation Needs: Not on file  Physical Activity: Not on file  Stress: Not on file  Social Connections: Not on file  Intimate Partner Violence: Not on file     Vitals There were no vitals taken for this visit.   Examination  General - not in acute distress, comfortably sitting in chair, MILD DIFFICULTY WITH HEARING  HEENT - PEERLA, no pallor and no icterus Chest - b/l clear air entry, no additional sounds CVS- Normal s1s2, RRR Abdomen - Soft, Non tender , non distended Ext- no pedal edema LEFT FOOT HAS NO WOUND Rt FOOT   Neuro: grossly normal Back - WNL Psych : calm and cooperative   Recent labs CBC Latest Ref Rng & Units 09/05/2020 05/15/2020 01/31/2020  WBC 3.4 - 10.8 x10E3/uL - 4.9 4.9  Hemoglobin 13.0 - 17.0 g/dL 10.2(L) 10.7(L) 10.9(L)  Hematocrit 39.0 - 52.0 % 30.0(L) 31.4(L) 32.8(L)  Platelets 150 - 450 x10E3/uL - 160 148(L)   CMP Latest Ref Rng & Units 09/05/2020 05/15/2020 01/31/2020   Glucose 70 - 99 mg/dL 97 115(H) 110(H)  BUN 8 - 23 mg/dL 28(H) 28 30  Creatinine 0.61 - 1.24 mg/dL 1.40(H) 1.36(H) 1.39(H)  Sodium 135 - 145 mmol/L 142 142 141  Potassium 3.5 - 5.1 mmol/L 4.3 4.9 4.8  Chloride 98 - 111 mmol/L 106 106 106  CO2 20 - 29 mmol/L - 22 24  Calcium 8.6 - 10.2 mg/dL - 9.0 9.0  Total Protein 6.0 - 8.5 g/dL - - -  Total Bilirubin 0.0 - 1.2 mg/dL - - -  Alkaline Phos 39 - 117 IU/L - - -  AST 0 - 40 IU/L - - -  ALT 0 - 44 IU/L - - -     Pertinent Microbiology Results for orders placed or performed in visit on 10/14/20  Aerobic/Anaerobic Culture w Gram Stain (surgical/deep wound)     Status: None   Collection Time: 10/14/20  2:40 PM   Specimen: Foot  Result Value Ref Range Status   Specimen Description   Final    FOOT RIGHT Performed at [facility]  [place] [address], [place], [place] [address]    Special Requests   Final    NONE Performed at [facility], [place] [address] [place] [address], [place], [place] [address]    Gram Stain NO WBC SEEN NO ORGANISMS SEEN   Final   Culture   Final    FEW NORMAL SKIN FLORA NO GROUP A STREP (S.PYOGENES) ISOLATED NO STAPHYLOCOCCUS AUREUS ISOLATED NO ANAEROBES ISOLATED Performed at [facility], [place] [address] [place], [place] [address]    Report Status 10/19/2020 FINAL  Final     Pertinent Imaging Vascular US ABI 09/24/20 Summary:  Right: Resting right ankle-brachial index is within normal range. No  evidence of significant right lower extremity arterial disease.  The right toe-brachial index is abnormal.   Left: Resting left ankle-brachial index indicates mild left lower  extremity arterial disease; however, indices may be overestimated due to  calcified vessels.  The left toe-brachial index is abnormal.    MRI rt foot WWO contrast 10/10/20 FINDINGS: Bones/Joint/Cartilage   Abnormal marrow edema and enhancement with corresponding decreased T1 marrow signal involving the first  metatarsal head and distal shaft, consistent with osteomyelitis. No fracture or dislocation. Normal alignment. No joint effusion.   Ligaments   Collateral ligaments are intact.  Lisfranc ligament is intact.   Muscles and Tendons Flexor and extensor tendons are intact. Increased T2 signal within the intrinsic muscles of the forefoot, nonspecific, but likely related to diabetic muscle changes.   Soft tissue Ulceration and small sinus tract along the medial aspect of the first metatarsal head, extending to bone. No fluid collection or hematoma. No soft tissue mass. Severe dorsal soft tissue swelling without enhancement, consistent with third spacing or venous insufficiency.   IMPRESSION: 1. Ulceration and small sinus tract along the medial aspect of the first metatarsal head, extending to bone, with underlying osteomyelitis. No abscess.   All pertinent labs/Imagings/notes reviewed. All pertinent plain films and CT images have been personally visualized and interpreted; radiology reports have been reviewed. Decision making incorporated into the Impression / Recommendations.  I have spent 60 minutes for this patient encounter including  review of prior medical records with greater than 50% of time in face to face counsel of the patient/discussing diagnostics and plan of care.   Electronically signed by:  Rosiland Oz, MD Infectious Disease Physician [facility] [address] [place], [place] [address] Phone: [phone]  Fax: [phone]

## 2020-11-08 NOTE — Progress Notes (Signed)
Subjective:  Patient ID: Karl Stevenson, male    DOB: 10-08-1926,  MRN: [id number]  Chief Complaint  Patient presents with   Wound Check    Right hallux     87 y.o. male presents for wound care.  Patient presents with follow-up of right first metatarsophalangeal joint wound with underlying osteomyelitis.  It seems like the wound may be improving as well.  He is getting managed primarily at the wound care center.  He is getting PICC line through infectious disease.  He denies any other acute complaints.  He has been revascularized with right posterior tibial angioplasty and is the only flow to the right foot.   Review of Systems: Negative except as noted in the HPI. Denies N/V/F/Ch.  Past Medical History:  Diagnosis Date   AAA (abdominal aortic aneurysm) ([facility])    a. Ectatic abdominal aorta with fusiform dilitation 3.4x3.4 and moderate thrombus burden 12/2010   Bradycardia    a. previous intolerance to beta blockers - low dose toprol started 10/2011 for tachycardia but had rash and was discontinued;  b. tolerating low dose propranolol.   Carotid artery occlusion    a. s/p L CEA in 2010;  b. 06/2014 Carotid U/S: stable LICA CEA site w/o significant stenosis bilat.   Chronic systolic heart failure (HCC)    a. 02/2015 Echo:  35-40% (echo 11/2010); c. 02/2015 Echo: EF 20-25%, diff HK, mid-apicalanteroseptal and apical AK, Gr 1 DD, mild MR.   COPD (chronic obstructive pulmonary disease) (HCC)    Coronary artery disease    a. 1969 s/p MI;  b. S/P CABG 1987;  c. S/P redo 2008;  d. 07/2012 Cath: stable->Med Rx, EF 40%; e. 02/2014 MV: anterior and posterolateral/apical infarct with mild peri-infarct ischemia->Med Rx; f. 02/2015 Cath: LM 100d, LAD 100ost, LCX 100ost, OM2 100, VG->OM3->OM4 nl, VG-> LAD nl (Y from VG->OM), VG->OM2 100p.   Crohn's disease (HCC)    a. s/p colon resection   Gout    Hyperlipidemia    Hypertension    Ischemic cardiomyopathy    a. EF 34% (Lexiscan 06/2011); b. 35-40%  (echo 11/2010); c. 02/2015 Echo: EF 20-25%, diff HK, mid-apicalanteroseptal and apical AK, Gr 1 DD, mild MR.   Peripheral vascular disease (HCC)    a. Carotid dz s/p L CEA, RAS, AAA, LE dz   RBBB    Renal artery stenosis (HCC)    a. Dopplers 12/2010 - 1-59% R ostial renal artery stenosis, 2 L renal arteries both widely patent   Syncope    Tachycardia    a. 10/2011 - atrial tach vs avnrt - BB started.   Ventricular tachycardia (HCC)    a. 02/2015 ->amio 200 daily added-->later switched to mexilitene.    Current Outpatient Medications:    acetaminophen (TYLENOL) 500 MG tablet, Take 1,000 mg by mouth every 6 (six) hours as needed (pain). , Disp: , Rfl:    amoxicillin-clavulanate (AUGMENTIN) 500-125 MG tablet, Take 1 tablet (500 mg total) by mouth 2 (two) times daily., Disp: 28 tablet, Rfl: 0   aspirin EC 81 MG tablet, Take 81 mg by mouth daily., Disp: , Rfl:    atorvastatin (LIPITOR) 10 MG tablet, Take 10 mg by mouth daily., Disp: , Rfl:    carboxymethylcellulose (REFRESH PLUS) 0.5 % SOLN, Place 1 drop into both eyes 4 (four) times daily., Disp: , Rfl:    cholecalciferol (VITAMIN D3) 25 MCG (1000 UNIT) tablet, Take 1,000 Units by mouth daily., Disp: , Rfl:    clopidogrel (PLAVIX)  75 MG tablet, TAKE 1 TABLET EVERY DAY, Disp: 90 tablet, Rfl: 3   fexofenadine (ALLEGRA) 180 MG tablet, Take 180 mg by mouth daily., Disp: , Rfl:    ipratropium (ATROVENT) 0.03 % nasal spray, Place 1 spray into both nostrils daily., Disp: , Rfl:    isosorbide mononitrate (IMDUR) 120 MG 24 hr tablet, Take 120 mg by mouth daily., Disp: , Rfl:    isosorbide mononitrate (IMDUR) 60 MG 24 hr tablet, Take 60 mg by mouth daily., Disp: , Rfl:    losartan (COZAAR) 25 MG tablet, TAKE 1 TABLET TWICE DAILY (Patient taking differently: Take 25 mg by mouth in the morning and at bedtime.), Disp: 180 tablet, Rfl: 2   mexiletine (MEXITIL) 200 MG capsule, TAKE 1 CAPSULE (200 MG TOTAL) BY MOUTH EVERY 12 HOURS., Disp: 180 capsule, Rfl: 1    Multiple Vitamin (MULTIVITAMIN WITH MINERALS) TABS tablet, Take 2 tablets by mouth daily., Disp: , Rfl:    mupirocin ointment (BACTROBAN) 2 %, Apply 1 application topically 2 (two) times daily. Apply to foot, Disp: , Rfl:    nitroGLYCERIN (NITROSTAT) 0.4 MG SL tablet, Place 1 tablet (0.4 mg total) under the tongue every 5 (five) minutes as needed for chest pain., Disp: 25 tablet, Rfl: 3   OVER THE COUNTER MEDICATION, Take 1 capsule by mouth daily. Nerve Health, Disp: , Rfl:    pantoprazole (PROTONIX) 40 MG tablet, TAKE 1 TABLET EVERY DAY, Disp: 90 tablet, Rfl: 3   Polyethyl Glycol-Propyl Glycol (SYSTANE) 0.4-0.3 % SOLN, Place 1 drop into both eyes in the morning and at bedtime., Disp: , Rfl:    polyethylene glycol powder (GLYCOLAX/MIRALAX) powder, Take 17 g by mouth at bedtime. Mix in 8 oz liquid and drink, Disp: , Rfl:    ranolazine (RANEXA) 500 MG 12 hr tablet, TAKE 2 TABLETS TWICE DAILY, Disp: 360 tablet, Rfl: 3   triamcinolone ointment (KENALOG) 0.1 %, Apply 1 application topically 2 (two) times daily. Mix with Mupirocin and apply to head, Disp: , Rfl:    vitamin B-12 (CYANOCOBALAMIN) 1000 MCG tablet, Take 2,000 mcg by mouth daily., Disp: , Rfl:   Social History   Tobacco Use  Smoking Status Never  Smokeless Tobacco Never    Allergies  Allergen Reactions   Amoxicillin     diaharrea   Novocain [Procaine Hcl] Other (See Comments)    Cold sweats and very bad headache.    Procaine Other (See Comments)    Sweating headache   Metoprolol Hives, Itching and Rash   Objective:  There were no vitals filed for this visit. There is no height or weight on file to calculate BMI. Constitutional Well developed. Well nourished.  Vascular Dorsalis pedis pulses palpable bilaterally. Posterior tibial pulses palpable bilaterally. Capillary refill normal to all digits.  No cyanosis or clubbing noted. Pedal hair growth normal.  Neurologic Normal speech. Oriented to person, place, and  time. Protective sensation absent  Dermatologic Wound Location: Right first MPJ wound with underlying osteomyelitis.  Probes down to bone.  No cellulitis noted.  No purulent drainage expressed.  Bone is clinically palpated.  No malodor present Wound Base: Mixed Granular/Fibrotic Peri-wound: Calloused Exudate: Scant/small amount Serous exudate Wound Measurements: -See below  Orthopedic: No pain to palpation either foot.   Radiographs: MRI was reviewed  Ulcer IMPRESSION: 1. Ulceration and small sinus tract along the medial aspect of the first metatarsal head, extending to bone, with underlying osteomyelitis. No abscess. Assessment:   1. Chronic osteomyelitis of right foot with draining  sinus (HCC)   2. Ulcer of right foot with bone involvement without evidence of necrosis [facility])     Plan:  Patient was evaluated and treated and all questions answered.  Right first metatarsophalangeal joint wound medial aspect with underlying osteomyelitis stable -At this time I do not have anything to offer the patient in terms of managing.  Continue services at the wound care center.  Unless if it becomes acutely infected patient would not benefit from a amputation as there is more risk of further amputation given the nature of the flow to his leg and his age.  If acutely infected patient may need urgent amputation versus a major amputation.  Patient states understanding -Continue PICC line IV antibiotics per infectious disease. -He would like to discuss all of his surgical options at this time and I will reevaluate him in 3 weeks. -Weightbearing as tolerated with a surgical shoe. -Continue local wound care per wound care center.  No follow-ups on file.

## 2020-11-08 NOTE — Progress Notes (Signed)
   Subjective:    Patient ID: DAYMEN HASSEBROCK, male    DOB: 1926-06-17, 87 y.o.   MRN: [id number]  HPI I connected with  AHSAN ESTERLINE on 11/08/20 by phone and verified that I am speaking with the correct person using two identifiers.   I discussed the limitations of evaluation and management by telemedicine. The patient expressed understanding and agreed to proceed.  Location: Patient - [place] residence Physician - clinic  Duration of visit:  12 minutes  HPI:  This is a follow up visit for osteomyelitis of his right foot.   He has an ulcer that developed and an MRI in June concerning for osteomyelitis.  He was previous seen by vascular surgery for critical limb ischemia and underwent successful angioplasty.  He was started on empiric daptomycin and ceftriaxone which was started on 6/14 after an oral antibiotic bridge until this was started.  He has the picc line and is tolerating the antibiotics.  No associated rash or diarrhea.  Wound not improving much and is followed by Dr. Mikey Bussing.   Review of Systems  Constitutional:  Negative for chills and fever.  Gastrointestinal:  Negative for diarrhea and nausea.  Skin:  Negative for rash.      Objective:   Physical Exam Neurological:     Mental Status: He is alert.  Psychiatric:        Mood and Affect: Mood normal.          Assessment & Plan:

## 2020-11-08 NOTE — Assessment & Plan Note (Signed)
He is doing well with the antibiotics and no side effects reported.  Will plan to continue with the daptomycin and ceftriaxone for 6 weeks through July 25th and he will follow up in person with Dr. Elinor Parkinson just prior to that.  Wife also on call.

## 2020-11-11 ENCOUNTER — Emergency Department (HOSPITAL_COMMUNITY): Payer: Medicare PPO

## 2020-11-11 ENCOUNTER — Other Ambulatory Visit: Payer: Self-pay

## 2020-11-11 ENCOUNTER — Emergency Department (HOSPITAL_COMMUNITY)
Admission: EM | Admit: 2020-11-11 | Discharge: 2020-11-11 | Disposition: A | Discharge status: Home / Self Care | Payer: Medicare PPO | Attending: Emergency Medicine | Admitting: Emergency Medicine

## 2020-11-11 DIAGNOSIS — Z452 Encounter for adjustment and management of vascular access device: Secondary | ICD-10-CM | POA: Diagnosis not present

## 2020-11-11 DIAGNOSIS — J90 Pleural effusion, not elsewhere classified: Secondary | ICD-10-CM | POA: Diagnosis not present

## 2020-11-11 DIAGNOSIS — S41111A Laceration without foreign body of right upper arm, initial encounter: Secondary | ICD-10-CM | POA: Diagnosis not present

## 2020-11-11 DIAGNOSIS — Y900 Blood alcohol level of less than 20 mg/100 ml: Secondary | ICD-10-CM | POA: Insufficient documentation

## 2020-11-11 DIAGNOSIS — S42024A Nondisplaced fracture of shaft of right clavicle, initial encounter for closed fracture: Secondary | ICD-10-CM | POA: Insufficient documentation

## 2020-11-11 DIAGNOSIS — R9431 Abnormal electrocardiogram [ECG] [EKG]: Secondary | ICD-10-CM | POA: Diagnosis not present

## 2020-11-11 DIAGNOSIS — S0990XA Unspecified injury of head, initial encounter: Secondary | ICD-10-CM | POA: Diagnosis not present

## 2020-11-11 DIAGNOSIS — S0003XA Contusion of scalp, initial encounter: Secondary | ICD-10-CM | POA: Diagnosis not present

## 2020-11-11 DIAGNOSIS — T1490XA Injury, unspecified, initial encounter: Secondary | ICD-10-CM

## 2020-11-11 DIAGNOSIS — S81811A Laceration without foreign body, right lower leg, initial encounter: Secondary | ICD-10-CM | POA: Diagnosis not present

## 2020-11-11 DIAGNOSIS — W19XXXA Unspecified fall, initial encounter: Secondary | ICD-10-CM | POA: Diagnosis not present

## 2020-11-11 DIAGNOSIS — M25511 Pain in right shoulder: Secondary | ICD-10-CM | POA: Diagnosis not present

## 2020-11-11 DIAGNOSIS — R58 Hemorrhage, not elsewhere classified: Secondary | ICD-10-CM | POA: Diagnosis not present

## 2020-11-11 DIAGNOSIS — W01198A Fall on same level from slipping, tripping and stumbling with subsequent striking against other object, initial encounter: Secondary | ICD-10-CM | POA: Insufficient documentation

## 2020-11-11 DIAGNOSIS — S0083XA Contusion of other part of head, initial encounter: Secondary | ICD-10-CM | POA: Insufficient documentation

## 2020-11-11 DIAGNOSIS — Y92009 Unspecified place in unspecified non-institutional (private) residence as the place of occurrence of the external cause: Secondary | ICD-10-CM | POA: Insufficient documentation

## 2020-11-11 DIAGNOSIS — I10 Essential (primary) hypertension: Secondary | ICD-10-CM | POA: Diagnosis not present

## 2020-11-11 DIAGNOSIS — J8489 Other specified interstitial pulmonary diseases: Secondary | ICD-10-CM | POA: Diagnosis not present

## 2020-11-11 DIAGNOSIS — S199XXA Unspecified injury of neck, initial encounter: Secondary | ICD-10-CM | POA: Diagnosis not present

## 2020-11-11 DIAGNOSIS — S51011A Laceration without foreign body of right elbow, initial encounter: Secondary | ICD-10-CM | POA: Diagnosis not present

## 2020-11-11 DIAGNOSIS — Z79899 Other long term (current) drug therapy: Secondary | ICD-10-CM | POA: Insufficient documentation

## 2020-11-11 DIAGNOSIS — S4991XA Unspecified injury of right shoulder and upper arm, initial encounter: Secondary | ICD-10-CM | POA: Diagnosis present

## 2020-11-11 DIAGNOSIS — R0902 Hypoxemia: Secondary | ICD-10-CM | POA: Diagnosis not present

## 2020-11-11 DIAGNOSIS — M50221 Other cervical disc displacement at C4-C5 level: Secondary | ICD-10-CM | POA: Diagnosis not present

## 2020-11-11 LAB — CBC
HCT: 29.6 % — ABNORMAL LOW (ref 39.0–52.0)
Hemoglobin: 9.8 g/dL — ABNORMAL LOW (ref 13.0–17.0)
MCH: 36.2 pg — ABNORMAL HIGH (ref 26.0–34.0)
MCHC: 33.1 g/dL (ref 30.0–36.0)
MCV: 109.2 fL — ABNORMAL HIGH (ref 80.0–100.0)
Platelets: 209 10*3/uL (ref 150–400)
RBC: 2.71 MIL/uL — ABNORMAL LOW (ref 4.22–5.81)
RDW: 13.2 % (ref 11.5–15.5)
WBC: 7.2 10*3/uL (ref 4.0–10.5)
nRBC: 0 % (ref 0.0–0.2)

## 2020-11-11 LAB — I-STAT CHEM 8, ED
BUN: 35 mg/dL — ABNORMAL HIGH (ref 8–23)
Calcium, Ion: 1.13 mmol/L — ABNORMAL LOW (ref 1.15–1.40)
Chloride: 102 mmol/L (ref 98–111)
Creatinine, Ser: 1.5 mg/dL — ABNORMAL HIGH (ref 0.61–1.24)
Glucose, Bld: 108 mg/dL — ABNORMAL HIGH (ref 70–99)
HCT: 31 % — ABNORMAL LOW (ref 39.0–52.0)
Hemoglobin: 10.5 g/dL — ABNORMAL LOW (ref 13.0–17.0)
Potassium: 4.7 mmol/L (ref 3.5–5.1)
Sodium: 139 mmol/L (ref 135–145)
TCO2: 28 mmol/L (ref 22–32)

## 2020-11-11 LAB — COMPREHENSIVE METABOLIC PANEL
ALT: 23 U/L (ref 0–44)
AST: 28 U/L (ref 15–41)
Albumin: 3 g/dL — ABNORMAL LOW (ref 3.5–5.0)
Alkaline Phosphatase: 63 U/L (ref 38–126)
Anion gap: 9 (ref 5–15)
BUN: 34 mg/dL — ABNORMAL HIGH (ref 8–23)
CO2: 27 mmol/L (ref 22–32)
Calcium: 8.8 mg/dL — ABNORMAL LOW (ref 8.9–10.3)
Chloride: 102 mmol/L (ref 98–111)
Creatinine, Ser: 1.55 mg/dL — ABNORMAL HIGH (ref 0.61–1.24)
GFR, Estimated: 42 mL/min — ABNORMAL LOW (ref >60)
Glucose, Bld: 111 mg/dL — ABNORMAL HIGH (ref 70–99)
Potassium: 4.6 mmol/L (ref 3.5–5.1)
Sodium: 138 mmol/L (ref 135–145)
Total Bilirubin: 0.8 mg/dL (ref 0.3–1.2)
Total Protein: 6.3 g/dL — ABNORMAL LOW (ref 6.5–8.1)

## 2020-11-11 LAB — SAMPLE TO BLOOD BANK

## 2020-11-11 LAB — PROTIME-INR
INR: 0.9 (ref 0.8–1.2)
Prothrombin Time: 12.6 seconds (ref 11.4–15.2)

## 2020-11-11 LAB — ETHANOL: Alcohol, Ethyl (B): <10 mg/dL (ref <10)

## 2020-11-11 LAB — LACTIC ACID, PLASMA: Lactic Acid, Venous: 1.1 mmol/L (ref 0.5–1.9)

## 2020-11-11 MED ORDER — SODIUM CHLORIDE 0.9 % IV SOLN
2.0000 g | INTRAVENOUS | Status: DC
  Administered 2020-11-11: 2 g via INTRAVENOUS
  Filled 2020-11-11: qty 20

## 2020-11-11 MED ORDER — SODIUM CHLORIDE 0.9 % IV SOLN
500.0000 mg | Freq: Once | INTRAVENOUS | Status: AC
  Administered 2020-11-11: 500 mg via INTRAVENOUS
  Filled 2020-11-11: qty 10

## 2020-11-11 NOTE — ED Notes (Signed)
Patient from home, tripped and hit head and arm on dresser. Patient on plavix, pain to right elbow with large lacertion. PICC to right arm for at home antibiotics for right foot infection. VSS, patient A&O. Previous fall with old bruising on left side.

## 2020-11-11 NOTE — Discharge Instructions (Addendum)
Instructions  You should try to wear the sling at all times for the broken collarbone.  You will need to call to make a follow-up appointment in 2 weeks with the orthopedic doctor at the number above.  You can take the sling off when you are sleeping if it is uncomfortable.  This broken collarbone should heal on its own as long as the injury does not get worse.  It is important to avoid any other falls.  You should use a cane at all times of walking.  We did have our case manager reach out to you about home health and physical therapy.  We will try to arrange this at home this week.  You have a cut on the right arm that I placed 9 stitches in.  The stitches need to be removed in 10 days (July 14th).  You can have this done at an urgent care, your doctor's office, with home health nurse, or return to the ER.  You should keep the wound dressed for the next 2 days.  After that you can take down the bandages and shower or bathe normally.

## 2020-11-11 NOTE — Progress Notes (Signed)
Orthopedic Tech Progress Note Patient Details:  Karl Stevenson Jan 30, 1927 [id number]  Level 2 trauma  Patient ID: Karl Stevenson, male   DOB: [birth year], 87 y.o.   MRN: [id number]  [name] 11/11/2020, 9:07 AM

## 2020-11-11 NOTE — ED Provider Notes (Signed)
[facility] EMERGENCY DEPARTMENT Provider Note   CSN: [id number] Arrival date & time: 11/11/20  0848     History CC:  Fall   Karl Stevenson is a 87 y.o. male on plavix presenting to Ed with a fall, tripped at home, struck back of head and right elbow on ground, no LOC.  Arrives by EMS.  He was reporting right shoulder pain has a laceration on the right arm, but no other injuries, denies pain anywhere else.  He denies headache.  He feels at baseline.  He has  apicc line, getting home infusions with rocephin and daptomycin for chronic osteomyelitis of the foot  Son is present at bedside  HPI     No past medical history on file.  There are no problems to display for this patient.   No family history on file.     Home Medications Prior to Admission medications   Not on File    Allergies    Patient has no allergy information on record.  Review of Systems   Review of Systems  Constitutional:  Negative for chills and fever.  HENT:  Negative for ear pain and sore throat.   Eyes:  Negative for pain and visual disturbance.  Respiratory:  Negative for cough and shortness of breath.   Cardiovascular:  Negative for chest pain and palpitations.  Gastrointestinal:  Negative for abdominal pain and vomiting.  Genitourinary:  Negative for dysuria and hematuria.  Musculoskeletal:  Negative for arthralgias and back pain.  Skin:  Positive for wound. Negative for rash.  Neurological:  Negative for syncope, light-headedness and headaches.  All other systems reviewed and are negative.  Physical Exam Updated Vital Signs BP (!) 169/95   Pulse 100   Temp 98 F (36.7 C) (Oral)   Resp (!) 23   Ht 5\' 3"  (1.6 m)   Wt 60.3 kg   SpO2 100%   BMI 23.56 kg/m   Physical Exam Constitutional:      General: He is not in acute distress. HENT:     Head: Normocephalic and atraumatic.  Eyes:     Conjunctiva/sclera: Conjunctivae normal.     Pupils: Pupils are equal,  round, and reactive to light.  Cardiovascular:     Rate and Rhythm: Normal rate and regular rhythm.  Pulmonary:     Effort: Pulmonary effort is normal. No respiratory distress.  Abdominal:     General: There is no distension.     Tenderness: There is no abdominal tenderness.  Musculoskeletal:     Comments: Picc line right upper arm Right forearm with 10 cm laceration just distal to the elbow, extending through skin and adipose tissue, but does not violate the fascia or tendons  Skin:    General: Skin is warm and dry.  Neurological:     General: No focal deficit present.     Mental Status: He is alert. Mental status is at baseline.  Psychiatric:        Mood and Affect: Mood normal.        Behavior: Behavior normal.    ED Results / Procedures / Treatments   Labs (all labs ordered are listed, but only abnormal results are displayed) Labs Reviewed  COMPREHENSIVE METABOLIC PANEL - Abnormal; Notable for the following components:      Result Value   Glucose, Bld 111 (*)    BUN 34 (*)    Creatinine, Ser 1.55 (*)    Calcium 8.8 (*)    Total  Protein 6.3 (*)    Albumin 3.0 (*)    GFR, Estimated 42 (*)    All other components within normal limits  CBC - Abnormal; Notable for the following components:   RBC 2.71 (*)    Hemoglobin 9.8 (*)    HCT 29.6 (*)    MCV 109.2 (*)    MCH 36.2 (*)    All other components within normal limits  I-STAT CHEM 8, ED - Abnormal; Notable for the following components:   BUN 35 (*)    Creatinine, Ser 1.50 (*)    Glucose, Bld 108 (*)    Calcium, Ion 1.13 (*)    Hemoglobin 10.5 (*)    HCT 31.0 (*)    All other components within normal limits  ETHANOL  LACTIC ACID, PLASMA  PROTIME-INR  URINALYSIS, ROUTINE W REFLEX MICROSCOPIC  SAMPLE TO BLOOD BANK    EKG None  Radiology DG Shoulder Right  Result Date: 11/11/2020 CLINICAL DATA:  Larey Seat at home.  Right shoulder pain. EXAM: RIGHT SHOULDER - 2+ VIEW COMPARISON:  Current cervical CT. FINDINGS:  Fracture of the medial clavicle, displaced approximately 1 cm, distal fracture fragment inferior to the proximal component. No other fractures. Glenohumeral and AC joints are normally aligned. There are calcifications adjacent to the greater tuberosity consistent with rotator cuff calcific tendinitis. Soft tissues are unremarkable. IMPRESSION: 1. Mildly displaced fracture of the medial right clavicle, which was noted on the current cervical spine CT. 2. No right shoulder fracture or dislocation. Electronically Signed   By: Amie Portland M.D.   On: 11/11/2020 09:59   DG Elbow Complete Right  Result Date: 11/11/2020 CLINICAL DATA:  87 year old with right elbow injury and laceration. EXAM: RIGHT ELBOW - COMPLETE 3+ VIEW COMPARISON:  None. FINDINGS: Soft tissue irregularity and lucency at the elbow and proximal forearm is compatible with history of laceration. Patient has a PICC line in the right upper arm. Negative for fracture or dislocation. No large joint effusion. IMPRESSION: No acute bone abnormality to the right elbow. Soft tissue irregularity is compatible with known laceration. Electronically Signed   By: Richarda Overlie M.D.   On: 11/11/2020 10:00   CT HEAD WO CONTRAST  Result Date: 11/11/2020 CLINICAL DATA:  Larey Seat at home.  On blood thinners. EXAM: CT HEAD WITHOUT CONTRAST CT CERVICAL SPINE WITHOUT CONTRAST TECHNIQUE: Multidetector CT imaging of the head and cervical spine was performed following the standard protocol without intravenous contrast. Multiplanar CT image reconstructions of the cervical spine were also generated. COMPARISON:  04/07/2020 FINDINGS: CT HEAD FINDINGS Brain: No evidence of acute infarction, hemorrhage, hydrocephalus, extra-axial collection or mass lesion/mass effect. Old caudate nucleus head lacunar infarcts. Age-appropriate volume loss. Mild chronic microvascular ischemic change. Vascular: No hyperdense vessel or unexpected calcification. Skull: Normal. Negative for fracture or focal  lesion. Sinuses/Orbits: Globes and orbits are unremarkable. Sinuses essentially clear. Partial right middle ear cavity opacification, unchanged from the prior CT. Other: Minimal right forehead scalp contusion. CT CERVICAL SPINE FINDINGS Alignment: Normal. Skull base and vertebrae: No acute fracture. No primary bone lesion or focal pathologic process. Soft tissues and spinal canal: No prevertebral fluid or swelling. No visible canal hematoma. Disc levels: Moderate loss of disc height from C4-C5 through C7-T1 with disc bulging. On the sagittal reconstructed images, a central disc protrusion is suggested at the C4-C5 level. Upper chest: Acute fracture of the medial right clavicle, mildly displaced by approximately 4-5 mm. No other fractures. Small bilateral pleural effusions. Interstitial thickening noted in the upper lungs.  Other: None. IMPRESSION: HEAD CT 1. No acute intracranial abnormalities. 2. No skull fracture. CERVICAL CT 1. No vertebral fracture or malalignment. 2. Fracture of the medial right clavicle, mildly displaced by 4-5 mm. 3. Small bilateral pleural effusions. Interstitial thickening in the visualized upper lobes which may reflect interstitial edema. Electronically Signed   By: Amie Portland M.D.   On: 11/11/2020 09:43   CT Cervical Spine Wo Contrast  Result Date: 11/11/2020 CLINICAL DATA:  Larey Seat at home.  On blood thinners. EXAM: CT HEAD WITHOUT CONTRAST CT CERVICAL SPINE WITHOUT CONTRAST TECHNIQUE: Multidetector CT imaging of the head and cervical spine was performed following the standard protocol without intravenous contrast. Multiplanar CT image reconstructions of the cervical spine were also generated. COMPARISON:  04/07/2020 FINDINGS: CT HEAD FINDINGS Brain: No evidence of acute infarction, hemorrhage, hydrocephalus, extra-axial collection or mass lesion/mass effect. Old caudate nucleus head lacunar infarcts. Age-appropriate volume loss. Mild chronic microvascular ischemic change. Vascular: No  hyperdense vessel or unexpected calcification. Skull: Normal. Negative for fracture or focal lesion. Sinuses/Orbits: Globes and orbits are unremarkable. Sinuses essentially clear. Partial right middle ear cavity opacification, unchanged from the prior CT. Other: Minimal right forehead scalp contusion. CT CERVICAL SPINE FINDINGS Alignment: Normal. Skull base and vertebrae: No acute fracture. No primary bone lesion or focal pathologic process. Soft tissues and spinal canal: No prevertebral fluid or swelling. No visible canal hematoma. Disc levels: Moderate loss of disc height from C4-C5 through C7-T1 with disc bulging. On the sagittal reconstructed images, a central disc protrusion is suggested at the C4-C5 level. Upper chest: Acute fracture of the medial right clavicle, mildly displaced by approximately 4-5 mm. No other fractures. Small bilateral pleural effusions. Interstitial thickening noted in the upper lungs. Other: None. IMPRESSION: HEAD CT 1. No acute intracranial abnormalities. 2. No skull fracture. CERVICAL CT 1. No vertebral fracture or malalignment. 2. Fracture of the medial right clavicle, mildly displaced by 4-5 mm. 3. Small bilateral pleural effusions. Interstitial thickening in the visualized upper lobes which may reflect interstitial edema. Electronically Signed   By: Amie Portland M.D.   On: 11/11/2020 09:43   DG Chest Portable 1 View  Result Date: 11/11/2020 CLINICAL DATA:  LEVEL 2 fall on blood thinners - tripped and fell at home today, having pain in right shoulder after fall EXAM: PORTABLE CHEST 1 VIEW COMPARISON:  08/19/2016 FINDINGS: Stable changes from prior CABG surgery. Cardiac silhouette is normal in size. No mediastinal or hilar masses. Lungs demonstrate diffuse bilateral interstitial thickening. There is hazy opacity at both lung bases consistent with a combination of small effusions and atelectasis. Scarring is noted at the apices, right greater than left, stable. Right-sided PICC has  its tip in the lower superior vena cava. Skeletal structures are demineralized, grossly intact. IMPRESSION: 1. Interstitial thickening with small bilateral pleural effusions. Suspect mild interstitial pulmonary edema likely on the basis of congestive heart failure. Electronically Signed   By: Amie Portland M.D.   On: 11/11/2020 09:06    Procedures .Marland KitchenLaceration Repair  Date/Time: 11/11/2020 10:39 AM Performed by: Terald Sleeper, MD Authorized by: Terald Sleeper, MD   Consent:    Consent obtained:  Verbal   Consent given by:  Patient and guardian   Risks, benefits, and alternatives were discussed: yes     Risks discussed:  Infection, pain, poor cosmetic result and poor wound healing Universal protocol:    Procedure explained and questions answered to patient or proxy's satisfaction: yes     Relevant documents present and  verified: yes     Test results available: yes     Imaging studies available: yes     Site/side marked: yes     Immediately prior to procedure, a time out was called: yes     Patient identity confirmed:  Verbally with patient and arm band Anesthesia:    Anesthesia method:  None Laceration details:    Location:  Shoulder/arm   Shoulder/arm location:  R lower arm   Length (cm):  10   Depth (mm):  5 Pre-procedure details:    Preparation:  Patient was prepped and draped in usual sterile fashion Exploration:    Limited defect created (wound extended): no     Imaging obtained: x-ray     Imaging outcome: foreign body noted     Wound exploration: wound explored through full range of motion     Wound extent: areolar tissue violated     Wound extent: no fascia violation noted, no foreign bodies/material noted, no muscle damage noted, no nerve damage noted, no tendon damage noted, no underlying fracture noted and no vascular damage noted     Contaminated: no   Treatment:    Area cleansed with:  Saline   Amount of cleaning:  Extensive   Irrigation solution:  Sterile  saline   Debridement:  None   Undermining:  None   Scar revision: no   Skin repair:    Repair method:  Sutures   Suture size:  4-0 and 3-0   Suture material:  Prolene   Suture technique:  Simple interrupted   Number of sutures:  9 Approximation:    Approximation:  Close Repair type:    Repair type:  Simple Post-procedure details:    Dressing:  Non-adherent dressing   Procedure completion:  Tolerated well, no immediate complications Comments:     Skin layer closed, skin loose, no violation of tendon underneath   Medications Ordered in ED Medications  cefTRIAXone (ROCEPHIN) 2 g in sodium chloride 0.9 % 100 mL IVPB (0 g Intravenous Stopped 11/11/20 1209)  DAPTOmycin (CUBICIN) 500 mg in sodium chloride 0.9 % IVPB (0 mg Intravenous Stopped 11/11/20 1324)    ED Course  I have reviewed the triage vital signs and the nursing notes.  Pertinent labs & imaging results that were available during my care of the patient were reviewed by me and considered in my medical decision making (see chart for details).  [facility] - no acute intracranial bleed noted, C spine without acute fracture Trauma labs drawn and sent on arrival - these appear stable for him  DG shoulder with minimally displaced right clavicular fracture, closed fracture - will need sling, can f/u with ortho n 1-2 weeks No fracture noted at the elbow Remainder of trauma evaluation looks okay ECG reviewed and at baseline   Wound was irrigated and closed as noted He's already on antibiotics at home for his other wounds   Clinical Course as of 11/11/20 1614  Mon Nov 11, 2020  1139 Laceration was repaired.  The patient is now getting his expected antibiotic infusions.  We will then need to ambulate him to see if he is stable for discharge with a cane.  His son is present at bedside. [MT]  1340 Patient is completed his antibiotic infusions here in the ED.  He was able to ambulate steadily  with a cane.  Our social workers talk to the family about home health.  I do believe he is  safe at this time for discharge. [MT]    Clinical Course User Index [MT] Keondria Siever, Kermit Balo, MD    Final Clinical Impression(s) / ED Diagnoses Final diagnoses:  Fall, initial encounter  Closed nondisplaced fracture of shaft of right clavicle, initial encounter  Laceration of right upper extremity, initial encounter    Rx / DC Orders ED Discharge Orders     None        Terald Sleeper, MD 11/11/20 1614

## 2020-11-11 NOTE — Progress Notes (Signed)
Pharmacy Antibiotic Note  MARCELUS DUBBERLY is a 87 y.o. male admitted on 11/11/2020 with  osteomyelitis .  Pharmacy has been consulted for daptomycin dosing.  Plan: Daptomycin 500mg  IV q48h (outpatient therapy)  - last dose on Saturday, due for a dose today. Confirmed with wife via phone.  Height: 5\' 3"  (160 cm) Weight: 60.3 kg (133 lb) IBW/kg (Calculated) : 56.9  Temp (24hrs), Avg:98.2 F (36.8 C), Min:98.2 F (36.8 C), Max:98.2 F (36.8 C)  Recent Labs  Lab 11/11/20 0858 11/11/20 0907  WBC 7.2  --   CREATININE 1.55* 1.50*  LATICACIDVEN 1.1  --     Estimated Creatinine Clearance: 25.8 mL/min (A) (by C-G formula based on SCr of 1.5 mg/dL (H)).    Thank you for allowing pharmacy to be a part of this patient's care.  01/12/21, PharmD, BCCCP Emergency Medicine Clinical Pharmacist  Please check AMION for all [facility] Pharmacy phone numbers After 10:00 PM, call Main Pharmacy [phone]

## 2020-11-12 ENCOUNTER — Ambulatory Visit: Payer: Medicare PPO | Admitting: Vascular Surgery

## 2020-11-12 DIAGNOSIS — L97528 Non-pressure chronic ulcer of other part of left foot with other specified severity: Secondary | ICD-10-CM | POA: Diagnosis not present

## 2020-11-12 DIAGNOSIS — M86171 Other acute osteomyelitis, right ankle and foot: Secondary | ICD-10-CM | POA: Diagnosis not present

## 2020-11-12 DIAGNOSIS — I255 Ischemic cardiomyopathy: Secondary | ICD-10-CM | POA: Diagnosis not present

## 2020-11-12 DIAGNOSIS — I70221 Atherosclerosis of native arteries of extremities with rest pain, right leg: Secondary | ICD-10-CM | POA: Diagnosis not present

## 2020-11-12 DIAGNOSIS — M869 Osteomyelitis, unspecified: Secondary | ICD-10-CM | POA: Diagnosis not present

## 2020-11-12 DIAGNOSIS — I5022 Chronic systolic (congestive) heart failure: Secondary | ICD-10-CM | POA: Diagnosis not present

## 2020-11-12 DIAGNOSIS — N189 Chronic kidney disease, unspecified: Secondary | ICD-10-CM | POA: Diagnosis not present

## 2020-11-12 DIAGNOSIS — I2510 Atherosclerotic heart disease of native coronary artery without angina pectoris: Secondary | ICD-10-CM | POA: Diagnosis not present

## 2020-11-12 DIAGNOSIS — I130 Hypertensive heart and chronic kidney disease with heart failure and stage 1 through stage 4 chronic kidney disease, or unspecified chronic kidney disease: Secondary | ICD-10-CM | POA: Diagnosis not present

## 2020-11-12 DIAGNOSIS — I252 Old myocardial infarction: Secondary | ICD-10-CM | POA: Diagnosis not present

## 2020-11-13 ENCOUNTER — Encounter (HOSPITAL_BASED_OUTPATIENT_CLINIC_OR_DEPARTMENT_OTHER): Payer: Medicare PPO | Admitting: Physician Assistant

## 2020-11-13 ENCOUNTER — Telehealth: Payer: Self-pay

## 2020-11-13 NOTE — Telephone Encounter (Signed)
Patient's wife is calling to follow up on patient's lab results. [facility] will be faxing over results from 11/12/20

## 2020-11-14 DIAGNOSIS — I252 Old myocardial infarction: Secondary | ICD-10-CM | POA: Diagnosis not present

## 2020-11-14 DIAGNOSIS — I130 Hypertensive heart and chronic kidney disease with heart failure and stage 1 through stage 4 chronic kidney disease, or unspecified chronic kidney disease: Secondary | ICD-10-CM | POA: Diagnosis not present

## 2020-11-14 DIAGNOSIS — I255 Ischemic cardiomyopathy: Secondary | ICD-10-CM | POA: Diagnosis not present

## 2020-11-14 DIAGNOSIS — I2510 Atherosclerotic heart disease of native coronary artery without angina pectoris: Secondary | ICD-10-CM | POA: Diagnosis not present

## 2020-11-14 DIAGNOSIS — M869 Osteomyelitis, unspecified: Secondary | ICD-10-CM | POA: Diagnosis not present

## 2020-11-14 DIAGNOSIS — N189 Chronic kidney disease, unspecified: Secondary | ICD-10-CM | POA: Diagnosis not present

## 2020-11-14 DIAGNOSIS — L97528 Non-pressure chronic ulcer of other part of left foot with other specified severity: Secondary | ICD-10-CM | POA: Diagnosis not present

## 2020-11-14 DIAGNOSIS — M86171 Other acute osteomyelitis, right ankle and foot: Secondary | ICD-10-CM | POA: Diagnosis not present

## 2020-11-14 DIAGNOSIS — I70221 Atherosclerosis of native arteries of extremities with rest pain, right leg: Secondary | ICD-10-CM | POA: Diagnosis not present

## 2020-11-14 DIAGNOSIS — I5022 Chronic systolic (congestive) heart failure: Secondary | ICD-10-CM | POA: Diagnosis not present

## 2020-11-15 DIAGNOSIS — I739 Peripheral vascular disease, unspecified: Secondary | ICD-10-CM | POA: Diagnosis not present

## 2020-11-15 DIAGNOSIS — I2510 Atherosclerotic heart disease of native coronary artery without angina pectoris: Secondary | ICD-10-CM | POA: Diagnosis not present

## 2020-11-15 DIAGNOSIS — M86671 Other chronic osteomyelitis, right ankle and foot: Secondary | ICD-10-CM | POA: Diagnosis not present

## 2020-11-15 DIAGNOSIS — R42 Dizziness and giddiness: Secondary | ICD-10-CM | POA: Diagnosis not present

## 2020-11-15 DIAGNOSIS — I10 Essential (primary) hypertension: Secondary | ICD-10-CM | POA: Diagnosis not present

## 2020-11-19 DIAGNOSIS — I255 Ischemic cardiomyopathy: Secondary | ICD-10-CM | POA: Diagnosis not present

## 2020-11-19 DIAGNOSIS — I252 Old myocardial infarction: Secondary | ICD-10-CM | POA: Diagnosis not present

## 2020-11-19 DIAGNOSIS — I5022 Chronic systolic (congestive) heart failure: Secondary | ICD-10-CM | POA: Diagnosis not present

## 2020-11-19 DIAGNOSIS — N189 Chronic kidney disease, unspecified: Secondary | ICD-10-CM | POA: Diagnosis not present

## 2020-11-19 DIAGNOSIS — M869 Osteomyelitis, unspecified: Secondary | ICD-10-CM | POA: Diagnosis not present

## 2020-11-19 DIAGNOSIS — L97528 Non-pressure chronic ulcer of other part of left foot with other specified severity: Secondary | ICD-10-CM | POA: Diagnosis not present

## 2020-11-19 DIAGNOSIS — I130 Hypertensive heart and chronic kidney disease with heart failure and stage 1 through stage 4 chronic kidney disease, or unspecified chronic kidney disease: Secondary | ICD-10-CM | POA: Diagnosis not present

## 2020-11-19 DIAGNOSIS — M86171 Other acute osteomyelitis, right ankle and foot: Secondary | ICD-10-CM | POA: Diagnosis not present

## 2020-11-19 DIAGNOSIS — I70221 Atherosclerosis of native arteries of extremities with rest pain, right leg: Secondary | ICD-10-CM | POA: Diagnosis not present

## 2020-11-19 DIAGNOSIS — I2510 Atherosclerotic heart disease of native coronary artery without angina pectoris: Secondary | ICD-10-CM | POA: Diagnosis not present

## 2020-11-20 DIAGNOSIS — L97528 Non-pressure chronic ulcer of other part of left foot with other specified severity: Secondary | ICD-10-CM | POA: Diagnosis not present

## 2020-11-20 DIAGNOSIS — N189 Chronic kidney disease, unspecified: Secondary | ICD-10-CM | POA: Diagnosis not present

## 2020-11-20 DIAGNOSIS — I252 Old myocardial infarction: Secondary | ICD-10-CM | POA: Diagnosis not present

## 2020-11-20 DIAGNOSIS — M869 Osteomyelitis, unspecified: Secondary | ICD-10-CM | POA: Diagnosis not present

## 2020-11-20 DIAGNOSIS — I70221 Atherosclerosis of native arteries of extremities with rest pain, right leg: Secondary | ICD-10-CM | POA: Diagnosis not present

## 2020-11-20 DIAGNOSIS — I130 Hypertensive heart and chronic kidney disease with heart failure and stage 1 through stage 4 chronic kidney disease, or unspecified chronic kidney disease: Secondary | ICD-10-CM | POA: Diagnosis not present

## 2020-11-20 DIAGNOSIS — I5022 Chronic systolic (congestive) heart failure: Secondary | ICD-10-CM | POA: Diagnosis not present

## 2020-11-20 DIAGNOSIS — I2510 Atherosclerotic heart disease of native coronary artery without angina pectoris: Secondary | ICD-10-CM | POA: Diagnosis not present

## 2020-11-20 DIAGNOSIS — I255 Ischemic cardiomyopathy: Secondary | ICD-10-CM | POA: Diagnosis not present

## 2020-11-21 DIAGNOSIS — M86171 Other acute osteomyelitis, right ankle and foot: Secondary | ICD-10-CM | POA: Diagnosis not present

## 2020-11-25 ENCOUNTER — Telehealth: Payer: Self-pay

## 2020-11-25 NOTE — Telephone Encounter (Signed)
Office visit changed to phone/video visit as wife called triage to report the patient fell this weekend and dislocated his shoulder. Unable to tolerate travel currently but still wants to review labs/plan.   Appt changed to virtual.  Rosanna Randy, RN

## 2020-11-26 ENCOUNTER — Encounter: Payer: Self-pay | Admitting: Internal Medicine

## 2020-11-26 ENCOUNTER — Other Ambulatory Visit: Payer: Self-pay

## 2020-11-26 ENCOUNTER — Telehealth (INDEPENDENT_AMBULATORY_CARE_PROVIDER_SITE_OTHER): Payer: Medicare PPO | Admitting: Internal Medicine

## 2020-11-26 ENCOUNTER — Emergency Department (HOSPITAL_COMMUNITY)
Admission: EM | Admit: 2020-11-26 | Discharge: 2020-11-27 | Disposition: A | Discharge status: Home / Self Care | Payer: Medicare PPO | Attending: Emergency Medicine | Admitting: Emergency Medicine

## 2020-11-26 ENCOUNTER — Encounter (HOSPITAL_COMMUNITY): Payer: Self-pay | Admitting: Emergency Medicine

## 2020-11-26 ENCOUNTER — Telehealth: Payer: Self-pay

## 2020-11-26 DIAGNOSIS — I130 Hypertensive heart and chronic kidney disease with heart failure and stage 1 through stage 4 chronic kidney disease, or unspecified chronic kidney disease: Secondary | ICD-10-CM | POA: Diagnosis not present

## 2020-11-26 DIAGNOSIS — I2510 Atherosclerotic heart disease of native coronary artery without angina pectoris: Secondary | ICD-10-CM | POA: Diagnosis not present

## 2020-11-26 DIAGNOSIS — Z79899 Other long term (current) drug therapy: Secondary | ICD-10-CM | POA: Insufficient documentation

## 2020-11-26 DIAGNOSIS — M869 Osteomyelitis, unspecified: Secondary | ICD-10-CM | POA: Diagnosis not present

## 2020-11-26 DIAGNOSIS — L97528 Non-pressure chronic ulcer of other part of left foot with other specified severity: Secondary | ICD-10-CM | POA: Diagnosis not present

## 2020-11-26 DIAGNOSIS — I255 Ischemic cardiomyopathy: Secondary | ICD-10-CM | POA: Diagnosis not present

## 2020-11-26 DIAGNOSIS — Z955 Presence of coronary angioplasty implant and graft: Secondary | ICD-10-CM | POA: Diagnosis not present

## 2020-11-26 DIAGNOSIS — W010XXA Fall on same level from slipping, tripping and stumbling without subsequent striking against object, initial encounter: Secondary | ICD-10-CM | POA: Diagnosis not present

## 2020-11-26 DIAGNOSIS — I5022 Chronic systolic (congestive) heart failure: Secondary | ICD-10-CM | POA: Insufficient documentation

## 2020-11-26 DIAGNOSIS — W19XXXA Unspecified fall, initial encounter: Secondary | ICD-10-CM

## 2020-11-26 DIAGNOSIS — I959 Hypotension, unspecified: Secondary | ICD-10-CM | POA: Diagnosis not present

## 2020-11-26 DIAGNOSIS — S51811A Laceration without foreign body of right forearm, initial encounter: Secondary | ICD-10-CM | POA: Insufficient documentation

## 2020-11-26 DIAGNOSIS — S51011A Laceration without foreign body of right elbow, initial encounter: Secondary | ICD-10-CM | POA: Insufficient documentation

## 2020-11-26 DIAGNOSIS — Z7982 Long term (current) use of aspirin: Secondary | ICD-10-CM | POA: Diagnosis not present

## 2020-11-26 DIAGNOSIS — J449 Chronic obstructive pulmonary disease, unspecified: Secondary | ICD-10-CM | POA: Diagnosis not present

## 2020-11-26 DIAGNOSIS — S61511A Laceration without foreign body of right wrist, initial encounter: Secondary | ICD-10-CM | POA: Diagnosis not present

## 2020-11-26 DIAGNOSIS — N183 Chronic kidney disease, stage 3 unspecified: Secondary | ICD-10-CM | POA: Insufficient documentation

## 2020-11-26 DIAGNOSIS — I70221 Atherosclerosis of native arteries of extremities with rest pain, right leg: Secondary | ICD-10-CM | POA: Diagnosis not present

## 2020-11-26 DIAGNOSIS — I10 Essential (primary) hypertension: Secondary | ICD-10-CM | POA: Diagnosis not present

## 2020-11-26 DIAGNOSIS — R58 Hemorrhage, not elsewhere classified: Secondary | ICD-10-CM | POA: Diagnosis not present

## 2020-11-26 DIAGNOSIS — S59911A Unspecified injury of right forearm, initial encounter: Secondary | ICD-10-CM | POA: Diagnosis present

## 2020-11-26 DIAGNOSIS — Y92009 Unspecified place in unspecified non-institutional (private) residence as the place of occurrence of the external cause: Secondary | ICD-10-CM | POA: Diagnosis not present

## 2020-11-26 DIAGNOSIS — N189 Chronic kidney disease, unspecified: Secondary | ICD-10-CM | POA: Diagnosis not present

## 2020-11-26 DIAGNOSIS — I252 Old myocardial infarction: Secondary | ICD-10-CM | POA: Diagnosis not present

## 2020-11-26 DIAGNOSIS — M86171 Other acute osteomyelitis, right ankle and foot: Secondary | ICD-10-CM | POA: Diagnosis not present

## 2020-11-26 LAB — COMPREHENSIVE METABOLIC PANEL
ALT: 23 U/L (ref 0–44)
AST: 28 U/L (ref 15–41)
Albumin: 3.4 g/dL — ABNORMAL LOW (ref 3.5–5.0)
Alkaline Phosphatase: 76 U/L (ref 38–126)
Anion gap: 13 (ref 5–15)
BUN: 56 mg/dL — ABNORMAL HIGH (ref 8–23)
CO2: 24 mmol/L (ref 22–32)
Calcium: 9.2 mg/dL (ref 8.9–10.3)
Chloride: 97 mmol/L — ABNORMAL LOW (ref 98–111)
Creatinine, Ser: 1.64 mg/dL — ABNORMAL HIGH (ref 0.61–1.24)
GFR, Estimated: 39 mL/min — ABNORMAL LOW (ref >60)
Glucose, Bld: 128 mg/dL — ABNORMAL HIGH (ref 70–99)
Potassium: 4.6 mmol/L (ref 3.5–5.1)
Sodium: 134 mmol/L — ABNORMAL LOW (ref 135–145)
Total Bilirubin: 0.4 mg/dL (ref 0.3–1.2)
Total Protein: 6.6 g/dL (ref 6.5–8.1)

## 2020-11-26 LAB — PROTIME-INR
INR: 1.1 (ref 0.8–1.2)
Prothrombin Time: 14.3 seconds (ref 11.4–15.2)

## 2020-11-26 LAB — CBC WITH DIFFERENTIAL/PLATELET
Abs Immature Granulocytes: 0.02 10*3/uL (ref 0.00–0.07)
Basophils Absolute: 0 10*3/uL (ref 0.0–0.1)
Basophils Relative: 0 %
Eosinophils Absolute: 0.2 10*3/uL (ref 0.0–0.5)
Eosinophils Relative: 4 %
HCT: 29 % — ABNORMAL LOW (ref 39.0–52.0)
Hemoglobin: 9.1 g/dL — ABNORMAL LOW (ref 13.0–17.0)
Immature Granulocytes: 0 %
Lymphocytes Relative: 18 %
Lymphs Abs: 1 10*3/uL (ref 0.7–4.0)
MCH: 34.9 pg — ABNORMAL HIGH (ref 26.0–34.0)
MCHC: 31.4 g/dL (ref 30.0–36.0)
MCV: 111.1 fL — ABNORMAL HIGH (ref 80.0–100.0)
Monocytes Absolute: 0.6 10*3/uL (ref 0.1–1.0)
Monocytes Relative: 12 %
Neutro Abs: 3.6 10*3/uL (ref 1.7–7.7)
Neutrophils Relative %: 66 %
Platelets: 215 10*3/uL (ref 150–400)
RBC: 2.61 MIL/uL — ABNORMAL LOW (ref 4.22–5.81)
RDW: 14.1 % (ref 11.5–15.5)
WBC: 5.5 10*3/uL (ref 4.0–10.5)
nRBC: 0 % (ref 0.0–0.2)

## 2020-11-26 NOTE — Telephone Encounter (Signed)
Spoke to [place] at [facility] to inform of verbal orders per Dr.Comer - to remove PICC line after last dose of Ceftriaxone and Daptomycin after 12/02/2020. Mary verbalized her understanding.    Cyanne Delmar Lesli Albee, CMA

## 2020-11-26 NOTE — Progress Notes (Signed)
   Subjective:    Patient ID: Karl Stevenson, male    DOB: Aug 21, 1926, 87 y.o.   MRN: [id number]  HPI I connected with  Karl Stevenson on 11/26/20 by phone and verified that I am speaking with the correct person using two identifiers.   I discussed the limitations of evaluation and management by telemedicine. The patient expressed understanding and agreed to proceed.  Location: Patient - [place] residence Physician - clinic  Duration of visit:  17 minutes  HPI:  He is called for follow up of osteomyelitis.   He has an ulcer that developed and an MRI in June concerning for osteomyelitis.  He was previous seen by vascular surgery for critical limb ischemia and underwent successful angioplasty.  He was started on empiric daptomycin and ceftriaxone which was started on 6/14 after an oral antibiotic bridge until this was started.   Plan is for IV antibiotics through July 25th.  He was to come in person today for the visit but recently fell and broke his shoulder so not able to come.  His wife is on the phone visit as well.  He reports some yellow drainage from his wound with no surrounding erythema.  He states the wound is not healing much.  He has not been back to Dr. Mikey Bussing for wound care recently, canceled his appt this week.  He is asking about podiatry follow up.   Review of Systems  Constitutional:  Negative for chills and fever.  Gastrointestinal:  Negative for diarrhea.  Skin:  Negative for rash.      Objective:   Physical Exam Neurological:     Mental Status: He is alert.  Psychiatric:        Mood and Affect: Mood normal.          Assessment & Plan:

## 2020-11-26 NOTE — ED Triage Notes (Addendum)
Pt bib [facility] from home for fall. Pt was at home and slipped, did not hit head. no LOC no dizziness. Has lac to R elbow and R hand. Pt does take Plavix. Pt does have a PICC in R arm for antibiotic tx due to infection in foot. Had a fall on 7/4 w/ injury to R elbow, and bruising R ribs, was seen in ED for that fall.  128/78 80HR 16RR 99% RA

## 2020-11-26 NOTE — Assessment & Plan Note (Signed)
I discussed the options with him including continuing antibiotics vs stopping and observing off antibiotics.  I am somewhat concerned with the yellowish discharge he notes but hard via telephone to truly assess his osteomyelitis and wound area.  In our discussion, he opted for stopping antibiotics and observing off antibiotics and he will arrange follow up with podiatry or wound care for observation in person.  I think this is a reasonable option - if his wound again worsens, flares, then further antibiotics not likely to improve it much and a surgical approach would likely be indicated.    He will call if there are any concerns after stopping the antibiotics but otherwise will follow up here as needed. Will notify home health about removal of the picc line and stopping antibiotics.

## 2020-11-27 ENCOUNTER — Telehealth: Payer: Self-pay | Admitting: *Deleted

## 2020-11-27 NOTE — ED Notes (Signed)
Wound care provided to top of RT hand and RT elbow area. Steri strips applied; xeroform and Kerlix applied. Sling in place.

## 2020-11-27 NOTE — ED Provider Notes (Signed)
[facility] EMERGENCY DEPARTMENT Provider Note   CSN: [id number] Arrival date & time: 11/26/20  2152     History Chief Complaint  Patient presents with   Karl Stevenson is a 87 y.o. male.  87 year old male presents emerged from today secondary to a skin tear and bleeding to his right wrist and bleeding from a previous wound to his right elbow.  Patient was seen here couple weeks ago for a mechanical fall which time he broke his clavicle and has been in a sling.  He also had a laceration to his right elbow which was repaired and the stitches were removed earlier today.  He got his feet caught up and had a controlled fall to the ground.  Did not hit his head did not pass out and is not really in any significant pain but had a skin tear to his right dorsal wrist that started bleeding and then the previously repaired wound to his right elbow also started bleeding.  Does not have any new pain anywhere else.  He states his shoes that he is wearing for his toe infection are quite large and cumbersome and he got those caught up and that is how he lost his balance.  His son is at bedside which confirms the same.   Fall      Past Medical History:  Diagnosis Date   AAA (abdominal aortic aneurysm) (HCC)    a. Ectatic abdominal aorta with fusiform dilitation 3.4x3.4 and moderate thrombus burden 12/2010   Bradycardia    a. previous intolerance to beta blockers - low dose toprol started 10/2011 for tachycardia but had rash and was discontinued;  b. tolerating low dose propranolol.   Carotid artery occlusion    a. s/p L CEA in 2010;  b. 06/2014 Carotid U/S: stable LICA CEA site w/o significant stenosis bilat.   Chronic systolic heart failure (HCC)    a. 02/2015 Echo:  35-40% (echo 11/2010); c. 02/2015 Echo: EF 20-25%, diff HK, mid-apicalanteroseptal and apical AK, Gr 1 DD, mild MR.   COPD (chronic obstructive pulmonary disease) (HCC)    Coronary artery disease    a. 1969  s/p MI;  b. S/P CABG 1987;  c. S/P redo 2008;  d. 07/2012 Cath: stable->Med Rx, EF 40%; e. 02/2014 MV: anterior and posterolateral/apical infarct with mild peri-infarct ischemia->Med Rx; f. 02/2015 Cath: LM 100d, LAD 100ost, LCX 100ost, OM2 100, VG->OM3->OM4 nl, VG-> LAD nl (Y from VG->OM), VG->OM2 100p.   Crohn's disease (HCC)    a. s/p colon resection   Gout    Hyperlipidemia    Hypertension    Ischemic cardiomyopathy    a. EF 34% (Lexiscan 06/2011); b. 35-40% (echo 11/2010); c. 02/2015 Echo: EF 20-25%, diff HK, mid-apicalanteroseptal and apical AK, Gr 1 DD, mild MR.   Peripheral vascular disease (HCC)    a. Carotid dz s/p L CEA, RAS, AAA, LE dz   RBBB    Renal artery stenosis (HCC)    a. Dopplers 12/2010 - 1-59% R ostial renal artery stenosis, 2 L renal arteries both widely patent   Syncope    Tachycardia    a. 10/2011 - atrial tach vs avnrt - BB started.   Ventricular tachycardia (HCC)    a. 02/2015 ->amio 200 daily added-->later switched to mexilitene.    Patient Active Problem List   Diagnosis Date Noted   Osteomyelitis (HCC) 10/16/2020   Medication monitoring encounter 10/16/2020   Bilateral impacted cerumen 07/25/2019  Mixed conductive and sensorineural hearing loss of right ear with restricted hearing of left ear 04/27/2016   Otorrhea, right 04/27/2016   Perforation of right tympanic membrane 04/27/2016   Orthostatic hypotension 10/25/2015   Dehydration 10/24/2015   Nausea and vomiting 10/24/2015   Diarrhea 10/24/2015   HLD (hyperlipidemia) 08/15/2015   Midsternal chest pain 07/28/2015   Syncope 04/27/2015   Ventricular tachycardia (HCC) 03/07/2015   NSVT (nonsustained ventricular tachycardia) (HCC) 03/05/2015   Unstable angina pectoris (HCC) 02/12/2014   Abdominal pain, unspecified site 02/14/2013    Class: Acute   SBO (small bowel obstruction) (HCC) 02/10/2013   Ankle edema 09/02/2012   Chronic venous insufficiency 09/02/2012   Unstable angina (HCC) 07/28/2012   CKD  (chronic kidney disease), stage III (HCC) 07/28/2012   Atherosclerosis of native arteries of the extremities, unspecified 10/30/2011   Tachycardia 10/18/2011   Bradycardia 10/18/2011   RBBB 10/17/2011   Chest pain with moderate risk of acute coronary syndrome 10/17/2011   Sinus tachycardia 10/17/2011   Aftercare following surgery of the circulatory system, NEC 09/11/2011   Bilateral femoral artery stenosis (HCC) 09/11/2011   Hypotension 08/27/2011   Peripheral vascular disease, unspecified (HCC) 07/24/2011   Ischemic cardiomyopathy 06/16/2011   Chronic systolic heart failure (HCC)    Occlusion and stenosis of carotid artery without mention of cerebral infarction 06/05/2011   Pain, limb, left 06/05/2011   Intermittent claudication (HCC) 06/05/2011   Carotid stenosis 06/05/2011   Atherosclerosis of renal artery (HCC) 01/06/2011   Hx of CABG '87, '08 11/21/2010   HTN (hypertension) 11/21/2010   PVD (peripheral vascular disease) (HCC) 11/21/2010   Crohn's disease (HCC)    Anemia    Anemia     Past Surgical History:  Procedure Laterality Date   ABDOMINAL AORTAGRAM N/A 06/11/2011   Procedure: ABDOMINAL [name];  Surgeon: [name], MD;  Location: [facility] CATH LAB;  Service: Cardiovascular;  Laterality: N/A;   ABDOMINAL AORTOGRAM W/LOWER EXTREMITY N/A 09/05/2020   Procedure: ABDOMINAL AORTOGRAM W/LOWER EXTREMITY;  Surgeon: [name], MD;  Location: [facility] INVASIVE CV LAB;  Service: Cardiovascular;  Laterality: N/A;   CARDIAC CATHETERIZATION  07/28/2012   Native 3v CAD, continued patency of the SVG-OM1-LAD and SVG-OM2-left PDA. LVEF 40% with multiple WMAs   CARDIAC CATHETERIZATION N/A 03/08/2015   Procedure: Left Heart Cath and Coronary Angiography;  Surgeon: [name], MD;  Location: [facility] INVASIVE CV LAB;  Service: Cardiovascular;  Laterality: N/A;   CAROTID ENDARTERECTOMY Left 2010   CATARACT EXTRACTION, BILATERAL     COLON RESECTION  2008   COLON SURGERY     CORONARY  ANGIOPLASTY     CORONARY ARTERY BYPASS GRAFT  10/31/85 & 08/19/06   ENDARTERECTOMY  08/26/2011   Procedure: ENDARTERECTOMY ILIAC;  Surgeon: [name], MD;  Location: [facility] OR;  Service: Vascular;  Laterality: Right;  Right Femoral Artery Endarterectomy with vascu guard patch angioplasty & intraoperative arteriogram.   EYE SURGERY     FEMORAL ENDARTERECTOMY Left  06/2011   ileofemoral endarterectomy with bovine patch angioplasty   IR FLUORO GUIDE CV LINE RIGHT  10/22/2020   IR US GUIDE VASC ACCESS RIGHT  10/22/2020   LEFT HEART CATHETERIZATION WITH CORONARY ANGIOGRAM N/A 07/28/2012   Procedure: LEFT HEART CATHETERIZATION WITH CORONARY ANGIOGRAM;  Surgeon: [name], MD;  Location: [facility] CATH LAB;  Service: Cardiovascular;  Laterality: N/A;   PERIPHERAL VASCULAR BALLOON ANGIOPLASTY Right 09/05/2020   Procedure: PERIPHERAL VASCULAR BALLOON ANGIOPLASTY;  Surgeon: [name], MD;  Location: [facility] INVASIVE  CV LAB;  Service: Cardiovascular;  Laterality: Right;  Posterior Tibial   PR VEIN BYPASS GRAFT,AORTO-FEM-POP  10/1985   TONSILLECTOMY     as a child       Family History  Problem Relation Age of Onset   Heart disease Father    Cancer Mother    COPD Mother    Deep vein thrombosis Brother    COPD Brother    CAD Brother    Anesthesia problems Neg Hx     Social History   Tobacco Use   Smoking status: Never   Smokeless tobacco: Never  Vaping Use   Vaping Use: Never used  Substance Use Topics   Alcohol use: No   Drug use: No    Home Medications Prior to Admission medications   Medication Sig Start Date End Date Taking? Authorizing Provider  acetaminophen (TYLENOL) 500 MG tablet Take 1,000 mg by mouth every 6 (six) hours as needed (pain).     [provider]  amoxicillin-clavulanate (AUGMENTIN) 500-125 MG tablet Take 1 tablet (500 mg total) by mouth 2 (two) times daily. Patient not taking: No sig reported 10/16/20   Odette FractionManandhar, Sabina, MD  aspirin EC 81 MG tablet Take 81  mg by mouth daily.    [provider]  atorvastatin (LIPITOR) 10 MG tablet Take 10 mg by mouth daily. 12/18/19   [provider]  carboxymethylcellulose (REFRESH PLUS) 0.5 % SOLN Place 1 drop into both eyes 4 (four) times daily.    [provider]  cefTRIAXone (ROCEPHIN) 10 g injection  11/04/20   [provider]  cholecalciferol (VITAMIN D3) 25 MCG (1000 UNIT) tablet Take 1,000 Units by mouth daily.    [provider]  clopidogrel (PLAVIX) 75 MG tablet TAKE 1 TABLET EVERY DAY 11/01/20   Tonny Bollmanooper, Michael, MD  DAPTOmycin (CUBICIN) 500 MG injection Inject into the vein. 11/04/20   [provider]  fexofenadine (ALLEGRA) 180 MG tablet Take 180 mg by mouth daily.    [provider]  furosemide (LASIX) 40 MG tablet Take 40 mg by mouth daily. 11/05/20   [provider]  ipratropium (ATROVENT) 0.03 % nasal spray Place 1 spray into both nostrils daily.    [provider]  isosorbide mononitrate (IMDUR) 120 MG 24 hr tablet Take 120 mg by mouth daily. 03/04/20   [provider]  isosorbide mononitrate (IMDUR) 60 MG 24 hr tablet Take 60 mg by mouth daily. 03/04/20   [provider]  losartan (COZAAR) 25 MG tablet TAKE 1 TABLET TWICE DAILY Patient taking differently: Take 25 mg by mouth in the morning and at bedtime. 07/08/20   Tonny Bollmanooper, Michael, MD  mexiletine (MEXITIL) 200 MG capsule TAKE 1 CAPSULE (200 MG TOTAL) BY MOUTH EVERY 12 HOURS. 10/29/20   Tonny Bollmanooper, Michael, MD  Multiple Vitamin (MULTIVITAMIN WITH MINERALS) TABS tablet Take 2 tablets by mouth daily.    [provider]  mupirocin ointment (BACTROBAN) 2 % Apply 1 application topically 2 (two) times daily. Apply to foot    [provider]  nitroGLYCERIN (NITROSTAT) 0.4 MG SL tablet Place 1 tablet (0.4 mg total) under the tongue every 5 (five) minutes as needed for chest pain. 01/31/20   Rosalio MacadamiaGerhardt, Lori C, NP  OVER THE COUNTER MEDICATION Take 1 capsule  by mouth daily. Nerve Health    [provider]  pantoprazole (PROTONIX) 40 MG tablet TAKE 1 TABLET EVERY DAY 11/01/20   Tonny Bollmanooper, Michael, MD  Polyethyl Glycol-Propyl Glycol (SYSTANE) 0.4-0.3 % SOLN Place  1 drop into both eyes in the morning and at bedtime.    [provider]  polyethylene glycol powder (GLYCOLAX/MIRALAX) powder Take 17 g by mouth at bedtime. Mix in 8 oz liquid and drink 10/15/15   [provider]  ranolazine (RANEXA) 500 MG 12 hr tablet TAKE 2 TABLETS TWICE DAILY 09/02/20   [name], MD  spironolactone (ALDACTONE) 25 MG tablet Take 12.5 mg by mouth daily. 11/05/20   [provider]  triamcinolone ointment (KENALOG) 0.1 % Apply 1 application topically 2 (two) times daily. Mix with Mupirocin and apply to head    [provider]  vitamin B-12 (CYANOCOBALAMIN) 1000 MCG tablet Take 2,000 mcg by mouth daily.    [provider]    Allergies    Amoxicillin, Novocain [procaine hcl], Procaine, and Metoprolol  Review of Systems   Review of Systems  All other systems reviewed and are negative.  Physical Exam Updated Vital Signs BP (!) 167/72 (BP Location: Left Arm)   Pulse 83   Temp (!) 97.4 F (36.3 C)   Resp (!) 21   Ht 5\' 3"  (1.6 m)   Wt 60 kg   SpO2 100%   BMI 23.43 kg/m   Physical Exam Vitals and nursing note reviewed.  Constitutional:      Appearance: He is well-developed.  HENT:     Head: Normocephalic and atraumatic.     Mouth/Throat:     Mouth: Mucous membranes are moist.     Pharynx: Oropharynx is clear.  Eyes:     Pupils: Pupils are equal, round, and reactive to light.  Cardiovascular:     Rate and Rhythm: Normal rate.  Pulmonary:     Effort: Pulmonary effort is normal. No respiratory distress.  Abdominal:     General: Abdomen is flat. There is no distension.  Musculoskeletal:        General: Normal range of motion.     Cervical back: Normal range of motion.  Skin:    General: Skin is warm and  dry.     Comments: Hemostatic approximated skin tear to right dorsal wrist with some surrounding dried blood  4 cm laceration to his right lateral elbow is open but hemostatic appears to be healing well  Neurological:     General: No focal deficit present.     Mental Status: He is alert.    ED Results / Procedures / Treatments   Labs (all labs ordered are listed, but only abnormal results are displayed) Labs Reviewed  CBC WITH DIFFERENTIAL/PLATELET - Abnormal; Notable for the following components:      Result Value   RBC 2.61 (*)    Hemoglobin 9.1 (*)    HCT 29.0 (*)    MCV 111.1 (*)    MCH 34.9 (*)    All other components within normal limits  COMPREHENSIVE METABOLIC PANEL - Abnormal; Notable for the following components:   Sodium 134 (*)    Chloride 97 (*)    Glucose, Bld 128 (*)    BUN 56 (*)    Creatinine, Ser 1.64 (*)    Albumin 3.4 (*)    GFR, Estimated 39 (*)    All other components within normal limits  PROTIME-INR    EKG None  Radiology No results found.  Procedures Procedures   Medications Ordered in ED Medications - No data to display  ED Course  I have reviewed the triage vital signs and the nursing notes.  Pertinent labs & imaging results that  were available during my care of the patient were reviewed by me and considered in my medical decision making (see chart for details).    MDM Rules/Calculators/A&P                           Wounds cleansed and managed per nursing.  No indication to put stitches back in the wound on his elbow and let it heal by secondary intention.  The skin tear and he also does use a couple Steri-Strips and general wound care.  He has nursing comes the house to help with that.  Final Clinical Impression(s) / ED Diagnoses Final diagnoses:  Fall, initial encounter  Skin tear of right forearm without complication, initial encounter    Rx / DC Orders ED Discharge Orders     None        Saniya Tranchina, Barbara Cower, MD 11/27/20  [phone]

## 2020-11-27 NOTE — Telephone Encounter (Signed)
Nurse Madaline Guthrie manager w/ [id number]([phone]) is calling to get a sooner appointment for the patient, has fallen twice and his wound on right foot is not improving. He fell again last night, went to ER, nothing broken but he has an area same location on left foot that is tender w/ touch. She said that his wife can come any time after 2:00 today. Can this patient be worked in today on schedule?Please advise.

## 2020-11-28 DIAGNOSIS — L97528 Non-pressure chronic ulcer of other part of left foot with other specified severity: Secondary | ICD-10-CM | POA: Diagnosis not present

## 2020-11-28 DIAGNOSIS — N189 Chronic kidney disease, unspecified: Secondary | ICD-10-CM | POA: Diagnosis not present

## 2020-11-28 DIAGNOSIS — I70221 Atherosclerosis of native arteries of extremities with rest pain, right leg: Secondary | ICD-10-CM | POA: Diagnosis not present

## 2020-11-28 DIAGNOSIS — I5022 Chronic systolic (congestive) heart failure: Secondary | ICD-10-CM | POA: Diagnosis not present

## 2020-11-28 DIAGNOSIS — I255 Ischemic cardiomyopathy: Secondary | ICD-10-CM | POA: Diagnosis not present

## 2020-11-28 DIAGNOSIS — I2510 Atherosclerotic heart disease of native coronary artery without angina pectoris: Secondary | ICD-10-CM | POA: Diagnosis not present

## 2020-11-28 DIAGNOSIS — M86171 Other acute osteomyelitis, right ankle and foot: Secondary | ICD-10-CM | POA: Diagnosis not present

## 2020-11-28 DIAGNOSIS — I252 Old myocardial infarction: Secondary | ICD-10-CM | POA: Diagnosis not present

## 2020-11-28 DIAGNOSIS — I130 Hypertensive heart and chronic kidney disease with heart failure and stage 1 through stage 4 chronic kidney disease, or unspecified chronic kidney disease: Secondary | ICD-10-CM | POA: Diagnosis not present

## 2020-11-28 DIAGNOSIS — M869 Osteomyelitis, unspecified: Secondary | ICD-10-CM | POA: Diagnosis not present

## 2020-12-02 ENCOUNTER — Ambulatory Visit (INDEPENDENT_AMBULATORY_CARE_PROVIDER_SITE_OTHER): Payer: Medicare PPO | Admitting: Cardiovascular Disease

## 2020-12-02 ENCOUNTER — Other Ambulatory Visit: Payer: Self-pay

## 2020-12-02 ENCOUNTER — Encounter: Payer: Self-pay | Admitting: Cardiovascular Disease

## 2020-12-02 ENCOUNTER — Telehealth: Payer: Self-pay | Admitting: Cardiovascular Disease

## 2020-12-02 VITALS — BP 120/60 | HR 81 | Ht 63.0 in | Wt 129.8 lb

## 2020-12-02 DIAGNOSIS — I10 Essential (primary) hypertension: Secondary | ICD-10-CM | POA: Diagnosis not present

## 2020-12-02 DIAGNOSIS — I5022 Chronic systolic (congestive) heart failure: Secondary | ICD-10-CM | POA: Diagnosis not present

## 2020-12-02 DIAGNOSIS — I2510 Atherosclerotic heart disease of native coronary artery without angina pectoris: Secondary | ICD-10-CM

## 2020-12-02 MED ORDER — SPIRONOLACTONE 25 MG PO TABS: 12.5000 mg | ORAL_TABLET | Freq: Every day | ORAL | 3 refills | Status: DC

## 2020-12-02 MED ORDER — FUROSEMIDE 40 MG PO TABS: 40.0000 mg | ORAL_TABLET | Freq: Every day | ORAL | 11 refills | Status: DC

## 2020-12-02 NOTE — Telephone Encounter (Signed)
Karl Stevenson with [facility] stated that Dr. Eloise Harman, patient's PCP, changed patient's Furosemide and Spirolactone to 1/2 tablet Monday, Wednesday, Friday, since patient's SBP was 100 and getting too low. Patient had a fall due to low BP. Home Health nurse wants to make sure this new dosing is okay with Dr. Excell Seltzer. Will forward to Dr. Excell Seltzer.

## 2020-12-02 NOTE — Telephone Encounter (Signed)
Yes this sounds like appropriate dosing.  We sent over refills for daily dosing today, not realizing he was taking these 3 days weekly.  Please keep at the Monday Wednesday Friday dosing schedule.

## 2020-12-02 NOTE — Telephone Encounter (Signed)
Called Shraddha with [facility] with Dr. Randolm Idol response. She verbalized understanding. Will update medication list.

## 2020-12-02 NOTE — Patient Instructions (Addendum)
Medication Instructions:  Your physician recommends that you continue on your current medications as directed. Please refer to the Current Medication list given to you today.  *If you need a refill on your cardiac medications before your next appointment, please call your pharmacy*   Lab Work: BMET in 3 months  If you have labs (blood work) drawn today and your tests are completely normal, you will receive your results only by: MyChart Message (if you have MyChart) OR A paper copy in the mail If you have any lab test that is abnormal or we need to change your treatment, we will call you to review the results.   Testing/Procedures: None   Follow-Up: At [facility], you and your health needs are our priority.  As part of our continuing mission to provide you with exceptional heart care, we have created designated Provider Care Teams.  These Care Teams include your primary Cardiologist (physician) and Advanced Practice Providers (APPs -  Physician Assistants and Nurse Practitioners) who all work together to provide you with the care you need, when you need it.  We recommend signing up for the patient portal called "MyChart".  Sign up information is provided on this After Visit Summary.  MyChart is used to connect with patients for Virtual Visits (Telemedicine).  Patients are able to view lab/test results, encounter notes, upcoming appointments, etc.  Non-urgent messages can be sent to your provider as well.   To learn more about what you can do with MyChart, go to [email or web address].    Your next appointment:   4 month(s)  The format for your next appointment:   In Person  Provider:   You will see one of the following Advanced Practice Providers on your designated Care Team:   Tereso Newcomer, PA-C Chelsea Aus, [place]   Other Instructions

## 2020-12-02 NOTE — Telephone Encounter (Signed)
Calling to get clairfication on the medication the patient is taking. Wanted to know if Dr. Excell Seltzer knows that patient PCP made changes to the medication furosemide (LASIX) 40 MG tablet and spironolactone (ALDACTONE) 25 MG tablet.Please advise

## 2020-12-02 NOTE — Progress Notes (Signed)
Cardiology Office Note:    Date:  12/05/2020   ID:  Karl Stevenson, DOB 05/15/1926, MRN [id number]  PCP:  Jarome Matin, MD   [facility] Providers Cardiologist:  [name], MD     Referring MD: Jarome Matin, MD   Chief Complaint  Patient presents with   Coronary Artery Disease    History of Present Illness:    Karl Stevenson is a 87 y.o. male with a hx of coronary artery disease with redo CABG in 2008.  The patient has chronic systolic heart failure with ischemic cardiomyopathy and LVEF 30 to 35%.  Comorbidities include peripheral arterial disease, hypertension, and carotid stenosis with history of left carotid endarterectomy.  He has been on mexiletine and Ranexa for ventricular tachycardia, managed by Dr. Graciela Husbands.  The patient is here with his son today.  He has had a tough time lately.  He had a mechanical fall several weeks ago and sustained a clavicular fracture and a laceration to his right elbow.  He has had osteomyelitis and is wearing soft braces around his feet to protect them.  The patient has been on IV antibiotics.  At some point he developed worsening shortness of breath and was found to have pulmonary edema and was started on diuretic therapy.  He has done much better on low-dose diuretic therapy and states that his breathing is back to baseline.  He has had no recent chest pain or pressure.  Past Medical History:  Diagnosis Date   AAA (abdominal aortic aneurysm) (HCC)    a. Ectatic abdominal aorta with fusiform dilitation 3.4x3.4 and moderate thrombus burden 12/2010   Bradycardia    a. previous intolerance to beta blockers - low dose toprol started 10/2011 for tachycardia but had rash and was discontinued;  b. tolerating low dose propranolol.   Carotid artery occlusion    a. s/p L CEA in 2010;  b. 06/2014 Carotid U/S: stable LICA CEA site w/o significant stenosis bilat.   Chronic systolic heart failure (HCC)    a. 02/2015 Echo:  35-40% (echo 11/2010); c.  02/2015 Echo: EF 20-25%, diff HK, mid-apicalanteroseptal and apical AK, Gr 1 DD, mild MR.   COPD (chronic obstructive pulmonary disease) (HCC)    Coronary artery disease    a. 1969 s/p MI;  b. S/P CABG 1987;  c. S/P redo 2008;  d. 07/2012 Cath: stable->Med Rx, EF 40%; e. 02/2014 MV: anterior and posterolateral/apical infarct with mild peri-infarct ischemia->Med Rx; f. 02/2015 Cath: LM 100d, LAD 100ost, LCX 100ost, OM2 100, VG->OM3->OM4 nl, VG-> LAD nl (Y from VG->OM), VG->OM2 100p.   Crohn's disease (HCC)    a. s/p colon resection   Gout    Hyperlipidemia    Hypertension    Ischemic cardiomyopathy    a. EF 34% (Lexiscan 06/2011); b. 35-40% (echo 11/2010); c. 02/2015 Echo: EF 20-25%, diff HK, mid-apicalanteroseptal and apical AK, Gr 1 DD, mild MR.   Peripheral vascular disease (HCC)    a. Carotid dz s/p L CEA, RAS, AAA, LE dz   RBBB    Renal artery stenosis (HCC)    a. Dopplers 12/2010 - 1-59% R ostial renal artery stenosis, 2 L renal arteries both widely patent   Syncope    Tachycardia    a. 10/2011 - atrial tach vs avnrt - BB started.   Ventricular tachycardia (HCC)    a. 02/2015 ->amio 200 daily added-->later switched to mexilitene.    Past Surgical History:  Procedure Laterality Date   ABDOMINAL AORTAGRAM  N/A 06/11/2011   Procedure: ABDOMINAL [name];  Surgeon: [name], MD;  Location: [facility] CATH LAB;  Service: Cardiovascular;  Laterality: N/A;   ABDOMINAL AORTOGRAM W/LOWER EXTREMITY N/A 09/05/2020   Procedure: ABDOMINAL AORTOGRAM W/LOWER EXTREMITY;  Surgeon: [name], MD;  Location: [facility] INVASIVE CV LAB;  Service: Cardiovascular;  Laterality: N/A;   CARDIAC CATHETERIZATION  07/28/2012   Native 3v CAD, continued patency of the SVG-OM1-LAD and SVG-OM2-left PDA. LVEF 40% with multiple WMAs   CARDIAC CATHETERIZATION N/A 03/08/2015   Procedure: Left Heart Cath and Coronary Angiography;  Surgeon: [name], MD;  Location: [facility] INVASIVE CV LAB;  Service: Cardiovascular;   Laterality: N/A;   CAROTID ENDARTERECTOMY Left 2010   CATARACT EXTRACTION, BILATERAL     COLON RESECTION  2008   COLON SURGERY     CORONARY ANGIOPLASTY     CORONARY ARTERY BYPASS GRAFT  10/31/85 & 08/19/06   ENDARTERECTOMY  08/26/2011   Procedure: ENDARTERECTOMY ILIAC;  Surgeon: [name], MD;  Location: [facility] OR;  Service: Vascular;  Laterality: Right;  Right Femoral Artery Endarterectomy with vascu guard patch angioplasty & intraoperative arteriogram.   EYE SURGERY     FEMORAL ENDARTERECTOMY Left  06/2011   ileofemoral endarterectomy with bovine patch angioplasty   IR FLUORO GUIDE CV LINE RIGHT  10/22/2020   IR US GUIDE VASC ACCESS RIGHT  10/22/2020   LEFT HEART CATHETERIZATION WITH CORONARY ANGIOGRAM N/A 07/28/2012   Procedure: LEFT HEART CATHETERIZATION WITH CORONARY ANGIOGRAM;  Surgeon: [name], MD;  Location: [facility] CATH LAB;  Service: Cardiovascular;  Laterality: N/A;   PERIPHERAL VASCULAR BALLOON ANGIOPLASTY Right 09/05/2020   Procedure: PERIPHERAL VASCULAR BALLOON ANGIOPLASTY;  Surgeon: [name], MD;  Location: [facility] INVASIVE CV LAB;  Service: Cardiovascular;  Laterality: Right;  Posterior Tibial   PR VEIN BYPASS GRAFT,AORTO-FEM-POP  10/1985   TONSILLECTOMY     as a child    Current Medications: Current Meds  Medication Sig   acetaminophen (TYLENOL) 500 MG tablet Take 1,000 mg by mouth every 6 (six) hours as needed (pain).    aspirin EC 81 MG tablet Take 81 mg by mouth daily.   atorvastatin (LIPITOR) 10 MG tablet Take 10 mg by mouth daily.   carboxymethylcellulose (REFRESH PLUS) 0.5 % SOLN Place 1 drop into both eyes 4 (four) times daily.   cefTRIAXone (ROCEPHIN) 10 g injection    cholecalciferol (VITAMIN D3) 25 MCG (1000 UNIT) tablet Take 1,000 Units by mouth daily.   clopidogrel (PLAVIX) 75 MG tablet TAKE 1 TABLET EVERY DAY   DAPTOmycin (CUBICIN) 500 MG injection Inject into the vein.   fexofenadine (ALLEGRA) 180 MG tablet Take 180 mg by mouth daily.   ipratropium  (ATROVENT) 0.03 % nasal spray Place 1 spray into both nostrils daily.   isosorbide mononitrate (IMDUR) 120 MG 24 hr tablet Take 120 mg by mouth daily.   isosorbide mononitrate (IMDUR) 60 MG 24 hr tablet Take 60 mg by mouth daily.   losartan (COZAAR) 25 MG tablet TAKE 1 TABLET TWICE DAILY   mexiletine (MEXITIL) 200 MG capsule TAKE 1 CAPSULE (200 MG TOTAL) BY MOUTH EVERY 12 HOURS.   Multiple Vitamin (MULTIVITAMIN WITH MINERALS) TABS tablet Take 2 tablets by mouth daily.   mupirocin ointment (BACTROBAN) 2 % Apply 1 application topically 2 (two) times daily. Apply to foot   nitroGLYCERIN (NITROSTAT) 0.4 MG SL tablet Place 1 tablet (0.4 mg total) under the tongue every 5 (five) minutes as needed for chest pain.   OVER THE  COUNTER MEDICATION Take 1 capsule by mouth daily. Nerve Health   pantoprazole (PROTONIX) 40 MG tablet TAKE 1 TABLET EVERY DAY   Polyethyl Glycol-Propyl Glycol (SYSTANE) 0.4-0.3 % SOLN Place 1 drop into both eyes in the morning and at bedtime.   polyethylene glycol powder (GLYCOLAX/MIRALAX) powder Take 17 g by mouth at bedtime. Mix in 8 oz liquid and drink   ranolazine (RANEXA) 500 MG 12 hr tablet TAKE 2 TABLETS TWICE DAILY   triamcinolone ointment (KENALOG) 0.1 % Apply 1 application topically 2 (two) times daily. Mix with Mupirocin and apply to head   vitamin B-12 (CYANOCOBALAMIN) 1000 MCG tablet Take 2,000 mcg by mouth daily.   [DISCONTINUED] furosemide (LASIX) 40 MG tablet Take 40 mg by mouth. Per patient taking 1/2 tablet   [DISCONTINUED] spironolactone (ALDACTONE) 25 MG tablet Take 12.5 mg by mouth daily.     Allergies:   Amoxicillin, Novocain [procaine hcl], Procaine, and Metoprolol   Social History   Socioeconomic History   Marital status: Married    Spouse name: Not on file   Number of children: Not on file   Years of education: Not on file   Highest education level: Not on file  Occupational History   Not on file  Tobacco Use   Smoking status: Never   Smokeless  tobacco: Never  Vaping Use   Vaping Use: Never used  Substance and Sexual Activity   Alcohol use: No   Drug use: No   Sexual activity: Not on file  Other Topics Concern   Not on file  Social History Narrative   Not on file   Social Determinants of Health   Financial Resource Strain: Not on file  Food Insecurity: Not on file  Transportation Needs: Not on file  Physical Activity: Not on file  Stress: Not on file  Social Connections: Not on file     Family History: The patient's family history includes CAD in his brother; COPD in his brother and mother; Cancer in his mother; Deep vein thrombosis in his brother; Heart disease in his father. There is no history of Anesthesia problems.  ROS:   Please see the history of present illness.    Positive for generalized weakness and gait instability.  Poor appetite.  All other systems reviewed and are negative.  EKGs/Labs/Other Studies Reviewed:    Recent Labs: 05/15/2020: TSH 4.360 11/26/2020: ALT 23; BUN 56; Creatinine, Ser 1.64; Hemoglobin 9.1; Platelets 215; Potassium 4.6; Sodium 134  Recent Lipid Panel    Component Value Date/Time   CHOL 116 01/25/2019 1430   TRIG 259 (H) 01/25/2019 1430   HDL 37 (L) 01/25/2019 1430   CHOLHDL 3.1 01/25/2019 1430   CHOLHDL 3.1 03/03/2016 1242   VLDL 35 (H) 03/03/2016 1242   LDLCALC 39 01/25/2019 1430     Risk Assessment/Calculations:           Physical Exam:    VS:  BP 120/60   Pulse 81   Ht 5\' 3"  (1.6 m)   Wt 129 lb 12.8 oz (58.9 kg)   SpO2 99%   BMI 22.99 kg/m     Wt Readings from Last 3 Encounters:  12/02/20 129 lb 12.8 oz (58.9 kg)  11/26/20 132 lb 4.4 oz (60 kg)  11/11/20 133 lb (60.3 kg)     GEN: Elderly male, frail appearing, in no acute distress HEENT: Normal NECK: No JVD; No carotid bruits LYMPHATICS: No lymphadenopathy CARDIAC: RRR, no murmurs, rubs, gallops RESPIRATORY:  Clear to auscultation without rales,  wheezing or rhonchi  ABDOMEN: Soft, non-tender,  non-distended MUSCULOSKELETAL: Both feet are in soft boots; No deformity  SKIN: Warm and dry NEUROLOGIC:  Alert and oriented x 3 PSYCHIATRIC:  Normal affect   ASSESSMENT:    1. Essential hypertension   2. Chronic systolic heart failure (HCC)   3. Coronary artery disease involving native coronary artery of native heart without angina pectoris    PLAN:    In order of problems listed above:  He brings in home blood pressure readings and these are reviewed today.  Blood pressures are in a good range.  He remains on isosorbide, losartan, and low doses of furosemide and spironolactone. Interstitial edema noted on his chest x-ray from November 11, 2020.  These findings are reviewed today.  He has been started on Monday Wednesday Friday furosemide and spironolactone and seems to have improved with no further shortness of breath.  Continue same management. Treated appropriately with antiplatelet therapy on aspirin and clopidogrel as well as a statin drug.        Medication Adjustments/Labs and Tests Ordered: Current medicines are reviewed at length with the patient today.  Concerns regarding medicines are outlined above.  Orders Placed This Encounter  Procedures   Basic metabolic panel   Meds ordered this encounter  Medications   DISCONTD: furosemide (LASIX) 40 MG tablet    Sig: Take 1 tablet (40 mg total) by mouth daily. Per patient taking 1/2 tablet    Dispense:  30 tablet    Refill:  11   DISCONTD: spironolactone (ALDACTONE) 25 MG tablet    Sig: Take 0.5 tablets (12.5 mg total) by mouth daily.    Dispense:  45 tablet    Refill:  3    Patient Instructions  Medication Instructions:  Your physician recommends that you continue on your current medications as directed. Please refer to the Current Medication list given to you today.  *If you need a refill on your cardiac medications before your next appointment, please call your pharmacy*   Lab Work: BMET in 3 months  If you have  labs (blood work) drawn today and your tests are completely normal, you will receive your results only by: MyChart Message (if you have MyChart) OR A paper copy in the mail If you have any lab test that is abnormal or we need to change your treatment, we will call you to review the results.   Testing/Procedures: None   Follow-Up: At [facility], you and your health needs are our priority.  As part of our continuing mission to provide you with exceptional heart care, we have created designated Provider Care Teams.  These Care Teams include your primary Cardiologist (physician) and Advanced Practice Providers (APPs -  Physician Assistants and Nurse Practitioners) who all work together to provide you with the care you need, when you need it.  We recommend signing up for the patient portal called "MyChart".  Sign up information is provided on this After Visit Summary.  MyChart is used to connect with patients for Virtual Visits (Telemedicine).  Patients are able to view lab/test results, encounter notes, upcoming appointments, etc.  Non-urgent messages can be sent to your provider as well.   To learn more about what you can do with MyChart, go to [email or web address].    Your next appointment:   4 month(s)  The format for your next appointment:   In Person  Provider:   You will see one of the following Advanced Practice Providers on your designated  Care Team:   Tereso Newcomer, PA-C [place] Aus, [place]   Other Instructions     Signed, [name], MD  12/05/2020 9:44 AM    [facility]

## 2020-12-03 ENCOUNTER — Encounter (HOSPITAL_BASED_OUTPATIENT_CLINIC_OR_DEPARTMENT_OTHER): Payer: Medicare PPO | Admitting: Internal Medicine

## 2020-12-03 DIAGNOSIS — I130 Hypertensive heart and chronic kidney disease with heart failure and stage 1 through stage 4 chronic kidney disease, or unspecified chronic kidney disease: Secondary | ICD-10-CM | POA: Diagnosis not present

## 2020-12-03 DIAGNOSIS — I252 Old myocardial infarction: Secondary | ICD-10-CM | POA: Diagnosis not present

## 2020-12-03 DIAGNOSIS — M86171 Other acute osteomyelitis, right ankle and foot: Secondary | ICD-10-CM | POA: Diagnosis not present

## 2020-12-03 DIAGNOSIS — I5022 Chronic systolic (congestive) heart failure: Secondary | ICD-10-CM | POA: Diagnosis not present

## 2020-12-03 DIAGNOSIS — I255 Ischemic cardiomyopathy: Secondary | ICD-10-CM | POA: Diagnosis not present

## 2020-12-03 DIAGNOSIS — N189 Chronic kidney disease, unspecified: Secondary | ICD-10-CM | POA: Diagnosis not present

## 2020-12-03 DIAGNOSIS — L97528 Non-pressure chronic ulcer of other part of left foot with other specified severity: Secondary | ICD-10-CM | POA: Diagnosis not present

## 2020-12-03 DIAGNOSIS — I70221 Atherosclerosis of native arteries of extremities with rest pain, right leg: Secondary | ICD-10-CM | POA: Diagnosis not present

## 2020-12-03 DIAGNOSIS — M869 Osteomyelitis, unspecified: Secondary | ICD-10-CM | POA: Diagnosis not present

## 2020-12-03 DIAGNOSIS — I2510 Atherosclerotic heart disease of native coronary artery without angina pectoris: Secondary | ICD-10-CM | POA: Diagnosis not present

## 2020-12-04 DIAGNOSIS — N189 Chronic kidney disease, unspecified: Secondary | ICD-10-CM | POA: Diagnosis not present

## 2020-12-04 DIAGNOSIS — M869 Osteomyelitis, unspecified: Secondary | ICD-10-CM | POA: Diagnosis not present

## 2020-12-04 DIAGNOSIS — I2510 Atherosclerotic heart disease of native coronary artery without angina pectoris: Secondary | ICD-10-CM | POA: Diagnosis not present

## 2020-12-04 DIAGNOSIS — I252 Old myocardial infarction: Secondary | ICD-10-CM | POA: Diagnosis not present

## 2020-12-04 DIAGNOSIS — I130 Hypertensive heart and chronic kidney disease with heart failure and stage 1 through stage 4 chronic kidney disease, or unspecified chronic kidney disease: Secondary | ICD-10-CM | POA: Diagnosis not present

## 2020-12-04 DIAGNOSIS — L97528 Non-pressure chronic ulcer of other part of left foot with other specified severity: Secondary | ICD-10-CM | POA: Diagnosis not present

## 2020-12-04 DIAGNOSIS — I5022 Chronic systolic (congestive) heart failure: Secondary | ICD-10-CM | POA: Diagnosis not present

## 2020-12-04 DIAGNOSIS — I70221 Atherosclerosis of native arteries of extremities with rest pain, right leg: Secondary | ICD-10-CM | POA: Diagnosis not present

## 2020-12-04 DIAGNOSIS — I255 Ischemic cardiomyopathy: Secondary | ICD-10-CM | POA: Diagnosis not present

## 2020-12-05 ENCOUNTER — Encounter: Payer: Self-pay | Admitting: Cardiovascular Disease

## 2020-12-06 ENCOUNTER — Other Ambulatory Visit: Payer: Self-pay

## 2020-12-06 ENCOUNTER — Ambulatory Visit: Payer: Medicare PPO | Admitting: Podiatry

## 2020-12-06 DIAGNOSIS — M86471 Chronic osteomyelitis with draining sinus, right ankle and foot: Secondary | ICD-10-CM | POA: Diagnosis not present

## 2020-12-06 DIAGNOSIS — L97521 Non-pressure chronic ulcer of other part of left foot limited to breakdown of skin: Secondary | ICD-10-CM | POA: Diagnosis not present

## 2020-12-06 DIAGNOSIS — L97516 Non-pressure chronic ulcer of other part of right foot with bone involvement without evidence of necrosis: Secondary | ICD-10-CM | POA: Diagnosis not present

## 2020-12-10 DIAGNOSIS — I130 Hypertensive heart and chronic kidney disease with heart failure and stage 1 through stage 4 chronic kidney disease, or unspecified chronic kidney disease: Secondary | ICD-10-CM | POA: Diagnosis not present

## 2020-12-10 DIAGNOSIS — I2510 Atherosclerotic heart disease of native coronary artery without angina pectoris: Secondary | ICD-10-CM | POA: Diagnosis not present

## 2020-12-10 DIAGNOSIS — I252 Old myocardial infarction: Secondary | ICD-10-CM | POA: Diagnosis not present

## 2020-12-10 DIAGNOSIS — I70221 Atherosclerosis of native arteries of extremities with rest pain, right leg: Secondary | ICD-10-CM | POA: Diagnosis not present

## 2020-12-10 DIAGNOSIS — I5022 Chronic systolic (congestive) heart failure: Secondary | ICD-10-CM | POA: Diagnosis not present

## 2020-12-10 DIAGNOSIS — N189 Chronic kidney disease, unspecified: Secondary | ICD-10-CM | POA: Diagnosis not present

## 2020-12-10 DIAGNOSIS — L97528 Non-pressure chronic ulcer of other part of left foot with other specified severity: Secondary | ICD-10-CM | POA: Diagnosis not present

## 2020-12-10 DIAGNOSIS — M869 Osteomyelitis, unspecified: Secondary | ICD-10-CM | POA: Diagnosis not present

## 2020-12-10 DIAGNOSIS — I255 Ischemic cardiomyopathy: Secondary | ICD-10-CM | POA: Diagnosis not present

## 2020-12-11 ENCOUNTER — Other Ambulatory Visit: Payer: Self-pay

## 2020-12-11 ENCOUNTER — Encounter: Payer: Self-pay | Admitting: Podiatry

## 2020-12-11 ENCOUNTER — Encounter (HOSPITAL_BASED_OUTPATIENT_CLINIC_OR_DEPARTMENT_OTHER): Payer: Medicare PPO | Attending: Physician Assistant | Admitting: Physician Assistant

## 2020-12-11 DIAGNOSIS — G6289 Other specified polyneuropathies: Secondary | ICD-10-CM | POA: Diagnosis not present

## 2020-12-11 DIAGNOSIS — L97519 Non-pressure chronic ulcer of other part of right foot with unspecified severity: Secondary | ICD-10-CM | POA: Diagnosis not present

## 2020-12-11 DIAGNOSIS — I5022 Chronic systolic (congestive) heart failure: Secondary | ICD-10-CM | POA: Insufficient documentation

## 2020-12-11 DIAGNOSIS — I25119 Atherosclerotic heart disease of native coronary artery with unspecified angina pectoris: Secondary | ICD-10-CM | POA: Diagnosis not present

## 2020-12-11 DIAGNOSIS — G9009 Other idiopathic peripheral autonomic neuropathy: Secondary | ICD-10-CM | POA: Diagnosis not present

## 2020-12-11 DIAGNOSIS — I110 Hypertensive heart disease with heart failure: Secondary | ICD-10-CM | POA: Insufficient documentation

## 2020-12-11 DIAGNOSIS — I739 Peripheral vascular disease, unspecified: Secondary | ICD-10-CM | POA: Diagnosis not present

## 2020-12-11 NOTE — Progress Notes (Signed)
Karl Stevenson, Karl Stevenson ([id number]) Visit Report for 12/11/2020 Arrival Information Details Patient Name: Date of Service: Karl Stevenson MES C. 12/11/2020 1:15 PM Medical Record Number: [id number] Patient Account Number: [id number] Date of Birth/Sex: Treating RN: 02/20/1927 (87 y.o. Karl Stevenson Primary Care Karl Stevenson: Karl Stevenson Other Clinician: Referring Karl Stevenson: Treating Matalynn Graff/Extender: Karle Barr in Treatment: 10 Visit Information History Since Last Visit Added or deleted any medications: No Patient Arrived: Karl Stevenson Any new allergies or adverse reactions: No Arrival Time: 13:23 Had a fall or experienced change in No Accompanied By: wife activities of daily living that may affect Transfer Assistance: None risk of falls: Patient Identification Verified: Yes Signs or symptoms of abuse/neglect since last visito No Secondary Verification Process Completed: Yes Hospitalized since last visit: No Patient Requires Transmission-Based Precautions: No Implantable device outside of the clinic excluding No Patient Has Alerts: Yes cellular tissue based products placed in the center Patient Alerts: Patient on Blood Thinner since last visit: ABI R=1.17 L=0.87 Has Dressing in Place as Prescribed: Yes TBI R=0.51 L=0.55 Pain Present Now: No Electronic Signature(s) Signed: 12/11/2020 5:27:26 PM By: Karl Stevenson Entered By: Karl Stevenson on 12/11/2020 13:29:05 -------------------------------------------------------------------------------- Encounter Discharge Information Details Patient Name: Date of Service: Karl Stevenson MES C. 12/11/2020 1:15 PM Medical Record Number: [id number] Patient Account Number: [id number] Date of Birth/Sex: Treating RN: 1927-04-10 (87 y.o. Karl Stevenson Primary Care Dezmin Kittelson: Karl Stevenson Other Clinician: Referring Kaleth Koy: Treating Karl Stevenson/Extender: Karle Barr in Treatment: 10 Encounter  Discharge Information Items Post Procedure Vitals Discharge Condition: Stable Temperature (F): 97.9 Ambulatory Status: Walker Pulse (bpm): 90 Discharge Destination: Home Respiratory Rate (breaths/min): 16 Transportation: Private Auto Blood Pressure (mmHg): 116/60 Accompanied By: self Schedule Follow-up Appointment: Yes Clinical Summary of Care: Electronic Signature(s) Signed: 12/11/2020 4:43:23 PM By: Karl Stevenson Entered By: Karl Stevenson on 12/11/2020 16:42:39 -------------------------------------------------------------------------------- Lower Extremity Assessment Details Patient Name: Date of Service: Karl Stevenson MES C. 12/11/2020 1:15 PM Medical Record Number: [id number] Patient Account Number: [id number] Date of Birth/Sex: Treating RN: [birth year] (87 y.o. Karl Stevenson Primary Care Ronson Hagins: Karl Stevenson Other Clinician: Referring Karl Stevenson: Treating Karl Stevenson/Extender: Karle Barr in Treatment: 10 Edema Assessment Assessed: [name]: No] Karl Stevenson: Yes] Edema: [Left: Ye] [Right: s] Calf Left: Right: Point of Measurement: 29 cm From Medial Instep 29.5 cm Ankle Left: Right: Point of Measurement: 8 cm From Medial Instep 19.5 cm Vascular Assessment Pulses: Dorsalis Pedis Palpable: [Right:Yes] Electronic Signature(s) Signed: 12/11/2020 5:27:26 PM By: Karl Stevenson Entered By: Karl Stevenson on 12/11/2020 13:36:30 -------------------------------------------------------------------------------- Multi-Disciplinary Care Plan Details Patient Name: Date of Service: Karl Stevenson MES C. 12/11/2020 1:15 PM Medical Record Number: [id number] Patient Account Number: [id number] Date of Birth/Sex: Treating RN: [birth year] (87 y.o. Karl Stevenson Primary Care Haidynn Almendarez: Karl Stevenson Other Clinician: Referring Kharee Lesesne: Treating Karl Stevenson/Extender: Karle Barr in Treatment: 10 Active Inactive Wound/Skin  Impairment Nursing Diagnoses: Knowledge deficit related to ulceration/compromised skin integrity Goals: Patient/caregiver will verbalize understanding of skin care regimen Date Initiated: 09/26/2020 Target Resolution Date: 01/08/2021 Goal Status: Active Interventions: Assess patient/caregiver ability to obtain necessary supplies Assess patient/caregiver ability to perform ulcer/skin care regimen upon admission and as needed Provide education on ulcer and skin care Treatment Activities: Skin care regimen initiated : 09/26/2020 Topical wound management initiated : 09/26/2020 Notes: Electronic Signature(s) Signed: 12/11/2020 5:57:23 PM By: Karl Gouty RN, BSN Entered By: Karl Stevenson on 12/11/2020 14:20:18 -------------------------------------------------------------------------------- Pain Assessment Details Patient Name: Date of Service: Karl ILIFF,  Karl MES C. 12/11/2020 1:15 PM Medical Record Number: [id number] Patient Account Number: [id number] Date of Birth/Sex: Treating RN: [birth year] (87 y.o. Karl Stevenson Primary Care Lariyah Shetterly: Karl Stevenson Other Clinician: Referring Alon Mazor: Treating Carnita Golob/Extender: Karle Barr in Treatment: 10 Active Problems Location of Pain Severity and Description of Pain Patient Has Paino No Site Locations Pain Management and Medication Current Pain Management: Electronic Signature(s) Signed: 12/11/2020 5:27:26 PM By: Karl Stevenson Entered By: Karl Stevenson on 12/11/2020 13:29:41 -------------------------------------------------------------------------------- Patient/Caregiver Education Details Patient Name: Date of Service: Karl Stevenson MES C. 8/3/2022andnbsp1:15 PM Medical Record Number: [id number] Patient Account Number: [id number] Date of Birth/Gender: Treating RN: [birth year] (87 y.o. Karl Stevenson Primary Care Physician: Karl Stevenson Other Clinician: Referring Physician: Treating  Physician/Extender: Karle Barr in Treatment: 10 Education Assessment Education Provided To: Patient Education Topics Provided Wound Debridement: Responses: Reinforcements needed, State content correctly Wound/Skin Impairment: Responses: Reinforcements needed, State content correctly Electronic Signature(s) Signed: 12/11/2020 5:57:23 PM By: Karl Gouty RN, BSN Entered By: Karl Stevenson on 12/11/2020 14:21:15 -------------------------------------------------------------------------------- Wound Assessment Details Patient Name: Date of Service: Karl Stevenson MES C. 12/11/2020 1:15 PM Medical Record Number: [id number] Patient Account Number: [id number] Date of Birth/Sex: Treating RN: 12-24-1926 (87 y.o. Karl Stevenson Primary Care Duvan Mousel: Karl Stevenson Other Clinician: Referring Savannaha Stonerock: Treating Tayton Decaire/Extender: Karle Barr in Treatment: 10 Wound Status Wound Number: 1 Primary Neuropathic Ulcer-Non Diabetic Etiology: Wound Location: Right, Medial Foot Wound Open Wounding Event: Gradually Appeared Status: Date Acquired: 07/23/2020 Comorbid Cataracts, Chronic Obstructive Pulmonary Disease (COPD), Weeks Of Treatment: 10 History: Arrhythmia, Congestive Heart Failure, Coronary Artery Disease, Clustered Wound: No Hypertension, Myocardial Infarction, Peripheral Venous Disease, Crohns Photos Wound Measurements Length: (cm) 0.4 Width: (cm) 0.4 Depth: (cm) 0.3 Area: (cm) 0.126 Volume: (cm) 0.038 % Reduction in Area: 89.3% % Reduction in Volume: 91.9% Epithelialization: Small (1-33%) Tunneling: No Undermining: No Wound Description Classification: Full Thickness Without Exposed Support Structures Wound Margin: Well defined, not attached Exudate Amount: Medium Exudate Type: Serosanguineous Exudate Color: red, brown Wound Bed Granulation Amount: Large (67-100%) Granulation Quality: Pink, Pale Necrotic  Amount: None Present (0%) Foul Odor After Cleansing: No Slough/Fibrino No Exposed Structure Fascia Exposed: No Fat Layer (Subcutaneous Tissue) Exposed: Yes Tendon Exposed: No Muscle Exposed: No Joint Exposed: No Bone Exposed: No Assessment Notes Calloused periwound Treatment Notes Wound #1 (Foot) Wound Laterality: Right, Medial Cleanser Soap and Water Discharge Instruction: May shower and wash wound with dial antibacterial soap and water prior to dressing change. [employer]ram Ancillary Kit - 15 Day Supply Discharge Instruction: Use supplies as instructed; Kit contains: (15) Saline Bullets; (15) 3x3 Gauze; 15 pr Gloves Peri-Wound Care Topical Primary Dressing Promogran Prisma Matrix, 4.34 (sq in) (silver collagen) Discharge Instruction: Moisten collagen with saline or hydrogel Secondary Dressing Woven Gauze Sponges 2x2 in Discharge Instruction: Apply over primary dressing as directed. Optifoam Non-Adhesive Dressing, 4x4 in Discharge Instruction: ***foam donut***Apply over primary dressing as directed. Secured With Conforming Stretch Gauze Bandage, Sterile 2x75 (in/in) Discharge Instruction: Secure with stretch gauze as directed. 66M Medipore H Soft Cloth Surgical T 4 x 2 (in/yd) ape Discharge Instruction: Secure dressing with tape as directed. Compression Wrap Compression Stockings Add-Ons Electronic Signature(s) Signed: 12/11/2020 5:27:26 PM By: Karl Stevenson Entered By: Karl Stevenson on 12/11/2020 13:35:21 -------------------------------------------------------------------------------- Vitals Details Patient Name: Date of Service: Karl Stevenson MES C. 12/11/2020 1:15 PM Medical Record Number: [id number] Patient Account Number: [id number] Date of Birth/Sex: Treating RN: 1926/06/17 (87 y.o. Karl Stevenson  Primary Care Angelus Hoopes: Karl Stevenson Other Clinician: Referring Laryssa Hassing: Treating Stevon Gough/Extender: Karle Barr in Treatment: 10 Vital  Signs Time Taken: 13:29 Temperature (F): 97.9 Height (in): 63 Pulse (bpm): 90 Weight (lbs): 136 Respiratory Rate (breaths/min): 16 Body Mass Index (BMI): 24.1 Blood Pressure (mmHg): 116/60 Reference Range: 80 - 120 mg / dl Electronic Signature(s) Signed: 12/11/2020 5:27:26 PM By: Karl Stevenson Entered By: Karl Stevenson on 12/11/2020 13:29:26

## 2020-12-11 NOTE — Progress Notes (Signed)
Subjective:  Patient ID: Karl Stevenson, male    DOB: [birth year],  MRN: [id number]  Chief Complaint  Patient presents with   Foot Pain    Bil foot wounds    87 y.o. male presents for wound care.  Patient presents with follow-up of right first metatarsophalangeal joint wound with underlying osteomyelitis.  It seems like the wound may be improving as well.  He is getting managed primarily at the wound care center.  He is doing with his IV antibiotics through PICC line.  He states he is doing better on the right side but now the left side wound has started to open up.  He would like to discuss treatment options for this.  He has not been to the wound care center since it opened up  He has been revascularized with right posterior tibial angioplasty and is the only flow to the right foot.   Review of Systems: Negative except as noted in the HPI. Denies N/V/F/Ch.  Past Medical History:  Diagnosis Date   AAA (abdominal aortic aneurysm) (HCC)    a. Ectatic abdominal aorta with fusiform dilitation 3.4x3.4 and moderate thrombus burden 12/2010   Bradycardia    a. previous intolerance to beta blockers - low dose toprol started 10/2011 for tachycardia but had rash and was discontinued;  b. tolerating low dose propranolol.   Carotid artery occlusion    a. s/p L CEA in 2010;  b. 06/2014 Carotid U/S: stable LICA CEA site w/o significant stenosis bilat.   Chronic systolic heart failure (HCC)    a. 02/2015 Echo:  35-40% (echo 11/2010); c. 02/2015 Echo: EF 20-25%, diff HK, mid-apicalanteroseptal and apical AK, Gr 1 DD, mild MR.   COPD (chronic obstructive pulmonary disease) (HCC)    Coronary artery disease    a. 1969 s/p MI;  b. S/P CABG 1987;  c. S/P redo 2008;  d. 07/2012 Cath: stable->Med Rx, EF 40%; e. 02/2014 MV: anterior and posterolateral/apical infarct with mild peri-infarct ischemia->Med Rx; f. 02/2015 Cath: LM 100d, LAD 100ost, LCX 100ost, OM2 100, VG->OM3->OM4 nl, VG-> LAD nl (Y from VG->OM),  VG->OM2 100p.   Crohn's disease (HCC)    a. s/p colon resection   Gout    Hyperlipidemia    Hypertension    Ischemic cardiomyopathy    a. EF 34% (Lexiscan 06/2011); b. 35-40% (echo 11/2010); c. 02/2015 Echo: EF 20-25%, diff HK, mid-apicalanteroseptal and apical AK, Gr 1 DD, mild MR.   Peripheral vascular disease (HCC)    a. Carotid dz s/p L CEA, RAS, AAA, LE dz   RBBB    Renal artery stenosis (HCC)    a. Dopplers 12/2010 - 1-59% R ostial renal artery stenosis, 2 L renal arteries both widely patent   Syncope    Tachycardia    a. 10/2011 - atrial tach vs avnrt - BB started.   Ventricular tachycardia (HCC)    a. 02/2015 ->amio 200 daily added-->later switched to mexilitene.    Current Outpatient Medications:    acetaminophen (TYLENOL) 500 MG tablet, Take 1,000 mg by mouth every 6 (six) hours as needed (pain). , Disp: , Rfl:    aspirin EC 81 MG tablet, Take 81 mg by mouth daily., Disp: , Rfl:    atorvastatin (LIPITOR) 10 MG tablet, Take 10 mg by mouth daily., Disp: , Rfl:    carboxymethylcellulose (REFRESH PLUS) 0.5 % SOLN, Place 1 drop into both eyes 4 (four) times daily., Disp: , Rfl:    cefTRIAXone (ROCEPHIN) 10 g injection, ,  Disp: , Rfl:    cholecalciferol (VITAMIN D3) 25 MCG (1000 UNIT) tablet, Take 1,000 Units by mouth daily., Disp: , Rfl:    clopidogrel (PLAVIX) 75 MG tablet, TAKE 1 TABLET EVERY DAY, Disp: 90 tablet, Rfl: 3   DAPTOmycin (CUBICIN) 500 MG injection, Inject into the vein., Disp: , Rfl:    fexofenadine (ALLEGRA) 180 MG tablet, Take 180 mg by mouth daily., Disp: , Rfl:    furosemide (LASIX) 40 MG tablet, Take 20 mg by mouth every Monday, Wednesday, and Friday., Disp: , Rfl:    ipratropium (ATROVENT) 0.03 % nasal spray, Place 1 spray into both nostrils daily., Disp: , Rfl:    isosorbide mononitrate (IMDUR) 120 MG 24 hr tablet, Take 120 mg by mouth daily., Disp: , Rfl:    isosorbide mononitrate (IMDUR) 60 MG 24 hr tablet, Take 60 mg by mouth daily., Disp: , Rfl:     losartan (COZAAR) 25 MG tablet, TAKE 1 TABLET TWICE DAILY, Disp: 180 tablet, Rfl: 2   mexiletine (MEXITIL) 200 MG capsule, TAKE 1 CAPSULE (200 MG TOTAL) BY MOUTH EVERY 12 HOURS., Disp: 180 capsule, Rfl: 1   Multiple Vitamin (MULTIVITAMIN WITH MINERALS) TABS tablet, Take 2 tablets by mouth daily., Disp: , Rfl:    mupirocin ointment (BACTROBAN) 2 %, Apply 1 application topically 2 (two) times daily. Apply to foot, Disp: , Rfl:    nitroGLYCERIN (NITROSTAT) 0.4 MG SL tablet, Place 1 tablet (0.4 mg total) under the tongue every 5 (five) minutes as needed for chest pain., Disp: 25 tablet, Rfl: 3   OVER THE COUNTER MEDICATION, Take 1 capsule by mouth daily. Nerve Health, Disp: , Rfl:    pantoprazole (PROTONIX) 40 MG tablet, TAKE 1 TABLET EVERY DAY, Disp: 90 tablet, Rfl: 3   Polyethyl Glycol-Propyl Glycol (SYSTANE) 0.4-0.3 % SOLN, Place 1 drop into both eyes in the morning and at bedtime., Disp: , Rfl:    polyethylene glycol powder (GLYCOLAX/MIRALAX) powder, Take 17 g by mouth at bedtime. Mix in 8 oz liquid and drink, Disp: , Rfl:    ranolazine (RANEXA) 500 MG 12 hr tablet, TAKE 2 TABLETS TWICE DAILY, Disp: 360 tablet, Rfl: 3   spironolactone (ALDACTONE) 25 MG tablet, Take 12.5 mg by mouth every Monday, Wednesday, and Friday., Disp: , Rfl:    triamcinolone ointment (KENALOG) 0.1 %, Apply 1 application topically 2 (two) times daily. Mix with Mupirocin and apply to head, Disp: , Rfl:    vitamin B-12 (CYANOCOBALAMIN) 1000 MCG tablet, Take 2,000 mcg by mouth daily., Disp: , Rfl:   Social History   Tobacco Use  Smoking Status Never  Smokeless Tobacco Never    Allergies  Allergen Reactions   Amoxicillin     diaharrea   Novocain [Procaine Hcl] Other (See Comments)    Cold sweats and very bad headache.    Procaine Other (See Comments)    Sweating headache   Metoprolol Hives, Itching and Rash   Objective:  There were no vitals filed for this visit. There is no height or weight on file to calculate  BMI. Constitutional Well developed. Well nourished.  Vascular Dorsalis pedis pulses palpable bilaterally. Posterior tibial pulses palpable bilaterally. Capillary refill normal to all digits.  No cyanosis or clubbing noted. Pedal hair growth normal.  Neurologic Normal speech. Oriented to person, place, and time. Protective sensation absent  Dermatologic Wound Location: Right first MPJ wound with underlying osteomyelitis.  Probes down to bone.  No cellulitis noted.  No purulent drainage expressed.  Bone is clinically  palpated.  No malodor present Wound Base: Mixed Granular/Fibrotic Peri-wound: Calloused Exudate: Scant/small amount Serous exudate Wound Measurements: -See below  Left first MPJ wound limited to the breakdown of the skin no purulent drainage noted.  No infection present.  No malodor present.  Orthopedic: No pain to palpation either foot.   Radiographs: MRI was reviewed  Ulcer IMPRESSION: 1. Ulceration and small sinus tract along the medial aspect of the first metatarsal head, extending to bone, with underlying osteomyelitis. No abscess. Assessment:   1. Chronic osteomyelitis of right foot with draining sinus (HCC)   2. Ulcer of right foot with bone involvement without evidence of necrosis (HCC)   3. Chronic foot ulcer, limited to breakdown of skin, left (HCC)      Plan:  Patient was evaluated and treated and all questions answered.  Right first metatarsophalangeal joint wound medial aspect with underlying osteomyelitis stable -At this time I do not have anything to offer the patient in terms of managing.  Continue services at the wound care center.  Unless if it becomes acutely infected patient would not benefit from a amputation as there is more risk of further amputation given the nature of the flow to his leg and his age.  If acutely infected patient may need urgent amputation versus a major amputation.  Patient states understanding Continue weightbearing as  tolerated in surgical shoe.  The rest of the wound care per wound care center  Left first MPJ wound limited to the breakdown of the skin -I discussed with the patient that he is starting to breakdown the left side as well.  I discussed with him to mention this at the wound care center to allow it to further managed the wound.  At this time I will cannot continue monitoring from afar and if it continues to regress and needs an amputation I will be happy to see him for surgical amputation.  No follow-ups on file.

## 2020-12-11 NOTE — Progress Notes (Addendum)
Karl Stevenson ([id number]) Visit Report for 12/11/2020 Chief Complaint Document Details Patient Name: Date of Service: Karl Stevenson MES C. 12/11/2020 1:15 PM Medical Record Number: [id number] Patient Account Number: [id number] Date of Birth/Sex: Treating RN: 1926-10-03 (87 y.o. Karl Stevenson Primary Care Provider: [name] Other Clinician: Referring Provider: Treating Provider/Extender: Karle Barr in Treatment: 10 Information Obtained from: Patient Chief Complaint Right foot wound Electronic Signature(s) Signed: 12/11/2020 1:40:54 PM By: Worthy Keeler PA-C Entered By: Worthy Keeler on 12/11/2020 13:40:54 -------------------------------------------------------------------------------- Debridement Details Patient Name: Date of Service: Karl Stevenson, Karl Stevenson MES C. 12/11/2020 1:15 PM Medical Record Number: [id number] Patient Account Number: [id number] Date of Birth/Sex: Treating RN: 07-24-1926 (87 y.o. Karl Stevenson Primary Care Provider: [name] Other Clinician: Referring Provider: Treating Provider/Extender: Karle Barr in Treatment: 10 Debridement Performed for Assessment: Wound #1 Right,Medial Foot Performed By: Physician Worthy Keeler, PA Debridement Type: Debridement Level of Consciousness (Pre-procedure): Awake and Alert Pre-procedure Verification/Time Out Yes - 14:20 Taken: Start Time: 14:21 Pain Control: Other : benzocaine 20% spray T Area Debrided (L x W): otal 0.7 (cm) x 0.7 (cm) = 0.49 (cm) Tissue and other material debrided: Viable, Non-Viable, Callus, Slough, Subcutaneous, Skin: Epidermis, Slough Level: Skin/Subcutaneous Tissue Debridement Description: Excisional Instrument: Curette Bleeding: Minimum Hemostasis Achieved: Pressure End Time: 14:25 Procedural Pain: 0 Post Procedural Pain: 0 Response to Treatment: Procedure was tolerated well Level of Consciousness (Post- Awake and  Alert procedure): Post Debridement Measurements of Total Wound Length: (cm) 0.5 Width: (cm) 0.4 Depth: (cm) 0.2 Volume: (cm) 0.031 Character of Wound/Ulcer Post Debridement: Improved Post Procedure Diagnosis Same as Pre-procedure Electronic Signature(s) Signed: 12/11/2020 5:22:07 PM By: Worthy Keeler PA-C Signed: 12/11/2020 5:57:23 PM By: Baruch Gouty RN, BSN Entered By: Baruch Gouty on 12/11/2020 14:25:04 -------------------------------------------------------------------------------- HPI Details Patient Name: Date of Service: Karl Stevenson, Karl Stevenson MES C. 12/11/2020 1:15 PM Medical Record Number: [id number] Patient Account Number: [id number] Date of Birth/Sex: Treating RN: 06/05/1926 (87 y.o. Karl Stevenson Primary Care Provider: [name] Other Clinician: Referring Provider: Treating Provider/Extender: Karle Barr in Treatment: 65 History of Present Illness HPI Description: Karl Stevenson is a 87 year old male with a past medical history of peripheral vascular disease, coronary artery disease, chronic systolic heart failure, and peripheral neuropathy that presents to our clinic for a 68-monthhistory of right foot wound. He thinks that the wound started by Rubbing against his shoes. He states he was evaluated by his primary care physician who obtained an x-ray of the right foot. They were sent to vein and vascular for further assessment of the blood flow to the lower extremities. He had an aortogram on 09/05/2020 and had a right posterior tibial artery angioplasty. He states that the wound has been stable in appearance and size since the procedure. He has been using Bactroban and covering the area with a Band-Aid. He is using soft bedroom shoes. He denies acute signs of infection. 5/26; patient presents for 1 week follow-up. He has been using collagen to the wound with dressing changes. He has no complaints and denies acute signs of infection today.  He is scheduled for an MRI on 6/2 6/6; patient presents for 1 week follow-up. He has been using collagen to the wound. He has noticed some increased redness to the wound site. He obtained his MRI. 6/13; this is a patient with a wound on the medial aspect of his right first metatarsal head. He had  an MRI on 6/2 that suggested a wound along the medial aspect the first metatarsal head extending to bone with underlying osteomyelitis. Bone culture that was done did not show specific pathogens. The biopsies suggested chronic active inflammation and necrosis without malignancy identified presumably osteomyelitis. The patient saw Dr. West Bali on 6/8. He is to started on daptomycin and ceftriaxone he is getting a PICC line tomorrow. He has been on doxycycline and Augmentin but apparently stopped these because of diarrhea. [facility] also [facility] for [facility] opinions. He is using silver collagen on the wound. The patient is already been revascularized in late April with a right posterior tibial angioplasty 6/20; patient presents for 1 week follow-up. He has been on IV antibiotics for osteomyelitis for the past 1 to 2 weeks. He denies any issues with the infusions. He overall feels okay 12/11/2020 upon evaluation today patient's wound actually is showing signs of doing decently well in regard to the foot ulcer. I am actually very pleased with where things stand today. There is no signs of active infection at this time which is great news. No fevers, chills, nausea, vomiting, or diarrhea. Electronic Signature(s) Signed: 12/11/2020 2:40:29 PM By: Worthy Keeler PA-C Entered By: Worthy Keeler on 12/11/2020 14:40:28 -------------------------------------------------------------------------------- Physical Exam Details Patient Name: Date of Service: Karl Stevenson, Karl Stevenson MES C. 12/11/2020 1:15 PM Medical Record Number: [id number] Patient Account Number: [id number] Date of Birth/Sex: Treating  RN: 1927/04/19 (87 y.o. Karl Stevenson Primary Care Provider: [name] Other Clinician: Referring Provider: Treating Provider/Extender: Karle Barr in Treatment: 28 Constitutional Well-nourished and well-hydrated in no acute distress. Respiratory normal breathing without difficulty. Psychiatric this patient is able to make decisions and demonstrates good insight into disease process. Alert and Oriented x 3. pleasant and cooperative. Notes Upon inspection patient's wound again showed signs of doing well with no evidence obviously of infection which is great news and overall I am extremely pleased with where things stand today. There is no signs of active infection which is great news and overall I am extremely happy in that regard as well. Electronic Signature(s) Signed: 12/11/2020 2:40:52 PM By: Worthy Keeler PA-C Entered By: Worthy Keeler on 12/11/2020 14:40:52 -------------------------------------------------------------------------------- Physician Orders Details Patient Name: Date of Service: Karl Stevenson, [place] MES C. 12/11/2020 1:15 PM Medical Record Number: [id number] Patient Account Number: [id number] Date of Birth/Sex: Treating RN: 11-24-1926 (87 y.o. Karl Stevenson Primary Care Provider: [name] Other Clinician: Referring Provider: Treating Provider/Extender: Karle Barr in Treatment: 10 Verbal / Phone Orders: No Diagnosis Coding ICD-10 Coding Code Description L97.519 Non-pressure chronic ulcer of other part of right foot with unspecified severity I73.9 Peripheral vascular disease, unspecified G90.09 Other idiopathic peripheral autonomic neuropathy A16.55 Chronic systolic (congestive) heart failure I25.119 Atherosclerotic heart disease of native coronary artery with unspecified angina pectoris M86.171 Other acute osteomyelitis, right ankle and foot Follow-up Appointments ppointment in 2 weeks. -  Dr. Heber [place] Return A Bathing/ Shower/ Hygiene May shower and wash wound with soap and water. - with dressing changes only. Edema Control - Lymphedema / SCD / Other Elevate legs to the level of the heart or above for 30 minutes daily and/or when sitting, a frequency of: - 3-4 times throughout the day. Avoid standing for long periods of time. Moisturize legs daily. - every night before bed. Off-Loading Open toe surgical shoe to: - both feet wear while walking. May wear regular shoes or sandals as long as  the shoe does not rub on the foot Wound Treatment Wound #1 - Foot Wound Laterality: Right, Medial Cleanser: Soap and Water Every Other Day/15 Days Discharge Instructions: May shower and wash wound with dial antibacterial soap and water prior to dressing change. Cleanser: Byram Ancillary Kit - 15 Day Supply (Generic) Every Other Day/15 Days Discharge Instructions: Use supplies as instructed; Kit contains: (15) Saline Bullets; (15) 3x3 Gauze; 15 pr Gloves Prim Dressing: Promogran Prisma Matrix, 4.34 (sq in) (silver collagen) Every Other Day/15 Days ary Discharge Instructions: Moisten collagen with saline or hydrogel Secondary Dressing: Woven Gauze Sponges 2x2 in (Generic) Every Other Day/15 Days Discharge Instructions: Apply over primary dressing as directed. Secondary Dressing: Optifoam Non-Adhesive Dressing, 4x4 in (Generic) Every Other Day/15 Days Discharge Instructions: ***foam donut***Apply over primary dressing as directed. Secured With: Child psychotherapist, Sterile 2x75 (in/in) (Generic) Every Other Day/15 Days Discharge Instructions: Secure with stretch gauze as directed. Secured With: 68M Medipore H Soft Cloth Surgical T 4 x 2 (in/yd) (Generic) Every Other Day/15 Days ape Discharge Instructions: Secure dressing with tape as directed. Electronic Signature(s) Signed: 12/11/2020 5:22:07 PM By: Worthy Keeler PA-C Signed: 12/11/2020 5:57:23 PM By: Baruch Gouty RN,  BSN Entered By: Baruch Gouty on 12/11/2020 14:28:45 -------------------------------------------------------------------------------- Problem List Details Patient Name: Date of Service: Karl Stevenson, [place] MES C. 12/11/2020 1:15 PM Medical Record Number: [id number] Patient Account Number: [id number] Date of Birth/Sex: Treating RN: 1926-10-13 (87 y.o. Karl Stevenson Primary Care Provider: [name] Other Clinician: Referring Provider: Treating Provider/Extender: Karle Barr in Treatment: 10 Active Problems ICD-10 Encounter Code Description Active Date MDM Diagnosis L97.519 Non-pressure chronic ulcer of other part of right foot with unspecified severity 09/26/2020 No Yes I73.9 Peripheral vascular disease, unspecified 09/26/2020 No Yes G90.09 Other idiopathic peripheral autonomic neuropathy 09/26/2020 No Yes D32.20 Chronic systolic (congestive) heart failure 09/26/2020 No Yes I25.119 Atherosclerotic heart disease of native coronary artery with unspecified angina 09/26/2020 No Yes pectoris M86.171 Other acute osteomyelitis, right ankle and foot 10/14/2020 No Yes Inactive Problems Resolved Problems Electronic Signature(s) Signed: 12/11/2020 1:40:45 PM By: Worthy Keeler PA-C Entered By: Worthy Keeler on 12/11/2020 13:40:45 -------------------------------------------------------------------------------- Progress Note Details Patient Name: Date of Service: Karl Stevenson, Karl Stevenson MES C. 12/11/2020 1:15 PM Medical Record Number: [id number] Patient Account Number: [id number] Date of Birth/Sex: Treating RN: January 21, 1927 (87 y.o. Karl Stevenson Primary Care Provider: [name] Other Clinician: Referring Provider: Treating Provider/Extender: Karle Barr in Treatment: 10 Subjective Chief Complaint Information obtained from Patient Right foot wound History of Present Illness (HPI) Mr. Damyn Weitzel is a 87 year old male with a past  medical history of peripheral vascular disease, coronary artery disease, chronic systolic heart failure, and peripheral neuropathy that presents to our clinic for a 86-monthhistory of right foot wound. He thinks that the wound started by Rubbing against his shoes. He states he was evaluated by his primary care physician who obtained an x-ray of the right foot. They were sent to vein and vascular for further assessment of the blood flow to the lower extremities. He had an aortogram on 09/05/2020 and had a right posterior tibial artery angioplasty. He states that the wound has been stable in appearance and size since the procedure. He has been using Bactroban and covering the area with a Band-Aid. He is using soft bedroom shoes. He denies acute signs of infection. 5/26; patient presents for 1 week follow-up. He has been using collagen to the wound with dressing changes.  He has no complaints and denies acute signs of infection today. He is scheduled for an MRI on 6/2 6/6; patient presents for 1 week follow-up. He has been using collagen to the wound. He has noticed some increased redness to the wound site. He obtained his MRI. 6/13; this is a patient with a wound on the medial aspect of his right first metatarsal head. He had an MRI on 6/2 that suggested a wound along the medial aspect the first metatarsal head extending to bone with underlying osteomyelitis. Bone culture that was done did not show specific pathogens. The biopsies suggested chronic active inflammation and necrosis without malignancy identified presumably osteomyelitis. The patient saw Dr. West Bali on 6/8. He is to started on daptomycin and ceftriaxone he is getting a PICC line tomorrow. He has been on doxycycline and Augmentin but apparently stopped these because of diarrhea. [facility] for [facility] opinions. He is using silver collagen on the wound. The patient is already been revascularized in late  April with a right posterior tibial angioplasty 6/20; patient presents for 1 week follow-up. He has been on IV antibiotics for osteomyelitis for the past 1 to 2 weeks. He denies any issues with the infusions. He overall feels okay 12/11/2020 upon evaluation today patient's wound actually is showing signs of doing decently well in regard to the foot ulcer. I am actually very pleased with where things stand today. There is no signs of active infection at this time which is great news. No fevers, chills, nausea, vomiting, or diarrhea. Objective Constitutional Well-nourished and well-hydrated in no acute distress. Vitals Time Taken: 1:29 PM, Height: 63 in, Weight: 136 lbs, BMI: 24.1, Temperature: 97.9 F, Pulse: 90 bpm, Respiratory Rate: 16 breaths/min, Blood Pressure: 116/60 mmHg. Respiratory normal breathing without difficulty. Psychiatric this patient is able to make decisions and demonstrates good insight into disease process. Alert and Oriented x 3. pleasant and cooperative. General Notes: Upon inspection patient's wound again showed signs of doing well with no evidence obviously of infection which is great news and overall I am extremely pleased with where things stand today. There is no signs of active infection which is great news and overall I am extremely happy in that regard as well. Integumentary (Hair, Skin) Wound #1 status is Open. Original cause of wound was Gradually Appeared. The date acquired was: 07/23/2020. The wound has been in treatment 10 weeks. The wound is located on the Right,Medial Foot. The wound measures 0.4cm length x 0.4cm width x 0.3cm depth; 0.126cm^2 area and 0.038cm^3 volume. There is Fat Layer (Subcutaneous Tissue) exposed. There is no tunneling or undermining noted. There is a medium amount of serosanguineous drainage noted. The wound margin is well defined and not attached to the wound base. There is large (67-100%) pink, pale granulation within the wound bed.  There is no necrotic tissue within the wound bed. General Notes: Calloused periwound Assessment Active Problems ICD-10 Non-pressure chronic ulcer of other part of right foot with unspecified severity Peripheral vascular disease, unspecified Other idiopathic peripheral autonomic neuropathy Chronic systolic (congestive) heart failure Atherosclerotic heart disease of native coronary artery with unspecified angina pectoris Other acute osteomyelitis, right ankle and foot Procedures Wound #1 Pre-procedure diagnosis of Wound #1 is a Neuropathic Ulcer-Non Diabetic located on the Right,Medial Foot . There was a Excisional Skin/Subcutaneous Tissue Debridement with a total area of 0.49 sq cm performed by Worthy Keeler, PA. With the following instrument(s): Curette to remove Viable and Non-Viable tissue/material. Material  removed includes Callus, Subcutaneous Tissue, Slough, and Skin: Epidermis after achieving pain control using Other (benzocaine 20% spray). No specimens were taken. A time out was conducted at 14:20, prior to the start of the procedure. A Minimum amount of bleeding was controlled with Pressure. The procedure was tolerated well with a pain level of 0 throughout and a pain level of 0 following the procedure. Post Debridement Measurements: 0.5cm length x 0.4cm width x 0.2cm depth; 0.031cm^3 volume. Character of Wound/Ulcer Post Debridement is improved. Post procedure Diagnosis Wound #1: Same as Pre-Procedure Plan Follow-up Appointments: Return Appointment in 2 weeks. - Dr. Heber [place] Bathing/ Shower/ Hygiene: May shower and wash wound with soap and water. - with dressing changes only. Edema Control - Lymphedema / SCD / Other: Elevate legs to the level of the heart or above for 30 minutes daily and/or when sitting, a frequency of: - 3-4 times throughout the day. Avoid standing for long periods of time. Moisturize legs daily. - every night before bed. Off-Loading: Open toe surgical  shoe to: - both feet wear while walking. May wear regular shoes or sandals as long as the shoe does not rub on the foot WOUND #1: - Foot Wound Laterality: Right, Medial Cleanser: Soap and Water Every Other Day/15 Days Discharge Instructions: May shower and wash wound with dial antibacterial soap and water prior to dressing change. Cleanser: Byram Ancillary Kit - 15 Day Supply (Generic) Every Other Day/15 Days Discharge Instructions: Use supplies as instructed; Kit contains: (15) Saline Bullets; (15) 3x3 Gauze; 15 pr Gloves Prim Dressing: Promogran Prisma Matrix, 4.34 (sq in) (silver collagen) Every Other Day/15 Days ary Discharge Instructions: Moisten collagen with saline or hydrogel Secondary Dressing: Woven Gauze Sponges 2x2 in (Generic) Every Other Day/15 Days Discharge Instructions: Apply over primary dressing as directed. Secondary Dressing: Optifoam Non-Adhesive Dressing, 4x4 in (Generic) Every Other Day/15 Days Discharge Instructions: ***foam donut***Apply over primary dressing as directed. Secured With: Child psychotherapist, Sterile 2x75 (in/in) (Generic) Every Other Day/15 Days Discharge Instructions: Secure with stretch gauze as directed. Secured With: 26M Medipore H Soft Cloth Surgical T 4 x 2 (in/yd) (Generic) Every Other Day/15 Days ape Discharge Instructions: Secure dressing with tape as directed. 1. Would recommend that we going continue with the wound care measures as before although I will switch from silver alginate to silver collagen the wound is looking so good I think a collagen will help to promote new granular and epithelial growth. 2. I am also can recommend that we have the patient continue to as much as possible elevate his legs to try to help with any edema control though do not see a lot of edema or swelling at this point which is great news. 3. I also recommend that he get shoes that do not rub on the medial or lateral aspects of his feet both showed  signs of callus at all locations we want to try to keep this under control. We will see patient back for reevaluation in 2 weeks here in the clinic. If anything worsens or changes patient will contact our office for additional recommendations. Electronic Signature(s) Signed: 12/11/2020 2:41:29 PM By: Worthy Keeler PA-C Entered By: Worthy Keeler on 12/11/2020 14:41:29 -------------------------------------------------------------------------------- SuperBill Details Patient Name: Date of Service: Karl Stevenson, [place] MES C. 12/11/2020 Medical Record Number: [id number] Patient Account Number: [id number] Date of Birth/Sex: Treating RN: [birth year] (87 y.o. Karl Stevenson Primary Care Provider: [name] Other Clinician: Referring Provider: Treating Provider/Extender: Wendi Snipes, [place] Maxin  in Treatment: 10 Diagnosis Coding ICD-10 Codes Code Description L97.519 Non-pressure chronic ulcer of other part of right foot with unspecified severity I73.9 Peripheral vascular disease, unspecified G90.09 Other idiopathic peripheral autonomic neuropathy N23.55 Chronic systolic (congestive) heart failure I25.119 Atherosclerotic heart disease of native coronary artery with unspecified angina pectoris M86.171 Other acute osteomyelitis, right ankle and foot Facility Procedures CPT4 Code: [id number] Description: 27062 - DEB SUBQ TISSUE 20 SQ CM/< ICD-10 Diagnosis Description L97.519 Non-pressure chronic ulcer of other part of right foot with unspecified severi Modifier: ty Quantity: 1 Physician Procedures : CPT4 Code Description Modifier [id number] - WC PHYS SUBQ TISS 20 SQ CM ICD-10 Diagnosis Description L97.519 Non-pressure chronic ulcer of other part of right foot with unspecified severity Quantity: 1 Electronic Signature(s) Signed: 12/11/2020 2:41:37 PM By: Worthy Keeler PA-C Entered By: Worthy Keeler on 12/11/2020 14:41:37

## 2020-12-13 ENCOUNTER — Ambulatory Visit (INDEPENDENT_AMBULATORY_CARE_PROVIDER_SITE_OTHER): Payer: Medicare PPO

## 2020-12-13 ENCOUNTER — Other Ambulatory Visit: Payer: Self-pay

## 2020-12-13 ENCOUNTER — Encounter: Payer: Self-pay | Admitting: Physician Assistant

## 2020-12-13 ENCOUNTER — Ambulatory Visit: Payer: Medicare PPO | Admitting: Physician Assistant

## 2020-12-13 DIAGNOSIS — S42011D Anterior displaced fracture of sternal end of right clavicle, subsequent encounter for fracture with routine healing: Secondary | ICD-10-CM | POA: Diagnosis not present

## 2020-12-13 NOTE — Progress Notes (Signed)
Office Visit Note   Patient: Karl Stevenson           Date of Birth: Dec 23, 1926           MRN: [id number] Visit Date: 12/13/2020              Requested by: Camelia Phenes, DO [address] [place],  [place] [address] PCP: Jarome Matin, MD   Assessment & Plan: Visit Diagnoses:  1. Closed anterior displaced fracture of sternal end of right clavicle with routine healing, subsequent encounter     Plan: This point time patient is not interested in any type of surgical intervention.  He states he is having no significant pain in the chest or shoulder on the right side.  He is very low demand.  He also has significant cardiac history.  He can DC the sling Holoman gradient for comfort.  He states he does no heavy lifting activities.  I explained to him that he will have a permanent deformity there at the fracture site.  He is okay with this deformity.  Like to see him back in just 1 month to see how he is doing overall.  Questions encouraged and answered at length.   Follow-Up Instructions: Return in about 4 weeks (around 01/10/2021) for Radiographs.   Orders:  Orders Placed This Encounter  Procedures   XR Clavicle Right   XR Shoulder 1V Right   No orders of the defined types were placed in this encounter.     Procedures: No procedures performed   Clinical Data: No additional findings.   Subjective: Chief Complaint  Patient presents with   Right Shoulder - Pain    HPI Karl Stevenson is a pleasant 87 year old male who comes in today due to a right clavicle fracture he sustained due to a fall on 11/11/2020.  He states is not having much pain.  Is taken Tylenol has been compliant with his sling and swath.  He states he is wanting to remove the sling.  He has had no chest pain or shortness of breath.  He is right-handed but states he is very low demand there is no real lifting or anything with his arms. Review of Systems See HPI.  Objective: Vital Signs: There  were no vitals taken for this visit.  Physical Exam General well-developed well-nourished male in no acute distress. Psych: Alert and oriented x3 Ortho Exam Right shoulder gentle range of motion reveals the shoulder to be well located.  Obvious deformity at the medial aspect of the right clavicle with prominence.  There is no tenting of the skin or or skin breakdown.  No significant motion with palpation over this area and no significant tenderness with palpation over the medial aspect of the right clavicle. Specialty Comments:  No specialty comments available.  Imaging: Right clavicle 2 views: Medial aspect of the clavicle with fracture fragment with displacement from inferior to the proximal.  No significant consolidation.  No other fractures identified. Right shoulder right shoulder single AP view: Fracture medial clavicle.  Shoulders well located.  No other acute fractures or findings.  Calcific changes along the greater tuberosity of the humeral head noted.  PMFS History: Patient Active Problem List   Diagnosis Date Noted   Osteomyelitis (HCC) 10/16/2020   Medication monitoring encounter 10/16/2020   Bilateral impacted cerumen 07/25/2019   Mixed conductive and sensorineural hearing loss of right ear with restricted hearing of left ear 04/27/2016   Otorrhea, right 04/27/2016  Perforation of right tympanic membrane 04/27/2016   Orthostatic hypotension 10/25/2015   Dehydration 10/24/2015   Nausea and vomiting 10/24/2015   Diarrhea 10/24/2015   HLD (hyperlipidemia) 08/15/2015   Midsternal chest pain 07/28/2015   Syncope 04/27/2015   Ventricular tachycardia (HCC) 03/07/2015   NSVT (nonsustained ventricular tachycardia) (HCC) 03/05/2015   Unstable angina pectoris (HCC) 02/12/2014   Abdominal pain, unspecified site 02/14/2013    Class: Acute   SBO (small bowel obstruction) (HCC) 02/10/2013   Ankle edema 09/02/2012   Chronic venous insufficiency 09/02/2012   Unstable angina (HCC)  07/28/2012   CKD (chronic kidney disease), stage III (HCC) 07/28/2012   Atherosclerosis of native arteries of the extremities, unspecified 10/30/2011   Tachycardia 10/18/2011   Bradycardia 10/18/2011   RBBB 10/17/2011   Chest pain with moderate risk of acute coronary syndrome 10/17/2011   Sinus tachycardia 10/17/2011   Aftercare following surgery of the circulatory system, NEC 09/11/2011   Bilateral femoral artery stenosis (HCC) 09/11/2011   Hypotension 08/27/2011   Peripheral vascular disease, unspecified (HCC) 07/24/2011   Ischemic cardiomyopathy 06/16/2011   Chronic systolic heart failure (HCC)    Occlusion and stenosis of carotid artery without mention of cerebral infarction 06/05/2011   Pain, limb, left 06/05/2011   Intermittent claudication (HCC) 06/05/2011   Carotid stenosis 06/05/2011   Atherosclerosis of renal artery (HCC) 01/06/2011   Hx of CABG '87, '08 11/21/2010   HTN (hypertension) 11/21/2010   PVD (peripheral vascular disease) (HCC) 11/21/2010   Crohn's disease (HCC)    Anemia    Anemia    Past Medical History:  Diagnosis Date   AAA (abdominal aortic aneurysm) (HCC)    a. Ectatic abdominal aorta with fusiform dilitation 3.4x3.4 and moderate thrombus burden 12/2010   Bradycardia    a. previous intolerance to beta blockers - low dose toprol started 10/2011 for tachycardia but had rash and was discontinued;  b. tolerating low dose propranolol.   Carotid artery occlusion    a. s/p L CEA in 2010;  b. 06/2014 Carotid U/S: stable LICA CEA site w/o significant stenosis bilat.   Chronic systolic heart failure (HCC)    a. 02/2015 Echo:  35-40% (echo 11/2010); c. 02/2015 Echo: EF 20-25%, diff HK, mid-apicalanteroseptal and apical AK, Gr 1 DD, mild MR.   COPD (chronic obstructive pulmonary disease) (HCC)    Coronary artery disease    a. 1969 s/p MI;  b. S/P CABG 1987;  c. S/P redo 2008;  d. 07/2012 Cath: stable->Med Rx, EF 40%; e. 02/2014 MV: anterior and posterolateral/apical  infarct with mild peri-infarct ischemia->Med Rx; f. 02/2015 Cath: LM 100d, LAD 100ost, LCX 100ost, OM2 100, VG->OM3->OM4 nl, VG-> LAD nl (Y from VG->OM), VG->OM2 100p.   Crohn's disease (HCC)    a. s/p colon resection   Gout    Hyperlipidemia    Hypertension    Ischemic cardiomyopathy    a. EF 34% (Lexiscan 06/2011); b. 35-40% (echo 11/2010); c. 02/2015 Echo: EF 20-25%, diff HK, mid-apicalanteroseptal and apical AK, Gr 1 DD, mild MR.   Peripheral vascular disease (HCC)    a. Carotid dz s/p L CEA, RAS, AAA, LE dz   RBBB    Renal artery stenosis (HCC)    a. Dopplers 12/2010 - 1-59% R ostial renal artery stenosis, 2 L renal arteries both widely patent   Syncope    Tachycardia    a. 10/2011 - atrial tach vs avnrt - BB started.   Ventricular tachycardia (HCC)    a. 02/2015 ->amio 200  daily added-->later switched to mexilitene.    Family History  Problem Relation Age of Onset   Heart disease Father    Cancer Mother    COPD Mother    Deep vein thrombosis Brother    COPD Brother    CAD Brother    Anesthesia problems Neg Hx     Past Surgical History:  Procedure Laterality Date   ABDOMINAL AORTAGRAM N/A 06/11/2011   Procedure: ABDOMINAL AORTAGRAM;  Surgeon: [name], MD;  Location: [facility] CATH LAB;  Service: Cardiovascular;  Laterality: N/A;   ABDOMINAL AORTOGRAM W/LOWER EXTREMITY N/A 09/05/2020   Procedure: ABDOMINAL AORTOGRAM W/LOWER EXTREMITY;  Surgeon: [name], MD;  Location: [facility] INVASIVE CV LAB;  Service: Cardiovascular;  Laterality: N/A;   CARDIAC CATHETERIZATION  07/28/2012   Native 3v CAD, continued patency of the SVG-OM1-LAD and SVG-OM2-left PDA. LVEF 40% with multiple WMAs   CARDIAC CATHETERIZATION N/A 03/08/2015   Procedure: Left Heart Cath and Coronary Angiography;  Surgeon: [name], MD;  Location: [facility] INVASIVE CV LAB;  Service: Cardiovascular;  Laterality: N/A;   CAROTID ENDARTERECTOMY Left 2010   CATARACT EXTRACTION, BILATERAL     COLON RESECTION  2008    COLON SURGERY     CORONARY ANGIOPLASTY     CORONARY ARTERY BYPASS GRAFT  10/31/85 & 08/19/06   ENDARTERECTOMY  08/26/2011   Procedure: ENDARTERECTOMY ILIAC;  Surgeon: [name], MD;  Location: [facility] OR;  Service: Vascular;  Laterality: Right;  Right Femoral Artery Endarterectomy with vascu guard patch angioplasty & intraoperative arteriogram.   EYE SURGERY     FEMORAL ENDARTERECTOMY Left  06/2011   ileofemoral endarterectomy with bovine patch angioplasty   IR FLUORO GUIDE CV LINE RIGHT  10/22/2020   IR US GUIDE VASC ACCESS RIGHT  10/22/2020   LEFT HEART CATHETERIZATION WITH CORONARY ANGIOGRAM N/A 07/28/2012   Procedure: LEFT HEART CATHETERIZATION WITH CORONARY ANGIOGRAM;  Surgeon: [name], MD;  Location: [facility] CATH LAB;  Service: Cardiovascular;  Laterality: N/A;   PERIPHERAL VASCULAR BALLOON ANGIOPLASTY Right 09/05/2020   Procedure: PERIPHERAL VASCULAR BALLOON ANGIOPLASTY;  Surgeon: [name], MD;  Location: [facility] INVASIVE CV LAB;  Service: Cardiovascular;  Laterality: Right;  Posterior Tibial   PR VEIN BYPASS GRAFT,AORTO-FEM-POP  10/1985   TONSILLECTOMY     as a child   Social History   Occupational History   Not on file  Tobacco Use   Smoking status: Never   Smokeless tobacco: Never  Vaping Use   Vaping Use: Never used  Substance and Sexual Activity   Alcohol use: No   Drug use: No   Sexual activity: Not on file

## 2020-12-16 ENCOUNTER — Ambulatory Visit: Payer: Medicare PPO | Admitting: Orthopaedic Surgery

## 2020-12-17 DIAGNOSIS — I5022 Chronic systolic (congestive) heart failure: Secondary | ICD-10-CM | POA: Diagnosis not present

## 2020-12-17 DIAGNOSIS — I2510 Atherosclerotic heart disease of native coronary artery without angina pectoris: Secondary | ICD-10-CM | POA: Diagnosis not present

## 2020-12-17 DIAGNOSIS — I252 Old myocardial infarction: Secondary | ICD-10-CM | POA: Diagnosis not present

## 2020-12-17 DIAGNOSIS — N189 Chronic kidney disease, unspecified: Secondary | ICD-10-CM | POA: Diagnosis not present

## 2020-12-17 DIAGNOSIS — I255 Ischemic cardiomyopathy: Secondary | ICD-10-CM | POA: Diagnosis not present

## 2020-12-17 DIAGNOSIS — I70221 Atherosclerosis of native arteries of extremities with rest pain, right leg: Secondary | ICD-10-CM | POA: Diagnosis not present

## 2020-12-17 DIAGNOSIS — I130 Hypertensive heart and chronic kidney disease with heart failure and stage 1 through stage 4 chronic kidney disease, or unspecified chronic kidney disease: Secondary | ICD-10-CM | POA: Diagnosis not present

## 2020-12-17 DIAGNOSIS — M869 Osteomyelitis, unspecified: Secondary | ICD-10-CM | POA: Diagnosis not present

## 2020-12-17 DIAGNOSIS — L97528 Non-pressure chronic ulcer of other part of left foot with other specified severity: Secondary | ICD-10-CM | POA: Diagnosis not present

## 2020-12-19 DIAGNOSIS — I130 Hypertensive heart and chronic kidney disease with heart failure and stage 1 through stage 4 chronic kidney disease, or unspecified chronic kidney disease: Secondary | ICD-10-CM | POA: Diagnosis not present

## 2020-12-19 DIAGNOSIS — M869 Osteomyelitis, unspecified: Secondary | ICD-10-CM | POA: Diagnosis not present

## 2020-12-19 DIAGNOSIS — I252 Old myocardial infarction: Secondary | ICD-10-CM | POA: Diagnosis not present

## 2020-12-19 DIAGNOSIS — I2510 Atherosclerotic heart disease of native coronary artery without angina pectoris: Secondary | ICD-10-CM | POA: Diagnosis not present

## 2020-12-19 DIAGNOSIS — L97528 Non-pressure chronic ulcer of other part of left foot with other specified severity: Secondary | ICD-10-CM | POA: Diagnosis not present

## 2020-12-19 DIAGNOSIS — N189 Chronic kidney disease, unspecified: Secondary | ICD-10-CM | POA: Diagnosis not present

## 2020-12-19 DIAGNOSIS — I70221 Atherosclerosis of native arteries of extremities with rest pain, right leg: Secondary | ICD-10-CM | POA: Diagnosis not present

## 2020-12-19 DIAGNOSIS — I255 Ischemic cardiomyopathy: Secondary | ICD-10-CM | POA: Diagnosis not present

## 2020-12-19 DIAGNOSIS — I5022 Chronic systolic (congestive) heart failure: Secondary | ICD-10-CM | POA: Diagnosis not present

## 2020-12-26 ENCOUNTER — Other Ambulatory Visit: Payer: Self-pay

## 2020-12-26 ENCOUNTER — Encounter (HOSPITAL_BASED_OUTPATIENT_CLINIC_OR_DEPARTMENT_OTHER): Payer: Medicare PPO | Admitting: Internal Medicine

## 2020-12-26 DIAGNOSIS — L97519 Non-pressure chronic ulcer of other part of right foot with unspecified severity: Secondary | ICD-10-CM | POA: Diagnosis not present

## 2020-12-26 DIAGNOSIS — I739 Peripheral vascular disease, unspecified: Secondary | ICD-10-CM | POA: Diagnosis not present

## 2020-12-26 DIAGNOSIS — I5022 Chronic systolic (congestive) heart failure: Secondary | ICD-10-CM | POA: Diagnosis not present

## 2020-12-26 DIAGNOSIS — I110 Hypertensive heart disease with heart failure: Secondary | ICD-10-CM | POA: Diagnosis not present

## 2020-12-26 DIAGNOSIS — G9009 Other idiopathic peripheral autonomic neuropathy: Secondary | ICD-10-CM | POA: Diagnosis not present

## 2020-12-26 DIAGNOSIS — I25119 Atherosclerotic heart disease of native coronary artery with unspecified angina pectoris: Secondary | ICD-10-CM | POA: Diagnosis not present

## 2020-12-26 NOTE — Progress Notes (Addendum)
AIDON, KLEMENS ([id number]) Visit Report for 12/26/2020 Arrival Information Details Patient Name: Date of Service: Karl Stevenson MES C. 12/26/2020 1:30 PM Medical Record Number: [id number] Patient Account Number: [id number] Date of Birth/Sex: Treating RN: 12-13-1926 (87 y.o. Karl Stevenson Primary Care Karl Stevenson: Karl Stevenson Other Clinician: Referring Karl Stevenson: Treating Karl Stevenson/Extender: Karl Stevenson in Treatment: 13 Visit Information History Since Last Visit Added or deleted any medications: No Patient Arrived: Karl Stevenson Any new allergies or adverse reactions: No Arrival Time: 13:36 Had a fall or experienced change in No Accompanied By: Daughter in law activities of daily living that may affect Transfer Assistance: None risk of falls: Patient Identification Verified: Yes Signs or symptoms of abuse/neglect since last visito No Secondary Verification Process Completed: Yes Hospitalized since last visit: No Patient Requires Transmission-Based Precautions: No Implantable device outside of the clinic excluding No Patient Has Alerts: Yes cellular tissue based products placed in the center Patient Alerts: Patient on Blood Thinner since last visit: ABI R=1.17 L=0.87 Has Dressing in Place as Prescribed: Yes TBI R=0.51 L=0.55 Pain Present Now: No Electronic Signature(s) Signed: 12/26/2020 5:51:06 PM By: Karl Stevenson Entered By: Karl Stevenson on 12/26/2020 13:37:41 -------------------------------------------------------------------------------- Complex / Palliative Patient Assessment Details Patient Name: Date of Service: Karl Stevenson, Karl MES C. 12/26/2020 1:30 PM Medical Record Number: [id number] Patient Account Number: [id number] Date of Birth/Sex: Treating RN: 06/25/1926 (87 y.o. Karl Stevenson Primary Care Jazalynn Mireles: Karl Stevenson Other Clinician: Referring Karl Stevenson: Treating Karl Stevenson/Extender: Karl Stevenson in  Treatment: 13 Palliative Management Criteria Complex Wound Management Criteria Patient has remarkable or complex co-morbidities requiring medications or treatments that extend wound healing times. Examples: Diabetes mellitus with chronic renal failure or end stage renal disease requiring dialysis Advanced or poorly controlled rheumatoid arthritis Diabetes mellitus and end stage chronic obstructive pulmonary disease Active cancer with current chemo- or radiation therapy Osteomyelitis, PAD Care Approach Wound Care Plan: Complex Wound Management Electronic Signature(s) Signed: 01/03/2021 12:36:09 PM By: Karl Shan DO Signed: 02/06/2021 4:50:18 PM By: [name] RN, BSN Entered By: [name] on 01/03/2021 07:49:40 -------------------------------------------------------------------------------- Encounter Discharge Information Details Patient Name: Date of Service: Karl Stevenson, Karl MES C. 12/26/2020 1:30 PM Medical Record Number: [id number] Patient Account Number: [id number] Date of Birth/Sex: Treating RN: 1927/02/19 (87 y.o. Karl Stevenson Primary Care Shady Padron: Karl Stevenson Other Clinician: Referring Kaan Tosh: Treating Karl Stevenson/Extender: Karl Stevenson in Treatment: 13 Encounter Discharge Information Items Post Procedure Vitals Discharge Condition: Stable Temperature (F): 97.7 Ambulatory Status: Walker Pulse (bpm): 97 Discharge Destination: Home Respiratory Rate (breaths/min): 18 Transportation: Private Auto Blood Pressure (mmHg): 108/72 Schedule Follow-up Appointment: Yes Clinical Summary of Care: Provided on 12/26/2020 Form Type Recipient Paper Patient Patient Electronic Signature(s) Signed: 12/26/2020 5:51:06 PM By: Karl Stevenson Entered By: Karl Stevenson on 12/26/2020 14:06:51 -------------------------------------------------------------------------------- Lower Extremity Assessment Details Patient Name: Date of Service: Karl Stevenson, Karl MES C. 12/26/2020 1:30 PM Medical Record Number: [id number] Patient Account Number: [id number] Date of Birth/Sex: Treating RN: Jan 17, 1927 (87 y.o. Karl Stevenson Primary Care Kolbi Tofte: Karl Stevenson Other Clinician: Referring Jameica Couts: Treating Diavion Labrador/Extender: Karl Stevenson in Treatment: 13 Edema Assessment Assessed: [name]: No] [name]: Yes] Edema: [Left: Ye] [Right: s] Calf Left: Right: Point of Measurement: 29 cm From Medial Instep 29 cm Ankle Left: Right: Point of Measurement: 8 cm From Medial Instep 20 cm Vascular Assessment Pulses: Dorsalis Pedis Palpable: [Right:Yes] Electronic Signature(s) Signed: 12/26/2020 5:51:06 PM By: Karl Stevenson Entered By: Karl Stevenson on 12/26/2020 13:45:10 -------------------------------------------------------------------------------- Multi Wound  Chart Details Patient Name: Date of Service: Karl Stevenson MES C. 12/26/2020 1:30 PM Medical Record Number: [id number] Patient Account Number: [id number] Date of Birth/Sex: Treating RN: 12-24-1926 (87 y.o. [name], [name] Primary Care Azula Zappia: Karl Stevenson Other Clinician: Referring Edilberto Roosevelt: Treating Verlon Pischke/Extender: Karl Stevenson in Treatment: 13 Vital Signs Height(in): 36 Pulse(bpm): 97 Weight(lbs): 136 Blood Pressure(mmHg): 108/72 Body Mass Index(BMI): 24 Temperature(F): 97.7 Respiratory Rate(breaths/min): 18 Photos: [N/A:N/A] Right, Medial Foot N/A N/A Wound Location: Gradually Appeared N/A N/A Wounding Event: Neuropathic Ulcer-Non Diabetic N/A N/A Primary Etiology: Cataracts, Chronic Obstructive N/A N/A Comorbid History: Pulmonary Disease (COPD), Arrhythmia, Congestive Heart Failure, Coronary Artery Disease, Hypertension, Myocardial Infarction, Peripheral Venous Disease, Crohns 07/23/2020 N/A N/A Date Acquired: 22 N/A N/A Weeks of Treatment: Open N/A N/A Wound Status: 0.4x0.3x0.2 N/A  N/A Measurements L x W x D (cm) 0.094 N/A N/A A (cm) : rea 0.019 N/A N/A Volume (cm) : 92.00% N/A N/A % Reduction in A rea: 96.00% N/A N/A % Reduction in Volume: Full Thickness Without Exposed N/A N/A Classification: Support Structures Medium N/A N/A Exudate A mount: Serosanguineous N/A N/A Exudate Type: red, brown N/A N/A Exudate Color: Well defined, not attached N/A N/A Wound Margin: Large (67-100%) N/A N/A Granulation A mount: Pink, Pale N/A N/A Granulation Quality: None Present (0%) N/A N/A Necrotic A mount: Fat Layer (Subcutaneous Tissue): Yes N/A N/A Exposed Structures: Fascia: No Tendon: No Muscle: No Joint: No Bone: No Small (1-33%) N/A N/A Epithelialization: Debridement - Excisional N/A N/A Debridement: Pre-procedure Verification/Time Out 13:57 N/A N/A Taken: Other N/A N/A Pain Control: Callus, Subcutaneous N/A N/A Tissue Debrided: Skin/Subcutaneous Tissue N/A N/A Level: 0.12 N/A N/A Debridement A (sq cm): rea Curette N/A N/A Instrument: Minimum N/A N/A Bleeding: Pressure N/A N/A Hemostasis A chieved: Procedure was tolerated well N/A N/A Debridement Treatment Response: 0.4x0.3x0.2 N/A N/A Post Debridement Measurements L x W x D (cm) 0.019 N/A N/A Post Debridement Volume: (cm) Calloused periwound N/A N/A Assessment Notes: Debridement N/A N/A Procedures Performed: Treatment Notes Wound #1 (Foot) Wound Laterality: Right, Medial Cleanser Soap and Water Discharge Instruction: May shower and wash wound with dial antibacterial soap and water prior to dressing change. Byram Ancillary Kit - 15 Day Supply Discharge Instruction: Use supplies as instructed; Kit contains: (15) Saline Bullets; (15) 3x3 Gauze; 15 pr Gloves Peri-Wound Care Topical Primary Dressing Promogran Prisma Matrix, 4.34 (sq in) (silver collagen) Discharge Instruction: Moisten collagen with saline or hydrogel Secondary Dressing Woven Gauze Sponges 2x2 in Discharge  Instruction: Apply over primary dressing as directed. Optifoam Non-Adhesive Dressing, 4x4 in Discharge Instruction: ***foam donut***Apply over primary dressing as directed. Secured With Conforming Stretch Gauze Bandage, Sterile 2x75 (in/in) Discharge Instruction: Secure with stretch gauze as directed. 79M Medipore H Soft Cloth Surgical T 4 x 2 (in/yd) ape Discharge Instruction: Secure dressing with tape as directed. Compression Wrap Compression Stockings Add-Ons Electronic Signature(s) Signed: 12/26/2020 2:28:30 PM By: Karl Shan DO Signed: 12/26/2020 5:10:54 PM By: Rhae Hammock RN Entered By: Karl Stevenson on 12/26/2020 14:22:49 -------------------------------------------------------------------------------- Multi-Disciplinary Care Plan Details Patient Name: Date of Service: Karl Stevenson, Karl MES C. 12/26/2020 1:30 PM Medical Record Number: [id number] Patient Account Number: [id number] Date of Birth/Sex: Treating RN: Oct 05, 1926 (87 y.o. Karl Stevenson Primary Care Bralen Wiltgen: Karl Stevenson Other Clinician: Referring Rayansh Herbst: Treating Kenneith Stief/Extender: Karl Stevenson in Treatment: 13 Active Inactive Wound/Skin Impairment Nursing Diagnoses: Knowledge deficit related to ulceration/compromised skin integrity Goals: Patient/caregiver will verbalize understanding of skin care regimen Date Initiated: 09/26/2020 Target Resolution Date: 01/08/2021  Goal Status: Active Interventions: Assess patient/caregiver ability to obtain necessary supplies Assess patient/caregiver ability to perform ulcer/skin care regimen upon admission and as needed Provide education on ulcer and skin care Treatment Activities: Skin care regimen initiated : 09/26/2020 Topical wound management initiated : 09/26/2020 Notes: Electronic Signature(s) Signed: 12/26/2020 5:51:06 PM By: Karl Stevenson Entered By: Karl Stevenson on 12/26/2020  13:46:21 -------------------------------------------------------------------------------- Pain Assessment Details Patient Name: Date of Service: Karl Stevenson, Karl MES C. 12/26/2020 1:30 PM Medical Record Number: [id number] Patient Account Number: [id number] Date of Birth/Sex: Treating RN: 1926/06/15 (87 y.o. Karl Stevenson Primary Care Kaemon Barnett: Karl Stevenson Other Clinician: Referring Laelani Vasko: Treating Shakela Donati/Extender: Karl Stevenson in Treatment: 13 Active Problems Location of Pain Severity and Description of Pain Patient Has Paino No Site Locations Pain Management and Medication Current Pain Management: Electronic Signature(s) Signed: 12/26/2020 5:51:06 PM By: Karl Stevenson Entered By: Karl Stevenson on 12/26/2020 13:39:39 -------------------------------------------------------------------------------- Patient/Caregiver Education Details Patient Name: Date of Service: Karl Stevenson MES C. 8/18/2022andnbsp1:30 PM Medical Record Number: [id number] Patient Account Number: [id number] Date of Birth/Gender: Treating RN: 17-Oct-1926 (87 y.o. Karl Stevenson Primary Care Physician: Karl Stevenson Other Clinician: Referring Physician: Treating Physician/Extender: Karl Stevenson in Treatment: 13 Education Assessment Education Provided To: Patient Education Topics Provided Offloading: Methods: Explain/Verbal, Printed Responses: State content correctly Wound/Skin Impairment: Methods: Explain/Verbal, Printed Responses: State content correctly Electronic Signature(s) Signed: 12/26/2020 5:51:06 PM By: Karl Stevenson Entered By: Karl Stevenson on 12/26/2020 13:46:44 -------------------------------------------------------------------------------- Wound Assessment Details Patient Name: Date of Service: Karl Stevenson, Karl MES C. 12/26/2020 1:30 PM Medical Record Number: [id number] Patient Account Number: [id number] Date of  Birth/Sex: Treating RN: 07-Mar-1927 (87 y.o. Karl Stevenson Primary Care Brylin Stanislawski: Karl Stevenson Other Clinician: Referring Heloise Gordan: Treating [place] Breeding/Extender: Karl Stevenson in Treatment: 13 Wound Status Wound Number: 1 Primary Neuropathic Ulcer-Non Diabetic Etiology: Wound Location: Right, Medial Foot Wound Open Wounding Event: Gradually Appeared Status: Date Acquired: 07/23/2020 Comorbid Cataracts, Chronic Obstructive Pulmonary Disease (COPD), Weeks Of Treatment: 13 History: Arrhythmia, Congestive Heart Failure, Coronary Artery Disease, Clustered Wound: No Hypertension, Myocardial Infarction, Peripheral Venous Disease, Crohns Photos Wound Measurements Length: (cm) 0.4 Width: (cm) 0.3 Depth: (cm) 0.2 Area: (cm) 0.094 Volume: (cm) 0.019 Wound Description Classification: Full Thickness Without Exposed Support Stru Wound Margin: Well defined, not attached Exudate Amount: Medium Exudate Type: Serosanguineous Exudate Color: red, brown Foul Odor After Cleansing: Slough/Fibrino % Reduction in Area: 92% % Reduction in Volume: 96% Epithelialization: Small (1-33%) Tunneling: No Undermining: No ctures No No Wound Bed Granulation Amount: Large (67-100%) Exposed Structure Granulation Quality: Pink, Pale Fascia Exposed: No Necrotic Amount: None Present (0%) Fat Layer (Subcutaneous Tissue) Exposed: Yes Tendon Exposed: No Muscle Exposed: No Joint Exposed: No Bone Exposed: No Assessment Notes Calloused periwound Electronic Signature(s) Signed: 12/26/2020 5:51:06 PM By: Karl Stevenson Entered By: Karl Stevenson on 12/26/2020 13:59:00 -------------------------------------------------------------------------------- Vitals Details Patient Name: Date of Service: Karl Stevenson, Karl MES C. 12/26/2020 1:30 PM Medical Record Number: [id number] Patient Account Number: [id number] Date of Birth/Sex: Treating RN: [birth year] (88 y.o. Karl Stevenson Primary Care Raynard Mapps: Karl Stevenson Other Clinician: Referring Marshia Tropea: Treating Myrah Strawderman/Extender: Karl Stevenson in Treatment: 13 Vital Signs Time Taken: 13:37 Temperature (F): 97.7 Height (in): 63 Pulse (bpm): 97 Weight (lbs): 136 Respiratory Rate (breaths/min): 18 Body Mass Index (BMI): 24.1 Blood Pressure (mmHg): 108/72 Reference Range: 80 - 120 mg / dl Electronic Signature(s) Signed: 12/26/2020 5:51:06 PM By: Karl Stevenson Entered By: Karl Stevenson on 12/26/2020 13:39:18

## 2020-12-26 NOTE — Progress Notes (Signed)
ROWYN, SPILDE ([id number]) Visit Report for 12/26/2020 Chief Complaint Document Details Patient Name: Date of Service: [name] 12/26/2020 1:30 PM Medical Record Number: [id number] Patient Account Number: [id number] Date of Birth/Sex: Treating RN: [birth year] (87 y.o. [name], [name] Primary Care Provider: [name] Other Clinician: Referring Provider: Treating Provider/Extender: [name] in Treatment: 13 Information Obtained from: Patient Chief Complaint Right foot wound Electronic Signature(s) Signed: 12/26/2020 2:28:30 PM By: [name] DO Entered By: [name] on 12/26/2020 14:22:56 -------------------------------------------------------------------------------- Debridement Details Patient Name: Date of Service: [name], [name] 12/26/2020 1:30 PM Medical Record Number: [id number] Patient Account Number: [id number] Date of Birth/Sex: Treating RN: [birth year] (87 y.o. [name] Primary Care Provider: [name] Other Clinician: Referring Provider: Treating Provider/Extender: [name] in Treatment: 13 Debridement Performed for Assessment: Wound #1 Right,Medial Foot Performed By: Physician [name], DO Debridement Type: Debridement Level of Consciousness (Pre-procedure): Awake and Alert Pre-procedure Verification/Time Out Yes - 13:57 Taken: Start Time: 13:58 Pain Control: Other : Benzocaine T Area Debrided (L x W): otal 0.4 (cm) x 0.3 (cm) = 0.12 (cm) Tissue and other material debrided: Non-Viable, Callus, Subcutaneous Level: Skin/Subcutaneous Tissue Debridement Description: Excisional Instrument: Curette Bleeding: Minimum Hemostasis Achieved: Pressure End Time: 14:02 Response to Treatment: Procedure was tolerated well Level of Consciousness (Post- Awake and Alert procedure): Post Debridement Measurements of Total Wound Length: (cm) 0.4 Width:  (cm) 0.3 Depth: (cm) 0.2 Volume: (cm) 0.019 Character of Wound/Ulcer Post Debridement: Stable Post Procedure Diagnosis Same as Pre-procedure Electronic Signature(s) Signed: 12/26/2020 2:28:30 PM By: [name] DO Signed: 12/26/2020 5:51:06 PM By: Lorrin Jackson Entered By: Lorrin Jackson on 12/26/2020 14:02:59 -------------------------------------------------------------------------------- HPI Details Patient Name: Date of Service: [name], [name] 12/26/2020 1:30 PM Medical Record Number: [id number] Patient Account Number: [id number] Date of Birth/Sex: Treating RN: 04-13-1927 (87 y.o. [name], [name] Primary Care Provider: [name] Other Clinician: Referring Provider: Treating Provider/Extender: [name] in Treatment: 13 History of Present Illness HPI Description: Mr. Liliana Brentlinger is a 87 year old male with a past medical history of peripheral vascular disease, coronary artery disease, chronic systolic heart failure, and peripheral neuropathy that presents to our clinic for a 57-monthhistory of right foot wound. He thinks that the wound started by Rubbing against his shoes. He states he was evaluated by his primary care physician who obtained an x-ray of the right foot. They were sent to vein and vascular for further assessment of the blood flow to the lower extremities. He had an aortogram on 09/05/2020 and had a right posterior tibial artery angioplasty. He states that the wound has been stable in appearance and size since the procedure. He has been using Bactroban and covering the area with a Band-Aid. He is using soft bedroom shoes. He denies acute signs of infection. 5/26; patient presents for 1 week follow-up. He has been using collagen to the wound with dressing changes. He has no complaints and denies acute signs of infection today. He is scheduled for an MRI on 6/2 6/6; patient presents for 1 week follow-up. He has been using  collagen to the wound. He has noticed some increased redness to the wound site. He obtained his MRI. 6/13; this is a patient with a wound on the medial aspect of his right first metatarsal head. He had an MRI on 6/2 that suggested a wound along the medial aspect the first metatarsal head extending to bone with underlying osteomyelitis. Bone  culture that was done did not show specific pathogens. The biopsies suggested chronic active inflammation and necrosis without malignancy identified presumably osteomyelitis. The patient saw Dr. West Bali on 6/8. He is to started on daptomycin and ceftriaxone he is getting a PICC line tomorrow. He has been on doxycycline and Augmentin but apparently stopped these because of diarrhea. [facility] for [facility] opinions. He is using silver collagen on the wound. The patient is already been revascularized in late April with a right posterior tibial angioplasty 6/20; patient presents for 1 week follow-up. He has been on IV antibiotics for osteomyelitis for the past 1 to 2 weeks. He denies any issues with the infusions. He overall feels okay 12/11/2020 upon evaluation today patient's wound actually is showing signs of doing decently well in regard to the foot ulcer. I am actually very pleased with where things stand today. There is no signs of active infection at this time which is great news. No fevers, chills, nausea, vomiting, or diarrhea. 8/18; patient presents for 2-week follow-up. He has no issues or complaints today. He continues to use collagen to the wound bed. He reports improvement to the wound healing. He denies signs of infection. Electronic Signature(s) Signed: 12/26/2020 2:28:30 PM By: [name] DO Entered By: [name] on 12/26/2020 14:23:36 -------------------------------------------------------------------------------- Physical Exam Details Patient Name: Date of Service: [name], [name] 12/26/2020 1:30  PM Medical Record Number: [id number] Patient Account Number: [id number] Date of Birth/Sex: Treating RN: 1926-09-10 (87 y.o. [name] Primary Care Provider: [name] Other Clinician: Referring Provider: Treating Provider/Extender: [name] in Treatment: 13 Constitutional respirations regular, non-labored and within target range for patient.. Cardiovascular 2+ dorsalis pedis/posterior tibialis pulses. Psychiatric pleasant and cooperative. Notes Right foot: T the medial aspect there is a small open wound with nonviable tissue and granulation tissue present. Surrounding skin is intact with no signs of o infection. Electronic Signature(s) Signed: 12/26/2020 2:28:30 PM By: [name] DO Entered By: [name] on 12/26/2020 14:24:25 -------------------------------------------------------------------------------- Physician Orders Details Patient Name: Date of Service: [name], [name] 12/26/2020 1:30 PM Medical Record Number: [id number] Patient Account Number: [id number] Date of Birth/Sex: Treating RN: Sep 10, 1926 (87 y.o. [name] Primary Care Provider: [name] Other Clinician: Referring Provider: Treating Provider/Extender: [name] in Treatment: [phone] Verbal / Phone Orders: No Diagnosis Coding ICD-10 Coding Code Description L97.519 Non-pressure chronic ulcer of other part of right foot with unspecified severity I73.9 Peripheral vascular disease, unspecified G90.09 Other idiopathic peripheral autonomic neuropathy W97.98 Chronic systolic (congestive) heart failure I25.119 Atherosclerotic heart disease of native coronary artery with unspecified angina pectoris M86.171 Other acute osteomyelitis, right ankle and foot Follow-up Appointments ppointment in 2 weeks. - Dr. Heber [place] Return A Bathing/ Shower/ Hygiene May shower and wash wound with soap and water. - with dressing  changes only. Edema Control - Lymphedema / SCD / Other Elevate legs to the level of the heart or above for 30 minutes daily and/or when sitting, a frequency of: - 3-4 times throughout the day. Avoid standing for long periods of time. Moisturize legs daily. - every night before bed. Off-Loading Open toe surgical shoe to: - both feet wear while walking. May wear regular shoes or sandals as long as the shoe does not rub on the foot Wound Treatment Wound #1 - Foot Wound Laterality: Right, Medial Cleanser: Soap and Water Every Other Day/30 Days Discharge Instructions: May shower and wash wound with dial  antibacterial soap and water prior to dressing change. Cleanser: Byram Ancillary Kit - 15 Day Supply (Generic) Every Other Day/30 Days Discharge Instructions: Use supplies as instructed; Kit contains: (15) Saline Bullets; (15) 3x3 Gauze; 15 pr Gloves Prim Dressing: Promogran Prisma Matrix, 4.34 (sq in) (silver collagen) Every Other Day/30 Days ary Discharge Instructions: Moisten collagen with saline or hydrogel Secondary Dressing: Woven Gauze Sponges 2x2 in (DME) (Generic) Every Other Day/30 Days Discharge Instructions: Apply over primary dressing as directed. Secondary Dressing: Optifoam Non-Adhesive Dressing, 4x4 in (DME) (Generic) Every Other Day/30 Days Discharge Instructions: ***foam donut***Apply over primary dressing as directed. Secured With: Child psychotherapist, Sterile 2x75 (in/in) (DME) (Generic) Every Other Day/30 Days Discharge Instructions: Secure with stretch gauze as directed. Secured With: 14M Medipore H Soft Cloth Surgical T 4 x 2 (in/yd) (DME) (Generic) Every Other Day/30 Days ape Discharge Instructions: Secure dressing with tape as directed. Electronic Signature(s) Signed: 12/26/2020 2:28:30 PM By: [name] DO Entered By: [name] on 12/26/2020 14:24:43 -------------------------------------------------------------------------------- Problem List  Details Patient Name: Date of Service: [name], [name] 12/26/2020 1:30 PM Medical Record Number: [id number] Patient Account Number: [id number] Date of Birth/Sex: Treating RN: 1926-12-13 (87 y.o. [name], [name] Primary Care Provider: [name] Other Clinician: Referring Provider: Treating Provider/Extender: [name] in Treatment: 13 Active Problems ICD-10 Encounter Code Description Active Date MDM Diagnosis L97.519 Non-pressure chronic ulcer of other part of right foot with unspecified severity 09/26/2020 No Yes I73.9 Peripheral vascular disease, unspecified 09/26/2020 No Yes G90.09 Other idiopathic peripheral autonomic neuropathy 09/26/2020 No Yes M62.94 Chronic systolic (congestive) heart failure 09/26/2020 No Yes I25.119 Atherosclerotic heart disease of native coronary artery with unspecified angina 09/26/2020 No Yes pectoris M86.171 Other acute osteomyelitis, right ankle and foot 10/14/2020 No Yes Inactive Problems Resolved Problems Electronic Signature(s) Signed: 12/26/2020 2:28:30 PM By: [name] DO Entered By: [name] on 12/26/2020 14:16:53 -------------------------------------------------------------------------------- Progress Note Details Patient Name: Date of Service: [name], [name] 12/26/2020 1:30 PM Medical Record Number: [id number] Patient Account Number: [id number] Date of Birth/Sex: Treating RN: 03-02-1927 (87 y.o. [name], [name] Primary Care Provider: [name] Other Clinician: Referring Provider: Treating Provider/Extender: [name] in Treatment: 13 Subjective Chief Complaint Information obtained from Patient Right foot wound History of Present Illness (HPI) Mr. Mickeal Daws is a 87 year old male with a past medical history of peripheral vascular disease, coronary artery disease, chronic systolic heart failure, and peripheral neuropathy that presents  to our clinic for a 54-monthhistory of right foot wound. He thinks that the wound started by Rubbing against his shoes. He states he was evaluated by his primary care physician who obtained an x-ray of the right foot. They were sent to vein and vascular for further assessment of the blood flow to the lower extremities. He had an aortogram on 09/05/2020 and had a right posterior tibial artery angioplasty. He states that the wound has been stable in appearance and size since the procedure. He has been using Bactroban and covering the area with a Band-Aid. He is using soft bedroom shoes. He denies acute signs of infection. 5/26; patient presents for 1 week follow-up. He has been using collagen to the wound with dressing changes. He has no complaints and denies acute signs of infection today. He is scheduled for an MRI on 6/2 6/6; patient presents for 1 week follow-up. He has been using collagen to the wound. He has noticed some increased redness to the wound site. He  obtained his MRI. 6/13; this is a patient with a wound on the medial aspect of his right first metatarsal head. He had an MRI on 6/2 that suggested a wound along the medial aspect the first metatarsal head extending to bone with underlying osteomyelitis. Bone culture that was done did not show specific pathogens. The biopsies suggested chronic active inflammation and necrosis without malignancy identified presumably osteomyelitis. The patient saw Dr. West Bali on 6/8. He is to started on daptomycin and ceftriaxone he is getting a PICC line tomorrow. He has been on doxycycline and Augmentin but apparently stopped these because of diarrhea. [facility] for [facility] opinions. He is using silver collagen on the wound. The patient is already been revascularized in late April with a right posterior tibial angioplasty 6/20; patient presents for 1 week follow-up. He has been on IV antibiotics for osteomyelitis for the  past 1 to 2 weeks. He denies any issues with the infusions. He overall feels okay 12/11/2020 upon evaluation today patient's wound actually is showing signs of doing decently well in regard to the foot ulcer. I am actually very pleased with where things stand today. There is no signs of active infection at this time which is great news. No fevers, chills, nausea, vomiting, or diarrhea. 8/18; patient presents for 2-week follow-up. He has no issues or complaints today. He continues to use collagen to the wound bed. He reports improvement to the wound healing. He denies signs of infection. Patient History Information obtained from Patient. Family History Cancer - Mother, Diabetes - Child, Heart Disease - Father, Lung Disease - Siblings, No family history of Hereditary Spherocytosis, Hypertension, Kidney Disease, Seizures, Stroke, Thyroid Problems, Tuberculosis. Social History Never smoker, Marital Status - Married, Alcohol Use - Rarely, Drug Use - No History, Caffeine Use - Moderate. Medical History Eyes Patient has history of Cataracts Respiratory Patient has history of Chronic Obstructive Pulmonary Disease (COPD) Cardiovascular Patient has history of Arrhythmia, Congestive Heart Failure, Coronary Artery Disease, Hypertension, Myocardial Infarction, Peripheral Venous Disease Gastrointestinal Patient has history of Crohnoos Medical A Surgical History Notes nd Cardiovascular cardiomyopathy, AAA repair Objective Constitutional respirations regular, non-labored and within target range for patient.. Vitals Time Taken: 1:37 PM, Height: 63 in, Weight: 136 lbs, BMI: 24.1, Temperature: 97.7 F, Pulse: 97 bpm, Respiratory Rate: 18 breaths/min, Blood Pressure: 108/72 mmHg. Cardiovascular 2+ dorsalis pedis/posterior tibialis pulses. Psychiatric pleasant and cooperative. General Notes: Right foot: T the medial aspect there is a small open wound with nonviable tissue and granulation tissue  present. Surrounding skin is intact with o no signs of infection. Integumentary (Hair, Skin) Wound #1 status is Open. Original cause of wound was Gradually Appeared. The date acquired was: 07/23/2020. The wound has been in treatment 13 weeks. The wound is located on the Right,Medial Foot. The wound measures 0.4cm length x 0.3cm width x 0.2cm depth; 0.094cm^2 area and 0.019cm^3 volume. There is Fat Layer (Subcutaneous Tissue) exposed. There is no tunneling or undermining noted. There is a medium amount of serosanguineous drainage noted. The wound margin is well defined and not attached to the wound base. There is large (67-100%) pink, pale granulation within the wound bed. There is no necrotic tissue within the wound bed. General Notes: Calloused periwound Assessment Active Problems ICD-10 Non-pressure chronic ulcer of other part of right foot with unspecified severity Peripheral vascular disease, unspecified Other idiopathic peripheral autonomic neuropathy Chronic systolic (congestive) heart failure Atherosclerotic heart disease of native coronary artery with unspecified angina pectoris Other  acute osteomyelitis, right ankle and foot I have not seen this patient in 2 months. He has finished his IV antibiotics for osteomyelitis at the end of July. He was seen in our clinic last week and dressing was changed to collagen. He is overall doing well. He has shown significant improvement in wound healing since I admitted him in May 2022. I debrided nonviable tissue. I recommended continuing with collagen as this has done well for him. I also recommended offloading this area with a donut foam padding. Follow-up in 2 weeks. Procedures Wound #1 Pre-procedure diagnosis of Wound #1 is a Neuropathic Ulcer-Non Diabetic located on the Right,Medial Foot . There was a Excisional Skin/Subcutaneous Tissue Debridement with a total area of 0.12 sq cm performed by [name], DO. With the following  instrument(s): Curette to remove Non-Viable tissue/material. Material removed includes Callus and Subcutaneous Tissue and after achieving pain control using Other (Benzocaine). No specimens were taken. A time out was conducted at 13:57, prior to the start of the procedure. A Minimum amount of bleeding was controlled with Pressure. The procedure was tolerated well. Post Debridement Measurements: 0.4cm length x 0.3cm width x 0.2cm depth; 0.019cm^3 volume. Character of Wound/Ulcer Post Debridement is stable. Post procedure Diagnosis Wound #1: Same as Pre-Procedure Plan Follow-up Appointments: Return Appointment in 2 weeks. - Dr. Heber [place] Bathing/ Shower/ Hygiene: May shower and wash wound with soap and water. - with dressing changes only. Edema Control - Lymphedema / SCD / Other: Elevate legs to the level of the heart or above for 30 minutes daily and/or when sitting, a frequency of: - 3-4 times throughout the day. Avoid standing for long periods of time. Moisturize legs daily. - every night before bed. Off-Loading: Open toe surgical shoe to: - both feet wear while walking. May wear regular shoes or sandals as long as the shoe does not rub on the foot WOUND #1: - Foot Wound Laterality: Right, Medial Cleanser: Soap and Water Every Other Day/30 Days Discharge Instructions: May shower and wash wound with dial antibacterial soap and water prior to dressing change. Cleanser: Byram Ancillary Kit - 15 Day Supply (Generic) Every Other Day/30 Days Discharge Instructions: Use supplies as instructed; Kit contains: (15) Saline Bullets; (15) 3x3 Gauze; 15 pr Gloves Prim Dressing: Promogran Prisma Matrix, 4.34 (sq in) (silver collagen) Every Other Day/30 Days ary Discharge Instructions: Moisten collagen with saline or hydrogel Secondary Dressing: Woven Gauze Sponges 2x2 in (DME) (Generic) Every Other Day/30 Days Discharge Instructions: Apply over primary dressing as directed. Secondary Dressing: Optifoam  Non-Adhesive Dressing, 4x4 in (DME) (Generic) Every Other Day/30 Days Discharge Instructions: ***foam donut***Apply over primary dressing as directed. Secured With: Child psychotherapist, Sterile 2x75 (in/in) (DME) (Generic) Every Other Day/30 Days Discharge Instructions: Secure with stretch gauze as directed. Secured With: 60M Medipore H Soft Cloth Surgical T 4 x 2 (in/yd) (DME) (Generic) Every Other Day/30 Days ape Discharge Instructions: Secure dressing with tape as directed. 1. Continue collagen 2. In office sharp debridement 3. Follow-up in 2 weeks 4. Offloading to the wound site Electronic Signature(s) Signed: 12/26/2020 2:28:30 PM By: [name] DO Entered By: [name] on 12/26/2020 14:27:59 -------------------------------------------------------------------------------- HxROS Details Patient Name: Date of Service: [name], [name] 12/26/2020 1:30 PM Medical Record Number: [id number] Patient Account Number: [id number] Date of Birth/Sex: Treating RN: June 09, 1926 (87 y.o. [name], [name] Primary Care Provider: [name] Other Clinician: Referring Provider: Treating Provider/Extender: [name] in Treatment: 13 Information Obtained From Patient Eyes Medical  History: Positive for: Cataracts Respiratory Medical History: Positive for: Chronic Obstructive Pulmonary Disease (COPD) Cardiovascular Medical History: Positive for: Arrhythmia; Congestive Heart Failure; Coronary Artery Disease; Hypertension; Myocardial Infarction; Peripheral Venous Disease Past Medical History Notes: cardiomyopathy, AAA repair Gastrointestinal Medical History: Positive for: Crohns HBO Extended History Items Eyes: Cataracts Immunizations Pneumococcal Vaccine: Received Pneumococcal Vaccination: Yes Received Pneumococcal Vaccination On or After 60th Birthday: No Implantable Devices None Family and Social History Cancer: Yes -  Mother; Diabetes: Yes - Child; Heart Disease: Yes - Father; Hereditary Spherocytosis: No; Hypertension: No; Kidney Disease: No; Lung Disease: Yes - Siblings; Seizures: No; Stroke: No; Thyroid Problems: No; Tuberculosis: No; Never smoker; Marital Status - Married; Alcohol Use: Rarely; Drug Use: No History; Caffeine Use: Moderate; Financial Concerns: No; Food, Clothing or Shelter Needs: No; Support System Lacking: No; Transportation Concerns: No Electronic Signature(s) Signed: 12/26/2020 2:28:30 PM By: [name] DO Signed: 12/26/2020 5:10:54 PM By: Rhae Hammock RN Entered By: [name] on 12/26/2020 14:23:54 -------------------------------------------------------------------------------- SuperBill Details Patient Name: Date of Service: [name], [place] MES C. 12/26/2020 Medical Record Number: [id number] Patient Account Number: [id number] Date of Birth/Sex: Treating RN: 30-Nov-1926 (87 y.o. [name] Primary Care Provider: [name] Other Clinician: Referring Provider: Treating Provider/Extender: [name] in Treatment: 13 Diagnosis Coding ICD-10 Codes Code Description [phone] Non-pressure chronic ulcer of other part of right foot with unspecified severity I73.9 Peripheral vascular disease, unspecified G90.09 Other idiopathic peripheral autonomic neuropathy W23.76 Chronic systolic (congestive) heart failure I25.119 Atherosclerotic heart disease of native coronary artery with unspecified angina pectoris M86.171 Other acute osteomyelitis, right ankle and foot Facility Procedures CPT4 Code: [id number] Description: 16073 - DEB SUBQ TISSUE 20 SQ CM/< ICD-10 Diagnosis Description L97.519 Non-pressure chronic ulcer of other part of right foot with unspecified severi Modifier: ty Quantity: 1 Physician Procedures : CPT4 Code Description Modifier [id number] - WC PHYS SUBQ TISS 20 SQ CM ICD-10 Diagnosis Description L97.519 Non-pressure  chronic ulcer of other part of right foot with unspecified severity Quantity: 1 Electronic Signature(s) Signed: 12/26/2020 2:28:30 PM By: [name] DO Entered By: [name] on 12/26/2020 14:28:07

## 2020-12-27 DIAGNOSIS — G9009 Other idiopathic peripheral autonomic neuropathy: Secondary | ICD-10-CM | POA: Diagnosis not present

## 2020-12-27 DIAGNOSIS — L97519 Non-pressure chronic ulcer of other part of right foot with unspecified severity: Secondary | ICD-10-CM | POA: Diagnosis not present

## 2020-12-27 DIAGNOSIS — I5022 Chronic systolic (congestive) heart failure: Secondary | ICD-10-CM | POA: Diagnosis not present

## 2020-12-27 DIAGNOSIS — I739 Peripheral vascular disease, unspecified: Secondary | ICD-10-CM | POA: Diagnosis not present

## 2021-01-02 DIAGNOSIS — L570 Actinic keratosis: Secondary | ICD-10-CM | POA: Diagnosis not present

## 2021-01-02 DIAGNOSIS — L98499 Non-pressure chronic ulcer of skin of other sites with unspecified severity: Secondary | ICD-10-CM | POA: Diagnosis not present

## 2021-01-02 DIAGNOSIS — D485 Neoplasm of uncertain behavior of skin: Secondary | ICD-10-CM | POA: Diagnosis not present

## 2021-01-03 DIAGNOSIS — L97519 Non-pressure chronic ulcer of other part of right foot with unspecified severity: Secondary | ICD-10-CM | POA: Diagnosis not present

## 2021-01-03 DIAGNOSIS — G9009 Other idiopathic peripheral autonomic neuropathy: Secondary | ICD-10-CM | POA: Diagnosis not present

## 2021-01-03 DIAGNOSIS — I5022 Chronic systolic (congestive) heart failure: Secondary | ICD-10-CM | POA: Diagnosis not present

## 2021-01-03 DIAGNOSIS — I739 Peripheral vascular disease, unspecified: Secondary | ICD-10-CM | POA: Diagnosis not present

## 2021-01-09 ENCOUNTER — Encounter (HOSPITAL_BASED_OUTPATIENT_CLINIC_OR_DEPARTMENT_OTHER): Payer: Medicare PPO | Admitting: Internal Medicine

## 2021-01-14 ENCOUNTER — Other Ambulatory Visit: Payer: Self-pay

## 2021-01-14 MED ORDER — ISOSORBIDE MONONITRATE ER 120 MG PO TB24: 120.0000 mg | ORAL_TABLET | Freq: Every day | ORAL | 3 refills | Status: AC

## 2021-01-14 MED ORDER — ISOSORBIDE MONONITRATE ER 60 MG PO TB24: 60.0000 mg | ORAL_TABLET | Freq: Every day | ORAL | 3 refills | Status: AC

## 2021-01-15 ENCOUNTER — Encounter: Payer: Self-pay | Admitting: Orthopaedic Surgery

## 2021-01-15 ENCOUNTER — Ambulatory Visit: Payer: Medicare PPO | Admitting: Orthopaedic Surgery

## 2021-01-15 DIAGNOSIS — S42011D Anterior displaced fracture of sternal end of right clavicle, subsequent encounter for fracture with routine healing: Secondary | ICD-10-CM

## 2021-01-15 NOTE — Progress Notes (Signed)
Patient is a 87 year old frail individual who is just over 2 months into a right clavicle fracture that is near the sternal aspect of the clavicle.  He says he is doing much better overall at 8 weeks and has just some mild discomfort and pain when reaching overhead.  He does ambulate with a rolling walker and does not seem to have issues with that.  On exam I had him get up from a seated position and use both arms pushing of the chair with no discomfort at all.  There is an obvious cosmetic deformity of the mid to medial clavicle near the sternal area but there is no soft tissue compromise and his pain is minimal in this area.  We did not rex-ray him today.  I gave him reassurance that this will continue to improve with time.  He understands that as well and does report that he is much better overall.  All questions and concerns were answered and addressed.  Follow-up can be as needed.

## 2021-01-16 ENCOUNTER — Other Ambulatory Visit: Payer: Self-pay

## 2021-01-16 ENCOUNTER — Encounter (HOSPITAL_BASED_OUTPATIENT_CLINIC_OR_DEPARTMENT_OTHER): Payer: Medicare PPO | Attending: Internal Medicine | Admitting: Internal Medicine

## 2021-01-16 DIAGNOSIS — M86171 Other acute osteomyelitis, right ankle and foot: Secondary | ICD-10-CM | POA: Diagnosis not present

## 2021-01-16 DIAGNOSIS — I739 Peripheral vascular disease, unspecified: Secondary | ICD-10-CM | POA: Diagnosis not present

## 2021-01-16 DIAGNOSIS — L97519 Non-pressure chronic ulcer of other part of right foot with unspecified severity: Secondary | ICD-10-CM | POA: Diagnosis not present

## 2021-01-16 DIAGNOSIS — G9009 Other idiopathic peripheral autonomic neuropathy: Secondary | ICD-10-CM | POA: Insufficient documentation

## 2021-01-16 DIAGNOSIS — G629 Polyneuropathy, unspecified: Secondary | ICD-10-CM | POA: Insufficient documentation

## 2021-01-16 DIAGNOSIS — Z833 Family history of diabetes mellitus: Secondary | ICD-10-CM | POA: Insufficient documentation

## 2021-01-16 DIAGNOSIS — S51001A Unspecified open wound of right elbow, initial encounter: Secondary | ICD-10-CM | POA: Insufficient documentation

## 2021-01-16 DIAGNOSIS — X58XXXA Exposure to other specified factors, initial encounter: Secondary | ICD-10-CM | POA: Diagnosis not present

## 2021-01-16 DIAGNOSIS — I5022 Chronic systolic (congestive) heart failure: Secondary | ICD-10-CM | POA: Diagnosis not present

## 2021-01-16 DIAGNOSIS — I110 Hypertensive heart disease with heart failure: Secondary | ICD-10-CM | POA: Diagnosis not present

## 2021-01-16 NOTE — Progress Notes (Signed)
LAIDEN, MILLES ([id number]) Visit Report for 01/16/2021 Arrival Information Details Patient Name: Date of Service: Karl Stevenson MES C. 01/16/2021 2:45 PM Medical Record Number: [id number] Patient Account Number: [id number] Date of Birth/Sex: Treating RN: 13-Jul-1926 (87 y.o. Karl Stevenson Primary Care Karl Stevenson: Karl Stevenson Karl Stevenson in Treatment: 50 Visit Information History Since Last Visit Added or deleted any medications: No Patient Arrived: Karl Stevenson Any new allergies or adverse reactions: No Arrival Time: 15:46 Had a fall or experienced change in No Accompanied By: wife activities of daily living that may affect Transfer Assistance: None risk of falls: Patient Identification Verified: Yes Signs or symptoms of abuse/neglect since last visito No Secondary Verification Process Completed: Yes Hospitalized since last visit: No Patient Requires Transmission-Based Precautions: No Implantable device outside of the clinic excluding No Patient Has Alerts: Yes cellular tissue based products placed in the center Patient Alerts: Patient on Blood Thinner since last visit: ABI R=1.17 L=0.87 Has Dressing in Place as Prescribed: Yes TBI R=0.51 L=0.55 Pain Present Now: No Electronic Signature(s) Signed: 01/16/2021 6:29:12 PM By: Karl Hurst RN, BSN Entered By: Karl Stevenson on 01/16/2021 15:57:59 -------------------------------------------------------------------------------- Lower Extremity Assessment Details Patient Name: Date of Service: Karl Stevenson, Karl MES C. 01/16/2021 2:45 PM Medical Record Number: [id number] Patient Account Number: [id number] Date of Birth/Sex: Treating RN: 07/31/1926 (87 y.o. Karl Stevenson Primary Care Lior Cartelli: Karl Stevenson Karl Clinician: Referring Ricca Melgarejo: Treating Cilicia Borden/Extender: Darrold Stevenson in Treatment: 16 Edema  Assessment Assessed: [name]: No] [name]: No] Edema: [Left: Ye] [Right: s] Calf Left: Right: Point of Measurement: 29 cm From Medial Instep 29 cm Ankle Left: Right: Point of Measurement: 8 cm From Medial Instep 20 cm Vascular Assessment Pulses: Dorsalis Pedis Palpable: [Right:Yes] Electronic Signature(s) Signed: 01/16/2021 6:29:12 PM By: Karl Hurst RN, BSN Entered By: Karl Stevenson on 01/16/2021 15:58:38 -------------------------------------------------------------------------------- Multi Wound Chart Details Patient Name: Date of Service: Karl Stevenson, Karl MES C. 01/16/2021 2:45 PM Medical Record Number: [id number] Patient Account Number: [id number] Date of Birth/Sex: Treating RN: [birth year] (87 y.o. [name], [name] Primary Care Anastacia Reinecke: Karl Stevenson Karl Clinician: Referring Akire Rennert: Treating Shavell Nored/Extender: Darrold Stevenson in Treatment: 16 Vital Signs Height(in): 93 Pulse(bpm): 86 Weight(lbs): 136 Blood Pressure(mmHg): 126/63 Body Mass Index(BMI): 24 Temperature(F): 97.7 Respiratory Rate(breaths/min): 16 Photos: [2:No Photos] [N/A:N/A] Right, Medial Foot Right Elbow N/A Wound Location: Gradually Appeared Trauma N/A Wounding Event: Neuropathic Ulcer-Non Diabetic Trauma, Karl N/A Primary Etiology: Cataracts, Chronic Obstructive Cataracts, Chronic Obstructive N/A Comorbid History: Pulmonary Disease (COPD), Pulmonary Disease (COPD), Arrhythmia, Congestive Heart Failure,Arrhythmia, Congestive Heart Failure, Coronary Artery Disease, Coronary Artery Disease, Hypertension, Myocardial Infarction, Hypertension, Myocardial Infarction, Peripheral Venous Disease, Crohns Peripheral Venous Disease, Crohns 07/23/2020 11/11/2020 N/A Date Acquired: 16 0 N/A Weeks of Treatment: Open Open N/A Wound Status: 0.6x0.4x0.1 1x0.6x0.5 N/A Measurements L x W x D (cm) 0.188 0.471 N/A A (cm) : rea 0.019 0.236 N/A Volume (cm) : 84.00% N/A N/A %  Reduction in A rea: 96.00% N/A N/A % Reduction in Volume: 6 Starting Position 1 (o'clock): 10 Ending Position 1 (o'clock): 3.8 Maximum Distance 1 (cm): No Yes N/A Undermining: Full Thickness Without Exposed Full Thickness Without Exposed N/A Classification: Support Structures Support Structures Medium Medium N/A Exudate A mount: Serosanguineous Serosanguineous N/A Exudate Type: red, brown red, brown N/A Exudate Color: Well defined, not attached Well defined, not attached N/A Wound Margin: Large (67-100%) Small (1-33%) N/A Granulation Amount: Pink Pink N/A Granulation Quality: None Present (0%) Large (  67-100%) N/A Necrotic Amount: N/A Eschar, Adherent Slough N/A Necrotic Tissue: Fat Layer (Subcutaneous Tissue): Yes Fat Layer (Subcutaneous Tissue): Yes N/A Exposed Structures: Fascia: No Fascia: No Tendon: No Tendon: No Muscle: No Muscle: No Joint: No Joint: No Bone: No Bone: No Medium (34-66%) None N/A Epithelialization: Treatment Notes Electronic Signature(s) Signed: 01/16/2021 5:19:52 PM By: [name] DO Signed: 01/16/2021 5:40:25 PM By: Deon Pilling RN, BSN Entered By: [name] on 01/16/2021 16:52:57 -------------------------------------------------------------------------------- Multi-Disciplinary Care Plan Details Patient Name: Date of Service: Karl Stevenson, Karl MES C. 01/16/2021 2:45 PM Medical Record Number: [id number] Patient Account Number: [id number] Date of Birth/Sex: Treating RN: 1926-12-04 (87 y.o. Karl Stevenson Primary Care Navea Woodrow: Karl Stevenson Karl Clinician: Referring Bellarose Burtt: Treating Esma Kilts/Extender: Darrold Stevenson in Treatment: 16 Active Inactive Wound/Skin Impairment Nursing Diagnoses: Knowledge deficit related to ulceration/compromised skin integrity Goals: Patient/caregiver will verbalize understanding of skin care regimen Date Initiated: 09/26/2020 Target Resolution Date:  02-21-2021 Goal Status: Active Interventions: Assess patient/caregiver ability to obtain necessary supplies Assess patient/caregiver ability to perform ulcer/skin care regimen upon admission and as needed Provide education on ulcer and skin care Treatment Activities: Skin care regimen initiated : 09/26/2020 Topical wound management initiated : 09/26/2020 Notes: Electronic Signature(s) Signed: 01/16/2021 6:29:12 PM By: Karl Hurst RN, BSN Entered By: Karl Stevenson on 01/16/2021 17:34:45 -------------------------------------------------------------------------------- Pain Assessment Details Patient Name: Date of Service: Karl Stevenson, Karl MES C. 01/16/2021 2:45 PM Medical Record Number: [id number] Patient Account Number: [id number] Date of Birth/Sex: Treating RN: July 07, 1926 (87 y.o. Karl Stevenson Primary Care Aylee Littrell: Karl Stevenson Karl Clinician: Referring Kemper Heupel: Treating Noach Calvillo/Extender: Darrold Stevenson in Treatment: 16 Active Problems Location of Pain Severity and Description of Pain Patient Has Paino No Patient Has Paino No Site Locations Pain Management and Medication Current Pain Management: Electronic Signature(s) Signed: 01/16/2021 6:29:12 PM By: Karl Hurst RN, BSN Entered By: Karl Stevenson on 01/16/2021 15:58:33 -------------------------------------------------------------------------------- Patient/Caregiver Education Details Patient Name: Date of Service: Karl Stevenson MES C. 9/8/2022andnbsp2:45 PM Medical Record Number: [id number] Patient Account Number: [id number] Date of Birth/Gender: Treating RN: 24-Aug-1926 (87 y.o. Karl Stevenson Primary Care Physician: Karl Stevenson Karl Clinician: Referring Physician: Treating Physician/Extender: Darrold Stevenson in Treatment: 16 Education Assessment Education Provided To: Patient Education Topics Provided Wound/Skin Impairment: Methods:  Explain/Verbal Responses: State content correctly [employer]) Signed: 01/16/2021 6:29:12 PM By: Karl Hurst RN, BSN Entered By: Karl Stevenson on 01/16/2021 17:34:55 -------------------------------------------------------------------------------- Wound Assessment Details Patient Name: Date of Service: Karl Stevenson, Karl MES C. 01/16/2021 2:45 PM Medical Record Number: [id number] Patient Account Number: [id number] Date of Birth/Sex: Treating RN: 25-Mar-1927 (87 y.o. Karl Stevenson Primary Care Srinidhi Landers: Karl Stevenson Karl Clinician: Referring Tyreck Bell: Treating Gaytha Raybourn/Extender: Darrold Stevenson in Treatment: 16 Wound Status Wound Number: 1 Primary Neuropathic Ulcer-Non Diabetic Etiology: Wound Location: Right, Medial Foot Wound Open Wounding Event: Gradually Appeared Status: Date Acquired: 07/23/2020 Comorbid Cataracts, Chronic Obstructive Pulmonary Disease (COPD), Weeks Of Treatment: 16 History: Arrhythmia, Congestive Heart Failure, Coronary Artery Disease, Clustered Wound: No Hypertension, Myocardial Infarction, Peripheral Venous Disease, Crohns Photos Wound Measurements Length: (cm) 0.6 Width: (cm) 0.4 Depth: (cm) 0.1 Area: (cm) 0.188 Volume: (cm) 0.019 % Reduction in Area: 84% % Reduction in Volume: 96% Epithelialization: Medium (34-66%) Tunneling: No Undermining: No Wound Description Classification: Full Thickness Without Exposed Support Structures Wound Margin: Well defined, not attached Exudate Amount: Medium Exudate Type: Serosanguineous Exudate Color: red, brown Foul Odor After Cleansing: No Slough/Fibrino No Wound Bed Granulation Amount: Large (67-100%) Exposed  Structure Granulation Quality: Pink Fascia Exposed: No Necrotic Amount: None Present (0%) Fat Layer (Subcutaneous Tissue) Exposed: Yes Tendon Exposed: No Muscle Exposed: No Joint Exposed: No Bone Exposed: No Treatment Notes Wound #1 (Foot) Wound  Laterality: Right, Medial Cleanser Soap and Water Discharge Instruction: May shower and wash wound with dial antibacterial soap and water prior to dressing change. Byram Ancillary Kit - 15 Day Supply Discharge Instruction: Use supplies as instructed; Kit contains: (15) Saline Bullets; (15) 3x3 Gauze; 15 pr Gloves Peri-Wound Care Topical Primary Dressing Endoform 2x2 in Discharge Instruction: Moisten with saline Secondary Dressing Woven Gauze Sponges 2x2 in Discharge Instruction: Apply over primary dressing as directed. Optifoam Non-Adhesive Dressing, 4x4 in Discharge Instruction: ***foam donut***Apply over primary dressing as directed. Secured With Conforming Stretch Gauze Bandage, Sterile 2x75 (in/in) Discharge Instruction: Secure with stretch gauze as directed. Paper Tape, 1x10 (in/yd) Discharge Instruction: Secure dressing with tape as directed. Compression Wrap Compression Stockings Add-Ons Electronic Signature(s) Signed: 01/16/2021 6:29:12 PM By: Karl Hurst RN, BSN Entered By: Karl Stevenson on 01/16/2021 15:55:53 -------------------------------------------------------------------------------- Wound Assessment Details Patient Name: Date of Service: Karl Stevenson, Karl MES C. 01/16/2021 2:45 PM Medical Record Number: [id number] Patient Account Number: [id number] Date of Birth/Sex: Treating RN: [birth year] (87 y.o. Karl Stevenson Primary Care Reianna Batdorf: Karl Stevenson Karl Clinician: Referring Nayelli Inglis: Treating Ameliyah Sarno/Extender: Darrold Stevenson in Treatment: 16 Wound Status Wound Number: 2 Primary Trauma, Karl Etiology: Wound Location: Right Elbow Wound Open Wounding Event: Trauma Status: Date Acquired: 11/11/2020 Comorbid Cataracts, Chronic Obstructive Pulmonary Disease (COPD), Weeks Of Treatment: 0 History: Arrhythmia, Congestive Heart Failure, Coronary Artery Disease, Clustered Wound: No Hypertension, Myocardial Infarction, Peripheral  Venous Disease, Crohns Wound Measurements Length: (cm) 1 Width: (cm) 0.6 Depth: (cm) 0.5 Area: (cm) 0.471 Volume: (cm) 0.236 % Reduction in Area: % Reduction in Volume: Epithelialization: None Tunneling: No Undermining: Yes Starting Position (o'clock): 6 Ending Position (o'clock): 10 Maximum Distance: (cm) 3.8 Wound Description Classification: Full Thickness Without Exposed Support Structures Wound Margin: Well defined, not attached Exudate Amount: Medium Exudate Type: Serosanguineous Exudate Color: red, brown Foul Odor After Cleansing: No Slough/Fibrino Yes Wound Bed Granulation Amount: Small (1-33%) Exposed Structure Granulation Quality: Pink Fascia Exposed: No Necrotic Amount: Large (67-100%) Fat Layer (Subcutaneous Tissue) Exposed: Yes Necrotic Quality: Eschar, Adherent Slough Tendon Exposed: No Muscle Exposed: No Joint Exposed: No Bone Exposed: No Treatment Notes Wound #2 (Elbow) Wound Laterality: Right Cleanser Soap and Water Discharge Instruction: May shower and wash wound with dial antibacterial soap and water prior to dressing change. Byram Ancillary Kit - 15 Day Supply Discharge Instruction: Use supplies as instructed; Kit contains: (15) Saline Bullets; (15) 3x3 Gauze; 15 pr Gloves Peri-Wound Care Topical Primary Dressing KerraCel Ag Gelling Fiber Dressing, 2x2 in (silver alginate) Discharge Instruction: Apply silver alginate over Santyl, do not pack. Santyl Ointment Discharge Instruction: Apply nickel thick amount to wound bed, apply silver alginate over Santyl Secondary Dressing Bordered Gauze, 2x3.75 in Discharge Instruction: Apply over primary dressing as directed. Secured With Compression Wrap Compression Stockings Environmental education officer) Signed: 01/16/2021 6:29:12 PM By: Karl Hurst RN, BSN Entered By: Karl Stevenson on 01/16/2021 16:05:03 -------------------------------------------------------------------------------- [place]  Details Patient Name: Date of Service: Karl Stevenson, Karl MES C. 01/16/2021 2:45 PM Medical Record Number: [id number] Patient Account Number: [id number] Date of Birth/Sex: Treating RN: 03-Oct-1926 (87 y.o. Karl Stevenson Primary Care Kyser Wandel: Karl Stevenson Karl Clinician: Referring Annia Gomm: Treating Naziyah Tieszen/Extender: Darrold Stevenson in Treatment: 16 Vital Signs Time Taken: 15:46 Temperature (F): 97.7 Height (in): 63  Pulse (bpm): 77 Weight (lbs): 136 Respiratory Rate (breaths/min): 16 Body Mass Index (BMI): 24.1 Blood Pressure (mmHg): 126/63 Reference Range: 80 - 120 mg / dl Electronic Signature(s) Signed: 01/16/2021 6:29:12 PM By: Karl Hurst RN, BSN Entered By: Karl Stevenson on 01/16/2021 15:58:17

## 2021-01-17 DIAGNOSIS — I5022 Chronic systolic (congestive) heart failure: Secondary | ICD-10-CM | POA: Diagnosis not present

## 2021-01-17 DIAGNOSIS — L97519 Non-pressure chronic ulcer of other part of right foot with unspecified severity: Secondary | ICD-10-CM | POA: Diagnosis not present

## 2021-01-17 DIAGNOSIS — S51009A Unspecified open wound of unspecified elbow, initial encounter: Secondary | ICD-10-CM | POA: Diagnosis not present

## 2021-01-17 DIAGNOSIS — G9009 Other idiopathic peripheral autonomic neuropathy: Secondary | ICD-10-CM | POA: Diagnosis not present

## 2021-01-17 DIAGNOSIS — I739 Peripheral vascular disease, unspecified: Secondary | ICD-10-CM | POA: Diagnosis not present

## 2021-01-17 NOTE — Progress Notes (Signed)
KITAI, PURDOM ([id number]) Visit Report for 01/16/2021 Chief Complaint Document Details Patient Name: Date of Service: Karl Stevenson MES C. 01/16/2021 2:45 PM Medical Record Number: [id number] Patient Account Number: [id number] Date of Birth/Sex: Treating RN: [birth year] (87 y.o. [name] Primary Care Provider: [name] Other Clinician: Referring Provider: Treating Provider/Extender: [name] in Treatment: 16 Information Obtained from: Patient Chief Complaint Right foot wound 9/8; right elbow wound Electronic Signature(s) Signed: 01/16/2021 5:19:52 PM By: [name] DO Entered By: [name] on 01/16/2021 16:53:16 -------------------------------------------------------------------------------- Debridement Details Patient Name: Date of Service: Karl Stevenson, Karl MES C. 01/16/2021 2:45 PM Medical Record Number: [id number] Patient Account Number: [id number] Date of Birth/Sex: Treating RN: 1926/08/08 (87 y.o. [name] Primary Care Provider: [name] Other Clinician: Referring Provider: Treating Provider/Extender: [name] in Treatment: 16 Debridement Performed for Assessment: Wound #2 Right Elbow Performed By: Clinician Deon Pilling, RN Debridement Type: Chemical/Enzymatic/Mechanical Agent Used: Santyl Level of Consciousness (Pre-procedure): Awake and Alert Pre-procedure Verification/Time Out Yes - 16:00 Taken: Start Time: 16:00 Bleeding: None Procedural Pain: 0 Post Procedural Pain: 0 Response to Treatment: Procedure was tolerated well Level of Consciousness (Post- Awake and Alert procedure): Post Debridement Measurements of Total Wound Length: (cm) 1 Width: (cm) 0.6 Depth: (cm) 0.5 Volume: (cm) 0.236 Character of Wound/Ulcer Post Debridement: Requires Further Debridement Post Procedure Diagnosis Same as Pre-procedure Electronic Signature(s) Signed: 01/16/2021 5:44:20 PM  By: [name] DO Signed: 01/16/2021 6:29:12 PM By: [name] RN, BSN Entered By: [name] on 01/16/2021 17:36:10 -------------------------------------------------------------------------------- HPI Details Patient Name: Date of Service: Karl Stevenson, Karl MES C. 01/16/2021 2:45 PM Medical Record Number: [id number] Patient Account Number: [id number] Date of Birth/Sex: Treating RN: [birth year] (87 y.o. [name] Primary Care Provider: [name] Other Clinician: Referring Provider: Treating Provider/Extender: [name] in Treatment: 31 History of Present Illness HPI Description: Karl Stevenson is a 87 year old male with a past medical history of peripheral vascular disease, coronary artery disease, chronic systolic heart failure, and peripheral neuropathy that presents to our clinic for a 20-monthhistory of right foot wound. He thinks that the wound started by Rubbing against his shoes. He states he was evaluated by his primary care physician who obtained an x-ray of the right foot. They were sent to vein and vascular for further assessment of the blood flow to the lower extremities. He had an aortogram on 09/05/2020 and had a right posterior tibial artery angioplasty. He states that the wound has been stable in appearance and size since the procedure. He has been using Bactroban and covering the area with a Band-Aid. He is using soft bedroom shoes. He denies acute signs of infection. 5/26; patient presents for 1 week follow-up. He has been using collagen to the wound with dressing changes. He has no complaints and denies acute signs of infection today. He is scheduled for an MRI on 6/2 6/6; patient presents for 1 week follow-up. He has been using collagen to the wound. He has noticed some increased redness to the wound site. He obtained his MRI. 6/13; this is a patient with a wound on the medial aspect of his right first metatarsal head. He  had an MRI on 6/2 that suggested a wound along the medial aspect the first metatarsal head extending to bone with underlying osteomyelitis. Bone culture that was done did not show specific pathogens. The biopsies suggested chronic active inflammation and necrosis without malignancy identified presumably osteomyelitis. The patient  saw Dr. West Bali on 6/8. He is to started on daptomycin and ceftriaxone he is getting a PICC line tomorrow. He has been on doxycycline and Augmentin but apparently stopped these because of diarrhea. [facility] also [facility] for [facility] opinions. He is using silver collagen on the wound. The patient is already been revascularized in late April with a right posterior tibial angioplasty 6/20; patient presents for 1 week follow-up. He has been on IV antibiotics for osteomyelitis for the past 1 to 2 weeks. He denies any issues with the infusions. He overall feels okay 12/11/2020 upon evaluation today patient's wound actually is showing signs of doing decently well in regard to the foot ulcer. I am actually very pleased with where things stand today. There is no signs of active infection at this time which is great news. No fevers, chills, nausea, vomiting, or diarrhea. 8/18; patient presents for 2-week follow-up. He has no issues or complaints today. He continues to use collagen to the wound bed. He reports improvement to the wound healing. He denies signs of infection. 9/8; patient presents for 2-week follow-up. He continues to use collagen on the right medial foot wound. He has no issues or complaints about this today. He has a wound to his right elbow that has been nonhealing for the past 2 months. He has been keeping the area covered. Electronic Signature(s) Signed: 01/16/2021 5:19:52 PM By: [name] DO Entered By: [name] on 01/16/2021 16:54:37 -------------------------------------------------------------------------------- Physical Exam  Details Patient Name: Date of Service: Karl Stevenson, Karl MES C. 01/16/2021 2:45 PM Medical Record Number: [id number] Patient Account Number: [id number] Date of Birth/Sex: Treating RN: [birth year] (87 y.o. [name] Primary Care Provider: [name] Other Clinician: Referring Provider: Treating Provider/Extender: [name] in Treatment: 16 Constitutional respirations regular, non-labored and within target range for patient.. Cardiovascular 2+ dorsalis pedis/posterior tibialis pulses. Psychiatric pleasant and cooperative. Notes Right foot: T the medial aspect there is a small open wound with granulation tissue present. Surrounding skin is intact with no signs of infection. o Right elbow: Open wound with nonviable tissue and significant undermining Electronic Signature(s) Signed: 01/16/2021 5:19:52 PM By: [name] DO Entered By: [name] on 01/16/2021 17:12:32 -------------------------------------------------------------------------------- Physician Orders Details Patient Name: Date of Service: Karl Stevenson, Karl MES C. 01/16/2021 2:45 PM Medical Record Number: [id number] Patient Account Number: [id number] Date of Birth/Sex: Treating RN: Jul 11, 1926 (88 y.o. [name] Primary Care Provider: [name] Other Clinician: Referring Provider: Treating Provider/Extender: [name] in Treatment: [phone] Verbal / Phone Orders: No Diagnosis Coding ICD-10 Coding Code Description L97.519 Non-pressure chronic ulcer of other part of right foot with unspecified severity I73.9 Peripheral vascular disease, unspecified G90.09 Other idiopathic peripheral autonomic neuropathy R44.31 Chronic systolic (congestive) heart failure I25.119 Atherosclerotic heart disease of native coronary artery with unspecified angina pectoris M86.171 Other acute osteomyelitis, right ankle and foot Follow-up Appointments ppointment in 1  week. - with Dr. Heber Tomales Return A Bathing/ Shower/ Hygiene May shower and wash wound with soap and water. - with dressing changes only. Edema Control - Lymphedema / SCD / Other Elevate legs to the level of the heart or above for 30 minutes daily and/or when sitting, a frequency of: - 3-4 times throughout the day. Avoid standing for long periods of time. Moisturize legs daily. - every night before bed. Off-Loading Open toe surgical shoe to: - both feet wear while walking. May wear regular shoes or sandals as long as the  shoe does not rub on the foot Wound Treatment Wound #1 - Foot Wound Laterality: Right, Medial Cleanser: Soap and Water Every Other Day/15 Days Discharge Instructions: May shower and wash wound with dial antibacterial soap and water prior to dressing change. Cleanser: Byram Ancillary Kit - 15 Day Supply (DME) (Generic) Every Other Day/15 Days Discharge Instructions: Use supplies as instructed; Kit contains: (15) Saline Bullets; (15) 3x3 Gauze; 15 pr Gloves Prim Dressing: Endoform 2x2 in (DME) (Generic) Every Other Day/15 Days ary Discharge Instructions: Moisten with saline Secondary Dressing: Woven Gauze Sponges 2x2 in (DME) (Generic) Every Other Day/15 Days Discharge Instructions: Apply over primary dressing as directed. Secondary Dressing: Optifoam Non-Adhesive Dressing, 4x4 in (DME) (Generic) Every Other Day/15 Days Discharge Instructions: ***foam donut***Apply over primary dressing as directed. Secured With: Child psychotherapist, Sterile 2x75 (in/in) (DME) (Generic) Every Other Day/15 Days Discharge Instructions: Secure with stretch gauze as directed. Secured With: Paper Tape, 1x10 (in/yd) (DME) (Generic) Every Other Day/15 Days Discharge Instructions: Secure dressing with tape as directed. Wound #2 - Elbow Wound Laterality: Right Cleanser: Soap and Water 1 x Per ZHY/86 Days Discharge Instructions: May shower and wash wound with dial antibacterial soap and  water prior to dressing change. Cleanser: Byram Ancillary Kit - 15 Day Supply (DME) (Generic) 1 x Per Day/15 Days Discharge Instructions: Use supplies as instructed; Kit contains: (15) Saline Bullets; (15) 3x3 Gauze; 15 pr Gloves Prim Dressing: KerraCel Ag Gelling Fiber Dressing, 2x2 in (silver alginate) (DME) (Generic) 1 x Per Day/15 Days ary Discharge Instructions: Apply silver alginate over Santyl, do not pack. Prim Dressing: Santyl Ointment 1 x Per Day/15 Days ary Discharge Instructions: Apply nickel thick amount to wound bed, apply silver alginate over Santyl Secondary Dressing: Bordered Gauze, 2x3.75 in (DME) (Generic) 1 x Per Day/15 Days Discharge Instructions: Apply over primary dressing as directed. Radiology X-ray, right elbow - Traumatic wound to right elbow Patient Medications llergies: Novocain, amoxicillin, metoprolol, procaine A Notifications Medication Indication Start End 01/16/2021 Santyl DOSE 1 - topical 250 unit/gram ointment - 1 ointment topical to wound daily Electronic Signature(s) Signed: 01/16/2021 5:44:20 PM By: [name] DO Signed: 01/16/2021 6:29:12 PM By: [name] RN, BSN Previous Signature: 01/16/2021 5:19:52 PM Version By: [name] DO Entered By: [name] on 01/16/2021 17:38:10 Prescription 01/16/2021 -------------------------------------------------------------------------------- Karl Stevenson, Karl Rinks C. [name] DO Patient Name: Provider: [birth year] [id number] Date of Birth: NPI#: Jerilynn Mages [id number] Sex: DEA #: [phone] Phone #: License #: [place] Patient Address: [address] [place], [place] [address] [place], [place] [address] [phone] Allergies Novocain; amoxicillin; metoprolol; procaine Provider's Orders X-ray, right elbow - Traumatic wound to right elbow Hand Signature: Date(s): Prescription 01/16/2021 Karl Stevenson, Karl C. [name] DO Patient Name: Provider: May 25, 1926 [id number] Date of Birth: NPI#: Jerilynn Mages [id number] Sex: DEA #: [phone] Phone #: License #: [place] Patient Address: [address] [place], [place] [address] [place], [place] [address] [phone] Allergies Novocain; amoxicillin; metoprolol; procaine Medication Medication: Route: Strength: Form: Santyl 250 unit/gram topical ointment topical 250 unit/gram ointment Class: TOPICAL/MUCOUS MEMBR./SUBCUT ENZYMES . Dose: Frequency / Time: Indication: 1 1 ointment topical to wound daily Number of Refills: Number of Units: 1 Thirty (30) Generic Substitution: Start Date: End Date: One Time Use: Substitution Permitted 0/07/5007 No Note to Pharmacy: Hand Signature: Date(s): Electronic Signature(s) Signed: 01/16/2021 5:44:20 PM By: [name] DO Signed: 01/16/2021 6:29:12 PM  By: [name] RN, BSN Previous Signature: 01/16/2021 5:19:52 PM Version By: [name] DO Entered By: [name] on 01/16/2021 17:38:10 -------------------------------------------------------------------------------- Problem List Details Patient Name: Date of Service: Karl Stevenson, Karl MES C. 01/16/2021 2:45 PM Medical Record Number: [id number] Patient Account Number: [id number] Date of Birth/Sex: Treating RN: 1926/12/28 (87 y.o. [name] Primary Care Provider: [name] Other Clinician: Referring Provider: Treating Provider/Extender: [name] in Treatment: 16 Active Problems ICD-10 Encounter Code Description Active Date MDM Diagnosis L97.519 Non-pressure chronic ulcer of other part of right foot with unspecified severity 09/26/2020 No Yes I73.9 Peripheral vascular disease, unspecified 09/26/2020 No Yes G90.09 Other idiopathic peripheral autonomic neuropathy 09/26/2020 No Yes W96.75 Chronic systolic (congestive) heart  failure 09/26/2020 No Yes I25.119 Atherosclerotic heart disease of native coronary artery with unspecified angina 09/26/2020 No Yes pectoris M86.171 Other acute osteomyelitis, right ankle and foot 10/14/2020 No Yes S51.001A Unspecified open wound of right elbow, initial encounter 01/16/2021 No Yes Inactive Problems Resolved Problems Electronic Signature(s) Signed: 01/16/2021 5:19:52 PM By: [name] DO Entered By: [name] on 01/16/2021 16:52:51 -------------------------------------------------------------------------------- Progress Note Details Patient Name: Date of Service: Karl Stevenson, Karl MES C. 01/16/2021 2:45 PM Medical Record Number: [id number] Patient Account Number: [id number] Date of Birth/Sex: Treating RN: 1927/02/02 (87 y.o. [name] Primary Care Provider: [name] Other Clinician: Referring Provider: Treating Provider/Extender: [name] in Treatment: 16 Subjective Chief Complaint Information obtained from Patient Right foot wound 9/8; right elbow wound History of Present Illness (HPI) [name] is a 87 year old male with a past medical history of peripheral vascular disease, coronary artery disease, chronic systolic heart failure, and peripheral neuropathy that presents to our clinic for a 15-monthhistory of right foot wound. He thinks that the wound started by Rubbing against his shoes. He states he was evaluated by his primary care physician who obtained an x-ray of the right foot. They were sent to vein and vascular for further assessment of the blood flow to the lower extremities. He had an aortogram on 09/05/2020 and had a right posterior tibial artery angioplasty. He states that the wound has been stable in appearance and size since the procedure. He has been using Bactroban and covering the area with a Band-Aid. He is using soft bedroom shoes. He denies acute signs of infection. 5/26; patient presents for 1  week follow-up. He has been using collagen to the wound with dressing changes. He has no complaints and denies acute signs of infection today. He is scheduled for an MRI on 6/2 6/6; patient presents for 1 week follow-up. He has been using collagen to the wound. He has noticed some increased redness to the wound site. He obtained his MRI. 6/13; this is a patient with a wound on the medial aspect of his right first metatarsal head. He had an MRI on 6/2 that suggested a wound along the medial aspect the first metatarsal head extending to bone with underlying osteomyelitis. Bone culture that was done did not show specific pathogens. The biopsies suggested chronic active inflammation and necrosis without malignancy identified presumably osteomyelitis. The patient saw Dr. MWest Balion 6/8. He is to started on daptomycin and ceftriaxone he is getting a PICC line tomorrow. He has been on doxycycline and Augmentin but apparently stopped these because of diarrhea. [facility] also [facility] for [facility] opinions. He is using silver collagen on the wound. The patient is already been revascularized in late April with a  right posterior tibial angioplasty 6/20; patient presents for 1 week follow-up. He has been on IV antibiotics for osteomyelitis for the past 1 to 2 weeks. He denies any issues with the infusions. He overall feels okay 12/11/2020 upon evaluation today patient's wound actually is showing signs of doing decently well in regard to the foot ulcer. I am actually very pleased with where things stand today. There is no signs of active infection at this time which is great news. No fevers, chills, nausea, vomiting, or diarrhea. 8/18; patient presents for 2-week follow-up. He has no issues or complaints today. He continues to use collagen to the wound bed. He reports improvement to the wound healing. He denies signs of infection. 9/8; patient presents for 2-week follow-up. He continues to use  collagen on the right medial foot wound. He has no issues or complaints about this today. He has a wound to his right elbow that has been nonhealing for the past 2 months. He has been keeping the area covered. Patient History Information obtained from Patient. Family History Cancer - Mother, Diabetes - Child, Heart Disease - Father, Lung Disease - Siblings, No family history of Hereditary Spherocytosis, Hypertension, Kidney Disease, Seizures, Stroke, Thyroid Problems, Tuberculosis. Social History Never smoker, Marital Status - Married, Alcohol Use - Rarely, Drug Use - No History, Caffeine Use - Moderate. Medical History Eyes Patient has history of Cataracts Respiratory Patient has history of Chronic Obstructive Pulmonary Disease (COPD) Cardiovascular Patient has history of Arrhythmia, Congestive Heart Failure, Coronary Artery Disease, Hypertension, Myocardial Infarction, Peripheral Venous Disease Gastrointestinal Patient has history of Crohnoos Medical A Surgical History Notes nd Cardiovascular cardiomyopathy, AAA repair Objective Constitutional respirations regular, non-labored and within target range for patient.. Vitals Time Taken: 3:46 PM, Height: 63 in, Weight: 136 lbs, BMI: 24.1, Temperature: 97.7 F, Pulse: 77 bpm, Respiratory Rate: 16 breaths/min, Blood Pressure: 126/63 mmHg. Cardiovascular 2+ dorsalis pedis/posterior tibialis pulses. Psychiatric pleasant and cooperative. General Notes: Right foot: T the medial aspect there is a small open wound with granulation tissue present. Surrounding skin is intact with no signs of o infection. Right elbow: Open wound with nonviable tissue and significant undermining Integumentary (Hair, Skin) Wound #1 status is Open. Original cause of wound was Gradually Appeared. The date acquired was: 07/23/2020. The wound has been in treatment 16 weeks. The wound is located on the Right,Medial Foot. The wound measures 0.6cm length x 0.4cm width  x 0.1cm depth; 0.188cm^2 area and 0.019cm^3 volume. There is Fat Layer (Subcutaneous Tissue) exposed. There is no tunneling or undermining noted. There is a medium amount of serosanguineous drainage noted. The wound margin is well defined and not attached to the wound base. There is large (67-100%) pink granulation within the wound bed. There is no necrotic tissue within the wound bed. Wound #2 status is Open. Original cause of wound was Trauma. The date acquired was: 11/11/2020. The wound is located on the Right Elbow. The wound measures 1cm length x 0.6cm width x 0.5cm depth; 0.471cm^2 area and 0.236cm^3 volume. There is Fat Layer (Subcutaneous Tissue) exposed. There is no tunneling noted, however, there is undermining starting at 6:00 and ending at 10:00 with a maximum distance of 3.8cm. There is a medium amount of serosanguineous drainage noted. The wound margin is well defined and not attached to the wound base. There is small (1-33%) pink granulation within the wound bed. There is a large (67-100%) amount of necrotic tissue within the wound bed including Eschar and Adherent Slough. Assessment Active Problems ICD-10 Non-pressure  chronic ulcer of other part of right foot with unspecified severity Peripheral vascular disease, unspecified Other idiopathic peripheral autonomic neuropathy Chronic systolic (congestive) heart failure Atherosclerotic heart disease of native coronary artery with unspecified angina pectoris Other acute osteomyelitis, right ankle and foot Unspecified open wound of right elbow, initial encounter Patient's right medial foot wound is stable. There is good granulation tissue present. I recommended changing the collagen to endoform every other day. No signs of infection on exam. Patient has a wound to his right elbow. This was mentioned for the first time today He states he fell in July and created a wound. At that time he was evaluated in the ED. He was noted to have a  laceration to his elbow and stitches were placed and removed 2 weeks later. He is not sure when the wound opened up again. At this time I recommended using silver alginate and Santyl. No obvious signs of infection on exam. I recommended an x-ray of the elbow to reassess any underlying issues. Procedures Wound #2 Pre-procedure diagnosis of Wound #2 is a Trauma, Other located on the Right Elbow . There was a Chemical/Enzymatic/Mechanical debridement performed by Deon Pilling, RN.[name] Agent used was [employer]. A time out was conducted at 16:00, prior to the start of the procedure. There was no bleeding. The procedure was tolerated well with a pain level of 0 throughout and a pain level of 0 following the procedure. Post Debridement Measurements: 1cm length x 0.6cm width x 0.5cm depth; 0.236cm^3 volume. Character of Wound/Ulcer Post Debridement requires further debridement. Post procedure Diagnosis Wound #2: Same as Pre-Procedure Plan Follow-up Appointments: Return Appointment in 1 week. - with Dr. Heber [place] Bathing/ Shower/ Hygiene: May shower and wash wound with soap and water. - with dressing changes only. Edema Control - Lymphedema / SCD / Other: Elevate legs to the level of the heart or above for 30 minutes daily and/or when sitting, a frequency of: - 3-4 times throughout the day. Avoid standing for long periods of time. Moisturize legs daily. - every night before bed. Off-Loading: Open toe surgical shoe to: - both feet wear while walking. May wear regular shoes or sandals as long as the shoe does not rub on the foot Radiology ordered were: X-ray, right elbow - Traumatic wound to right elbow The following medication(s) was prescribed: Santyl topical 250 unit/gram ointment 1 1 ointment topical to wound daily starting 01/16/2021 WOUND #1: - Foot Wound Laterality: Right, Medial Cleanser: Soap and Water Every Other Day/15 Days Discharge Instructions: May shower and wash wound with dial antibacterial  soap and water prior to dressing change. Cleanser: Byram Ancillary Kit - 15 Day Supply (DME) (Generic) Every Other Day/15 Days Discharge Instructions: Use supplies as instructed; Kit contains: (15) Saline Bullets; (15) 3x3 Gauze; 15 pr Gloves Prim Dressing: Endoform 2x2 in (DME) (Generic) Every Other Day/15 Days ary Discharge Instructions: Moisten with saline Secondary Dressing: Woven Gauze Sponges 2x2 in (DME) (Generic) Every Other Day/15 Days Discharge Instructions: Apply over primary dressing as directed. Secondary Dressing: Optifoam Non-Adhesive Dressing, 4x4 in (DME) (Generic) Every Other Day/15 Days Discharge Instructions: ***foam donut***Apply over primary dressing as directed. Secured With: Child psychotherapist, Sterile 2x75 (in/in) (DME) (Generic) Every Other Day/15 Days Discharge Instructions: Secure with stretch gauze as directed. Secured With: Paper T ape, 1x10 (in/yd) (DME) (Generic) Every Other Day/15 Days Discharge Instructions: Secure dressing with tape as directed. WOUND #2: - Elbow Wound Laterality: Right Cleanser: Soap and Water 1 x Per VVO/16 Days Discharge Instructions: May  shower and wash wound with dial antibacterial soap and water prior to dressing change. Cleanser: Byram Ancillary Kit - 15 Day Supply (DME) (Generic) 1 x Per Day/15 Days Discharge Instructions: Use supplies as instructed; Kit contains: (15) Saline Bullets; (15) 3x3 Gauze; 15 pr Gloves Prim Dressing: KerraCel Ag Gelling Fiber Dressing, 2x2 in (silver alginate) (DME) (Generic) 1 x Per Day/15 Days ary Discharge Instructions: Apply silver alginate over Santyl, do not pack. Prim Dressing: Santyl Ointment 1 x Per Day/15 Days ary Discharge Instructions: Apply nickel thick amount to wound bed, apply silver alginate over Santyl Secondary Dressing: Bordered Gauze, 2x3.75 in (DME) (Generic) 1 x Per Day/15 Days Discharge Instructions: Apply over primary dressing as directed. 1. T the right foot  endoform o 2. T the elbow use silver alginate and Santyl o 3. Follow-up in 1 week Electronic Signature(s) Signed: 01/16/2021 6:29:12 PM By: [name] RN, BSN Signed: 01/17/2021 9:12:05 AM By: [name] DO Previous Signature: 01/16/2021 5:19:52 PM Version By: [name] DO Entered By: [name] on 01/16/2021 18:20:37 -------------------------------------------------------------------------------- HxROS Details Patient Name: Date of Service: Karl Stevenson, Karl MES C. 01/16/2021 2:45 PM Medical Record Number: [id number] Patient Account Number: [id number] Date of Birth/Sex: Treating RN: 1927/04/20 (87 y.o. [name] Primary Care Provider: [name] Other Clinician: Referring Provider: Treating Provider/Extender: [name] in Treatment: 16 Information Obtained From Patient Eyes Medical History: Positive for: Cataracts Respiratory Medical History: Positive for: Chronic Obstructive Pulmonary Disease (COPD) Cardiovascular Medical History: Positive for: Arrhythmia; Congestive Heart Failure; Coronary Artery Disease; Hypertension; Myocardial Infarction; Peripheral Venous Disease Past Medical History Notes: cardiomyopathy, AAA repair Gastrointestinal Medical History: Positive for: Crohns HBO Extended History Items Eyes: Cataracts Immunizations Pneumococcal Vaccine: Received Pneumococcal Vaccination: Yes Received Pneumococcal Vaccination On or After 60th Birthday: No Implantable Devices None Family and Social History Cancer: Yes - Mother; Diabetes: Yes - Child; Heart Disease: Yes - Father; Hereditary Spherocytosis: No; Hypertension: No; Kidney Disease: No; Lung Disease: Yes - Siblings; Seizures: No; Stroke: No; Thyroid Problems: No; Tuberculosis: No; Never smoker; Marital Status - Married; Alcohol Use: Rarely; Drug Use: No History; Caffeine Use: Moderate; Financial Concerns: No; Food, Clothing or Shelter Needs: No; Support  System Lacking: No; Transportation Concerns: No Electronic Signature(s) Signed: 01/16/2021 5:19:52 PM By: [name] DO Signed: 01/16/2021 5:40:25 PM By: Deon Pilling RN, BSN Entered By: [name] on 01/16/2021 16:55:05 -------------------------------------------------------------------------------- [place] Details Patient Name: Date of Service: Karl Stevenson, Pavo MES C. 01/16/2021 Medical Record Number: [id number] Patient Account Number: [id number] Date of Birth/Sex: Treating RN: 09/04/1926 (87 y.o. 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 I73.9 Peripheral vascular disease, unspecified G90.09 Other idiopathic peripheral autonomic neuropathy U13.24 Chronic systolic (congestive) heart failure I25.119 Atherosclerotic heart disease of native coronary artery with unspecified angina pectoris M86.171 Other acute osteomyelitis, right ankle and foot S51.001A Unspecified open wound of right elbow, initial encounter Facility Procedures CPT4 Code: [id number] Description: [phone] - [place]BR[place] W/O ANES NON SELECT Modifier: Quantity: 1 Physician Procedures : CPT4 Code Description Modifier [id number] - WC PHYS LEVEL 3 - EST PT ICD-10 Diagnosis Description L97.519 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

## 2021-01-20 ENCOUNTER — Ambulatory Visit (HOSPITAL_COMMUNITY)
Admission: RE | Admit: 2021-01-20 | Discharge: 2021-01-20 | Disposition: A | Discharge status: Home / Self Care | Payer: Medicare PPO | Source: Ambulatory Visit | Attending: Internal Medicine | Admitting: Internal Medicine

## 2021-01-20 ENCOUNTER — Other Ambulatory Visit (HOSPITAL_COMMUNITY): Payer: Self-pay | Admitting: Internal Medicine

## 2021-01-20 ENCOUNTER — Other Ambulatory Visit: Payer: Self-pay

## 2021-01-20 DIAGNOSIS — S59901A Unspecified injury of right elbow, initial encounter: Secondary | ICD-10-CM | POA: Diagnosis not present

## 2021-01-20 DIAGNOSIS — S51011A Laceration without foreign body of right elbow, initial encounter: Secondary | ICD-10-CM | POA: Diagnosis not present

## 2021-01-21 DIAGNOSIS — G9009 Other idiopathic peripheral autonomic neuropathy: Secondary | ICD-10-CM | POA: Diagnosis not present

## 2021-01-21 DIAGNOSIS — I5022 Chronic systolic (congestive) heart failure: Secondary | ICD-10-CM | POA: Diagnosis not present

## 2021-01-21 DIAGNOSIS — S51009A Unspecified open wound of unspecified elbow, initial encounter: Secondary | ICD-10-CM | POA: Diagnosis not present

## 2021-01-21 DIAGNOSIS — I739 Peripheral vascular disease, unspecified: Secondary | ICD-10-CM | POA: Diagnosis not present

## 2021-01-21 DIAGNOSIS — L97519 Non-pressure chronic ulcer of other part of right foot with unspecified severity: Secondary | ICD-10-CM | POA: Diagnosis not present

## 2021-01-23 ENCOUNTER — Other Ambulatory Visit: Payer: Self-pay

## 2021-01-23 ENCOUNTER — Encounter (HOSPITAL_BASED_OUTPATIENT_CLINIC_OR_DEPARTMENT_OTHER): Payer: Medicare PPO | Admitting: Internal Medicine

## 2021-01-23 DIAGNOSIS — I110 Hypertensive heart disease with heart failure: Secondary | ICD-10-CM | POA: Diagnosis not present

## 2021-01-23 DIAGNOSIS — S51009A Unspecified open wound of unspecified elbow, initial encounter: Secondary | ICD-10-CM | POA: Diagnosis not present

## 2021-01-23 DIAGNOSIS — S51001D Unspecified open wound of right elbow, subsequent encounter: Secondary | ICD-10-CM

## 2021-01-23 DIAGNOSIS — M86171 Other acute osteomyelitis, right ankle and foot: Secondary | ICD-10-CM

## 2021-01-23 DIAGNOSIS — I739 Peripheral vascular disease, unspecified: Secondary | ICD-10-CM | POA: Diagnosis not present

## 2021-01-23 DIAGNOSIS — Z833 Family history of diabetes mellitus: Secondary | ICD-10-CM | POA: Diagnosis not present

## 2021-01-23 DIAGNOSIS — I5022 Chronic systolic (congestive) heart failure: Secondary | ICD-10-CM | POA: Diagnosis not present

## 2021-01-23 DIAGNOSIS — G9009 Other idiopathic peripheral autonomic neuropathy: Secondary | ICD-10-CM | POA: Diagnosis not present

## 2021-01-23 DIAGNOSIS — L97519 Non-pressure chronic ulcer of other part of right foot with unspecified severity: Secondary | ICD-10-CM

## 2021-01-23 DIAGNOSIS — S51001A Unspecified open wound of right elbow, initial encounter: Secondary | ICD-10-CM | POA: Diagnosis not present

## 2021-01-23 DIAGNOSIS — G629 Polyneuropathy, unspecified: Secondary | ICD-10-CM | POA: Diagnosis not present

## 2021-01-23 NOTE — Progress Notes (Signed)
Karl Stevenson ([id number]) Visit Report for 01/23/2021 Chief Complaint Document Details Patient Name: Date of Service: Karl Stevenson MES C. 01/23/2021 1:45 PM Medical Record Number: [id number] Patient Account Number: [id number] Date of Birth/Sex: Treating RN: Nov 09, 1926 (87 y.o. Karl Stevenson, Karl Stevenson Primary Care Provider: Leanna Stevenson Other Clinician: Referring Provider: Treating Provider/Extender: Karl Stevenson in Treatment: 17 Information Obtained from: Patient Chief Complaint Right foot wound 9/8; right elbow wound Electronic Signature(s) Signed: 01/23/2021 3:24:44 PM By: Karl Shan DO Entered By: Karl Stevenson on 01/23/2021 15:08:43 -------------------------------------------------------------------------------- HPI Details Patient Name: Date of Service: Karl Stevenson, Karl MES C. 01/23/2021 1:45 PM Medical Record Number: [id number] Patient Account Number: [id number] Date of Birth/Sex: Treating RN: [birth year] (87 y.o. Karl Stevenson, Karl Stevenson Primary Care Provider: Leanna Stevenson Other Clinician: Referring Provider: Treating Provider/Extender: Karl Stevenson in Treatment: 17 History of Present Illness HPI Description: [name] is a 87 year old male with a past medical history of peripheral vascular disease, coronary artery disease, chronic systolic heart failure, and peripheral neuropathy that presents to our clinic for a 62-monthhistory of right foot wound. He thinks that the wound started by Rubbing against his shoes. He states he was evaluated by his primary care physician who obtained an x-ray of the right foot. They were sent to vein and vascular for further assessment of the blood flow to the lower extremities. He had an aortogram on 09/05/2020 and had a right posterior tibial artery angioplasty. He states that the wound has been stable in appearance and size since the procedure. He has been using Bactroban and  covering the area with a Band-Aid. He is using soft bedroom shoes. He denies acute signs of infection. 5/26; patient presents for 1 week follow-up. He has been using collagen to the wound with dressing changes. He has no complaints and denies acute signs of infection today. He is scheduled for an MRI on 6/2 6/6; patient presents for 1 week follow-up. He has been using collagen to the wound. He has noticed some increased redness to the wound site. He obtained his MRI. 6/13; this is a patient with a wound on the medial aspect of his right first metatarsal head. He had an MRI on 6/2 that suggested a wound along the medial aspect the first metatarsal head extending to bone with underlying osteomyelitis. Bone culture that was done did not show specific pathogens. The biopsies suggested chronic active inflammation and necrosis without malignancy identified presumably osteomyelitis. The patient saw Dr. MWest Balion 6/8. He is to started on daptomycin and ceftriaxone he is getting a PICC line tomorrow. He has been on doxycycline and Augmentin but apparently stopped these because of diarrhea. [facility] also [facility] for [facility] opinions. He is using silver collagen on the wound. The patient is already been revascularized in late April with a right posterior tibial angioplasty 6/20; patient presents for 1 week follow-up. He has been on IV antibiotics for osteomyelitis for the past 1 to 2 weeks. He denies any issues with the infusions. He overall feels okay 12/11/2020 upon evaluation today patient's wound actually is showing signs of doing decently well in regard to the foot ulcer. I am actually very pleased with where things stand today. There is no signs of active infection at this time which is great news. No fevers, chills, nausea, vomiting, or diarrhea. 8/18; patient presents for 2-week follow-up. He has no issues or complaints today. He continues to use collagen to the wound bed.  He  reports improvement to the wound healing. He denies signs of infection. 9/8; patient presents for 2-week follow-up. He continues to use collagen on the right medial foot wound. He has no issues or complaints about this today. He has a wound to his right elbow that has been nonhealing for the past 2 months. He has been keeping the area covered. 9/15; patient presents for 1 week follow-up. He has been using endoform on the right medial foot wound. He states he noticed increased redness to the wound site 2 days ago. He is using silver alginate to the right elbow wound. He currently denies systemic signs of infection. Electronic Signature(s) Signed: 01/23/2021 3:24:44 PM By: Karl Shan DO Entered By: Karl Stevenson on 01/23/2021 15:10:33 -------------------------------------------------------------------------------- Physical Exam Details Patient Name: Date of Service: Karl Stevenson, Karl MES C. 01/23/2021 1:45 PM Medical Record Number: [id number] Patient Account Number: [id number] Date of Birth/Sex: Treating RN: 07-09-1926 (87 y.o. Karl Stevenson Primary Care Provider: Leanna Stevenson Other Clinician: Referring Provider: Treating Provider/Extender: Karl Stevenson in Treatment: 17 Constitutional respirations regular, non-labored and within target range for patient.[name] Psychiatric pleasant and cooperative. Notes Right foot: T the medial aspect there is an open wound with granulation tissue present with surrounding erythema. No drainage noted. o Right elbow open wound with non viable tissue and granulation tissue present with undermining. No obvious signs of infection to this area. Electronic Signature(s) Signed: 01/23/2021 3:24:44 PM By: Karl Shan DO Entered By: Karl Stevenson on 01/23/2021 15:13:30 -------------------------------------------------------------------------------- Physician Orders Details Patient Name: Date of Service: Karl Stevenson, Karl MES  C. 01/23/2021 1:45 PM Medical Record Number: [id number] Patient Account Number: [id number] Date of Birth/Sex: Treating RN: May 22, 1926 (87 y.o. Karl Stevenson Primary Care Provider: Leanna Stevenson Other Clinician: Referring Provider: Treating Provider/Extender: Karl Stevenson in Treatment: [phone] Verbal / Phone Orders: No Diagnosis Coding ICD-10 Coding Code Description L97.519 Non-pressure chronic ulcer of other part of right foot with unspecified severity I73.9 Peripheral vascular disease, unspecified G90.09 Other idiopathic peripheral autonomic neuropathy U27.25 Chronic systolic (congestive) heart failure I25.119 Atherosclerotic heart disease of native coronary artery with unspecified angina pectoris M86.171 Other acute osteomyelitis, right ankle and foot S51.001D Unspecified open wound of right elbow, subsequent encounter Follow-up Appointments ppointment in 1 week. - with Dr. Heber [place] Return A Other: - 01/23/21: Pick up antibiotic at pharmacy and take as directed. Bathing/ Shower/ Hygiene May shower and wash wound with soap and water. - with dressing changes only. Edema Control - Lymphedema / SCD / Other Elevate legs to the level of the heart or above for 30 minutes daily and/or when sitting, a frequency of: - 3-4 times throughout the day. Avoid standing for long periods of time. Moisturize legs daily. - every night before bed. Off-Loading Open toe surgical shoe to: - both feet, wear while walking. May wear regular shoes or sandals as long as the shoe does not rub on the foot Wound Treatment Wound #1 - Foot Wound Laterality: Right, Medial Cleanser: Soap and Water Every Other Day/15 Days Discharge Instructions: May shower and wash wound with dial antibacterial soap and water prior to dressing change. Cleanser: Byram Ancillary Kit - 15 Day Supply (Generic) Every Other Day/15 Days Discharge Instructions: Use supplies as instructed; Kit contains: (15) Saline  Bullets; (15) 3x3 Gauze; 15 pr Gloves Prim Dressing: Promogran Prisma Matrix, 4.34 (sq in) (silver collagen) Every Other Day/15 Days ary Discharge Instructions: Moisten collagen with saline or hydrogel Secondary Dressing: Woven Gauze Sponges 2x2 in (  Generic) Every Other Day/15 Days Discharge Instructions: Apply over primary dressing as directed. Secondary Dressing: Optifoam Non-Adhesive Dressing, 4x4 in (Generic) Every Other Day/15 Days Discharge Instructions: ***foam donut***Apply over primary dressing as directed. Secured With: Child psychotherapist, Sterile 2x75 (in/in) (Generic) Every Other Day/15 Days Discharge Instructions: Secure with stretch gauze as directed. Secured With: Paper Tape, 1x10 (in/yd) (Generic) Every Other Day/15 Days Discharge Instructions: Secure dressing with tape as directed. Wound #2 - Elbow Wound Laterality: Right Cleanser: Soap and Water 1 x Per ZOX/09 Days Discharge Instructions: May shower and wash wound with dial antibacterial soap and water prior to dressing change. Cleanser: Byram Ancillary Kit - 15 Day Supply (Generic) 1 x Per Day/15 Days Discharge Instructions: Use supplies as instructed; Kit contains: (15) Saline Bullets; (15) 3x3 Gauze; 15 pr Gloves Prim Dressing: KerraCel Ag Gelling Fiber Dressing, 2x2 in (silver alginate) (Generic) 1 x Per Day/15 Days ary Discharge Instructions: Apply silver alginate over Santyl, do not pack. Secondary Dressing: Bordered Gauze, 2x3.75 in (Generic) 1 x Per Day/15 Days Discharge Instructions: Apply over primary dressing as directed. Patient Medications llergies: Novocain, amoxicillin, metoprolol, procaine A Notifications Medication Indication Start End 01/23/2021 doxycycline hyclate DOSE 1 - oral 100 mg tablet - 1 tablet oral BID for 7 days Electronic Signature(s) Signed: 01/23/2021 3:24:44 PM By: Karl Shan DO Previous Signature: 01/23/2021 3:19:55 PM Version By: Karl Shan DO Entered By:  Karl Stevenson on 01/23/2021 15:20:23 -------------------------------------------------------------------------------- Problem List Details Patient Name: Date of Service: Karl Stevenson, Karl MES C. 01/23/2021 1:45 PM Medical Record Number: [id number] Patient Account Number: [id number] Date of Birth/Sex: Treating RN: 1926/08/27 (87 y.o. Karl Stevenson Primary Care Provider: Leanna Stevenson Other Clinician: Referring Provider: Treating Provider/Extender: Karl Stevenson in Treatment: 17 Active Problems ICD-10 Encounter Code Description Active Date MDM Diagnosis L97.519 Non-pressure chronic ulcer of other part of right foot with unspecified severity 09/26/2020 No Yes I73.9 Peripheral vascular disease, unspecified 09/26/2020 No Yes G90.09 Other idiopathic peripheral autonomic neuropathy 09/26/2020 No Yes X91.47 Chronic systolic (congestive) heart failure 09/26/2020 No Yes I25.119 Atherosclerotic heart disease of native coronary artery with unspecified angina 09/26/2020 No Yes pectoris M86.171 Other acute osteomyelitis, right ankle and foot 10/14/2020 No Yes S51.001D Unspecified open wound of right elbow, subsequent encounter 01/23/2021 No Yes Inactive Problems ICD-10 Code Description Active Date Inactive Date S51.001A Unspecified open wound of right elbow, initial encounter 01/16/2021 01/16/2021 Resolved Problems Electronic Signature(s) Signed: 01/23/2021 3:24:44 PM By: Karl Shan DO Entered By: Karl Stevenson on 01/23/2021 15:07:11 -------------------------------------------------------------------------------- Progress Note Details Patient Name: Date of Service: Karl Stevenson, Karl MES C. 01/23/2021 1:45 PM Medical Record Number: [id number] Patient Account Number: [id number] Date of Birth/Sex: Treating RN: December 12, 1926 (87 y.o. Karl Stevenson, Karl Stevenson Primary Care Provider: Leanna Stevenson Other Clinician: Referring Provider: Treating Provider/Extender: Karl Stevenson in Treatment: 17 Subjective Chief Complaint Information obtained from Patient Right foot wound 9/8; right elbow wound History of Present Illness (HPI) Mr. Garfield Coiner is a 87 year old male with a past medical history of peripheral vascular disease, coronary artery disease, chronic systolic heart failure, and peripheral neuropathy that presents to our clinic for a 83-monthhistory of right foot wound. He thinks that the wound started by Rubbing against his shoes. He states he was evaluated by his primary care physician who obtained an x-ray of the right foot. They were sent to vein and vascular for further assessment of the blood flow to the lower extremities. He had an aortogram on 09/05/2020 and had a  right posterior tibial artery angioplasty. He states that the wound has been stable in appearance and size since the procedure. He has been using Bactroban and covering the area with a Band-Aid. He is using soft bedroom shoes. He denies acute signs of infection. 5/26; patient presents for 1 week follow-up. He has been using collagen to the wound with dressing changes. He has no complaints and denies acute signs of infection today. He is scheduled for an MRI on 6/2 6/6; patient presents for 1 week follow-up. He has been using collagen to the wound. He has noticed some increased redness to the wound site. He obtained his MRI. 6/13; this is a patient with a wound on the medial aspect of his right first metatarsal head. He had an MRI on 6/2 that suggested a wound along the medial aspect the first metatarsal head extending to bone with underlying osteomyelitis. Bone culture that was done did not show specific pathogens. The biopsies suggested chronic active inflammation and necrosis without malignancy identified presumably osteomyelitis. The patient saw Dr. West Bali on 6/8. He is to started on daptomycin and ceftriaxone he is getting a PICC line tomorrow. He has been on  doxycycline and Augmentin but apparently stopped these because of diarrhea. [facility] also [facility] for [facility] opinions. He is using silver collagen on the wound. The patient is already been revascularized in late April with a right posterior tibial angioplasty 6/20; patient presents for 1 week follow-up. He has been on IV antibiotics for osteomyelitis for the past 1 to 2 weeks. He denies any issues with the infusions. He overall feels okay 12/11/2020 upon evaluation today patient's wound actually is showing signs of doing decently well in regard to the foot ulcer. I am actually very pleased with where things stand today. There is no signs of active infection at this time which is great news. No fevers, chills, nausea, vomiting, or diarrhea. 8/18; patient presents for 2-week follow-up. He has no issues or complaints today. He continues to use collagen to the wound bed. He reports improvement to the wound healing. He denies signs of infection. 9/8; patient presents for 2-week follow-up. He continues to use collagen on the right medial foot wound. He has no issues or complaints about this today. He has a wound to his right elbow that has been nonhealing for the past 2 months. He has been keeping the area covered. 9/15; patient presents for 1 week follow-up. He has been using endoform on the right medial foot wound. He states he noticed increased redness to the wound site 2 days ago. He is using silver alginate to the right elbow wound. He currently denies systemic signs of infection. Patient History Information obtained from Patient. Family History Cancer - Mother, Diabetes - Child, Heart Disease - Father, Lung Disease - Siblings, No family history of Hereditary Spherocytosis, Hypertension, Kidney Disease, Seizures, Stroke, Thyroid Problems, Tuberculosis. Social History Never smoker, Marital Status - Married, Alcohol Use - Rarely, Drug Use - No History, Caffeine Use -  Moderate. Medical History Eyes Patient has history of Cataracts Respiratory Patient has history of Chronic Obstructive Pulmonary Disease (COPD) Cardiovascular Patient has history of Arrhythmia, Congestive Heart Failure, Coronary Artery Disease, Hypertension, Myocardial Infarction, Peripheral Venous Disease Gastrointestinal Patient has history of Crohnoos Medical A Surgical History Notes nd Cardiovascular cardiomyopathy, AAA repair Objective Constitutional respirations regular, non-labored and within target range for patient.. Vitals Time Taken: 2:29 PM, Height: 63 in, Weight: 136 lbs, BMI: 24.1, Temperature: 97.6 F, Pulse:  73 bpm, Respiratory Rate: 16 breaths/min, Blood Pressure: 102/56 mmHg. Psychiatric pleasant and cooperative. General Notes: Right foot: T the medial aspect there is an open wound with granulation tissue present with surrounding erythema. No drainage noted. Right o elbow open wound with non viable tissue and granulation tissue present with undermining. No obvious signs of infection to this area. Integumentary (Hair, Skin) Wound #1 status is Open. Original cause of wound was Gradually Appeared. The date acquired was: 07/23/2020. The wound has been in treatment 17 weeks. The wound is located on the Right,Medial Foot. The wound measures 0.4cm length x 0.5cm width x 0.2cm depth; 0.157cm^2 area and 0.031cm^3 volume. There is Fat Layer (Subcutaneous Tissue) exposed. There is no tunneling or undermining noted. There is a medium amount of serosanguineous drainage noted. The wound margin is well defined and not attached to the wound base. There is large (67-100%) pink granulation within the wound bed. There is no necrotic tissue within the wound bed. General Notes: maceration noted Wound #2 status is Open. Original cause of wound was Trauma. The date acquired was: 11/11/2020. The wound has been in treatment 1 weeks. The wound is located on the Right Elbow. The wound measures  0.5cm length x 0.6cm width x 0.1cm depth; 0.236cm^2 area and 0.024cm^3 volume. There is Fat Layer (Subcutaneous Tissue) exposed. There is no tunneling noted, however, there is undermining starting at 7:00 and ending at 10:00 with a maximum distance of 2cm. There is a medium amount of serosanguineous drainage noted. The wound margin is well defined and not attached to the wound base. There is large (67- 100%) pink granulation within the wound bed. There is a small (1-33%) amount of necrotic tissue within the wound bed. Assessment Active Problems ICD-10 Non-pressure chronic ulcer of other part of right foot with unspecified severity Peripheral vascular disease, unspecified Other idiopathic peripheral autonomic neuropathy Chronic systolic (congestive) heart failure Atherosclerotic heart disease of native coronary artery with unspecified angina pectoris Other acute osteomyelitis, right ankle and foot Unspecified open wound of right elbow, subsequent encounter Patient was switched to endoform at last clinic visit. The wound looks slightly more macerated. There is increased erythema to the periwound. I will switch him back to silver collagen and give him a course of doxycycline to see if this will help with the symptoms. Also recommended offloading the area. For the elbow I recommended continuing Santyl and silver alginate. Follow-up in 1 week Plan Follow-up Appointments: Return Appointment in 1 week. - with Dr. Heber [place] Other: - 01/23/21: Pick up antibiotic at pharmacy and take as directed. Bathing/ Shower/ Hygiene: May shower and wash wound with soap and water. - with dressing changes only. Edema Control - Lymphedema / SCD / Other: Elevate legs to the level of the heart or above for 30 minutes daily and/or when sitting, a frequency of: - 3-4 times throughout the day. Avoid standing for long periods of time. Moisturize legs daily. - every night before bed. Off-Loading: Open toe surgical shoe  to: - both feet, wear while walking. May wear regular shoes or sandals as long as the shoe does not rub on the foot The following medication(s) was prescribed: doxycycline hyclate oral 100 mg tablet 1 1 tablet oral BID for 7 days starting 01/23/2021 WOUND #1: - Foot Wound Laterality: Right, Medial Cleanser: Soap and Water Every Other Day/15 Days Discharge Instructions: May shower and wash wound with dial antibacterial soap and water prior to dressing change. Cleanser: Byram Ancillary Kit - 15 Day Supply (Generic) Every  Other Day/15 Days Discharge Instructions: Use supplies as instructed; Kit contains: (15) Saline Bullets; (15) 3x3 Gauze; 15 pr Gloves Prim Dressing: Promogran Prisma Matrix, 4.34 (sq in) (silver collagen) Every Other Day/15 Days ary Discharge Instructions: Moisten collagen with saline or hydrogel Secondary Dressing: Woven Gauze Sponges 2x2 in (Generic) Every Other Day/15 Days Discharge Instructions: Apply over primary dressing as directed. Secondary Dressing: Optifoam Non-Adhesive Dressing, 4x4 in (Generic) Every Other Day/15 Days Discharge Instructions: ***foam donut***Apply over primary dressing as directed. Secured With: Child psychotherapist, Sterile 2x75 (in/in) (Generic) Every Other Day/15 Days Discharge Instructions: Secure with stretch gauze as directed. Secured With: Paper T ape, 1x10 (in/yd) (Generic) Every Other Day/15 Days Discharge Instructions: Secure dressing with tape as directed. WOUND #2: - Elbow Wound Laterality: Right Cleanser: Soap and Water 1 x Per PXT/06 Days Discharge Instructions: May shower and wash wound with dial antibacterial soap and water prior to dressing change. Cleanser: Byram Ancillary Kit - 15 Day Supply (Generic) 1 x Per Day/15 Days Discharge Instructions: Use supplies as instructed; Kit contains: (15) Saline Bullets; (15) 3x3 Gauze; 15 pr Gloves Prim Dressing: KerraCel Ag Gelling Fiber Dressing, 2x2 in (silver alginate) (Generic) 1  x Per Day/15 Days ary Discharge Instructions: Apply silver alginate over Santyl, do not pack. Secondary Dressing: Bordered Gauze, 2x3.75 in (Generic) 1 x Per Day/15 Days Discharge Instructions: Apply over primary dressing as directed. 1. Doxycycline 2. Silver collagen to the foot wound 3. Silver alginate to the elbow wound 4. Follow-up in 1 week Electronic Signature(s) Signed: 01/23/2021 3:24:44 PM By: Karl Shan DO Entered By: Karl Stevenson on 01/23/2021 15:23:13 -------------------------------------------------------------------------------- HxROS Details Patient Name: Date of Service: Karl Stevenson, Karl MES C. 01/23/2021 1:45 PM Medical Record Number: [id number] Patient Account Number: [id number] Date of Birth/Sex: Treating RN: 09-13-1926 (87 y.o. Karl Stevenson, Karl Stevenson Primary Care Provider: Leanna Stevenson Other Clinician: Referring Provider: Treating Provider/Extender: Karl Stevenson in Treatment: 17 Information Obtained From Patient Eyes Medical History: Positive for: Cataracts Respiratory Medical History: Positive for: Chronic Obstructive Pulmonary Disease (COPD) Cardiovascular Medical History: Positive for: Arrhythmia; Congestive Heart Failure; Coronary Artery Disease; Hypertension; Myocardial Infarction; Peripheral Venous Disease Past Medical History Notes: cardiomyopathy, AAA repair Gastrointestinal Medical History: Positive for: Crohns HBO Extended History Items Eyes: Cataracts Immunizations Pneumococcal Vaccine: Received Pneumococcal Vaccination: Yes Received Pneumococcal Vaccination On or After 60th Birthday: No Implantable Devices None Family and Social History Cancer: Yes - Mother; Diabetes: Yes - Child; Heart Disease: Yes - Father; Hereditary Spherocytosis: No; Hypertension: No; Kidney Disease: No; Lung Disease: Yes - Siblings; Seizures: No; Stroke: No; Thyroid Problems: No; Tuberculosis: No; Never smoker; Marital Status -  Married; Alcohol Use: Rarely; Drug Use: No History; Caffeine Use: Moderate; Financial Concerns: No; Food, Clothing or Shelter Needs: No; Support System Lacking: No; Transportation Concerns: No Electronic Signature(s) Signed: 01/23/2021 3:24:44 PM By: Karl Shan DO Signed: 01/23/2021 4:34:42 PM By: Rhae Hammock RN Entered By: Karl Stevenson on 01/23/2021 15:10:43 -------------------------------------------------------------------------------- SuperBill Details Patient Name: Date of Service: Karl Stevenson, Karl MES C. 01/23/2021 Medical Record Number: [id number] Patient Account Number: [id number] Date of Birth/Sex: Treating RN: 03/07/1927 (87 y.o. 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 M86.171 Other acute osteomyelitis, right ankle and foot S51.001D Unspecified open wound of right elbow, subsequent encounter Facility Procedures CPT4 Code: 30076226 Description: 99213 - WOUND CARE VISIT-LEV 3 EST PT Modifier: Quantity: 1 Physician Procedures : CPT4 Code Description Modifier [id number] 99213 - WC PHYS LEVEL 3 - EST PT ICD-10 Diagnosis Description 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

## 2021-01-27 NOTE — Progress Notes (Signed)
Karl Stevenson ([id number]) Visit Report for 01/23/2021 Arrival Information Details Patient Name: Date of Service: Karl Stevenson Karl C. 01/23/2021 1:45 PM Medical Record Number: [id number] Patient Account Number: [id number] Date of Birth/Sex: Treating RN: [birth year]venson (87 y.o. Karl Stevenson, Karl Stevenson Primary Care Karl Stevenson: Karl Stevenson Other Clinician: Referring Karl Stevenson: Treating Karl Stevenson/Extender: Karl Stevenson in Treatment: 82 Visit Information History Since Last Visit Added or deleted any medications: No Patient Arrived: Karl Stevenson Any new allergies or adverse reactions: No Arrival Time: 14:29 Had a fall or experienced change in No Accompanied By: son activities of daily living that may affect Transfer Assistance: None risk of falls: Patient Identification Verified: Yes Signs or symptoms of abuse/neglect since last visito No Secondary Verification Process Completed: Yes Hospitalized since last visit: No Patient Requires Transmission-Based Precautions: No Implantable device outside of the clinic excluding No Patient Has Alerts: Yes cellular tissue based products placed in the center Patient Alerts: Patient on Blood Thinner since last visit: ABI R=1.17 L=0.87 Has Dressing in Place as Prescribed: Yes TBI R=0.51 L=0.55 Pain Present Now: No Electronic Signature(s) Signed: 01/27/2021 10:46:33 AM By: Karl Stevenson Entered By: Karl Stevenson -------------------------------------------------------------------------------- Clinic Level of Care Assessment Details Patient Name: Date of Service: Karl Stevenson Karl C. 01/23/2021 1:45 PM Medical Record Number: [id number] Patient Account Number: [id number] Date of Birth/Sex: Treating RN: Karl Stevenson (87 y.o. Karl Stevenson Primary Care Karl Stevenson: Karl Stevenson Other Clinician: Referring Karl Stevenson: Treating Karl Stevenson/Extender: Karl Stevenson in Treatment:  17 Clinic Level of Care Assessment Items TOOL 4 Quantity Score X- 1 0 Use when only an EandM is performed on FOLLOW-UP visit ASSESSMENTS - Nursing Assessment / Reassessment X- 1 10 Reassessment of Co-morbidities (includes updates in patient status) X- 1 5 Reassessment of Adherence to Treatment Plan ASSESSMENTS - Wound and Skin A ssessment / Reassessment []  - 0 Simple Wound Assessment / Reassessment - one wound X- 2 5 Complex Wound Assessment / Reassessment - multiple wounds []  - 0 Dermatologic / Skin Assessment (not related to wound area) ASSESSMENTS - Focused Assessment []  - 0 Circumferential Edema Measurements - multi extremities []  - 0 Nutritional Assessment / Counseling / Intervention []  - 0 Lower Extremity Assessment (monofilament, tuning fork, pulses) []  - 0 Peripheral Arterial Disease Assessment (using hand held doppler) ASSESSMENTS - Ostomy and/or Continence Assessment and Care []  - 0 Incontinence Assessment and Management []  - 0 Ostomy Care Assessment and Management (repouching, etc.) PROCESS - Coordination of Care []  - 0 Simple Patient / Family Education for ongoing care X- 1 20 Complex (extensive) Patient / Family Education for ongoing care []  - 0 Staff obtains Programmer, systems, Records, T Results / Process Orders est []  - 0 Staff telephones HHA, Nursing Homes / Clarify orders / etc []  - 0 Routine Transfer to another Facility (non-emergent condition) []  - 0 Routine Hospital Admission (non-emergent condition) []  - 0 New Admissions / Biomedical engineer / Ordering NPWT Apligraf, etc. , []  - 0 Emergency Hospital Admission (emergent condition) []  - 0 Simple Discharge Coordination []  - 0 Complex (extensive) Discharge Coordination PROCESS - Special Needs []  - 0 Pediatric / Minor Patient Management []  - 0 Isolation Patient Management []  - 0 Hearing / Language / Visual special needs []  - 0 Assessment of Community assistance (transportation, D/C planning,  etc.) []  - 0 Additional assistance / Altered mentation []  - 0 Support Surface(s) Assessment (bed, cushion, seat, etc.) INTERVENTIONS - Wound Cleansing / Measurement []  - 0 Simple Wound Cleansing - one wound  X- 2 5 Complex Wound Cleansing - multiple wounds X- 1 5 Wound Imaging (photographs - any number of wounds) []  - 0 Wound Tracing (instead of photographs) []  - 0 Simple Wound Measurement - one wound X- 2 5 Complex Wound Measurement - multiple wounds INTERVENTIONS - Wound Dressings X - Small Wound Dressing one or multiple wounds 2 10 []  - 0 Medium Wound Dressing one or multiple wounds []  - 0 Large Wound Dressing one or multiple wounds []  - 0 Application of Medications - topical []  - 0 Application of Medications - injection INTERVENTIONS - Miscellaneous []  - 0 External ear exam []  - 0 Specimen Collection (cultures, biopsies, blood, body fluids, etc.) []  - 0 Specimen(s) / Culture(s) sent or taken to Lab for analysis []  - 0 Patient Transfer (multiple staff / Civil Service fast streamer / Similar devices) []  - 0 Simple Staple / Suture removal (25 or less) []  - 0 Complex Staple / Suture removal (26 or more) []  - 0 Hypo / Hyperglycemic Management (close monitor of Blood Glucose) []  - 0 Ankle / Brachial Index (ABI) - do not check if billed separately X- 1 5 Vital Signs Has the patient been seen at the hospital within the last three years: Yes Total Score: 95 Level Of Care: New/Established - Level 3 Electronic Signature(s) Signed: 01/23/2021 5:57:57 PM By: Karl Stevenson -------------------------------------------------------------------------------- Encounter Discharge Information Details Patient Name: Date of Service: Karl Stevenson, Karl Karl C. 01/23/2021 1:45 PM Medical Record Number: [id number] Patient Account Number: [id number] Date of Birth/Sex: Treating RN: [birth year] (87 y.o. Karl Stevenson Primary Care Karl Stevenson: Karl Stevenson  Other Clinician: Referring Karl Stevenson: Treating Karl Stevenson: Karl Stevenson in Treatment: 17 Encounter Discharge Information Items Discharge Condition: Stable Ambulatory Status: Walker Discharge Destination: Home Transportation: Private Auto Accompanied By: son Schedule Follow-up Appointment: Yes Clinical Summary of Care: Provided on 01/23/2021 Form Type Recipient Paper Patient Patient Electronic Signature(s) Signed: 01/23/2021 5:57:57 PM By: Karl Stevenson Entered By: Karl Stevenson on 01/23/2021 15:12:26 -------------------------------------------------------------------------------- Lower Extremity Assessment Details Patient Name: Date of Service: Karl Stevenson, Karl Karl C. 01/23/2021 1:45 PM Medical Record Number: [id number] Patient Account Number: [id number] Date of Birth/Sex: Treating RN: [birth year] (87 y.o. Karl Stevenson Primary Care Gared Gillie: Karl Stevenson Other Clinician: Referring Montanna Mcbain: Treating Muaaz Brau/Extender: Karl Stevenson in Treatment: 17 Edema Assessment Assessed: [name]: No] [name]: Yes] Edema: [Left: Ye] [Right: s] Calf Left: Right: Point of Measurement: 29 cm From Medial Instep 28 cm Ankle Left: Right: Point of Measurement: 8 cm From Medial Instep 20 cm Vascular Assessment Pulses: Dorsalis Pedis Palpable: [Right:Yes] Electronic Signature(s) Signed: 01/23/2021 5:57:57 PM By: Karl Stevenson Entered By: Karl Stevenson on 01/23/2021 14:32:54 -------------------------------------------------------------------------------- Multi Wound Chart Details Patient Name: Date of Service: Karl Stevenson, Karl Karl C. 01/23/2021 1:45 PM Medical Record Number: [id number] Patient Account Number: [id number] Date of Birth/Sex: Treating RN: [birth year]venson (87 y.o. Karl Stevenson, Karl Stevenson Primary Care Brinn Westby: Karl Stevenson Other Clinician: Referring Taleya Whitcher: Treating Ndidi Nesby/Extender: Karl Stevenson in Treatment: 17 Vital Signs Height(in): 15 Pulse(bpm): 20 Weight(lbs): 136 Blood Pressure(mmHg): 102/56 Body Mass Index(BMI): 24 Temperature(F): 97.6 Respiratory Rate(breaths/min): 16 Photos: [N/A:N/A] Right, Medial Foot Right Elbow N/A Wound Location: Gradually Appeared Trauma N/A Wounding Event: Neuropathic Ulcer-Non Diabetic Trauma, Other N/A Primary Etiology: Cataracts, Chronic Obstructive Cataracts, Chronic Obstructive N/A Comorbid History: Pulmonary Disease (COPD), Pulmonary Disease (COPD), Arrhythmia, Congestive Heart Failure, Arrhythmia, Congestive Heart Failure, Coronary Artery Disease, Coronary Artery Disease, Hypertension, Myocardial Infarction, Hypertension, Myocardial Infarction, Peripheral  Venous Disease, Crohns Peripheral Venous Disease, Crohns 07/23/2020 11/11/2020 N/A Date Acquired: 17 1 N/A Weeks of Treatment: Open Open N/A Wound Status: 0.4x0.5x0.2 0.5x0.6x0.1 N/A Measurements L x W x D (cm) 0.157 0.236 N/A A (cm) : rea 0.031 0.024 N/A Volume (cm) : 86.70% 49.90% N/A % Reduction in Area: 93.40% 89.80% N/A % Reduction in Volume: Full Thickness Without Exposed Full Thickness Without Exposed N/A Classification: Support Structures Support Structures Medium Medium N/A Exudate Amount: Serosanguineous Serosanguineous N/A Exudate Type: red, brown red, brown N/A Exudate Color: Well defined, not attached Well defined, not attached N/A Wound Margin: Large (67-100%) Large (67-100%) N/A Granulation Amount: Pink Pink N/A Granulation Quality: None Present (0%) Small (1-33%) N/A Necrotic Amount: Fat Layer (Subcutaneous Tissue): Yes Fat Layer (Subcutaneous Tissue): Yes N/A Exposed Structures: Fascia: No Fascia: No Tendon: No Tendon: No Muscle: No Muscle: No Joint: No Joint: No Bone: No Bone: No Medium (34-66%) Small (1-33%) N/A Epithelialization: maceration noted N/A N/A Assessment Notes: Treatment Notes Electronic  Signature(s) Signed: 01/23/2021 3:24:44 PM By: [name] DO Signed: 01/23/2021 4:34:42 PM By: Rhae Hammock RN Entered By: [name] on 01/23/2021 15:07:33 -------------------------------------------------------------------------------- Multi-Disciplinary Care Plan Details Patient Name: Date of Service: Karl Stevenson, Karl Karl C. 01/23/2021 1:45 PM Medical Record Number: [id number] Patient Account Number: [id number] Date of Birth/Sex: Treating RN: [birth year] (87 y.o. Karl Stevenson Primary Care Demetrion Wesby: Karl Stevenson Other Clinician: Referring Milena Liggett: Treating Charleston Hankin/Extender: Karl Stevenson in Treatment: 17 Active Inactive Wound/Skin Impairment Nursing Diagnoses: Knowledge deficit related to ulceration/compromised skin integrity Goals: Patient/caregiver will verbalize understanding of skin care regimen Date Initiated: 09/26/2020 Target Resolution Date: 2021/02/26 Goal Status: Active Interventions: Assess patient/caregiver ability to obtain necessary supplies Assess patient/caregiver ability to perform ulcer/skin care regimen upon admission and as needed Provide education on ulcer and skin care Treatment Activities: Skin care regimen initiated : 09/26/2020 Topical wound management initiated : 09/26/2020 Notes: Electronic Signature(s) Signed: 01/23/2021 5:57:57 PM By: Karl Stevenson Entered By: Karl Stevenson on 01/23/2021 14:35:53 -------------------------------------------------------------------------------- Pain Assessment Details Patient Name: Date of Service: Karl Stevenson, Karl Karl C. 01/23/2021 1:45 PM Medical Record Number: [id number] Patient Account Number: [id number] Date of Birth/Sex: Treating RN: [birth year] Stevenson (87 y.o. Karl Stevenson Primary Care Maraki Macquarrie: Karl Stevenson Other Clinician: Referring Evana Runnels: Treating Luise Yamamoto/Extender: Karl Stevenson in Treatment: 17 Active Problems Location of  Pain Severity and Description of Pain Patient Has Paino No Site Locations Pain Management and Medication Current Pain Management: Electronic Signature(s) Signed: 01/23/2021 5:57:57 PM By: Karl Stevenson Entered By: Karl Stevenson on 01/23/2021 14:32:34 -------------------------------------------------------------------------------- Patient/Caregiver Education Details Patient Name: Date of Service: Karl Stevenson Karl C. 9/15/2022andnbsp1:45 PM Medical Record Number: [id number] Patient Account Number: [id number] Date of Birth/Gender: Treating RN: [birth year]venson (87 y.o. Karl Stevenson Primary Care Physician: Karl Stevenson Other Clinician: Referring Physician: Treating Physician/Extender: Karl Stevenson in Treatment: 17 Education Assessment Education Provided To: Patient Education Topics Provided Wound/Skin Impairment: Methods: Explain/Verbal, Printed Responses: State content correctly [employer]) Signed: 01/23/2021 5:57:57 PM By: Karl Stevenson Entered By: Karl Stevenson on 01/23/2021 14:36:19 -------------------------------------------------------------------------------- Wound Assessment Details Patient Name: Date of Service: Karl Stevenson, Karl Karl C. 01/23/2021 1:45 PM Medical Record Number: [id number] Patient Account Number: [id number] Date of Birth/Sex: Treating RN: [birth year] Stevenson (87 y.o. Karl Stevenson Primary Care Skyla Champagne: Karl Stevenson Other Clinician: Referring Rosalea Withrow: Treating Rebecka Oelkers/Extender: Karl Stevenson in Treatment: 17 Wound Status Wound Number: 1 Primary Neuropathic Ulcer-Non Diabetic Etiology: Wound Location: Right, Medial Foot Wound Open Wounding Event: Gradually  Appeared Status: Date Acquired: 07/23/2020 Comorbid Cataracts, Chronic Obstructive Pulmonary Disease (COPD), Weeks Of Treatment: 17 History: Arrhythmia, Congestive Heart Failure, Coronary Artery Disease, Clustered Wound: No  Hypertension, Myocardial Infarction, Peripheral Venous Disease, Crohns Photos Wound Measurements Length: (cm) 0.4 Width: (cm) 0.5 Depth: (cm) 0.2 Area: (cm) 0.157 Volume: (cm) 0.031 % Reduction in Area: 86.7% % Reduction in Volume: 93.4% Epithelialization: Medium (34-66%) Tunneling: No Undermining: No Wound Description Classification: Full Thickness Without Exposed Support Structures Wound Margin: Well defined, not attached Exudate Amount: Medium Exudate Type: Serosanguineous Exudate Color: red, brown Foul Odor After Cleansing: No Slough/Fibrino No Wound Bed Granulation Amount: Large (67-100%) Exposed Structure Granulation Quality: Pink Fascia Exposed: No Necrotic Amount: None Present (0%) Fat Layer (Subcutaneous Tissue) Exposed: Yes Tendon Exposed: No Muscle Exposed: No Joint Exposed: No Bone Exposed: No Assessment Notes maceration noted Treatment Notes Wound #1 (Foot) Wound Laterality: Right, Medial Cleanser Soap and Water Discharge Instruction: May shower and wash wound with dial antibacterial soap and water prior to dressing change. Byram Ancillary Kit - 15 Day Supply Discharge Instruction: Use supplies as instructed; Kit contains: (15) Saline Bullets; (15) 3x3 Gauze; 15 pr Gloves Peri-Wound Care Topical Primary Dressing Promogran Prisma Matrix, 4.34 (sq in) (silver collagen) Discharge Instruction: Moisten collagen with saline or hydrogel Secondary Dressing Woven Gauze Sponges 2x2 in Discharge Instruction: Apply over primary dressing as directed. Optifoam Non-Adhesive Dressing, 4x4 in Discharge Instruction: ***foam donut***Apply over primary dressing as directed. Secured With Conforming Stretch Gauze Bandage, Sterile 2x75 (in/in) Discharge Instruction: Secure with stretch gauze as directed. Paper Tape, 1x10 (in/yd) Discharge Instruction: Secure dressing with tape as directed. Compression Wrap Compression Stockings Add-Ons Electronic  Signature(s) Signed: 01/23/2021 5:57:57 PM By: Karl Stevenson Entered By: Karl Stevenson on 01/23/2021 14:49:37 -------------------------------------------------------------------------------- Wound Assessment Details Patient Name: Date of Service: Karl Stevenson, Karl Karl C. 01/23/2021 1:45 PM Medical Record Number: [id number] Patient Account Number: [id number] Date of Birth/Sex: Treating RN: [birth year] Stevenson (87 y.o. Karl Stevenson Primary Care Shana Younge: Karl Stevenson Other Clinician: Referring Jaiven Graveline: Treating Hilario Robarts/Extender: Karl Stevenson in Treatment: 17 Wound Status Wound Number: 2 Primary Trauma, Other Etiology: Wound Location: Right Elbow Wound Open Wounding Event: Trauma Status: Date Acquired: 11/11/2020 Comorbid Cataracts, Chronic Obstructive Pulmonary Disease (COPD), Weeks Of Treatment: 1 History: Arrhythmia, Congestive Heart Failure, Coronary Artery Disease, Clustered Wound: No Hypertension, Myocardial Infarction, Peripheral Venous Disease, Crohns Photos Wound Measurements Length: (cm) 0.5 Width: (cm) 0.6 Depth: (cm) 0.1 Area: (cm) 0.236 Volume: (cm) 0.024 % Reduction in Area: 49.9% % Reduction in Volume: 89.8% Epithelialization: Small (1-33%) Tunneling: No Undermining: Yes Starting Position (o'clock): 7 Ending Position (o'clock): 10 Maximum Distance: (cm) 2 Wound Description Classification: Full Thickness Without Exposed Support Structures Wound Margin: Well defined, not attached Exudate Amount: Medium Exudate Type: Serosanguineous Exudate Color: red, brown Foul Odor After Cleansing: No Slough/Fibrino Yes Wound Bed Granulation Amount: Large (67-100%) Exposed Structure Granulation Quality: Pink Fascia Exposed: No Necrotic Amount: Small (1-33%) Fat Layer (Subcutaneous Tissue) Exposed: Yes Tendon Exposed: No Muscle Exposed: No Joint Exposed: No Bone Exposed: No Treatment Notes Wound #2 (Elbow) Wound Laterality:  Right Cleanser Soap and Water Discharge Instruction: May shower and wash wound with dial antibacterial soap and water prior to dressing change. Byram Ancillary Kit - 15 Day Supply Discharge Instruction: Use supplies as instructed; Kit contains: (15) Saline Bullets; (15) 3x3 Gauze; 15 pr Gloves Peri-Wound Care Topical Primary Dressing KerraCel Ag Gelling Fiber Dressing, 2x2 in (silver alginate) Discharge Instruction: Apply silver alginate over Santyl, do not pack. Secondary Dressing Bordered  Gauze, 2x3.75 in Discharge Instruction: Apply over primary dressing as directed. Secured With Compression Wrap Compression Stockings Environmental education officer) Signed: 01/23/2021 5:57:57 PM By: Karl Stevenson Entered By: Karl Stevenson on 01/23/2021 15:09:37 -------------------------------------------------------------------------------- Vitals Details Patient Name: Date of Service: Karl Stevenson, Karl Karl C. 01/23/2021 1:45 PM Medical Record Number: [id number] Patient Account Number: [id number] Date of Birth/Sex: Treating RN: Stevenson[birth year] (87 y.o. Karl Stevenson, Karl Stevenson Primary Care Toddrick Sanna: Karl Stevenson Other Clinician: Referring Darria Corvera: Treating Areesha Dehaven/Extender: Karl Stevenson in Treatment: 17 Vital Signs Time Taken: 14:29 Temperature (F): 97.6 Height (in): 63 Pulse (bpm): 73 Weight (lbs): 136 Respiratory Rate (breaths/min): 16 Body Mass Index (BMI): 24.1 Blood Pressure (mmHg): 102/56 Reference Range: 80 - 120 mg / dl Electronic Signature(s) Signed: 01/27/2021 10:46:33 AM By: Karl Stevenson Entered By: Karl Stevenson on 01/23/2021 14:29:48

## 2021-01-30 ENCOUNTER — Encounter (HOSPITAL_BASED_OUTPATIENT_CLINIC_OR_DEPARTMENT_OTHER): Payer: Medicare PPO | Admitting: Internal Medicine

## 2021-02-02 DIAGNOSIS — S51009A Unspecified open wound of unspecified elbow, initial encounter: Secondary | ICD-10-CM | POA: Diagnosis not present

## 2021-02-02 DIAGNOSIS — G9009 Other idiopathic peripheral autonomic neuropathy: Secondary | ICD-10-CM | POA: Diagnosis not present

## 2021-02-02 DIAGNOSIS — I5022 Chronic systolic (congestive) heart failure: Secondary | ICD-10-CM | POA: Diagnosis not present

## 2021-02-02 DIAGNOSIS — L97519 Non-pressure chronic ulcer of other part of right foot with unspecified severity: Secondary | ICD-10-CM | POA: Diagnosis not present

## 2021-02-02 DIAGNOSIS — I739 Peripheral vascular disease, unspecified: Secondary | ICD-10-CM | POA: Diagnosis not present

## 2021-02-05 ENCOUNTER — Ambulatory Visit: Payer: Medicare PPO | Admitting: Podiatry

## 2021-02-09 ENCOUNTER — Inpatient Hospital Stay (HOSPITAL_COMMUNITY): Payer: Medicare PPO

## 2021-02-09 ENCOUNTER — Emergency Department (HOSPITAL_COMMUNITY): Payer: Medicare PPO

## 2021-02-09 ENCOUNTER — Encounter (HOSPITAL_COMMUNITY): Payer: Self-pay

## 2021-02-09 ENCOUNTER — Inpatient Hospital Stay (HOSPITAL_COMMUNITY): Admission: EM | Disposition: E | Payer: Self-pay | Source: Home / Self Care | Attending: Cardiology

## 2021-02-09 ENCOUNTER — Inpatient Hospital Stay (HOSPITAL_COMMUNITY)
Admission: EM | Admit: 2021-02-09 | Discharge: 2021-03-11 | DRG: 242 | Disposition: E | Payer: Medicare PPO | Attending: Cardiology | Admitting: Cardiology

## 2021-02-09 DIAGNOSIS — I471 Supraventricular tachycardia: Secondary | ICD-10-CM | POA: Diagnosis not present

## 2021-02-09 DIAGNOSIS — I5043 Acute on chronic combined systolic (congestive) and diastolic (congestive) heart failure: Secondary | ICD-10-CM | POA: Diagnosis not present

## 2021-02-09 DIAGNOSIS — E039 Hypothyroidism, unspecified: Secondary | ICD-10-CM | POA: Diagnosis present

## 2021-02-09 DIAGNOSIS — Z825 Family history of asthma and other chronic lower respiratory diseases: Secondary | ICD-10-CM

## 2021-02-09 DIAGNOSIS — R55 Syncope and collapse: Secondary | ICD-10-CM

## 2021-02-09 DIAGNOSIS — I255 Ischemic cardiomyopathy: Secondary | ICD-10-CM | POA: Diagnosis not present

## 2021-02-09 DIAGNOSIS — E785 Hyperlipidemia, unspecified: Secondary | ICD-10-CM | POA: Diagnosis present

## 2021-02-09 DIAGNOSIS — Z20822 Contact with and (suspected) exposure to covid-19: Secondary | ICD-10-CM | POA: Diagnosis not present

## 2021-02-09 DIAGNOSIS — E872 Acidosis, unspecified: Secondary | ICD-10-CM | POA: Diagnosis not present

## 2021-02-09 DIAGNOSIS — Z79899 Other long term (current) drug therapy: Secondary | ICD-10-CM

## 2021-02-09 DIAGNOSIS — R570 Cardiogenic shock: Secondary | ICD-10-CM | POA: Diagnosis not present

## 2021-02-09 DIAGNOSIS — I130 Hypertensive heart and chronic kidney disease with heart failure and stage 1 through stage 4 chronic kidney disease, or unspecified chronic kidney disease: Secondary | ICD-10-CM | POA: Diagnosis present

## 2021-02-09 DIAGNOSIS — Z950 Presence of cardiac pacemaker: Secondary | ICD-10-CM | POA: Diagnosis not present

## 2021-02-09 DIAGNOSIS — I5023 Acute on chronic systolic (congestive) heart failure: Secondary | ICD-10-CM | POA: Diagnosis not present

## 2021-02-09 DIAGNOSIS — I420 Dilated cardiomyopathy: Secondary | ICD-10-CM | POA: Diagnosis present

## 2021-02-09 DIAGNOSIS — I441 Atrioventricular block, second degree: Secondary | ICD-10-CM | POA: Diagnosis not present

## 2021-02-09 DIAGNOSIS — T8111XA Postprocedural  cardiogenic shock, initial encounter: Secondary | ICD-10-CM | POA: Diagnosis not present

## 2021-02-09 DIAGNOSIS — I252 Old myocardial infarction: Secondary | ICD-10-CM

## 2021-02-09 DIAGNOSIS — I442 Atrioventricular block, complete: Secondary | ICD-10-CM | POA: Diagnosis not present

## 2021-02-09 DIAGNOSIS — N1831 Chronic kidney disease, stage 3a: Secondary | ICD-10-CM | POA: Diagnosis present

## 2021-02-09 DIAGNOSIS — F419 Anxiety disorder, unspecified: Secondary | ICD-10-CM | POA: Diagnosis present

## 2021-02-09 DIAGNOSIS — I959 Hypotension, unspecified: Secondary | ICD-10-CM | POA: Diagnosis not present

## 2021-02-09 DIAGNOSIS — Z6823 Body mass index (BMI) 23.0-23.9, adult: Secondary | ICD-10-CM

## 2021-02-09 DIAGNOSIS — R296 Repeated falls: Secondary | ICD-10-CM | POA: Diagnosis present

## 2021-02-09 DIAGNOSIS — I214 Non-ST elevation (NSTEMI) myocardial infarction: Secondary | ICD-10-CM | POA: Diagnosis not present

## 2021-02-09 DIAGNOSIS — K7200 Acute and subacute hepatic failure without coma: Secondary | ICD-10-CM | POA: Diagnosis not present

## 2021-02-09 DIAGNOSIS — I2583 Coronary atherosclerosis due to lipid rich plaque: Secondary | ICD-10-CM

## 2021-02-09 DIAGNOSIS — Z7189 Other specified counseling: Secondary | ICD-10-CM | POA: Diagnosis not present

## 2021-02-09 DIAGNOSIS — I499 Cardiac arrhythmia, unspecified: Secondary | ICD-10-CM | POA: Diagnosis not present

## 2021-02-09 DIAGNOSIS — I701 Atherosclerosis of renal artery: Secondary | ICD-10-CM | POA: Diagnosis present

## 2021-02-09 DIAGNOSIS — I472 Ventricular tachycardia, unspecified: Secondary | ICD-10-CM | POA: Diagnosis present

## 2021-02-09 DIAGNOSIS — S91301A Unspecified open wound, right foot, initial encounter: Secondary | ICD-10-CM | POA: Diagnosis present

## 2021-02-09 DIAGNOSIS — K5090 Crohn's disease, unspecified, without complications: Secondary | ICD-10-CM | POA: Diagnosis present

## 2021-02-09 DIAGNOSIS — R579 Shock, unspecified: Secondary | ICD-10-CM | POA: Diagnosis not present

## 2021-02-09 DIAGNOSIS — N179 Acute kidney failure, unspecified: Secondary | ICD-10-CM | POA: Diagnosis present

## 2021-02-09 DIAGNOSIS — Z66 Do not resuscitate: Secondary | ICD-10-CM | POA: Diagnosis not present

## 2021-02-09 DIAGNOSIS — R001 Bradycardia, unspecified: Secondary | ICD-10-CM | POA: Diagnosis present

## 2021-02-09 DIAGNOSIS — I5042 Chronic combined systolic (congestive) and diastolic (congestive) heart failure: Secondary | ICD-10-CM | POA: Diagnosis not present

## 2021-02-09 DIAGNOSIS — Z880 Allergy status to penicillin: Secondary | ICD-10-CM

## 2021-02-09 DIAGNOSIS — Z9049 Acquired absence of other specified parts of digestive tract: Secondary | ICD-10-CM

## 2021-02-09 DIAGNOSIS — R627 Adult failure to thrive: Secondary | ICD-10-CM | POA: Diagnosis present

## 2021-02-09 DIAGNOSIS — I2510 Atherosclerotic heart disease of native coronary artery without angina pectoris: Secondary | ICD-10-CM | POA: Diagnosis not present

## 2021-02-09 DIAGNOSIS — Z515 Encounter for palliative care: Secondary | ICD-10-CM | POA: Diagnosis not present

## 2021-02-09 DIAGNOSIS — Y831 Surgical operation with implant of artificial internal device as the cause of abnormal reaction of the patient, or of later complication, without mention of misadventure at the time of the procedure: Secondary | ICD-10-CM | POA: Diagnosis not present

## 2021-02-09 DIAGNOSIS — R0902 Hypoxemia: Secondary | ICD-10-CM | POA: Diagnosis not present

## 2021-02-09 DIAGNOSIS — J449 Chronic obstructive pulmonary disease, unspecified: Secondary | ICD-10-CM | POA: Diagnosis present

## 2021-02-09 DIAGNOSIS — Z951 Presence of aortocoronary bypass graft: Secondary | ICD-10-CM

## 2021-02-09 DIAGNOSIS — Z7982 Long term (current) use of aspirin: Secondary | ICD-10-CM

## 2021-02-09 DIAGNOSIS — R4020 Unspecified coma: Secondary | ICD-10-CM | POA: Diagnosis not present

## 2021-02-09 DIAGNOSIS — R000 Tachycardia, unspecified: Secondary | ICD-10-CM | POA: Diagnosis not present

## 2021-02-09 DIAGNOSIS — R42 Dizziness and giddiness: Secondary | ICD-10-CM | POA: Diagnosis not present

## 2021-02-09 DIAGNOSIS — E7800 Pure hypercholesterolemia, unspecified: Secondary | ICD-10-CM

## 2021-02-09 DIAGNOSIS — R52 Pain, unspecified: Secondary | ICD-10-CM | POA: Diagnosis not present

## 2021-02-09 DIAGNOSIS — J90 Pleural effusion, not elsewhere classified: Secondary | ICD-10-CM | POA: Diagnosis not present

## 2021-02-09 DIAGNOSIS — I5022 Chronic systolic (congestive) heart failure: Secondary | ICD-10-CM | POA: Diagnosis not present

## 2021-02-09 DIAGNOSIS — Z8249 Family history of ischemic heart disease and other diseases of the circulatory system: Secondary | ICD-10-CM

## 2021-02-09 DIAGNOSIS — R531 Weakness: Secondary | ICD-10-CM | POA: Diagnosis not present

## 2021-02-09 DIAGNOSIS — M109 Gout, unspecified: Secondary | ICD-10-CM | POA: Diagnosis present

## 2021-02-09 DIAGNOSIS — Z888 Allergy status to other drugs, medicaments and biological substances status: Secondary | ICD-10-CM

## 2021-02-09 DIAGNOSIS — I70221 Atherosclerosis of native arteries of extremities with rest pain, right leg: Secondary | ICD-10-CM | POA: Diagnosis present

## 2021-02-09 DIAGNOSIS — R9431 Abnormal electrocardiogram [ECG] [EKG]: Secondary | ICD-10-CM | POA: Diagnosis not present

## 2021-02-09 DIAGNOSIS — R0609 Other forms of dyspnea: Secondary | ICD-10-CM | POA: Diagnosis not present

## 2021-02-09 HISTORY — PX: TEMPORARY PACEMAKER: CATH118268

## 2021-02-09 LAB — I-STAT CHEM 8, ED
BUN: 71 mg/dL — ABNORMAL HIGH (ref 8–23)
Calcium, Ion: 1.03 mmol/L — ABNORMAL LOW (ref 1.15–1.40)
Chloride: 105 mmol/L (ref 98–111)
Creatinine, Ser: 1.8 mg/dL — ABNORMAL HIGH (ref 0.61–1.24)
Glucose, Bld: 140 mg/dL — ABNORMAL HIGH (ref 70–99)
HCT: 34 % — ABNORMAL LOW (ref 39.0–52.0)
Hemoglobin: 11.6 g/dL — ABNORMAL LOW (ref 13.0–17.0)
Potassium: 3.9 mmol/L (ref 3.5–5.1)
Sodium: 138 mmol/L (ref 135–145)
TCO2: 21 mmol/L — ABNORMAL LOW (ref 22–32)

## 2021-02-09 LAB — MRSA NEXT GEN BY PCR, NASAL: MRSA by PCR Next Gen: NOT DETECTED

## 2021-02-09 LAB — CBC
HCT: 32.8 % — ABNORMAL LOW (ref 39.0–52.0)
Hemoglobin: 10.7 g/dL — ABNORMAL LOW (ref 13.0–17.0)
MCH: 35.4 pg — ABNORMAL HIGH (ref 26.0–34.0)
MCHC: 32.6 g/dL (ref 30.0–36.0)
MCV: 108.6 fL — ABNORMAL HIGH (ref 80.0–100.0)
Platelets: 144 10*3/uL — ABNORMAL LOW (ref 150–400)
RBC: 3.02 MIL/uL — ABNORMAL LOW (ref 4.22–5.81)
RDW: 13.2 % (ref 11.5–15.5)
WBC: 11.1 10*3/uL — ABNORMAL HIGH (ref 4.0–10.5)
nRBC: 0 % (ref 0.0–0.2)

## 2021-02-09 LAB — BASIC METABOLIC PANEL
Anion gap: 13 (ref 5–15)
BUN: 63 mg/dL — ABNORMAL HIGH (ref 8–23)
CO2: 17 mmol/L — ABNORMAL LOW (ref 22–32)
Calcium: 8.4 mg/dL — ABNORMAL LOW (ref 8.9–10.3)
Chloride: 107 mmol/L (ref 98–111)
Creatinine, Ser: 1.88 mg/dL — ABNORMAL HIGH (ref 0.61–1.24)
GFR, Estimated: 33 mL/min — ABNORMAL LOW (ref >60)
Glucose, Bld: 143 mg/dL — ABNORMAL HIGH (ref 70–99)
Potassium: 3.9 mmol/L (ref 3.5–5.1)
Sodium: 137 mmol/L (ref 135–145)

## 2021-02-09 LAB — RESP PANEL BY RT-PCR (FLU A&B, COVID) ARPGX2
Influenza A by PCR: NEGATIVE
Influenza B by PCR: NEGATIVE
SARS Coronavirus 2 by RT PCR: NEGATIVE

## 2021-02-09 LAB — GLUCOSE, CAPILLARY: Glucose-Capillary: 97 mg/dL (ref 70–99)

## 2021-02-09 LAB — TROPONIN I (HIGH SENSITIVITY)
Troponin I (High Sensitivity): 10562 ng/L (ref <18)
Troponin I (High Sensitivity): 13111 ng/L (ref <18)

## 2021-02-09 LAB — TSH: TSH: 3.859 u[IU]/mL (ref 0.350–4.500)

## 2021-02-09 LAB — MAGNESIUM: Magnesium: 1.3 mg/dL — ABNORMAL LOW (ref 1.7–2.4)

## 2021-02-09 SURGERY — TEMPORARY PACEMAKER
Anesthesia: LOCAL

## 2021-02-09 MED ORDER — POLYVINYL ALCOHOL 1.4 % OP SOLN
1.0000 [drp] | Freq: Two times a day (BID) | OPHTHALMIC | Status: DC
  Administered 2021-02-09 – 2021-02-12 (×7): 1 [drp] via OPHTHALMIC
  Filled 2021-02-09 (×2): qty 15

## 2021-02-09 MED ORDER — SODIUM CHLORIDE 0.9 % IV SOLN: INTRAVENOUS | Status: DC

## 2021-02-09 MED ORDER — VITAMIN B-12 1000 MCG PO TABS
2000.0000 ug | ORAL_TABLET | Freq: Every day | ORAL | Status: DC
  Administered 2021-02-10 – 2021-02-12 (×3): 2000 ug via ORAL
  Filled 2021-02-09 (×3): qty 2

## 2021-02-09 MED ORDER — SODIUM CHLORIDE 0.9% FLUSH
10.0000 mL | Freq: Two times a day (BID) | INTRAVENOUS | Status: DC
  Administered 2021-02-10: 10 mL
  Administered 2021-02-10: 20 mL
  Administered 2021-02-11 – 2021-02-13 (×2): 10 mL

## 2021-02-09 MED ORDER — MAGNESIUM OXIDE -MG SUPPLEMENT 400 (240 MG) MG PO TABS
400.0000 mg | ORAL_TABLET | ORAL | Status: AC
Start: 2021-02-09 — End: 2021-02-09
  Administered 2021-02-09 (×2): 400 mg via ORAL
  Filled 2021-02-09 (×2): qty 1

## 2021-02-09 MED ORDER — SODIUM CHLORIDE 0.9 % IV SOLN
250.0000 mL | INTRAVENOUS | Status: DC | PRN
  Administered 2021-02-10: 500 mL via INTRAVENOUS

## 2021-02-09 MED ORDER — POLYETHYLENE GLYCOL 3350 17 G PO PACK
17.0000 g | PACK | Freq: Every day | ORAL | Status: DC
  Filled 2021-02-09: qty 1

## 2021-02-09 MED ORDER — ACETAMINOPHEN 500 MG PO TABS: 1000.0000 mg | ORAL_TABLET | Freq: Four times a day (QID) | ORAL | Status: DC | PRN

## 2021-02-09 MED ORDER — CLOPIDOGREL BISULFATE 75 MG PO TABS: 75.0000 mg | ORAL_TABLET | Freq: Every day | ORAL | Status: DC

## 2021-02-09 MED ORDER — MAGNESIUM OXIDE -MG SUPPLEMENT 400 (240 MG) MG PO TABS: 400.0000 mg | ORAL_TABLET | ORAL | Status: DC

## 2021-02-09 MED ORDER — MUPIROCIN 2 % EX OINT
1.0000 "application " | TOPICAL_OINTMENT | Freq: Two times a day (BID) | CUTANEOUS | Status: DC
  Administered 2021-02-10 – 2021-02-12 (×4): 1 via TOPICAL
  Filled 2021-02-09 (×2): qty 22

## 2021-02-09 MED ORDER — VITAMIN D 25 MCG (1000 UNIT) PO TABS
1000.0000 [IU] | ORAL_TABLET | Freq: Every day | ORAL | Status: DC
  Administered 2021-02-10 – 2021-02-12 (×3): 1000 [IU] via ORAL
  Filled 2021-02-09 (×3): qty 1

## 2021-02-09 MED ORDER — ORAL CARE MOUTH RINSE
15.0000 mL | Freq: Two times a day (BID) | OROMUCOSAL | Status: DC
  Administered 2021-02-11 – 2021-02-13 (×4): 15 mL via OROMUCOSAL

## 2021-02-09 MED ORDER — CHLORHEXIDINE GLUCONATE CLOTH 2 % EX PADS: 6.0000 | MEDICATED_PAD | Freq: Every day | CUTANEOUS | Status: DC

## 2021-02-09 MED ORDER — TRIAMCINOLONE ACETONIDE 0.1 % EX OINT
1.0000 "application " | TOPICAL_OINTMENT | Freq: Two times a day (BID) | CUTANEOUS | Status: DC
  Administered 2021-02-10 – 2021-02-12 (×4): 1 via TOPICAL
  Filled 2021-02-09 (×2): qty 15

## 2021-02-09 MED ORDER — SODIUM CHLORIDE 0.9% FLUSH
3.0000 mL | Freq: Two times a day (BID) | INTRAVENOUS | Status: DC
  Administered 2021-02-09 – 2021-02-12 (×4): 3 mL via INTRAVENOUS

## 2021-02-09 MED ORDER — CARBOXYMETHYLCELLULOSE SODIUM 0.5 % OP SOLN: 1.0000 [drp] | Freq: Four times a day (QID) | OPHTHALMIC | Status: DC

## 2021-02-09 MED ORDER — PANTOPRAZOLE SODIUM 40 MG PO TBEC
40.0000 mg | DELAYED_RELEASE_TABLET | Freq: Every day | ORAL | Status: DC
  Administered 2021-02-10 – 2021-02-12 (×3): 40 mg via ORAL
  Filled 2021-02-09 (×3): qty 1

## 2021-02-09 MED ORDER — MAGNESIUM SULFATE 2 GM/50ML IV SOLN
2.0000 g | INTRAVENOUS | Status: AC
  Administered 2021-02-09: 2 g via INTRAVENOUS
  Filled 2021-02-09: qty 50

## 2021-02-09 MED ORDER — SODIUM CHLORIDE 0.9% FLUSH: 10.0000 mL | INTRAVENOUS | Status: DC | PRN

## 2021-02-09 MED ORDER — SODIUM CHLORIDE 0.9% FLUSH: 3.0000 mL | INTRAVENOUS | Status: DC | PRN

## 2021-02-09 MED ORDER — ONDANSETRON HCL 4 MG/2ML IJ SOLN
INTRAMUSCULAR | Status: AC
  Administered 2021-02-09: 4 mg via INTRAVENOUS
  Filled 2021-02-09: qty 2

## 2021-02-09 MED ORDER — HEPARIN (PORCINE) IN NACL 1000-0.9 UT/500ML-% IV SOLN
INTRAVENOUS | Status: DC | PRN
  Administered 2021-02-09: 500 mL

## 2021-02-09 MED ORDER — ONDANSETRON HCL 4 MG/2ML IJ SOLN: 4.0000 mg | Freq: Once | INTRAMUSCULAR | Status: AC

## 2021-02-09 MED ORDER — HEPARIN (PORCINE) 25000 UT/250ML-% IV SOLN
750.0000 [IU]/h | INTRAVENOUS | Status: DC
  Administered 2021-02-09: 750 [IU]/h via INTRAVENOUS
  Filled 2021-02-09: qty 250

## 2021-02-09 MED ORDER — IPRATROPIUM BROMIDE 0.03 % NA SOLN: 1.0000 | Freq: Every day | NASAL | Status: DC

## 2021-02-09 MED ORDER — ASPIRIN EC 81 MG PO TBEC
81.0000 mg | DELAYED_RELEASE_TABLET | Freq: Every day | ORAL | Status: DC
  Administered 2021-02-11 – 2021-02-12 (×2): 81 mg via ORAL
  Filled 2021-02-09 (×2): qty 1

## 2021-02-09 MED ORDER — ONDANSETRON HCL 4 MG/2ML IJ SOLN
4.0000 mg | Freq: Four times a day (QID) | INTRAMUSCULAR | Status: DC | PRN
  Administered 2021-02-09: 4 mg via INTRAVENOUS
  Filled 2021-02-09: qty 2

## 2021-02-09 MED ORDER — LORATADINE 10 MG PO TABS
10.0000 mg | ORAL_TABLET | Freq: Every day | ORAL | Status: DC
  Administered 2021-02-10 – 2021-02-12 (×3): 10 mg via ORAL
  Filled 2021-02-09 (×3): qty 1

## 2021-02-09 MED ORDER — LIDOCAINE HCL (PF) 1 % IJ SOLN
INTRAMUSCULAR | Status: AC
  Filled 2021-02-09: qty 30

## 2021-02-09 MED ORDER — ACETAMINOPHEN 325 MG PO TABS: 650.0000 mg | ORAL_TABLET | ORAL | Status: DC | PRN

## 2021-02-09 MED ORDER — CALCIUM GLUCONATE-NACL 2-0.675 GM/100ML-% IV SOLN
2.0000 g | Freq: Once | INTRAVENOUS | Status: AC
  Administered 2021-02-09: 2000 mg via INTRAVENOUS
  Filled 2021-02-09: qty 100

## 2021-02-09 MED ORDER — HEPARIN (PORCINE) IN NACL 1000-0.9 UT/500ML-% IV SOLN
INTRAVENOUS | Status: AC
  Filled 2021-02-09: qty 1000

## 2021-02-09 MED ORDER — ATORVASTATIN CALCIUM 10 MG PO TABS
10.0000 mg | ORAL_TABLET | Freq: Every day | ORAL | Status: DC
  Administered 2021-02-10 – 2021-02-12 (×3): 10 mg via ORAL
  Filled 2021-02-09 (×3): qty 1

## 2021-02-09 MED ORDER — POLYETHYL GLYCOL-PROPYL GLYCOL 0.4-0.3 % OP SOLN: 1.0000 [drp] | Freq: Two times a day (BID) | OPHTHALMIC | Status: DC

## 2021-02-09 SURGICAL SUPPLY — 11 items
CABLE ADAPT PACING TEMP 12FT (ADAPTER) ×1 IMPLANT
CATH S G BIP PACING (CATHETERS) ×1 IMPLANT
KIT HEART LEFT (KITS) ×2 IMPLANT
PACK CARDIAC CATHETERIZATION (CUSTOM PROCEDURE TRAY) ×2 IMPLANT
PROTECTION STATION PRESSURIZED (MISCELLANEOUS) ×2
SET INTRODUCER MICROPUNCT 5F (INTRODUCER) ×1 IMPLANT
SHEATH PINNACLE 6F 10CM (SHEATH) ×1 IMPLANT
SLEEVE REPOSITIONING LENGTH 30 (MISCELLANEOUS) ×1 IMPLANT
STATION PROTECTION PRESSURIZED (MISCELLANEOUS) IMPLANT
TRANSDUCER W/STOPCOCK (MISCELLANEOUS) ×2 IMPLANT
TUBING CIL FLEX 10 FLL-RA (TUBING) ×2 IMPLANT

## 2021-02-09 NOTE — Progress Notes (Addendum)
Basic admission orders written per discussion with Dr. Mayford Knife -holding antihypertensives, Ranexa, mexiletine. She has reviewed elevation in troponin and per her discussion with Dr. [place] they will be deferring LHC for now. Will be getting temp wire momentarily. Msg sent to [facility] inbasket to have EP see in AM as well.

## 2021-02-09 NOTE — Consult Note (Addendum)
ELECTROPHYSIOLOGY CONSULT NOTE    Patient ID: Karl Stevenson MRN: [id number], DOB/AGE: [birth year] 85 y.o.  Admit date: 03/06/2021 Date of Consult: 02/16/2021  Primary Physician: Jarome Matin, MD Primary Cardiologist: [name], MD  Electrophysiologist: Dr. Graciela Husbands (Distantly, last visit 2018, [place])  Referring Provider: Dr. Mayford Knife  Patient Profile: Karl Stevenson is a 87 y.o. male with a history of complicated cardiac history including AAA, bradycardia (previously intolerant to BB and now off), Carotid artery disese s/p LCEA, Ischemic DCM with chronic systolic CHF (EF 34-28% on echo 2017), ASCAD s/p remote MI in1969 with CABG 1987 and redo 2008 and last cath 02/2015 with distal LM occlusion, occluded oLAD, occluded oLCx and OME with patent SVG to OM3>OM4, patent SVG to LAD and occluded SVG to OM2 on medical management.  He also has a hx of HTN, HLD, PVD with RAS, chronic RBBB and ischemic VT (initially on Amio and changed to Mexilitene and Ranexa. He is being seen today for the evaluation of Syncope and Advanced AV block at the request of Dr. Mayford Knife.  HPI:  Karl Stevenson is a 87 y.o. male with complicated medical history as above.    He was last seen by Dr. Excell Seltzer 12/02/2020 and was doing well except for several mechanical falls with a clavicular fx.  He has braces on his feet due to osteomyelitis.  He presented to [facility] with multiple dizzy spells and near syncope for the past several weeks. Called EMS. While walking to the ambulance he became dizzy then unresponsive and was found to have HR in the 30s. Was given fentanyl for pain with transcutaneous pacing.  In ER he appearing to bne in NSR when pacing turned down, but then had recurrent non-responsiveness and syncope with intermittent CHB and at least 1 episode of asystole. Pt was taken urgently for temp pacer.   No recent changes to his meds. Pt denies any CP, SOB, PND, orthopnea, or edema. Pt denies syncope prior to these  episodes.  He states he currently has no "infection in his bones" in his foot. He finished a 5 week course of IV ABX.   Last visit to wound clinic 01/23/21. His wound has healed to the point that he may shower and wash the wound with dial antibacterial soap. He is NOT currently on ABX.    Past Medical History:  Diagnosis Date   AAA (abdominal aortic aneurysm)    a. Ectatic abdominal aorta with fusiform dilitation 3.4x3.4 and moderate thrombus burden 12/2010   Bradycardia    a. previous intolerance to beta blockers - low dose toprol started 10/2011 for tachycardia but had rash and was discontinued;  b. tolerating low dose propranolol.   Carotid artery occlusion    a. s/p L CEA in 2010;  b. 06/2014 Carotid U/S: stable LICA CEA site w/o significant stenosis bilat.   Chronic systolic heart failure (HCC)    a. 02/2015 Echo:  35-40% (echo 11/2010); c. 02/2015 Echo: EF 20-25%, diff HK, mid-apicalanteroseptal and apical AK, Gr 1 DD, mild MR.   COPD (chronic obstructive pulmonary disease) (HCC)    Coronary artery disease    a. 1969 s/p MI;  b. S/P CABG 1987;  c. S/P redo 2008;  d. 07/2012 Cath: stable->Med Rx, EF 40%; e. 02/2014 MV: anterior and posterolateral/apical infarct with mild peri-infarct ischemia->Med Rx; f. 02/2015 Cath: LM 100d, LAD 100ost, LCX 100ost, OM2 100, VG->OM3->OM4 nl, VG-> LAD nl (Y from VG->OM), VG->OM2 100p.   Crohn's disease (HCC)  a. s/p colon resection   Gout    Hyperlipidemia    Hypertension    Ischemic cardiomyopathy    a. EF 34% (Lexiscan 06/2011); b. 35-40% (echo 11/2010); c. 02/2015 Echo: EF 20-25%, diff HK, mid-apicalanteroseptal and apical AK, Gr 1 DD, mild MR.   Peripheral vascular disease (HCC)    a. Carotid dz s/p L CEA, RAS, AAA, LE dz   RBBB    Renal artery stenosis (HCC)    a. Dopplers 12/2010 - 1-59% R ostial renal artery stenosis, 2 L renal arteries both widely patent   Syncope    Tachycardia    a. 10/2011 - atrial tach vs avnrt - BB started.   Ventricular  tachycardia    a. 02/2015 ->amio 200 daily added-->later switched to mexilitene.     Surgical History:  Past Surgical History:  Procedure Laterality Date   ABDOMINAL AORTAGRAM N/A 06/11/2011   Procedure: ABDOMINAL AORTAGRAM;  Surgeon: [name], MD;  Location: [facility] CATH LAB;  Service: Cardiovascular;  Laterality: N/A;   ABDOMINAL AORTOGRAM W/LOWER EXTREMITY N/A 09/05/2020   Procedure: ABDOMINAL AORTOGRAM W/LOWER EXTREMITY;  Surgeon: [name], MD;  Location: [facility] INVASIVE CV LAB;  Service: Cardiovascular;  Laterality: N/A;   CARDIAC CATHETERIZATION  07/28/2012   Native 3v CAD, continued patency of the SVG-OM1-LAD and SVG-OM2-left PDA. LVEF 40% with multiple WMAs   CARDIAC CATHETERIZATION N/A 03/08/2015   Procedure: Left Heart Cath and Coronary Angiography;  Surgeon: [name], MD;  Location: [facility] INVASIVE CV LAB;  Service: Cardiovascular;  Laterality: N/A;   CAROTID ENDARTERECTOMY Left 2010   CATARACT EXTRACTION, BILATERAL     COLON RESECTION  2008   COLON SURGERY     CORONARY ANGIOPLASTY     CORONARY ARTERY BYPASS GRAFT  10/31/85 & 08/19/06   ENDARTERECTOMY  08/26/2011   Procedure: ENDARTERECTOMY ILIAC;  Surgeon: [name], MD;  Location: [facility] OR;  Service: Vascular;  Laterality: Right;  Right Femoral Artery Endarterectomy with vascu guard patch angioplasty & intraoperative arteriogram.   EYE SURGERY     FEMORAL ENDARTERECTOMY Left  06/2011   ileofemoral endarterectomy with bovine patch angioplasty   IR FLUORO GUIDE CV LINE RIGHT  10/22/2020   IR US GUIDE VASC ACCESS RIGHT  10/22/2020   LEFT HEART CATHETERIZATION WITH CORONARY ANGIOGRAM N/A 07/28/2012   Procedure: LEFT HEART CATHETERIZATION WITH CORONARY ANGIOGRAM;  Surgeon: [name], MD;  Location: [facility] CATH LAB;  Service: Cardiovascular;  Laterality: N/A;   PERIPHERAL VASCULAR BALLOON ANGIOPLASTY Right 09/05/2020   Procedure: PERIPHERAL VASCULAR BALLOON ANGIOPLASTY;  Surgeon: [name], MD;  Location: [facility] INVASIVE  CV LAB;  Service: Cardiovascular;  Laterality: Right;  Posterior Tibial   PR VEIN BYPASS GRAFT,AORTO-FEM-POP  10/1985   TONSILLECTOMY     as a child     Medications Prior to Admission  Medication Sig Dispense Refill Last Dose   acetaminophen (TYLENOL) 500 MG tablet Take 1,000 mg by mouth every 6 (six) hours as needed (pain).    02/19/2021   aspirin EC 81 MG tablet Take 81 mg by mouth daily.   2021/02/19   atorvastatin (LIPITOR) 10 MG tablet Take 10 mg by mouth daily.   2021-02-19   carboxymethylcellulose (REFRESH PLUS) 0.5 % SOLN Place 1 drop into both eyes 4 (four) times daily.   2021-02-19   cholecalciferol (VITAMIN D3) 25 MCG (1000 UNIT) tablet Take 1,000 Units by mouth daily.   February 19, 2021   clopidogrel (PLAVIX) 75 MG tablet TAKE 1 TABLET EVERY DAY (Patient  taking differently: Take 75 mg by mouth daily.) 90 tablet 3 2021-02-25   fexofenadine (ALLEGRA) 180 MG tablet Take 180 mg by mouth daily.   2021-02-25   furosemide (LASIX) 40 MG tablet Take 20 mg by mouth every Monday, Wednesday, and Friday.   Past Week   ipratropium (ATROVENT) 0.03 % nasal spray Place 1 spray into both nostrils daily.   2021/02/25   isosorbide mononitrate (IMDUR) 120 MG 24 hr tablet Take 1 tablet (120 mg total) by mouth daily. 90 tablet 3 2021/02/25   isosorbide mononitrate (IMDUR) 60 MG 24 hr tablet Take 1 tablet (60 mg total) by mouth daily. 90 tablet 3 02-25-21   losartan (COZAAR) 25 MG tablet TAKE 1 TABLET TWICE DAILY (Patient taking differently: Take 25 mg by mouth in the morning and at bedtime.) 180 tablet 2 2021/02/25   mexiletine (MEXITIL) 200 MG capsule TAKE 1 CAPSULE (200 MG TOTAL) BY MOUTH EVERY 12 HOURS. (Patient taking differently: Take 200 mg by mouth every 12 (twelve) hours.) 180 capsule 1 02-25-2021   Multiple Vitamin (MULTIVITAMIN WITH MINERALS) TABS tablet Take 2 tablets by mouth daily.   2021/02/25   mupirocin ointment (BACTROBAN) 2 % Apply 1 application topically 2 (two) times daily. Apply to foot   02/25/21    nitroGLYCERIN (NITROSTAT) 0.4 MG SL tablet Place 1 tablet (0.4 mg total) under the tongue every 5 (five) minutes as needed for chest pain. 25 tablet 3 02-25-21   OVER THE COUNTER MEDICATION Take 1 capsule by mouth daily. Medication: Nerve Health   25-Feb-2021   pantoprazole (PROTONIX) 40 MG tablet TAKE 1 TABLET EVERY DAY (Patient taking differently: Take 40 mg by mouth daily.) 90 tablet 3 02/25/2021   Polyethyl Glycol-Propyl Glycol (SYSTANE) 0.4-0.3 % SOLN Place 1 drop into both eyes in the morning and at bedtime.   02/08/2021   polyethylene glycol powder (GLYCOLAX/MIRALAX) powder Take 17 g by mouth at bedtime. Mix in 8 oz liquid and drink   02/08/2021   ranolazine (RANEXA) 500 MG 12 hr tablet TAKE 2 TABLETS TWICE DAILY (Patient taking differently: Take 500 mg by mouth 2 (two) times daily.) 360 tablet 3 02/25/2021   spironolactone (ALDACTONE) 25 MG tablet Take 12.5 mg by mouth See admin instructions. Take one tablet by mouth on Monday, Wednesday and Fridays.   Past Week   triamcinolone ointment (KENALOG) 0.1 % Apply 1 application topically 2 (two) times daily. Mix with Mupirocin and apply to head   2021-02-25   vitamin B-12 (CYANOCOBALAMIN) 1000 MCG tablet Take 2,000 mcg by mouth daily.   02-25-21    Inpatient Medications:   [START ON 02/25/2021] aspirin EC  81 mg Oral Daily   [START ON 02/21/2021] atorvastatin  10 mg Oral Daily   [START ON 02/13/2021] cholecalciferol  1,000 Units Oral Daily   [START ON 02/20/2021] clopidogrel  75 mg Oral Daily   [START ON 02/15/2021] loratadine  10 mg Oral Daily   magnesium oxide  400 mg Oral Q4H   [START ON 03/01/2021] mupirocin ointment  1 application Topical BID   [START ON 02/26/2021] pantoprazole  40 mg Oral Daily   polyethylene glycol  17 g Oral QHS   polyvinyl alcohol  1 drop Both Eyes BID   sodium chloride flush  3 mL Intravenous Q12H   [START ON 02/17/2021] triamcinolone ointment  1 application Topical BID   [START ON 03/02/2021] vitamin B-12  2,000 mcg Oral  Daily    Allergies:  Allergies  Allergen Reactions   Amoxicillin Diarrhea  Novocain [Procaine Hcl] Other (See Comments)    Cold sweats and very bad headache   Procaine Other (See Comments)    Sweating headache   Metoprolol Hives, Itching and Rash    Social History   Socioeconomic History   Marital status: Married    Spouse name: Not on file   Number of children: Not on file   Years of education: Not on file   Highest education level: Not on file  Occupational History   Not on file  Tobacco Use   Smoking status: Never   Smokeless tobacco: Never  Vaping Use   Vaping Use: Never used  Substance and Sexual Activity   Alcohol use: No   Drug use: No   Sexual activity: Not on file  Other Topics Concern   Not on file  Social History Narrative   Not on file   Social Determinants of Health   Financial Resource Strain: Not on file  Food Insecurity: Not on file  Transportation Needs: Not on file  Physical Activity: Not on file  Stress: Not on file  Social Connections: Not on file  Intimate Partner Violence: Not on file     Family History  Problem Relation Age of Onset   Heart disease Father    Cancer Mother    COPD Mother    Deep vein thrombosis Brother    COPD Brother    CAD Brother    Anesthesia problems Neg Hx      Review of Systems: All other systems reviewed and are otherwise negative except as noted above.  Physical Exam: Vitals:   02/08/2021 1726 02/21/2021 1736 02/24/2021 1900 03/10/2021 2000  BP: 107/73 112/71 101/74 112/81  Pulse: (!) 105 (!) 59 (!) 114 (!) 117  Resp: 20 17 20  (!) 22  Temp:   97.6 F (36.4 C)   TempSrc:   Oral   SpO2: 100% 98% 100% 100%  Weight:      Height:        GEN- The patient is well appearing, alert and oriented x 3 today.   HEENT: normocephalic, atraumatic; sclera clear, conjunctiva pink; hearing intact; oropharynx clear; neck supple Lungs- Clear to ausculation bilaterally, normal work of breathing.  No wheezes, rales,  rhonchi Heart- Regular rate and rhythm, no murmurs, rubs or gallops GI- soft, non-tender, non-distended, bowel sounds present Extremities- no clubbing, cyanosis, or edema; DP/PT/radial pulses 2+ bilaterally MS- no significant deformity or atrophy Skin- warm and dry, no rash or lesion Psych- euthymic mood, full affect Neuro- strength and sensation are intact  Labs:   Lab Results  Component Value Date   WBC 11.1 (H) 02/19/2021   HGB 11.6 (L) 02/19/2021   HCT 34.0 (L) 02/26/2021   MCV 108.6 (H) 02/18/2021   PLT 144 (L) 02/08/2021    Recent Labs  Lab 03/09/2021 1500 02/21/2021 1536  NA 137 138  K 3.9 3.9  CL 107 105  CO2 17*  --   BUN 63* 71*  CREATININE 1.88* 1.80*  CALCIUM 8.4*  --   GLUCOSE 143* 140*      Radiology/Studies: DG ELBOW COMPLETE RIGHT (3+VIEW)  Result Date: 01/21/2021 CLINICAL DATA:  Elbow injury. Nonhealing laceration of right elbow for 2 months. History of peripheral vascular EXAM: RIGHT ELBOW - COMPLETE 3+ VIEW COMPARISON:  None. FINDINGS: There is no evidence of fracture, dislocation, or joint effusion. There is no evidence of arthropathy or other focal bone abnormality. Soft tissues are unremarkable. IMPRESSION: Negative. Electronically Signed   By: 01/23/2021  Tessie Fass M.D.   On: 01/21/2021 00:02   CARDIAC CATHETERIZATION  Result Date: 03/01/2021 Successful placement of temporary transvenous pacemaker via right internal jugular vein.   DG Chest Portable 1 View  Result Date: 03/07/2021 CLINICAL DATA:  Syncope. EXAM: PORTABLE CHEST 1 VIEW COMPARISON:  11/11/2020 FINDINGS: Sternotomy wires unchanged. Lungs are adequately inflated demonstrate hazy opacification of the perihilar vessels likely mild vascular congestion. No significant effusion. Cardiomediastinal silhouette and remainder of the exam is unchanged. IMPRESSION: Mild vascular congestion. Electronically Signed   By: Elberta Fortis M.D.   On: 02/13/2021 16:04    EKG: on admission shows 2nd degree AV block  with intermittent dropped beats at 109 bpm (personally reviewed)  Baseline EKG 11/11/2020 shows at least bifascicular block with PR interval > 220 and QRS > 170 in RBBB/LAFB at HR 83 bpm  TELEMETRY: Overnight has been sinus tach 100-110s with intermittent pacing. Reviewed yesterday with asystole and intermittent CHB (personally reviewed)   Assessment/Plan: 1.  Advanced AV block with Syncope Review of tele shows intermittent 2nd degree AV block, CHB, and asystole.  Explained risks, benefits, and alternatives to PPM implantation, including but not limited to bleeding, infection, pneumothorax, pericardial effusion, lead dislodgement, heart attack, stroke, or death.  Pt verbalized understanding and agrees to proceed if indicated.   I briefly discussed the process of leadless pacemaker, but worry with his significant h/o of PAD this approach could potentially carry high risk of complication or inability to proceed. Will discuss with MD.   2. ICM Echo 11/2015 LVEF 30-35%, Repeat pending LHC 02/2015 with severe native CAD with no PCI targets Hold Plavix Stop heparin  3. H/o VT On mexitil and ranolazine May be able to consider low dose BB post pacing  4. PAD Hx of AAA with thrombus Hx of LCEA with 1-39% B/L stenosis 09/2020 Mild LLE arterial disease by doplers 09/2020  5. Chronic foot wound Treated for osteomyelitis as recently as July.   Pt will likely require pacing. Will discuss with MDs best timing and approach. I will hold his plavix and his heparin in the event we are able to proceed.   For questions or updates, please contact [facility] Please consult [email or web address] for contact info under Cardiology/STEMI.  Dustin Flock, PA-C  03/02/2021 8:30 PM  EP Attending  Patient seen and examined. Agree with above. The patient has had a h/o VT and has significant conduction system disease and presents with symptomatic brady as well as long runs of NSVT. The VT morphology  and his significant conduction system disease strongly suggests bundle branch reentrant VT as the mechanism. However his advanced age and frail state make him a poor candidate for VT ablation or ICD insertion. I think amiodarone and left bundle area pacing a very reasonable alternative. In addition he has a healing ulcer on his foot which increases his risk for both. Because of long pauses he has a temporary PM in place.   Sharlot Gowda Makayli Bracken,MD

## 2021-02-09 NOTE — ED Notes (Signed)
Cardiologist at bedside- (Dr. Mayford Knife)

## 2021-02-09 NOTE — ED Provider Notes (Signed)
[facility] EMERGENCY DEPARTMENT Provider Note   CSN: [id number] Arrival date & time: 2021/03/07  1449     History Chief complaint syncope  Karl Stevenson is a 87 y.o. male.  HPI  Patient has been having intermittent episodes of dizziness and near syncope for the last couple of weeks.  Symptoms had been progressing so his wife convinced him to call EMS.  Patient was alert and oriented and was able to walk to the ambulance.  However while in the ambulance he had a bradycardic episode and became unresponsive.  Paramedics witnessed a heart rate in the 30s.  He had a wide left bundle branch block.  Patient improved after pacing.  He was given 100 mcg of fentanyl.  Patient right now states he is feeling fine.  He is not having much discomfort with the pacing.  He denies any recent fevers or chills.  No vomiting.  Past Medical History:  Diagnosis Date   AAA (abdominal aortic aneurysm)    a. Ectatic abdominal aorta with fusiform dilitation 3.4x3.4 and moderate thrombus burden 12/2010   Bradycardia    a. previous intolerance to beta blockers - low dose toprol started 10/2011 for tachycardia but had rash and was discontinued;  b. tolerating low dose propranolol.   Carotid artery occlusion    a. s/p L CEA in 2010;  b. 06/2014 Carotid U/S: stable LICA CEA site w/o significant stenosis bilat.   Chronic systolic heart failure (HCC)    a. 02/2015 Echo:  35-40% (echo 11/2010); c. 02/2015 Echo: EF 20-25%, diff HK, mid-apicalanteroseptal and apical AK, Gr 1 DD, mild MR.   COPD (chronic obstructive pulmonary disease) (HCC)    Coronary artery disease    a. 1969 s/p MI;  b. S/P CABG 1987;  c. S/P redo 2008;  d. 07/2012 Cath: stable->Med Rx, EF 40%; e. 02/2014 MV: anterior and posterolateral/apical infarct with mild peri-infarct ischemia->Med Rx; f. 02/2015 Cath: LM 100d, LAD 100ost, LCX 100ost, OM2 100, VG->OM3->OM4 nl, VG-> LAD nl (Y from VG->OM), VG->OM2 100p.   Crohn's disease (HCC)    a.  s/p colon resection   Gout    Hyperlipidemia    Hypertension    Ischemic cardiomyopathy    a. EF 34% (Lexiscan 06/2011); b. 35-40% (echo 11/2010); c. 02/2015 Echo: EF 20-25%, diff HK, mid-apicalanteroseptal and apical AK, Gr 1 DD, mild MR.   Peripheral vascular disease (HCC)    a. Carotid dz s/p L CEA, RAS, AAA, LE dz   RBBB    Renal artery stenosis (HCC)    a. Dopplers 12/2010 - 1-59% R ostial renal artery stenosis, 2 L renal arteries both widely patent   Syncope    Tachycardia    a. 10/2011 - atrial tach vs avnrt - BB started.   Ventricular tachycardia    a. 02/2015 ->amio 200 daily added-->later switched to mexilitene.    Patient Active Problem List   Diagnosis Date Noted   Osteomyelitis (HCC) 10/16/2020   Medication monitoring encounter 10/16/2020   Bilateral impacted cerumen 07/25/2019   Mixed conductive and sensorineural hearing loss of right ear with restricted hearing of left ear 04/27/2016   Otorrhea, right 04/27/2016   Perforation of right tympanic membrane 04/27/2016   Orthostatic hypotension 10/25/2015   Dehydration 10/24/2015   Nausea and vomiting 10/24/2015   Diarrhea 10/24/2015   HLD (hyperlipidemia) 08/15/2015   Midsternal chest pain 07/28/2015   Syncope 04/27/2015   Ventricular tachycardia 03/07/2015   NSVT (nonsustained ventricular tachycardia) 03/05/2015  Unstable angina pectoris (HCC) 02/12/2014   Abdominal pain, unspecified site 02/14/2013    Class: Acute   SBO (small bowel obstruction) (HCC) 02/10/2013   Ankle edema 09/02/2012   Chronic venous insufficiency 09/02/2012   Unstable angina (HCC) 07/28/2012   CKD (chronic kidney disease), stage III (HCC) 07/28/2012   Atherosclerosis of native arteries of the extremities, unspecified 10/30/2011   Tachycardia 10/18/2011   Bradycardia 10/18/2011   RBBB 10/17/2011   Chest pain with moderate risk of acute coronary syndrome 10/17/2011   Sinus tachycardia 10/17/2011   Aftercare following surgery of the  circulatory system, NEC 09/11/2011   Bilateral femoral artery stenosis (HCC) 09/11/2011   Hypotension 08/27/2011   Peripheral vascular disease, unspecified (HCC) 07/24/2011   Ischemic cardiomyopathy 06/16/2011   Chronic systolic heart failure (HCC)    Occlusion and stenosis of carotid artery without mention of cerebral infarction 06/05/2011   Pain, limb, left 06/05/2011   Intermittent claudication (HCC) 06/05/2011   Carotid stenosis 06/05/2011   Atherosclerosis of renal artery (HCC) 01/06/2011   Hx of CABG '87, '08 11/21/2010   HTN (hypertension) 11/21/2010   PVD (peripheral vascular disease) (HCC) 11/21/2010   Crohn's disease (HCC)    Anemia    Anemia     Past Surgical History:  Procedure Laterality Date   ABDOMINAL AORTAGRAM N/A 06/11/2011   Procedure: ABDOMINAL [name];  Surgeon: [name], MD;  Location: [facility] CATH LAB;  Service: Cardiovascular;  Laterality: N/A;   ABDOMINAL AORTOGRAM W/LOWER EXTREMITY N/A 09/05/2020   Procedure: ABDOMINAL AORTOGRAM W/LOWER EXTREMITY;  Surgeon: [name], MD;  Location: [facility] INVASIVE CV LAB;  Service: Cardiovascular;  Laterality: N/A;   CARDIAC CATHETERIZATION  07/28/2012   Native 3v CAD, continued patency of the SVG-OM1-LAD and SVG-OM2-left PDA. LVEF 40% with multiple WMAs   CARDIAC CATHETERIZATION N/A 03/08/2015   Procedure: Left Heart Cath and Coronary Angiography;  Surgeon: [name], MD;  Location: [facility] INVASIVE CV LAB;  Service: Cardiovascular;  Laterality: N/A;   CAROTID ENDARTERECTOMY Left 2010   CATARACT EXTRACTION, BILATERAL     COLON RESECTION  2008   COLON SURGERY     CORONARY ANGIOPLASTY     CORONARY ARTERY BYPASS GRAFT  10/31/85 & 08/19/06   ENDARTERECTOMY  08/26/2011   Procedure: ENDARTERECTOMY ILIAC;  Surgeon: [name], MD;  Location: [facility] OR;  Service: Vascular;  Laterality: Right;  Right Femoral Artery Endarterectomy with vascu guard patch angioplasty & intraoperative arteriogram.   EYE SURGERY     FEMORAL  ENDARTERECTOMY Left  06/2011   ileofemoral endarterectomy with bovine patch angioplasty   IR FLUORO GUIDE CV LINE RIGHT  10/22/2020   IR US GUIDE VASC ACCESS RIGHT  10/22/2020   LEFT HEART CATHETERIZATION WITH CORONARY ANGIOGRAM N/A 07/28/2012   Procedure: LEFT HEART CATHETERIZATION WITH CORONARY ANGIOGRAM;  Surgeon: [name], MD;  Location: [facility] CATH LAB;  Service: Cardiovascular;  Laterality: N/A;   PERIPHERAL VASCULAR BALLOON ANGIOPLASTY Right 09/05/2020   Procedure: PERIPHERAL VASCULAR BALLOON ANGIOPLASTY;  Surgeon: [name], MD;  Location: [facility] INVASIVE CV LAB;  Service: Cardiovascular;  Laterality: Right;  Posterior Tibial   PR VEIN BYPASS GRAFT,AORTO-FEM-POP  10/1985   TONSILLECTOMY     as a child       Family History  Problem Relation Age of Onset   Heart disease Father    Cancer Mother    COPD Mother    Deep vein thrombosis Brother    COPD Brother    CAD Brother    Anesthesia  problems Neg Hx     Social History   Tobacco Use   Smoking status: Never   Smokeless tobacco: Never  Vaping Use   Vaping Use: Never used  Substance Use Topics   Alcohol use: No   Drug use: No    Home Medications Prior to Admission medications   Medication Sig Start Date End Date Taking? Authorizing Provider  atorvastatin (LIPITOR) 10 MG tablet Take 10 mg by mouth daily. 12/18/19  Yes [provider]  acetaminophen (TYLENOL) 500 MG tablet Take 1,000 mg by mouth every 6 (six) hours as needed (pain).     [provider]  aspirin EC 81 MG tablet Take 81 mg by mouth daily.    [provider]  carboxymethylcellulose (REFRESH PLUS) 0.5 % SOLN Place 1 drop into both eyes 4 (four) times daily.    [provider]  cefTRIAXone (ROCEPHIN) 10 g injection  11/04/20   [provider]  cholecalciferol (VITAMIN D3) 25 MCG (1000 UNIT) tablet Take 1,000 Units by mouth daily.    [provider]  clopidogrel (PLAVIX) 75 MG tablet TAKE 1 TABLET EVERY  DAY 11/01/20   [name], MD  DAPTOmycin (CUBICIN) 500 MG injection Inject into the vein. 11/04/20   [provider]  fexofenadine (ALLEGRA) 180 MG tablet Take 180 mg by mouth daily.    [provider]  furosemide (LASIX) 40 MG tablet Take 20 mg by mouth every Monday, Wednesday, and Friday.    [provider]  ipratropium (ATROVENT) 0.03 % nasal spray Place 1 spray into both nostrils daily.    [provider]  isosorbide mononitrate (IMDUR) 120 MG 24 hr tablet Take 1 tablet (120 mg total) by mouth daily. 01/14/21   [name], MD  isosorbide mononitrate (IMDUR) 60 MG 24 hr tablet Take 1 tablet (60 mg total) by mouth daily. 01/14/21   [name], MD  losartan (COZAAR) 25 MG tablet TAKE 1 TABLET TWICE DAILY 07/08/20   [name], MD  mexiletine (MEXITIL) 200 MG capsule TAKE 1 CAPSULE (200 MG TOTAL) BY MOUTH EVERY 12 HOURS. 10/29/20   [name], MD  Multiple Vitamin (MULTIVITAMIN WITH MINERALS) TABS tablet Take 2 tablets by mouth daily.    [provider]  mupirocin ointment (BACTROBAN) 2 % Apply 1 application topically 2 (two) times daily. Apply to foot    [provider]  nitroGLYCERIN (NITROSTAT) 0.4 MG SL tablet Place 1 tablet (0.4 mg total) under the tongue every 5 (five) minutes as needed for chest pain. 01/31/20   Rosalio Macadamia, NP  OVER THE COUNTER MEDICATION Take 1 capsule by mouth daily. Nerve Health    [provider]  pantoprazole (PROTONIX) 40 MG tablet TAKE 1 TABLET EVERY DAY 11/01/20   [name], MD  Polyethyl Glycol-Propyl Glycol (SYSTANE) 0.4-0.3 % SOLN Place 1 drop into both eyes in the morning and at bedtime.    [provider]  polyethylene glycol powder (GLYCOLAX/MIRALAX) powder Take 17 g by mouth at bedtime. Mix in 8 oz liquid and drink 10/15/15   [provider]  ranolazine (RANEXA) 500 MG 12 hr tablet TAKE 2 TABLETS TWICE DAILY 09/02/20   [name], MD  spironolactone  (ALDACTONE) 25 MG tablet Take 12.5 mg by mouth every Monday, Wednesday, and Friday.    [provider]  triamcinolone ointment (KENALOG) 0.1 % Apply 1 application topically 2 (two) times daily. Mix with Mupirocin and apply to head    [provider]  vitamin  B-12 (CYANOCOBALAMIN) 1000 MCG tablet Take 2,000 mcg by mouth daily.    [provider]    Allergies    Amoxicillin, Novocain [procaine hcl], Procaine, and Metoprolol  Review of Systems   Review of Systems  Physical Exam Updated Vital Signs BP (!) 96/58   Pulse 70   Resp 20   Ht 1.6 m (5\' 3" )   Wt 58.9 kg   SpO2 100%   BMI 23.00 kg/m   Physical Exam  ED Results / Procedures / Treatments   Labs (all labs ordered are listed, but only abnormal results are displayed) Labs Reviewed  CBC - Abnormal; Notable for the following components:      Result Value   WBC 11.1 (*)    RBC 3.02 (*)    Hemoglobin 10.7 (*)    HCT 32.8 (*)    MCV 108.6 (*)    MCH 35.4 (*)    Platelets 144 (*)    All other components within normal limits  I-STAT CHEM 8, ED - Abnormal; Notable for the following components:   BUN 71 (*)    Creatinine, Ser 1.80 (*)    Glucose, Bld 140 (*)    Calcium, Ion 1.03 (*)    TCO2 21 (*)    Hemoglobin 11.6 (*)    HCT 34.0 (*)    All other components within normal limits  RESP PANEL BY RT-PCR (FLU A&B, COVID) ARPGX2  BASIC METABOLIC PANEL  MAGNESIUM  CBG MONITORING, ED  TROPONIN I (HIGH SENSITIVITY)    EKG EKG Interpretation  Date/Time:  Sunday February 09 2021 15:10:14 EDT Ventricular Rate:  109 PR Interval:  110 QRS Duration: 170 QT Interval:  439 QTC Calculation: 592 R Axis:   -80 Text Interpretation: Second degree AV block, Mobitz II Right bundle branch block second degree block new since last tracing Confirmed by 06-19-1993 [phone]) on 02/28/2021 3:26:16 PM  Radiology No results found.  Procedures .Critical Care Performed by: 04/11/2021, MD Authorized by: Linwood Dibbles,  MD   Critical care provider statement:    Critical care time (minutes):  30   Critical care was necessary to treat or prevent imminent or life-threatening deterioration of the following conditions:  Cardiac failure   Critical care was time spent personally by me on the following activities:  Discussions with consultants, evaluation of patient's response to treatment, examination of patient, ordering and performing treatments and interventions, ordering and review of laboratory studies, ordering and review of radiographic studies, pulse oximetry, re-evaluation of patient's condition, obtaining history from patient or surrogate and review of old charts   Medications Ordered in ED Medications  0.9 %  sodium chloride infusion ( Intravenous New Bag/Given 02/17/2021 1522)    ED Course  I have reviewed the triage vital signs and the nursing notes.  Pertinent labs & imaging results that were available during my care of the patient were reviewed by me and considered in my medical decision making (see chart for details).  Clinical Course as of 02/17/2021 1541  Sun Feb 09, 2021  1526 Pacing was held.  Rhythm was initially second degree heart block.  Rate in the 100s.  Called back to bedside as pt had another episode of syncope unresponsiveness, bradycardia.  Restarted external pacing.  Pt is now awake and alert now again.  80amp, rate 70 [JK]  1534 Discussed with Dr. Feb 11, 2021 cardiology, cardiology team will be coming to evaluate the patient [JK]  1534 Patient is tolerating his external pacing, blood pressure  is 90 systolic at the bedside he has no complaints [JK]    Clinical Course User Index [JK] Linwood Dibbles, MD   MDM Rules/Calculators/A&P                           Patient presented to the ED for evaluation of near syncope and and a syncopal episode witnessed by EMS.  Patient was observed to be in complete heart block.  While in the ED pacing was turned off temporarily.  Patient was Second-degree heart  block.  Shortly thereafter however he went back into complete heart block with a syncopal event.  External pacing was restarted and patient is now alert and asymptomatic.  He is being externally paced at a rate of 70.  Laboratory tests are pending.  Will continue to monitor closely. Final Clinical Impression(s) / ED Diagnoses Final diagnoses:  Complete heart block (HCC)     Linwood Dibbles, MD 02/19/2021 1541

## 2021-02-09 NOTE — Progress Notes (Signed)
ANTICOAGULATION CONSULT NOTE - Initial Consult  Pharmacy Consult for heparin Indication: chest pain/ACS  Allergies  Allergen Reactions   Amoxicillin Diarrhea   Novocain [Procaine Hcl] Other (See Comments)    Cold sweats and very bad headache   Procaine Other (See Comments)    Sweating headache   Metoprolol Hives, Itching and Rash    Patient Measurements: Height: 5\' 3"  (160 cm) Weight: 58.9 kg (129 lb 13.6 oz) IBW/kg (Calculated) : 56.9 Heparin Dosing Weight: 59kg  Vital Signs: BP: 96/71 (10/02 1605) Pulse Rate: 107 (10/02 1605)  Labs: Recent Labs    02/15/2021 1500 02/08/2021 1536  HGB 10.7* 11.6*  HCT 32.8* 34.0*  PLT 144*  --   CREATININE 1.88* 1.80*  TROPONINIHS 10,562*  --     Estimated Creatinine Clearance: 21.5 mL/min (A) (by C-G formula based on SCr of 1.8 mg/dL (H)).   Medical History: Past Medical History:  Diagnosis Date   AAA (abdominal aortic aneurysm)    a. Ectatic abdominal aorta with fusiform dilitation 3.4x3.4 and moderate thrombus burden 12/2010   Bradycardia    a. previous intolerance to beta blockers - low dose toprol started 10/2011 for tachycardia but had rash and was discontinued;  b. tolerating low dose propranolol.   Carotid artery occlusion    a. s/p L CEA in 2010;  b. 06/2014 Carotid U/S: stable LICA CEA site w/o significant stenosis bilat.   Chronic systolic heart failure (HCC)    a. 02/2015 Echo:  35-40% (echo 11/2010); c. 02/2015 Echo: EF 20-25%, diff HK, mid-apicalanteroseptal and apical AK, Gr 1 DD, mild MR.   COPD (chronic obstructive pulmonary disease) (HCC)    Coronary artery disease    a. 1969 s/p MI;  b. S/P CABG 1987;  c. S/P redo 2008;  d. 07/2012 Cath: stable->Med Rx, EF 40%; e. 02/2014 MV: anterior and posterolateral/apical infarct with mild peri-infarct ischemia->Med Rx; f. 02/2015 Cath: LM 100d, LAD 100ost, LCX 100ost, OM2 100, VG->OM3->OM4 nl, VG-> LAD nl (Y from VG->OM), VG->OM2 100p.   Crohn's disease (HCC)    a. s/p colon  resection   Gout    Hyperlipidemia    Hypertension    Ischemic cardiomyopathy    a. EF 34% (Lexiscan 06/2011); b. 35-40% (echo 11/2010); c. 02/2015 Echo: EF 20-25%, diff HK, mid-apicalanteroseptal and apical AK, Gr 1 DD, mild MR.   Peripheral vascular disease (HCC)    a. Carotid dz s/p L CEA, RAS, AAA, LE dz   RBBB    Renal artery stenosis (HCC)    a. Dopplers 12/2010 - 1-59% R ostial renal artery stenosis, 2 L renal arteries both widely patent   Syncope    Tachycardia    a. 10/2011 - atrial tach vs avnrt - BB started.   Ventricular tachycardia    a. 02/2015 ->amio 200 daily added-->later switched to mexilitene.      Assessment: 87 yo M admitted with syncope and severe braycardia w/ 2nd degree AV block requiring external pacing. Patient with complex cardiac history including AAA, bradycardia (intolerant to BB), Carotid artery disese s/p LCEA, Ischemic DCM with chronic systolic CHF (EF 82 on echo 2017), ASCAD s/p remote MI in1969 with CABG 1987 and redo 2008 and last cath 02/2015 on medical management, HTN, HLD, PVD, RBBB. Ischemic VT  (initially on Amio and changed to Mexilitene and Ranexa). LHC deferred. No anticoagulation prior to admission. Pharmacy consulted for heparin, no bolus.  ClCr ~20 ml/min. H/H, plt slightly low.  Goal of Therapy:  Heparin level 0.3-0.7  units/ml Monitor platelets by anticoagulation protocol: Yes   Plan:  Start heparin 750 units/hr, no bolus Monitor daily HL, CBC/plt Monitor for signs/symptoms of bleeding     Alphia Moh, PharmD, BCPS, BCCP Clinical Pharmacist  Please check AMION for all [facility] Pharmacy phone numbers After 10:00 PM, call Main Pharmacy [phone]

## 2021-02-09 NOTE — ED Notes (Signed)
Duc Crocket daughter [phone] requesting an update on the patient

## 2021-02-09 NOTE — ED Notes (Signed)
3 staff was at pt room when pt started reporting dizziness. Pt then went asystole on the monitor. 5 compressions done by staff. Pt became awake and started talking. Pt had a 2nd asystole episode when MD at bedside. Pacing started at this time at a rate of 70 with 80 Tameko Halder on the zoll set by Dr.Knapp. Pt is now alert and oriented x 4. Will continue to monitor.

## 2021-02-09 NOTE — ED Triage Notes (Signed)
Family called 911 for multiple syncopal episodes and dizzy spells at home. EMS reports pt became unresponsive with HR of 30s showing wide left bundle branch block on the monitor. Pacing started. EMS gave Fentanyl. Hx of 2 open heart surgeries. Last bp was 82/60.

## 2021-02-09 NOTE — H&P (Addendum)
Cardiology Admission History and Physical:   Patient ID: Karl Stevenson MRN: [id number]; DOB: 18-Nov-1926   Admission date: 03/08/2021  Primary Care Provider: Jarome Matin, MD [facility] Cardiologist: [name], MD  [facility] Electrophysiologist:  None   Chief Complaint:  syncope  Patient Profile:   Karl Stevenson is a 87 y.o. male with a complicated cardiac history including AAA, bradycardia (previously intolerant to BB and now off), Carotid artery disese s/p LCEA, Ischemic DCM with chronic systolic CHF (EF 09-62% on echo 2017), ASCAD s/p remote MI in1969 with CABG 1987 and redo 2008 and last cath 02/2015 with distal LM occlusion, occluded oLAD, occluded oLCx and OME with patent SVG to OM3>OM4, patent SVG to LAD and occluded SVG to OM2 on medical management.  He also has a hx of HTN, HLD, PVD with RAS, chronic RBBB and ischemic VT (initially on Amio and changed to Mexilitene and Ranexa).  Presented to ER with multiple syncopal episodes over the past week and today.   History of Present Illness:   Karl Stevenson is a 87 y.o. male with a complicated cardiac history including AAA, bradycardia (previously intolerant to BB and now off), Carotid artery disese s/p LCEA, Ischemic DCM with chronic systolic CHF (EF 83-66% on echo 2017), ASCAD s/p remote MI in1969 with CABG 1987 and redo 2008 and last cath 02/2015 with distal LM occlusion, occluded oLAD, occluded oLCx and OME with patent SVG to OM3>OM4, patent SVG to LAD and occluded SVG to OM2 on medical management.  He also has a hx of HTN, HLD, PVD with RAS, chronic RBBB and ischemic VT (initially on Amio and changed to Mexilitene).  Presented to ER with multiple syncopal episodes over the past week and today.   He was last seen by Dr. Excell Seltzer 12/02/2020 and was doing well except for several mechanical falls with a clavicular fx.  He has braces on his feet due to osteomyelitis.    Today presented by EMS to the ER with multiple dizzy  spells and near syncope for the past few weeks.  Symptoms have been worsening and today called EMS.  He was able to walk to the ambulance but then became dizzy and then unresponsive and was found to have a HR in the 30's.  He was placed on Zoll and paced with resolution of sx.  He was give Fentanyl for pain with pacing.  In ER he appeared to be back in normal rhythm and pacing turned off but then he became dizzy and passed out and noted on tele to have asystole.  Now back on Zoll with episodes of HR 100bpm in sinus tachycardia with RBBB and the develops 2:1 AVB and possible 3 degree AVB wth severe dizziness requiring external pacing.  Cardiology is now asked to evaulated.  He has not had any changes to his meds recently.  He denies any chest pain or pressure, SOB, PND, orthopnea, LE edema.  He has been nauseated in the ER.    Past Medical History:  Diagnosis Date   AAA (abdominal aortic aneurysm)    a. Ectatic abdominal aorta with fusiform dilitation 3.4x3.4 and moderate thrombus burden 12/2010   Bradycardia    a. previous intolerance to beta blockers - low dose toprol started 10/2011 for tachycardia but had rash and was discontinued;  b. tolerating low dose propranolol.   Carotid artery occlusion    a. s/p L CEA in 2010;  b. 06/2014 Carotid U/S: stable LICA CEA site w/o significant stenosis  bilat.   Chronic systolic heart failure (HCC)    a. 02/2015 Echo:  35-40% (echo 11/2010); c. 02/2015 Echo: EF 20-25%, diff HK, mid-apicalanteroseptal and apical AK, Gr 1 DD, mild MR.   COPD (chronic obstructive pulmonary disease) (HCC)    Coronary artery disease    a. 1969 s/p MI;  b. S/P CABG 1987;  c. S/P redo 2008;  d. 07/2012 Cath: stable->Med Rx, EF 40%; e. 02/2014 MV: anterior and posterolateral/apical infarct with mild peri-infarct ischemia->Med Rx; f. 02/2015 Cath: LM 100d, LAD 100ost, LCX 100ost, OM2 100, VG->OM3->OM4 nl, VG-> LAD nl (Y from VG->OM), VG->OM2 100p.   Crohn's disease (HCC)    a. s/p  colon resection   Gout    Hyperlipidemia    Hypertension    Ischemic cardiomyopathy    a. EF 34% (Lexiscan 06/2011); b. 35-40% (echo 11/2010); c. 02/2015 Echo: EF 20-25%, diff HK, mid-apicalanteroseptal and apical AK, Gr 1 DD, mild MR.   Peripheral vascular disease (HCC)    a. Carotid dz s/p L CEA, RAS, AAA, LE dz   RBBB    Renal artery stenosis (HCC)    a. Dopplers 12/2010 - 1-59% R ostial renal artery stenosis, 2 L renal arteries both widely patent   Syncope    Tachycardia    a. 10/2011 - atrial tach vs avnrt - BB started.   Ventricular tachycardia    a. 02/2015 ->amio 200 daily added-->later switched to mexilitene.    Past Surgical History:  Procedure Laterality Date   ABDOMINAL AORTAGRAM N/A 06/11/2011   Procedure: ABDOMINAL AORTAGRAM;  Surgeon: [name], MD;  Location: [facility] CATH LAB;  Service: Cardiovascular;  Laterality: N/A;   ABDOMINAL AORTOGRAM W/LOWER EXTREMITY N/A 09/05/2020   Procedure: ABDOMINAL AORTOGRAM W/LOWER EXTREMITY;  Surgeon: [name], MD;  Location: [facility] INVASIVE CV LAB;  Service: Cardiovascular;  Laterality: N/A;   CARDIAC CATHETERIZATION  07/28/2012   Native 3v CAD, continued patency of the SVG-OM1-LAD and SVG-OM2-left PDA. LVEF 40% with multiple WMAs   CARDIAC CATHETERIZATION N/A 03/08/2015   Procedure: Left Heart Cath and Coronary Angiography;  Surgeon: [name], MD;  Location: [facility] INVASIVE CV LAB;  Service: Cardiovascular;  Laterality: N/A;   CAROTID ENDARTERECTOMY Left 2010   CATARACT EXTRACTION, BILATERAL     COLON RESECTION  2008   COLON SURGERY     CORONARY ANGIOPLASTY     CORONARY ARTERY BYPASS GRAFT  10/31/85 & 08/19/06   ENDARTERECTOMY  08/26/2011   Procedure: ENDARTERECTOMY ILIAC;  Surgeon: [name], MD;  Location: [facility] OR;  Service: Vascular;  Laterality: Right;  Right Femoral Artery Endarterectomy with vascu guard patch angioplasty & intraoperative arteriogram.   EYE SURGERY     FEMORAL ENDARTERECTOMY Left  06/2011   ileofemoral  endarterectomy with bovine patch angioplasty   IR FLUORO GUIDE CV LINE RIGHT  10/22/2020   IR US GUIDE VASC ACCESS RIGHT  10/22/2020   LEFT HEART CATHETERIZATION WITH CORONARY ANGIOGRAM N/A 07/28/2012   Procedure: LEFT HEART CATHETERIZATION WITH CORONARY ANGIOGRAM;  Surgeon: [name], MD;  Location: [facility] CATH LAB;  Service: Cardiovascular;  Laterality: N/A;   PERIPHERAL VASCULAR BALLOON ANGIOPLASTY Right 09/05/2020   Procedure: PERIPHERAL VASCULAR BALLOON ANGIOPLASTY;  Surgeon: [name], MD;  Location: [facility] INVASIVE CV LAB;  Service: Cardiovascular;  Laterality: Right;  Posterior Tibial   PR VEIN BYPASS GRAFT,AORTO-FEM-POP  10/1985   TONSILLECTOMY     as a child     Medications Prior to Admission: Prior to Admission medications  Medication Sig Start Date End Date Taking? Authorizing Provider  atorvastatin (LIPITOR) 10 MG tablet Take 10 mg by mouth daily. 12/18/19  Yes [provider]  acetaminophen (TYLENOL) 500 MG tablet Take 1,000 mg by mouth every 6 (six) hours as needed (pain).     [provider]  aspirin EC 81 MG tablet Take 81 mg by mouth daily.    [provider]  carboxymethylcellulose (REFRESH PLUS) 0.5 % SOLN Place 1 drop into both eyes 4 (four) times daily.    [provider]  cefTRIAXone (ROCEPHIN) 10 g injection  11/04/20   [provider]  cholecalciferol (VITAMIN D3) 25 MCG (1000 UNIT) tablet Take 1,000 Units by mouth daily.    [provider]  clopidogrel (PLAVIX) 75 MG tablet TAKE 1 TABLET EVERY DAY 11/01/20   [name], MD  DAPTOmycin (CUBICIN) 500 MG injection Inject into the vein. 11/04/20   [provider]  fexofenadine (ALLEGRA) 180 MG tablet Take 180 mg by mouth daily.    [provider]  furosemide (LASIX) 40 MG tablet Take 20 mg by mouth every Monday, Wednesday, and Friday.    [provider]  ipratropium (ATROVENT) 0.03 % nasal spray Place 1 spray into both nostrils daily.     [provider]  isosorbide mononitrate (IMDUR) 120 MG 24 hr tablet Take 1 tablet (120 mg total) by mouth daily. 01/14/21   [name], MD  isosorbide mononitrate (IMDUR) 60 MG 24 hr tablet Take 1 tablet (60 mg total) by mouth daily. 01/14/21   [name], MD  losartan (COZAAR) 25 MG tablet TAKE 1 TABLET TWICE DAILY 07/08/20   [name], MD  mexiletine (MEXITIL) 200 MG capsule TAKE 1 CAPSULE (200 MG TOTAL) BY MOUTH EVERY 12 HOURS. 10/29/20   [name], MD  Multiple Vitamin (MULTIVITAMIN WITH MINERALS) TABS tablet Take 2 tablets by mouth daily.    [provider]  mupirocin ointment (BACTROBAN) 2 % Apply 1 application topically 2 (two) times daily. Apply to foot    [provider]  nitroGLYCERIN (NITROSTAT) 0.4 MG SL tablet Place 1 tablet (0.4 mg total) under the tongue every 5 (five) minutes as needed for chest pain. 01/31/20   Rosalio Macadamia, NP  OVER THE COUNTER MEDICATION Take 1 capsule by mouth daily. Nerve Health    [provider]  pantoprazole (PROTONIX) 40 MG tablet TAKE 1 TABLET EVERY DAY 11/01/20   [name], MD  Polyethyl Glycol-Propyl Glycol (SYSTANE) 0.4-0.3 % SOLN Place 1 drop into both eyes in the morning and at bedtime.    [provider]  polyethylene glycol powder (GLYCOLAX/MIRALAX) powder Take 17 g by mouth at bedtime. Mix in 8 oz liquid and drink 10/15/15   [provider]  ranolazine (RANEXA) 500 MG 12 hr tablet TAKE 2 TABLETS TWICE DAILY 09/02/20   [name], MD  spironolactone (ALDACTONE) 25 MG tablet Take 12.5 mg by mouth every Monday, Wednesday, and Friday.    [provider]  triamcinolone ointment (KENALOG) 0.1 % Apply 1 application topically 2 (two) times daily. Mix with Mupirocin and apply to head    [provider]  vitamin B-12 (CYANOCOBALAMIN) 1000 MCG tablet Take 2,000 mcg by mouth daily.    [provider]     Allergies:    Allergies  Allergen Reactions    Amoxicillin Diarrhea   Novocain [Procaine Hcl] Other (See Comments)    Cold sweats and very bad headache   Procaine Other (See Comments)  Sweating headache   Metoprolol Hives, Itching and Rash    Social History:   Social History   Socioeconomic History   Marital status: Married    Spouse name: Not on file   Number of children: Not on file   Years of education: Not on file   Highest education level: Not on file  Occupational History   Not on file  Tobacco Use   Smoking status: Never   Smokeless tobacco: Never  Vaping Use   Vaping Use: Never used  Substance and Sexual Activity   Alcohol use: No   Drug use: No   Sexual activity: Not on file  Other Topics Concern   Not on file  Social History Narrative   Not on file   Social Determinants of Health   Financial Resource Strain: Not on file  Food Insecurity: Not on file  Transportation Needs: Not on file  Physical Activity: Not on file  Stress: Not on file  Social Connections: Not on file  Intimate Partner Violence: Not on file    Family History:   The patient's family history includes CAD in his brother; COPD in his brother and mother; Cancer in his mother; Deep vein thrombosis in his brother; Heart disease in his father. There is no history of Anesthesia problems.    ROS:  Please see the history of present illness.  All other ROS reviewed and negative.     Physical Exam/Data:   Vitals:   02/13/2021 1511 03/10/2021 1519 02/08/2021 1530 03/07/2021 1545  BP: (!) 82/65  (!) 96/58 104/72  Pulse: (!) 105  70 (!) 103  Resp: 18  20 (!) 25  SpO2: 100%  100% 100%  Weight:  58.9 kg    Height:  5\' 3"  (1.6 m)     No intake or output data in the 24 hours ending 03/05/2021 1558 Last 3 Weights 02/20/2021 12/02/2020 11/26/2020  Weight (lbs) 129 lb 13.6 oz 129 lb 12.8 oz 132 lb 4.4 oz  Weight (kg) 58.9 kg 58.877 kg 60 kg     Body mass index is 23 kg/m.  General:  thin frail appearing in NAD except when HR drops and then becomes very  dizzy HEENT: normal Lymph: no adenopathy Neck: no JVD Endocrine:  No thryomegaly Vascular: No carotid bruits; FA pulses 2+ bilaterally without bruits  Cardiac:  normal S1, S2; RRR; no murmur  Lungs:  crackles at bases bilaterally Abd: soft, nontender, no hepatomegaly  Ext: no edema Musculoskeletal:  No deformities, BUE and BLE strength normal and equal Skin: warm and dry  Neuro:  CNs 2-12 intact, no focal abnormalities noted Psych:  Normal affect    EKG:  The ECG that was done NSR to ST with RBBB at baseline with episodes of transient 2nd AVB and possible complete HR with HR< 30bpm - this was personally reviewed  Relevant CV Studies: none  Laboratory Data:  High Sensitivity Troponin:  No results for input(s): TROPONINIHS in the last 720 hours.    Chemistry Recent Labs  Lab 02/22/2021 1536  NA 138  K 3.9  CL 105  GLUCOSE 140*  BUN 71*  CREATININE 1.80*    No results for input(s): PROT, ALBUMIN, AST, ALT, ALKPHOS, BILITOT in the last 168 hours. Hematology Recent Labs  Lab 02/20/2021 1500 02/21/2021 1536  WBC 11.1*  --   RBC 3.02*  --   HGB 10.7* 11.6*  HCT 32.8* 34.0*  MCV 108.6*  --   MCH 35.4*  --  MCHC 32.6  --   RDW 13.2  --   PLT 144*  --    BNPNo results for input(s): BNP, PROBNP in the last 168 hours.  DDimer No results for input(s): DDIMER in the last 168 hours.   Radiology/Studies:  No results found.   Assessment and Plan:   Syncope/Intermittent high grade AV block -has had multiple dizzy and presyncopal episodes for the past few weeks which have become more frequent and more severe this week -called EMS today for worsening sx and had syncope in ambulance with severe bradycardia and 2nd degree AVB requiring external pacing -in ER was back in ST at 100bpm with RBBB but then on tele has episodes of sudden drop in HR to the 30's requiring pacing due to 2nd and 3rd degree AVB with presyncope.  According to ER had an episode of asystole on monitor with  syncope but tele not saved.   -currently no external pacer and when turned down to 30bpm he is fully paced and symptomatic -labs show SCr 1.8, K+ 3.9 -check Mag, TSH -discussed case with Interventionalist on call and will plan temporary pacing lead  2.  ASCAD -s/p remote MI in1969  -s/p CABG 1987 and redo 2008  -last cath 02/2015 with distal LM occlusion, occluded oLAD, occluded oLCx and OME with patent SVG to OM3>OM4, patent SVG to LAD and occluded SVG to OM2 on medical management -denies any anginal sx of SOB or CP -continue ASA, Plavix and statin -hold Imdur due to hypotension  3.  Ischemic DCM/acute systolic CHF -last EF assessment in 2017 with EF 30-35% -appears in acute CHF today with crackles in lungs and Cxray with vascular congestion -likely related to intermittent Heart block and bradycardia>>he has edema of his eye lids as well > myxedema so will check TSH -start Lasix 40mg  IV daily and follow daily weights, renal function and strict I&O's -repeat 2D echo to reassess LVF -hold ARB and Imdur due to hypotension -no BB due to heart block and bradycardia  4.  Ischemic VT -has been on Amio and now on Mexilitene and Ranexa per EP -last EF assessment was in   2017 when EF was 30-35% with focal wall motion abnormalities -repeat Echo to reassess LVF.   -hold Mexilitene and Ranexa for now until seen by EP in am  5.  HLD -LDL goal < 70 -continue Lipitor 10mg  daily  6.  PVD -hx of AAA with thrombus -hx of LCEA with 1-39% B/L stenosis 09/2020 -mild LLE arterial disease by doplers 09/2020   The patient is critically ill with multiple organ systems failure and requires high complexity decision making for assessment and support, frequent evaluation and titration of therapies, application of advanced monitoring technologies and extensive interpretation of multiple databases. Critical Care Time devoted to patient care services described in this note independent of APP time is 60 minutes  with >50% of time spent in direct patient care.    5. 103  New York Heart Association (NYHA) Functional Class NYHA Class II   Severity of Illness: The appropriate patient status for this patient is INPATIENT. Inpatient status is judged to be reasonable and necessary in order to provide the required intensity of service to ensure the patient's safety. The patient's presenting symptoms, physical exam findings, and initial radiographic and laboratory data in the context of their chronic comorbidities is felt to place them at high risk for further clinical deterioration. Furthermore, it is not anticipated that the patient will be medically stable  for discharge from the hospital within 2 midnights of admission. The following factors support the patient status of inpatient.   " The patient's presenting symptoms include dizziness and syncope. " The worrisome physical exam findings include crackles at bases . " The initial radiographic and laboratory data are worrisome because of Cxray with mild vascular congestion. " The chronic co-morbidities include CAD, CHF, high grade AV block with syncope.   * I certify that at the point of admission it is my clinical judgment that the patient will require inpatient hospital care spanning beyond 2 midnights from the point of admission due to high intensity of service, high risk for further deterioration and high frequency of surveillance required.*   For questions or updates, please contact [facility] Please consult [email or web address] for contact info under     Signed, Armanda Magic, MD  March 09, 2021 3:58 PM

## 2021-02-09 NOTE — Progress Notes (Signed)
hsTrop > 10,000 but likely related to external pacing and hypotension/syncope.  Discussed with Dr. [place] and given no CP or SOB and AKI with SCr 1.8, will hold off on cath at this time.  Will start in IV Heparin gtt.

## 2021-02-10 ENCOUNTER — Inpatient Hospital Stay (HOSPITAL_COMMUNITY): Payer: Medicare PPO

## 2021-02-10 ENCOUNTER — Inpatient Hospital Stay (HOSPITAL_COMMUNITY): Admission: EM | Disposition: E | Payer: Self-pay | Source: Home / Self Care | Attending: Cardiology

## 2021-02-10 ENCOUNTER — Encounter (HOSPITAL_COMMUNITY): Payer: Self-pay | Admitting: Cardiology

## 2021-02-10 DIAGNOSIS — I5022 Chronic systolic (congestive) heart failure: Secondary | ICD-10-CM

## 2021-02-10 DIAGNOSIS — I442 Atrioventricular block, complete: Secondary | ICD-10-CM

## 2021-02-10 DIAGNOSIS — I5042 Chronic combined systolic (congestive) and diastolic (congestive) heart failure: Secondary | ICD-10-CM | POA: Diagnosis not present

## 2021-02-10 DIAGNOSIS — R55 Syncope and collapse: Secondary | ICD-10-CM

## 2021-02-10 DIAGNOSIS — I255 Ischemic cardiomyopathy: Secondary | ICD-10-CM | POA: Diagnosis not present

## 2021-02-10 DIAGNOSIS — R001 Bradycardia, unspecified: Secondary | ICD-10-CM | POA: Diagnosis not present

## 2021-02-10 HISTORY — PX: PACEMAKER IMPLANT: EP1218

## 2021-02-10 LAB — ECHOCARDIOGRAM COMPLETE
Area-P 1/2: 12.64 cm2
Height: 63 in
S' Lateral: 4.6 cm
Single Plane A4C EF: 32.6 %
Weight: 2077.62 oz

## 2021-02-10 LAB — BASIC METABOLIC PANEL
Anion gap: 11 (ref 5–15)
BUN: 60 mg/dL — ABNORMAL HIGH (ref 8–23)
CO2: 19 mmol/L — ABNORMAL LOW (ref 22–32)
Calcium: 8.9 mg/dL (ref 8.9–10.3)
Chloride: 106 mmol/L (ref 98–111)
Creatinine, Ser: 1.74 mg/dL — ABNORMAL HIGH (ref 0.61–1.24)
GFR, Estimated: 37 mL/min — ABNORMAL LOW (ref >60)
Glucose, Bld: 140 mg/dL — ABNORMAL HIGH (ref 70–99)
Potassium: 4.2 mmol/L (ref 3.5–5.1)
Sodium: 136 mmol/L (ref 135–145)

## 2021-02-10 LAB — CBC
HCT: 30.9 % — ABNORMAL LOW (ref 39.0–52.0)
Hemoglobin: 10.1 g/dL — ABNORMAL LOW (ref 13.0–17.0)
MCH: 34.8 pg — ABNORMAL HIGH (ref 26.0–34.0)
MCHC: 32.7 g/dL (ref 30.0–36.0)
MCV: 106.6 fL — ABNORMAL HIGH (ref 80.0–100.0)
Platelets: 132 10*3/uL — ABNORMAL LOW (ref 150–400)
RBC: 2.9 MIL/uL — ABNORMAL LOW (ref 4.22–5.81)
RDW: 13.1 % (ref 11.5–15.5)
WBC: 10.3 10*3/uL (ref 4.0–10.5)
nRBC: 0 % (ref 0.0–0.2)

## 2021-02-10 LAB — SURGICAL PCR SCREEN
MRSA, PCR: NEGATIVE
Staphylococcus aureus: NEGATIVE

## 2021-02-10 LAB — MAGNESIUM: Magnesium: 2.2 mg/dL (ref 1.7–2.4)

## 2021-02-10 LAB — HEPARIN LEVEL (UNFRACTIONATED): Heparin Unfractionated: 0.55 IU/mL (ref 0.30–0.70)

## 2021-02-10 SURGERY — PACEMAKER IMPLANT

## 2021-02-10 MED ORDER — HEPARIN (PORCINE) IN NACL 1000-0.9 UT/500ML-% IV SOLN
INTRAVENOUS | Status: DC | PRN
  Administered 2021-02-10: 500 mL

## 2021-02-10 MED ORDER — LIDOCAINE HCL 1 % IJ SOLN
INTRAMUSCULAR | Status: AC
  Filled 2021-02-10: qty 60

## 2021-02-10 MED ORDER — SODIUM CHLORIDE 0.9 % IV SOLN
INTRAVENOUS | Status: DC
  Administered 2021-02-10: 250 mL via INTRAVENOUS

## 2021-02-10 MED ORDER — ACETAMINOPHEN 325 MG PO TABS
325.0000 mg | ORAL_TABLET | ORAL | Status: DC | PRN
  Administered 2021-02-11: 325 mg via ORAL
  Administered 2021-02-12: 650 mg via ORAL
  Filled 2021-02-10: qty 1
  Filled 2021-02-10: qty 2

## 2021-02-10 MED ORDER — SODIUM CHLORIDE 0.9 % IV SOLN: INTRAVENOUS | Status: DC

## 2021-02-10 MED ORDER — VANCOMYCIN HCL IN DEXTROSE 1-5 GM/200ML-% IV SOLN
INTRAVENOUS | Status: AC
  Filled 2021-02-10: qty 200

## 2021-02-10 MED ORDER — LIDOCAINE HCL (PF) 1 % IJ SOLN
INTRAMUSCULAR | Status: DC | PRN
  Administered 2021-02-10: 60 mL

## 2021-02-10 MED ORDER — IOHEXOL 350 MG/ML SOLN
INTRAVENOUS | Status: DC | PRN
  Administered 2021-02-10: 19 mL

## 2021-02-10 MED ORDER — SODIUM CHLORIDE 0.9 % IV SOLN
INTRAVENOUS | Status: AC
  Filled 2021-02-10: qty 2

## 2021-02-10 MED ORDER — VANCOMYCIN HCL IN DEXTROSE 1-5 GM/200ML-% IV SOLN
1000.0000 mg | INTRAVENOUS | Status: AC
Start: 2021-02-10 — End: 2021-02-10
  Administered 2021-02-10: 1000 mg via INTRAVENOUS
  Filled 2021-02-10: qty 200

## 2021-02-10 MED ORDER — CHLORHEXIDINE GLUCONATE 4 % EX LIQD
60.0000 mL | Freq: Once | CUTANEOUS | Status: AC
  Administered 2021-02-10: 4 via TOPICAL
  Filled 2021-02-10: qty 60

## 2021-02-10 MED ORDER — CHLORHEXIDINE GLUCONATE 4 % EX LIQD: 60.0000 mL | Freq: Once | CUTANEOUS | Status: AC

## 2021-02-10 MED ORDER — ONDANSETRON HCL 4 MG/2ML IJ SOLN
4.0000 mg | Freq: Four times a day (QID) | INTRAMUSCULAR | Status: DC | PRN
  Administered 2021-02-12: 4 mg via INTRAVENOUS
  Filled 2021-02-10: qty 2

## 2021-02-10 MED ORDER — VANCOMYCIN HCL IN DEXTROSE 1-5 GM/200ML-% IV SOLN
1000.0000 mg | Freq: Two times a day (BID) | INTRAVENOUS | Status: AC
Start: 2021-02-11 — End: 2021-02-11
  Administered 2021-02-11: 1000 mg via INTRAVENOUS

## 2021-02-10 MED ORDER — GENTAMICIN SULFATE 40 MG/ML IJ SOLN
80.0000 mg | INTRAMUSCULAR | Status: AC
  Administered 2021-02-10: 80 mg
  Filled 2021-02-10: qty 2

## 2021-02-10 MED ORDER — HEPARIN (PORCINE) IN NACL 1000-0.9 UT/500ML-% IV SOLN
INTRAVENOUS | Status: AC
  Filled 2021-02-10: qty 500

## 2021-02-10 MED FILL — Lidocaine HCl Local Preservative Free (PF) Inj 1%: INTRAMUSCULAR | Qty: 30 | Status: AC

## 2021-02-10 SURGICAL SUPPLY — 22 items
CABLE SURGICAL S-101-97-12 (CABLE) ×3 IMPLANT
CATH ATTAIN COM SURV 6250V-MB2 (CATHETERS) ×1 IMPLANT
CATH ATTAIN SEL SURV 6248V-90 (CATHETERS) ×1 IMPLANT
CATH HEX JOS 2-5-2 65CM 6F REP (CATHETERS) ×1 IMPLANT
CATH SELECT PACE 669183 (CATHETERS) ×1 IMPLANT
DEVICE CRTP PERCEPTA QUAD MRI (Pacemaker) ×1 IMPLANT
KIT ESSENTIALS PG (KITS) ×1 IMPLANT
LEAD ATTAIN PERFORM ST 4398-88 (Lead) ×1 IMPLANT
LEAD ATTAIN PERFORMA S 4598-88 (Lead) IMPLANT
LEAD CAPSURE NOVUS 5076-52CM (Lead) ×1 IMPLANT
LEAD CAPSURE NOVUS 5076-58CM (Lead) ×1 IMPLANT
LEAD SELECT SECURE 3830 383069 (Lead) IMPLANT
PAD PRO RADIOLUCENT 2001M-C (PAD) ×3 IMPLANT
SELECT SECURE 3830 383069 (Lead) IMPLANT
SHEATH 7FR PRELUDE SNAP 13 (SHEATH) ×2 IMPLANT
SHEATH 9.5FR PRELUDE SNAP 13 (SHEATH) ×1 IMPLANT
SHEATH 9FR PRELUDE SNAP 13 (SHEATH) ×1 IMPLANT
SLITTER 6232ADJ (MISCELLANEOUS) ×1 IMPLANT
TRAY PACEMAKER INSERTION (PACKS) ×3 IMPLANT
WIRE ACUITY WHISPER EDS 4648 (WIRE) ×2 IMPLANT
WIRE HI TORQ VERSACORE-J 145CM (WIRE) ×1 IMPLANT
WIRE MAILMAN 182CM (WIRE) ×1 IMPLANT

## 2021-02-10 NOTE — Progress Notes (Addendum)
Progress Note  Patient Name: Karl Stevenson Date of Encounter: 02-11-2021  [facility] Cardiologist: [name], MD   Subjective   Feeling OK.  Denies chest pain or shortness of breath.   Inpatient Medications    Scheduled Meds:  aspirin EC  81 mg Oral Daily   atorvastatin  10 mg Oral Daily   chlorhexidine  60 mL Topical Once   chlorhexidine  60 mL Topical Once   Chlorhexidine Gluconate Cloth  6 each Topical Daily   cholecalciferol  1,000 Units Oral Daily   gentamicin irrigation  80 mg Irrigation On Call   loratadine  10 mg Oral Daily   mouth rinse  15 mL Mouth Rinse BID   mupirocin ointment  1 application Topical BID   pantoprazole  40 mg Oral Daily   polyethylene glycol  17 g Oral QHS   polyvinyl alcohol  1 drop Both Eyes BID   sodium chloride flush  10-40 mL Intracatheter Q12H   sodium chloride flush  3 mL Intravenous Q12H   triamcinolone ointment  1 application Topical BID   vitamin B-12  2,000 mcg Oral Daily   Continuous Infusions:  sodium chloride     sodium chloride     sodium chloride     sodium chloride     vancomycin     PRN Meds: sodium chloride, acetaminophen, ondansetron (ZOFRAN) IV, sodium chloride flush, sodium chloride flush   Vital Signs    Vitals:   Feb 11, 2021 0500 02-11-21 0600 Feb 11, 2021 0700 2021-02-11 0800  BP: 92/66 108/75 104/76 108/72  Pulse: (!) 111 (!) 113 (!) 114 (!) 114  Resp: 16 (!) 21 (!) 24 (!) 27  Temp:   (!) 97.3 F (36.3 C)   TempSrc:   Axillary   SpO2: 100% 99% 99% 99%  Weight:      Height:        Intake/Output Summary (Last 24 hours) at 2021/02/11 0926 Last data filed at 02-11-2021 0600 Gross per 24 hour  Intake 83.3 ml  Output 125 ml  Net -41.7 ml   Last 3 Weights 02/15/2021 12/02/2020 11/26/2020  Weight (lbs) 129 lb 13.6 oz 129 lb 12.8 oz 132 lb 4.4 oz  Weight (kg) 58.9 kg 58.877 kg 60 kg      Telemetry    High grade AV block. VP.  Long runs of VT ~85 bpm - Personally Reviewed  ECG     03/09/2021:  Complete heart block with ventricular escape.  Rate 61 bpm.- Personally Reviewed  Physical Exam   GEN: No acute distress.   Neck: No JVD.  Temp wire in place Cardiac: RRR, no murmurs, rubs, or gallops.  Respiratory: Clear to auscultation bilaterally. GI: Soft, nontender, non-distended  MS: No edema; No deformity. Neuro:  Nonfocal  Psych: Normal affect   Labs    High Sensitivity Troponin:   Recent Labs  Lab 02/19/2021 1500 03/02/2021 1810  TROPONINIHS 10,562* 13,111*     Chemistry Recent Labs  Lab 02/23/2021 1500 03/08/2021 1536 11-Feb-2021 0134  NA 137 138 136  K 3.9 3.9 4.2  CL 107 105 106  CO2 17*  --  19*  GLUCOSE 143* 140* 140*  BUN 63* 71* 60*  CREATININE 1.88* 1.80* 1.74*  CALCIUM 8.4*  --  8.9  MG 1.3*  --  2.2  GFRNONAA 33*  --  37*  ANIONGAP 13  --  11    Lipids No results for input(s): CHOL, TRIG, HDL, LABVLDL, LDLCALC, CHOLHDL in the last 168  hours.  Hematology Recent Labs  Lab Mar 08, 2021 1500 03/08/2021 1536 02/15/2021 0134  WBC 11.1*  --  10.3  RBC 3.02*  --  2.90*  HGB 10.7* 11.6* 10.1*  HCT 32.8* 34.0* 30.9*  MCV 108.6*  --  106.6*  MCH 35.4*  --  34.8*  MCHC 32.6  --  32.7  RDW 13.2  --  13.1  PLT 144*  --  132*   Thyroid  Recent Labs  Lab March 08, 2021 1500  TSH 3.859    BNPNo results for input(s): BNP, PROBNP in the last 168 hours.  DDimer No results for input(s): DDIMER in the last 168 hours.   Radiology    CARDIAC CATHETERIZATION  Result Date: Mar 08, 2021 Successful placement of temporary transvenous pacemaker via right internal jugular vein.   DG Chest Portable 1 View  Result Date: 03/08/21 CLINICAL DATA:  Syncope. EXAM: PORTABLE CHEST 1 VIEW COMPARISON:  11/11/2020 FINDINGS: Sternotomy wires unchanged. Lungs are adequately inflated demonstrate hazy opacification of the perihilar vessels likely mild vascular congestion. No significant effusion. Cardiomediastinal silhouette and remainder of the exam is unchanged. IMPRESSION: Mild vascular  congestion. Electronically Signed   By: Elberta Fortis M.D.   On: 08-Mar-2021 16:04    Cardiac Studies   Echo pending  Patient Profile     87 y.o. male with CAD status post CABG x2 (1987, 2008), medically managed obstructive coronary disease, carotid stenosis status post left CEA, chronic systolic and diastolic heart failure (LVEF 30 to 35%) ischemic cardiomyopathy, hypertension, hyperlipidemia, ischemic VT, PAD, renal artery stenosis admitted with complete heart block and syncope.  Assessment & Plan    # Syncope: # CHB:  Temp wire currently in place.  He is going for pacemaker implantation with Dr. Graciela Husbands or Dr. Ladona Ridgel later today.  When his temp wire was turned down at 730 this morning he had no significant ventricular escape.  # NSTEMI: #Hyperlipidemia: High-sensitivity troponin elevated to over 13,000.  He has not had any ischemic symptoms.  He does have medically managed coronary disease not amenable to PCI at his last cath in 2016.  Continue with medical management.  Once he has a pacemaker in place he can start a beta-blocker.  Continue aspirin and atorvastatin.  He is on Ranexa for both CAD and ischemic VT.  His home clopidogrel is on hold for pacemaker implantation.  # VT: Slow VT on telemetry.  He is asymptomatic.  Per Dr. Ladona Ridgel he will likely start amiodarone after PPM.   # Chronic systolic and diastolic heart failure:  Baseline LVEF is 30 to 35%.  He is euvolemic on exam.  No plan for further ischemic evaluation as above.  Resume Imdur prior to discharge and start beta-blocker blood pressure tolerates post PPM.   Per Dr. Ladona Ridgel, EP will take over as primary.  We will sign off.  Please call if questions arise.    Total critical care time: 35 minutes. Critical care time was exclusive of separately billable procedures and treating other patients. Critical care was necessary to treat or prevent imminent or life-threatening deterioration. Critical care was time spent personally by me  on the following activities: development of treatment plan with patient and/or surrogate as well as nursing, discussions with consultants, evaluation of patient's response to treatment, examination of patient, obtaining history from patient or surrogate, ordering and performing treatments and interventions, ordering and review of laboratory studies, ordering and review of radiographic studies, pulse oximetry and re-evaluation of patient's condition.   For questions or updates, please  contact [facility] Please consult [email or web address] for contact info under        Signed, Chilton Si, MD  02/16/2021, 9:26 AM

## 2021-02-10 NOTE — Plan of Care (Signed)

## 2021-02-10 NOTE — Discharge Instructions (Signed)
After Your Pacemaker   You have a Medtronic Pacemaker  ACTIVITY Do not lift your arm above shoulder height for 1 week after your procedure. After 7 days, you may progress as below.  You should remove your sling 24 hours after your procedure, unless otherwise instructed by your provider.     Monday February 17, 2021  Tuesday February 18, 2021 Wednesday February 19, 2021 Thursday February 20, 2021   Do not lift, push, pull, or carry anything over 10 pounds with the affected arm until 6 weeks (Monday March 24, 2021 ) after your procedure.   You may drive AFTER your wound check, unless you have been told otherwise by your provider.   Ask your healthcare provider when you can go back to work   INCISION/Dressing If you are on a blood thinner such as Pradaxa please confirm with your provider when this should be resumed.   If large square, outer bandage is left in place, this can be removed after 24 hours from your procedure. Do not remove steri-strips or glue as below.   Monitor your Pacemaker site for redness, swelling, and drainage. Call the device clinic at [phone] if you experience these symptoms or fever/chills.  If your incision is sealed with Steri-strips or staples, you may shower 10 days after your procedure or when told by your provider. Do not remove the steri-strips or let the shower hit directly on your site. You may wash around your site with soap and water.    Avoid lotions, ointments, or perfumes over your incision until it is well-healed.  You may use a hot tub or a pool AFTER your wound check appointment if the incision is completely closed.  PAcemaker Alerts:  Some alerts are vibratory and others beep. These are NOT emergencies. Please call our office to let us know. If this occurs at night or on weekends, it can wait until the next business day. Send a remote transmission.  If your device is capable of reading fluid status (for heart failure), you will be offered  monthly monitoring to review this with you.   DEVICE MANAGEMENT Remote monitoring is used to monitor your pacemaker from home. This monitoring is scheduled every 91 days by our office. It allows [place] to keep an eye on the functioning of your device to ensure it is working properly. You will routinely see your Electrophysiologist annually (more often if necessary).   You should receive your ID card for your new device in 4-8 weeks. Keep this card with you at all times once received. Consider wearing a medical alert bracelet or necklace.  Your Pacemaker may be MRI compatible. This will be discussed at your next office visit/wound check.  You should avoid contact with strong electric or magnetic fields.   Do not use amateur (ham) radio equipment or electric (arc) welding torches. MP3 player headphones with magnets should not be used. Some devices are safe to use if held at least 12 inches (30 cm) from your Pacemaker. These include power tools, lawn mowers, and speakers. If you are unsure if something is safe to use, ask your health care provider.  When using your cell phone, hold it to the ear that is on the opposite side from the Pacemaker. Do not leave your cell phone in a pocket over the Pacemaker.  You may safely use electric blankets, heating pads, computers, and microwave ovens.  Call the office right away if: You have chest pain. You feel more short of breath than  you have felt before. You feel more light-headed than you have felt before. Your incision starts to open up.  This information is not intended to replace advice given to you by your health care provider. Make sure you discuss any questions you have with your health care provider.

## 2021-02-10 NOTE — Progress Notes (Signed)
  Echocardiogram 2D Echocardiogram has been performed.  Pieter Partridge 03/07/2021, 10:53 AM

## 2021-02-10 NOTE — Progress Notes (Signed)
ANTICOAGULATION CONSULT NOTE  Pharmacy Consult for heparin Indication: chest pain/ACS  Allergies  Allergen Reactions   Amoxicillin Diarrhea   Novocain [Procaine Hcl] Other (See Comments)    Cold sweats and very bad headache   Procaine Other (See Comments)    Sweating headache   Metoprolol Hives, Itching and Rash    Patient Measurements: Height: 5\' 3"  (160 cm) Weight: 58.9 kg (129 lb 13.6 oz) IBW/kg (Calculated) : 56.9 Heparin Dosing Weight: 59kg  Vital Signs: Temp: 98 F (36.7 C) (10/02 2300) Temp Source: Oral (10/02 2300) BP: 101/70 (10/03 0100) Pulse Rate: 107 (10/03 0100)  Labs: Recent Labs    02/28/21 1500 February 28, 2021 1536 February 28, 2021 1810 03/08/2021 0134  HGB 10.7* 11.6*  --  10.1*  HCT 32.8* 34.0*  --  30.9*  PLT 144*  --   --  132*  HEPARINUNFRC  --   --   --  0.55  CREATININE 1.88* 1.80*  --   --   TROPONINIHS 04/12/21*  --  13,111*  --      Estimated Creatinine Clearance: 21.5 mL/min (A) (by C-G formula based on SCr of 1.8 mg/dL (H)).    Assessment: 87 yo M admitted with syncope and severe braycardia w/ 2nd degree AV block requiring external pacing. Patient with complex cardiac history including AAA, bradycardia (intolerant to BB), Carotid artery disese s/p LCEA, Ischemic DCM with chronic systolic CHF (EF 82 on echo 2017), ASCAD s/p remote MI in1969 with CABG 1987 and redo 2008 and last cath 02/2015 on medical management, HTN, HLD, PVD, RBBB. Ischemic VT  (initially on Amio and changed to Mexilitene and Ranexa). LHC deferred. No anticoagulation prior to admission. Pharmacy consulted for heparin, no bolus.  Heparin level therapeutic (0.55) on gtt at 750 units/hr. No bleeding noted.  Goal of Therapy:  Heparin level 0.3-0.7 units/ml Monitor platelets by anticoagulation protocol: Yes   Plan:  Continue heparin 750 units/hr Will f/u 6hr confirmatory heparin level  03/2015, PharmD, BCPS Please see amion for complete clinical pharmacist phone  list 03/02/2021 2:13 AM

## 2021-02-11 ENCOUNTER — Encounter (HOSPITAL_COMMUNITY): Payer: Self-pay | Admitting: Internal Medicine

## 2021-02-11 ENCOUNTER — Inpatient Hospital Stay (HOSPITAL_COMMUNITY): Payer: Medicare PPO

## 2021-02-11 DIAGNOSIS — I442 Atrioventricular block, complete: Secondary | ICD-10-CM | POA: Diagnosis not present

## 2021-02-11 LAB — CBC
HCT: 32.2 % — ABNORMAL LOW (ref 39.0–52.0)
Hemoglobin: 10.7 g/dL — ABNORMAL LOW (ref 13.0–17.0)
MCH: 35.1 pg — ABNORMAL HIGH (ref 26.0–34.0)
MCHC: 33.2 g/dL (ref 30.0–36.0)
MCV: 105.6 fL — ABNORMAL HIGH (ref 80.0–100.0)
Platelets: 110 10*3/uL — ABNORMAL LOW (ref 150–400)
RBC: 3.05 MIL/uL — ABNORMAL LOW (ref 4.22–5.81)
RDW: 13.4 % (ref 11.5–15.5)
WBC: 12.7 10*3/uL — ABNORMAL HIGH (ref 4.0–10.5)
nRBC: 0.3 % — ABNORMAL HIGH (ref 0.0–0.2)

## 2021-02-11 LAB — BASIC METABOLIC PANEL
Anion gap: 14 (ref 5–15)
BUN: 90 mg/dL — ABNORMAL HIGH (ref 8–23)
CO2: 15 mmol/L — ABNORMAL LOW (ref 22–32)
Calcium: 8.3 mg/dL — ABNORMAL LOW (ref 8.9–10.3)
Chloride: 107 mmol/L (ref 98–111)
Creatinine, Ser: 2.57 mg/dL — ABNORMAL HIGH (ref 0.61–1.24)
GFR, Estimated: 23 mL/min — ABNORMAL LOW (ref >60)
Glucose, Bld: 112 mg/dL — ABNORMAL HIGH (ref 70–99)
Potassium: 5.2 mmol/L — ABNORMAL HIGH (ref 3.5–5.1)
Sodium: 136 mmol/L (ref 135–145)

## 2021-02-11 LAB — CALCIUM, IONIZED: Calcium, Ionized, Serum: 4.5 mg/dL (ref 4.5–5.6)

## 2021-02-11 LAB — MAGNESIUM: Magnesium: 2.1 mg/dL (ref 1.7–2.4)

## 2021-02-11 MED ORDER — SODIUM CHLORIDE 0.9 % IV BOLUS
500.0000 mL | Freq: Once | INTRAVENOUS | Status: AC
  Administered 2021-02-11: 500 mL via INTRAVENOUS

## 2021-02-11 MED ORDER — AMIODARONE HCL 200 MG PO TABS
400.0000 mg | ORAL_TABLET | Freq: Two times a day (BID) | ORAL | Status: DC
  Administered 2021-02-11 (×2): 400 mg via ORAL
  Filled 2021-02-11 (×2): qty 2

## 2021-02-11 NOTE — Progress Notes (Addendum)
K and Mg stable on BMET.  AKI noted, likely with sustained elevated heart rates today and NPO time yesterday. Will give NS bolus over next several hours and recheck BMET in am.  Casimiro Needle "Mardelle Matte" Lanna Poche, PA-C    Dorathy Daft

## 2021-02-11 NOTE — Plan of Care (Signed)
  Problem: Cardiac: Goal: Ability to achieve and maintain adequate cardiopulmonary perfusion will improve Outcome: Progressing   

## 2021-02-11 NOTE — Progress Notes (Addendum)
Electrophysiology Rounding Note  Patient Name: Karl Stevenson Date of Encounter: 02/11/2021  Primary Cardiologist: [name], MD Electrophysiologist: Lewayne Bunting, MD   Subjective   NAEO. AT intermittently by device interrogation. The patient is doing well today.  At this time, the patient denies chest pain, shortness of breath, or any new concerns.  Inpatient Medications    Scheduled Meds:  amiodarone  400 mg Oral BID   aspirin EC  81 mg Oral Daily   atorvastatin  10 mg Oral Daily   Chlorhexidine Gluconate Cloth  6 each Topical Daily   cholecalciferol  1,000 Units Oral Daily   loratadine  10 mg Oral Daily   mouth rinse  15 mL Mouth Rinse BID   mupirocin ointment  1 application Topical BID   pantoprazole  40 mg Oral Daily   polyethylene glycol  17 g Oral QHS   polyvinyl alcohol  1 drop Both Eyes BID   sodium chloride flush  10-40 mL Intracatheter Q12H   sodium chloride flush  3 mL Intravenous Q12H   triamcinolone ointment  1 application Topical BID   vitamin B-12  2,000 mcg Oral Daily   Continuous Infusions:  sodium chloride 500 mL (03/05/2021 1555)   PRN Meds: sodium chloride, acetaminophen, ondansetron (ZOFRAN) IV, sodium chloride flush, sodium chloride flush   Vital Signs    Vitals:   02/11/21 0300 02/11/21 0400 02/11/21 0500 02/11/21 0600  BP: 95/69 100/77 110/77 102/73  Pulse: (!) 119 (!) 119 (!) 114 (!) 117  Resp: (!) 29 (!) 33 (!) 29 (!) 28  Temp:  99.1 F (37.3 C)    TempSrc:  Axillary    SpO2: 97% 96% 96% 95%  Weight:   58.5 kg   Height:        Intake/Output Summary (Last 24 hours) at 02/11/2021 0839 Last data filed at 02/11/2021 0600 Gross per 24 hour  Intake 579.77 ml  Output 235 ml  Net 344.77 ml   Filed Weights   02/28/2021 1519 02/11/21 0500  Weight: 58.9 kg 58.5 kg    Physical Exam    GEN- The patient is elderly appearing, alert and oriented x 3 today.   Head- normocephalic, atraumatic Eyes-  Sclera clear, conjunctiva pink Ears-  hearing intact Oropharynx- clear Neck- supple Lungs- Clear to ausculation bilaterally, normal work of breathing Heart- Regular rate and rhythm, no murmurs, rubs or gallops GI- soft, NT, ND, + BS Extremities- no clubbing or cyanosis. No edema Skin- no rash or lesion Psych- euthymic mood, full affect Neuro- strength and sensation are intact  Labs    CBC Recent Labs    02/26/2021 0134 02/11/21 0117  WBC 10.3 12.7*  HGB 10.1* 10.7*  HCT 30.9* 32.2*  MCV 106.6* 105.6*  PLT 132* 110*   Basic Metabolic Panel Recent Labs    09/98/33 1500 03/01/2021 1536 02/25/2021 0134  NA 137 138 136  K 3.9 3.9 4.2  CL 107 105 106  CO2 17*  --  19*  GLUCOSE 143* 140* 140*  BUN 63* 71* 60*  CREATININE 1.88* 1.80* 1.74*  CALCIUM 8.4*  --  8.9  MG 1.3*  --  2.2   Liver Function Tests No results for input(s): AST, ALT, ALKPHOS, BILITOT, PROT, ALBUMIN in the last 72 hours. No results for input(s): LIPASE, AMYLASE in the last 72 hours. Cardiac Enzymes No results for input(s): CKTOTAL, CKMB, CKMBINDEX, TROPONINI in the last 72 hours.   Telemetry    NSR, at times AT Rates 70-120s (  personally reviewed)  Radiology    CARDIAC CATHETERIZATION  Result Date: 03/03/2021 Successful placement of temporary transvenous pacemaker via right internal jugular vein.   EP PPM/ICD IMPLANT  Result Date: 26-Feb-2021 Conclusion: Successful though very difficult insertion of a biventricular pacemaker after initial attempts to place a left bundle area pacing lead were unsuccessful.  The patient tolerated the procedure well.  He had no underlying escape rhythm after the end of the procedure. Lewayne Bunting, MD   DG Chest Portable 1 View  Result Date: 02/28/2021 CLINICAL DATA:  Syncope. EXAM: PORTABLE CHEST 1 VIEW COMPARISON:  11/11/2020 FINDINGS: Sternotomy wires unchanged. Lungs are adequately inflated demonstrate hazy opacification of the perihilar vessels likely mild vascular congestion. No significant effusion.  Cardiomediastinal silhouette and remainder of the exam is unchanged. IMPRESSION: Mild vascular congestion. Electronically Signed   By: Elberta Fortis M.D.   On: 03/10/2021 16:04   ECHOCARDIOGRAM COMPLETE  Result Date: 26-Feb-2021    ECHOCARDIOGRAM REPORT   Patient Name:   Karl Stevenson Date of Exam: 02/26/2021 Medical Rec #:  [id number]       Height:       63.0 in Accession #:    [id number]      Weight:       129.9 lb Date of Birth:  02-Jul-1926      BSA:          1.609 m Patient Age:    87 years        BP:           109/69 mmHg Patient Gender: M               HR:           59 bpm. Exam Location:  Inpatient Procedure: 2D Echo, Cardiac Doppler and Color Doppler Indications:    Syncope  History:        Patient has prior history of Echocardiogram examinations, most                 recent 11/19/2015. Cardiomyopathy and CHF, CAD and Previous                 Myocardial Infarction, Prior CABG, Carotid Disease and COPD,                 Arrythmias:Bradycardia; Risk Factors:Hypertension and                 Dyslipidemia.  Sonographer:    Lavenia Atlas RDCS Referring Phys: [phone] PETER M [place] IMPRESSIONS  1. Global hypokinesis with akinesis of the inferolateral wall and apex; overall severe LV dysfunction.  2. Left ventricular ejection fraction, by estimation, is 20 to 25%. The left ventricle has severely decreased function. The left ventricle demonstrates regional wall motion abnormalities (see scoring diagram/findings for description). Left ventricular diastolic parameters are consistent with Grade III diastolic dysfunction (restrictive).  3. Right ventricular systolic function is normal. The right ventricular size is normal. There is moderately elevated pulmonary artery systolic pressure.  4. The mitral valve is normal in structure. Mild mitral valve regurgitation. No evidence of mitral stenosis.  5. The aortic valve is tricuspid. Aortic valve regurgitation is not visualized. No aortic stenosis is present.  6. The  inferior vena cava is dilated in size with <50% respiratory variability, suggesting right atrial pressure of 15 mmHg. FINDINGS  Left Ventricle: Left ventricular ejection fraction, by estimation, is 20 to 25%. The left ventricle has severely decreased function. The left ventricle demonstrates regional wall motion abnormalities.  The left ventricular internal cavity size was normal  in size. There is no left ventricular hypertrophy. Left ventricular diastolic parameters are consistent with Grade III diastolic dysfunction (restrictive). Right Ventricle: The right ventricular size is normal. Right ventricular systolic function is normal. There is moderately elevated pulmonary artery systolic pressure. The tricuspid regurgitant velocity is 2.93 m/s, and with an assumed right atrial pressure of 15 mmHg, the estimated right ventricular systolic pressure is 49.3 mmHg. Left Atrium: Left atrial size was normal in size. Right Atrium: Right atrial size was normal in size. Pericardium: There is no evidence of pericardial effusion. Mitral Valve: The mitral valve is normal in structure. Mild mitral annular calcification. Mild mitral valve regurgitation. No evidence of mitral valve stenosis. Tricuspid Valve: The tricuspid valve is normal in structure. Tricuspid valve regurgitation is mild . No evidence of tricuspid stenosis. Aortic Valve: The aortic valve is tricuspid. Aortic valve regurgitation is not visualized. No aortic stenosis is present. Pulmonic Valve: The pulmonic valve was normal in structure. Pulmonic valve regurgitation is trivial. No evidence of pulmonic stenosis. Aorta: The aortic root is normal in size and structure. Venous: The inferior vena cava is dilated in size with less than 50% respiratory variability, suggesting right atrial pressure of 15 mmHg. IAS/Shunts: No atrial level shunt detected by color flow Doppler. Additional Comments: Global hypokinesis with akinesis of the inferolateral wall and apex; overall  severe LV dysfunction.  LEFT VENTRICLE PLAX 2D LVIDd:         5.20 cm      Diastology LVIDs:         4.60 cm      LV e' lateral:   4.46 cm/s LV PW:         1.00 cm      LV E/e' lateral: 25.3 LV IVS:        0.80 cm LVOT diam:     2.10 cm LV SV:         42 LV SV Index:   26 LVOT Area:     3.46 cm  LV Volumes (MOD) LV vol d, MOD A4C: 143.0 ml LV vol s, MOD A4C: 96.4 ml LV SV MOD A4C:     143.0 ml RIGHT VENTRICLE RV Basal diam:  3.10 cm TAPSE (M-mode): 1.7 cm LEFT ATRIUM             Index       RIGHT ATRIUM           Index LA diam:        3.30 cm 2.05 cm/m  RA Area:     14.30 cm LA Vol (A2C):   54.1 ml 33.62 ml/m RA Volume:   38.40 ml  23.86 ml/m LA Vol (A4C):   48.1 ml 29.89 ml/m LA Biplane Vol: 51.1 ml 31.75 ml/m  AORTIC VALVE LVOT Vmax:   72.50 cm/s LVOT Vmean:  44.300 cm/s LVOT VTI:    0.122 m  AORTA Ao Root diam: 2.80 cm MITRAL VALVE                TRICUSPID VALVE MV Area (PHT): 12.64 cm    TR Peak grad:   34.3 mmHg MV Decel Time: 60 msec      TR Vmax:        293.00 cm/s MV E velocity: 113.00 cm/s MV A velocity: 136.00 cm/s  SHUNTS MV E/A ratio:  0.83         Systemic VTI:  0.12 m  Systemic Diam: 2.10 cm Olga Millers MD Electronically signed by Olga Millers MD Signature Date/Time: 02/15/2021/12:38:10 PM    Final     Patient Profile     Karl Stevenson is a 86 y.o. male with a history of complicated cardiac history including AAA, bradycardia (previously intolerant to BB and now off), Carotid artery disese s/p LCEA, Ischemic DCM with chronic systolic CHF (EF 17-51% on echo 2017), ASCAD s/p remote MI in1969 with CABG 1987 and redo 2008 and last cath 02/2015 with distal LM occlusion, occluded oLAD, occluded oLCx and OME with patent SVG to OM3>OM4, patent SVG to LAD and occluded SVG to OM2 on medical management.  He also has a hx of HTN, HLD, PVD with RAS, chronic RBBB and ischemic VT (initially on Amio and changed to Mexilitene and Ranexa. He is being seen today for the  evaluation of Syncope and Advanced AV block at the request of Dr. Mayford Knife.  Assessment & Plan    1.  Advanced AV block with Syncope s/p Medtronic BiV PPM Stable device interrogation Stable CXR  2. ICM Echo 11/2015 LVEF 30-35%, Repeat pending LHC 02/2015 with severe native CAD with no PCI targets Continue to hold plavix.  SCDs. Ambulate as tolerated. Will have PT see   3. H/o VT Start amiodarone 400 mg BID this am if tolerates. Will taper down rapidly over next week to several days.  Consider low dose BB if BP will tolerate.    4. PAD Hx of AAA with thrombus Hx of LCEA with 1-39% B/L stenosis 09/2020 Mild LLE arterial disease by doplers 09/2020   5. Chronic foot wound Treated for osteomyelitis as recently as July.  Wound stable per recent wound visits.   Move to floor. Home later this week once rates and BP stable.   For questions or updates, please contact [facility] Please consult [email or web address] for contact info under Cardiology/STEMI.  Signed, Graciella Freer, PA-C  02/11/2021, 8:39 AM   EP Attending  Patient seen and examined. Agree with above. The patient is stable after  biv PPM insertion. Device interogation under my direction demonstrates normal biv PM function. His CXR demonstrates satisfactory lead position. He will be observed today with possible DC home tomorrow if stable.   Sharlot Gowda Sesilia Poucher,MD

## 2021-02-11 NOTE — Progress Notes (Addendum)
  Paged to see patient with wide complex tachycardia at 135 with BP in 80s systolic  Tele reviewed. It is unclear, but at initial glance appears to have either had pacing on an R wave leading to a re-entrant VT OR undersensing of R wave with ventricular pacing spike with NON capture and retrograde p waves.    On device interrogation, adjusting RV sensing initially eliminates his "couplet" of pacing followed by re-entrant rhythm.      Initially, his AS-VS appeared stacked, but on closer examination appear to be 1:1.  This was further confirmed by atrial burst pacing at 400 ms which showed which showed intermittent V-sensing, and termination of his re-entrant rhythm.   Pt HRs now in 100s and BP improved.   Reviewed with Dr. Ladona Ridgel. It is our hope that as his amiodarone get's into his system his arrhythmias will improve.   Will update his BMET/Mg.   Karl Stevenson [address]" [place], PA-C  02/11/2021 3:31 PM   Dorathy Daft

## 2021-02-12 ENCOUNTER — Inpatient Hospital Stay: Payer: Self-pay

## 2021-02-12 ENCOUNTER — Inpatient Hospital Stay (HOSPITAL_COMMUNITY): Payer: Medicare PPO

## 2021-02-12 DIAGNOSIS — Z7189 Other specified counseling: Secondary | ICD-10-CM

## 2021-02-12 DIAGNOSIS — R570 Cardiogenic shock: Secondary | ICD-10-CM

## 2021-02-12 DIAGNOSIS — I5022 Chronic systolic (congestive) heart failure: Secondary | ICD-10-CM | POA: Diagnosis not present

## 2021-02-12 DIAGNOSIS — I442 Atrioventricular block, complete: Secondary | ICD-10-CM | POA: Diagnosis not present

## 2021-02-12 DIAGNOSIS — I472 Ventricular tachycardia, unspecified: Secondary | ICD-10-CM

## 2021-02-12 DIAGNOSIS — R579 Shock, unspecified: Secondary | ICD-10-CM | POA: Diagnosis not present

## 2021-02-12 DIAGNOSIS — Z950 Presence of cardiac pacemaker: Secondary | ICD-10-CM

## 2021-02-12 LAB — PROCALCITONIN
Procalcitonin: 0.74 ng/mL
Procalcitonin: 0.78 ng/mL

## 2021-02-12 LAB — CBC
HCT: 30.9 % — ABNORMAL LOW (ref 39.0–52.0)
Hemoglobin: 10 g/dL — ABNORMAL LOW (ref 13.0–17.0)
MCH: 34.6 pg — ABNORMAL HIGH (ref 26.0–34.0)
MCHC: 32.4 g/dL (ref 30.0–36.0)
MCV: 106.9 fL — ABNORMAL HIGH (ref 80.0–100.0)
Platelets: 84 10*3/uL — ABNORMAL LOW (ref 150–400)
RBC: 2.89 MIL/uL — ABNORMAL LOW (ref 4.22–5.81)
RDW: 13.4 % (ref 11.5–15.5)
WBC: 11.5 10*3/uL — ABNORMAL HIGH (ref 4.0–10.5)
nRBC: 0.9 % — ABNORMAL HIGH (ref 0.0–0.2)

## 2021-02-12 LAB — BLOOD GAS, ARTERIAL
Acid-base deficit: 1.2 mmol/L (ref 0.0–2.0)
Bicarbonate: 21.4 mmol/L (ref 20.0–28.0)
Drawn by: 511331
FIO2: 36
O2 Saturation: 99.4 %
Patient temperature: 36.6
pCO2 arterial: 25.9 mmHg — ABNORMAL LOW (ref 32.0–48.0)
pH, Arterial: 7.525 — ABNORMAL HIGH (ref 7.350–7.450)
pO2, Arterial: 136 mmHg — ABNORMAL HIGH (ref 83.0–108.0)

## 2021-02-12 LAB — HEPATIC FUNCTION PANEL
ALT: 1837 U/L — ABNORMAL HIGH (ref 0–44)
AST: 2033 U/L — ABNORMAL HIGH (ref 15–41)
Albumin: 2.8 g/dL — ABNORMAL LOW (ref 3.5–5.0)
Alkaline Phosphatase: 101 U/L (ref 38–126)
Bilirubin, Direct: 0.3 mg/dL — ABNORMAL HIGH (ref 0.0–0.2)
Indirect Bilirubin: 0.7 mg/dL (ref 0.3–0.9)
Total Bilirubin: 1 mg/dL (ref 0.3–1.2)
Total Protein: 5.4 g/dL — ABNORMAL LOW (ref 6.5–8.1)

## 2021-02-12 LAB — BASIC METABOLIC PANEL
Anion gap: 16 — ABNORMAL HIGH (ref 5–15)
Anion gap: 17 — ABNORMAL HIGH (ref 5–15)
BUN: 95 mg/dL — ABNORMAL HIGH (ref 8–23)
BUN: 94 mg/dL — ABNORMAL HIGH (ref 8–23)
CO2: 11 mmol/L — ABNORMAL LOW (ref 22–32)
CO2: 15 mmol/L — ABNORMAL LOW (ref 22–32)
Calcium: 8.1 mg/dL — ABNORMAL LOW (ref 8.9–10.3)
Calcium: 7.9 mg/dL — ABNORMAL LOW (ref 8.9–10.3)
Chloride: 107 mmol/L (ref 98–111)
Chloride: 103 mmol/L (ref 98–111)
Creatinine, Ser: 2.51 mg/dL — ABNORMAL HIGH (ref 0.61–1.24)
Creatinine, Ser: 2.48 mg/dL — ABNORMAL HIGH (ref 0.61–1.24)
GFR, Estimated: 24 mL/min — ABNORMAL LOW (ref >60)
GFR, Estimated: 24 mL/min — ABNORMAL LOW (ref >60)
Glucose, Bld: 95 mg/dL (ref 70–99)
Glucose, Bld: 140 mg/dL — ABNORMAL HIGH (ref 70–99)
Potassium: 4.6 mmol/L (ref 3.5–5.1)
Potassium: 4.4 mmol/L (ref 3.5–5.1)
Sodium: 134 mmol/L — ABNORMAL LOW (ref 135–145)
Sodium: 135 mmol/L (ref 135–145)

## 2021-02-12 LAB — COOXEMETRY PANEL
Carboxyhemoglobin: 0.5 % (ref 0.5–1.5)
Carboxyhemoglobin: 0.8 % (ref 0.5–1.5)
Carboxyhemoglobin: 0.6 % (ref 0.5–1.5)
Methemoglobin: 0.9 % (ref 0.0–1.5)
Methemoglobin: 0.7 % (ref 0.0–1.5)
Methemoglobin: 1 % (ref 0.0–1.5)
O2 Saturation: 28.2 %
O2 Saturation: 43.9 %
O2 Saturation: 55.7 %
Total hemoglobin: 9.3 g/dL — ABNORMAL LOW (ref 12.0–16.0)
Total hemoglobin: 9.4 g/dL — ABNORMAL LOW (ref 12.0–16.0)
Total hemoglobin: 9.1 g/dL — ABNORMAL LOW (ref 12.0–16.0)

## 2021-02-12 LAB — LACTIC ACID, PLASMA
Lactic Acid, Venous: 5.6 mmol/L (ref 0.5–1.9)
Lactic Acid, Venous: 6 mmol/L (ref 0.5–1.9)
Lactic Acid, Venous: 3.9 mmol/L (ref 0.5–1.9)

## 2021-02-12 MED ORDER — SODIUM CHLORIDE 0.9 % IV SOLN
INTRAVENOUS | Status: DC | PRN
  Administered 2021-02-12: 250 mL via INTRAVENOUS

## 2021-02-12 MED ORDER — SODIUM CHLORIDE 0.9% FLUSH: 10.0000 mL | INTRAVENOUS | Status: DC | PRN

## 2021-02-12 MED ORDER — SODIUM CHLORIDE 0.9 % IV SOLN
2.0000 g | INTRAVENOUS | Status: DC
  Administered 2021-02-13: 2 g via INTRAVENOUS
  Filled 2021-02-12: qty 2

## 2021-02-12 MED ORDER — AMIODARONE HCL IN DEXTROSE 360-4.14 MG/200ML-% IV SOLN: 30.0000 mg/h | INTRAVENOUS | Status: DC

## 2021-02-12 MED ORDER — MORPHINE SULFATE (PF) 2 MG/ML IV SOLN
2.0000 mg | INTRAVENOUS | Status: DC | PRN
  Administered 2021-02-13 (×3): 2 mg via INTRAVENOUS
  Administered 2021-02-13: 4 mg via INTRAVENOUS
  Administered 2021-02-13 (×3): 2 mg via INTRAVENOUS
  Filled 2021-02-12 (×2): qty 1

## 2021-02-12 MED ORDER — VANCOMYCIN VARIABLE DOSE PER UNSTABLE RENAL FUNCTION (PHARMACIST DOSING): Status: DC

## 2021-02-12 MED ORDER — MILRINONE LACTATE IN DEXTROSE 20-5 MG/100ML-% IV SOLN
0.2500 ug/kg/min | INTRAVENOUS | Status: DC
  Administered 2021-02-12 – 2021-02-13 (×2): 0.25 ug/kg/min via INTRAVENOUS
  Filled 2021-02-12 (×2): qty 100

## 2021-02-12 MED ORDER — SODIUM BICARBONATE 8.4 % IV SOLN
50.0000 meq | Freq: Once | INTRAVENOUS | Status: AC
  Administered 2021-02-12: 50 meq via INTRAVENOUS
  Filled 2021-02-12: qty 50

## 2021-02-12 MED ORDER — SODIUM BICARBONATE 650 MG PO TABS
650.0000 mg | ORAL_TABLET | Freq: Four times a day (QID) | ORAL | Status: DC
  Administered 2021-02-12 – 2021-02-13 (×5): 650 mg via ORAL
  Filled 2021-02-12 (×5): qty 1

## 2021-02-12 MED ORDER — MORPHINE 100MG IN NS 100ML (1MG/ML) PREMIX INFUSION
2.0000 mg/h | INTRAVENOUS | Status: DC
  Administered 2021-02-12: 2 mg/h via INTRAVENOUS
  Administered 2021-02-13 – 2021-02-14 (×2): 6 mg/h via INTRAVENOUS
  Filled 2021-02-12 (×3): qty 100

## 2021-02-12 MED ORDER — STERILE WATER FOR INJECTION IV SOLN: INTRAVENOUS | Status: DC

## 2021-02-12 MED ORDER — AMIODARONE HCL IN DEXTROSE 360-4.14 MG/200ML-% IV SOLN
INTRAVENOUS | Status: AC
  Filled 2021-02-12: qty 200

## 2021-02-12 MED ORDER — MIDODRINE HCL 5 MG PO TABS
10.0000 mg | ORAL_TABLET | Freq: Three times a day (TID) | ORAL | Status: DC
  Administered 2021-02-12 – 2021-02-13 (×4): 10 mg via ORAL
  Filled 2021-02-12 (×4): qty 2

## 2021-02-12 MED ORDER — SODIUM BICARBONATE 8.4 % IV SOLN: 50.0000 meq | Freq: Once | INTRAVENOUS | Status: AC

## 2021-02-12 MED ORDER — OXYCODONE HCL 5 MG PO TABS
10.0000 mg | ORAL_TABLET | ORAL | Status: DC | PRN
Start: 2021-02-12 — End: 2021-02-13
  Administered 2021-02-12: 10 mg via ORAL
  Filled 2021-02-12: qty 2

## 2021-02-12 MED ORDER — SODIUM CHLORIDE 0.9 % IV SOLN: Freq: Once | INTRAVENOUS | Status: AC

## 2021-02-12 MED ORDER — AMIODARONE IV BOLUS ONLY 150 MG/100ML
150.0000 mg | Freq: Once | INTRAVENOUS | Status: AC
  Administered 2021-02-12: 150 mg via INTRAVENOUS
  Filled 2021-02-12: qty 100

## 2021-02-12 MED ORDER — MORPHINE SULFATE (PF) 4 MG/ML IV SOLN
4.0000 mg | INTRAVENOUS | Status: DC | PRN
  Administered 2021-02-12 – 2021-02-13 (×2): 4 mg via INTRAVENOUS
  Filled 2021-02-12 (×2): qty 1

## 2021-02-12 MED ORDER — AMIODARONE HCL IN DEXTROSE 360-4.14 MG/200ML-% IV SOLN
30.0000 mg/h | INTRAVENOUS | Status: DC
  Administered 2021-02-12 – 2021-02-13 (×4): 30 mg/h via INTRAVENOUS
  Filled 2021-02-12 (×3): qty 200

## 2021-02-12 MED ORDER — NOREPINEPHRINE 4 MG/250ML-% IV SOLN: 0.0000 ug/min | INTRAVENOUS | Status: DC

## 2021-02-12 MED ORDER — SODIUM CHLORIDE 0.9% FLUSH
10.0000 mL | Freq: Two times a day (BID) | INTRAVENOUS | Status: DC
  Administered 2021-02-12: 10 mL

## 2021-02-12 MED ORDER — SODIUM CHLORIDE 0.9 % IV SOLN
2.0000 g | INTRAVENOUS | Status: AC
  Administered 2021-02-12: 2 g via INTRAVENOUS
  Filled 2021-02-12: qty 2

## 2021-02-12 MED ORDER — OXYCODONE HCL 5 MG PO TABS
5.0000 mg | ORAL_TABLET | ORAL | Status: DC | PRN
  Administered 2021-02-12: 5 mg via ORAL
  Filled 2021-02-12: qty 1

## 2021-02-12 MED ORDER — ONDANSETRON HCL 4 MG/2ML IJ SOLN: 4.0000 mg | Freq: Four times a day (QID) | INTRAMUSCULAR | Status: DC

## 2021-02-12 NOTE — Progress Notes (Addendum)
Electrophysiology Rounding Note  Patient Name: RHETT MUTSCHLER Date of Encounter: 02/12/2021  Primary Cardiologist: [name], MD Electrophysiologist: Dr. Graciela Husbands   Subjective   Events from overnight noted. Symptomatic tachy-arrhythmias with hypotension and elevated lactic acidosis leading to starting broad spectrum ABX.  The patient is not in acute distress currently. Complains of "pins and needles" in his hands. Says started yesterday afternoon. Denies SOB or CP  Inpatient Medications    Scheduled Meds:  aspirin EC  81 mg Oral Daily   atorvastatin  10 mg Oral Daily   Chlorhexidine Gluconate Cloth  6 each Topical Daily   cholecalciferol  1,000 Units Oral Daily   loratadine  10 mg Oral Daily   mouth rinse  15 mL Mouth Rinse BID   midodrine  10 mg Oral TID WC   mupirocin ointment  1 application Topical BID   pantoprazole  40 mg Oral Daily   polyethylene glycol  17 g Oral QHS   polyvinyl alcohol  1 drop Both Eyes BID   sodium bicarbonate  650 mg Oral QID   sodium chloride flush  10-40 mL Intracatheter Q12H   sodium chloride flush  3 mL Intravenous Q12H   triamcinolone ointment  1 application Topical BID   vancomycin variable dose per unstable renal function (pharmacist dosing)   Does not apply See admin instructions   vitamin B-12  2,000 mcg Oral Daily   Continuous Infusions:  sodium chloride 500 mL (03/09/2021 1555)   amiodarone 30 mg/hr (02/12/21 0240)   [START ON 02/13/2021] ceFEPime (MAXIPIME) IV     PRN Meds: sodium chloride, acetaminophen, ondansetron (ZOFRAN) IV, sodium chloride flush, sodium chloride flush   Vital Signs    Vitals:   02/12/21 0500 02/12/21 0515 02/12/21 0530 02/12/21 0545  BP: 102/68 99/70    Pulse: 95 93  91  Resp: (!) 21 (!) 24 (!) 26 (!) 27  Temp:      TempSrc:      SpO2: 95% 97%  100%  Weight:      Height:        Intake/Output Summary (Last 24 hours) at 02/12/2021 0735 Last data filed at 02/12/2021 0501 Gross per 24 hour  Intake  1370.08 ml  Output 125 ml  Net 1245.08 ml   Filed Weights   02-11-2021 1519 02/11/21 0500 02/11/21 2240  Weight: 58.9 kg 58.5 kg 59.2 kg    Physical Exam    GEN- The patient is elderly and chronically ill appearing, alert and oriented x 3 today.   Head- normocephalic, atraumatic Eyes-  Sclera clear, conjunctiva pink Ears- hearing intact Oropharynx- clear Neck- supple Lungs- Clear to ausculation bilaterally, normal work of breathing Heart- Regular rate and rhythm, no murmurs, rubs or gallops GI- soft, NT, ND, + BS Extremities- no clubbing or cyanosis. No edema Skin- no rash or lesion Psych- flat but appropriate full affect Neuro- strength and sensation are intact  Labs    CBC Recent Labs    02/11/21 0117 02/12/21 0210  WBC 12.7* 11.5*  HGB 10.7* 10.0*  HCT 32.2* 30.9*  MCV 105.6* 106.9*  PLT 110* 84*   Basic Metabolic Panel Recent Labs    62/70/35 0134 02/11/21 1602 02/12/21 0210  NA 136 136 134*  K 4.2 5.2* 4.6  CL 106 107 107  CO2 19* 15* 11*  GLUCOSE 140* 112* 95  BUN 60* 90* 95*  CREATININE 1.74* 2.57* 2.51*  CALCIUM 8.9 8.3* 8.1*  MG 2.2 2.1  --  Liver Function Tests No results for input(s): AST, ALT, ALKPHOS, BILITOT, PROT, ALBUMIN in the last 72 hours. No results for input(s): LIPASE, AMYLASE in the last 72 hours. Cardiac Enzymes No results for input(s): CKTOTAL, CKMB, CKMBINDEX, TROPONINI in the last 72 hours.   Telemetry    SVT appears possible AVNRT in 120s as well as NSR/Sinus tach with V pacing 90s primarily since starting on amiodarone. (personally reviewed)  Radiology    EP PPM/ICD IMPLANT  Result Date: 03/05/2021 Conclusion: Successful though very difficult insertion of a biventricular pacemaker after initial attempts to place a left bundle area pacing lead were unsuccessful.  The patient tolerated the procedure well.  He had no underlying escape rhythm after the end of the procedure. Lewayne Bunting, MD   DG CHEST PORT 1  VIEW  Result Date: 02/12/2021 CLINICAL DATA:  Tachycardia EXAM: PORTABLE CHEST 1 VIEW COMPARISON:  02/11/2021 FINDINGS: Cardiac shadow is stable. Pacing device is again seen and stable. Postsurgical changes are noted. Small pleural effusions are noted bilaterally. The degree of vascular congestion has improved somewhat in the interval from the prior exam. Skin folds are noted over the right chest. Mild fluid in the minor fissure is noted as well. No bony abnormality is seen. IMPRESSION: Bilateral effusions right greater than left. Improvement in the degree of vascular congestion when compared with the prior study. Electronically Signed   By: Alcide Clever M.D.   On: 02/12/2021 02:57   DG Chest Port 1 View  Result Date: 02/11/2021 CLINICAL DATA:  Sore arm and weakness post pacer placement EXAM: PORTABLE CHEST 1 VIEW COMPARISON:  14-Feb-2021 FINDINGS: LEFT-sided pacer overlies normal cardiac silhouette. Midline sternotomy. Small RIGHT effusion noted. Mild central venous congestion. No pneumothorax. IMPRESSION: LEFT-sided pacer placement without complication RIGHT effusion and  central venous congestion. Electronically Signed   By: Genevive Bi M.D.   On: 02/11/2021 09:17   ECHOCARDIOGRAM COMPLETE  Result Date: 03/05/2021    ECHOCARDIOGRAM REPORT   Patient Name:   WILVER TIGNOR Date of Exam: 03/03/2021 Medical Rec #:  [id number]       Height:       63.0 in Accession #:    [id number]      Weight:       129.9 lb Date of Birth:  Sep 13, 1926      BSA:          1.609 m Patient Age:    87 years        BP:           109/69 mmHg Patient Gender: M               HR:           59 bpm. Exam Location:  Inpatient Procedure: 2D Echo, Cardiac Doppler and Color Doppler Indications:    Syncope  History:        Patient has prior history of Echocardiogram examinations, most                 recent 11/19/2015. Cardiomyopathy and CHF, CAD and Previous                 Myocardial Infarction, Prior CABG, Carotid Disease and COPD,                  Arrythmias:Bradycardia; Risk Factors:Hypertension and                 Dyslipidemia.  Sonographer:    Lavenia Atlas RDCS Referring Phys: [phone]  M [place] IMPRESSIONS  1. Global hypokinesis with akinesis of the inferolateral wall and apex; overall severe LV dysfunction.  2. Left ventricular ejection fraction, by estimation, is 20 to 25%. The left ventricle has severely decreased function. The left ventricle demonstrates regional wall motion abnormalities (see scoring diagram/findings for description). Left ventricular diastolic parameters are consistent with Grade III diastolic dysfunction (restrictive).  3. Right ventricular systolic function is normal. The right ventricular size is normal. There is moderately elevated pulmonary artery systolic pressure.  4. The mitral valve is normal in structure. Mild mitral valve regurgitation. No evidence of mitral stenosis.  5. The aortic valve is tricuspid. Aortic valve regurgitation is not visualized. No aortic stenosis is present.  6. The inferior vena cava is dilated in size with <50% respiratory variability, suggesting right atrial pressure of 15 mmHg. FINDINGS  Left Ventricle: Left ventricular ejection fraction, by estimation, is 20 to 25%. The left ventricle has severely decreased function. The left ventricle demonstrates regional wall motion abnormalities. The left ventricular internal cavity size was normal  in size. There is no left ventricular hypertrophy. Left ventricular diastolic parameters are consistent with Grade III diastolic dysfunction (restrictive). Right Ventricle: The right ventricular size is normal. Right ventricular systolic function is normal. There is moderately elevated pulmonary artery systolic pressure. The tricuspid regurgitant velocity is 2.93 m/s, and with an assumed right atrial pressure of 15 mmHg, the estimated right ventricular systolic pressure is 49.3 mmHg. Left Atrium: Left atrial size was normal in size. Right Atrium:  Right atrial size was normal in size. Pericardium: There is no evidence of pericardial effusion. Mitral Valve: The mitral valve is normal in structure. Mild mitral annular calcification. Mild mitral valve regurgitation. No evidence of mitral valve stenosis. Tricuspid Valve: The tricuspid valve is normal in structure. Tricuspid valve regurgitation is mild . No evidence of tricuspid stenosis. Aortic Valve: The aortic valve is tricuspid. Aortic valve regurgitation is not visualized. No aortic stenosis is present. Pulmonic Valve: The pulmonic valve was normal in structure. Pulmonic valve regurgitation is trivial. No evidence of pulmonic stenosis. Aorta: The aortic root is normal in size and structure. Venous: The inferior vena cava is dilated in size with less than 50% respiratory variability, suggesting right atrial pressure of 15 mmHg. IAS/Shunts: No atrial level shunt detected by color flow Doppler. Additional Comments: Global hypokinesis with akinesis of the inferolateral wall and apex; overall severe LV dysfunction.  LEFT VENTRICLE PLAX 2D LVIDd:         5.20 cm      Diastology LVIDs:         4.60 cm      LV e' lateral:   4.46 cm/s LV PW:         1.00 cm      LV E/e' lateral: 25.3 LV IVS:        0.80 cm LVOT diam:     2.10 cm LV SV:         42 LV SV Index:   26 LVOT Area:     3.46 cm  LV Volumes (MOD) LV vol d, MOD A4C: 143.0 ml LV vol s, MOD A4C: 96.4 ml LV SV MOD A4C:     143.0 ml RIGHT VENTRICLE RV Basal diam:  3.10 cm TAPSE (M-mode): 1.7 cm LEFT ATRIUM             Index       RIGHT ATRIUM           Index LA diam:  3.30 cm 2.05 cm/m  RA Area:     14.30 cm LA Vol (A2C):   54.1 ml 33.62 ml/m RA Volume:   38.40 ml  23.86 ml/m LA Vol (A4C):   48.1 ml 29.89 ml/m LA Biplane Vol: 51.1 ml 31.75 ml/m  AORTIC VALVE LVOT Vmax:   72.50 cm/s LVOT Vmean:  44.300 cm/s LVOT VTI:    0.122 m  AORTA Ao Root diam: 2.80 cm MITRAL VALVE                TRICUSPID VALVE MV Area (PHT): 12.64 cm    TR Peak grad:   34.3 mmHg  MV Decel Time: 60 msec      TR Vmax:        293.00 cm/s MV E velocity: 113.00 cm/s MV A velocity: 136.00 cm/s  SHUNTS MV E/A ratio:  0.83         Systemic VTI:  0.12 m                             Systemic Diam: 2.10 cm Olga Millers MD Electronically signed by Olga Millers MD Signature Date/Time: 2021/02/17/12:38:10 PM    Final     Patient Profile     JARRON CURLEY is a 87 y.o. male with a history of complicated cardiac history including AAA, bradycardia (previously intolerant to BB and now off), Carotid artery disese s/p LCEA, Ischemic DCM with chronic systolic CHF (EF 75-64% on echo 2017), ASCAD s/p remote MI in1969 with CABG 1987 and redo 2008 and last cath 02/2015 with distal LM occlusion, occluded oLAD, occluded oLCx and OME with patent SVG to OM3>OM4, patent SVG to LAD and occluded SVG to OM2 on medical management.  He also has a hx of HTN, HLD, PVD with RAS, chronic RBBB and ischemic VT (initially on Amio and changed to Mexilitene and Ranexa. He is being seen today for the evaluation of Syncope and Advanced AV block at the request of Dr. Mayford Knife.  Assessment & Plan    1.  Advanced AV block with Syncope s/p [employer] BiV PPM Stable device interrogation Stable CXR There are rate related changes to his pacing leading to ? Fusion beats at times. Will review with industry for potential changes.    2. ICM Echo 11/2015 LVEF 30-35% -> 20-25% this admission  LHC 02/2015 with severe native CAD with no PCI targets Continue to hold plavix.  SCDs. Ambulate as tolerated. Will have PT see   3. H/o VT Now on IV amio with recurrent arrhythmias overnight. Potentially has multiple arrhythmias as yesterday was broken with ATRIAL pacing With concerns for low output, would not use BB at this juncture.    4. PAD Hx of AAA with thrombus Hx of LCEA with 1-39% B/L stenosis 09/2020 Mild LLE arterial disease by dopplers 09/2020   5. Chronic foot wound Treated for osteomyelitis as recently as July.  Wound  stable per recent wound visits.  Afebrile BCx pending with elevated LA  6. Lactic Acidosis Up to 6.0 this am IVF and broad spectrum ABX.  Concern for low output state as well.  7. Concern for low output state With paraesthesias in hands and cold feet PICC line pending for Coox.  8. GOC Pt and family have opted for full care but DNR. They do not want CPR, intubation, or shocks.   Overall picture is concerning for a primarily ischemic episode / NSTEMI leading to low output state.  Prognosis is guarded at this point.   For questions or updates, please contact [facility] Please consult [email or web address] for contact info under Cardiology/STEMI.  Signed, Graciella Freer, PA-C  02/12/2021, 7:35 AM   Shock-low output  co-Ox 28>>43; lactic acid >=6  Ischemic cardiomyopathy EF 20% or less  Non-STEMI  Acute/chronic heart failure  Acute chronic renal failure  Bradycardia  Pacemaker acute implant  The patient has evidence of hypoperfusion and shock.  Temporal relationship to pacemaker implantation raises the concern as to tamponade.  A quick look echo failed to identify significant effusion.  JVP is only about 10.  I am surprised also at the paucity of dyspnea even in the setting of acidosis and cyanosis.  I wonder if there is organ ischemia and potential infarction contributing to shock but in this regard his belly is soft and he denies significant pain.  I have reached out to Dr. [facility] to think with him about this patient to me know as well will also be by.  For now I would continue him on the milrinone as it was associated with significant interval improvement his coags.  He may benefit from norepinephrine.  We will wait for Dr. Baxter Hire input  Discussions this morning with CCM to clarify CODE STATUS and he is now been designated DNR.  In this regard, we had a discussion, if the trajectory appears to be inexorable towards dying, whether he would want to die at home or in the  hospital.  He is in favor of the former and if his dying becomes clear, do strongly recommend hospice consideration of discharge and home care.  Rhythm issues are continued confusing and I think he is having recurrent SVT with triggered ventricular pacing I do not think this is ventricular tachycardia

## 2021-02-12 NOTE — Consult Note (Signed)
NAME:  Karl Stevenson, MRN:  [id number], DOB:  [birth year], LOS: 3 ADMISSION DATE:  02/18/2021, CONSULTATION DATE: 06/15/2020 REFERRING MD: Allergy, CHIEF COMPLAINT: Multiple extremities hypotension.  History of Present Illness:  Karl Stevenson is a 87 year old male with extensive past medical history that is well-documented below.  He presented on 10 -2 with syncope and required temporary pacemaker via right IJ and subsequent left permanent pacemaker biventricular on 03-05-21 for bradycardia accelerated ventricular tachycardia arrhythmias.  He was being staged for discharge home in the near future when during the night of 02/12/2021 he became hypotensive and and developed low flow state with mottled hands/feet.  Pulmonary critical care was called to evaluate the patient on 02/12/2021 at 0800 hrs. and after discussion with the patient and son elected to be full care but with a DNR place.  We will place a PICC line check a Co. oximetry start inotropes if needed but we will not shock, perform CPR or intubate at this time.  He expressed understanding and agreed with a DNR status.  Pulmonary critical care will continue to follow along.  Pertinent  Medical History   Past Medical History:  Diagnosis Date   AAA (abdominal aortic aneurysm)    a. Ectatic abdominal aorta with fusiform dilitation 3.4x3.4 and moderate thrombus burden 12/2010   Bradycardia    a. previous intolerance to beta blockers - low dose toprol started 10/2011 for tachycardia but had rash and was discontinued;  b. tolerating low dose propranolol.   Carotid artery occlusion    a. s/p L CEA in 2010;  b. 06/2014 Carotid U/S: stable LICA CEA site w/o significant stenosis bilat.   Chronic systolic heart failure (HCC)    a. 02/2015 Echo:  35-40% (echo 11/2010); c. 02/2015 Echo: EF 20-25%, diff HK, mid-apicalanteroseptal and apical AK, Gr 1 DD, mild MR.   COPD (chronic obstructive pulmonary disease) (HCC)    Coronary artery disease    a. 1969 s/p MI;   b. S/P CABG 1987;  c. S/P redo 2008;  d. 07/2012 Cath: stable->Med Rx, EF 40%; e. 02/2014 MV: anterior and posterolateral/apical infarct with mild peri-infarct ischemia->Med Rx; f. 02/2015 Cath: LM 100d, LAD 100ost, LCX 100ost, OM2 100, VG->OM3->OM4 nl, VG-> LAD nl (Y from VG->OM), VG->OM2 100p.   Crohn's disease (HCC)    a. s/p colon resection   Gout    Hyperlipidemia    Hypertension    Ischemic cardiomyopathy    a. EF 34% (Lexiscan 06/2011); b. 35-40% (echo 11/2010); c. 02/2015 Echo: EF 20-25%, diff HK, mid-apicalanteroseptal and apical AK, Gr 1 DD, mild MR.   Peripheral vascular disease (HCC)    a. Carotid dz s/p L CEA, RAS, AAA, LE dz   RBBB    Renal artery stenosis (HCC)    a. Dopplers 12/2010 - 1-59% R ostial renal artery stenosis, 2 L renal arteries both widely patent   Syncope    Tachycardia    a. 10/2011 - atrial tach vs avnrt - BB started.   Ventricular tachycardia    a. 02/2015 ->amio 200 daily added-->later switched to mexilitene.     Significant Hospital Events: Including procedures, antibiotic start and stop dates in addition to other pertinent events     Interim History / Subjective:  Worsening hypertension accelerated ventricular tachycardia at times  Objective   Blood pressure 99/70, pulse 91, temperature 98 F (36.7 C), temperature source Oral, resp. rate (!) 27, height 5\' 3"  (1.6 m), weight 59.2 kg, SpO2 100 %.  Intake/Output Summary (Last 24 hours) at 02/12/2021 0828 Last data filed at 02/12/2021 0501 Gross per 24 hour  Intake 1130.08 ml  Output 125 ml  Net 1005.08 ml   Filed Weights   March 03, 2021 1519 02/11/21 0500 02/11/21 2240  Weight: 58.9 kg 58.5 kg 59.2 kg    Examination: General: Elderly male who is awake and interactive but hard of hearing HENT: No JVD or lymphadenopathy is appreciated Lungs: Decreased breath sounds throughout Cardiovascular: Heart sounds are regular paced beats are noted Abdomen: Soft nontender positive bowel  sounds Extremities: All extremities are cool distal upper or distal lower extremities are mottled wound on right great toe and on left elbow Neuro: Grossly intact without focal defect GU: Dark urine   Resolved Hospital Problem list     Assessment & Plan:  Hypotension in the setting of complex cardiac history worsening renal failure and apparent mild symptoms of extremities from low flow state.  Status post temporary pacing wire Mar 03, 2021 and permanent pacemaker placed on 03/08/2021 by electrophysiology which was a biventricular.  Despite interventions developed increasing shortness of breath general malaise and hypotension on 02/12/2021 pulmonary critical care consult. DNR established Place PICC line and obtain Co. ox to determine need for inotropic support Continue all other aggressive measures Check wounds to rule out possible source of infection. Continue amiodarone Continue to monitor in intensive care unit Continue  oxygen supplement  Suspected sepsis Check procalcitonin Aggressive antimicrobial therapy Panculture Check 2 wounds that are noted 1 has a history of osteomyelitis with antimicrobial therapy in July 2022  Rt elbow  Rt foot  Renal insufficiency Lab Results  Component Value Date   CREATININE 2.51 (H) 02/12/2021   CREATININE 2.57 (H) 02/11/2021   CREATININE 1.74 (H) 02/17/2021   CREATININE 1.49 (H) 03/03/2016   CREATININE 1.31 (H) 09/02/2015   CREATININE 1.51 (H) 08/15/2015   Avoid nephrotoxins Monitor creatinine  Complex past medical history and a 87 year old who has a known EF of 30% that is now down to 20% with ongoing complications of low flow state.  Palliative care consult DNR   Best Practice (right click and "Reselect all SmartList Selections" daily)   Diet/type: Regular consistency (see orders) DVT prophylaxis: not indicated GI prophylaxis: PPI Lines: N/A Foley:  N/A Code Status:  DNR Last date of multidisciplinary goals of care discussion  [per primary]  Labs   CBC: Recent Labs  Lab Mar 03, 2021 1500 03-03-21 1536 02/21/2021 0134 02/11/21 0117 02/12/21 0210  WBC 11.1*  --  10.3 12.7* 11.5*  HGB 10.7* 11.6* 10.1* 10.7* 10.0*  HCT 32.8* 34.0* 30.9* 32.2* 30.9*  MCV 108.6*  --  106.6* 105.6* 106.9*  PLT 144*  --  132* 110* 84*    Basic Metabolic Panel: Recent Labs  Lab 03-03-21 1500 Mar 03, 2021 1536 02/13/2021 0134 02/11/21 1602 02/12/21 0210  NA 137 138 136 136 134*  K 3.9 3.9 4.2 5.2* 4.6  CL 107 105 106 107 107  CO2 17*  --  19* 15* 11*  GLUCOSE 143* 140* 140* 112* 95  BUN 63* 71* 60* 90* 95*  CREATININE 1.88* 1.80* 1.74* 2.57* 2.51*  CALCIUM 8.4*  --  8.9 8.3* 8.1*  MG 1.3*  --  2.2 2.1  --    GFR: Estimated Creatinine Clearance: 15.4 mL/min (A) (by C-G formula based on SCr of 2.51 mg/dL (H)). Recent Labs  Lab March 03, 2021 1500 02/08/2021 0134 02/11/21 0117 02/12/21 0210 02/12/21 0243 02/12/21 0430 02/12/21 0710  PROCALCITON  --   --   --   --   --  0.74  --   WBC 11.1* 10.3 12.7* 11.5*  --   --   --   LATICACIDVEN  --   --   --   --  5.6* 6.0* 3.9*    Liver Function Tests: No results for input(s): AST, ALT, ALKPHOS, BILITOT, PROT, ALBUMIN in the last 168 hours. No results for input(s): LIPASE, AMYLASE in the last 168 hours. No results for input(s): AMMONIA in the last 168 hours.  ABG    Component Value Date/Time   PHART 7.525 (H) 02/12/2021 0442   PCO2ART 25.9 (L) 02/12/2021 0442   PO2ART 136 (H) 02/12/2021 0442   HCO3 21.4 02/12/2021 0442   TCO2 21 (L) 02/20/2021 1536   ACIDBASEDEF 1.2 02/12/2021 0442   O2SAT 99.4 02/12/2021 0442     Coagulation Profile: No results for input(s): INR, PROTIME in the last 168 hours.  Cardiac Enzymes: No results for input(s): CKTOTAL, CKMB, CKMBINDEX, TROPONINI in the last 168 hours.  HbA1C: No results found for: HGBA1C  CBG: Recent Labs  Lab 02/28/2021 1815  GLUCAP 97    Review of Systems:   10 point review of system taken, please see HPI for  positives and negatives.   Past Medical History:  He,  has a past medical history of AAA (abdominal aortic aneurysm), Bradycardia, Carotid artery occlusion, Chronic systolic heart failure (HCC), COPD (chronic obstructive pulmonary disease) (HCC), Coronary artery disease, Crohn's disease (HCC), Gout, Hyperlipidemia, Hypertension, Ischemic cardiomyopathy, Peripheral vascular disease (HCC), RBBB, Renal artery stenosis (HCC), Syncope, Tachycardia, and Ventricular tachycardia.   Surgical History:   Past Surgical History:  Procedure Laterality Date   ABDOMINAL AORTAGRAM N/A 06/11/2011   Procedure: ABDOMINAL AORTAGRAM;  Surgeon: [name], MD;  Location: [facility] CATH LAB;  Service: Cardiovascular;  Laterality: N/A;   ABDOMINAL AORTOGRAM W/LOWER EXTREMITY N/A 09/05/2020   Procedure: ABDOMINAL AORTOGRAM W/LOWER EXTREMITY;  Surgeon: [name], MD;  Location: [facility] INVASIVE CV LAB;  Service: Cardiovascular;  Laterality: N/A;   CARDIAC CATHETERIZATION  07/28/2012   Native 3v CAD, continued patency of the SVG-OM1-LAD and SVG-OM2-left PDA. LVEF 40% with multiple WMAs   CARDIAC CATHETERIZATION N/A 03/08/2015   Procedure: Left Heart Cath and Coronary Angiography;  Surgeon: [name], MD;  Location: [facility] INVASIVE CV LAB;  Service: Cardiovascular;  Laterality: N/A;   CAROTID ENDARTERECTOMY Left 2010   CATARACT EXTRACTION, BILATERAL     COLON RESECTION  2008   COLON SURGERY     CORONARY ANGIOPLASTY     CORONARY ARTERY BYPASS GRAFT  10/31/85 & 08/19/06   ENDARTERECTOMY  08/26/2011   Procedure: ENDARTERECTOMY ILIAC;  Surgeon: [name], MD;  Location: [facility] OR;  Service: Vascular;  Laterality: Right;  Right Femoral Artery Endarterectomy with vascu guard patch angioplasty & intraoperative arteriogram.   EYE SURGERY     FEMORAL ENDARTERECTOMY Left  06/2011   ileofemoral endarterectomy with bovine patch angioplasty   IR FLUORO GUIDE CV LINE RIGHT  10/22/2020   IR US GUIDE VASC ACCESS RIGHT  10/22/2020   LEFT  HEART CATHETERIZATION WITH CORONARY ANGIOGRAM N/A 07/28/2012   Procedure: LEFT HEART CATHETERIZATION WITH CORONARY ANGIOGRAM;  Surgeon: [name], MD;  Location: [facility] CATH LAB;  Service: Cardiovascular;  Laterality: N/A;   PACEMAKER IMPLANT N/A 03/10/2021   Procedure: PACEMAKER IMPLANT;  Surgeon: Marinus Maw, MD;  Location: [facility] INVASIVE CV LAB;  Service: Cardiovascular;  Laterality: N/A;   PERIPHERAL VASCULAR BALLOON ANGIOPLASTY Right 09/05/2020   Procedure: PERIPHERAL VASCULAR BALLOON ANGIOPLASTY;  Surgeon: [name],  MD;  Location: [facility] INVASIVE CV LAB;  Service: Cardiovascular;  Laterality: Right;  Posterior Tibial   PR VEIN BYPASS GRAFT,AORTO-FEM-POP  10/1985   TEMPORARY PACEMAKER N/A 02/18/2021   Procedure: TEMPORARY PACEMAKER;  Surgeon: Swazil[place], Peter M, MD;  Location: [facility] INVASIVE CV LAB;  Service: Cardiovascular;  Laterality: N/A;   TONSILLECTOMY     as a child     Social History:   reports that he has never smoked. He has never used smokeless tobacco. He reports that he does not drink alcohol and does not use drugs.   Family History:  His family history includes CAD in his brother; COPD in his brother and mother; Cancer in his mother; Deep vein thrombosis in his brother; Heart disease in his father. There is no history of Anesthesia problems.   Allergies Allergies  Allergen Reactions   Amoxicillin Diarrhea   Novocain [Procaine Hcl] Other (See Comments)    Cold sweats and very bad headache   Procaine Other (See Comments)    Sweating headache   Metoprolol Hives, Itching and Rash     Home Medications  Prior to Admission medications   Medication Sig Start Date End Date Taking? Authorizing Provider  acetaminophen (TYLENOL) 500 MG tablet Take 1,000 mg by mouth every 6 (six) hours as needed (pain).    Yes [provider]  aspirin EC 81 MG tablet Take 81 mg by mouth daily.   Yes [provider]  atorvastatin (LIPITOR) 10 MG tablet Take 10 mg by mouth  daily. 12/18/19  Yes [provider]  carboxymethylcellulose (REFRESH PLUS) 0.5 % SOLN Place 1 drop into both eyes 4 (four) times daily.   Yes [provider]  cholecalciferol (VITAMIN D3) 25 MCG (1000 UNIT) tablet Take 1,000 Units by mouth daily.   Yes [provider]  clopidogrel (PLAVIX) 75 MG tablet TAKE 1 TABLET EVERY DAY Patient taking differently: Take 75 mg by mouth daily. 11/01/20  Yes [name], MD  fexofenadine (ALLEGRA) 180 MG tablet Take 180 mg by mouth daily.   Yes [provider]  furosemide (LASIX) 40 MG tablet Take 20 mg by mouth every Monday, Wednesday, and Friday.   Yes [provider]  ipratropium (ATROVENT) 0.03 % nasal spray Place 1 spray into both nostrils daily.   Yes [provider]  isosorbide mononitrate (IMDUR) 120 MG 24 hr tablet Take 1 tablet (120 mg total) by mouth daily. 01/14/21  Yes [name], MD  isosorbide mononitrate (IMDUR) 60 MG 24 hr tablet Take 1 tablet (60 mg total) by mouth daily. 01/14/21  Yes [name], MD  losartan (COZAAR) 25 MG tablet TAKE 1 TABLET TWICE DAILY Patient taking differently: Take 25 mg by mouth in the morning and at bedtime. 07/08/20  Yes [name], MD  mexiletine (MEXITIL) 200 MG capsule TAKE 1 CAPSULE (200 MG TOTAL) BY MOUTH EVERY 12 HOURS. Patient taking differently: Take 200 mg by mouth every 12 (twelve) hours. 10/29/20  Yes [name], MD  Multiple Vitamin (MULTIVITAMIN WITH MINERALS) TABS tablet Take 2 tablets by mouth daily.   Yes [provider]  mupirocin ointment (BACTROBAN) 2 % Apply 1 application topically 2 (two) times daily. Apply to foot   Yes [provider]  nitroGLYCERIN (NITROSTAT) 0.4 MG SL tablet Place 1 tablet (0.4 mg total) under the tongue every 5 (five) minutes as needed for chest pain. 01/31/20  Yes Rosalio Macadamia, NP  OVER THE COUNTER MEDICATION Take 1 capsule by mouth daily. Medication: Nerve Health  Yes [provider]  pantoprazole (PROTONIX) 40 MG tablet TAKE 1 TABLET EVERY DAY Patient taking differently: Take 40 mg by mouth daily. 11/01/20  Yes [name], MD  Polyethyl Glycol-Propyl Glycol (SYSTANE) 0.4-0.3 % SOLN Place 1 drop into both eyes in the morning and at bedtime.   Yes [provider]  polyethylene glycol powder (GLYCOLAX/MIRALAX) powder Take 17 g by mouth at bedtime. Mix in 8 oz liquid and drink 10/15/15  Yes [provider]  ranolazine (RANEXA) 500 MG 12 hr tablet TAKE 2 TABLETS TWICE DAILY Patient taking differently: Take 500 mg by mouth 2 (two) times daily. 09/02/20  Yes [name], MD  spironolactone (ALDACTONE) 25 MG tablet Take 12.5 mg by mouth See admin instructions. Take one tablet by mouth on Monday, Wednesday and Fridays.   Yes [provider]  triamcinolone ointment (KENALOG) 0.1 % Apply 1 application topically 2 (two) times daily. Mix with Mupirocin and apply to head   Yes [provider]  vitamin B-12 (CYANOCOBALAMIN) 1000 MCG tablet Take 2,000 mcg by mouth daily.   Yes [provider]     Critical care time: 41 min    Brett Canales Holland Nickson ACNP Acute Care Nurse Practitioner [facility] Pulmonary/Critical Care Please consult Amion 02/12/2021, 8:28 AM

## 2021-02-12 NOTE — Progress Notes (Signed)
wide complex tachycardia with Vent pacing identified at 120-135 with BP in 70-80s systolic.  Patient reports being SOB, nauseated : Contacted cardiology on call provider . Requested provider please stop by and see patient . Orders obtained  to give amio 150mg  iv stat, 500cc bolus over 2 hours . EKG in am

## 2021-02-12 NOTE — Progress Notes (Signed)
Concerns for early sepsis. Lactic 5.6. hypotensive, ventricular paced tachycardia : Discussed with Cardiologist , will transfer to ICU. 1st dose cefepime, 2 amps bicarb , all given . Labs drawn and sent . Report given to ICU RN assuming care

## 2021-02-12 NOTE — Evaluation (Signed)
Physical Therapy Evaluation Patient Details Name: Karl Stevenson MRN: [id number] DOB: [birth year] Today's Date: 02/12/2021  History of Present Illness  87 y/o male w/ h/o chronic systolic heart failure 2/2 ICM, CAD, severe PAD, HTN, HLD, RBBB and h/o VT admitted w/ [facility] s/p PPM w/ development of profound cardiogenic shock w/ MSOF and lactic acidosis.  Clinical Impression  Patient presents with decreased mobility due to deficits listed in PT problem list below.  Today able to walk around bed with RW and min A.  Fingers and toes blue and tingling.  Patient eager to stay sitting, but returned to supine to allow for PICC placement.  PT will follow until officially comfort care.  Noted after session pt opting for home with Hospice.         Recommendations for follow up therapy are one component of a multi-disciplinary discharge planning process, led by the attending physician.  Recommendations may be updated based on patient status, additional functional criteria and insurance authorization.  Follow Up Recommendations Supervision/Assistance - 24 hour;No PT follow up (noted plans for home with Hospice and pt moving to comfort)    Equipment Recommendations  Other (comment) (likely to need equipment for Home Hospice)    Recommendations for Other Services       Precautions / Restrictions Precautions Precautions: Fall;ICD/Pacemaker      Mobility  Bed Mobility Overal bed mobility: Needs Assistance Bed Mobility: Supine to Sit;Sit to Supine       Sit to supine: Mod assist   General bed mobility comments: assist for legs into bed and for positioning    Transfers Overall transfer level: Needs assistance Equipment used: Rolling walker (2 wheeled) Transfers: Sit to/from Stand Sit to Stand: Min assist         General transfer comment: already on 3:1 with nursing when PT entered room, assisted for hygiene  Ambulation/Gait Ambulation/Gait assistance: Min assist;+2 safety/equipment Gait  Distance (Feet): 15 Feet Assistive device: Rolling walker (2 wheeled) Gait Pattern/deviations: Step-to pattern;Decreased stride length     General Gait Details: assist for balance, safety and +2 for lines; walked around bed  Stairs            Wheelchair Mobility    Modified Rankin (Stroke Patients Only)       Balance Overall balance assessment: Needs assistance   Sitting balance-Leahy Scale: Good     Standing balance support: Bilateral upper extremity supported Standing balance-Leahy Scale: Poor Standing balance comment: UE support for balance                             Pertinent Vitals/Pain Pain Assessment: Faces Faces Pain Scale: Hurts little more Pain Location: fingers tingling Pain Descriptors / Indicators: Discomfort Pain Intervention(s): Monitored during session;Limited activity within patient's tolerance    Home Living Family/patient expects to be discharged to:: Private residence Living Arrangements: Spouse/significant other Available Help at Discharge: Family;Available 24 hours/day Type of Home: House Home Access: Stairs to enter Entrance Stairs-Rails: None   Home Layout: One level Home Equipment: Cane - single point;Walker - 2 wheels;Bedside commode;Grab bars - tub/shower Additional Comments: has 3:1 but has not been using    Prior Function Level of Independence: Independent with assistive device(s)               Hand Dominance   Dominant Hand: Right    Extremity/Trunk Assessment   Upper Extremity Assessment Upper Extremity Assessment: Generalized weakness;RUE deficits/detail;LUE deficits/detail RUE Deficits / Details:  fingers blue, able to grip but weakly, tingling in fingers, but palm okay, elbow and shoulder 4/5 RUE Sensation: decreased light touch LUE Deficits / Details: fingers blue, able to grip but weakly, tingling in fingers, but palm okay, elbow and shoulder 4/5 LUE Sensation: decreased light touch    Lower  Extremity Assessment Lower Extremity Assessment: Generalized weakness;RLE deficits/detail;LLE deficits/detail RLE Deficits / Details: blue toes and distal feet and numbness up to thighs RLE Sensation: decreased light touch LLE Deficits / Details: blue toes and distal feet and numbness up to thighs LLE Sensation: decreased light touch    Cervical / Trunk Assessment Cervical / Trunk Assessment: Kyphotic  Communication   Communication: No difficulties  Cognition Arousal/Alertness: Awake/alert Behavior During Therapy: WFL for tasks assessed/performed Overall Cognitive Status: Within Functional Limits for tasks assessed                                        General Comments General comments (skin integrity, edema, etc.): sons in the room providing history    Exercises     Assessment/Plan    PT Assessment Patient needs continued PT services  PT Problem List Decreased strength;Decreased activity tolerance;Decreased balance;Decreased mobility;Cardiopulmonary status limiting activity       PT Treatment Interventions Functional mobility training;Therapeutic exercise;Therapeutic activities;DME instruction;Patient/family education;Gait training    PT Goals (Current goals can be found in the Care Plan section)  Acute Rehab PT Goals Patient Stated Goal: per chart to go home with Hospice PT Goal Formulation: With patient Time For Goal Achievement: 02/26/21 Potential to Achieve Goals: Fair    Frequency Min 3X/week   Barriers to discharge        Co-evaluation               AM-PAC PT "6 Clicks" Mobility  Outcome Measure Help needed turning from your back to your side while in a flat bed without using bedrails?: A Little Help needed moving from lying on your back to sitting on the side of a flat bed without using bedrails?: A Lot Help needed moving to and from a bed to a chair (including a wheelchair)?: A Little Help needed standing up from a chair using your  arms (e.g., wheelchair or bedside chair)?: A Little Help needed to walk in hospital room?: A Little Help needed climbing 3-5 steps with a railing? : A Lot 6 Click Score: 16    End of Session   Activity Tolerance: Patient limited by fatigue Patient left: in bed;with call bell/phone within reach;with family/visitor present;with nursing/sitter in room   PT Visit Diagnosis: Muscle weakness (generalized) (M62.81);Difficulty in walking, not elsewhere classified (R26.2)    Time: 3545-6256 PT Time Calculation (min) (ACUTE ONLY): 17 min   Charges:   PT Evaluation $PT Eval Moderate Complexity: 1 Mod          Cyndi Dareon Nunziato, PT Acute Rehabilitation Services [phone] [phone] 02/12/2021   Elray Mcgregor 02/12/2021, 4:25 PM

## 2021-02-12 NOTE — Progress Notes (Signed)
Peripherally Inserted Central Catheter Placement  The IV Nurse has discussed with the patient and/or persons authorized to consent for the patient, the purpose of this procedure and the potential benefits and risks involved with this procedure.  The benefits include less needle sticks, lab draws from the catheter, and the patient may be discharged home with the catheter. Risks include, but not limited to, infection, bleeding, blood clot (thrombus formation), and puncture of an artery; nerve damage and irregular heartbeat and possibility to perform a PICC exchange if needed/ordered by physician.  Alternatives to this procedure were also discussed.  Bard Power PICC patient education guide, fact sheet on infection prevention and patient information card has been provided to patient /or left at bedside.    PICC Placement Documentation  PICC Double Lumen 02/12/21 PICC Right Brachial 36 cm 0 cm (Active)  Indication for Insertion or Continuance of Line Administration of hyperosmolar/irritating solutions (i.e. TPN, Vancomycin, etc.) 02/12/21 1201  Exposed Catheter (cm) 0 cm 02/12/21 1201  Site Assessment Clean;Dry;Intact 02/12/21 1201  Lumen #1 Status Flushed;Saline locked;Blood return noted 02/12/21 1201  Lumen #2 Status Flushed;Saline locked;Blood return noted 02/12/21 1201  Dressing Type Transparent;Securing device 02/12/21 1201  Dressing Status Clean;Dry;Intact 02/12/21 1154  Antimicrobial disc in place? Yes 02/12/21 1201  Safety Lock Not Applicable 02/12/21 1201  Dressing Change Due 02/19/21 02/12/21 1201   PICC attempted in left arm. Placement aborted due to pacer on the left chest    Romie Jumper 02/12/2021, 12:05 PM

## 2021-02-12 NOTE — Progress Notes (Addendum)
Pharmacy Antibiotic Note  Karl Stevenson is a 87 y.o. male admitted on 03/09/2021 with syncope, now w/ elevated LA >> concern for sepsis.  Pharmacy has been consulted for vancomycin and cefepime dosing.  Noted AKI; baseline SCr ~1.5, now 2.5.  Plan: Rec'd vancomycin 1g 10/3 1700 and another 1g 10/4 0600 around temp pacemaker placement; given AKI will hold off on another dose for now as pt has 48h of coverage currently and monitor SCr +/- vanc level prior to redosing. Cefepime 2g IV Q24H.  Height: 5\' 3"  (160 cm) Weight: 59.2 kg (130 lb 8 oz) IBW/kg (Calculated) : 56.9  Temp (24hrs), Avg:98 F (36.7 C), Min:97.5 F (36.4 C), Max:98.6 F (37 C)  Recent Labs  Lab 02/11/2021 1500 03/07/2021 1536 February 20, 2021 0134 02/11/21 0117 02/11/21 1602 02/12/21 0210 02/12/21 0243  WBC 11.1*  --  10.3 12.7*  --  11.5*  --   CREATININE 1.88* 1.80* 1.74*  --  2.57* 2.51*  --   LATICACIDVEN  --   --   --   --   --   --  5.6*    Estimated Creatinine Clearance: 15.4 mL/min (A) (by C-G formula based on SCr of 2.51 mg/dL (H)).    Allergies  Allergen Reactions   Amoxicillin Diarrhea   Novocain [Procaine Hcl] Other (See Comments)    Cold sweats and very bad headache   Procaine Other (See Comments)    Sweating headache   Metoprolol Hives, Itching and Rash    Thank you for allowing pharmacy to be a part of this patient's care.  04/14/21, PharmD, BCPS 02/12/2021 4:08 AM

## 2021-02-12 NOTE — Progress Notes (Signed)
Was called overnight by the nurse that the patient HR is in 120s; and SBP in 70s Amio bolus and subsequently drip was started which improved the HR to 90s and SBP to 100s. This seemed like to be a similar episode of what happened in the afternoon As patient was not feeling well, lactate was sent which came out 5.6; and Bicarb 11.  Patient was transferred to the CCU. 2 amps of bicarb were given ABG was repeated. Broad spectrum Abx vancomycin and cefepime were started.  HR and SBP subsequently improved; however patient remains at high risk for deterioration. Code status discussed with family=Full code  I suspect he may be having low poor output as well. Will consider RHC/Swan in morning with addition of dopamine if SBP does not improve or lactate does not come down.   Pacemaker interrogation may be helpful again as well.

## 2021-02-12 NOTE — Progress Notes (Signed)
Worsening pain CVP up SvO2 in 20s Ext remain blue, dopplerable pulses; this remains just low flow state + severe PVD Salvage milrinone/levo/lasix; if no improvement recommend move to comfort care.  Will reach out to primary.  Myrla Halsted MD PCCM

## 2021-02-12 NOTE — Progress Notes (Signed)
ABG obtained and sent to lab, lab made aware. °

## 2021-02-12 NOTE — Consult Note (Addendum)
Advanced Heart Failure Team Consult Note   Primary Physician: Jarome Matin, MD PCP-Cardiologist:  [name], MD EP: Dr. Graciela Husbands   Reason for Consultation: Acute on Chronic Systolic Heart Failure w/ Profound Cardiogenic Shock   HPI:    Karl Stevenson is seen today for evaluation of acute on chronic systolic heart failure w/ profound cardiogenic shock at the request of Dr. Graciela Husbands, Electrophysiology.   87 y/o male w/ h/o chronic systolic heart failure 2/2 ICM, CAD, severe PAD, HTN, HLD, RBBB and h/o VT, admitted w/ syncope and found to be in CHB w/ AKI, SCr 1.80 on admit (baseline ~1.3). HS trop peaked to 13,000. Cath not perused. Echo showed EF 20-25% (previously 30-35%). RV ok. Underwent PPM implant on 10/3. Post procedure developed profound cardiogenic shock w/ worsening AKI and lactic acidosis. Scr bumped to 2.6. CO2 11. Lactate 6.0. Also w/ runs of NSVT, stated on amio gtt. PICC line placed and found to have severely low Co-ox 28%. Started on milrinone 0.25. Repeat Co-ox 44%. CVP 18 but no dyspnea at rest, requiring supp O2 via Ruch. Distal digits cyanotic. Bedside echo performed by Dr. Graciela Husbands showed no evidence of pericardial effusion/ tamponade.   Despite condition, his mentation is fairly ok and he is conversant and able to voice that he would like to transition to comfort care w/ home hospice. Family present at beside and also supportive of decision. He is now DNR. Palliative care team has been consulted.    Echo 2021-03-06  1. Global hypokinesis with akinesis of the inferolateral wall and apex;  overall severe LV dysfunction.   2. Left ventricular ejection fraction, by estimation, is 20 to 25%. The  left ventricle has severely decreased function. The left ventricle  demonstrates regional wall motion abnormalities (see scoring  diagram/findings for description). Left ventricular  diastolic parameters are consistent with Grade III diastolic dysfunction  (restrictive).   3. Right  ventricular systolic function is normal. The right ventricular  size is normal. There is moderately elevated pulmonary artery systolic  pressure.   4. The mitral valve is normal in structure. Mild mitral valve  regurgitation. No evidence of mitral stenosis.   5. The aortic valve is tricuspid. Aortic valve regurgitation is not  visualized. No aortic stenosis is present.   6. The inferior vena cava is dilated in size with <50% respiratory  variability, suggesting right atrial pressure of 15 mmHg.   Review of Systems: [y] = yes, [ ]  = no   General: Weight gain [ ] ; Weight loss [ ] ; Anorexia [ ] ; Fatigue [Y ]; Fever [ ] ; Chills [ ] ; Weakness [Y ]  Cardiac: Chest pain/pressure [ ] ; Resting SOB [ ] ; Exertional SOB [ Y]; Orthopnea [ ] ; Pedal Edema [ ] ; Palpitations [ ] ; Syncope [ Y]; Presyncope [ ] ; Paroxysmal nocturnal dyspnea[ ]   Pulmonary: Cough [ ] ; Wheezing[ ] ; Hemoptysis[ ] ; Sputum [ ] ; Snoring [ ]   GI: Vomiting[ ] ; Dysphagia[ ] ; Melena[ ] ; Hematochezia [ ] ; Heartburn[ ] ; Abdominal pain [ ] ; Constipation [ ] ; Diarrhea [ ] ; BRBPR [ ]   GU: Hematuria[ ] ; Dysuria [ ] ; Nocturia[ ]   Vascular: Pain in legs with walking [ ] ; Pain in feet with lying flat [ ] ; Non-healing sores [ ] ; Stroke [ ] ; TIA [ ] ; Slurred speech [ ] ;  Neuro: Headaches[ ] ; Vertigo[ ] ; Seizures[ ] ; Paresthesias[ ] ;Blurred vision [ ] ; Diplopia [ ] ; Vision changes [ ]   Ortho/Skin: Arthritis [ ] ; Joint pain [ ] ; Muscle pain [ ] ;  Joint swelling [ ] ; Back Pain [ ] ; Rash [ ]   Psych: Depression[ ] ; Anxiety[ ]   Heme: Bleeding problems [ ] ; Clotting disorders [ ] ; Anemia [ ]   Endocrine: Diabetes [ ] ; Thyroid dysfunction[ ]   Home Medications Prior to Admission medications   Medication Sig Start Date End Date Taking? Authorizing Provider  acetaminophen (TYLENOL) 500 MG tablet Take 1,000 mg by mouth every 6 (six) hours as needed (pain).    Yes [provider]  aspirin EC 81 MG tablet Take 81 mg by mouth daily.   Yes [provider]  atorvastatin (LIPITOR) 10 MG tablet Take 10 mg by mouth daily. 12/18/19  Yes [provider]  carboxymethylcellulose (REFRESH PLUS) 0.5 % SOLN Place 1 drop into both eyes 4 (four) times daily.   Yes [provider]  cholecalciferol (VITAMIN D3) 25 MCG (1000 UNIT) tablet Take 1,000 Units by mouth daily.   Yes [provider]  clopidogrel (PLAVIX) 75 MG tablet TAKE 1 TABLET EVERY DAY Patient taking differently: Take 75 mg by mouth daily. 11/01/20  Yes [name], MD  fexofenadine (ALLEGRA) 180 MG tablet Take 180 mg by mouth daily.   Yes [provider]  furosemide (LASIX) 40 MG tablet Take 20 mg by mouth every Monday, Wednesday, and Friday.   Yes [provider]  ipratropium (ATROVENT) 0.03 % nasal spray Place 1 spray into both nostrils daily.   Yes [provider]  isosorbide mononitrate (IMDUR) 120 MG 24 hr tablet Take 1 tablet (120 mg total) by mouth daily. 01/14/21  Yes [name], MD  isosorbide mononitrate (IMDUR) 60 MG 24 hr tablet Take 1 tablet (60 mg total) by mouth daily. 01/14/21  Yes [name], MD  losartan (COZAAR) 25 MG tablet TAKE 1 TABLET TWICE DAILY Patient taking differently: Take 25 mg by mouth in the morning and at bedtime. 07/08/20  Yes [name], MD  mexiletine (MEXITIL) 200 MG capsule TAKE 1 CAPSULE (200 MG TOTAL) BY MOUTH EVERY 12 HOURS. Patient taking differently: Take 200 mg by mouth every 12 (twelve) hours. 10/29/20  Yes [name], MD  Multiple Vitamin (MULTIVITAMIN WITH MINERALS) TABS tablet Take 2 tablets by mouth daily.   Yes [provider]  mupirocin ointment (BACTROBAN) 2 % Apply 1 application topically 2 (two) times daily. Apply to foot   Yes [provider]  nitroGLYCERIN (NITROSTAT) 0.4 MG SL tablet Place 1 tablet (0.4 mg total) under the tongue every 5 (five) minutes as needed for chest pain. 01/31/20  Yes Rosalio Macadamia, NP  OVER THE COUNTER MEDICATION Take  1 capsule by mouth daily. Medication: Nerve Health   Yes [provider]  pantoprazole (PROTONIX) 40 MG tablet TAKE 1 TABLET EVERY DAY Patient taking differently: Take 40 mg by mouth daily. 11/01/20  Yes [name], MD  Polyethyl Glycol-Propyl Glycol (SYSTANE) 0.4-0.3 % SOLN Place 1 drop into both eyes in the morning and at bedtime.   Yes [provider]  polyethylene glycol powder (GLYCOLAX/MIRALAX) powder Take 17 g by mouth at bedtime. Mix in 8 oz liquid and drink 10/15/15  Yes [provider]  ranolazine (RANEXA) 500 MG 12 hr tablet TAKE 2 TABLETS TWICE DAILY Patient taking differently: Take 500 mg by mouth 2 (two) times daily. 09/02/20  Yes [name], MD  spironolactone (ALDACTONE) 25 MG tablet Take 12.5 mg by mouth See admin instructions. Take one tablet by mouth on Monday, Wednesday and Fridays.   Yes [provider]  triamcinolone ointment (  KENALOG) 0.1 % Apply 1 application topically 2 (two) times daily. Mix with Mupirocin and apply to head   Yes [provider]  vitamin B-12 (CYANOCOBALAMIN) 1000 MCG tablet Take 2,000 mcg by mouth daily.   Yes [provider]    Past Medical History: Past Medical History:  Diagnosis Date   AAA (abdominal aortic aneurysm)    a. Ectatic abdominal aorta with fusiform dilitation 3.4x3.4 and moderate thrombus burden 12/2010   Bradycardia    a. previous intolerance to beta blockers - low dose toprol started 10/2011 for tachycardia but had rash and was discontinued;  b. tolerating low dose propranolol.   Carotid artery occlusion    a. s/p L CEA in 2010;  b. 06/2014 Carotid U/S: stable LICA CEA site w/o significant stenosis bilat.   Chronic systolic heart failure (HCC)    a. 02/2015 Echo:  35-40% (echo 11/2010); c. 02/2015 Echo: EF 20-25%, diff HK, mid-apicalanteroseptal and apical AK, Gr 1 DD, mild MR.   COPD (chronic obstructive pulmonary disease) (HCC)    Coronary artery disease    a. 1969 s/p MI;   b. S/P CABG 1987;  c. S/P redo 2008;  d. 07/2012 Cath: stable->Med Rx, EF 40%; e. 02/2014 MV: anterior and posterolateral/apical infarct with mild peri-infarct ischemia->Med Rx; f. 02/2015 Cath: LM 100d, LAD 100ost, LCX 100ost, OM2 100, VG->OM3->OM4 nl, VG-> LAD nl (Y from VG->OM), VG->OM2 100p.   Crohn's disease (HCC)    a. s/p colon resection   Gout    Hyperlipidemia    Hypertension    Ischemic cardiomyopathy    a. EF 34% (Lexiscan 06/2011); b. 35-40% (echo 11/2010); c. 02/2015 Echo: EF 20-25%, diff HK, mid-apicalanteroseptal and apical AK, Gr 1 DD, mild MR.   Peripheral vascular disease (HCC)    a. Carotid dz s/p L CEA, RAS, AAA, LE dz   RBBB    Renal artery stenosis (HCC)    a. Dopplers 12/2010 - 1-59% R ostial renal artery stenosis, 2 L renal arteries both widely patent   Syncope    Tachycardia    a. 10/2011 - atrial tach vs avnrt - BB started.   Ventricular tachycardia    a. 02/2015 ->amio 200 daily added-->later switched to mexilitene.    Past Surgical History: Past Surgical History:  Procedure Laterality Date   ABDOMINAL AORTAGRAM N/A 06/11/2011   Procedure: ABDOMINAL AORTAGRAM;  Surgeon: [name], MD;  Location: [facility] CATH LAB;  Service: Cardiovascular;  Laterality: N/A;   ABDOMINAL AORTOGRAM W/LOWER EXTREMITY N/A 09/05/2020   Procedure: ABDOMINAL AORTOGRAM W/LOWER EXTREMITY;  Surgeon: [name], MD;  Location: [facility] INVASIVE CV LAB;  Service: Cardiovascular;  Laterality: N/A;   CARDIAC CATHETERIZATION  07/28/2012   Native 3v CAD, continued patency of the SVG-OM1-LAD and SVG-OM2-left PDA. LVEF 40% with multiple WMAs   CARDIAC CATHETERIZATION N/A 03/08/2015   Procedure: Left Heart Cath and Coronary Angiography;  Surgeon: [name], MD;  Location: [facility] INVASIVE CV LAB;  Service: Cardiovascular;  Laterality: N/A;   CAROTID ENDARTERECTOMY Left 2010   CATARACT EXTRACTION, BILATERAL     COLON RESECTION  2008   COLON SURGERY     CORONARY ANGIOPLASTY     CORONARY ARTERY  BYPASS GRAFT  10/31/85 & 08/19/06   ENDARTERECTOMY  08/26/2011   Procedure: ENDARTERECTOMY ILIAC;  Surgeon: [name], MD;  Location: [facility] OR;  Service: Vascular;  Laterality: Right;  Right Femoral Artery Endarterectomy with vascu guard patch angioplasty & intraoperative arteriogram.   EYE SURGERY  FEMORAL ENDARTERECTOMY Left  06/2011   ileofemoral endarterectomy with bovine patch angioplasty   IR FLUORO GUIDE CV LINE RIGHT  10/22/2020   IR US GUIDE VASC ACCESS RIGHT  10/22/2020   LEFT HEART CATHETERIZATION WITH CORONARY ANGIOGRAM N/A 07/28/2012   Procedure: LEFT HEART CATHETERIZATION WITH CORONARY ANGIOGRAM;  Surgeon: [name], MD;  Location: [facility] CATH LAB;  Service: Cardiovascular;  Laterality: N/A;   PACEMAKER IMPLANT N/A 2021/02/19   Procedure: PACEMAKER IMPLANT;  Surgeon: Marinus Maw, MD;  Location: [facility] INVASIVE CV LAB;  Service: Cardiovascular;  Laterality: N/A;   PERIPHERAL VASCULAR BALLOON ANGIOPLASTY Right 09/05/2020   Procedure: PERIPHERAL VASCULAR BALLOON ANGIOPLASTY;  Surgeon: [name], MD;  Location: [facility] INVASIVE CV LAB;  Service: Cardiovascular;  Laterality: Right;  Posterior Tibial   PR VEIN BYPASS GRAFT,AORTO-FEM-POP  10/1985   TEMPORARY PACEMAKER N/A 03/03/2021   Procedure: TEMPORARY PACEMAKER;  Surgeon: [place]az[place], Peter M, MD;  Location: [facility] INVASIVE CV LAB;  Service: Cardiovascular;  Laterality: N/A;   TONSILLECTOMY     as a child    Family History: Family History  Problem Relation Age of Onset   Heart disease Father    Cancer Mother    COPD Mother    Deep vein thrombosis Brother    COPD Brother    CAD Brother    Anesthesia problems Neg Hx     Social History: Social History   Socioeconomic History   Marital status: Married    Spouse name: Not on file   Number of children: Not on file   Years of education: Not on file   Highest education level: Not on file  Occupational History   Not on file  Tobacco Use   Smoking status: Never   Smokeless  tobacco: Never  Vaping Use   Vaping Use: Never used  Substance and Sexual Activity   Alcohol use: No   Drug use: No   Sexual activity: Not on file  Other Topics Concern   Not on file  Social History Narrative   Not on file   Social Determinants of Health   Financial Resource Strain: Not on file  Food Insecurity: Not on file  Transportation Needs: Not on file  Physical Activity: Not on file  Stress: Not on file  Social Connections: Not on file    Allergies:  Allergies  Allergen Reactions   Amoxicillin Diarrhea   Novocain [Procaine Hcl] Other (See Comments)    Cold sweats and very bad headache   Procaine Other (See Comments)    Sweating headache   Metoprolol Hives, Itching and Rash    Objective:    Vital Signs:   Temp:  [98 F (36.7 C)-98.6 F (37 C)] 98 F (36.7 C) (10/04 2240) Pulse Rate:  [25-120] 87 (10/05 1400) Resp:  [18-32] 21 (10/05 1400) BP: (71-117)/(57-86) 104/67 (10/05 1400) SpO2:  [68 %-100 %] 100 % (10/05 1400) Weight:  [59.2 kg] 59.2 kg (10/04 2240) Last BM Date: 02/11/21  Weight change: Filed Weights   02/12/2021 1519 02/11/21 0500 02/11/21 2240  Weight: 58.9 kg 58.5 kg 59.2 kg    Intake/Output:   Intake/Output Summary (Last 24 hours) at 02/12/2021 1507 Last data filed at 02/12/2021 1447 Gross per 24 hour  Intake 939.78 ml  Output 600 ml  Net 339.78 ml      Physical Exam    CVP 18  General:  elderly WM, fatigued appearing. Coughing, no increased WOB  HEENT: normal Neck: supple. JVP elevated to ear . Carotids 2+  bilat; no bruits. No lymphadenopathy or thyromegaly appreciated. Cor: PMI nondisplaced. Regular rate & rhythm. No rubs, gallops or murmurs. Lungs: decreased BS at the bases bilaterally  Abdomen: soft, nontender, nondistended. No hepatosplenomegaly. No bruits or masses. Good bowel sounds. Extremities: no clubbing, rash, no edema. cold distal extremities, distal digits cyanotic + RUE PICC  Neuro: alert & orientedx3, cranial  nerves grossly intact. moves all 4 extremities w/o difficulty. Affect pleasant   Telemetry   V-paced 98 bpm   EKG    V-paced 115 bpm   Labs   Basic Metabolic Panel: Recent Labs  Lab 02/26/2021 1500 02/18/2021 1536 03/02/2021 0134 02/11/21 1602 02/12/21 0210 02/12/21 0821  NA 137 138 136 136 134* 135  K 3.9 3.9 4.2 5.2* 4.6 4.4  CL 107 105 106 107 107 103  CO2 17*  --  19* 15* 11* 15*  GLUCOSE 143* 140* 140* 112* 95 140*  BUN 63* 71* 60* 90* 95* 94*  CREATININE 1.88* 1.80* 1.74* 2.57* 2.51* 2.48*  CALCIUM 8.4*  --  8.9 8.3* 8.1* 7.9*  MG 1.3*  --  2.2 2.1  --   --     Liver Function Tests: Recent Labs  Lab 02/12/21 0821  AST 2,033*  ALT 1,837*  ALKPHOS 101  BILITOT 1.0  PROT 5.4*  ALBUMIN 2.8*   No results for input(s): LIPASE, AMYLASE in the last 168 hours. No results for input(s): AMMONIA in the last 168 hours.  CBC: Recent Labs  Lab 03/05/2021 1500 03/08/2021 1536 03/07/2021 0134 02/11/21 0117 02/12/21 0210  WBC 11.1*  --  10.3 12.7* 11.5*  HGB 10.7* 11.6* 10.1* 10.7* 10.0*  HCT 32.8* 34.0* 30.9* 32.2* 30.9*  MCV 108.6*  --  106.6* 105.6* 106.9*  PLT 144*  --  132* 110* 84*    Cardiac Enzymes: No results for input(s): CKTOTAL, CKMB, CKMBINDEX, TROPONINI in the last 168 hours.  BNP: BNP (last 3 results) No results for input(s): BNP in the last 8760 hours.  ProBNP (last 3 results) No results for input(s): PROBNP in the last 8760 hours.   CBG: Recent Labs  Lab 02/25/2021 1815  GLUCAP 97    Coagulation Studies: No results for input(s): LABPROT, INR in the last 72 hours.   Imaging   DG CHEST PORT 1 VIEW  Result Date: 02/12/2021 CLINICAL DATA:  Tachycardia EXAM: PORTABLE CHEST 1 VIEW COMPARISON:  02/11/2021 FINDINGS: Cardiac shadow is stable. Pacing device is again seen and stable. Postsurgical changes are noted. Small pleural effusions are noted bilaterally. The degree of vascular congestion has improved somewhat in the interval from the prior  exam. Skin folds are noted over the right chest. Mild fluid in the minor fissure is noted as well. No bony abnormality is seen. IMPRESSION: Bilateral effusions right greater than left. Improvement in the degree of vascular congestion when compared with the prior study. Electronically Signed   By: Alcide Clever M.D.   On: 02/12/2021 02:57   [place] EKG SITE RITE  Result Date: 02/12/2021 If Site Rite image not attached, placement could not be confirmed due to current cardiac rhythm.    Medications:     Current Medications:  aspirin EC  81 mg Oral Daily   atorvastatin  10 mg Oral Daily   Chlorhexidine Gluconate Cloth  6 each Topical Daily   cholecalciferol  1,000 Units Oral Daily   loratadine  10 mg Oral Daily   mouth rinse  15 mL Mouth Rinse BID   midodrine  10 mg Oral  TID WC   mupirocin ointment  1 application Topical BID   pantoprazole  40 mg Oral Daily   polyethylene glycol  17 g Oral QHS   polyvinyl alcohol  1 drop Both Eyes BID   sodium bicarbonate  650 mg Oral QID   sodium chloride flush  10-40 mL Intracatheter Q12H   sodium chloride flush  10-40 mL Intracatheter Q12H   sodium chloride flush  3 mL Intravenous Q12H   triamcinolone ointment  1 application Topical BID   vitamin B-12  2,000 mcg Oral Daily    Infusions:  sodium chloride 500 mL (02/23/2021 1555)   sodium chloride 250 mL (02/12/21 1317)   amiodarone 30 mg/hr (02/12/21 0836)   [START ON 02/13/2021] ceFEPime (MAXIPIME) IV     milrinone 0.25 mcg/kg/min (02/12/21 1346)   norepinephrine (LEVOPHED) Adult infusion       Assessment/Plan   87 y/o male w/ h/o chronic systolic heart failure 2/2 ICM, CAD, severe PAD, HTN, HLD, RBBB and h/o VT admitted w/ CHB s/p PPM w/ development of profound cardiogenic shock w/ MSOF and lactic acidosis. SCr now up to 2.6, CO2 11, lactic acid 6.0, Co-ox markedly low at 25%, now up to 44% on 0.25 of milrinone. Echo shows severely reduced LVEF 20-25%.  Exam also c/w low perfusion w/ cyanotic distal  digits. He is not a candidate for advanced therapies including mechanical support due to advanced age and other comorbidities including severe PAD. Long discussion regarding goals of care. He has made decision to transition to comfort care and wishes to go home w/ hospice. He is now DNR. Can continue milrinone until home hospice arrangements are made. Turn off gtt at d/c. CVP 18. No dyspnea but coughing. Can use high dose IV Lasix for decongestion. PRN morphine. Palliative care consult has been placed.     Length of Stay: [address], PA-C  02/12/2021, 3:07 PM  Advanced Heart Failure Team Pager [phone] (M-F; 7a - 5p)  Please contact [facility] for night-coverage after hours (4p -7a ) and weekends on [email or web address]  Agree with above.   87 y/o male with severe PAD, CAD s/p CABG, chronic systolic HF with previous EF 30-35%  At baseline not very functional. Lives at home but mainly sits in his chair.   Admitted with CHB and shock. PPM placed.  Echo EF 20-25%. This am developed worsening shock with severe ischemic pain in hands. Co-ox 28%. Now on milrinone 0.25. Co-ox  28% -> 44% -> 56%. Feels better.   General:  Elderly. No resp difficulty HEENT: normal Neck: supple. JVP 9-10 with prominent CV waves Carotids 2+ bilat; no bruits. No lymphadenopathy or thryomegaly appreciated. Cor: PMI nondisplaced. Regular rate & rhythm. No rubs, gallops or murmurs. Lungs: + crackles  Abdomen: soft, nontender, nondistended. No hepatosplenomegaly. No bruits or masses. Good bowel sounds. Extremities: no edema. Diffuse cyanosis Neuro: alert & orientedx3, cranial nerves grossly intact. moves all 4 extremities w/o difficulty. Affect pleasant  He is terminally ill with no options for durable survival. That said he has improved with milrinone support.   I discussed with him and his family that milrinone is only a short-term "bandaid" and will not provided sustained benefit. They were wanting to take him  home with hospice but I worry that once milrinone stopped his ischemic pain will be to difficult to control.   We have decided to have more family come in and see him and once family has seen him he will decide to  turn milrinone off. Prior to turning milrinone off will give morphine 2 mg bolus followed by 2mg /hr drip that can be titrated as needed. If he "stabilizes" on this regimen can consider transfer to [facility] or otherwise suspect he may pass in ICU.   D/w Dr. [employer] at bedside.   CRITICAL CARE Performed by: Excell Seltzer  Total critical care time: 55 minutes  Critical care time was exclusive of separately billable procedures and treating other patients.  Critical care was necessary to treat or prevent imminent or life-threatening deterioration.  Critical care was time spent personally by me (independent of midlevel providers or residents) on the following activities: development of treatment plan with patient and/or surrogate as well as nursing, discussions with consultants, evaluation of patient's response to treatment, examination of patient, obtaining history from patient or surrogate, ordering and performing treatments and interventions, ordering and review of laboratory studies, ordering and review of radiographic studies, pulse oximetry and re-evaluation of patient's condition.  Arvilla Meres, MD  5:12 PM

## 2021-02-13 ENCOUNTER — Encounter (HOSPITAL_BASED_OUTPATIENT_CLINIC_OR_DEPARTMENT_OTHER): Payer: Medicare PPO | Admitting: Internal Medicine

## 2021-02-13 ENCOUNTER — Other Ambulatory Visit: Payer: Self-pay

## 2021-02-13 ENCOUNTER — Encounter (HOSPITAL_COMMUNITY): Payer: Self-pay | Admitting: Cardiology

## 2021-02-13 DIAGNOSIS — Z66 Do not resuscitate: Secondary | ICD-10-CM

## 2021-02-13 DIAGNOSIS — R52 Pain, unspecified: Secondary | ICD-10-CM

## 2021-02-13 DIAGNOSIS — I5022 Chronic systolic (congestive) heart failure: Secondary | ICD-10-CM | POA: Diagnosis not present

## 2021-02-13 DIAGNOSIS — Z515 Encounter for palliative care: Secondary | ICD-10-CM | POA: Diagnosis not present

## 2021-02-13 DIAGNOSIS — Z950 Presence of cardiac pacemaker: Secondary | ICD-10-CM

## 2021-02-13 DIAGNOSIS — R0609 Other forms of dyspnea: Secondary | ICD-10-CM

## 2021-02-13 DIAGNOSIS — R570 Cardiogenic shock: Secondary | ICD-10-CM | POA: Diagnosis not present

## 2021-02-13 LAB — CBC
HCT: 25.8 % — ABNORMAL LOW (ref 39.0–52.0)
Hemoglobin: 8.8 g/dL — ABNORMAL LOW (ref 13.0–17.0)
MCH: 35.3 pg — ABNORMAL HIGH (ref 26.0–34.0)
MCHC: 34.1 g/dL (ref 30.0–36.0)
MCV: 103.6 fL — ABNORMAL HIGH (ref 80.0–100.0)
Platelets: 68 10*3/uL — ABNORMAL LOW (ref 150–400)
RBC: 2.49 MIL/uL — ABNORMAL LOW (ref 4.22–5.81)
RDW: 13.4 % (ref 11.5–15.5)
WBC: 8.2 10*3/uL (ref 4.0–10.5)
nRBC: 2 % — ABNORMAL HIGH (ref 0.0–0.2)

## 2021-02-13 LAB — BASIC METABOLIC PANEL
Anion gap: 13 (ref 5–15)
BUN: 100 mg/dL — ABNORMAL HIGH (ref 8–23)
CO2: 19 mmol/L — ABNORMAL LOW (ref 22–32)
Calcium: 7.7 mg/dL — ABNORMAL LOW (ref 8.9–10.3)
Chloride: 100 mmol/L (ref 98–111)
Creatinine, Ser: 2.6 mg/dL — ABNORMAL HIGH (ref 0.61–1.24)
GFR, Estimated: 23 mL/min — ABNORMAL LOW (ref >60)
Glucose, Bld: 121 mg/dL — ABNORMAL HIGH (ref 70–99)
Potassium: 4 mmol/L (ref 3.5–5.1)
Sodium: 132 mmol/L — ABNORMAL LOW (ref 135–145)

## 2021-02-13 LAB — COOXEMETRY PANEL
Carboxyhemoglobin: 0.8 % (ref 0.5–1.5)
Methemoglobin: 1 % (ref 0.0–1.5)
O2 Saturation: 63 %
Total hemoglobin: 9 g/dL — ABNORMAL LOW (ref 12.0–16.0)

## 2021-02-13 LAB — PROCALCITONIN: Procalcitonin: 1.29 ng/mL

## 2021-02-13 MED ORDER — LORAZEPAM 2 MG/ML IJ SOLN
1.0000 mg | INTRAMUSCULAR | Status: DC | PRN
  Administered 2021-02-13: 1 mg via INTRAVENOUS
  Filled 2021-02-13 (×2): qty 1

## 2021-02-13 MED ORDER — LORAZEPAM 2 MG/ML IJ SOLN
2.0000 mg | Freq: Once | INTRAMUSCULAR | Status: AC
  Administered 2021-02-13: 2 mg via INTRAVENOUS

## 2021-02-13 MED ORDER — HYDROMORPHONE HCL 1 MG/ML IJ SOLN
1.0000 mg | Freq: Once | INTRAMUSCULAR | Status: AC
  Administered 2021-02-13: 1 mg via INTRAVENOUS
  Filled 2021-02-13: qty 1

## 2021-02-13 MED ORDER — MORPHINE BOLUS VIA INFUSION
4.0000 mg | Freq: Once | INTRAVENOUS | Status: AC
  Administered 2021-02-13: 4 mg via INTRAVENOUS
  Filled 2021-02-13: qty 4

## 2021-02-13 NOTE — Progress Notes (Addendum)
In to assess patient.   Family all present.   Pt with agonal respirations. Discussed with Dr. Neale Burly. Will give morphine bolus 4 mg and repeat in 15 minutes if not improve. If not improved further at that point will give his ativan as well.   Family verbalizes understanding that he will likely pass within the next several hours.   Casimiro Needle "Mardelle Matte" Golden, PA-C  02/13/2021 1:37 PM    Continues with agonal respirations and occasional grunt/gasp.   Discussed with Dr. Neale Burly and will give 1 of dilaudid to see if a different opioid improves his respirations.   Casimiro Needle [address]" [place], PA-C  02/13/2021 3:15 PM

## 2021-02-13 NOTE — Plan of Care (Signed)
  Problem: Pain Managment: Goal: General experience of comfort will improve Outcome: Progressing   Problem: Education: Goal: Knowledge of cardiac device and self-care will improve Outcome: Progressing   Problem: Cardiac: Goal: Ability to achieve and maintain adequate cardiopulmonary perfusion will improve Outcome: Not Progressing

## 2021-02-13 NOTE — Progress Notes (Signed)
Advanced Heart Failure Rounding Note   Subjective:     Milrinone stopped this am. Morphine gtt started. Ischemic pain in hands has returned since stopping milrinone.   Feels weak. Lethargic   Objective:   Weight Range:  Vital Signs:   Pulse Rate:  [82-102] 95 (10/06 0900) Resp:  [14-29] 14 (10/06 0900) BP: (101-117)/(58-82) 108/66 (10/06 0900) SpO2:  [88 %-100 %] 100 % (10/06 0900) Weight:  [60.9 kg] 60.9 kg (10/06 0500) Last BM Date: 02/11/21  Weight change: Filed Weights   02/11/21 0500 02/11/21 2240 02/13/21 0500  Weight: 58.5 kg 59.2 kg 60.9 kg    Intake/Output:   Intake/Output Summary (Last 24 hours) at 02/13/2021 0934 Last data filed at 02/13/2021 0800 Gross per 24 hour  Intake 834.51 ml  Output 235 ml  Net 599.51 ml     Physical Exam: General:  Elderly lethargic HEENT: normal Neck: supple. JVP  to jaw Carotids 2+ bilat; no bruits. No lymphadenopathy or thryomegaly appreciated. Cor: PMI nondisplaced. Regular rate & rhythm. Lungs: coarse Abdomen: soft, nontender, nondistended. No hepatosplenomegaly. No bruits or masses. Good bowel sounds. Extremities: cool dusky  Neuro: lethargic but arousable  Telemetry: discconected  Labs: Basic Metabolic Panel: Recent Labs  Lab 02-22-2021 1500 02/22/21 1536 02/28/2021 0134 02/11/21 1602 02/12/21 0210 02/12/21 0821 02/13/21 0410  NA 137   < > 136 136 134* 135 132*  K 3.9   < > 4.2 5.2* 4.6 4.4 4.0  CL 107   < > 106 107 107 103 100  CO2 17*  --  19* 15* 11* 15* 19*  GLUCOSE 143*   < > 140* 112* 95 140* 121*  BUN 63*   < > 60* 90* 95* 94* 100*  CREATININE 1.88*   < > 1.74* 2.57* 2.51* 2.48* 2.60*  CALCIUM 8.4*  --  8.9 8.3* 8.1* 7.9* 7.7*  MG 1.3*  --  2.2 2.1  --   --   --    < > = values in this interval not displayed.    Liver Function Tests: Recent Labs  Lab 02/12/21 0821  AST 2,033*  ALT 1,837*  ALKPHOS 101  BILITOT 1.0  PROT 5.4*  ALBUMIN 2.8*   No results for input(s): LIPASE, AMYLASE in  the last 168 hours. No results for input(s): AMMONIA in the last 168 hours.  CBC: Recent Labs  Lab February 22, 2021 1500 02/22/2021 1536 03/01/2021 0134 02/11/21 0117 02/12/21 0210 02/13/21 0410  WBC 11.1*  --  10.3 12.7* 11.5* 8.2  HGB 10.7* 11.6* 10.1* 10.7* 10.0* 8.8*  HCT 32.8* 34.0* 30.9* 32.2* 30.9* 25.8*  MCV 108.6*  --  106.6* 105.6* 106.9* 103.6*  PLT 144*  --  132* 110* 84* 68*    Cardiac Enzymes: No results for input(s): CKTOTAL, CKMB, CKMBINDEX, TROPONINI in the last 168 hours.  BNP: BNP (last 3 results) No results for input(s): BNP in the last 8760 hours.  ProBNP (last 3 results) No results for input(s): PROBNP in the last 8760 hours.    Other results:  Imaging: DG CHEST PORT 1 VIEW  Result Date: 02/12/2021 CLINICAL DATA:  Tachycardia EXAM: PORTABLE CHEST 1 VIEW COMPARISON:  02/11/2021 FINDINGS: Cardiac shadow is stable. Pacing device is again seen and stable. Postsurgical changes are noted. Small pleural effusions are noted bilaterally. The degree of vascular congestion has improved somewhat in the interval from the prior exam. Skin folds are noted over the right chest. Mild fluid in the minor fissure is noted as well. No  bony abnormality is seen. IMPRESSION: Bilateral effusions right greater than left. Improvement in the degree of vascular congestion when compared with the prior study. Electronically Signed   By: Alcide Clever M.D.   On: 02/12/2021 02:57   [place] EKG SITE RITE  Result Date: 02/12/2021 If Site Rite image not attached, placement could not be confirmed due to current cardiac rhythm.    Medications:     Scheduled Medications:  Chlorhexidine Gluconate Cloth  6 each Topical Daily   mouth rinse  15 mL Mouth Rinse BID   mupirocin ointment  1 application Topical BID   polyvinyl alcohol  1 drop Both Eyes BID   sodium chloride flush  10-40 mL Intracatheter Q12H   triamcinolone ointment  1 application Topical BID    Infusions:  sodium chloride Stopped  (02/12/21 1333)   morphine 3 mg/hr (02/13/21 0933)    PRN Medications: sodium chloride, acetaminophen, LORazepam, morphine injection, morphine injection, ondansetron (ZOFRAN) IV  Plan/Discussion:     1. Acute on chronic systolic HF -> cardiogenic shock 2. CHB s/p pacer 3. AKI 4. PAD with critical limb ischemia  He is now comfort care. He continues to struggle with ischemic pain in his digits. Continue to titrate morphine.   Expect in-hospital death in the next 24-48 hours. Once he is comfortable can move to 6N.     Length of Stay: 4   Arvilla Meres MD 02/13/2021, 9:34 AM  Advanced Heart Failure Team Pager [phone] (M-F; 7a - 4p)  Please contact [facility] for night-coverage after hours (4p -7a ) and weekends on [email or web address]

## 2021-02-13 NOTE — Consult Note (Signed)
Consultation Note Date: 02/13/2021   Patient Name: Karl Stevenson  DOB: 1926-06-23  MRN: [id number]  Age / Sex: 87 y.o., male  PCP: Jarome Matin, MD Referring Physician: Quintella Reichert, MD  Reason for Consultation: Establishing goals of care, Psychosocial/spiritual support, Terminal Care, and Withdrawal of life-sustaining treatment  HPI/Patient Profile: 87 y.o. male  admitted on 03/01/2021 with significant past h/o chronic systolic heart failure 2/2 ICM, CAD, severe PAD, HTN, HLD, RBBB and h/o VT, admitted w/ syncope and found to be in CHB w/ AKI, SCr 1.80 on admit (baseline ~1.3). HS trop peaked to 13,000. Cath not perused. Echo showed EF 20-25% (previously 30-35%). RV ok. Underwent PPM implant on 10/3. Post procedure developed profound cardiogenic shock w/ worsening AKI and lactic acidosis. Scr bumped to 2.6. CO2 11. Lactate 6.0. Also w/ runs of NSVT, stated on amio gtt. PICC line placed and found to have severely low Co-ox 28%. Started on milrinone 0.25. Repeat Co-ox 44%. CVP 18 but no dyspnea at rest, requiring supp O2 via [place]. Distal digits cyanotic.   After conversation with heart failure team, patient and family have made decision to shift to full comfort path.  Palliative medicine consult to assist with clarification of goals of care and symptom management   Clinical Assessment and Goals of Care:  This NP Lorinda Creed reviewed medical records, received report from team, assessed the patient and then meet at the patient's bedside along with his two sons to discuss diagnosis, prognosis, GOC, EOL wishes disposition and options.   Concept of Palliative Care was introduced as specialized medical care for people and their families living with serious illness.  If focuses on providing relief from the symptoms and stress of a serious illness.  The goal is to improve quality of life for both the patient and the  family.  Values and goals of care important to patient and family were attempted to be elicited.  Created space and opportunity for family to explore thoughts and feelings regarding current medical situation.  Both sons verbalized a clear understanding of the current medical situation and the associated limited prognosis.  Prognosis is likely hours to days.  Expect hospital death.  Family's main focus for [name] at this time is comfort and dignity.   Education offered on the natural trajectory and expectations at end-of-life. The difference between a aggressive medical intervention path  and a palliative comfort care path for this patient at this time was had.       Questions and concerns addressed.  Patient  encouraged to call with questions or concerns.     PMT will continue to support holistically.              NEXT OF KIN    SUMMARY OF RECOMMENDATIONS    Code Status/Advance Care Planning: DNR Focus of care is comfort, quality and dignity.   Symptom Management:  Pain/Dyspnea: Morphine gtt with titration and bolus options  Anxiety: Ativan 1 mg IV as needed   Palliative Prophylaxis:  Eye Care,  Frequent Pain Assessment, and Oral Care  Additional Recommendations (Limitations, Scope, Preferences): Full Comfort Care  Psycho-social/Spiritual:  Desire for further Chaplaincy support:no-declined Additional Recommendations: Education on Hospice  Prognosis:  Hours - Days  Discharge Planning: Anticipated Hospital Death      Primary Diagnoses: Present on Admission:  Symptomatic bradycardia   I have reviewed the medical record, interviewed the patient and family, and examined the patient. The following aspects are pertinent.  Past Medical History:  Diagnosis Date   AAA (abdominal aortic aneurysm)    a. Ectatic abdominal aorta with fusiform dilitation 3.4x3.4 and moderate thrombus burden 12/2010   Bradycardia    a. previous intolerance to beta blockers - low  dose toprol started 10/2011 for tachycardia but had rash and was discontinued;  b. tolerating low dose propranolol.   Carotid artery occlusion    a. s/p L CEA in 2010;  b. 06/2014 Carotid U/S: stable LICA CEA site w/o significant stenosis bilat.   Chronic systolic heart failure (HCC)    a. 02/2015 Echo:  35-40% (echo 11/2010); c. 02/2015 Echo: EF 20-25%, diff HK, mid-apicalanteroseptal and apical AK, Gr 1 DD, mild MR.   COPD (chronic obstructive pulmonary disease) (HCC)    Coronary artery disease    a. 1969 s/p MI;  b. S/P CABG 1987;  c. S/P redo 2008;  d. 07/2012 Cath: stable->Med Rx, EF 40%; e. 02/2014 MV: anterior and posterolateral/apical infarct with mild peri-infarct ischemia->Med Rx; f. 02/2015 Cath: LM 100d, LAD 100ost, LCX 100ost, OM2 100, VG->OM3->OM4 nl, VG-> LAD nl (Y from VG->OM), VG->OM2 100p.   Crohn's disease (HCC)    a. s/p colon resection   Gout    Hyperlipidemia    Hypertension    Ischemic cardiomyopathy    a. EF 34% (Lexiscan 06/2011); b. 35-40% (echo 11/2010); c. 02/2015 Echo: EF 20-25%, diff HK, mid-apicalanteroseptal and apical AK, Gr 1 DD, mild MR.   Peripheral vascular disease (HCC)    a. Carotid dz s/p L CEA, RAS, AAA, LE dz   RBBB    Renal artery stenosis (HCC)    a. Dopplers 12/2010 - 1-59% R ostial renal artery stenosis, 2 L renal arteries both widely patent   Syncope    Tachycardia    a. 10/2011 - atrial tach vs avnrt - BB started.   Ventricular tachycardia    a. 02/2015 ->amio 200 daily added-->later switched to mexilitene.   Social History   Socioeconomic History   Marital status: Married    Spouse name: Not on file   Number of children: Not on file   Years of education: Not on file   Highest education level: Not on file  Occupational History   Not on file  Tobacco Use   Smoking status: Never   Smokeless tobacco: Never  Vaping Use   Vaping Use: Never used  Substance and Sexual Activity   Alcohol use: No   Drug use: No   Sexual activity: Not on file   Other Topics Concern   Not on file  Social History Narrative   Not on file   Social Determinants of Health   Financial Resource Strain: Not on file  Food Insecurity: Not on file  Transportation Needs: Not on file  Physical Activity: Not on file  Stress: Not on file  Social Connections: Not on file   Family History  Problem Relation Age of Onset   Heart disease Father    Cancer Mother    COPD Mother    Deep vein thrombosis  Brother    COPD Brother    CAD Brother    Anesthesia problems Neg Hx    Scheduled Meds:  Chlorhexidine Gluconate Cloth  6 each Topical Daily   mouth rinse  15 mL Mouth Rinse BID   mupirocin ointment  1 application Topical BID   polyvinyl alcohol  1 drop Both Eyes BID   sodium chloride flush  10-40 mL Intracatheter Q12H   triamcinolone ointment  1 application Topical BID   Continuous Infusions:  sodium chloride Stopped (02/12/21 1333)   morphine 2 mg/hr (02/13/21 0700)   PRN Meds:.sodium chloride, acetaminophen, LORazepam, morphine injection, morphine injection, ondansetron (ZOFRAN) IV Medications Prior to Admission:  Prior to Admission medications   Medication Sig Start Date End Date Taking? Authorizing Provider  acetaminophen (TYLENOL) 500 MG tablet Take 1,000 mg by mouth every 6 (six) hours as needed (pain).    Yes [provider]  aspirin EC 81 MG tablet Take 81 mg by mouth daily.   Yes [provider]  atorvastatin (LIPITOR) 10 MG tablet Take 10 mg by mouth daily. 12/18/19  Yes [provider]  carboxymethylcellulose (REFRESH PLUS) 0.5 % SOLN Place 1 drop into both eyes 4 (four) times daily.   Yes [provider]  cholecalciferol (VITAMIN D3) 25 MCG (1000 UNIT) tablet Take 1,000 Units by mouth daily.   Yes [provider]  clopidogrel (PLAVIX) 75 MG tablet TAKE 1 TABLET EVERY DAY Patient taking differently: Take 75 mg by mouth daily. 11/01/20  Yes [name], MD  fexofenadine (ALLEGRA) 180 MG tablet  Take 180 mg by mouth daily.   Yes [provider]  furosemide (LASIX) 40 MG tablet Take 20 mg by mouth every Monday, Wednesday, and Friday.   Yes [provider]  ipratropium (ATROVENT) 0.03 % nasal spray Place 1 spray into both nostrils daily.   Yes [provider]  isosorbide mononitrate (IMDUR) 120 MG 24 hr tablet Take 1 tablet (120 mg total) by mouth daily. 01/14/21  Yes [name], MD  isosorbide mononitrate (IMDUR) 60 MG 24 hr tablet Take 1 tablet (60 mg total) by mouth daily. 01/14/21  Yes [name], MD  losartan (COZAAR) 25 MG tablet TAKE 1 TABLET TWICE DAILY Patient taking differently: Take 25 mg by mouth in the morning and at bedtime. 07/08/20  Yes [name], MD  mexiletine (MEXITIL) 200 MG capsule TAKE 1 CAPSULE (200 MG TOTAL) BY MOUTH EVERY 12 HOURS. Patient taking differently: Take 200 mg by mouth every 12 (twelve) hours. 10/29/20  Yes [name], MD  Multiple Vitamin (MULTIVITAMIN WITH MINERALS) TABS tablet Take 2 tablets by mouth daily.   Yes [provider]  mupirocin ointment (BACTROBAN) 2 % Apply 1 application topically 2 (two) times daily. Apply to foot   Yes [provider]  nitroGLYCERIN (NITROSTAT) 0.4 MG SL tablet Place 1 tablet (0.4 mg total) under the tongue every 5 (five) minutes as needed for chest pain. 01/31/20  Yes Rosalio Macadamia, NP  OVER THE COUNTER MEDICATION Take 1 capsule by mouth daily. Medication: Nerve Health   Yes [provider]  pantoprazole (PROTONIX) 40 MG tablet TAKE 1 TABLET EVERY DAY Patient taking differently: Take 40 mg by mouth daily. 11/01/20  Yes [name], MD  Polyethyl Glycol-Propyl Glycol (SYSTANE) 0.4-0.3 % SOLN Place 1 drop into both eyes in the morning and at bedtime.   Yes [provider]  polyethylene glycol powder (GLYCOLAX/MIRALAX) powder Take 17 g by mouth at bedtime. Mix in 8  oz liquid and drink 10/15/15  Yes [provider]  ranolazine (RANEXA)  500 MG 12 hr tablet TAKE 2 TABLETS TWICE DAILY Patient taking differently: Take 500 mg by mouth 2 (two) times daily. 09/02/20  Yes [name], MD  spironolactone (ALDACTONE) 25 MG tablet Take 12.5 mg by mouth See admin instructions. Take one tablet by mouth on Monday, Wednesday and Fridays.   Yes [provider]  triamcinolone ointment (KENALOG) 0.1 % Apply 1 application topically 2 (two) times daily. Mix with Mupirocin and apply to head   Yes [provider]  vitamin B-12 (CYANOCOBALAMIN) 1000 MCG tablet Take 2,000 mcg by mouth daily.   Yes [provider]   Allergies  Allergen Reactions   Amoxicillin Diarrhea   Novocain [Procaine Hcl] Other (See Comments)    Cold sweats and very bad headache   Procaine Other (See Comments)    Sweating headache   Metoprolol Hives, Itching and Rash   Review of Systems  Unable to perform ROS: Acuity of condition   Physical Exam Constitutional:      Appearance: He is underweight.  Cardiovascular:     Rate and Rhythm: Normal rate.  Skin:    General: Skin is warm.     Comments: Finger tips ashen   Neurological:     Mental Status: He is lethargic.    Vital Signs: BP 116/62   Pulse (!) 101   Temp 98 F (36.7 C) (Oral)   Resp (!) 25   Ht 5\' 3"  (1.6 m)   Wt 60.9 kg   SpO2 100%   BMI 23.78 kg/m  Pain Scale: 0-10   Pain Score: 3    SpO2: SpO2: 100 % O2 Device:SpO2: 100 % O2 Flow Rate: .O2 Flow Rate (L/min): 2 L/min  IO: Intake/output summary:  Intake/Output Summary (Last 24 hours) at 02/13/2021 0849 Last data filed at 02/13/2021 0700 Gross per 24 hour  Intake 1064.12 ml  Output 185 ml  Net 879.12 ml    LBM: Last BM Date: 02/11/21 Baseline Weight: Weight: 58.9 kg Most recent weight: Weight: 60.9 kg     Palliative Assessment/Data: 20 %    Discussed with 04/13/21 PA-C and bedside RN  Time In: 0800 Time Out: 0910 Time Total: 70 minutes Greater than 50%  of this time was spent counseling and  coordinating care related to the above assessment and plan.  Signed by: 0911, NP   Please contact Palliative Medicine Team phone at [phone] for questions and concerns.  For individual provider: See [phone]

## 2021-02-13 NOTE — Progress Notes (Signed)
CHF input noted and appreciated.  We are available PRN.  Myrla Halsted MD PCCM

## 2021-02-13 NOTE — Progress Notes (Signed)
Electrophysiology Rounding Note  Patient Name: Karl Stevenson Date of Encounter: 02/13/2021  Primary Cardiologist: [name], MD Electrophysiologist: Lewayne Bunting, MD   Subjective   Patient remains on milrinone and morphine gtt started at 2/mg hr this am.   Poor appetite.  Has seen and talked to all family that he has planned to see.  Spoke with his wife on the phone this am who does not plan to come back to hospital. The pain in his fingers started picking up around 0700 , currently a 3-4/10.   Inpatient Medications    Scheduled Meds:  aspirin EC  81 mg Oral Daily   atorvastatin  10 mg Oral Daily   Chlorhexidine Gluconate Cloth  6 each Topical Daily   cholecalciferol  1,000 Units Oral Daily   loratadine  10 mg Oral Daily   mouth rinse  15 mL Mouth Rinse BID   midodrine  10 mg Oral TID WC   mupirocin ointment  1 application Topical BID   pantoprazole  40 mg Oral Daily   polyethylene glycol  17 g Oral QHS   polyvinyl alcohol  1 drop Both Eyes BID   sodium bicarbonate  650 mg Oral QID   sodium chloride flush  10-40 mL Intracatheter Q12H   sodium chloride flush  10-40 mL Intracatheter Q12H   sodium chloride flush  3 mL Intravenous Q12H   triamcinolone ointment  1 application Topical BID   vitamin B-12  2,000 mcg Oral Daily   Continuous Infusions:  sodium chloride 500 mL (03/09/2021 1555)   sodium chloride Stopped (02/12/21 1333)   amiodarone 30 mg/hr (02/13/21 0600)   ceFEPime (MAXIPIME) IV Stopped (02/13/21 0453)   milrinone 0.25 mcg/kg/min (02/13/21 0600)   morphine 2 mg/hr (02/13/21 0600)   norepinephrine (LEVOPHED) Adult infusion     PRN Meds: sodium chloride, sodium chloride, acetaminophen, morphine injection, morphine injection, ondansetron (ZOFRAN) IV, oxyCODONE, sodium chloride flush, sodium chloride flush, sodium chloride flush   Vital Signs    Vitals:   02/13/21 0300 02/13/21 0400 02/13/21 0500 02/13/21 0600  BP: 108/64 116/75 117/75 110/66  Pulse:  92 (!) 101 (!) 102 97  Resp: 16 20 18 17   Temp:      TempSrc:      SpO2: 100% 100% 100% 100%  Weight:   60.9 kg   Height:        Intake/Output Summary (Last 24 hours) at 02/13/2021 0734 Last data filed at 02/13/2021 0600 Gross per 24 hour  Intake 1057.68 ml  Output 535 ml  Net 522.68 ml   Filed Weights   02/11/21 0500 02/11/21 2240 02/13/21 0500  Weight: 58.5 kg 59.2 kg 60.9 kg    Physical Exam    GEN- The patient is elderly and chronically appearing, alert and oriented x 3 today.   Head- normocephalic, atraumatic Eyes-  Sclera clear, conjunctiva pink Ears- hearing intact Oropharynx- clear Neck- supple Lungs- Diminished. Normal work of breathing Heart- Regular rate and rhythm, no murmurs, rubs or gallops GI- soft, NT, ND, + BS Extremities- Bilateral hands with cyanosis into his palms.  Skin- no rash or lesion Psych- flat but appropriate affect.    Labs    CBC Recent Labs    02/12/21 0210 02/13/21 0410  WBC 11.5* 8.2  HGB 10.0* 8.8*  HCT 30.9* 25.8*  MCV 106.9* 103.6*  PLT 84* 68*   Basic Metabolic Panel Recent Labs    04/15/21 1602 02/12/21 0210 02/12/21 0821 02/13/21 0410  NA 136   < >  135 132*  K 5.2*   < > 4.4 4.0  CL 107   < > 103 100  CO2 15*   < > 15* 19*  GLUCOSE 112*   < > 140* 121*  BUN 90*   < > 94* 100*  CREATININE 2.57*   < > 2.48* 2.60*  CALCIUM 8.3*   < > 7.9* 7.7*  MG 2.1  --   --   --    < > = values in this interval not displayed.   Liver Function Tests Recent Labs    02/12/21 0821  AST 2,033*  ALT 1,837*  ALKPHOS 101  BILITOT 1.0  PROT 5.4*  ALBUMIN 2.8*   No results for input(s): LIPASE, AMYLASE in the last 72 hours. Cardiac Enzymes No results for input(s): CKTOTAL, CKMB, CKMBINDEX, TROPONINI in the last 72 hours.   Telemetry    V paced 90-100s at times (personally reviewed)  Radiology    DG CHEST PORT 1 VIEW  Result Date: 02/12/2021 CLINICAL DATA:  Tachycardia EXAM: PORTABLE CHEST 1 VIEW COMPARISON:   02/11/2021 FINDINGS: Cardiac shadow is stable. Pacing device is again seen and stable. Postsurgical changes are noted. Small pleural effusions are noted bilaterally. The degree of vascular congestion has improved somewhat in the interval from the prior exam. Skin folds are noted over the right chest. Mild fluid in the minor fissure is noted as well. No bony abnormality is seen. IMPRESSION: Bilateral effusions right greater than left. Improvement in the degree of vascular congestion when compared with the prior study. Electronically Signed   By: Alcide Clever M.D.   On: 02/12/2021 02:57   [place] EKG SITE RITE  Result Date: 02/12/2021 If Site Rite image not attached, placement could not be confirmed due to current cardiac rhythm.   Patient Profile  Karl Stevenson is a 87 y.o. male with a history of complicated cardiac history including AAA, bradycardia (previously intolerant to BB and now off), Carotid artery disese s/p LCEA, Ischemic DCM with chronic systolic CHF (EF 67-61% on echo 2017), ASCAD s/p remote MI in1969 with CABG 1987 and redo 2008 and last cath 02/2015 with distal LM occlusion, occluded oLAD, occluded oLCx and OME with patent SVG to OM3>OM4, patent SVG to LAD and occluded SVG to OM2 on medical management.  He also has a hx of HTN, HLD, PVD with RAS, chronic RBBB and ischemic VT (initially on Amio and changed to Mexilitene and Ranexa. He is being seen today for the evaluation of Syncope and Advanced AV block at the request of Dr. Mayford Knife.  Assessment & Plan    Shock/ low output Coox > 60% this am but still with ischemic symptoms in his hand, gradually worsening over the morning.  He has completed his visiting and speaking with family and wishes to be made comfortable.   RN to assist him back into bed. Palliative care to assist in initiating comfort care this am. Sons and patient understands the patient will likely pass away in the hospital once milrinone stopped and comfort meds added.  2.   Advanced AV block with syncope s/p [employer] BiV PPM No effusion on bedside echo yesterday Stable pacing  3. ICM s/p NSTEMI Echo 11/2015 LVEF 30-35% -> 20-25% this admission  LHC 02/2015 with severe native CAD with no PCI targets No current chest pain  4. H/o VT Stable on amio  5. PAD Contributing to ischemic symptoms in hands and feet.  Comfort care to be initiated in full this am.  6. Goals of Care Patient understands there are no durable options for survival given his severe shock. He has been made DNR and plans in place to initiate comfort care, likely this am given ongoing ischemic limb symptoms.   Appreciate assistance of CCM and HF teams  For questions or updates, please contact [facility] Please consult [email or web address] for contact info under Cardiology/STEMI.  Signed, Graciella Freer, PA-C  02/13/2021, 7:34 AM

## 2021-02-14 DIAGNOSIS — Z515 Encounter for palliative care: Secondary | ICD-10-CM

## 2021-02-14 DIAGNOSIS — I5022 Chronic systolic (congestive) heart failure: Secondary | ICD-10-CM | POA: Diagnosis not present

## 2021-02-14 DIAGNOSIS — Z7189 Other specified counseling: Secondary | ICD-10-CM | POA: Diagnosis not present

## 2021-02-14 DIAGNOSIS — I442 Atrioventricular block, complete: Secondary | ICD-10-CM | POA: Diagnosis not present

## 2021-02-14 DIAGNOSIS — R570 Cardiogenic shock: Secondary | ICD-10-CM | POA: Diagnosis not present

## 2021-02-14 MED ORDER — LORAZEPAM 2 MG/ML IJ SOLN
1.0000 mg | INTRAMUSCULAR | Status: DC | PRN
  Administered 2021-02-14: 2 mg via INTRAVENOUS
  Filled 2021-02-14: qty 1

## 2021-02-14 MED ORDER — LORAZEPAM 2 MG/ML IJ SOLN
2.0000 mg | Freq: Once | INTRAMUSCULAR | Status: AC
  Administered 2021-02-14: 2 mg via INTRAVENOUS
  Filled 2021-02-14: qty 1

## 2021-02-14 MED ORDER — HYDROMORPHONE HCL 1 MG/ML IJ SOLN
1.0000 mg | INTRAMUSCULAR | Status: DC | PRN
  Administered 2021-02-14 (×2): 2 mg via INTRAVENOUS
  Filled 2021-02-14 (×2): qty 2

## 2021-02-14 MED ORDER — HYDROMORPHONE BOLUS VIA INFUSION: 1.0000 mg | INTRAVENOUS | Status: DC | PRN

## 2021-02-14 MED ORDER — MORPHINE BOLUS VIA INFUSION
4.0000 mg | Freq: Once | INTRAVENOUS | Status: AC
  Administered 2021-02-14: 4 mg via INTRAVENOUS
  Filled 2021-02-14: qty 4

## 2021-02-14 MED ORDER — MORPHINE BOLUS VIA INFUSION
2.0000 mg | INTRAVENOUS | Status: DC | PRN
  Filled 2021-02-14: qty 10

## 2021-02-17 LAB — CULTURE, BLOOD (ROUTINE X 2)
Culture: NO GROWTH
Culture: NO GROWTH
Special Requests: ADEQUATE
Special Requests: ADEQUATE

## 2021-02-17 MED FILL — Lidocaine HCl Local Inj 1%: INTRAMUSCULAR | Qty: 60 | Status: AC

## 2021-02-20 ENCOUNTER — Ambulatory Visit: Payer: Medicare PPO

## 2021-02-25 ENCOUNTER — Telehealth: Payer: Self-pay | Admitting: Cardiovascular Disease

## 2021-02-25 NOTE — Telephone Encounter (Signed)
They want to know if Dr. Excell Seltzer will complete the death certificate and sign it.

## 2021-02-26 NOTE — Telephone Encounter (Signed)
Reply from Dr. Excell Seltzer: "Thanks I just did it, should be taken care of now."

## 2021-03-05 ENCOUNTER — Other Ambulatory Visit: Payer: Medicare PPO

## 2021-03-11 NOTE — Progress Notes (Signed)
Palliative:   Karl Stevenson is comfort care only.  Call from Attending requesting symptom management, as he is moaning and seems uncomfortable. His family is at bedside. Detailed conference with bedside nursing staff related to patient condition and symptom management.  Orders adjusted.     Return later in the morning to find that Karl Stevenson has passed peacefully with family present.  Support given.   Conference with attending, bedside nursing staff related to patient condition and needs.   Plan: Full comfort care, anticipate in hospital death.   25 minutes  Lillia Carmel, NP Palliative Medicine Team  Team Phone [phone] Greater than 50% of this time was spent counseling and coordinating care related to the above assessment and plan.

## 2021-03-11 NOTE — Progress Notes (Signed)
Electrophysiology Rounding Note  Patient Name: Karl Stevenson Date of Encounter: 03/02/2021  Primary Cardiologist: [name], MD Electrophysiologist: Lewayne Bunting, MD   Subjective   Patient remains unresponsive with snoring and occasional gurgling respirations on comfort gtt. Anticipate hospital death.  Sons are present  Inpatient Medications    Scheduled Meds:  Chlorhexidine Gluconate Cloth  6 each Topical Daily   LORazepam  2 mg Intravenous Once   mouth rinse  15 mL Mouth Rinse BID   morphine  4 mg Intravenous Once   mupirocin ointment  1 application Topical BID   polyvinyl alcohol  1 drop Both Eyes BID   sodium chloride flush  10-40 mL Intracatheter Q12H   triamcinolone ointment  1 application Topical BID   Continuous Infusions:  sodium chloride Stopped (02/12/21 1333)   morphine 6 mg/hr (02/28/2021 0529)   PRN Meds: sodium chloride, acetaminophen, LORazepam, morphine injection, morphine injection, ondansetron (ZOFRAN) IV   Vital Signs    Vitals:   02/13/21 2041 03/05/2021 0356 02/12/2021 0500 03/08/2021 0550  BP: (!) 78/49 (!) 79/52  (!) 77/43  Pulse:      Resp: 15 15  15   Temp: 98.4 F (36.9 C)     TempSrc: Axillary     SpO2:      Weight:   60.6 kg   Height:        Intake/Output Summary (Last 24 hours) at 02/17/2021 0708 Last data filed at 02/08/2021 0500 Gross per 24 hour  Intake 188.74 ml  Output 85 ml  Net 103.74 ml   Filed Weights   02/13/21 0500 02/13/21 1400 03/09/2021 0500  Weight: 60.9 kg 60.9 kg 60.6 kg    Physical Exam    GEN- The patient is unresponsive on comfort gtt Lungs- Diminished with crackles.  Heart- Somewhat irregular with known ectopy.  GI- soft Extremities- cool  Neuro- Unresponsive on comfort gtt.   Labs    CBC Recent Labs    02/12/21 0210 02/13/21 0410  WBC 11.5* 8.2  HGB 10.0* 8.8*  HCT 30.9* 25.8*  MCV 106.9* 103.6*  PLT 84* 68*   Basic Metabolic Panel Recent Labs    04/15/21 1602 02/12/21 0210  02/12/21 0821 02/13/21 0410  NA 136   < > 135 132*  K 5.2*   < > 4.4 4.0  CL 107   < > 103 100  CO2 15*   < > 15* 19*  GLUCOSE 112*   < > 140* 121*  BUN 90*   < > 94* 100*  CREATININE 2.57*   < > 2.48* 2.60*  CALCIUM 8.3*   < > 7.9* 7.7*  MG 2.1  --   --   --    < > = values in this interval not displayed.   Liver Function Tests Recent Labs    02/12/21 0821  AST 2,033*  ALT 1,837*  ALKPHOS 101  BILITOT 1.0  PROT 5.4*  ALBUMIN 2.8*   No results for input(s): LIPASE, AMYLASE in the last 72 hours. Cardiac Enzymes No results for input(s): CKTOTAL, CKMB, CKMBINDEX, TROPONINI in the last 72 hours.   Telemetry     (personally reviewed)  Radiology    04/14/21 EKG SITE RITE  Result Date: 02/12/2021 If Site Rite image not attached, placement could not be confirmed due to current cardiac rhythm.   Patient Profile     Karl Stevenson is a 87 y.o. male with a history of complicated cardiac history including AAA, bradycardia (previously intolerant to  BB and now off), Carotid artery disese s/p LCEA, Ischemic DCM with chronic systolic CHF (EF 92-42% on echo 2017), ASCAD s/p remote MI in1969 with CABG 1987 and redo 2008 and last cath 02/2015 with distal LM occlusion, occluded oLAD, occluded oLCx and OME with patent SVG to OM3>OM4, patent SVG to LAD and occluded SVG to OM2 on medical management.  He also has a hx of HTN, HLD, PVD with RAS, chronic RBBB and ischemic VT (initially on Amio and changed to Mexilitene and Ranexa. He is being seen today for the evaluation of Syncope and Advanced AV block at the request of Dr. Mayford Knife.  Assessment & Plan    Shock/ low output He is now comfort care and anticipating hospital death.   2.  Advanced AV block with syncope s/p Medtronic BiV PPM No effusion on bedside echo 02/12/2021 Stable pacing, not dependent on previous check   3. ICM s/p NSTEMI Echo 11/2015 LVEF 30-35% -> 20-25% this admission  LHC 02/2015 with severe native CAD with no PCI  targets Comfort care ongoing.    4. H/o VT Comfort care ongoing.  Amio stopped.   5. PAD Contributing to ischemic symptoms in hands and feet.  Comfort care ongoing   6. Goals of Care Pt remains unresponsive and on comfort gtts.  Family understands there are no durable options for survival and we are anticipating hospital death.  He has snoring and gurgling respirations this am. Will give his prn boluses as discussed with Palliative Care Team.   Appreciate palliative team support.   For questions or updates, please contact [facility] Please consult [email or web address] for contact info under Cardiology/STEMI.  Signed, Graciella Freer, PA-C  02/10/2021, 7:08 AM

## 2021-03-11 NOTE — Death Summary Note (Addendum)
DEATH SUMMARY   Patient Details  Name: Karl Stevenson MRN: [id number] DOB: November 07, 1926  Admission/Discharge Information   Admit Date:  11-Feb-2021  Date of Death: Date of Death: 02-16-21  Time of Death: Time of Death: 08/25/1056  Length of Stay: 5  Referring Physician: Jarome Matin, MD   Reason(s) for Hospitalization  Syncope Symptomatic bradycardia in setting of intermittent complete heart block  Diagnoses  Admit Diagnosis: Chest Pain Cause of death: Cardiogenic Shock Secondary Diagnoses :  Symptomatic bradycardia Complete Heart Block s/p Pacemaker Acute renal failure Shock liver Peripheral arterial disease with critical limb ischemia Hypotension  Summary of Hospital Course (including significant findings, care, treatment, consults, services provided and events leading to death):  Karl Stevenson was a 87 y.o. year old male with a history of complicated cardiac history including AAA, bradycardia (previously intolerant to BB and now off), Carotid artery disese s/p LCEA, Ischemic DCM with chronic systolic CHF (EF 67-12% on echo 26-Aug-2015), ASCAD s/p remote MI in1969 with CABG 08-25-1985 and redo Aug 26, 2006 and last cath 02/2015 with distal LM occlusion, occluded oLAD, occluded oLCx and OME with patent SVG to OM3>OM4, patent SVG to LAD and occluded SVG to OM2 on medical management.  He also had a hx of HTN, HLD, PVD with RAS, chronic RBBB and ischemic VT (initially on Amio and changed to Mexilitene and Ranexa.   He was admitted with syncope and advanced AV block. He had chest pain with Troponin elevation up to 11k. With known CAD not amenable to PCI, no further ischemic work up pursued.  Pt had significant pauses and intermittent CHB on tele which were symptomatic. He was taken urgently for temp pacer.   Noted recent history of osteomyelitis, but completed course of IV ABX and no s/s infection this admission.   He was hemodynamically stable on 10/3 and offered pacing, understanding that in his  condition his likely survival without pacing would be days to weeks, and if CHB continued pacing was likely his only durable option for survival.  Pt had Medtronic BiV PPM implanted 02/18/2021.  Pt was relatively stable 02/11/21 symptomatically, but was noted to have hypotension and decreased urine output throughout the day. This was felt to be in part due to his HB and wide complex tachycardias he was having in the 120-130 range. Was once paced out by his pacemaker by the EP team. Labwork that evening showed AKI and IVF was started.   Am of 10/5 pt noted to have worsening SOB and cyanosis of hands and feet along with "pins and needles" sensation that progressed to pain. CCM consulted who placed PICC which showed severe low output.  CHF team consulted as well. Pt placed on palliative milrinone and levophed in an effort to salvage and potentially get the patient home with Hospice. In family meeting he was changed to DNR. Pt remained active and alert.    The am of 10/6 pt had continued ischemic limb pain and SOB. Patient and family made decision to go full comfort care. He was started on morphine gtt with prn boluses for pain or dyspnea. He intermittently received these throughout the day.   Overnight into 02-17-23 the patient became increasingly dyspneic with occasional gurgling respirations and sounds of his discomfort. His comfort care was escalated in conversations with Palliative medicine team.   Pt became pulseless and apneic shortly after 1045 and providers were called to the room.  Patients was auscultated for 60 seconds by nursing without lung or heart sounds, and was  pronounced dead at 1058 am EST on full comfort measures with family at bedside.    Pertinent Labs and Studies  Significant Diagnostic Studies DG ELBOW COMPLETE RIGHT (3+VIEW)  Result Date: 01/21/2021 CLINICAL DATA:  Elbow injury. Nonhealing laceration of right elbow for 2 months. History of peripheral vascular EXAM: RIGHT ELBOW -  COMPLETE 3+ VIEW COMPARISON:  None. FINDINGS: There is no evidence of fracture, dislocation, or joint effusion. There is no evidence of arthropathy or other focal bone abnormality. Soft tissues are unremarkable. IMPRESSION: Negative. Electronically Signed   By: Tish Frederickson M.D.   On: 01/21/2021 00:02   CARDIAC CATHETERIZATION  Result Date: 02/20/2021 Successful placement of temporary transvenous pacemaker via right internal jugular vein.   EP PPM/ICD IMPLANT  Result Date: 03/06/2021 Conclusion: Successful though very difficult insertion of a biventricular pacemaker after initial attempts to place a left bundle area pacing lead were unsuccessful.  The patient tolerated the procedure well.  He had no underlying escape rhythm after the end of the procedure. Lewayne Bunting, MD   DG CHEST PORT 1 VIEW  Result Date: 02/12/2021 CLINICAL DATA:  Tachycardia EXAM: PORTABLE CHEST 1 VIEW COMPARISON:  02/11/2021 FINDINGS: Cardiac shadow is stable. Pacing device is again seen and stable. Postsurgical changes are noted. Small pleural effusions are noted bilaterally. The degree of vascular congestion has improved somewhat in the interval from the prior exam. Skin folds are noted over the right chest. Mild fluid in the minor fissure is noted as well. No bony abnormality is seen. IMPRESSION: Bilateral effusions right greater than left. Improvement in the degree of vascular congestion when compared with the prior study. Electronically Signed   By: Alcide Clever M.D.   On: 02/12/2021 02:57   DG Chest Port 1 View  Result Date: 02/11/2021 CLINICAL DATA:  Sore arm and weakness post pacer placement EXAM: PORTABLE CHEST 1 VIEW COMPARISON:  02/20/2021 FINDINGS: LEFT-sided pacer overlies normal cardiac silhouette. Midline sternotomy. Small RIGHT effusion noted. Mild central venous congestion. No pneumothorax. IMPRESSION: LEFT-sided pacer placement without complication RIGHT effusion and  central venous congestion.  Electronically Signed   By: Genevive Bi M.D.   On: 02/11/2021 09:17   DG Chest Portable 1 View  Result Date: 2021-02-20 CLINICAL DATA:  Syncope. EXAM: PORTABLE CHEST 1 VIEW COMPARISON:  11/11/2020 FINDINGS: Sternotomy wires unchanged. Lungs are adequately inflated demonstrate hazy opacification of the perihilar vessels likely mild vascular congestion. No significant effusion. Cardiomediastinal silhouette and remainder of the exam is unchanged. IMPRESSION: Mild vascular congestion. Electronically Signed   By: Elberta Fortis M.D.   On: 02/20/2021 16:04   ECHOCARDIOGRAM COMPLETE  Result Date: 02/13/2021    ECHOCARDIOGRAM REPORT   Patient Name:   Karl Stevenson Date of Exam: 02/13/2021 Medical Rec #:  [id number]       Height:       63.0 in Accession #:    [id number]      Weight:       129.9 lb Date of Birth:  1926/08/11      BSA:          1.609 m Patient Age:    89 years        BP:           109/69 mmHg Patient Gender: M               HR:           59 bpm. Exam Location:  Inpatient Procedure: 2D Echo, Cardiac Doppler and Color Doppler  Indications:    Syncope  History:        Patient has prior history of Echocardiogram examinations, most                 recent 11/19/2015. Cardiomyopathy and CHF, CAD and Previous                 Myocardial Infarction, Prior CABG, Carotid Disease and COPD,                 Arrythmias:Bradycardia; Risk Factors:Hypertension and                 Dyslipidemia.  Sonographer:    Lavenia Atlas RDCS Referring Phys: [phone] PETER M [place] IMPRESSIONS  1. Global hypokinesis with akinesis of the inferolateral wall and apex; overall severe LV dysfunction.  2. Left ventricular ejection fraction, by estimation, is 20 to 25%. The left ventricle has severely decreased function. The left ventricle demonstrates regional wall motion abnormalities (see scoring diagram/findings for description). Left ventricular diastolic parameters are consistent with Grade III diastolic dysfunction (restrictive).   3. Right ventricular systolic function is normal. The right ventricular size is normal. There is moderately elevated pulmonary artery systolic pressure.  4. The mitral valve is normal in structure. Mild mitral valve regurgitation. No evidence of mitral stenosis.  5. The aortic valve is tricuspid. Aortic valve regurgitation is not visualized. No aortic stenosis is present.  6. The inferior vena cava is dilated in size with <50% respiratory variability, suggesting right atrial pressure of 15 mmHg. FINDINGS  Left Ventricle: Left ventricular ejection fraction, by estimation, is 20 to 25%. The left ventricle has severely decreased function. The left ventricle demonstrates regional wall motion abnormalities. The left ventricular internal cavity size was normal  in size. There is no left ventricular hypertrophy. Left ventricular diastolic parameters are consistent with Grade III diastolic dysfunction (restrictive). Right Ventricle: The right ventricular size is normal. Right ventricular systolic function is normal. There is moderately elevated pulmonary artery systolic pressure. The tricuspid regurgitant velocity is 2.93 m/s, and with an assumed right atrial pressure of 15 mmHg, the estimated right ventricular systolic pressure is 49.3 mmHg. Left Atrium: Left atrial size was normal in size. Right Atrium: Right atrial size was normal in size. Pericardium: There is no evidence of pericardial effusion. Mitral Valve: The mitral valve is normal in structure. Mild mitral annular calcification. Mild mitral valve regurgitation. No evidence of mitral valve stenosis. Tricuspid Valve: The tricuspid valve is normal in structure. Tricuspid valve regurgitation is mild . No evidence of tricuspid stenosis. Aortic Valve: The aortic valve is tricuspid. Aortic valve regurgitation is not visualized. No aortic stenosis is present. Pulmonic Valve: The pulmonic valve was normal in structure. Pulmonic valve regurgitation is trivial. No evidence  of pulmonic stenosis. Aorta: The aortic root is normal in size and structure. Venous: The inferior vena cava is dilated in size with less than 50% respiratory variability, suggesting right atrial pressure of 15 mmHg. IAS/Shunts: No atrial level shunt detected by color flow Doppler. Additional Comments: Global hypokinesis with akinesis of the inferolateral wall and apex; overall severe LV dysfunction.  LEFT VENTRICLE PLAX 2D LVIDd:         5.20 cm      Diastology LVIDs:         4.60 cm      LV e' lateral:   4.46 cm/s LV PW:         1.00 cm      LV E/e' lateral: 25.3 LV  IVS:        0.80 cm LVOT diam:     2.10 cm LV SV:         42 LV SV Index:   26 LVOT Area:     3.46 cm  LV Volumes (MOD) LV vol d, MOD A4C: 143.0 ml LV vol s, MOD A4C: 96.4 ml LV SV MOD A4C:     143.0 ml RIGHT VENTRICLE RV Basal diam:  3.10 cm TAPSE (M-mode): 1.7 cm LEFT ATRIUM             Index       RIGHT ATRIUM           Index LA diam:        3.30 cm 2.05 cm/m  RA Area:     14.30 cm LA Vol (A2C):   54.1 ml 33.62 ml/m RA Volume:   38.40 ml  23.86 ml/m LA Vol (A4C):   48.1 ml 29.89 ml/m LA Biplane Vol: 51.1 ml 31.75 ml/m  AORTIC VALVE LVOT Vmax:   72.50 cm/s LVOT Vmean:  44.300 cm/s LVOT VTI:    0.122 m  AORTA Ao Root diam: 2.80 cm MITRAL VALVE                TRICUSPID VALVE MV Area (PHT): 12.64 cm    TR Peak grad:   34.3 mmHg MV Decel Time: 60 msec      TR Vmax:        293.00 cm/s MV E velocity: 113.00 cm/s MV A velocity: 136.00 cm/s  SHUNTS MV E/A ratio:  0.83         Systemic VTI:  0.12 m                             Systemic Diam: 2.10 cm Olga Millers MD Electronically signed by Olga Millers MD Signature Date/Time: 03/01/2021/12:38:10 PM    Final    [place] EKG SITE RITE  Result Date: 02/12/2021 If Site Rite image not attached, placement could not be confirmed due to current cardiac rhythm.   Microbiology Recent Results (from the past 240 hour(s))  Resp Panel by RT-PCR (Flu A&B, Covid) Nasopharyngeal Swab     Status: None   Collection  Time: 02-23-2021  4:26 PM   Specimen: Nasopharyngeal Swab; Nasopharyngeal(NP) swabs in vial transport medium  Result Value Ref Range Status   SARS Coronavirus 2 by RT PCR NEGATIVE NEGATIVE Final    Comment: (NOTE) SARS-CoV-2 target nucleic acids are NOT DETECTED.  The SARS-CoV-2 RNA is generally detectable in upper respiratory specimens during the acute phase of infection. The lowest concentration of SARS-CoV-2 viral copies this assay can detect is 138 copies/mL. A negative result does not preclude SARS-Cov-2 infection and should not be used as the sole basis for treatment or other patient management decisions. A negative result may occur with  improper specimen collection/handling, submission of specimen other than nasopharyngeal swab, presence of viral mutation(s) within the areas targeted by this assay, and inadequate number of viral copies(<138 copies/mL). A negative result must be combined with clinical observations, patient history, and epidemiological information. The expected result is Negative.  Fact Sheet for Patients:  BloggerCourse.com  Fact Sheet for Healthcare Providers:  SeriousBroker.it  This test is no t yet approved or cleared by the [place] FDA and  has been authorized for detection and/or diagnosis of SARS-CoV-2 by FDA under an Emergency Use Authorization (EUA). This EUA will remain  in effect (meaning this test can be used) for the duration of the COVID-19 declaration under Section 564(b)(1) of the Act, 21 U.S.C.section 360bbb-3(b)(1), unless the authorization is terminated  or revoked sooner.       Influenza A by PCR NEGATIVE NEGATIVE Final   Influenza B by PCR NEGATIVE NEGATIVE Final    Comment: (NOTE) The Xpert Xpress SARS-CoV-2/FLU/RSV plus assay is intended as an aid in the diagnosis of influenza from Nasopharyngeal swab specimens and should not be used as a sole basis for treatment. Nasal washings  and aspirates are unacceptable for Xpert Xpress SARS-CoV-2/FLU/RSV testing.  Fact Sheet for Patients: BloggerCourse.com  Fact Sheet for Healthcare Providers: SeriousBroker.it  This test is not yet approved or cleared by the [place] FDA and has been authorized for detection and/or diagnosis of SARS-CoV-2 by FDA under an Emergency Use Authorization (EUA). This EUA will remain in effect (meaning this test can be used) for the duration of the COVID-19 declaration under Section 564(b)(1) of the Act, 21 U.S.C. section 360bbb-3(b)(1), unless the authorization is terminated or revoked.  Performed at [facility], [address], [place], [place] [address]   MRSA Next Gen by PCR, Nasal     Status: None   Collection Time: 02/22/2021  6:01 PM   Specimen: Nasal Mucosa; Nasal Swab  Result Value Ref Range Status   MRSA by PCR Next Gen NOT DETECTED NOT DETECTED Final    Comment: (NOTE) The GeneXpert MRSA Assay (FDA approved for NASAL specimens only), is one component of a comprehensive MRSA colonization surveillance program. It is not intended to diagnose MRSA infection nor to guide or monitor treatment for MRSA infections. Test performance is not FDA approved in patients less than 61 years old. Performed at [facility], [address] [place], [place] [address]   Surgical PCR screen     Status: None   Collection Time: 02/15/2021 11:15 AM   Specimen: Nasal Mucosa; Nasal Swab  Result Value Ref Range Status   MRSA, PCR NEGATIVE NEGATIVE Final   Staphylococcus aureus NEGATIVE NEGATIVE Final    Comment: (NOTE) The Xpert SA Assay (FDA approved for NASAL specimens in patients 67 years of age and older), is one component of a comprehensive surveillance program. It is not intended to diagnose infection nor to guide or monitor treatment. Performed at [facility], [address], [place], [place] [address]   Culture,  blood (routine x 2)     Status: None (Preliminary result)   Collection Time: 02/12/21  4:19 AM   Specimen: BLOOD  Result Value Ref Range Status   Specimen Description BLOOD BLOOD RIGHT WRIST  Final   Special Requests AEROBIC BOTTLE ONLY Blood Culture adequate volume  Final   Culture   Final    NO GROWTH 1 DAY Performed at [facility], [address] [place], [place] [address]    Report Status PENDING  Incomplete  Culture, blood (routine x 2)     Status: None (Preliminary result)   Collection Time: 02/12/21  4:25 AM   Specimen: BLOOD RIGHT HAND  Result Value Ref Range Status   Specimen Description BLOOD RIGHT HAND  Final   Special Requests AEROBIC BOTTLE ONLY Blood Culture adequate volume  Final   Culture   Final    NO GROWTH 1 DAY Performed at [facility], [address], [place], [place] [address]    Report Status PENDING  Incomplete    Lab Basic  Metabolic Panel: Recent Labs  Lab 02/12/2021 1500 02/08/2021 1536 02/26/2021 0134 02/11/21 1602 02/12/21 0210 02/12/21 0821 02/13/21 0410  NA 137   < > 136 136 134* 135 132*  K 3.9   < > 4.2 5.2* 4.6 4.4 4.0  CL 107   < > 106 107 107 103 100  CO2 17*  --  19* 15* 11* 15* 19*  GLUCOSE 143*   < > 140* 112* 95 140* 121*  BUN 63*   < > 60* 90* 95* 94* 100*  CREATININE 1.88*   < > 1.74* 2.57* 2.51* 2.48* 2.60*  CALCIUM 8.4*  --  8.9 8.3* 8.1* 7.9* 7.7*  MG 1.3*  --  2.2 2.1  --   --   --    < > = values in this interval not displayed.   Liver Function Tests: Recent Labs  Lab 02/12/21 0821  AST 2,033*  ALT 1,837*  ALKPHOS 101  BILITOT 1.0  PROT 5.4*  ALBUMIN 2.8*   No results for input(s): LIPASE, AMYLASE in the last 168 hours. No results for input(s): AMMONIA in the last 168 hours. CBC: Recent Labs  Lab 02/17/2021 1500 03/01/2021 1536 02/08/2021 0134 02/11/21 0117 02/12/21 0210 02/13/21 0410  WBC 11.1*  --  10.3 12.7* 11.5* 8.2  HGB 10.7* 11.6* 10.1* 10.7* 10.0* 8.8*  HCT 32.8* 34.0* 30.9* 32.2* 30.9*  25.8*  MCV 108.6*  --  106.6* 105.6* 106.9* 103.6*  PLT 144*  --  132* 110* 84* 68*   Cardiac Enzymes: No results for input(s): CKTOTAL, CKMB, CKMBINDEX, TROPONINI in the last 168 hours. Sepsis Labs: Recent Labs  Lab 03/07/2021 0134 02/11/21 0117 02/12/21 0210 02/12/21 0243 02/12/21 0430 02/12/21 0710 02/12/21 0821 02/13/21 0410  PROCALCITON  --   --   --   --  0.74  --  0.78 1.29  WBC 10.3 12.7* 11.5*  --   --   --   --  8.2  LATICACIDVEN  --   --   --  5.6* 6.0* 3.9*  --   --    Duration of Discharge Encounter: Greater than 30 minutes including physician time.  Mariam Dollar Nevae Pinnix 02/19/2021, 11:33 AM

## 2021-03-11 NOTE — Progress Notes (Signed)
Advanced Heart Failure Rounding Note   Subjective:    On morphine drip. Unresponsive but grunting at times and appears occasionally uncomfortable   Objective:   Weight Range:  Vital Signs:   Temp:  [98.4 F (36.9 C)] 98.4 F (36.9 C) (10/06 2041) Resp:  [15-18] 15 (10/07 0900) BP: (77-79)/(43-52) 77/43 (10/07 0550) Weight:  [60.6 kg-60.9 kg] 60.6 kg (10/07 0500) Last BM Date: 02/11/21  Weight change: Filed Weights   02/13/21 0500 02/13/21 1400 March 11, 2021 0500  Weight: 60.9 kg 60.9 kg 60.6 kg    Intake/Output:   Intake/Output Summary (Last 24 hours) at 03-11-21 0939 Last data filed at 03/11/2021 0900 Gross per 24 hour  Intake 203.04 ml  Output 35 ml  Net 168.04 ml      Physical Exam: General: Elderly male. Sedated grunting at times  HEENT: normal Neck: supple. + JVD Carotids 2+ bilat; no bruits. No lymphadenopathy or thryomegaly appreciated. Cor: PMI nondisplaced. Regular tachy Lungs: occasional cheyne stokes Abdomen: soft, nontender, nondistended. No hepatosplenomegaly. No bruits or masses. Good bowel sounds. Extremities: + cyanosis. No edema Neuro: unresponsive   Telemetry: LV paced at 110 Personally reviewed   Labs: Basic Metabolic Panel: Recent Labs  Lab 02/15/2021 1500 02/22/2021 1536 02/21/2021 0134 02/11/21 1602 02/12/21 0210 02/12/21 0821 02/13/21 0410  NA 137   < > 136 136 134* 135 132*  K 3.9   < > 4.2 5.2* 4.6 4.4 4.0  CL 107   < > 106 107 107 103 100  CO2 17*  --  19* 15* 11* 15* 19*  GLUCOSE 143*   < > 140* 112* 95 140* 121*  BUN 63*   < > 60* 90* 95* 94* 100*  CREATININE 1.88*   < > 1.74* 2.57* 2.51* 2.48* 2.60*  CALCIUM 8.4*  --  8.9 8.3* 8.1* 7.9* 7.7*  MG 1.3*  --  2.2 2.1  --   --   --    < > = values in this interval not displayed.     Liver Function Tests: Recent Labs  Lab 02/12/21 0821  AST 2,033*  ALT 1,837*  ALKPHOS 101  BILITOT 1.0  PROT 5.4*  ALBUMIN 2.8*    No results for input(s): LIPASE, AMYLASE in the  last 168 hours. No results for input(s): AMMONIA in the last 168 hours.  CBC: Recent Labs  Lab 02/28/2021 1500 03/09/2021 1536 02/16/2021 0134 02/11/21 0117 02/12/21 0210 02/13/21 0410  WBC 11.1*  --  10.3 12.7* 11.5* 8.2  HGB 10.7* 11.6* 10.1* 10.7* 10.0* 8.8*  HCT 32.8* 34.0* 30.9* 32.2* 30.9* 25.8*  MCV 108.6*  --  106.6* 105.6* 106.9* 103.6*  PLT 144*  --  132* 110* 84* 68*     Cardiac Enzymes: No results for input(s): CKTOTAL, CKMB, CKMBINDEX, TROPONINI in the last 168 hours.  BNP: BNP (last 3 results) No results for input(s): BNP in the last 8760 hours.  ProBNP (last 3 results) No results for input(s): PROBNP in the last 8760 hours.    Other results:  Imaging: No results found.   Medications:     Scheduled Medications:  Chlorhexidine Gluconate Cloth  6 each Topical Daily   mouth rinse  15 mL Mouth Rinse BID   mupirocin ointment  1 application Topical BID   polyvinyl alcohol  1 drop Both Eyes BID   sodium chloride flush  10-40 mL Intracatheter Q12H   triamcinolone ointment  1 application Topical BID    Infusions:  sodium chloride Stopped (02/12/21  1333)   morphine 10 mg/hr (02/22/2021 0900)    PRN Medications: sodium chloride, acetaminophen, HYDROmorphone (DILAUDID) injection, LORazepam, morphine injection, morphine injection, morphine, ondansetron (ZOFRAN) IV  Plan/Discussion:     1. Acute on chronic systolic HF -> cardiogenic shock 2. CHB s/p pacer 3. AKI 4. PAD with critical limb ischemia  He is now comfort care and actively dying. On morphine gtt. Appears to have some periods of discomfort. Titrate drips as needed. PPM interrogated with EP service he is LV pacing. CHB with junctional escape in 40s.  D/w family. They are concerned he is suffering. We discussed debate around deactivating PPM. They would like device deactivate. I will deactivate once both his sons are here.      Length of Stay: 5   Arvilla Meres MD 02/08/2021, 9:39  AM  Advanced Heart Failure Team Pager [phone] (M-F; 7a - 4p)  Please contact [facility] for night-coverage after hours (4p -7a ) and weekends on [email or web address]

## 2021-03-11 NOTE — Progress Notes (Signed)
Paged for patient pulseless and non-breathing.   No heart/lung sounds x 60 seconds auscultated by RN.   Time of Death 10:58 EST, 2021/03/10. Family present at bedside.   Casimiro Needle [address] [place], PA-C  03/10/2021 11:10 AM

## 2021-03-11 DEATH — deceased

## 2021-03-14 ENCOUNTER — Ambulatory Visit: Payer: Medicare PPO | Admitting: Podiatry

## 2021-04-15 ENCOUNTER — Ambulatory Visit: Payer: Medicare PPO | Admitting: Physician Assistant

## 2021-05-20 ENCOUNTER — Encounter: Payer: Medicare PPO | Admitting: Internal Medicine

## 2021-11-14 IMAGING — CR DG ELBOW COMPLETE 3+V*R*
4 series · 4 of 4 positions shown · non-contrast
Comparison: None.

CLINICAL DATA: [AGE] with right elbow injury and laceration.

EXAM:
RIGHT ELBOW - COMPLETE 3+ VIEW

[elbow ap]
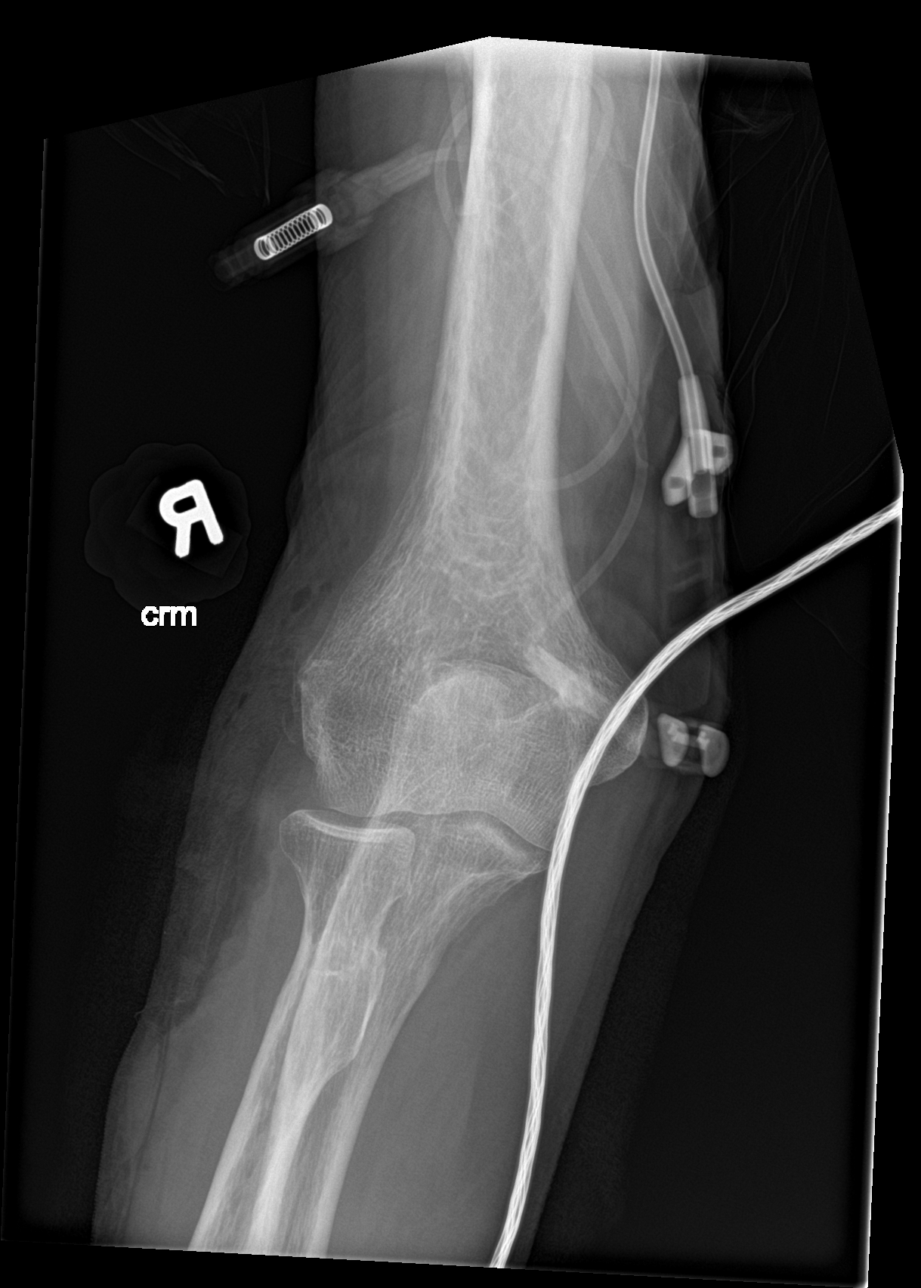

[elbow obl (1 of 2)]
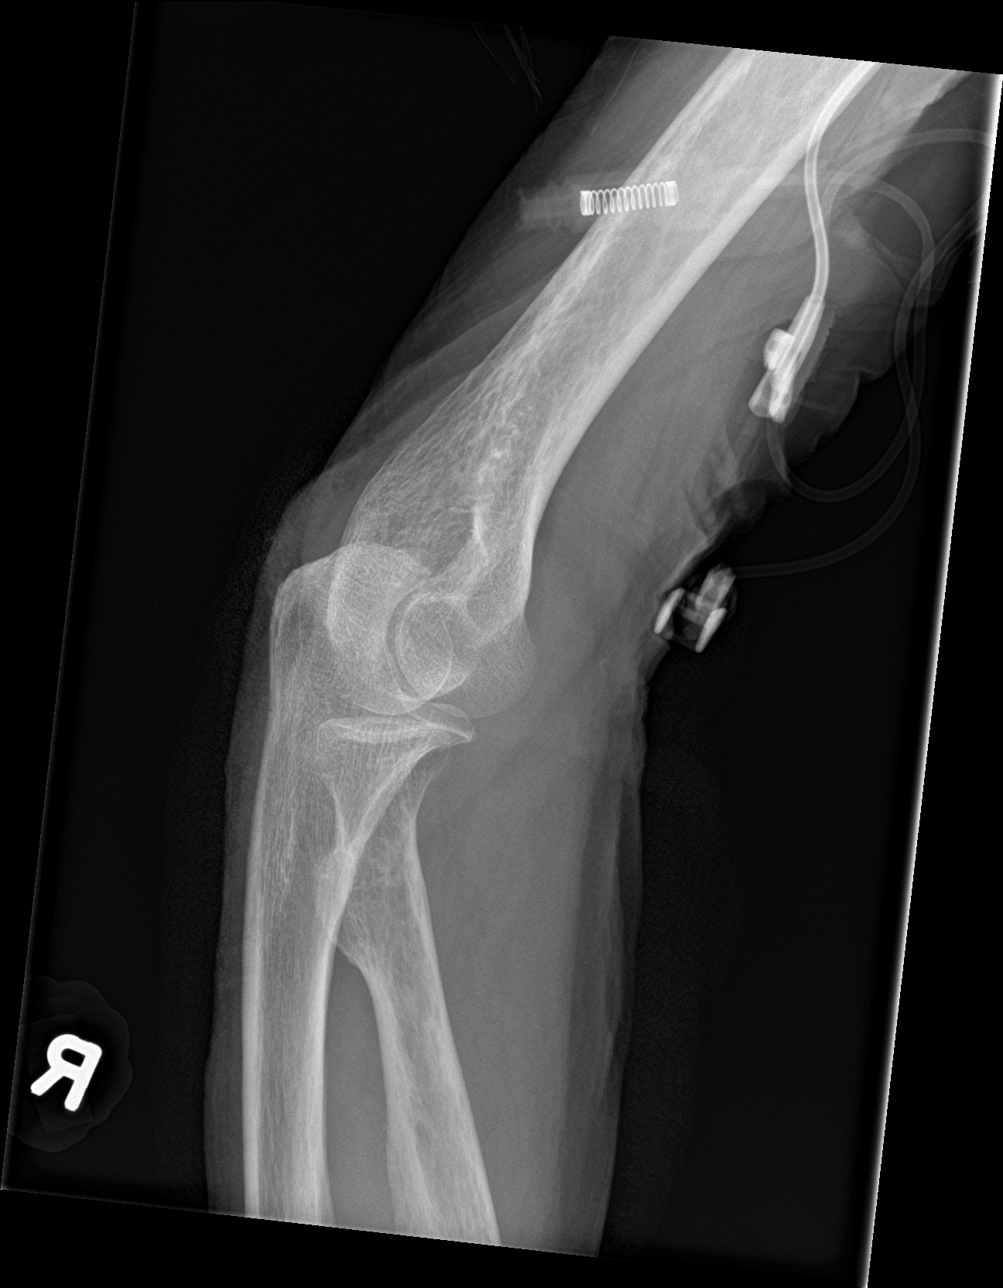

[elbow obl (2 of 2)]
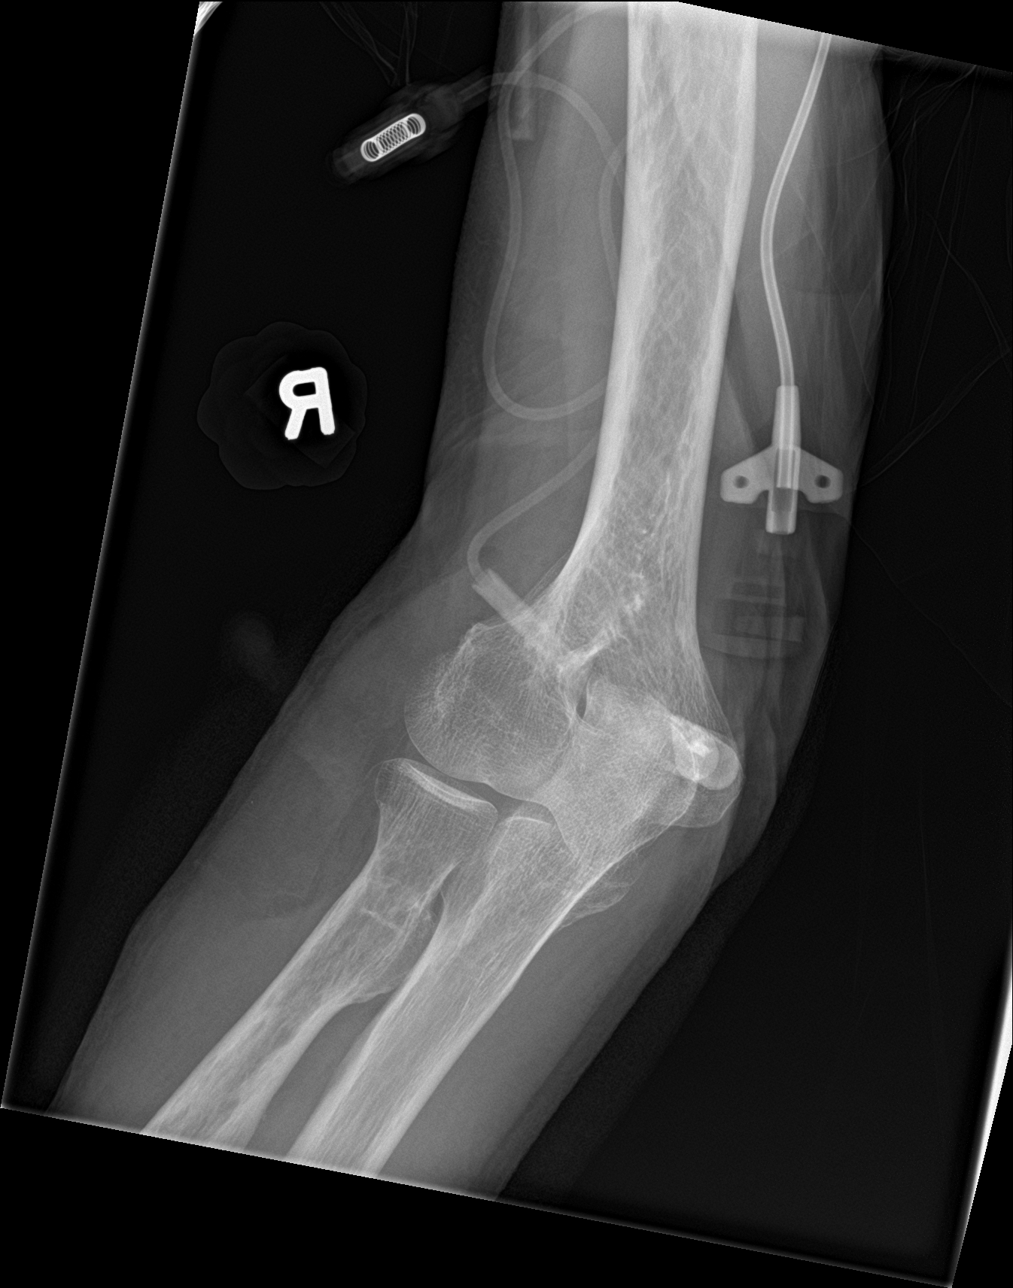

[elbow lat]
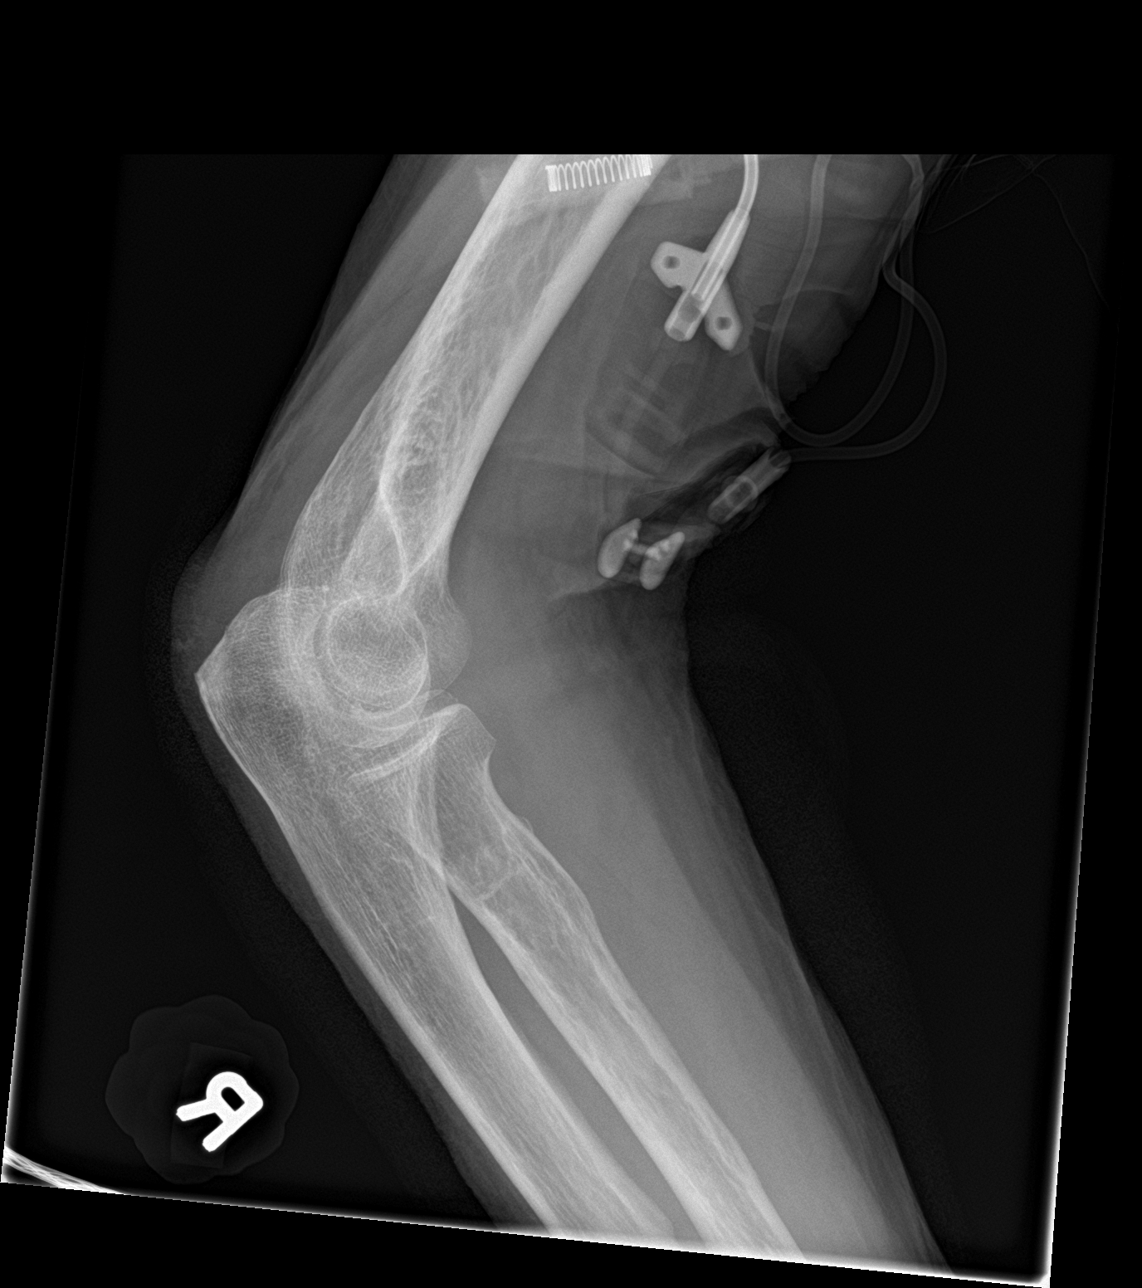

[4 of 4 positions shown; findings below may reference images not displayed]

FINDINGS: Soft tissue irregularity and lucency at the elbow and proximal
forearm is compatible with history of laceration. Patient has a PICC
line in the right upper arm. Negative for fracture or dislocation.
No large joint effusion.
IMPRESSION: No acute bone abnormality to the right elbow.

Soft tissue irregularity is compatible with known laceration.

## 2021-11-14 IMAGING — CT CT CERVICAL SPINE W/O CM
2 series · 13 of 28 positions shown, 16 images · non-contrast
Comparison: 04/07/2020

CLINICAL DATA: Fell at home.  On blood thinners.

EXAM:
CT HEAD WITHOUT CONTRAST
CT CERVICAL SPINE WITHOUT CONTRAST
TECHNIQUE: Multidetector CT imaging of the head and cervical spine was
performed following the standard protocol without intravenous
contrast. Multiplanar CT image reconstructions of the cervical spine
were also generated.

[Series 4: c spine bone · axial · 0.27mm/px · z∈[-272,-118]mm · 8 of 93 slices shown, 10 images]
[im 8/93  soft-tissue]
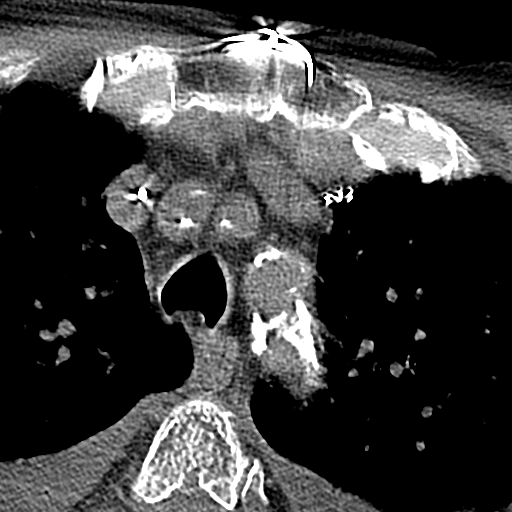
[im 8/93  bone]
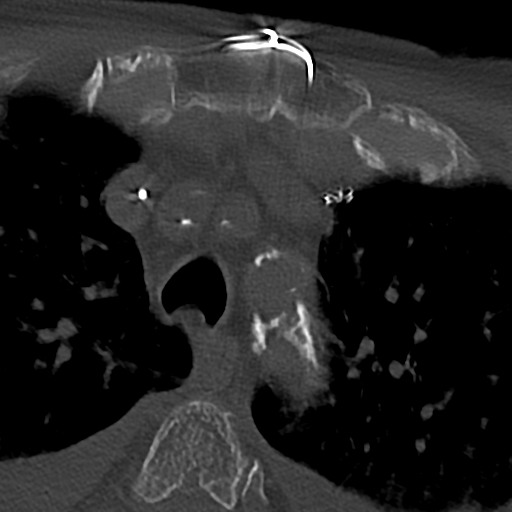
[im 22/93  bone]
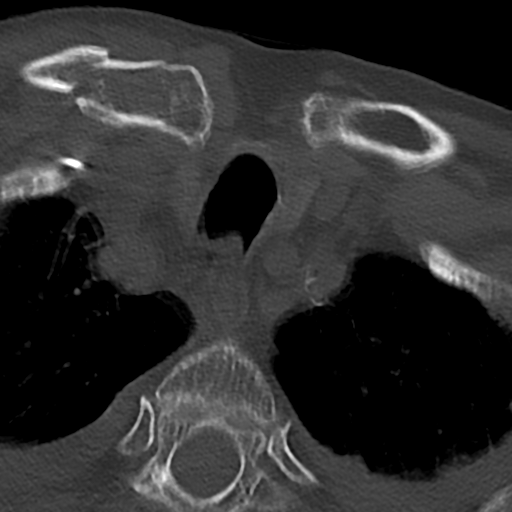
[im 29/93  bone]
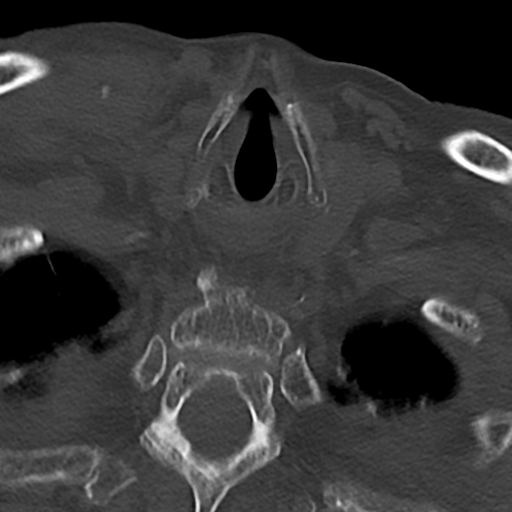
[im 43/93  bone]
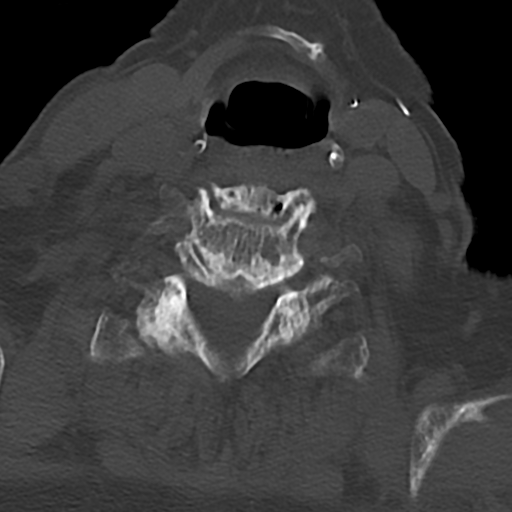
[im 50/93  soft-tissue]
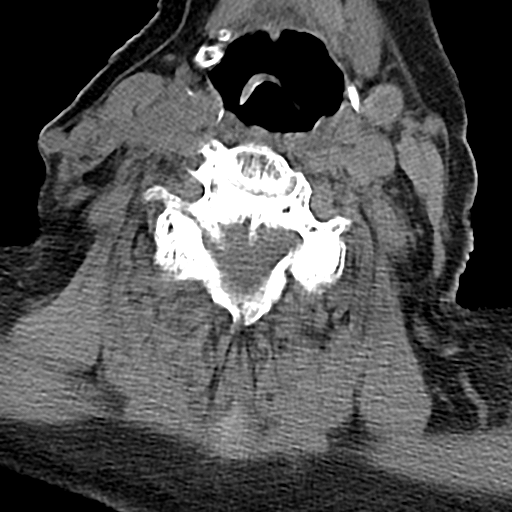
[im 50/93  bone]
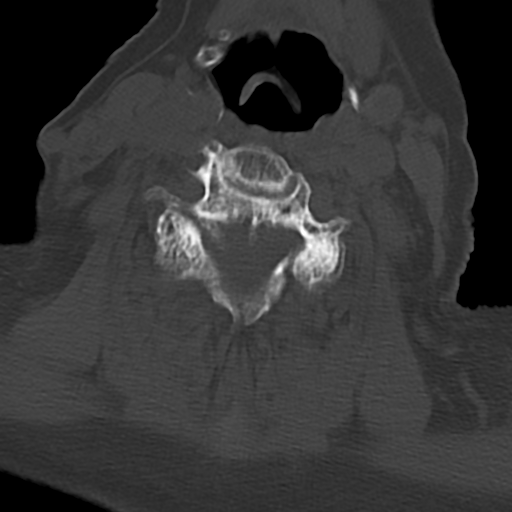
[im 64/93  bone]
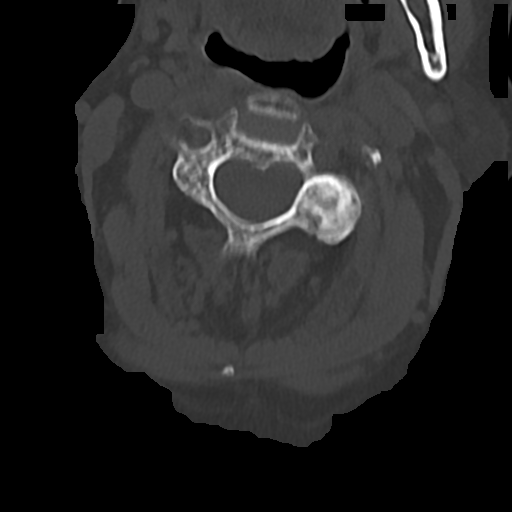
[im 71/93  bone]
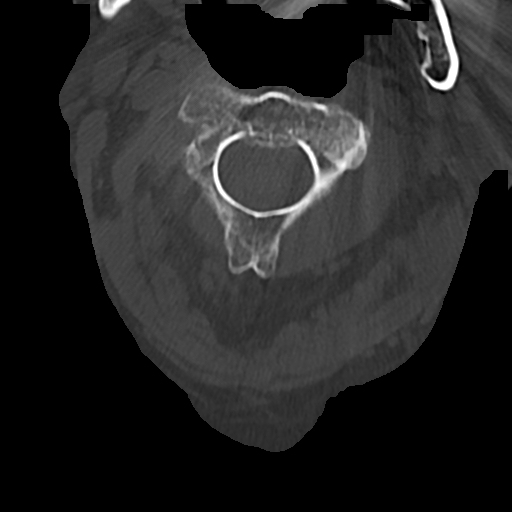
[im 85/93  bone]
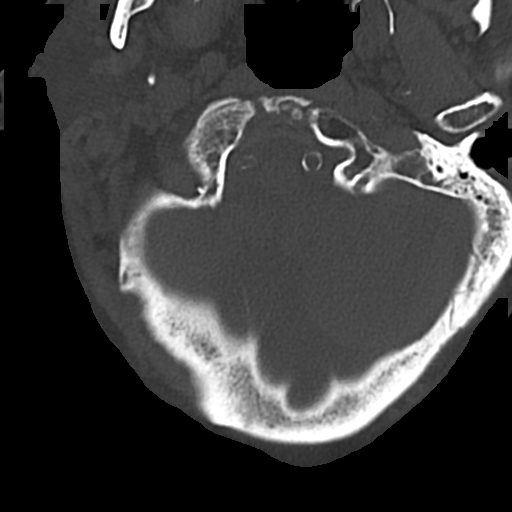

[Series 8: sag bone · sagittal · 0.40mm/px · 5 of 80 slices shown, 6 images]
[im 27/80  bone]
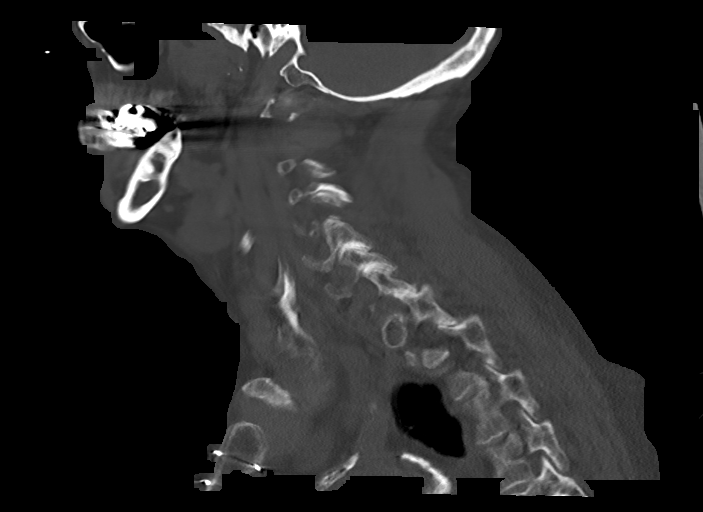
[im 33/80  bone]
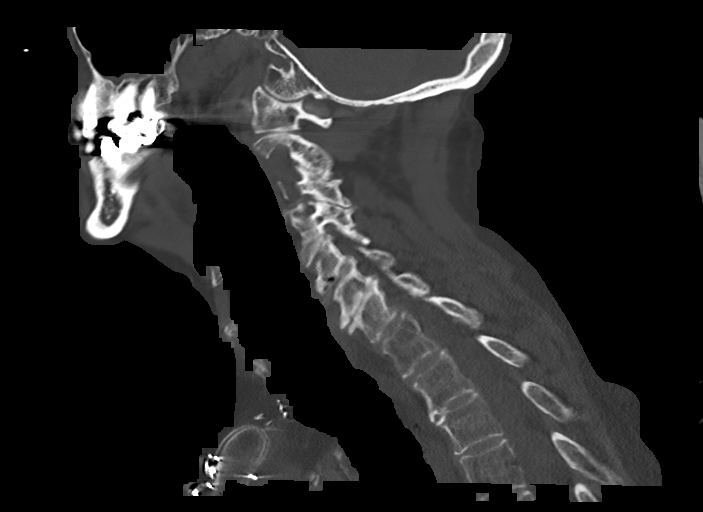
[im 40/80  soft-tissue]
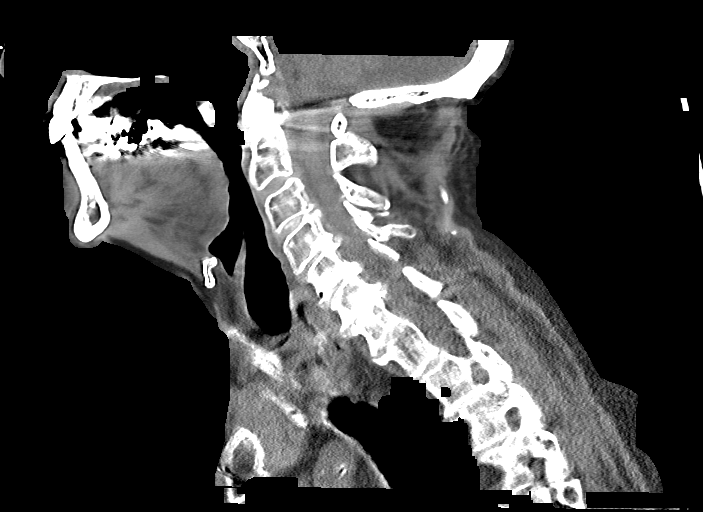
[im 40/80  bone]
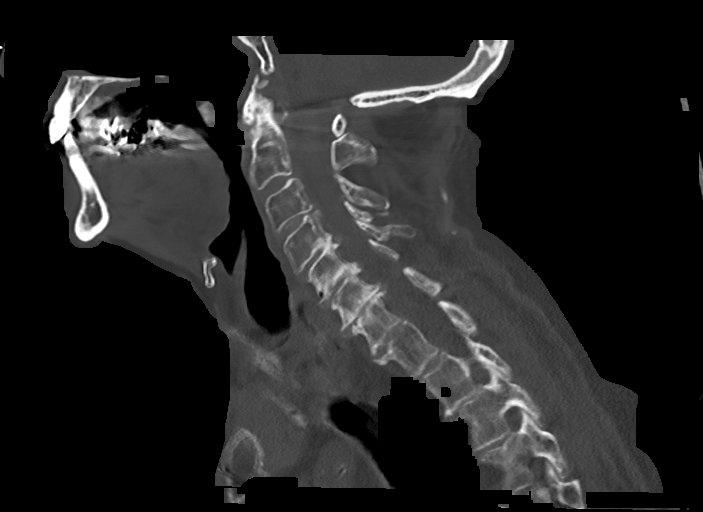
[im 47/80  bone]
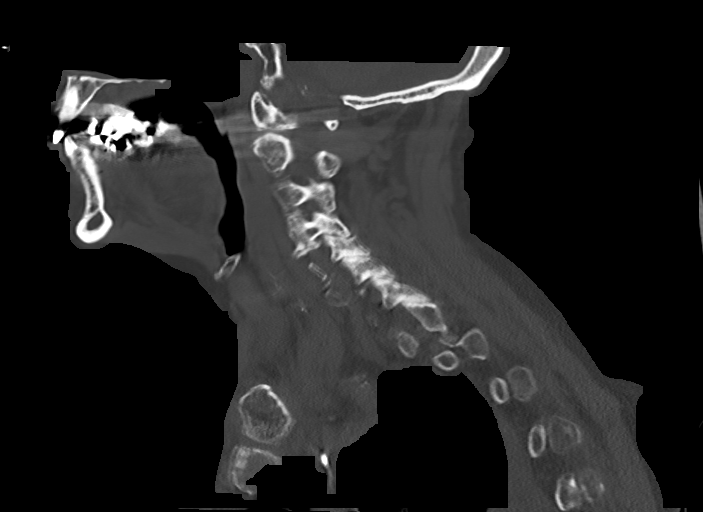
[im 53/80  bone]
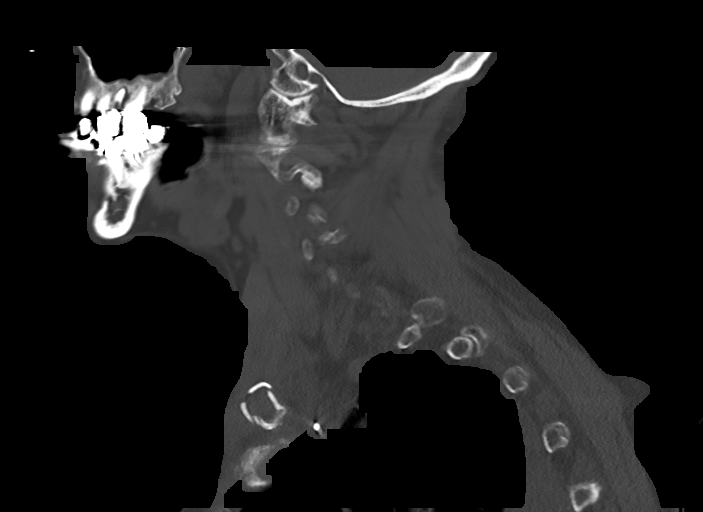

[13 of 28 positions shown; findings below may reference images not displayed]

FINDINGS: CT HEAD FINDINGS

Brain: No evidence of acute infarction, hemorrhage, hydrocephalus,
extra-axial collection or mass lesion/mass effect.

Old caudate nucleus head lacunar infarcts. Age-appropriate volume
loss. Mild chronic microvascular ischemic change.

Vascular: No hyperdense vessel or unexpected calcification.

Skull: Normal. Negative for fracture or focal lesion.

Sinuses/Orbits: Globes and orbits are unremarkable. Sinuses
essentially clear. Partial right middle ear cavity opacification,
unchanged from the prior CT.

Other: Minimal right forehead scalp contusion.

CT CERVICAL SPINE FINDINGS

Alignment: Normal.

Skull base and vertebrae: No acute fracture. No primary bone lesion
or focal pathologic process.

Soft tissues and spinal canal: No prevertebral fluid or swelling. No
visible canal hematoma.

Disc levels: Moderate loss of disc height from C4-C5 through C7-T1
with disc bulging. On the sagittal reconstructed images, a central
disc protrusion is suggested at the C4-C5 level.

Upper chest: Acute fracture of the medial right clavicle, mildly
displaced by approximately 4-5 mm. No other fractures.

Small bilateral pleural effusions. Interstitial thickening noted in
the upper lungs.

Other: None.
IMPRESSION: HEAD CT

1. No acute intracranial abnormalities.
2. No skull fracture.

CERVICAL CT

1. No vertebral fracture or malalignment.
2. Fracture of the medial right clavicle, mildly displaced by 4-5
mm.
3. Small bilateral pleural effusions. Interstitial thickening in the
visualized upper lobes which may reflect interstitial edema.
